# Patient Record
Sex: Male | Born: 1935
Health system: Southern US, Community
[De-identification: ages and names within clinical notes are randomized; demographics above are authoritative.]

## PROBLEM LIST (undated history)

## (undated) DIAGNOSIS — J449 Chronic obstructive pulmonary disease, unspecified: Secondary | ICD-10-CM

## (undated) DIAGNOSIS — J320 Chronic maxillary sinusitis: Secondary | ICD-10-CM

## (undated) DIAGNOSIS — K439 Ventral hernia without obstruction or gangrene: Secondary | ICD-10-CM

## (undated) DIAGNOSIS — F32A Depression, unspecified: Secondary | ICD-10-CM

## (undated) DIAGNOSIS — T7840XA Allergy, unspecified, initial encounter: Secondary | ICD-10-CM

## (undated) DIAGNOSIS — F419 Anxiety disorder, unspecified: Secondary | ICD-10-CM

## (undated) DIAGNOSIS — D649 Anemia, unspecified: Secondary | ICD-10-CM

## (undated) DIAGNOSIS — F0280 Dementia in other diseases classified elsewhere without behavioral disturbance: Secondary | ICD-10-CM

## (undated) DIAGNOSIS — F329 Major depressive disorder, single episode, unspecified: Secondary | ICD-10-CM

## (undated) DIAGNOSIS — E785 Hyperlipidemia, unspecified: Secondary | ICD-10-CM

## (undated) DIAGNOSIS — N189 Chronic kidney disease, unspecified: Secondary | ICD-10-CM

## (undated) DIAGNOSIS — N289 Disorder of kidney and ureter, unspecified: Secondary | ICD-10-CM

## (undated) DIAGNOSIS — I10 Essential (primary) hypertension: Secondary | ICD-10-CM

## (undated) DIAGNOSIS — N400 Enlarged prostate without lower urinary tract symptoms: Secondary | ICD-10-CM

## (undated) DIAGNOSIS — G4730 Sleep apnea, unspecified: Secondary | ICD-10-CM

## (undated) DIAGNOSIS — R609 Edema, unspecified: Secondary | ICD-10-CM

## (undated) DIAGNOSIS — G309 Alzheimer's disease, unspecified: Secondary | ICD-10-CM

## (undated) HISTORY — DX: Sleep apnea, unspecified: G47.30

## (undated) HISTORY — DX: Disorder of kidney and ureter, unspecified: N28.9

## (undated) HISTORY — DX: Anxiety disorder, unspecified: F41.9

## (undated) HISTORY — DX: Dementia in other diseases classified elsewhere, unspecified severity, without behavioral disturbance, psychotic disturbance, mood disturbance, and anxiety: F02.80

## (undated) HISTORY — DX: Anemia, unspecified: D64.9

## (undated) HISTORY — DX: Hyperlipidemia, unspecified: E78.5

## (undated) HISTORY — DX: Alzheimer's disease, unspecified: G30.9

## (undated) HISTORY — DX: Allergy, unspecified, initial encounter: T78.40XA

## (undated) HISTORY — DX: Edema, unspecified: R60.9

## (undated) HISTORY — DX: Depression, unspecified: F32.A

## (undated) HISTORY — DX: Benign prostatic hyperplasia without lower urinary tract symptoms: N40.0

## (undated) HISTORY — DX: Chronic maxillary sinusitis: J32.0

## (undated) HISTORY — DX: Major depressive disorder, single episode, unspecified: F32.9

## (undated) HISTORY — DX: Hypercalcemia: E83.52

## (undated) HISTORY — DX: Ventral hernia without obstruction or gangrene: K43.9

## (undated) HISTORY — PX: HERNIA REPAIR: SHX51

---

## 2000-07-08 HISTORY — PX: EXPLORATORY LAPAROTOMY: SUR591

## 2001-06-10 ENCOUNTER — Encounter: Payer: Self-pay | Admitting: Emergency Medicine

## 2001-06-11 ENCOUNTER — Inpatient Hospital Stay (HOSPITAL_COMMUNITY): Admission: EM | Admit: 2001-06-11 | Discharge: 2001-06-23 | Payer: Self-pay | Admitting: Emergency Medicine

## 2001-06-12 ENCOUNTER — Encounter: Payer: Self-pay | Admitting: Internal Medicine

## 2001-06-14 ENCOUNTER — Encounter: Payer: Self-pay | Admitting: Internal Medicine

## 2001-06-15 ENCOUNTER — Encounter: Payer: Self-pay | Admitting: Internal Medicine

## 2001-06-16 ENCOUNTER — Encounter: Payer: Self-pay | Admitting: Internal Medicine

## 2001-06-17 ENCOUNTER — Encounter: Payer: Self-pay | Admitting: Internal Medicine

## 2002-09-20 ENCOUNTER — Ambulatory Visit (HOSPITAL_COMMUNITY): Admission: RE | Admit: 2002-09-20 | Discharge: 2002-09-20 | Payer: Self-pay | Admitting: Gastroenterology

## 2002-09-20 ENCOUNTER — Encounter (INDEPENDENT_AMBULATORY_CARE_PROVIDER_SITE_OTHER): Payer: Self-pay

## 2003-08-17 ENCOUNTER — Ambulatory Visit (HOSPITAL_COMMUNITY): Admission: RE | Admit: 2003-08-17 | Discharge: 2003-08-17 | Payer: Self-pay | Admitting: General Surgery

## 2003-08-17 ENCOUNTER — Encounter (INDEPENDENT_AMBULATORY_CARE_PROVIDER_SITE_OTHER): Payer: Self-pay | Admitting: Specialist

## 2003-08-17 ENCOUNTER — Ambulatory Visit (HOSPITAL_BASED_OUTPATIENT_CLINIC_OR_DEPARTMENT_OTHER): Admission: RE | Admit: 2003-08-17 | Discharge: 2003-08-17 | Payer: Self-pay | Admitting: General Surgery

## 2009-10-04 ENCOUNTER — Inpatient Hospital Stay (HOSPITAL_COMMUNITY): Admission: RE | Admit: 2009-10-04 | Discharge: 2009-10-16 | Payer: Self-pay | Admitting: General Surgery

## 2010-05-28 ENCOUNTER — Encounter: Admission: RE | Admit: 2010-05-28 | Discharge: 2010-05-28 | Payer: Self-pay | Admitting: Internal Medicine

## 2010-09-26 LAB — GLUCOSE, CAPILLARY
Glucose-Capillary: 111 mg/dL — ABNORMAL HIGH (ref 70–99)
Glucose-Capillary: 114 mg/dL — ABNORMAL HIGH (ref 70–99)
Glucose-Capillary: 116 mg/dL — ABNORMAL HIGH (ref 70–99)
Glucose-Capillary: 122 mg/dL — ABNORMAL HIGH (ref 70–99)
Glucose-Capillary: 122 mg/dL — ABNORMAL HIGH (ref 70–99)
Glucose-Capillary: 132 mg/dL — ABNORMAL HIGH (ref 70–99)
Glucose-Capillary: 134 mg/dL — ABNORMAL HIGH (ref 70–99)
Glucose-Capillary: 134 mg/dL — ABNORMAL HIGH (ref 70–99)
Glucose-Capillary: 135 mg/dL — ABNORMAL HIGH (ref 70–99)
Glucose-Capillary: 137 mg/dL — ABNORMAL HIGH (ref 70–99)
Glucose-Capillary: 144 mg/dL — ABNORMAL HIGH (ref 70–99)
Glucose-Capillary: 145 mg/dL — ABNORMAL HIGH (ref 70–99)
Glucose-Capillary: 146 mg/dL — ABNORMAL HIGH (ref 70–99)
Glucose-Capillary: 149 mg/dL — ABNORMAL HIGH (ref 70–99)
Glucose-Capillary: 153 mg/dL — ABNORMAL HIGH (ref 70–99)
Glucose-Capillary: 157 mg/dL — ABNORMAL HIGH (ref 70–99)
Glucose-Capillary: 159 mg/dL — ABNORMAL HIGH (ref 70–99)
Glucose-Capillary: 160 mg/dL — ABNORMAL HIGH (ref 70–99)
Glucose-Capillary: 160 mg/dL — ABNORMAL HIGH (ref 70–99)
Glucose-Capillary: 161 mg/dL — ABNORMAL HIGH (ref 70–99)
Glucose-Capillary: 177 mg/dL — ABNORMAL HIGH (ref 70–99)
Glucose-Capillary: 185 mg/dL — ABNORMAL HIGH (ref 70–99)
Glucose-Capillary: 194 mg/dL — ABNORMAL HIGH (ref 70–99)
Glucose-Capillary: 195 mg/dL — ABNORMAL HIGH (ref 70–99)
Glucose-Capillary: 203 mg/dL — ABNORMAL HIGH (ref 70–99)
Glucose-Capillary: 214 mg/dL — ABNORMAL HIGH (ref 70–99)
Glucose-Capillary: 230 mg/dL — ABNORMAL HIGH (ref 70–99)
Glucose-Capillary: 235 mg/dL — ABNORMAL HIGH (ref 70–99)
Glucose-Capillary: 238 mg/dL — ABNORMAL HIGH (ref 70–99)
Glucose-Capillary: 271 mg/dL — ABNORMAL HIGH (ref 70–99)
Glucose-Capillary: 64 mg/dL — ABNORMAL LOW (ref 70–99)
Glucose-Capillary: 81 mg/dL (ref 70–99)
Glucose-Capillary: 87 mg/dL (ref 70–99)

## 2010-09-26 LAB — URINALYSIS, MICROSCOPIC ONLY
Bilirubin Urine: NEGATIVE
Hgb urine dipstick: NEGATIVE
Nitrite: NEGATIVE
Specific Gravity, Urine: 1.014 (ref 1.005–1.030)
Urobilinogen, UA: 0.2 mg/dL (ref 0.0–1.0)
pH: 5 (ref 5.0–8.0)

## 2010-09-26 LAB — BLOOD GAS, ARTERIAL
Acid-base deficit: 0.9 mmol/L (ref 0.0–2.0)
Bicarbonate: 23.7 mEq/L (ref 20.0–24.0)
Bicarbonate: 28.5 mEq/L — ABNORMAL HIGH (ref 20.0–24.0)
Drawn by: 328211
O2 Content: 2 L/min
O2 Saturation: 95.9 %
Patient temperature: 98.6
Patient temperature: 98.6
TCO2: 25 mmol/L (ref 0–100)
pH, Arterial: 7.335 — ABNORMAL LOW (ref 7.350–7.450)
pO2, Arterial: 82.1 mmHg (ref 80.0–100.0)

## 2010-09-26 LAB — CBC
HCT: 33.8 % — ABNORMAL LOW (ref 39.0–52.0)
HCT: 35.8 % — ABNORMAL LOW (ref 39.0–52.0)
Hemoglobin: 12.1 g/dL — ABNORMAL LOW (ref 13.0–17.0)
Hemoglobin: 12.3 g/dL — ABNORMAL LOW (ref 13.0–17.0)
MCV: 95.3 fL (ref 78.0–100.0)
MCV: 95.7 fL (ref 78.0–100.0)
Platelets: 174 10*3/uL (ref 150–400)
RBC: 3.23 MIL/uL — ABNORMAL LOW (ref 4.22–5.81)
RBC: 3.35 MIL/uL — ABNORMAL LOW (ref 4.22–5.81)
RBC: 3.54 MIL/uL — ABNORMAL LOW (ref 4.22–5.81)
RBC: 3.74 MIL/uL — ABNORMAL LOW (ref 4.22–5.81)
RDW: 14.1 % (ref 11.5–15.5)
WBC: 10.7 10*3/uL — ABNORMAL HIGH (ref 4.0–10.5)
WBC: 12.1 10*3/uL — ABNORMAL HIGH (ref 4.0–10.5)
WBC: 5.2 10*3/uL (ref 4.0–10.5)
WBC: 7.2 10*3/uL (ref 4.0–10.5)
WBC: 7.9 10*3/uL (ref 4.0–10.5)

## 2010-09-26 LAB — BASIC METABOLIC PANEL
BUN: 36 mg/dL — ABNORMAL HIGH (ref 6–23)
CO2: 28 mEq/L (ref 19–32)
Calcium: 8.3 mg/dL — ABNORMAL LOW (ref 8.4–10.5)
Calcium: 8.7 mg/dL (ref 8.4–10.5)
Calcium: 8.7 mg/dL (ref 8.4–10.5)
Calcium: 8.8 mg/dL (ref 8.4–10.5)
Calcium: 9.5 mg/dL (ref 8.4–10.5)
Chloride: 102 mEq/L (ref 96–112)
Chloride: 102 mEq/L (ref 96–112)
Chloride: 105 mEq/L (ref 96–112)
Chloride: 106 mEq/L (ref 96–112)
Chloride: 108 mEq/L (ref 96–112)
Creatinine, Ser: 1.74 mg/dL — ABNORMAL HIGH (ref 0.4–1.5)
Creatinine, Ser: 2.18 mg/dL — ABNORMAL HIGH (ref 0.4–1.5)
GFR calc Af Amer: 33 mL/min — ABNORMAL LOW (ref >60)
GFR calc Af Amer: 36 mL/min — ABNORMAL LOW (ref >60)
GFR calc Af Amer: 47 mL/min — ABNORMAL LOW (ref >60)
GFR calc Af Amer: 58 mL/min — ABNORMAL LOW (ref >60)
GFR calc Af Amer: >60 mL/min (ref >60)
GFR calc Af Amer: >60 mL/min (ref >60)
GFR calc non Af Amer: 27 mL/min — ABNORMAL LOW (ref >60)
GFR calc non Af Amer: 39 mL/min — ABNORMAL LOW (ref >60)
GFR calc non Af Amer: 54 mL/min — ABNORMAL LOW (ref >60)
Glucose, Bld: 114 mg/dL — ABNORMAL HIGH (ref 70–99)
Glucose, Bld: 123 mg/dL — ABNORMAL HIGH (ref 70–99)
Glucose, Bld: 132 mg/dL — ABNORMAL HIGH (ref 70–99)
Glucose, Bld: 137 mg/dL — ABNORMAL HIGH (ref 70–99)
Glucose, Bld: 55 mg/dL — ABNORMAL LOW (ref 70–99)
Potassium: 3.8 mEq/L (ref 3.5–5.1)
Potassium: 4 mEq/L (ref 3.5–5.1)
Potassium: 4.1 mEq/L (ref 3.5–5.1)
Potassium: 4.3 mEq/L (ref 3.5–5.1)
Potassium: 4.6 mEq/L (ref 3.5–5.1)
Potassium: 4.9 mEq/L (ref 3.5–5.1)
Sodium: 136 mEq/L (ref 135–145)
Sodium: 138 mEq/L (ref 135–145)
Sodium: 139 mEq/L (ref 135–145)
Sodium: 140 mEq/L (ref 135–145)
Sodium: 141 mEq/L (ref 135–145)

## 2010-09-26 LAB — BRAIN NATRIURETIC PEPTIDE: Pro B Natriuretic peptide (BNP): 39 pg/mL (ref 0.0–100.0)

## 2010-09-30 LAB — GLUCOSE, CAPILLARY
Glucose-Capillary: 172 mg/dL — ABNORMAL HIGH (ref 70–99)
Glucose-Capillary: 191 mg/dL — ABNORMAL HIGH (ref 70–99)
Glucose-Capillary: 193 mg/dL — ABNORMAL HIGH (ref 70–99)
Glucose-Capillary: 224 mg/dL — ABNORMAL HIGH (ref 70–99)

## 2010-09-30 LAB — DIFFERENTIAL
Basophils Relative: 1 % (ref 0–1)
Eosinophils Relative: 5 % (ref 0–5)
Monocytes Absolute: 0.8 10*3/uL (ref 0.1–1.0)
Monocytes Relative: 11 % (ref 3–12)
Neutro Abs: 3.9 10*3/uL (ref 1.7–7.7)

## 2010-09-30 LAB — COMPREHENSIVE METABOLIC PANEL
AST: 20 U/L (ref 0–37)
Albumin: 3.8 g/dL (ref 3.5–5.2)
Alkaline Phosphatase: 39 U/L (ref 39–117)
BUN: 28 mg/dL — ABNORMAL HIGH (ref 6–23)
Chloride: 100 mEq/L (ref 96–112)
GFR calc Af Amer: 57 mL/min — ABNORMAL LOW (ref >60)
Potassium: 3.7 mEq/L (ref 3.5–5.1)
Sodium: 140 mEq/L (ref 135–145)
Total Protein: 7 g/dL (ref 6.0–8.3)

## 2010-09-30 LAB — MRSA PCR SCREENING: MRSA by PCR: NEGATIVE

## 2010-09-30 LAB — CBC
HCT: 40.5 % (ref 39.0–52.0)
Platelets: 164 10*3/uL (ref 150–400)
RBC: 3.82 MIL/uL — ABNORMAL LOW (ref 4.22–5.81)
RDW: 13.7 % (ref 11.5–15.5)
WBC: 11.1 10*3/uL — ABNORMAL HIGH (ref 4.0–10.5)
WBC: 7.5 10*3/uL (ref 4.0–10.5)

## 2010-09-30 LAB — ABO/RH: ABO/RH(D): A POS

## 2010-09-30 LAB — BASIC METABOLIC PANEL
Calcium: 8.9 mg/dL (ref 8.4–10.5)
Creatinine, Ser: 1.8 mg/dL — ABNORMAL HIGH (ref 0.4–1.5)
GFR calc Af Amer: 45 mL/min — ABNORMAL LOW (ref >60)
GFR calc non Af Amer: 37 mL/min — ABNORMAL LOW (ref >60)

## 2010-09-30 LAB — TYPE AND SCREEN

## 2010-11-23 NOTE — Op Note (Signed)
   NAME:  Ian [name], Ian [name] NO.:  [id number]   MEDICAL RECORD NO.:  [id number]                   PATIENT TYPE:  AMB   LOCATION:  ENDO                                 FACILITY:  [facility]   PHYSICIAN:  Earle Gell, M.D.                DATE OF BIRTH:  April 23, 1936   DATE OF PROCEDURE:  09/20/2002  DATE OF DISCHARGE:                                 OPERATIVE REPORT   REFERRING PHYSICIAN:  Sherren Kerns. Pamella Pert, M.D.   PROCEDURE:  Colonoscopy with rectal polypectomy.   PROCEDURE INDICATION:  [name] is a 75 year old male, born [birth year].  [name] is scheduled to undergo his first screening colonoscopy  with polypectomy to prevent colon cancer.   ENDOSCOPIST:  Garlan Fair, M.D.   PREMEDICATION:  Versed 5 mg, Demerol 50 mg.   ENDOSCOPE:  Olympus adult colonoscope.   DESCRIPTION OF PROCEDURE:  After obtaining informed consent, [name] was  placed in the left lateral decubitus position.  I administered intravenous  Demerol and intravenous Versed to achieve conscious sedation for the  procedure.  The patient's blood pressure, oxygen saturation, and cardiac  rhythm were monitored throughout the procedure and documented in the medical  record.   Anal inspection was normal.  Digital rectal examination revealed a small,  nonnodular prostate.  The Olympus adult colonoscope was introduced into the  rectum and easily advanced to the cecum.  Colonic preparation for the exam  today was excellent.   RECTUM:  From the proximal rectum, a 1 mm sessile polyp was removed with the  hot biopsy forceps.  SIGMOID COLON AND DESCENDING COLON:  Normal.  SPLENIC FLEXURE:  Normal.  TRANSVERSE COLON:  Normal.  HEPATIC FLEXURE:  Normal.  ASCENDING COLON:  Normal.  CECUM AND ILEOCECAL VALVE:  Normal.    ASSESSMENT:  A 1 mm sessile polyp was removed from the proximal rectum;  otherwise, normal proctocolonoscopy to the cecum.                 Earle Gell, M.D.    MJ/MEDQ  D:  09/20/2002  T:  09/20/2002  Job:  [id number]   cc:   Sherren Kerns. Pamella Pert, M.D.  [address]  [place]  [place] [address]  Fax: [phone]

## 2010-11-23 NOTE — Discharge Summary (Signed)
[place]  Patient:    Ian [name], Ian [name] Visit Number: [id number] MRN: [id number]          Service Type: MED Location: [phone] Attending Physician:  Harl Bowie Dictated by:   Coralie Keens, M.D. Admit Date:  06/10/2001 Discharge Date: 06/23/2001                             Discharge Summary  SUMMARY OF HISTORY:  Ian [name] is a 75 year old gentleman who was admitted by the medical service and hospitalists for exacerbation of COPD on June 10, 2001.  He was treated for this by the hospitalists with breathing treatments, IV antibiotics, steroids, etc.  During this time, he developed an ileus and increasing abdominal distention.  Therefore, on June 16, 2001 I was consulted from a general surgical perspective.  At this point, the patient was having bowel movements and flatus.  His abdomen was soft and only mildly distended with minimal tenderness.  He had had abdominal x-rays showing the possibility of a bowel obstruction, although I thought this was more secondary to an ileus.  At this point, I placed him on Reglan and Dulcolax suppository and he began having bowel movements.  His abdominal x-rays, however, continued to be read as continued bowel obstruction, which was read by them as high grade.  Because of his slow improvement, a CAT scan was ordered and on December 12 he underwent a CAT scan, which showed him to have, by the radiologists reading, a high-grade distal small bowel obstruction with ischemic changes.  Based on these findings, I had no choice but to take the patient to the operating room for exploratory laparotomy.  At exploration, the patient was found to have an ileus with no evidence of bowel obstruction.  He did well postoperatively and was taken to a regular surgical floor.  The hospitalists continued to follow him from a pulmonary standpoint, at which he had no problems.  His ileus slowly began to resolve and his  NG was removed and he was placed on clear liquids on postoperative day #3.  By postoperative day #4, he was having bowel movements and was tolerating liquids.  At this point, his PCA was removed.  He was given an enema to remove some of the large stool in his colon.  He continued to improve from this point and was ambulating well, and by December 17 he was doing very well from both the pulmonary and abdominal standpoint.  His incision was healing well and he was having regular bowel movements.  He was tolerating a regular diet.  The decision was made, after review from the hospitalists, to discharge the patient to home.  DISCHARGE DIAGNOSES: 1. Chronic obstructive pulmonary disease exacerbation. 2. Ileus, status post exploratory laparotomy.  DISCHARGE ACTIVITY:  He is to do no heavy lifting for approximately five more weeks.  DISCHARGE MEDICATIONS:  Medications include his current home medications, which included enalapril, Paxil, and Lipitor.  He is now on Norvasc, ______. They have got him on a prednisone taper and Atrovent.  He is to hold his Glucophage.  DISCHARGE DIET:  He will continue a low-fat ADA diet.  FOLLOWUP:  He will follow up in my office in seven days post discharge.  He will follow up with Dr. Pamella Pert, his primary care physician, on Friday, June 26, 2001. Dictated by:   Coralie Keens, M.D. Attending Physician:  Harl Bowie DD:  07/02/01 TD:  07/03/01 Job: [id number]

## 2010-11-23 NOTE — Op Note (Signed)
[place]  Patient:    Ian [name], Ian [name] Visit Number: [id number] MRN: [id number]          Service Type: MED Location: [phone] Attending Physician:  Harl Bowie Dictated by:   Coralie Keens, M.D. Proc. Date: 06/18/01 Admit Date:  06/10/2001                             Operative Report  PREOPERATIVE DIAGNOSIS:  Small bowel obstruction.  POSTOPERATIVE DIAGNOSIS:  Ileus.  PROCEDURE:  Exploratory laparotomy.  SURGEON: Coralie Keens, M.D.  ANESTHESIA:  General endotracheal anesthesia.  INDICATION:  Ian [name] is a 75 year old gentleman who was admitted with an exacerbation of COPD on December 5.  He developed abdominal distention after this and had a CT scan of the abdomen on December 11, which showed findings suspicious for a high-grade bowel obstruction with ischemic changes of the bowel.  Given this finding, a decision made to proceed to the operating room for exploration.  FINDINGS:  The patient was found to have dilated small bowel but no evidence of obstruction.  DESCRIPTION OF PROCEDURE:  The patient was brought to the operating room, identified as Ian [name].  He was placed supine upon the operating table and general anesthesia was induced.  His abdomen was then prepped and draped in the usual sterile fashion.  Using a #10 blade, a midline incision was then created.  The incision was carried down through the fascia with the electrocautery.  The peritoneum was then opened the entire length of the incision.  The patient was found to have a small umbilical hernia with omentum stuck in this.  Electrocautery was used to free this up.  The abdomen was then explored.  The patient was found to have a mild amount of ascitic fluid.  The small bowel appeared dilated in the midportion.  Small bowel was then completely visualized going from the ligament of Treitz to the terminal ileum. No evidence of adhesions or obstruction was  identified.  The cecum, ascending, transverse, descending, and sigmoid colon were then examined, and no obstruction was found here, either.  The liver was also normal.  Again the small was run, and again no obstruction was identified.  At that point the midline fascia was closed with running #1 Prolene suture.  The umbilical hernia defect was repaired with the closure.  The subcutaneous layer was then irrigated and closed with skin staples.  The patient tolerated the procedure well.  All sponge, needle, and instrument counts were correct at the end of the procedure.  The patient was then extubated in the operating room and taken in stable condition to the recovery room. Dictated by:   Coralie Keens, M.D. Attending Physician:  Harl Bowie DD:  06/18/01 TD:  06/18/01 Job: [id number]

## 2011-06-28 DIAGNOSIS — H5010 Unspecified exotropia: Secondary | ICD-10-CM | POA: Insufficient documentation

## 2011-06-28 DIAGNOSIS — H348392 Tributary (branch) retinal vein occlusion, unspecified eye, stable: Secondary | ICD-10-CM | POA: Insufficient documentation

## 2011-10-21 ENCOUNTER — Other Ambulatory Visit: Payer: Self-pay | Admitting: Internal Medicine

## 2011-10-21 ENCOUNTER — Ambulatory Visit
Admission: RE | Admit: 2011-10-21 | Discharge: 2011-10-21 | Disposition: A | Discharge status: Home / Self Care | Payer: Medicare Other | Source: Ambulatory Visit | Attending: Internal Medicine | Admitting: Internal Medicine

## 2011-10-21 DIAGNOSIS — R05 Cough: Secondary | ICD-10-CM

## 2011-10-28 ENCOUNTER — Encounter (HOSPITAL_COMMUNITY): Payer: Self-pay | Admitting: *Deleted

## 2011-10-28 ENCOUNTER — Emergency Department (HOSPITAL_COMMUNITY)
Admission: EM | Admit: 2011-10-28 | Discharge: 2011-10-28 | Disposition: A | Discharge status: Home / Self Care | Payer: Medicare Other | Source: Home / Self Care | Attending: Family Medicine | Admitting: Family Medicine

## 2011-10-28 ENCOUNTER — Emergency Department (INDEPENDENT_AMBULATORY_CARE_PROVIDER_SITE_OTHER): Payer: Medicare Other

## 2011-10-28 DIAGNOSIS — J45909 Unspecified asthma, uncomplicated: Secondary | ICD-10-CM

## 2011-10-28 HISTORY — DX: Essential (primary) hypertension: I10

## 2011-10-28 HISTORY — DX: Chronic obstructive pulmonary disease, unspecified: J44.9

## 2011-10-28 MED ORDER — FLUTICASONE PROPIONATE 50 MCG/ACT NA SUSP: 1.0000 | Freq: Two times a day (BID) | NASAL | Status: DC

## 2011-10-28 MED ORDER — DEXTROMETHORPHAN POLISTIREX 30 MG/5ML PO LQCR: 60.0000 mg | ORAL | Status: AC | PRN

## 2011-10-28 MED ORDER — CETIRIZINE HCL 10 MG PO TABS: 10.0000 mg | ORAL_TABLET | Freq: Every day | ORAL | Status: DC

## 2011-10-28 NOTE — ED Provider Notes (Signed)
History     CSN: [id number]  Arrival date & time 10/28/11  1300   First MD Initiated Contact with Patient 10/28/11 1520      Chief Complaint  Patient presents with  . Cough    (Consider location/radiation/quality/duration/timing/severity/associated sxs/prior treatment) Patient is a 76 y.o. male presenting with cough. The history is provided by the patient and the spouse.  Cough This is a new problem. The current episode started more than 1 week ago (given z-pak by lmd, sx continue.). The problem has not changed since onset.The cough is non-productive. There has been no fever. Associated symptoms include rhinorrhea. He is not a smoker. His past medical history is significant for COPD.    Past Medical History  Diagnosis Date  . Diabetes mellitus   . Hypertension   . COPD (chronic obstructive pulmonary disease)     Past Surgical History  Procedure Date  . Hernia repair     History reviewed. No pertinent family history.  History  Substance Use Topics  . Smoking status: Not on file  . Smokeless tobacco: Not on file  . Alcohol Use:       Review of Systems  Constitutional: Negative.   HENT: Positive for congestion, rhinorrhea, sneezing and postnasal drip.   Respiratory: Positive for cough.   Cardiovascular: Negative.   Gastrointestinal: Negative.     Allergies  Review of patient's allergies indicates no known allergies.  Home Medications   Current Outpatient Rx  Name Route Sig Dispense Refill  . ALBUTEROL SULFATE HFA 108 (90 BASE) MCG/ACT IN AERS Inhalation Inhale 2 puffs into the lungs every 6 (six) hours as needed.    Marland Kitchen BENZONATATE 100 MG PO CAPS Oral Take 100 mg by mouth 3 (three) times daily as needed.    Marland Kitchen GLIMEPIRIDE 4 MG PO TABS Oral Take 4 mg by mouth daily before breakfast.    . LISINOPRIL 5 MG PO TABS Oral Take 5 mg by mouth daily.    Marland Kitchen PAROXETINE HCL 20 MG PO TABS Oral Take 20 mg by mouth every morning.    . TRAZODONE HCL 150 MG PO TABS Oral Take 150  mg by mouth at bedtime.    Marland Kitchen CETIRIZINE HCL 10 MG PO TABS Oral Take 1 tablet (10 mg total) by mouth daily. One tab daily for allergies 30 tablet 1  . DEXTROMETHORPHAN POLISTIREX ER 30 MG/5ML PO LQCR Oral Take 10 mLs (60 mg total) by mouth as needed for cough. 89 mL 0  . FLUTICASONE PROPIONATE 50 MCG/ACT NA SUSP Nasal Place 1 spray into the nose 2 (two) times daily. 1 g 2    BP 146/67  Pulse 72  Temp(Src) 97.8 F (36.6 C) (Oral)  Resp 18  SpO2 98%  Physical Exam  Nursing note and vitals reviewed. Constitutional: He is oriented to person, place, and time. He appears well-developed and well-nourished.  HENT:  Head: Normocephalic.  Right Ear: External ear normal.  Left Ear: External ear normal.  Mouth/Throat: Oropharynx is clear and moist.  Eyes: Pupils are equal, round, and reactive to light.  Neck: Normal range of motion. Neck supple.  Cardiovascular: Normal rate and normal heart sounds.   Pulmonary/Chest: Effort normal and breath sounds normal.  Musculoskeletal: He exhibits no edema.  Lymphadenopathy:    He has no cervical adenopathy.  Neurological: He is alert and oriented to person, place, and time.  Skin: Skin is warm and dry.  Psychiatric: He has a normal mood and affect.    ED Course  Procedures (including critical care time)  Labs Reviewed - No data to display Dg Chest 2 View  10/28/2011  *RADIOLOGY REPORT*  Clinical Data: Cough.  Shortness of breath.  CHEST - 2 VIEW  Comparison: 10/21/2011  Findings: Heart size is normal.  Mediastinal shadows are unremarkable except for calcification of the thoracic aorta.  I think there is central bronchial thickening consistent with bronchitis.  No infiltrate, collapse or effusion.  No significant bony finding.  IMPRESSION: Bronchitis.  No consolidation or collapse.  Original Report Authenticated By: Jules Schick, M.D.     1. Bronchitis, allergic       MDM  X-rays reviewed and report per radiologist.         Billy Fischer, MD 10/28/11 [phone]

## 2011-10-28 NOTE — ED Notes (Signed)
pT  FINISHED  ROUND  OF  ANTI  BIOTICS  4  DAYS  AGO    HE WAS  ALSO  RX     ALBUTEROL   AS  WELL    AYT  THIS  TIME  HE  REPORTS  COUGH  AND      SHORTNESS OF  BREATH   WHICH  HE  REPORTS  HE  HAS  HAD  FOR 5  WEEKS  -  HE  IS  SITTING UPRIGHT ON EXAM TABLE  SPEAKING IN  COMPLETE  SENTANCES   WIFE  AT THE  BEDSIDE

## 2011-11-11 ENCOUNTER — Encounter: Payer: Self-pay | Admitting: Pulmonary Disease

## 2011-11-11 ENCOUNTER — Ambulatory Visit (INDEPENDENT_AMBULATORY_CARE_PROVIDER_SITE_OTHER): Payer: Medicare Other | Admitting: Pulmonary Disease

## 2011-11-11 VITALS — BP 160/78 | HR 66 | Temp 98.6°F | Ht 67.0 in | Wt 211.4 lb

## 2011-11-11 DIAGNOSIS — R05 Cough: Secondary | ICD-10-CM

## 2011-11-11 MED ORDER — MOMETASONE FUROATE 50 MCG/ACT NA SUSP: 2.0000 | Freq: Every day | NASAL | Status: DC

## 2011-11-11 NOTE — Patient Instructions (Signed)
Nasal irrigation (saline nasal spray) daily Nasonex two sprays each nostril daily for two weeks, then as needed Zyrtec daily for 5 days, then as needed Albuterol two puffs as needed for cough, wheeze, or chest congestion Finish prednisone prescription from Dr. Mariea Clonts Will schedule breathing test (PFT) Salt water gargles as needed Sugarless candy to help keep your mouth moist Sip water when you have urge to cough Follow up in 3 to 4 weeks

## 2011-11-11 NOTE — Assessment & Plan Note (Addendum)
This developed after recent respiratory infection.  He has some improvement with albuterol and prednisone therapy.  Spirometry today was suggestive of mixed restrictive and obstructive problem.  He has a reported history of COPD, and is a former smoker.  His recent chest xray showed bronchitic changes.  He does also have sinus congestion, seasonal allergies, and post-nasal drip.   I have advised him to use nasal irrigation and nasonex on a regular basis.  He can use salt water gargles, and sugarless candy for his throat.  He is to use zyrtec for the next several days, and then as needed.  He is to finish prednisone, and continue prn albuterol.  I don't think he needs additional antibiotics.  Will arrange for full PFT's to further assess obstructive/restrictive process.  Depending on results will determine if additional inhaler therapy or chest imaging studies are needed.  Of note is that he is on lisinopril.  If his symptoms do not improve with above interventions, then may need to consider trial off ACE inhibitor.  He does not have symptoms to suggest reflux.  If he cough persists, he may need further GI evaluation, but I don't think this is needed at present.

## 2011-11-11 NOTE — Progress Notes (Signed)
Chief Complaint  Patient presents with  . Advice Only    refer Dr. Mariea Clonts. Pt c/o cough w/ green-brown-white phlem, sob at rest and w/ exertion occasionally, wheezing, chest tightness    History of Present Illness: Ian [name] is a 76 y.o. male former smoker for evaluation of chronic cough.  His cough started about 6 weeks ago.  His wife got a cold, and he thinks he caught this from her.  His symptoms started in his sinuses.  He has been getting sinus congestion with post-nasal drip. He was also feverish when this started.  His feverish feeling resolved after he was treated with zithromax.  He also had some improvement with his cough, but not compete resolution.  He has been getting a globus sensation, but denies reflux.  His symptoms then settled into his chest.  He has been getting some wheeze, and gets more winded with activity.  His cough is usually dry, but occasionally productive of clear sputum.  He denies hemoptysis or chest pain.  He was started on albuterol and then prednisone.  These have helped.  He was seen several years ago by pulmonary doctor in [place], and told he has COPD and asthma.  He has used albuterol intermittently.  He also gets allergies, especially around grass and cats.  He was started on zyrtec and nasonex recently with some benefit.  He does not use these on a regular basis.    He has been using lisinopril.  He is not sure how long he has been using this.    There is no prior history of pneumonia or tuberculosis.  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  He quit smoking in 1990.   Past Medical History  Diagnosis Date  . Diabetes mellitus   . Hypertension   . COPD (chronic obstructive pulmonary disease)   . Kidney disease     mild  . Hyperlipidemia     Past Surgical History  Procedure Date  . Hernia repair     Current Outpatient Prescriptions on File Prior to Visit  Medication Sig Dispense Refill  . albuterol  (PROVENTIL HFA;VENTOLIN HFA) 108 (90 BASE) MCG/ACT inhaler Inhale 2 puffs into the lungs every 6 (six) hours as needed.      . benzonatate (TESSALON) 100 MG capsule Take 100 mg by mouth 3 (three) times daily as needed.      . cetirizine (ZYRTEC) 10 MG tablet Take 1 tablet (10 mg total) by mouth daily. One tab daily for allergies  30 tablet  1  . fluticasone (FLONASE) 50 MCG/ACT nasal spray Place 1 spray into the nose 2 (two) times daily.  1 g  2  . glimepiride (AMARYL) 4 MG tablet Take 4 mg by mouth daily before breakfast.      . lisinopril (PRINIVIL,ZESTRIL) 5 MG tablet Take 5 mg by mouth daily.      Marland Kitchen PARoxetine (PAXIL) 20 MG tablet Take 20 mg by mouth every morning.      . traZODone (DESYREL) 150 MG tablet Take 150 mg by mouth at bedtime.        No Known Allergies  family history includes Heart disease in his father and Pancreatic cancer in his sister.   reports that he quit smoking about 23 years ago. He does not have any smokeless tobacco history on file. He reports that he does not drink alcohol or use illicit drugs.  Review of Systems  Constitutional: Negative for fever, appetite  change and unexpected weight change.  HENT: Positive for sore throat and sneezing. Negative for ear pain, congestion, trouble swallowing, dental problem and sinus pressure.   Respiratory: Positive for cough and shortness of breath.   Cardiovascular: Positive for chest pain. Negative for palpitations and leg swelling.  Gastrointestinal: Negative for abdominal pain.  Musculoskeletal: Negative for joint swelling.  Skin: Positive for color change. Negative for rash.  Neurological: Negative for headaches.  Psychiatric/Behavioral: Positive for dysphoric mood. The patient is nervous/anxious.     Physical Exam: BP 160/78  Pulse 66  Temp(Src) 98.6 F (37 C) (Oral)  Ht 5\' 7"  (1.702 m)  Wt 211 lb 6.4 oz (95.89 kg)  BMI 33.11 kg/m2  SpO2 93% Body mass index is 33.11 kg/(m^2).  General - Obese, no  distress HEENT - PERRLA, EOMI, narrow nasal angles, clear nasal discharge, no oral exudate, no LAN, b/l hearing aides Cardiac - s1s2 regular, no murmur, pulses symmetric Chest - good air entry, no wheeze/rales/dullness Abdomen - obese, soft, nontender, no organomegaly Extremities - no e/c/c Neurologic - normal strength, CN intact Skin - no rashes Psychiatric - normal mood, behavior  Dg Chest 2 View  10/28/2011  *RADIOLOGY REPORT*   Clinical Data: Cough.  Shortness of breath.   CHEST - 2 VIEW   Comparison: 10/21/2011   Findings: Heart size is normal.  Mediastinal shadows are unremarkable except for calcification of the thoracic aorta.  I think there is central bronchial thickening consistent with bronchitis.  No infiltrate, collapse or effusion.  No significant bony finding.   IMPRESSION: Bronchitis.  No consolidation or collapse. Original Report Authenticated By: Jules Schick, M.D.   Spirometry 11/11/11>>FEV1 1.41 (46%), FEV1% 70    Assessment/Plan:  Outpatient Encounter Prescriptions as of 11/11/2011  Medication Sig Dispense Refill  . albuterol (PROVENTIL HFA;VENTOLIN HFA) 108 (90 BASE) MCG/ACT inhaler Inhale 2 puffs into the lungs every 6 (six) hours as needed.      . benzonatate (TESSALON) 100 MG capsule Take 100 mg by mouth 3 (three) times daily as needed.      . cetirizine (ZYRTEC) 10 MG tablet Take 1 tablet (10 mg total) by mouth daily. One tab daily for allergies  30 tablet  1  . fluticasone (FLONASE) 50 MCG/ACT nasal spray Place 1 spray into the nose 2 (two) times daily.  1 g  2  . glimepiride (AMARYL) 4 MG tablet Take 4 mg by mouth daily before breakfast.      . lisinopril (PRINIVIL,ZESTRIL) 5 MG tablet Take 5 mg by mouth daily.      Marland Kitchen PARoxetine (PAXIL) 20 MG tablet Take 20 mg by mouth every morning.      . predniSONE (DELTASONE) 5 MG tablet Taper as directed      . traZODone (DESYREL) 150 MG tablet Take 150 mg by mouth at bedtime.        Ian [name] Pager:   [phone] 11/11/2011, 1:57 PM

## 2011-11-11 NOTE — Progress Notes (Deleted)
  Subjective:    Patient ID: Ian [name], male    DOB: 04-18-1936, 76 y.o.   MRN: [id number]  HPI    Review of Systems  Constitutional: Negative for fever, appetite change and unexpected weight change.  HENT: Positive for sore throat and sneezing. Negative for ear pain, congestion, trouble swallowing, dental problem and sinus pressure.   Respiratory: Positive for cough and shortness of breath.   Cardiovascular: Positive for chest pain. Negative for palpitations and leg swelling.  Gastrointestinal: Negative for abdominal pain.  Musculoskeletal: Negative for joint swelling.  Skin: Positive for color change. Negative for rash.  Neurological: Negative for headaches.  Psychiatric/Behavioral: Positive for dysphoric mood. The patient is nervous/anxious.        Objective:   Physical Exam        Assessment & Plan:

## 2011-11-29 ENCOUNTER — Ambulatory Visit (INDEPENDENT_AMBULATORY_CARE_PROVIDER_SITE_OTHER): Payer: Medicare Other | Admitting: Pulmonary Disease

## 2011-11-29 DIAGNOSIS — R059 Cough, unspecified: Secondary | ICD-10-CM

## 2011-11-29 DIAGNOSIS — R05 Cough: Secondary | ICD-10-CM

## 2011-11-29 LAB — PULMONARY FUNCTION TEST

## 2011-11-29 NOTE — Progress Notes (Signed)
PFT done today. 

## 2011-12-03 ENCOUNTER — Ambulatory Visit (INDEPENDENT_AMBULATORY_CARE_PROVIDER_SITE_OTHER): Payer: Medicare Other | Admitting: Pulmonary Disease

## 2011-12-03 ENCOUNTER — Encounter: Payer: Self-pay | Admitting: Pulmonary Disease

## 2011-12-03 VITALS — BP 148/80 | HR 69 | Temp 97.9°F | Ht 66.0 in | Wt 214.6 lb

## 2011-12-03 DIAGNOSIS — J309 Allergic rhinitis, unspecified: Secondary | ICD-10-CM | POA: Insufficient documentation

## 2011-12-03 DIAGNOSIS — R0982 Postnasal drip: Secondary | ICD-10-CM

## 2011-12-03 DIAGNOSIS — J449 Chronic obstructive pulmonary disease, unspecified: Secondary | ICD-10-CM

## 2011-12-03 DIAGNOSIS — J302 Other seasonal allergic rhinitis: Secondary | ICD-10-CM | POA: Insufficient documentation

## 2011-12-03 MED ORDER — MOMETASONE FURO-FORMOTEROL FUM 100-5 MCG/ACT IN AERO: 2.0000 | INHALATION_SPRAY | Freq: Two times a day (BID) | RESPIRATORY_TRACT | Status: DC

## 2011-12-03 NOTE — Assessment & Plan Note (Signed)
He can continue nasonex and claritin as needed.

## 2011-12-03 NOTE — Assessment & Plan Note (Signed)
He has moderate COPD with bronchodilator response on PFT.  Will have him start dulera 100/5 two puffs bid, and continue albuterol as needed.

## 2011-12-03 NOTE — Progress Notes (Signed)
Chief Complaint  Patient presents with  . Cough    pt states much improvement in wheezing and chest tightness as well as cough/congestion. Pt states that he still occasstionally coughs up the green-brown-white phleghm, and has sob at rest and w/ exertion     History of Present Illness: Ian [name] is a 76 y.o. male former smoker for evaluation of chronic cough 2nd to COPD with asthma, and post-nasal drip.  He has been feeling better.  His sinus are better.  He still has cough, but less than before.  He uses albuterol twice per day and this helps.  Past Medical History  Diagnosis Date  . Diabetes mellitus   . Hypertension   . COPD (chronic obstructive pulmonary disease)   . Kidney disease     mild  . Hyperlipidemia     Past Surgical History  Procedure Date  . Hernia repair     No Known Allergies  Physical Exam:  Blood pressure 148/80, pulse 69, temperature 97.9 F (36.6 C), temperature source Oral, height 5\' 6"  (1.676 m), weight 214 lb 9.6 oz (97.342 kg), SpO2 92.00%. Body mass index is 34.64 kg/(m^2). Wt Readings from Last 2 Encounters:  12/03/11 214 lb 9.6 oz (97.342 kg)  11/11/11 211 lb 6.4 oz (95.89 kg)    General - Obese, no distress  HEENT - PERRLA, EOMI, narrow nasal angles, clear nasal discharge, no oral exudate, no LAN, b/l hearing aides  Cardiac - s1s2 regular, no murmur, pulses symmetric  Chest - good air entry, no wheeze/rales/dullness  Abdomen - obese, soft, nontender, no organomegaly  Extremities - no e/c/c  Neurologic - normal strength, CN intact  Skin - no rashes  Psychiatric - normal mood, behavior   PFT 11/29/11>>FEV1 1.71 (67%), FEV1% 64, TLC 5.29 (92%), DLCO 84%, +BD  Assessment/Plan:  Outpatient Encounter Prescriptions as of 12/03/2011  Medication Sig Dispense Refill  . albuterol (PROVENTIL HFA;VENTOLIN HFA) 108 (90 BASE) MCG/ACT inhaler Inhale 2 puffs into the lungs every 6 (six) hours as needed.      Marland Kitchen BAYER CONTOUR TEST test strip 1 strip  Daily.      . benzonatate (TESSALON) 100 MG capsule Take 100 mg by mouth 3 (three) times daily as needed.      . cetirizine (ZYRTEC) 10 MG tablet Take 1 tablet (10 mg total) by mouth daily. One tab daily for allergies  30 tablet  1  . fluticasone (FLONASE) 50 MCG/ACT nasal spray Place 1 spray into the nose 2 (two) times daily.  1 g  2  . glimepiride (AMARYL) 4 MG tablet Take 4 mg by mouth daily before breakfast.      . lisinopril (PRINIVIL,ZESTRIL) 5 MG tablet Take 5 mg by mouth daily.      Marland Kitchen loratadine (CLARITIN) 10 MG tablet Take 10 mg by mouth daily as needed.      . mometasone (NASONEX) 50 MCG/ACT nasal spray Place 2 sprays into the nose daily.  17 g  11  . PARoxetine (PAXIL) 20 MG tablet Take 20 mg by mouth every morning.      . traZODone (DESYREL) 150 MG tablet Take 150 mg by mouth at bedtime.      . mometasone-formoterol (DULERA) 100-5 MCG/ACT AERO Inhale 2 puffs into the lungs 2 (two) times daily.  1 Inhaler  4  . DISCONTD: predniSONE (DELTASONE) 5 MG tablet Taper as directed        Blessed Cotham Pager:  [phone] 12/03/2011, 3:51 PM

## 2011-12-03 NOTE — Patient Instructions (Signed)
Dulera two puffs twice per day, and rinse mouth after each use Follow up in 2 months

## 2012-02-04 ENCOUNTER — Encounter: Payer: Self-pay | Admitting: Pulmonary Disease

## 2012-02-04 ENCOUNTER — Ambulatory Visit (INDEPENDENT_AMBULATORY_CARE_PROVIDER_SITE_OTHER): Payer: Medicare Other | Admitting: Pulmonary Disease

## 2012-02-04 VITALS — BP 142/74 | HR 66 | Temp 97.7°F | Ht 67.0 in | Wt 213.0 lb

## 2012-02-04 DIAGNOSIS — J449 Chronic obstructive pulmonary disease, unspecified: Secondary | ICD-10-CM

## 2012-02-04 DIAGNOSIS — R0982 Postnasal drip: Secondary | ICD-10-CM

## 2012-02-04 MED ORDER — MOMETASONE FURO-FORMOTEROL FUM 100-5 MCG/ACT IN AERO: 2.0000 | INHALATION_SPRAY | RESPIRATORY_TRACT | Status: DC | PRN

## 2012-02-04 MED ORDER — MOMETASONE FUROATE 50 MCG/ACT NA SUSP: 2.0000 | Freq: Every day | NASAL | Status: DC | PRN

## 2012-02-04 NOTE — Assessment & Plan Note (Signed)
Improved.  He can using nasonex as needed.

## 2012-02-04 NOTE — Assessment & Plan Note (Signed)
Improved. He can continue prn dulera and albuterol.

## 2012-02-04 NOTE — Patient Instructions (Signed)
Follow up in 6 months 

## 2012-02-04 NOTE — Progress Notes (Signed)
Chief Complaint  Patient presents with  . Follow-up    breathing is good. denies any cough, wheezing, chest tx. doing pretty good and has no concerns    History of Present Illness: Ian [name] is a 76 y.o. male former smoker with chronic cough 2nd to COPD with asthma, and post-nasal drip.  He has been doing well.  He is not having much cough.  He denies sputum, wheeze or chest pain.  His sinuses are doing better.  He uses dulera and albuterol as needed.  These help when he uses them.  In total, he is using an inhaler about 3 to 4 times per week.   Past Medical History  Diagnosis Date  . Diabetes mellitus   . Hypertension   . COPD (chronic obstructive pulmonary disease)   . Kidney disease     mild  . Hyperlipidemia     Past Surgical History  Procedure Date  . Hernia repair     No Known Allergies  Physical Exam:  Blood pressure 142/74, pulse 66, temperature 97.7 F (36.5 C), temperature source Oral, height 5\' 7"  (1.702 m), weight 213 lb (96.616 kg), SpO2 97.00%.  Body mass index is 33.36 kg/(m^2). Wt Readings from Last 2 Encounters:  02/04/12 213 lb (96.616 kg)  12/03/11 214 lb 9.6 oz (97.342 kg)    General - Obese, no distress  HEENT - PERRLA, EOMI, narrow nasal angles, clear nasal discharge, no oral exudate, no LAN, b/l hearing aides  Cardiac - s1s2 regular, no murmur, pulses symmetric  Chest - good air entry, no wheeze/rales/dullness  Abdomen - obese, soft, nontender, no organomegaly  Extremities - no e/c/c  Neurologic - normal strength, CN intact  Skin - no rashes  Psychiatric - normal mood, behavior   Assessment/Plan:  Outpatient Encounter Prescriptions as of 02/04/2012  Medication Sig Dispense Refill  . albuterol (PROVENTIL HFA;VENTOLIN HFA) 108 (90 BASE) MCG/ACT inhaler Inhale 2 puffs into the lungs every 6 (six) hours as needed.      Marland Kitchen BAYER CONTOUR TEST test strip 1 strip Daily.      . benzonatate (TESSALON) 100 MG capsule Take 100 mg by mouth 3 (three)  times daily as needed.      . cetirizine (ZYRTEC) 10 MG tablet Take 1 tablet (10 mg total) by mouth daily. One tab daily for allergies  30 tablet  1  . fluticasone (FLONASE) 50 MCG/ACT nasal spray Place 1 spray into the nose 2 (two) times daily.  1 g  2  . glimepiride (AMARYL) 4 MG tablet Take 4 mg by mouth daily before breakfast.      . lisinopril (PRINIVIL,ZESTRIL) 5 MG tablet Take 5 mg by mouth daily.      Marland Kitchen loratadine (CLARITIN) 10 MG tablet Take 10 mg by mouth daily as needed.      . mometasone (NASONEX) 50 MCG/ACT nasal spray Place 2 sprays into the nose daily.  17 g  11  . mometasone-formoterol (DULERA) 100-5 MCG/ACT AERO Inhale 2 puffs into the lungs as needed for wheezing or shortness of breath.  1 Inhaler  4  . PARoxetine (PAXIL) 20 MG tablet Take 20 mg by mouth every morning.      . traZODone (DESYREL) 150 MG tablet Take 150 mg by mouth at bedtime.      Marland Kitchen DISCONTD: mometasone-formoterol (DULERA) 100-5 MCG/ACT AERO Inhale 2 puffs into the lungs 2 (two) times daily.  1 Inhaler  4    [name] Pager:  [phone] 02/04/2012, 12:01 PM

## 2012-05-08 ENCOUNTER — Ambulatory Visit: Payer: Medicare Other | Admitting: Pulmonary Disease

## 2012-06-10 ENCOUNTER — Encounter: Payer: Self-pay | Admitting: Pulmonary Disease

## 2012-06-10 ENCOUNTER — Ambulatory Visit (INDEPENDENT_AMBULATORY_CARE_PROVIDER_SITE_OTHER): Payer: Medicare Other | Admitting: Pulmonary Disease

## 2012-06-10 VITALS — BP 112/52 | HR 55 | Temp 97.3°F | Ht 67.0 in | Wt 218.2 lb

## 2012-06-10 DIAGNOSIS — J4489 Other specified chronic obstructive pulmonary disease: Secondary | ICD-10-CM

## 2012-06-10 DIAGNOSIS — R0982 Postnasal drip: Secondary | ICD-10-CM

## 2012-06-10 DIAGNOSIS — J449 Chronic obstructive pulmonary disease, unspecified: Secondary | ICD-10-CM

## 2012-06-10 MED ORDER — MOMETASONE FUROATE 50 MCG/ACT NA SUSP: 2.0000 | Freq: Every day | NASAL | Status: DC | PRN

## 2012-06-10 NOTE — Assessment & Plan Note (Signed)
Stable on his current inhaler regimen.

## 2012-06-10 NOTE — Patient Instructions (Signed)
Follow up in 1 year.

## 2012-06-10 NOTE — Progress Notes (Signed)
Chief Complaint  Patient presents with  . Follow-up    breathing is good. no wheezing, chest tx cough. c/o nasal congestion, slight PND. using nasonex    History of Present Illness: Ian [name] is a 76 y.o. male former smoker with chronic cough from COPD/asthma, and post-nasal drip.  His breathing has been doing well.  He gets occasional cough.  He is not having wheeze, sputum, or chest tightness.  He does well with activities.  He uses his albuterol 1 or twice per week.  He ran out of nasonex, and noticed more trouble with runny nose and sinus drainage.  Tests: PFT 11/29/11>>FEV1 1.71 (67%), FEV1% 64, TLC 5.29 (92%), DLCO 84%, +BD  Past Medical History  Diagnosis Date  . Diabetes mellitus   . Hypertension   . COPD (chronic obstructive pulmonary disease)   . Kidney disease     mild  . Hyperlipidemia     Past Surgical History  Procedure Date  . Hernia repair     Current Outpatient Prescriptions on File Prior to Visit  Medication Sig Dispense Refill  . albuterol (PROVENTIL HFA;VENTOLIN HFA) 108 (90 BASE) MCG/ACT inhaler Inhale 2 puffs into the lungs every 6 (six) hours as needed.      Marland Kitchen BAYER CONTOUR TEST test strip 1 strip Daily.      . benzonatate (TESSALON) 100 MG capsule Take 100 mg by mouth 3 (three) times daily as needed.      . cetirizine (ZYRTEC) 10 MG tablet Take 1 tablet (10 mg total) by mouth daily. One tab daily for allergies  30 tablet  1  . glimepiride (AMARYL) 4 MG tablet Take 4 mg by mouth daily before breakfast.      . lisinopril (PRINIVIL,ZESTRIL) 5 MG tablet Take 5 mg by mouth daily.      Marland Kitchen loratadine (CLARITIN) 10 MG tablet Take 10 mg by mouth daily as needed.      . mometasone-formoterol (DULERA) 100-5 MCG/ACT AERO Inhale 2 puffs into the lungs as needed for wheezing or shortness of breath.  1 Inhaler  4  . PARoxetine (PAXIL) 20 MG tablet Take 20 mg by mouth every morning.      . traZODone (DESYREL) 150 MG tablet Take 150 mg by mouth at bedtime.      .  [DISCONTINUED] mometasone (NASONEX) 50 MCG/ACT nasal spray Place 2 sprays into the nose daily as needed (allergies).  17 g  11    No Known Allergies   Physical Exam: Filed Vitals:   06/10/12 1534 06/10/12 1536  BP:  112/52  Pulse:  55  Temp: 97.3 F (36.3 C)   TempSrc: Oral   Height: 5\' 7"  (1.702 m)   Weight: 218 lb 3.2 oz (98.975 kg)   SpO2:  93%  ,  Current Encounter SPO2  06/10/12 1536 93%  02/04/12 1133 97%  12/03/11 1516 92%    Wt Readings from Last 3 Encounters:  06/10/12 218 lb 3.2 oz (98.975 kg)  02/04/12 213 lb (96.616 kg)  12/03/11 214 lb 9.6 oz (97.342 kg)    Body mass index is 34.17 kg/(m^2).   General - No distress ENT - No sinus tenderness, clear nasal discharge, no oral exudate, no LAN Cardiac - s1s2 regular, no murmur Chest - No wheeze/rales/dullness, good air entry, normal respiratory excursion Back - No focal tenderness Abd - Soft, non-tender Ext - No edema Neuro - Normal strength Skin - No rashes Psych - Normal mood, and behavior   Assessment/Plan:  Chesley Mires,  MD [place] [facility] Pager:  [phone] 06/10/2012, 3:51 PM

## 2012-06-10 NOTE — Assessment & Plan Note (Signed)
Worse after he ran out of nasal steroids.  Will renew nasonex.

## 2012-07-07 DIAGNOSIS — H35369 Drusen (degenerative) of macula, unspecified eye: Secondary | ICD-10-CM | POA: Insufficient documentation

## 2012-08-12 ENCOUNTER — Ambulatory Visit
Admission: RE | Admit: 2012-08-12 | Discharge: 2012-08-12 | Disposition: A | Discharge status: Home / Self Care | Payer: PRIVATE HEALTH INSURANCE | Source: Ambulatory Visit | Attending: Nurse Practitioner | Admitting: Nurse Practitioner

## 2012-08-12 ENCOUNTER — Other Ambulatory Visit: Payer: Self-pay | Admitting: Nurse Practitioner

## 2012-08-12 DIAGNOSIS — R05 Cough: Secondary | ICD-10-CM

## 2012-10-13 ENCOUNTER — Encounter: Payer: Self-pay | Admitting: Pulmonary Disease

## 2012-10-13 ENCOUNTER — Ambulatory Visit (INDEPENDENT_AMBULATORY_CARE_PROVIDER_SITE_OTHER): Payer: PRIVATE HEALTH INSURANCE | Admitting: Pulmonary Disease

## 2012-10-13 VITALS — BP 138/64 | HR 53 | Temp 96.9°F | Ht 67.0 in | Wt 224.8 lb

## 2012-10-13 DIAGNOSIS — J302 Other seasonal allergic rhinitis: Secondary | ICD-10-CM

## 2012-10-13 DIAGNOSIS — J309 Allergic rhinitis, unspecified: Secondary | ICD-10-CM

## 2012-10-13 DIAGNOSIS — J449 Chronic obstructive pulmonary disease, unspecified: Secondary | ICD-10-CM

## 2012-10-13 MED ORDER — MOMETASONE FUROATE 50 MCG/ACT NA SUSP: 2.0000 | Freq: Every day | NASAL | Status: DC

## 2012-10-13 MED ORDER — MONTELUKAST SODIUM 10 MG PO TABS: 10.0000 mg | ORAL_TABLET | Freq: Every day | ORAL | Status: DC

## 2012-10-13 NOTE — Addendum Note (Signed)
Addended by: Inge Rise on: 10/13/2012 01:27 PM   Modules accepted: Orders

## 2012-10-13 NOTE — Progress Notes (Signed)
Chief Complaint  Patient presents with  . Acute Visit    Pt c/o chest congestion, nasal congestion, PND, sneezing, occasional cough w/ light grey phlem, chest tx, watery/itchy eyes, occasional HA off and on x 2 weeks. Pt has been using symb 80 x 2 weeks now. He states his breathing has been fine.     History of Present Illness: Ian [name] is a 77 y.o. male former smoker with chronic cough from COPD/asthma, and post-nasal drip.  He has noticed more sinus congestion for the past 2 weeks.  He is sniffling and sneezing more.  He is eyes have been getting watery.  He has occasional cough, but not much sputum.  He is not having wheeze.  He denies fever, ear pain, or headache.  He was switched from dulera to symbicort due to expense of medicine, and he feels symbicort works well.  He is not using albuterol much.  TESTS: PFT 11/29/11>>FEV1 1.71 (67%), FEV1% 64, TLC 5.29 (92%), DLCO 84%, +BD  Ian [name]  has a past medical history of Diabetes mellitus; Hypertension; COPD (chronic obstructive pulmonary disease); Kidney disease; and Hyperlipidemia.  Ian [name]  has past surgical history that includes Hernia repair.  Prior to Admission medications   Medication Sig Start Date End Date Taking? Authorizing Provider  albuterol (PROVENTIL HFA;VENTOLIN HFA) 108 (90 BASE) MCG/ACT inhaler Inhale 2 puffs into the lungs every 6 (six) hours as needed.   Yes Historical Provider, MD  amLODipine (NORVASC) 10 MG tablet Take 10 mg by mouth daily.   Yes Historical Provider, MD  BAYER CONTOUR TEST test strip 1 strip Daily. 11/20/11  Yes Historical Provider, MD  budesonide-formoterol (SYMBICORT) 80-4.5 MCG/ACT inhaler Inhale 2 puffs into the lungs 2 (two) times daily.   Yes Historical Provider, MD  cetirizine (ZYRTEC) 10 MG tablet Take 1 tablet (10 mg total) by mouth daily. One tab daily for allergies 10/28/11 10/27/12 Yes Billy Fischer, MD  Fenofibric Acid 35 MG TABS Take 35 mg by mouth daily.   Yes Historical Provider, MD   glimepiride (AMARYL) 4 MG tablet Take 4 mg by mouth daily before breakfast.   Yes Historical Provider, MD  lisinopril (PRINIVIL,ZESTRIL) 5 MG tablet Take 5 mg by mouth daily.   Yes Historical Provider, MD  loratadine (CLARITIN) 10 MG tablet Take 10 mg by mouth daily as needed.   Yes Historical Provider, MD  metoprolol tartrate (LOPRESSOR) 25 MG tablet Take 25 mg by mouth 2 (two) times daily.    Yes Historical Provider, MD  mometasone (NASONEX) 50 MCG/ACT nasal spray Place 2 sprays into the nose daily as needed (allergies). 06/10/12 06/10/13 Yes Chesley Mires, MD  PARoxetine (PAXIL) 20 MG tablet Take 20 mg by mouth every morning.   Yes Historical Provider, MD  traZODone (DESYREL) 150 MG tablet Take 150 mg by mouth at bedtime.   Yes Historical Provider, MD    No Known Allergies   Physical Exam:  General - No distress ENT - No sinus tenderness, clear nasal discharge, boggy mucosa, no oral exudate, TM clear b/l, no LAN Cardiac - s1s2 regular, no murmur Chest - No wheeze/rales/dullness Back - No focal tenderness Abd - Soft, non-tender Ext - No edema Neuro - Normal strength Skin - No rashes Psych - normal mood, and behavior   Assessment/Plan:  Chesley Mires, MD [place] [facility] Pager:  [phone]

## 2012-10-13 NOTE — Patient Instructions (Signed)
Continue symbicort two puffs twice per day Use montelukast (singulair) 10 mg pill nightly for one month Use nasal irrigation (saline nasal spray) daily until sinus symptoms better, then as needed Use nasonex two sprays in each nostril daily until sinus symptoms better, then as needed Uses claritin 10 mg daily until sinus symptoms better, then as needed Call if not feeling better Follow up in 6 months

## 2012-10-13 NOTE — Assessment & Plan Note (Signed)
Continue symbicort and prn albuterol.  I gave him sample of symbicort.

## 2012-10-13 NOTE — Assessment & Plan Note (Signed)
His current symptoms are related to worsening allergies.  I have advised him to use nasal irrigation, nasonex, and claritin on a daily basis until his symptoms are better >> he can then use as needed.  Will have him use singulair nightly for one month. He can refill his script after this if he feels this is helping.

## 2012-10-19 ENCOUNTER — Other Ambulatory Visit: Payer: Self-pay | Admitting: *Deleted

## 2012-10-19 MED ORDER — METOPROLOL TARTRATE 25 MG PO TABS: ORAL_TABLET | ORAL | Status: DC

## 2012-11-10 ENCOUNTER — Ambulatory Visit (INDEPENDENT_AMBULATORY_CARE_PROVIDER_SITE_OTHER): Payer: PRIVATE HEALTH INSURANCE | Admitting: Internal Medicine

## 2012-11-10 ENCOUNTER — Encounter: Payer: Self-pay | Admitting: *Deleted

## 2012-11-10 ENCOUNTER — Encounter: Payer: Self-pay | Admitting: Internal Medicine

## 2012-11-10 VITALS — BP 134/86 | HR 61 | Temp 98.2°F | Resp 15 | Wt 220.8 lb

## 2012-11-10 DIAGNOSIS — J209 Acute bronchitis, unspecified: Secondary | ICD-10-CM

## 2012-11-10 DIAGNOSIS — J449 Chronic obstructive pulmonary disease, unspecified: Secondary | ICD-10-CM

## 2012-11-10 DIAGNOSIS — J4489 Other specified chronic obstructive pulmonary disease: Secondary | ICD-10-CM

## 2012-11-10 MED ORDER — ALBUTEROL SULFATE HFA 108 (90 BASE) MCG/ACT IN AERS: 2.0000 | INHALATION_SPRAY | Freq: Four times a day (QID) | RESPIRATORY_TRACT | Status: DC | PRN

## 2012-11-10 MED ORDER — MOXIFLOXACIN HCL 400 MG PO TABS: 400.0000 mg | ORAL_TABLET | Freq: Every day | ORAL | Status: DC

## 2012-11-10 NOTE — Progress Notes (Signed)
  Subjective:    Patient ID: Ian [name], male    DOB: [birth year], 77 y.o.   MRN: [id number]  Chief Complaint  Patient presents with  . Cough  . Nasal Congestion  . Fever    HPI Patient started with symptoms of allergy to pollen 3 weeks back.he took OTC allergy medication. But his symptoms continued to worse and now has greenish yellow phlegm production. Also has runny nose and has greenish mucus from nose. He then had fever last Friday with temp of 100. He denies any chills.  He has been feeling feverish on and off since then. He is taking mucinex at present. He mentions his wife undergoing chemotherapy Does not smoke  Review of Systems  Constitutional: Positive for fever and fatigue. Negative for chills, diaphoresis and appetite change.  HENT: Positive for congestion, sore throat, rhinorrhea and sinus pressure. Negative for ear pain, nosebleeds, mouth sores, trouble swallowing, neck stiffness, tinnitus and ear discharge.   Respiratory: Positive for cough and shortness of breath.   Cardiovascular: Negative for chest pain and palpitations.  Gastrointestinal: Negative for abdominal distention.  Genitourinary: Negative for dysuria.  Neurological: Negative for dizziness and light-headedness.  Hematological: Negative for adenopathy.  Psychiatric/Behavioral: Negative for behavioral problems.       Objective:   Physical Exam  Constitutional: He is oriented to person, place, and time. He appears well-developed. No distress.  HENT:  Head: Normocephalic and atraumatic.  Right Ear: External ear normal.  Left Ear: External ear normal.  Nose: Mucosal edema and rhinorrhea present. Right sinus exhibits no maxillary sinus tenderness and no frontal sinus tenderness. Left sinus exhibits no maxillary sinus tenderness and no frontal sinus tenderness.  Mouth/Throat: Oropharynx is clear and moist and mucous membranes are normal. No oropharyngeal exudate or tonsillar abscesses.  Has oropharyngeal  erythema  Neck: Normal range of motion. Neck supple.  Right anterior cervical lymph node tender and mildly enlarged  Cardiovascular: Normal rate and regular rhythm.   Pulmonary/Chest: Effort normal. No respiratory distress. He has no wheezes.  Rhonchi present bilaterally at bases  Abdominal: Soft. Bowel sounds are normal.  Musculoskeletal: Normal range of motion.  Lymphadenopathy:    He has cervical adenopathy.  Neurological: He is alert and oriented to person, place, and time.  Skin: Skin is warm and dry. He is not diaphoretic.  Psychiatric: He has a normal mood and affect. His behavior is normal.   BP 134/86  Pulse 61  Temp(Src) 98.2 F (36.8 C) (Oral)  Resp 15  Wt 220 lb 12.8 oz (100.154 kg)  BMI 34.57 kg/m2  SpO2 95%     Assessment & Plan:   Acute bronchitis- will have him on avelox once a day for a week and will also have him on albuterol inahelr q6h prn to help with his airways besides symbicort. Continue mucinex. If symptoms fail to improve, will need to get cxr to rule out infiltrates and consider a course of prednisone for copd exacerbation. Informed patient and he voices understanding this

## 2012-11-19 ENCOUNTER — Ambulatory Visit: Payer: Self-pay | Admitting: Internal Medicine

## 2012-11-19 ENCOUNTER — Encounter: Payer: Self-pay | Admitting: *Deleted

## 2012-11-20 ENCOUNTER — Other Ambulatory Visit: Payer: Self-pay | Admitting: Internal Medicine

## 2012-11-24 ENCOUNTER — Encounter: Payer: Self-pay | Admitting: *Deleted

## 2012-11-26 ENCOUNTER — Ambulatory Visit (INDEPENDENT_AMBULATORY_CARE_PROVIDER_SITE_OTHER): Payer: PRIVATE HEALTH INSURANCE | Admitting: Internal Medicine

## 2012-11-26 ENCOUNTER — Encounter: Payer: Self-pay | Admitting: Internal Medicine

## 2012-11-26 ENCOUNTER — Other Ambulatory Visit: Payer: Self-pay | Admitting: Geriatric Medicine

## 2012-11-26 VITALS — BP 162/80 | HR 87 | Temp 98.2°F | Resp 20 | Ht 66.0 in | Wt 222.0 lb

## 2012-11-26 DIAGNOSIS — F0280 Dementia in other diseases classified elsewhere without behavioral disturbance: Secondary | ICD-10-CM | POA: Insufficient documentation

## 2012-11-26 DIAGNOSIS — I10 Essential (primary) hypertension: Secondary | ICD-10-CM

## 2012-11-26 DIAGNOSIS — F419 Anxiety disorder, unspecified: Secondary | ICD-10-CM | POA: Insufficient documentation

## 2012-11-26 DIAGNOSIS — F411 Generalized anxiety disorder: Secondary | ICD-10-CM

## 2012-11-26 DIAGNOSIS — I152 Hypertension secondary to endocrine disorders: Secondary | ICD-10-CM | POA: Insufficient documentation

## 2012-11-26 DIAGNOSIS — J4489 Other specified chronic obstructive pulmonary disease: Secondary | ICD-10-CM

## 2012-11-26 DIAGNOSIS — G309 Alzheimer's disease, unspecified: Secondary | ICD-10-CM

## 2012-11-26 DIAGNOSIS — J209 Acute bronchitis, unspecified: Secondary | ICD-10-CM

## 2012-11-26 DIAGNOSIS — J320 Chronic maxillary sinusitis: Secondary | ICD-10-CM | POA: Insufficient documentation

## 2012-11-26 DIAGNOSIS — E785 Hyperlipidemia, unspecified: Secondary | ICD-10-CM | POA: Insufficient documentation

## 2012-11-26 DIAGNOSIS — J449 Chronic obstructive pulmonary disease, unspecified: Secondary | ICD-10-CM

## 2012-11-26 DIAGNOSIS — E1159 Type 2 diabetes mellitus with other circulatory complications: Secondary | ICD-10-CM | POA: Insufficient documentation

## 2012-11-26 MED ORDER — LOSARTAN POTASSIUM 25 MG PO TABS: 25.0000 mg | ORAL_TABLET | Freq: Every day | ORAL | Status: DC

## 2012-11-26 MED ORDER — ALBUTEROL SULFATE HFA 108 (90 BASE) MCG/ACT IN AERS: 2.0000 | INHALATION_SPRAY | Freq: Four times a day (QID) | RESPIRATORY_TRACT | Status: DC | PRN

## 2012-11-26 MED ORDER — MOMETASONE FUROATE 50 MCG/ACT NA SUSP: 2.0000 | Freq: Every day | NASAL | Status: DC

## 2012-11-26 MED ORDER — METOPROLOL TARTRATE 25 MG PO TABS: ORAL_TABLET | ORAL | Status: DC

## 2012-11-26 MED ORDER — GLUCOSE BLOOD VI STRP: ORAL_STRIP | Status: DC

## 2012-11-26 MED ORDER — LISINOPRIL 5 MG PO TABS: 5.0000 mg | ORAL_TABLET | Freq: Every day | ORAL | Status: DC

## 2012-11-26 MED ORDER — MONTELUKAST SODIUM 10 MG PO TABS: 10.0000 mg | ORAL_TABLET | Freq: Every day | ORAL | Status: DC

## 2012-11-26 NOTE — Progress Notes (Signed)
Patient ID: Ian [name], male   DOB: 1936/04/26, 77 y.o.   MRN: [id number] Code Status: full code   No Known Allergies  Chief Complaint  Patient presents with  . Medical Managment of Chronic Issues    breathing and sinus problems    HPI: Patient is a 77 y.o. white male seen in the office today for regular visit.  Has had a bad case of trouble with pollen since 3 weeks ago.  Coughs at night and during the day.  Short of breath at times.  Cough was initially productive of green sputum.  Has improved to white now.  Had a lot in his chest.  Denies fever.  Stuffy in his nose.  Has been blowing it a lot.  No sore throat.  It is scratchy.  Is not flushing his sinuses as much as he should (sounds like not at all).    His wife sent a note about him being anxious.    His wife has uterine cancer and had surgery and is on chemotherapy.  Has good and bad days.    Review of Systems:  Review of Systems  Constitutional: Positive for malaise/fatigue. Negative for fever, chills and weight loss.  HENT: Positive for hearing loss and congestion. Negative for ear pain and ear discharge.   Eyes: Negative for blurred vision.  Respiratory: Positive for cough, sputum production, shortness of breath and wheezing.   Cardiovascular: Negative for chest pain.  Skin: Negative for rash.  Neurological: Negative for dizziness.  Endo/Heme/Allergies: Does not bruise/bleed easily.  Psychiatric/Behavioral: Positive for memory loss. Negative for depression. The patient is nervous/anxious.      Past Medical History  Diagnosis Date  . Diabetes mellitus   . Hypertension   . COPD (chronic obstructive pulmonary disease)   . Kidney disease     mild  . Hyperlipidemia   . Hypertrophy of prostate without urinary obstruction and other lower urinary tract symptoms (LUTS)   . Hypercalcemia   . Edema   . Ventral hernia, unspecified, without mention of obstruction or gangrene   . Anxiety   . Depression   . Allergy   .  Unspecified sleep apnea   . Hypertrophy of prostate without urinary obstruction and other lower urinary tract symptoms (LUTS)   . Anemia, unspecified   . Unspecified disorder of kidney and ureter   . Alzheimer's disease    Past Surgical History  Procedure Laterality Date  . Hernia repair    . Eyplosatory lap  2002   Social History:   reports that he quit smoking about 24 years ago. He does not have any smokeless tobacco history on file. He reports that he does not drink alcohol or use illicit drugs.  Family History  Problem Relation Age of Onset  . Pancreatic cancer Sister   . Heart disease Father   . Hypertension Brother     Medications: Patient's Medications  New Prescriptions   No medications on file  Previous Medications   ALBUTEROL (PROVENTIL HFA;VENTOLIN HFA) 108 (90 BASE) MCG/ACT INHALER    Inhale 2 puffs into the lungs every 6 (six) hours as needed for wheezing or shortness of breath.   AMLODIPINE (NORVASC) 10 MG TABLET    Take 10 mg by mouth daily.   BAYER CONTOUR TEST TEST STRIP    1 strip Daily.   BUDESONIDE-FORMOTEROL (SYMBICORT) 80-4.5 MCG/ACT INHALER    Inhale 2 puffs into the lungs 2 (two) times daily.   FENOFIBRIC ACID 35 MG TABS  Take 35 mg by mouth daily.   GLIMEPIRIDE (AMARYL) 4 MG TABLET    Take 4 mg by mouth daily before breakfast.   LISINOPRIL (PRINIVIL,ZESTRIL) 5 MG TABLET    Take 5 mg by mouth daily.   LORATADINE (CLARITIN) 10 MG TABLET    Take 10 mg by mouth daily as needed.   LOSARTAN (COZAAR) 25 MG TABLET       METOPROLOL TARTRATE (LOPRESSOR) 25 MG TABLET    Take one tablet twice a day for blood pressure   MOMETASONE (NASONEX) 50 MCG/ACT NASAL SPRAY    Place 2 sprays into the nose daily.   MONTELUKAST (SINGULAIR) 10 MG TABLET    Take 1 tablet (10 mg total) by mouth at bedtime.   MOXIFLOXACIN (AVELOX) 400 MG TABLET    Take 1 tablet (400 mg total) by mouth daily.   PAROXETINE (PAXIL) 20 MG TABLET    Take 20 mg by mouth every morning.   TRAZODONE  (DESYREL) 150 MG TABLET    Take 150 mg by mouth at bedtime.  Modified Medications   No medications on file  Discontinued Medications   No medications on file     Physical Exam:  Filed Vitals:   11/26/12 1552  BP: 162/80  Pulse: 87  Temp: 98.2 F (36.8 C)  TempSrc: Oral  Resp: 20  Height: 5\' 6"  (1.676 m)  Weight: 222 lb (100.699 kg)  SpO2: 97%  Physical Exam  Constitutional: He appears well-developed and well-nourished. No distress.  Obese white male  HENT:  Head: Normocephalic and atraumatic.  Mouth/Throat: Oropharyngeal exudate present.  Eyes: EOM are normal. Pupils are equal, round, and reactive to light.  Pulmonary/Chest: Effort normal. He has wheezes.  Rhonchi present, as well  Abdominal: Soft. Bowel sounds are normal. He exhibits no distension. There is no tenderness.  Musculoskeletal: Normal range of motion.  Lymphadenopathy:    He has no cervical adenopathy.  Neurological: He is alert.  Oriented x 2  Skin: Skin is warm and dry.  Psychiatric: He has a normal mood and affect.   [name] reviewed: 02/06/2011 BMP: glucose 127, BUN 27, Creatinine 1.66 HgbA1c 6.0  05/13/2011 BMP; Glucose 53, BUN 24, Creatinine 1.51 A1c 6.1 08/14/2011 CBC Wbc 6.4 Rbc 4.43 Hemoglobin 14.1  CMP Glucose 129 Bun 28 Creatinine 1.57  Lipid Panel Cholesterol  195 Triglycerides 87 Hdl 44 Ldl 134  10/07/2011 BMP, Glucose 86, BUN 28, Creatinine 1.64 A1c 5.8 02/13/2012  CBC: wbc 6.2, rbc 4.55, Hemoglobin 14.7 CMP: glucose 127, BUN 25, Creatinine 1.54, Alkaline Phosphatase 42 A1C: 6.1 Lipid: Cholesterol 237, Triglycerides 116, HDL 46, LDL 168 05/18/2012 BMP; Glucose 123, BUN 26, Creatinine 1.54 A1c 5.8 Lipid Panel; Cholesterol 205, Triglycerides 101, HDL  41, LDL 144  Past Procedures: 11/29/11:  PFTs--Dr. Halford Chessman 02/17/12: MMSE 28/30, failed clock  Assessment/Plan 1. Anxiety --is worried about his wife --does not appear overly anxious here in the office --avoid benzos as he has dementia and they  will worsen his cognition and increase his fall risk  2. Alzheimer's disease --recheck MMSE this summer --seems stable, and I suspect his dementia is more vascular  3. Hypertension --satisfactory at home and here  4. COPD (chronic obstructive pulmonary disease) --follow with pulmonary --seems to be having sinusitis at this time--discussed conservative measures with nettipot, warm humidity, cough syrup or mucinex --if fever develops, becomes short of breath or not getting better in a week, call us back  5. Hyperlipidemia --f/u [name], cont statin - Lipid panel - CMP  6. Chronic maxillary sinusitis -check for leukocytosis with CBC with Differential - CMP for renal function -may need abx for acute sinusitis if not improving in a week-not overtly ill at present  [name]/tests ordered:  Cbc, cmp, flp today Due for MMSE soon

## 2012-12-07 ENCOUNTER — Other Ambulatory Visit: Payer: Self-pay | Admitting: Pulmonary Disease

## 2012-12-09 NOTE — Telephone Encounter (Signed)
Per your last OV note (10/13/2012) you instructed the pt to only take Singulair for one month. Would you be okay with refilling this for him?  Doesn't have any pending OV's  Please advise. Thanks.

## 2012-12-22 ENCOUNTER — Other Ambulatory Visit: Payer: Self-pay | Admitting: *Deleted

## 2012-12-22 MED ORDER — ONETOUCH ULTRA SYSTEM W/DEVICE KIT: 1.0000 | PACK | Freq: Once | Status: DC

## 2012-12-23 ENCOUNTER — Other Ambulatory Visit: Payer: Self-pay | Admitting: Geriatric Medicine

## 2012-12-23 MED ORDER — GLUCOSE BLOOD VI STRP: ORAL_STRIP | Status: DC

## 2012-12-28 ENCOUNTER — Other Ambulatory Visit: Payer: Self-pay

## 2012-12-28 ENCOUNTER — Encounter (HOSPITAL_COMMUNITY): Payer: Self-pay | Admitting: *Deleted

## 2012-12-28 ENCOUNTER — Encounter (HOSPITAL_COMMUNITY): Payer: Self-pay

## 2012-12-28 ENCOUNTER — Emergency Department (INDEPENDENT_AMBULATORY_CARE_PROVIDER_SITE_OTHER): Payer: PRIVATE HEALTH INSURANCE

## 2012-12-28 ENCOUNTER — Inpatient Hospital Stay (HOSPITAL_COMMUNITY)
Admission: EM | Admit: 2012-12-28 | Discharge: 2013-01-04 | DRG: 194 | Disposition: A | Discharge status: Skilled Nursing Facility | Payer: PRIVATE HEALTH INSURANCE | Attending: Internal Medicine | Admitting: Internal Medicine

## 2012-12-28 ENCOUNTER — Emergency Department (HOSPITAL_COMMUNITY): Payer: PRIVATE HEALTH INSURANCE

## 2012-12-28 ENCOUNTER — Emergency Department (HOSPITAL_COMMUNITY)
Admission: EM | Admit: 2012-12-28 | Discharge: 2012-12-28 | Disposition: A | Discharge status: Other Acute Inpatient Hospital | Payer: PRIVATE HEALTH INSURANCE | Source: Home / Self Care | Attending: Emergency Medicine | Admitting: Emergency Medicine

## 2012-12-28 DIAGNOSIS — J189 Pneumonia, unspecified organism: Secondary | ICD-10-CM

## 2012-12-28 DIAGNOSIS — F329 Major depressive disorder, single episode, unspecified: Secondary | ICD-10-CM | POA: Diagnosis present

## 2012-12-28 DIAGNOSIS — F411 Generalized anxiety disorder: Secondary | ICD-10-CM | POA: Diagnosis present

## 2012-12-28 DIAGNOSIS — N179 Acute kidney failure, unspecified: Secondary | ICD-10-CM | POA: Diagnosis present

## 2012-12-28 DIAGNOSIS — F0280 Dementia in other diseases classified elsewhere without behavioral disturbance: Secondary | ICD-10-CM | POA: Diagnosis present

## 2012-12-28 DIAGNOSIS — R29818 Other symptoms and signs involving the nervous system: Secondary | ICD-10-CM

## 2012-12-28 DIAGNOSIS — N183 Chronic kidney disease, stage 3 unspecified: Secondary | ICD-10-CM | POA: Diagnosis present

## 2012-12-28 DIAGNOSIS — N390 Urinary tract infection, site not specified: Secondary | ICD-10-CM

## 2012-12-28 DIAGNOSIS — R0902 Hypoxemia: Secondary | ICD-10-CM | POA: Diagnosis not present

## 2012-12-28 DIAGNOSIS — G309 Alzheimer's disease, unspecified: Secondary | ICD-10-CM | POA: Diagnosis present

## 2012-12-28 DIAGNOSIS — H918X9 Other specified hearing loss, unspecified ear: Secondary | ICD-10-CM | POA: Diagnosis present

## 2012-12-28 DIAGNOSIS — E1159 Type 2 diabetes mellitus with other circulatory complications: Secondary | ICD-10-CM | POA: Diagnosis present

## 2012-12-28 DIAGNOSIS — J4489 Other specified chronic obstructive pulmonary disease: Secondary | ICD-10-CM | POA: Diagnosis present

## 2012-12-28 DIAGNOSIS — J302 Other seasonal allergic rhinitis: Secondary | ICD-10-CM

## 2012-12-28 DIAGNOSIS — R2689 Other abnormalities of gait and mobility: Secondary | ICD-10-CM

## 2012-12-28 DIAGNOSIS — E8779 Other fluid overload: Secondary | ICD-10-CM | POA: Diagnosis not present

## 2012-12-28 DIAGNOSIS — I10 Essential (primary) hypertension: Secondary | ICD-10-CM

## 2012-12-28 DIAGNOSIS — E785 Hyperlipidemia, unspecified: Secondary | ICD-10-CM

## 2012-12-28 DIAGNOSIS — E1129 Type 2 diabetes mellitus with other diabetic kidney complication: Secondary | ICD-10-CM | POA: Diagnosis present

## 2012-12-28 DIAGNOSIS — I129 Hypertensive chronic kidney disease with stage 1 through stage 4 chronic kidney disease, or unspecified chronic kidney disease: Secondary | ICD-10-CM | POA: Diagnosis present

## 2012-12-28 DIAGNOSIS — J449 Chronic obstructive pulmonary disease, unspecified: Secondary | ICD-10-CM | POA: Diagnosis present

## 2012-12-28 DIAGNOSIS — E11319 Type 2 diabetes mellitus with unspecified diabetic retinopathy without macular edema: Secondary | ICD-10-CM | POA: Diagnosis present

## 2012-12-28 DIAGNOSIS — R410 Disorientation, unspecified: Secondary | ICD-10-CM

## 2012-12-28 DIAGNOSIS — E8770 Fluid overload, unspecified: Secondary | ICD-10-CM

## 2012-12-28 DIAGNOSIS — E119 Type 2 diabetes mellitus without complications: Secondary | ICD-10-CM | POA: Diagnosis present

## 2012-12-28 DIAGNOSIS — Z79899 Other long term (current) drug therapy: Secondary | ICD-10-CM

## 2012-12-28 DIAGNOSIS — J320 Chronic maxillary sinusitis: Secondary | ICD-10-CM

## 2012-12-28 DIAGNOSIS — F29 Unspecified psychosis not due to a substance or known physiological condition: Secondary | ICD-10-CM

## 2012-12-28 DIAGNOSIS — R5381 Other malaise: Secondary | ICD-10-CM | POA: Diagnosis present

## 2012-12-28 DIAGNOSIS — E1139 Type 2 diabetes mellitus with other diabetic ophthalmic complication: Secondary | ICD-10-CM | POA: Diagnosis present

## 2012-12-28 DIAGNOSIS — F419 Anxiety disorder, unspecified: Secondary | ICD-10-CM

## 2012-12-28 DIAGNOSIS — F3289 Other specified depressive episodes: Secondary | ICD-10-CM | POA: Diagnosis present

## 2012-12-28 LAB — CBC
MCH: 32.4 pg (ref 26.0–34.0)
MCHC: 35.2 g/dL (ref 30.0–36.0)
MCV: 92.1 fL (ref 78.0–100.0)
Platelets: 160 10*3/uL (ref 150–400)
RDW: 13.6 % (ref 11.5–15.5)
WBC: 15 10*3/uL — ABNORMAL HIGH (ref 4.0–10.5)

## 2012-12-28 LAB — URINALYSIS, ROUTINE W REFLEX MICROSCOPIC
Glucose, UA: 100 mg/dL — AB
Leukocytes, UA: NEGATIVE
Specific Gravity, Urine: 1.031 — ABNORMAL HIGH (ref 1.005–1.030)
Urobilinogen, UA: 1 mg/dL (ref 0.0–1.0)

## 2012-12-28 LAB — POCT URINALYSIS DIP (DEVICE)
Glucose, UA: NEGATIVE mg/dL
Nitrite: NEGATIVE
Protein, ur: >=300 mg/dL — AB
Urobilinogen, UA: 1 mg/dL (ref 0.0–1.0)
pH: 6 (ref 5.0–8.0)

## 2012-12-28 LAB — COMPREHENSIVE METABOLIC PANEL
AST: 14 U/L (ref 0–37)
Albumin: 4 g/dL (ref 3.5–5.2)
BUN: 24 mg/dL — ABNORMAL HIGH (ref 6–23)
Calcium: 11.1 mg/dL — ABNORMAL HIGH (ref 8.4–10.5)
Creatinine, Ser: 1.82 mg/dL — ABNORMAL HIGH (ref 0.50–1.35)

## 2012-12-28 LAB — URINE MICROSCOPIC-ADD ON

## 2012-12-28 LAB — GLUCOSE, CAPILLARY: Glucose-Capillary: 172 mg/dL — ABNORMAL HIGH (ref 70–99)

## 2012-12-28 LAB — LACTIC ACID, PLASMA: Lactic Acid, Venous: 1.3 mmol/L (ref 0.5–2.2)

## 2012-12-28 MED ORDER — DEXTROSE 5 % IV SOLN
1.0000 g | Freq: Once | INTRAVENOUS | Status: AC
  Administered 2012-12-28: 1 g via INTRAVENOUS
  Filled 2012-12-28: qty 10

## 2012-12-28 MED ORDER — AZITHROMYCIN 500 MG IV SOLR
500.0000 mg | Freq: Once | INTRAVENOUS | Status: AC
  Administered 2012-12-28: 500 mg via INTRAVENOUS
  Filled 2012-12-28: qty 500

## 2012-12-28 MED ORDER — ACETAMINOPHEN 325 MG PO TABS
650.0000 mg | ORAL_TABLET | Freq: Four times a day (QID) | ORAL | Status: DC | PRN
  Administered 2012-12-28: 650 mg via ORAL
  Filled 2012-12-28: qty 2

## 2012-12-28 NOTE — ED Notes (Signed)
MD made aware of pt bp.

## 2012-12-28 NOTE — ED Provider Notes (Addendum)
History    CSN: [id number] Arrival date & time 12/28/12  1714  First MD Initiated Contact with Patient 12/28/12 1918     Chief Complaint  Patient presents with  . Pneumonia   (Consider location/radiation/quality/duration/timing/severity/associated sxs/prior Treatment) HPI Comments: Ian [name] is a 77 y.o. Male was here with complaint of fever, chills, and cough. That started today. He is also having more trouble walking, recently. He felt warm today, but has not been taking antipyretics. He took his usual medications today. He has been able to eat, without vomiting.he had been on known type of respiratory problem. One month ago and was treated with unknown medications by his doctor. He has Avelox on his prescription list the has not taken it according to his wife. There are no known modifying factors.  Patient is a 77 y.o. male presenting with pneumonia. The history is provided by the patient, the spouse and a relative.  Pneumonia   Past Medical History  Diagnosis Date  . Diabetes mellitus   . Hypertension   . COPD (chronic obstructive pulmonary disease)   . Kidney disease     mild  . Hyperlipidemia   . Hypertrophy of prostate without urinary obstruction and other lower urinary tract symptoms (LUTS)   . Hypercalcemia   . Edema   . Ventral hernia, unspecified, without mention of obstruction or gangrene   . Anxiety   . Depression   . Allergy   . Unspecified sleep apnea   . Hypertrophy of prostate without urinary obstruction and other lower urinary tract symptoms (LUTS)   . Anemia, unspecified   . Unspecified disorder of kidney and ureter   . Alzheimer's disease   . Chronic maxillary sinusitis    Past Surgical History  Procedure Laterality Date  . Hernia repair    . Eyplosatory lap  2002   Family History  Problem Relation Age of Onset  . Pancreatic cancer Sister   . Heart disease Father   . Hypertension Brother    History  Substance Use Topics  . Smoking status:  Former Smoker -- 30 years    Quit date: 07/08/1988  . Smokeless tobacco: Not on file     Comment: 1-2 ppd  . Alcohol Use: No    Review of Systems  All other systems reviewed and are negative.    Allergies  Review of patient's allergies indicates no known allergies.  Home Medications   Current Outpatient Rx  Name  Route  Sig  Dispense  Refill  . albuterol (PROVENTIL HFA;VENTOLIN HFA) 108 (90 BASE) MCG/ACT inhaler   Inhalation   Inhale 2 puffs into the lungs every 6 (six) hours as needed for wheezing or shortness of breath.   3.7 g   3   . amLODipine (NORVASC) 10 MG tablet   Oral   Take 10 mg by mouth daily.         . budesonide-formoterol (SYMBICORT) 80-4.5 MCG/ACT inhaler   Inhalation   Inhale 2 puffs into the lungs 2 (two) times daily.         . Fenofibric Acid 35 MG TABS   Oral   Take 35 mg by mouth every evening.          Marland Kitchen glimepiride (AMARYL) 4 MG tablet   Oral   Take 4 mg by mouth daily before breakfast.         . lisinopril (PRINIVIL,ZESTRIL) 5 MG tablet   Oral   Take 1 tablet (5 mg total) by mouth daily.  90 tablet   3   . loratadine (CLARITIN) 10 MG tablet   Oral   Take 10 mg by mouth daily as needed for allergies.          Marland Kitchen losartan (COZAAR) 25 MG tablet   Oral   Take 1 tablet (25 mg total) by mouth daily.   90 tablet   3   . metoprolol tartrate (LOPRESSOR) 25 MG tablet   Oral   Take 25 mg by mouth 2 (two) times daily.         . mometasone (NASONEX) 50 MCG/ACT nasal spray   Nasal   Place 2 sprays into the nose daily.   17 g   3   . montelukast (SINGULAIR) 10 MG tablet   Oral   Take 1 tablet (10 mg total) by mouth at bedtime.   90 tablet   3   . PARoxetine (PAXIL) 20 MG tablet   Oral   Take 20 mg by mouth every morning.         . traZODone (DESYREL) 150 MG tablet   Oral   Take 150 mg by mouth at bedtime.         . Blood Glucose Monitoring Suppl (ONE TOUCH ULTRA SYSTEM KIT) W/DEVICE KIT   Does not apply   1  kit by Does not apply route once. Use as Directed. DX: 250.02   1 each   0   . glucose blood (BAYER CONTOUR TEST) test strip      Use as directed to check blood sugar to control diabetes. 250.00   100 each   3   . glucose blood test strip      Use as instructed once daily to help control blood sugar.   100 each   12    BP 178/89  Pulse 93  Temp(Src) 99 F (37.2 C) (Oral)  Resp 29  SpO2 95% Physical Exam  Nursing note and vitals reviewed. Constitutional: He appears well-developed.  Elderly, frail  HENT:  Head: Normocephalic and atraumatic.  Right Ear: External ear normal.  Left Ear: External ear normal.  Eyes: Conjunctivae and EOM are normal. Pupils are equal, round, and reactive to light.  Neck: Normal range of motion and phonation normal. Neck supple.  Cardiovascular: Normal rate, regular rhythm, normal heart sounds and intact distal pulses.   Pulmonary/Chest: Effort normal and breath sounds normal. No respiratory distress. He exhibits no bony tenderness.  Scattered rhonchi no wheezes or rales  Abdominal: Soft. Normal appearance. There is no tenderness.  Musculoskeletal: Normal range of motion.  Neurological: He is alert. He has normal strength. No cranial nerve deficit or sensory deficit. He exhibits normal muscle tone. Coordination normal.  Oriented to person, and place  Skin: Skin is warm, dry and intact.  Psychiatric: He has a normal mood and affect. His behavior is normal.    ED Course  Procedures (including critical care time)  Medications  cefTRIAXone (ROCEPHIN) 1 g in dextrose 5 % 50 mL IVPB (0 g Intravenous Stopped 12/28/12 2251)  azithromycin (ZITHROMAX) 500 mg in dextrose 5 % 250 mL IVPB (500 mg Intravenous New Bag/Given 12/28/12 2249)    Patient Vitals for the past 24 hrs:  BP Temp Temp src Pulse Resp SpO2  12/29/12 0004 178/89 mmHg - - - 29 95 %  12/28/12 2345 172/80 mmHg - - 93 23 94 %  12/28/12 2245 178/80 mmHg - - 89 34 95 %  12/28/12 2145  161/89 mmHg - - 79  29 97 %  12/28/12 2045 152/67 mmHg - - 69 26 96 %  12/28/12 2015 139/61 mmHg 99 F (37.2 C) Oral 69 30 97 %  12/28/12 1915 135/64 mmHg - - 74 27 95 %  12/28/12 1850 150/69 mmHg 98.7 F (37.1 C) Oral - 24 95 %  12/28/12 1742 168/86 mmHg 101.7 F (38.7 C) - 95 20 92 %     Date: 12/28/12  Rate: 95  Rhythm: normal sinus rhythm  QRS Axis: normal  PR and QT Intervals: normal  ST/T Wave abnormalities: nonspecific ST changes  PR and QRS Conduction Disutrbances:none  Narrative Interpretation:   Old EKG Reviewed: none available   10:58 PM-Consult complete with hospitalist, Dr.   Hal Hope  . Patient case explained and discussed. he agrees to admit patient for further evaluation and treatment. Call ended at 23:37  Holding orders written. For admission.  Labs Reviewed  CBC - Abnormal; Notable for the following:    WBC 15.0 (*)    All other components within normal limits  COMPREHENSIVE METABOLIC PANEL - Abnormal; Notable for the following:    Glucose, Bld 175 (*)    BUN 24 (*)    Creatinine, Ser 1.82 (*)    Calcium 11.1 (*)    Total Protein 8.6 (*)    GFR calc non Af Amer 35 (*)    GFR calc Af Amer 40 (*)    All other components within normal limits  URINALYSIS, ROUTINE W REFLEX MICROSCOPIC - Abnormal; Notable for the following:    Color, Urine AMBER (*)    APPearance CLOUDY (*)    Specific Gravity, Urine 1.031 (*)    Glucose, UA 100 (*)    Hgb urine dipstick LARGE (*)    Bilirubin Urine SMALL (*)    Ketones, ur 15 (*)    Protein, ur >300 (*)    All other components within normal limits  GLUCOSE, CAPILLARY - Abnormal; Notable for the following:    Glucose-Capillary 172 (*)    All other components within normal limits  URINE MICROSCOPIC-ADD ON - Abnormal; Notable for the following:    Squamous Epithelial / LPF FEW (*)    Bacteria, UA FEW (*)    Casts HYALINE CASTS (*)    All other components within normal limits  URINE CULTURE  LACTIC ACID, PLASMA    Dg Chest 2 View  12/28/2012   *RADIOLOGY REPORT*  Clinical Data: Fever and cough  CHEST - 2 VIEW  Comparison: 08/12/2012  Findings: Two-view exam shows patchy left perihilar and basilar airspace disease.  No focal consolidation evident within the right lung.  No pulmonary edema. The cardiopericardial silhouette is enlarged.  No evidence for pleural effusion. Imaged bony structures of the thorax are intact.  IMPRESSION: Patchy airspace disease in the left perihilar region and left base suggests pneumonia.  Follow-up imaging is recommended to ensure complete resolution.   Original Report Authenticated By: Misty Stanley, M.D.   Dg Chest Port 1 View  12/28/2012   *RADIOLOGY REPORT*  Clinical Data: Fever, former smoking history, diabetes  PORTABLE CHEST - 1 VIEW  Comparison: Chest x-ray of 12/28/2012 and 08/12/2012  Findings: The rounded mass-like opacity in the left perihilar region is again noted and may represent pneumonia.  However malignancy cannot be excluded and follow-up chest x-ray or CT of the chest is recommended.  There is cardiomegaly present and there is a question of very mild pulmonary vascular congestion as well. No acute bony abnormality is seen.  IMPRESSION:  1.  Rounded opacity in the left perihilar region may represent pneumonia but a malignancy cannot be excluded.  Recommend follow-up chest x-ray and possible CT of the chest. 2.  Cardiomegaly.  Minimal pulmonary vascular congestion.   Original Report Authenticated By: Ivar Drape, M.D.   1. Community acquired pneumonia   2. UTI (lower urinary tract infection)     MDM  Elderly man with pneumonia and fever. Urine specific gravity is elevated. Urine white count and bacteria are elevated. Urine culture has been ordered. Lactate normal. Vital signs are stable. He has persistent tachypnea with normal oxygenation on nasal cannula oxygen. He needs admission to a telemetry bed for monitoring and IV antibiotics.    Plan: Admit  Richarda Blade, MD 12/28/12 [place], MD 12/29/12 [phone]

## 2012-12-28 NOTE — ED Provider Notes (Signed)
Medical screening examination/treatment/procedure(s) were performed by resident physician or non-physician practitioner and as supervising physician I was immediately available for consultation/collaboration.   Pauline Good MD.   Billy Fischer, MD 12/28/12 [phone]

## 2012-12-28 NOTE — ED Notes (Addendum)
Reports he had been sick 3-4 weeks ago w a respiratory infection, but had been well until yesterday, whne he developed cough, fever. Presently having chills. Breath shallow, breath sounds distant Wife also concerned for him being shaky, uncoordinated today

## 2012-12-28 NOTE — ED Notes (Signed)
Pt was sent from [facility] with fever, chills, and cough.  Pt was sent down here with complaints of confusion and balance problems.  Pt had problems checking blood sugar and getting hearing aids which he has never had a problem before.  Xray showed CAPneumonia.

## 2012-12-28 NOTE — ED Provider Notes (Signed)
History    CSN: [id number] Arrival date & time 12/28/12  1343  First MD Initiated Contact with Patient 12/28/12 1433     Chief Complaint  Patient presents with  . Fever   (Consider location/radiation/quality/duration/timing/severity/associated sxs/prior Treatment) HPI Comments: 77-year-old male is brought into the emergency department by his wife. This morning he awoke with a new cough. He was coughing several times during the morning. She noticed last night she felt hot this morning had a temperature of 100.8. She is also concerned because he was unable to put his hearing aid in to see able to check his blood sugar. She states this is a departure in his mentation and states that he was exhibiting disorientation and confusion earlier this morning. She also states that his gait has changed, is less stable and it is more difficult for him to get on the table and off. Patient denies chest discomfort or shortness of breath. There is no history of tick exposure. He was treated for a respiratory infection 1 month ago and improved.  Past Medical History  Diagnosis Date  . Diabetes mellitus   . Hypertension   . COPD (chronic obstructive pulmonary disease)   . Kidney disease     mild  . Hyperlipidemia   . Hypertrophy of prostate without urinary obstruction and other lower urinary tract symptoms (LUTS)   . Hypercalcemia   . Edema   . Ventral hernia, unspecified, without mention of obstruction or gangrene   . Anxiety   . Depression   . Allergy   . Unspecified sleep apnea   . Hypertrophy of prostate without urinary obstruction and other lower urinary tract symptoms (LUTS)   . Anemia, unspecified   . Unspecified disorder of kidney and ureter   . Alzheimer's disease   . Chronic maxillary sinusitis    Past Surgical History  Procedure Laterality Date  . Hernia repair    . Eyplosatory lap  2002   Family History  Problem Relation Age of Onset  . Pancreatic cancer Sister   . Heart disease  Father   . Hypertension Brother    History  Substance Use Topics  . Smoking status: Former Smoker -- 30 years    Quit date: 07/08/1988  . Smokeless tobacco: Not on file     Comment: 1-2 ppd  . Alcohol Use: No    Review of Systems  Constitutional: Positive for fever and activity change.  HENT: Positive for hearing loss. Negative for ear pain, sore throat, rhinorrhea, sneezing, postnasal drip and ear discharge.   Respiratory: Positive for cough. Negative for shortness of breath and wheezing.   Cardiovascular: Negative for chest pain and palpitations.  Gastrointestinal: Negative.   Genitourinary: Negative.   Musculoskeletal: Positive for myalgias and gait problem.  Neurological: Positive for weakness. Negative for seizures, syncope and headaches.       Apply states that his gait has changed and less stable than usual. When the patient was being helped to the bed his wife stated that usually he would not need to be helped onto the bed and again is weaker and less stable than usual.  Psychiatric/Behavioral: Positive for confusion.       The patient has a history of mild dementia according to his wife. However this morning he had a change in mentation problem with coordinating his  finger sticks for blood sugar and placing his hearing aid in his ears    Allergies  Review of patient's allergies indicates no known allergies.  Home  Medications   Current Outpatient Rx  Name  Route  Sig  Dispense  Refill  . amLODipine (NORVASC) 10 MG tablet   Oral   Take 10 mg by mouth daily.         . Blood Glucose Monitoring Suppl (ONE TOUCH ULTRA SYSTEM KIT) W/DEVICE KIT   Does not apply   1 kit by Does not apply route once. Use as Directed. DX: 250.02   1 each   0   . budesonide-formoterol (SYMBICORT) 80-4.5 MCG/ACT inhaler   Inhalation   Inhale 2 puffs into the lungs 2 (two) times daily.         . Fenofibric Acid 35 MG TABS   Oral   Take 35 mg by mouth daily.         Marland Kitchen glimepiride  (AMARYL) 4 MG tablet   Oral   Take 4 mg by mouth daily before breakfast.         . glucose blood (BAYER CONTOUR TEST) test strip      Use as directed to check blood sugar to control diabetes. 250.00   100 each   3   . glucose blood test strip      Use as instructed once daily to help control blood sugar.   100 each   12   . lisinopril (PRINIVIL,ZESTRIL) 5 MG tablet   Oral   Take 1 tablet (5 mg total) by mouth daily.   90 tablet   3   . loratadine (CLARITIN) 10 MG tablet   Oral   Take 10 mg by mouth daily as needed.         Marland Kitchen losartan (COZAAR) 25 MG tablet   Oral   Take 1 tablet (25 mg total) by mouth daily.   90 tablet   3   . metoprolol tartrate (LOPRESSOR) 25 MG tablet      Take one tablet twice a day for blood pressure   180 tablet   3   . mometasone (NASONEX) 50 MCG/ACT nasal spray   Nasal   Place 2 sprays into the nose daily.   17 g   3   . montelukast (SINGULAIR) 10 MG tablet   Oral   Take 1 tablet (10 mg total) by mouth at bedtime.   90 tablet   3   . montelukast (SINGULAIR) 10 MG tablet      TAKE 1 TABLET BY MOUTH AT BEDTIME   30 tablet   1   . PARoxetine (PAXIL) 20 MG tablet   Oral   Take 20 mg by mouth every morning.         . traZODone (DESYREL) 150 MG tablet   Oral   Take 150 mg by mouth at bedtime.         Marland Kitchen albuterol (PROVENTIL HFA;VENTOLIN HFA) 108 (90 BASE) MCG/ACT inhaler   Inhalation   Inhale 2 puffs into the lungs every 6 (six) hours as needed for wheezing or shortness of breath.   3.7 g   3   . moxifloxacin (AVELOX) 400 MG tablet   Oral   Take 1 tablet (400 mg total) by mouth daily.   7 tablet   0    BP 167/80  Pulse 88  Temp(Src) 99.3 F (37.4 C) (Oral)  Resp 20  SpO2 94% Physical Exam  Nursing note and vitals reviewed. Constitutional: He appears well-developed and well-nourished. No distress.  HENT:  Mouth/Throat: No oropharyngeal exudate.  Bilateral TM's nl OP with minor erythema  and copious  clear,frothy PND  Eyes: Conjunctivae and EOM are normal.  Neck: Normal range of motion. Neck supple.  Cardiovascular: Normal rate and normal heart sounds.   Pulmonary/Chest: Effort normal.  Diminished breath sounds bilaterally. Generally fair to poor air movement. Faint, distant crackles in the left mid to lower fields.  Lymphadenopathy:    He has no cervical adenopathy.  Neurological: He is alert.  Skin: Skin is warm and dry.    ED Course  Procedures (including critical care time) Labs Reviewed  POCT URINALYSIS DIP (DEVICE) - Abnormal; Notable for the following:    Bilirubin Urine SMALL (*)    Hgb urine dipstick MODERATE (*)    Protein, ur >=300 (*)    All other components within normal limits   Dg Chest 2 View  12/28/2012   *RADIOLOGY REPORT*  Clinical Data: Fever and cough  CHEST - 2 VIEW  Comparison: 08/12/2012  Findings: Two-view exam shows patchy left perihilar and basilar airspace disease.  No focal consolidation evident within the right lung.  No pulmonary edema. The cardiopericardial silhouette is enlarged.  No evidence for pleural effusion. Imaged bony structures of the thorax are intact.  IMPRESSION: Patchy airspace disease in the left perihilar region and left base suggests pneumonia.  Follow-up imaging is recommended to ensure complete resolution.   Original Report Authenticated By: Misty Stanley, M.D.   Dg Chest 2 View  12/28/2012   *RADIOLOGY REPORT*  Clinical Data: Fever and cough  CHEST - 2 VIEW  Comparison: 08/12/2012  Findings: Two-view exam shows patchy left perihilar and basilar airspace disease.  No focal consolidation evident within the right lung.  No pulmonary edema. The cardiopericardial silhouette is enlarged.  No evidence for pleural effusion. Imaged bony structures of the thorax are intact.  IMPRESSION: Patchy airspace disease in the left perihilar region and left base suggests pneumonia.  Follow-up imaging is recommended to ensure complete resolution.   Original  Report Authenticated By: Misty Stanley, M.D.    1. Community acquired pneumonia   2. Episodic confusion   3. Balance problems     MDM  Transfer to [facility] ED for evaluation of pneumonia and change in mentation with confusion and discoordination earlier in the day.   Janne Napoleon, NP 12/28/12 1658  Janne Napoleon, NP 12/28/12 1659

## 2012-12-29 ENCOUNTER — Encounter (HOSPITAL_COMMUNITY): Payer: Self-pay | Admitting: Internal Medicine

## 2012-12-29 ENCOUNTER — Inpatient Hospital Stay (HOSPITAL_COMMUNITY): Payer: PRIVATE HEALTH INSURANCE

## 2012-12-29 DIAGNOSIS — E119 Type 2 diabetes mellitus without complications: Secondary | ICD-10-CM | POA: Diagnosis present

## 2012-12-29 DIAGNOSIS — J449 Chronic obstructive pulmonary disease, unspecified: Secondary | ICD-10-CM

## 2012-12-29 DIAGNOSIS — J189 Pneumonia, unspecified organism: Principal | ICD-10-CM

## 2012-12-29 DIAGNOSIS — E1129 Type 2 diabetes mellitus with other diabetic kidney complication: Secondary | ICD-10-CM | POA: Diagnosis present

## 2012-12-29 DIAGNOSIS — N390 Urinary tract infection, site not specified: Secondary | ICD-10-CM

## 2012-12-29 LAB — BASIC METABOLIC PANEL
GFR calc Af Amer: 40 mL/min — ABNORMAL LOW (ref >90)
GFR calc non Af Amer: 35 mL/min — ABNORMAL LOW (ref >90)
Potassium: 3.9 mEq/L (ref 3.5–5.1)
Sodium: 138 mEq/L (ref 135–145)

## 2012-12-29 LAB — URINE CULTURE
Colony Count: NO GROWTH
Culture: NO GROWTH

## 2012-12-29 LAB — URINE MICROSCOPIC-ADD ON

## 2012-12-29 LAB — URINALYSIS, ROUTINE W REFLEX MICROSCOPIC
Glucose, UA: NEGATIVE mg/dL
Specific Gravity, Urine: 1.025 (ref 1.005–1.030)
pH: 5.5 (ref 5.0–8.0)

## 2012-12-29 LAB — CBC
Hemoglobin: 14.5 g/dL (ref 13.0–17.0)
MCHC: 35.1 g/dL (ref 30.0–36.0)
RBC: 4.46 MIL/uL (ref 4.22–5.81)

## 2012-12-29 LAB — GLUCOSE, CAPILLARY
Glucose-Capillary: 79 mg/dL (ref 70–99)
Glucose-Capillary: 114 mg/dL — ABNORMAL HIGH (ref 70–99)

## 2012-12-29 MED ORDER — GLIMEPIRIDE 4 MG PO TABS
4.0000 mg | ORAL_TABLET | Freq: Every day | ORAL | Status: DC
  Administered 2012-12-29 – 2012-12-30 (×2): 4 mg via ORAL
  Filled 2012-12-29 (×3): qty 1

## 2012-12-29 MED ORDER — METOPROLOL TARTRATE 25 MG PO TABS
25.0000 mg | ORAL_TABLET | Freq: Two times a day (BID) | ORAL | Status: DC
  Administered 2012-12-29 – 2013-01-03 (×12): 25 mg via ORAL
  Filled 2012-12-29 (×13): qty 1

## 2012-12-29 MED ORDER — HYDRALAZINE HCL 25 MG PO TABS
25.0000 mg | ORAL_TABLET | Freq: Three times a day (TID) | ORAL | Status: DC
  Administered 2012-12-29 – 2013-01-04 (×19): 25 mg via ORAL
  Filled 2012-12-29 (×22): qty 1

## 2012-12-29 MED ORDER — SODIUM CHLORIDE 0.9 % IJ SOLN
3.0000 mL | Freq: Two times a day (BID) | INTRAMUSCULAR | Status: DC
  Administered 2012-12-30 – 2013-01-03 (×9): 3 mL via INTRAVENOUS

## 2012-12-29 MED ORDER — SODIUM CHLORIDE 0.9 % IV SOLN
INTRAVENOUS | Status: AC
  Administered 2012-12-29: 11:00:00 via INTRAVENOUS

## 2012-12-29 MED ORDER — ONDANSETRON HCL 4 MG/2ML IJ SOLN: 4.0000 mg | Freq: Four times a day (QID) | INTRAMUSCULAR | Status: DC | PRN

## 2012-12-29 MED ORDER — LOSARTAN POTASSIUM 25 MG PO TABS
25.0000 mg | ORAL_TABLET | Freq: Every day | ORAL | Status: DC
  Filled 2012-12-29: qty 1

## 2012-12-29 MED ORDER — INSULIN ASPART 100 UNIT/ML ~~LOC~~ SOLN
0.0000 [IU] | Freq: Three times a day (TID) | SUBCUTANEOUS | Status: DC
  Administered 2012-12-29 – 2012-12-31 (×3): 2 [IU] via SUBCUTANEOUS
  Administered 2012-12-31: 1 [IU] via SUBCUTANEOUS
  Administered 2013-01-01 – 2013-01-04 (×9): 2 [IU] via SUBCUTANEOUS
  Administered 2013-01-04: 1 [IU] via SUBCUTANEOUS

## 2012-12-29 MED ORDER — FENOFIBRATE 54 MG PO TABS
54.0000 mg | ORAL_TABLET | Freq: Every day | ORAL | Status: DC
  Administered 2012-12-29 – 2013-01-04 (×7): 54 mg via ORAL
  Filled 2012-12-29 (×7): qty 1

## 2012-12-29 MED ORDER — LISINOPRIL 5 MG PO TABS
5.0000 mg | ORAL_TABLET | Freq: Every day | ORAL | Status: DC
  Filled 2012-12-29: qty 1

## 2012-12-29 MED ORDER — ENOXAPARIN SODIUM 40 MG/0.4ML ~~LOC~~ SOLN
40.0000 mg | SUBCUTANEOUS | Status: DC
Start: 2012-12-29 — End: 2012-12-31
  Administered 2012-12-29 – 2012-12-31 (×3): 40 mg via SUBCUTANEOUS
  Filled 2012-12-29 (×3): qty 0.4

## 2012-12-29 MED ORDER — ACETAMINOPHEN 325 MG PO TABS
650.0000 mg | ORAL_TABLET | Freq: Four times a day (QID) | ORAL | Status: DC | PRN
  Administered 2012-12-29 – 2013-01-02 (×4): 650 mg via ORAL
  Filled 2012-12-29 (×4): qty 2

## 2012-12-29 MED ORDER — BUDESONIDE-FORMOTEROL FUMARATE 80-4.5 MCG/ACT IN AERO
2.0000 | INHALATION_SPRAY | Freq: Two times a day (BID) | RESPIRATORY_TRACT | Status: DC
  Administered 2012-12-29 – 2013-01-04 (×12): 2 via RESPIRATORY_TRACT
  Filled 2012-12-29: qty 6.9

## 2012-12-29 MED ORDER — LORATADINE 10 MG PO TABS
10.0000 mg | ORAL_TABLET | Freq: Every day | ORAL | Status: DC | PRN
  Filled 2012-12-29: qty 1

## 2012-12-29 MED ORDER — FENOFIBRIC ACID 35 MG PO TABS: 35.0000 mg | ORAL_TABLET | Freq: Every evening | ORAL | Status: DC

## 2012-12-29 MED ORDER — AMLODIPINE BESYLATE 10 MG PO TABS
10.0000 mg | ORAL_TABLET | Freq: Every day | ORAL | Status: DC
  Administered 2012-12-29 – 2013-01-04 (×7): 10 mg via ORAL
  Filled 2012-12-29 (×7): qty 1

## 2012-12-29 MED ORDER — DEXTROSE 5 % IV SOLN
500.0000 mg | INTRAVENOUS | Status: DC
  Administered 2012-12-29 – 2013-01-03 (×6): 500 mg via INTRAVENOUS
  Filled 2012-12-29 (×7): qty 500

## 2012-12-29 MED ORDER — ONDANSETRON HCL 4 MG PO TABS: 4.0000 mg | ORAL_TABLET | Freq: Four times a day (QID) | ORAL | Status: DC | PRN

## 2012-12-29 MED ORDER — DEXTROSE 5 % IV SOLN
1.0000 g | INTRAVENOUS | Status: DC
  Administered 2012-12-29 – 2013-01-03 (×6): 1 g via INTRAVENOUS
  Filled 2012-12-29 (×6): qty 10

## 2012-12-29 MED ORDER — SODIUM CHLORIDE 0.9 % IJ SOLN
3.0000 mL | Freq: Two times a day (BID) | INTRAMUSCULAR | Status: DC
  Administered 2012-12-29 – 2012-12-30 (×2): 3 mL via INTRAVENOUS

## 2012-12-29 MED ORDER — PAROXETINE HCL 20 MG PO TABS
20.0000 mg | ORAL_TABLET | ORAL | Status: DC
  Administered 2012-12-29 – 2013-01-04 (×7): 20 mg via ORAL
  Filled 2012-12-29 (×8): qty 1

## 2012-12-29 MED ORDER — ACETAMINOPHEN 650 MG RE SUPP: 650.0000 mg | Freq: Four times a day (QID) | RECTAL | Status: DC | PRN

## 2012-12-29 MED ORDER — FLUTICASONE PROPIONATE 50 MCG/ACT NA SUSP
1.0000 | Freq: Every day | NASAL | Status: DC
  Administered 2012-12-29 – 2013-01-04 (×8): 1 via NASAL
  Filled 2012-12-29: qty 16

## 2012-12-29 MED ORDER — ALBUTEROL SULFATE HFA 108 (90 BASE) MCG/ACT IN AERS: 2.0000 | INHALATION_SPRAY | Freq: Four times a day (QID) | RESPIRATORY_TRACT | Status: DC | PRN

## 2012-12-29 MED ORDER — MONTELUKAST SODIUM 10 MG PO TABS
10.0000 mg | ORAL_TABLET | Freq: Every day | ORAL | Status: DC
  Administered 2012-12-29 – 2013-01-03 (×7): 10 mg via ORAL
  Filled 2012-12-29 (×8): qty 1

## 2012-12-29 MED ORDER — TRAZODONE HCL 150 MG PO TABS
150.0000 mg | ORAL_TABLET | Freq: Every day | ORAL | Status: DC
  Administered 2012-12-29 – 2013-01-03 (×7): 150 mg via ORAL
  Filled 2012-12-29 (×8): qty 1

## 2012-12-29 MED ORDER — ONDANSETRON HCL 4 MG/2ML IJ SOLN: 4.0000 mg | Freq: Three times a day (TID) | INTRAMUSCULAR | Status: DC | PRN

## 2012-12-29 NOTE — Progress Notes (Signed)
Pt admitted from ED per stretcher with ongoing IV antibiotic,accompanied by the Nurse Tech. AOx4, oriented to room and call bell, put on telemetry. Denies any chest pain. VS taken.

## 2012-12-29 NOTE — ED Notes (Signed)
Attempted report 

## 2012-12-29 NOTE — Progress Notes (Signed)
[facility]                                                                                Patient Demographics  Ian [name], is a 77 y.o. male, DOB - 04-25-1936, [id number], [id number]  Admit date - 12/28/2012  Admitting Physician Rise Patience, MD  Outpatient Primary MD for the patient is [name], [name], DO  LOS - 1   Chief Complaint  Patient presents with  . Pneumonia        Assessment & Plan    1.CAP - will obtain blood cultures, will order sputum cultures, continue empiric Rocephin and azithromycin, will need two-view chest x-ray in 2 weeks to document resolution of his infiltrate post discharge. Continue supportive care with gentle IV fluids, nebs and oxygen as needed.    2. Diverticulitis type II. Continue home medications and add insulin sliding scale.  No results found for this basename: HGBA1C    CBG (last 3)   Recent Labs  12/28/12 1702 12/28/12 1816  GLUCAP 167* 172*      3. Hypertension. Stable, will hold his ACE/ARB as he has mild renal insufficiency, will add hydralazine instead. Continue Lopressor, Norvasc and monitor.    4. History of COPD at baseline. No wheezing.    5. Renal insufficiency. Last creatinine is 1.6 in the system which is a scan report, question if he has underlying chronic kidney disease stage IV. Creatinine currently slightly elevated, will obtain urine electrolytes, will hold ACE and ARB, repeat BMP in the morning after gentle IV fluids.     Code Status: Full  Family Communication: None present  Disposition Plan: Home   Procedures     Consults     DVT Prophylaxis  Lovenox    Lab Results  Component Value Date   PLT 134* 12/29/2012    Medications  Scheduled Meds: . amLODipine  10 mg Oral Daily  . azithromycin  500 mg Intravenous Q24H  . budesonide-formoterol  2 puff Inhalation BID  . cefTRIAXone (ROCEPHIN)  IV  1 g Intravenous Q24H  . enoxaparin (LOVENOX) injection  40 mg Subcutaneous  Q24H  . fenofibrate  54 mg Oral Daily  . fluticasone  1 spray Each Nare Daily  . glimepiride  4 mg Oral QAC breakfast  . insulin aspart  0-9 Units Subcutaneous TID WC  . lisinopril  5 mg Oral Daily  . losartan  25 mg Oral Daily  . metoprolol tartrate  25 mg Oral BID  . montelukast  10 mg Oral QHS  . PARoxetine  20 mg Oral BH-[id number]  . sodium chloride  3 mL Intravenous Q12H  . sodium chloride  3 mL Intravenous Q12H  . traZODone  150 mg Oral QHS   Continuous Infusions:  PRN Meds:.acetaminophen, acetaminophen, albuterol, loratadine, ondansetron (ZOFRAN) IV, ondansetron  Antibiotics     Anti-infectives   Start     Dose/Rate Route Frequency Ordered Stop   12/29/12 2200  cefTRIAXone (ROCEPHIN) 1 g in dextrose 5 % 50 mL IVPB     1 g 100 mL/hr over 30 Minutes Intravenous Every 24 hours 12/29/12 0054     12/29/12 2200  azithromycin (ZITHROMAX) 500 mg in dextrose  5 % 250 mL IVPB     500 mg 250 mL/hr over 60 Minutes Intravenous Every 24 hours 12/29/12 0054     12/28/12 2200  cefTRIAXone (ROCEPHIN) 1 g in dextrose 5 % 50 mL IVPB     1 g 100 mL/hr over 30 Minutes Intravenous  Once 12/28/12 2152 12/28/12 2251   12/28/12 2200  azithromycin (ZITHROMAX) 500 mg in dextrose 5 % 250 mL IVPB     500 mg 250 mL/hr over 60 Minutes Intravenous  Once 12/28/12 2152 12/28/12 2349       Time Spent in minutes   35   Zanaya Baize K M.D on 12/29/2012 at 9:44 AM  Between 7am to 7pm - Pager - [phone]  After 7pm go to [email or web address] - password [email or web address]  And look for the night coverage person covering for me after hours  [facility] Office  [phone]    Subjective:   [place] Labs today has, No headache, No chest pain, No abdominal pain - No Nausea, No new weakness tingling or numbness, No Cough - SOB.    Objective:   Filed Vitals:   12/29/12 0004 12/29/12 0113 12/29/12 0515 12/29/12 0827  BP: 178/89 171/73 159/98 136/49  Pulse:  96 105 77  Temp:  99 F (37.2 C) 100.9 F (38.3  C) 101 F (38.3 C)  TempSrc:  Oral Oral Oral  Resp: 29 22 22    Height:  5\' 6"  (1.676 m)    Weight:  97.75 kg (215 lb 8 oz)    SpO2: 95% 96% 96% 96%    Wt Readings from Last 3 Encounters:  12/29/12 97.75 kg (215 lb 8 oz)  11/26/12 100.699 kg (222 lb)  11/10/12 100.154 kg (220 lb 12.8 oz)     Intake/Output Summary (Last 24 hours) at 12/29/12 0944 Last data filed at 12/29/12 0837  Gross per 24 hour  Intake    600 ml  Output    400 ml  Net    200 ml    Exam Awake Alert, Oriented X 3, No new F.N deficits, Normal affect [place].AT,PERRAL Supple Neck,No JVD, No cervical lymphadenopathy appriciated.  Symmetrical Chest wall movement, Good air movement bilaterally, CTAB RRR,No Gallops,Rubs or new Murmurs, No Parasternal Heave +ve B.Sounds, Abd Soft, Non tender, No organomegaly appriciated, No rebound - guarding or rigidity. No Cyanosis, Clubbing or edema, No new Rash or bruise     Data Review   Micro Results No results found for this or any previous visit (from the past 240 hour(s)).  Radiology Reports Dg Chest 2 View  12/28/2012   *RADIOLOGY REPORT*  Clinical Data: Fever and cough  CHEST - 2 VIEW  Comparison: 08/12/2012  Findings: Two-view exam shows patchy left perihilar and basilar airspace disease.  No focal consolidation evident within the right lung.  No pulmonary edema. The cardiopericardial silhouette is enlarged.  No evidence for pleural effusion. Imaged bony structures of the thorax are intact.  IMPRESSION: Patchy airspace disease in the left perihilar region and left base suggests pneumonia.  Follow-up imaging is recommended to ensure complete resolution.   Original Report Authenticated By: Misty Stanley, M.D.   Ct Chest Wo Contrast  12/29/2012   *RADIOLOGY REPORT*  Clinical Data: Follow-up abnormal chest x-ray.  CT CHEST WITHOUT CONTRAST  Technique:  Multidetector CT imaging of the chest was performed following the standard protocol without IV contrast.  Comparison: Chest  x-ray 12/28/2012  Findings: Irregular airspace opacity noted in the lingula of the left upper lobe corresponding  to the density seen on prior chest x- ray.  I suspect this represents pneumonia, but follow-up after treatment is warranted to exclude malignancy.  The remainder of the lungs are clear.  No pleural effusions.  Mild cardiomegaly. Scattered coronary artery and aortic calcifications.  No evidence of aortic aneurysm. No mediastinal, hilar, or axillary adenopathy. Visualized thyroid and chest wall soft tissues unremarkable.  Imaging into the upper abdomen shows no acute findings.  Small layering gallstones partially imaged within the gallbladder.  On no acute bony abnormality.  IMPRESSION: Irregular airspace density within the lingula corresponding to the abnormality on chest x-ray.  I favor this represents pneumonia, recommend follow up after treatment to assure resolution and exclude malignancy.  Cardiomegaly, coronary artery disease.  Cholelithiasis.   Original Report Authenticated By: [name], M.D.   Dg Chest Port 1 View  12/28/2012   *RADIOLOGY REPORT*  Clinical Data: Fever, former smoking history, diabetes  PORTABLE CHEST - 1 VIEW  Comparison: Chest x-ray of 12/28/2012 and 08/12/2012  Findings: The rounded mass-like opacity in the left perihilar region is again noted and may represent pneumonia.  However malignancy cannot be excluded and follow-up chest x-ray or CT of the chest is recommended.  There is cardiomegaly present and there is a question of very mild pulmonary vascular congestion as well. No acute bony abnormality is seen.  IMPRESSION:  1.  Rounded opacity in the left perihilar region may represent pneumonia but a malignancy cannot be excluded.  Recommend follow-up chest x-ray and possible CT of the chest. 2.  Cardiomegaly.  Minimal pulmonary vascular congestion.   Original Report Authenticated By: Ivar Drape, M.D.    [facility] 12/28/12 1747 12/29/12 0448  WBC 15.0*  14.8*  HGB 15.9 14.5  HCT 45.2 41.3  PLT 160 134*  MCV 92.1 92.6  MCH 32.4 32.5  MCHC 35.2 35.1  RDW 13.6 13.7    Chemistries   Recent Labs Lab 12/28/12 1747 12/29/12 0448  NA 139 138  K 4.4 3.9  CL 99 99  CO2 29 28  GLUCOSE 175* 175*  BUN 24* 28*  CREATININE 1.82* 1.83*  CALCIUM 11.1* 10.0  AST 14  --   ALT 14  --   ALKPHOS 56  --   BILITOT 0.7  --    ------------------------------------------------------------------------------------------------------------------ estimated creatinine clearance is 38.8 ml/min (by C-G formula based on Cr of 1.83). ------------------------------------------------------------------------------------------------------------------ No results found for this basename: HGBA1C,  in the last 72 hours ------------------------------------------------------------------------------------------------------------------ No results found for this basename: CHOL, HDL, LDLCALC, TRIG, CHOLHDL, LDLDIRECT,  in the last 72 hours ------------------------------------------------------------------------------------------------------------------ No results found for this basename: TSH, T4TOTAL, FREET3, T3FREE, THYROIDAB,  in the last 72 hours ------------------------------------------------------------------------------------------------------------------ No results found for this basename: VITAMINB12, FOLATE, FERRITIN, TIBC, IRON, RETICCTPCT,  in the last 72 hours  Coagulation profile No results found for this basename: INR, PROTIME,  in the last 168 hours  No results found for this basename: DDIMER,  in the last 72 hours  Cardiac Enzymes No results found for this basename: CK, CKMB, TROPONINI, MYOGLOBIN,  in the last 168 hours ------------------------------------------------------------------------------------------------------------------ No components found with this basename: POCBNP,

## 2012-12-29 NOTE — H&P (Signed)
[facility] History and Physical  Ian [name] [id number] DOB: [birth year] DOA: 12/28/2012  Referring physician: ER physician. PCP: Hollace Kinnier, DO  Chief Complaint: Cough and fever with confusion.  HPI: Ian [name] is a 77 y.o. male history of diabetes mellitus type 2, hypertension, COPD, hyperlipidemia workup with a cough yesterday. Patient also had mild fever. Patient was found to be confused and had some difficulty walking. Since patient's symptoms persisted he was brought to the ER. In the ER patient was found to be febrile and blood work showed leukocytosis and chest x-ray showed left sided opacity concerning for pneumonia. Patient has been started on antibiotics for pneumonia and UA were showing WBC. Urine culture has been sent. Presently patient is nonfocal and is alert awake and oriented. Patient has history of congenital deafness. Patient otherwise denies any nausea vomiting abdominal pain dysuria discharges or diarrhea. Patient's wife states that last month patient had lot of postnasal drip and cough and was prescribed antibiotics but had not taken it.  Review of Systems: As presented in the history of presenting illness, rest negative.  Past Medical History  Diagnosis Date  . Diabetes mellitus   . Hypertension   . COPD (chronic obstructive pulmonary disease)   . Kidney disease     mild  . Hyperlipidemia   . Hypertrophy of prostate without urinary obstruction and other lower urinary tract symptoms (LUTS)   . Hypercalcemia   . Edema   . Ventral hernia, unspecified, without mention of obstruction or gangrene   . Anxiety   . Depression   . Allergy   . Unspecified sleep apnea   . Hypertrophy of prostate without urinary obstruction and other lower urinary tract symptoms (LUTS)   . Anemia, unspecified   . Unspecified disorder of kidney and ureter   . Alzheimer's disease   . Chronic maxillary sinusitis    Past Surgical History  Procedure Laterality Date  . Hernia  repair    . Eyplosatory lap  2002   Social History:  reports that he quit smoking about 24 years ago. He does not have any smokeless tobacco history on file. He reports that he does not drink alcohol or use illicit drugs. Home. where does patient live-- Can do ADLs. Can patient participate in ADLs?  No Known Allergies  Family History  Problem Relation Age of Onset  . Pancreatic cancer Sister   . Heart disease Father   . Hypertension Brother       Prior to Admission medications   Medication Sig Start Date End Date Taking? Authorizing Provider  albuterol (PROVENTIL HFA;VENTOLIN HFA) 108 (90 BASE) MCG/ACT inhaler Inhale 2 puffs into the lungs every 6 (six) hours as needed for wheezing or shortness of breath. 11/26/12  Yes [name] L [name], DO  amLODipine (NORVASC) 10 MG tablet Take 10 mg by mouth daily.   Yes Historical Provider, MD  budesonide-formoterol (SYMBICORT) 80-4.5 MCG/ACT inhaler Inhale 2 puffs into the lungs 2 (two) times daily.   Yes Historical Provider, MD  Fenofibric Acid 35 MG TABS Take 35 mg by mouth every evening.    Yes Historical Provider, MD  glimepiride (AMARYL) 4 MG tablet Take 4 mg by mouth daily before breakfast.   Yes Historical Provider, MD  lisinopril (PRINIVIL,ZESTRIL) 5 MG tablet Take 1 tablet (5 mg total) by mouth daily. 11/26/12  Yes [name] L [name], DO  loratadine (CLARITIN) 10 MG tablet Take 10 mg by mouth daily as needed for allergies.    Yes Historical Provider, MD  losartan (COZAAR) 25 MG tablet Take 1 tablet (25 mg total) by mouth daily. 11/26/12  Yes [name] L [name], DO  metoprolol tartrate (LOPRESSOR) 25 MG tablet Take 25 mg by mouth 2 (two) times daily.   Yes Historical Provider, MD  mometasone (NASONEX) 50 MCG/ACT nasal spray Place 2 sprays into the nose daily. 11/26/12 11/26/13 Yes [name] L [name], DO  montelukast (SINGULAIR) 10 MG tablet Take 1 tablet (10 mg total) by mouth at bedtime. 11/26/12  Yes [name] L [name], DO  PARoxetine (PAXIL) 20 MG tablet Take 20  mg by mouth every morning.   Yes Historical Provider, MD  traZODone (DESYREL) 150 MG tablet Take 150 mg by mouth at bedtime.   Yes Historical Provider, MD  Blood Glucose Monitoring Suppl (ONE TOUCH ULTRA SYSTEM KIT) W/DEVICE KIT 1 kit by Does not apply route once. Use as Directed. DX: 250.02 12/22/12   Pricilla Larsson, NP  glucose blood (BAYER CONTOUR TEST) test strip Use as directed to check blood sugar to control diabetes. 250.00 11/26/12   [name] L [name], DO  glucose blood test strip Use as instructed once daily to help control blood sugar. 12/23/12   Blanchie Serve, MD   Physical Exam: Filed Vitals:   12/28/12 2145 12/28/12 2245 12/28/12 2345 12/29/12 0004  BP: 161/89 178/80 172/80 178/89  Pulse: 79 89 93   Temp:      TempSrc:      Resp: 29 34 23 29  SpO2: 97% 95% 94% 95%     General:  Well-developed and nourished.  Eyes: Anicteric no pallor.  ENT: No discharge from ears eyes nose mouth.  Neck: No mass felt.  Cardiovascular: S1-S2 heard.  Respiratory: No rhonchi or crepitations.  Abdomen: Soft nontender bowel sounds present.  Skin: No rash.  Musculoskeletal: No edema.  Psychiatric: Appears normal.  Neurologic: Alert awake oriented to time place and person. Moves all extremities.  Labs on Admission:  Basic Metabolic Panel:  Recent Labs Lab 12/28/12 1747  NA 139  K 4.4  CL 99  CO2 29  GLUCOSE 175*  BUN 24*  CREATININE 1.82*  CALCIUM 11.1*   Liver Function Tests:  Recent Labs Lab 12/28/12 1747  AST 14  ALT 14  ALKPHOS 56  BILITOT 0.7  PROT 8.6*  ALBUMIN 4.0   No results found for this basename: LIPASE, AMYLASE,  in the last 168 hours No results found for this basename: AMMONIA,  in the last 168 hours CBC:  Recent Labs Lab 12/28/12 1747  WBC 15.0*  HGB 15.9  HCT 45.2  MCV 92.1  PLT 160   Cardiac Enzymes: No results found for this basename: CKTOTAL, CKMB, CKMBINDEX, TROPONINI,  in the last 168 hours  BNP (last 3 results) No results found  for this basename: PROBNP,  in the last 8760 hours CBG:  Recent Labs Lab 12/28/12 1702 12/28/12 1816  GLUCAP 167* 172*    Radiological Exams on Admission: Dg Chest 2 View  12/28/2012   *RADIOLOGY REPORT*  Clinical Data: Fever and cough  CHEST - 2 VIEW  Comparison: 08/12/2012  Findings: Two-view exam shows patchy left perihilar and basilar airspace disease.  No focal consolidation evident within the right lung.  No pulmonary edema. The cardiopericardial silhouette is enlarged.  No evidence for pleural effusion. Imaged bony structures of the thorax are intact.  IMPRESSION: Patchy airspace disease in the left perihilar region and left base suggests pneumonia.  Follow-up imaging is recommended to ensure complete resolution.   Original Report  Authenticated By: Misty Stanley, M.D.   Dg Chest Port 1 View  12/28/2012   *RADIOLOGY REPORT*  Clinical Data: Fever, former smoking history, diabetes  PORTABLE CHEST - 1 VIEW  Comparison: Chest x-ray of 12/28/2012 and 08/12/2012  Findings: The rounded mass-like opacity in the left perihilar region is again noted and may represent pneumonia.  However malignancy cannot be excluded and follow-up chest x-ray or CT of the chest is recommended.  There is cardiomegaly present and there is a question of very mild pulmonary vascular congestion as well. No acute bony abnormality is seen.  IMPRESSION:  1.  Rounded opacity in the left perihilar region may represent pneumonia but a malignancy cannot be excluded.  Recommend follow-up chest x-ray and possible CT of the chest. 2.  Cardiomegaly.  Minimal pulmonary vascular congestion.   Original Report Authenticated By: Ivar Drape, M.D.     Assessment/Plan Principal Problem:   Pneumonia Active Problems:   Hypertension   COPD (chronic obstructive pulmonary disease)   UTI (lower urinary tract infection)   Diabetes mellitus   1. Pneumonia - patient has been started on ceftriaxone and Zithromax. Follow urine culture. UA at  this time does not show any leukocyte esterase or nitrites but did show WBCs. Check CT chest to rule out any mass. 2. Diabetes mellitus type 2 - continue home medications with close followup of CBG. 3. Hypertension - continue home medications. Patient is on lisinopril. Patient's creatinine is slightly elevated probably from chronic kidney disease. 4. Possibly chronic kidney disease -  Patient's last creatinine in the chart shows that it has been moving around 1.4-1.7. Closely follow intake output and metabolic panel. Any worsening of creatinine patient's lisinopril has to be stopped. 5. Mild hypercalcemia -  check SPEP UPEP and vitamin D and parathormone levels. 6. COPD - presently not wheezing.    Code Status: Full code.  Family Communication: Patient's wife at the bedside.  Disposition Plan: Admit to inpatient.    KAKRAKANDY,ARSHAD N. [facility] Pager [phone].  If 7PM-7AM, please contact night-coverage [email or web address] Password [email or web address] 12/29/2012, 1:00 AM

## 2012-12-29 NOTE — Evaluation (Signed)
Physical Therapy Evaluation Patient Details Name: Ian [name] MRN: [id number] DOB: [birth year] Today's Date: 12/29/2012 Time: 1100-1136 PT Time Calculation (min): 36 min  PT Assessment / Plan / Recommendation Clinical Impression  Pt is 77 year old admitted with pneumonia.  Pt lives with wife and was indepdnent prior with no O2 and no assistive devices.  pt was walking daily for exericse.  Pt has fever today and seems somewhat out of it per family.  Pt on 2L O2 and resting pulse ox 95% and pulse 74bpm.  Pt walked 80 feet with RW and ending pulse ox 94% and pulse 74bpm.  pt having difficulty with breathing strategies - hard time breathing through his nose to get the O2.  Pt plans to return home with familiy assist when back to feeling better.  I anticipate he will do much better when his fever breaks and medically stable    PT Assessment  Patient needs continued PT services    Follow Up Recommendations  Home health PT;Supervision/Assistance - 24 hour    Does the patient have the potential to tolerate intense rehabilitation      Barriers to Discharge None      Equipment Recommendations  Rolling walker with 5" wheels (will cont to assess as closer to DC)    Recommendations for Other Services     Frequency Min 3X/week    Precautions / Restrictions Precautions Precautions: Fall Restrictions Weight Bearing Restrictions: No   Pertinent Vitals/Pain No complaints of pain      Mobility  Transfers Transfers: Sit to Stand;Stand to Sit Sit to Stand: 4: Min assist;From bed;With upper extremity assist Stand to Sit: 4: Min guard;With upper extremity assist;To chair/3-in-1 Ambulation/Gait Ambulation/Gait Assistance: 4: Min assist Ambulation Distance (Feet): 80 Feet Ambulation/Gait Assistance Details: Pt usually doesnt need an assistive device.  pt started out walking and was very unsteady - loosing balance anterior.  I got a RW and pt walked with RW - much improved safety. then we were abel  to have pt focus on breathing strategies. pt has very difficutl time coordianting breathing in through his nose and out through his mouth.  Pts beginning O2 sats (on 2L) 95% and pulse 74bpm.  Pt walked 80 feet with 2L and  ending pulse ox 94% and pulse 74bpm.  Pt denies dizziness.   Gait Pattern: Step-to pattern;Shuffle;Trunk flexed;Decreased stride length    Exercises     PT Diagnosis: Difficulty walking;Generalized weakness  PT Problem List: Decreased activity tolerance;Decreased mobility;Cardiopulmonary status limiting activity;Decreased knowledge of use of DME PT Treatment Interventions: DME instruction;Gait training;Therapeutic activities;Functional mobility training;Patient/family education   PT Goals Acute Rehab PT Goals PT Goal Formulation: With patient Potential to Achieve Goals: Good Pt will go Supine/Side to Sit: Independently PT Goal: Supine/Side to Sit - Progress: Goal set today Pt will go Sit to Stand: with supervision;with upper extremity assist PT Goal: Sit to Stand - Progress: Goal set today Pt will Ambulate: >150 feet;with supervision;with rolling walker PT Goal: Ambulate - Progress: Goal set today Pt will Perform Home Exercise Program: with supervision, verbal cues required/provided  Visit Information  Last PT Received On: 12/29/12 Assistance Needed: +1    Subjective Data  Subjective: I want to feel better. Patient Stated Goal: To get back home and be well   Prior Functioning  Home Living Lives With: Spouse Type of Home:  (Pt lives in [place]) Home Access: Level entry Home Layout: One level Prior Function Level of Independence: Independent Driving: Yes Comments: Pt was walking 40  minutes a day for exercise prior to getting sick Communication Communication: No difficulties    Cognition  Cognition Arousal/Alertness: Lethargic Behavior During Therapy: Flat affect Overall Cognitive Status: Impaired/Different from baseline Area of Impairment: Attention     Extremity/Trunk Assessment Right Lower Extremity Assessment RLE ROM/Strength/Tone: Within functional levels Left Lower Extremity Assessment LLE ROM/Strength/Tone: Within functional levels Trunk Assessment Trunk Assessment: Normal   Balance Balance Balance Assessed: No High Level Balance High Level Balance Comments: Pt was not able to pick up his foot to take a step forward without holding on today.  End of Session PT - End of Session Equipment Utilized During Treatment: Gait belt;Oxygen Activity Tolerance: Treatment limited secondary to medical complications (Comment) Patient left: in chair;with call bell/phone within reach;with family/visitor present Nurse Communication: Mobility status  GP     Loyal Buba 12/29/2012, 12:57 PM  Rande Lawman, PT

## 2012-12-30 ENCOUNTER — Inpatient Hospital Stay (HOSPITAL_COMMUNITY): Payer: PRIVATE HEALTH INSURANCE

## 2012-12-30 DIAGNOSIS — I10 Essential (primary) hypertension: Secondary | ICD-10-CM

## 2012-12-30 LAB — URINE CULTURE
Colony Count: NO GROWTH
Culture: NO GROWTH

## 2012-12-30 LAB — GLUCOSE, CAPILLARY
Glucose-Capillary: 89 mg/dL (ref 70–99)
Glucose-Capillary: 93 mg/dL (ref 70–99)
Glucose-Capillary: 90 mg/dL (ref 70–99)

## 2012-12-30 LAB — CBC
HCT: 35.6 % — ABNORMAL LOW (ref 39.0–52.0)
Hemoglobin: 12.2 g/dL — ABNORMAL LOW (ref 13.0–17.0)
WBC: 10.8 10*3/uL — ABNORMAL HIGH (ref 4.0–10.5)

## 2012-12-30 LAB — BASIC METABOLIC PANEL
BUN: 44 mg/dL — ABNORMAL HIGH (ref 6–23)
Chloride: 102 mEq/L (ref 96–112)
GFR calc Af Amer: 29 mL/min — ABNORMAL LOW (ref >90)
Glucose, Bld: 84 mg/dL (ref 70–99)
Potassium: 3.7 mEq/L (ref 3.5–5.1)
Sodium: 137 mEq/L (ref 135–145)

## 2012-12-30 LAB — PARATHYROID HORMONE, INTACT (NO CA): PTH: 98.1 pg/mL — ABNORMAL HIGH (ref 14.0–72.0)

## 2012-12-30 MED ORDER — GLUCOSE 40 % PO GEL
ORAL | Status: AC
  Administered 2012-12-30: 37.5 g via ORAL
  Filled 2012-12-30: qty 1

## 2012-12-30 MED ORDER — SODIUM CHLORIDE 0.9 % IV SOLN: INTRAVENOUS | Status: DC

## 2012-12-30 MED ORDER — GLUCOSE 40 % PO GEL: 1.0000 | ORAL | Status: DC | PRN

## 2012-12-30 MED ORDER — DEXTROSE-NACL 5-0.9 % IV SOLN
INTRAVENOUS | Status: DC
  Administered 2012-12-30: 17:00:00 via INTRAVENOUS

## 2012-12-30 NOTE — Progress Notes (Signed)
[facility] PROGRESS NOTE  [name] [id number] DOB: 1935/09/04 DOA: 12/28/2012 PCP: Hollace Kinnier, DO  Assessment/Plan: Community acquired pneumonia -Continue ceftriaxone and azithromycin -WBC improving -Urine Legionella antigen, urine Streptococcus pneumonia antigen -CT chest shows irregular left upper lobe lingular opacity, likely pneumonia Acute on chronic renal failure -Normal saline -Renal ultrasound -May need nephrology consultation if continues to worsen Diabetes mellitus subcutaneous -Discontinue Amaryl, patient mildly hypoglycemic -Continue NovoLog sliding scale COPD -Clinically stable -Continue Symbicort Hypertension -Continue metoprolol tartrate and hydralazine -Discontinue lisinopril and losartan Hyperlipidemia -Continue fenofibrate Deconditioning -Continue PT  Family Communication:   Pt at beside Disposition Plan:   Home when medically stable    Antibiotics:  Ceftriaxone 12/29/2012>>>    Procedures/Studies: Dg Chest 2 View  12/28/2012   *RADIOLOGY REPORT*  Clinical Data: Fever and cough  CHEST - 2 VIEW  Comparison: 08/12/2012  Findings: Two-view exam shows patchy left perihilar and basilar airspace disease.  No focal consolidation evident within the right lung.  No pulmonary edema. The cardiopericardial silhouette is enlarged.  No evidence for pleural effusion. Imaged bony structures of the thorax are intact.  IMPRESSION: Patchy airspace disease in the left perihilar region and left base suggests pneumonia.  Follow-up imaging is recommended to ensure complete resolution.   Original Report Authenticated By: Misty Stanley, M.D.   Ct Chest Wo Contrast  12/29/2012   *RADIOLOGY REPORT*  Clinical Data: Follow-up abnormal chest x-ray.  CT CHEST WITHOUT CONTRAST  Technique:  Multidetector CT imaging of the chest was performed following the standard protocol without IV contrast.  Comparison: Chest x-ray 12/28/2012  Findings: Irregular airspace opacity noted  in the lingula of the left upper lobe corresponding to the density seen on prior chest x- ray.  I suspect this represents pneumonia, but follow-up after treatment is warranted to exclude malignancy.  The remainder of the lungs are clear.  No pleural effusions.  Mild cardiomegaly. Scattered coronary artery and aortic calcifications.  No evidence of aortic aneurysm. No mediastinal, hilar, or axillary adenopathy. Visualized thyroid and chest wall soft tissues unremarkable.  Imaging into the upper abdomen shows no acute findings.  Small layering gallstones partially imaged within the gallbladder.  On no acute bony abnormality.  IMPRESSION: Irregular airspace density within the lingula corresponding to the abnormality on chest x-ray.  I favor this represents pneumonia, recommend follow up after treatment to assure resolution and exclude malignancy.  Cardiomegaly, coronary artery disease.  Cholelithiasis.   Original Report Authenticated By: [name], M.D.   Dg Chest Port 1 View  12/28/2012   *RADIOLOGY REPORT*  Clinical Data: Fever, former smoking history, diabetes  PORTABLE CHEST - 1 VIEW  Comparison: Chest x-ray of 12/28/2012 and 08/12/2012  Findings: The rounded mass-like opacity in the left perihilar region is again noted and may represent pneumonia.  However malignancy cannot be excluded and follow-up chest x-ray or CT of the chest is recommended.  There is cardiomegaly present and there is a question of very mild pulmonary vascular congestion as well. No acute bony abnormality is seen.  IMPRESSION:  1.  Rounded opacity in the left perihilar region may represent pneumonia but a malignancy cannot be excluded.  Recommend follow-up chest x-ray and possible CT of the chest. 2.  Cardiomegaly.  Minimal pulmonary vascular congestion.   Original Report Authenticated By: Ivar Drape, M.D.         Subjective: Patient denies chest discomfort, shortness of breath, nausea, vomiting, diarrhea, abdominal pain, fevers,  chills, dysuria.  Objective: Filed Vitals:   12/30/12 1000  12/30/12 1012 12/30/12 1130 12/30/12 1424  BP:    107/45  Pulse:    65  Temp:    98.7 F (37.1 C)  TempSrc:    Oral  Resp: 18   20  Height:      Weight:      SpO2: 89% 96% 95% 95%    Intake/Output Summary (Last 24 hours) at 12/30/12 1528 Last data filed at 12/30/12 1423  Gross per 24 hour  Intake   2245 ml  Output    775 ml  Net   1470 ml   Weight change: 1.35 kg (2 lb 15.6 oz) Exam:   General:  Pt is alert, follows commands appropriately, not in acute distress  HEENT: No icterus, No thrush, [place]/AT  Cardiovascular: RRR, S1/S2, no rubs, no gallops  Respiratory:  Left Basilar crackles. Right CTA.  No wheezes rhonchi. Good air movement.  Abdomen: Soft/+BS, non tender, non distended, no guarding  Extremities: 1+LE edema, No lymphangitis, No petechiae, No rashes, no synovitis  Data Reviewed: Basic Metabolic Panel:  Recent Labs Lab 12/28/12 1747 12/29/12 0448 12/30/12 0558  NA 139 138 137  K 4.4 3.9 3.7  CL 99 99 102  CO2 29 28 27   GLUCOSE 175* 175* 84  BUN 24* 28* 44*  CREATININE 1.82* 1.83* 2.41*  CALCIUM 11.1* 10.0 9.4   Liver Function Tests:  Recent Labs Lab 12/28/12 1747  AST 14  ALT 14  ALKPHOS 56  BILITOT 0.7  PROT 8.6*  ALBUMIN 4.0   No results found for this basename: LIPASE, AMYLASE,  in the last 168 hours No results found for this basename: AMMONIA,  in the last 168 hours CBC:  Recent Labs Lab 12/28/12 1747 12/29/12 0448 12/30/12 0558  WBC 15.0* 14.8* 10.8*  HGB 15.9 14.5 12.2*  HCT 45.2 41.3 35.6*  MCV 92.1 92.6 92.7  PLT 160 134* 132*   Cardiac Enzymes: No results found for this basename: CKTOTAL, CKMB, CKMBINDEX, TROPONINI,  in the last 168 hours BNP: No components found with this basename: POCBNP,  CBG:  Recent Labs Lab 12/29/12 1641 12/29/12 2055 12/30/12 0609 12/30/12 1108 12/30/12 1140  GLUCAP 79 114* 89 58* 89    Recent Results (from the past 240  hour(s))  URINE CULTURE     Status: None   Collection Time    12/28/12  6:52 PM      Result Value Range Status   Specimen Description URINE, CLEAN CATCH   Final   Special Requests NONE   Final   Culture  Setup Time 12/28/2012 19:56   Final   Colony Count NO GROWTH   Final   Culture NO GROWTH   Final   Report Status 12/29/2012 FINAL   Final  CULTURE, BLOOD (ROUTINE X 2)     Status: None   Collection Time    12/29/12 10:00 AM      Result Value Range Status   Specimen Description BLOOD LEFT ANTECUBITAL   Final   Special Requests BOTTLES DRAWN AEROBIC AND ANAEROBIC 10CC   Final   Culture  Setup Time 12/29/2012 16:46   Final   Culture     Final   Value:        BLOOD CULTURE RECEIVED NO GROWTH TO DATE CULTURE WILL BE HELD FOR 5 DAYS BEFORE ISSUING A FINAL NEGATIVE REPORT   Report Status PENDING   Incomplete  CULTURE, BLOOD (ROUTINE X 2)     Status: None   Collection Time  12/29/12 10:10 AM      Result Value Range Status   Specimen Description BLOOD RIGHT ANTECUBITAL   Final   Special Requests     Final   Value: BOTTLES DRAWN AEROBIC AND ANAEROBIC [place] AER 5CC ANA   Culture  Setup Time 12/29/2012 16:45   Final   Culture     Final   Value:        BLOOD CULTURE RECEIVED NO GROWTH TO DATE CULTURE WILL BE HELD FOR 5 DAYS BEFORE ISSUING A FINAL NEGATIVE REPORT   Report Status PENDING   Incomplete  URINE CULTURE     Status: None   Collection Time    12/29/12  4:50 PM      Result Value Range Status   Specimen Description URINE, RANDOM   Final   Special Requests NONE   Final   Culture  Setup Time 12/29/2012 17:24   Final   Colony Count NO GROWTH   Final   Culture NO GROWTH   Final   Report Status 12/30/2012 FINAL   Final     Scheduled Meds: . amLODipine  10 mg Oral Daily  . azithromycin  500 mg Intravenous Q24H  . budesonide-formoterol  2 puff Inhalation BID  . cefTRIAXone (ROCEPHIN)  IV  1 g Intravenous Q24H  . enoxaparin (LOVENOX) injection  40 mg Subcutaneous Q24H  .  fenofibrate  54 mg Oral Daily  . fluticasone  1 spray Each Nare Daily  . hydrALAZINE  25 mg Oral Q8H  . insulin aspart  0-9 Units Subcutaneous TID WC  . metoprolol tartrate  25 mg Oral BID  . montelukast  10 mg Oral QHS  . PARoxetine  20 mg Oral BH-q7a  . sodium chloride  3 mL Intravenous Q12H  . sodium chloride  3 mL Intravenous Q12H  . traZODone  150 mg Oral QHS   Continuous Infusions:    Donavyn Fecher, DO  [facility] Pager [phone]  If 7PM-7AM, please contact night-coverage [email or web address] Password [facility] 12/30/2012, 3:28 PM   LOS: 2 days

## 2012-12-30 NOTE — Evaluation (Signed)
Occupational Therapy Evaluation Patient Details Name: Ian [name] MRN: [id number] DOB: 1935/09/01 Today's Date: 12/30/2012 Time: 0900-0920 OT Time Calculation (min): 20 min  OT Assessment / Plan / Recommendation Clinical Impression  Pt does demonstrate overall impaired activity tolerance requiring rest breaks with all ADL tasks. Pt also currently requires supplemental O2 to maintain stats above 90%. Pt could benefit from continued skilled OT services to further improve his activity tolerance with ADLs.    OT Assessment  Patient needs continued OT Services    Follow Up Recommendations  No OT follow up    Barriers to Discharge      Equipment Recommendations     none  Recommendations for Other Services    Frequency  Min 2X/week    Precautions / Restrictions Precautions Precautions: Fall Restrictions Weight Bearing Restrictions: No   Pertinent Vitals/Pain No c/o pain    ADL  Grooming: Performed;Wash/dry hands Where Assessed - Grooming: Unsupported standing Lower Body Dressing: Performed;Min guard Where Assessed - Lower Body Dressing: Unsupported standing Toilet Transfer: Performed;Min guard Toilet Transfer Method: Squat pivot Toileting - Clothing Manipulation and Hygiene: Min guard;Performed Where Assessed - Toileting Clothing Manipulation and Hygiene: Standing Transfers/Ambulation Related to ADLs: Pt performed functional ambulation around room with RW with min guard with A to help keep up with O2 tubing and IV line. ADL Comments: Pt able to don underwear and sock sitting EOB with min guard for sit to stand; however also becomes winded and requires rest breaks.    OT Diagnosis: Generalized weakness  OT Problem List: Decreased strength;Decreased activity tolerance;Cardiopulmonary status limiting activity OT Treatment Interventions: Self-care/ADL training;Therapeutic exercise;Energy conservation;Therapeutic activities   OT Goals(Current goals can be found in the care plan  section) Acute Rehab OT Goals Patient Stated Goal: To get back home and be well OT Goal Formulation: With patient Time For Goal Achievement: 01/06/13 Potential to Achieve Goals: Good  Visit Information  Last OT Received On: 12/30/12 Assistance Needed: +1 History of Present Illness: 77 yr old male admitted to hospital for fever and PNA. Pt requiring supplimental O2 at this time to maintain sats above 90%       Prior [place] expects to be discharged to:: Private residence Living Arrangements: Spouse/significant other Type of Home:  (condo) Home Access: Level entry Home Layout: One level Prior Function Level of Independence: Independent         Vision/Perception Vision - History Baseline Vision: Wears glasses all the time Vision - Assessment Eye Alignment: Within Functional Limits Perception Perception: Within Functional Limits Praxis Praxis: Intact   Cognition  Cognition Arousal/Alertness: Awake/alert Behavior During Therapy: WFL for tasks assessed/performed Overall Cognitive Status: Within Functional Limits for tasks assessed General Comments: extremely [facility]    Extremity/Trunk Assessment Upper Extremity Assessment Upper Extremity Assessment: Overall WFL for tasks assessed Lower Extremity Assessment Lower Extremity Assessment: Overall WFL for tasks assessed Cervical / Trunk Assessment Cervical / Trunk Assessment: Normal     Mobility Bed Mobility Bed Mobility: Supine to Sit Supine to Sit: 4: Min assist Details for Bed Mobility Assistance: REquired A to lift trunk up from sidelying as well as rail. Transfers Transfers: Sit to Stand;Stand to Sit Sit to Stand: 4: Min assist;From bed;With upper extremity assist Stand to Sit: 4: Min guard;With upper extremity assist;To chair/3-in-1     Exercise     Balance     End of Session OT - End of Session Activity Tolerance: Patient tolerated treatment well Patient left: in  chair;with call  bell/phone within reach  GO     Willeen Cass [facility] 12/30/2012, 1:13 PM

## 2012-12-30 NOTE — Progress Notes (Signed)
Physical Therapy Treatment Patient Details Name: Ian [name] MRN: [id number] DOB: [birth year] Today's Date: 12/30/2012 Time: [id number] PT Time Calculation (min): 17 min  PT Assessment / Plan / Recommendation  PT Comments   77 y.o. male admitted to [facility] for fever and PNA.  He is currently requiring supplemental O2 to maintain sats >90%.  He continues to mobilize with RW with up to mod assist for LOB during gait.  His wife is currently receiving chemo treatments and does not have the strength or endurance to care for him at home.  PT recommending SNF for rehab.    Follow Up Recommendations  SNF     Does the patient have the potential to tolerate intense rehabilitation    NA  Barriers to Discharge  none      Equipment Recommendations  Rolling walker with 5" wheels    Recommendations for Other Services   none  Frequency Min 2X/week   Progress towards PT Goals Progress towards PT goals: Progressing toward goals  Plan Discharge plan needs to be updated;Frequency needs to be updated    Precautions / Restrictions Precautions Precautions: Fall Precaution Comments: monitor O2 sats Restrictions Weight Bearing Restrictions: No   Pertinent Vitals/Pain O2 sats during gait 90% on 2 L O2 [place].  No obvious DOE.     Mobility  Bed Mobility Bed Mobility: Supine to Sit Supine to Sit: 4: Min assist Sit to Supine: 4: Min assist;With rail;HOB flat Details for Bed Mobility Assistance: min assist to help manage legs into bed Transfers Transfers: Sit to Stand;Stand to Sit Sit to Stand: 4: Min assist;With armrests;From chair/3-in-1 Stand to Sit: 4: Min assist;With upper extremity assist;To bed Details for Transfer Assistance:  min assist to boost trunk up over weak legs, min assist to help control descent to sit.   Ambulation/Gait Ambulation/Gait Assistance: 3: Mod assist Ambulation Distance (Feet): 80 Feet Assistive device: Rolling walker Ambulation/Gait Assistance Details: up to mod assist for LOB  while turning and walking.  Pt having difficult time keeping all4 points of RW in contact with the floor.  Verbal cues to slow gait speed for safety.  Staggering gait pattern.   Gait Pattern: Step-through pattern;Trunk flexed;Shuffle (staggering) Gait velocity: too fast for safety General Gait Details: pt ambulated on 2 L O2 [place].  No outward signs of respiratory distress.  O2 sats 90% on 2 LO2 with gait.        PT Goals (current goals can now be found in the care plan section) Acute Rehab PT Goals Patient Stated Goal: To get back home and be well  Visit Information  Last PT Received On: 12/30/12 Assistance Needed: +1 History of Present Illness: 77 y.o. male admitted to [facility] for fever and PNA.  He is currently requiring supplemental O2 to maintain sats >90%.      Subjective Data  Subjective: "What!?!"  Pt is EXTREMELY HOH.  Seems better in his left ear compared to his right.  Has hearing aids in, but they do not help.   Patient Stated Goal: To get back home and be well   Cognition  Cognition Arousal/Alertness: Awake/alert Behavior During Therapy: WFL for tasks assessed/performed Overall Cognitive Status: Within Functional Limits for tasks assessed General Comments: extremely HOH       End of Session PT - End of Session Equipment Utilized During Treatment: Gait belt;Oxygen Activity Tolerance: Patient limited by fatigue Patient left: in bed;with call bell/phone within reach;with family/visitor present (son and wife) Nurse Communication: Mobility status  Barbarann Ehlers [place], [place], DPT [phone]   12/30/2012, 4:25 PM

## 2012-12-30 NOTE — Progress Notes (Signed)
CBG  Low  59  At 1200  CHO given  repet after 15 mins 89  As  Per hypoglycemiia protocol . Dr Tat with orders . Amyryl d'cd

## 2012-12-30 NOTE — Care Management Note (Signed)
    Page 1 of 2   12/30/2012     1:15:25 PM   CARE MANAGEMENT NOTE 12/30/2012  Patient:  Ian [name], Ian [name]   Account Number:  [id number]  Date Initiated:  12/30/2012  Documentation initiated by:  Fuller Mandril  Subjective/Objective Assessment:   77 y.o. male history of diabetes mellitus type 2, hypertension, COPD, hyperlipidemia workup with a cough yesterday.     Action/Plan:   IV ANTIBX/ HHPT/OT   Anticipated DC Date:  01/01/2013   Anticipated DC Plan:  [place]  CM consult      [facility] Choice  HOME HEALTH   Choice offered to / List presented to:  C-3 Spouse        HH arranged  HH-2 PT  HH-3 OT      [place].   Status of service:  Completed, signed off Medicare Important Message given?   (If response is "NO", the following Medicare IM given date fields will be blank) Date Medicare IM given:   Date Additional Medicare IM given:    Discharge Disposition:    Per UR Regulation:    If discussed at Long Length of Stay Meetings, dates discussed:    Comments:  12/30/12 @ 1100.Marland KitchenMarland KitchenFuller Mandril, RN, BSN, [place] [phone] Spoke with pt and wife at bedside concerning discharge planning for PT/OT services.  Offerd pt and spouse list of home health agencies.  Pt chose [facility] to render services.  Janae Sauce of [facility] notified.  No DME needs identified at this time.

## 2012-12-31 ENCOUNTER — Inpatient Hospital Stay (HOSPITAL_COMMUNITY): Payer: PRIVATE HEALTH INSURANCE

## 2012-12-31 DIAGNOSIS — E785 Hyperlipidemia, unspecified: Secondary | ICD-10-CM

## 2012-12-31 LAB — UIFE/LIGHT CHAINS/TP QN, 24-HR UR
Alpha 1, Urine: DETECTED — AB
Free Kappa Lt Chains,Ur: 33.8 mg/dL — ABNORMAL HIGH (ref 0.14–2.42)
Free Kappa/Lambda Ratio: 3.25 ratio (ref 2.04–10.37)
Free Lambda Lt Chains,Ur: 10.4 mg/dL — ABNORMAL HIGH (ref 0.02–0.67)
Total Protein, Urine: 474.4 mg/dL

## 2012-12-31 LAB — GLUCOSE, CAPILLARY
Glucose-Capillary: 111 mg/dL — ABNORMAL HIGH (ref 70–99)
Glucose-Capillary: 135 mg/dL — ABNORMAL HIGH (ref 70–99)
Glucose-Capillary: 132 mg/dL — ABNORMAL HIGH (ref 70–99)

## 2012-12-31 LAB — CBC
MCV: 93.1 fL (ref 78.0–100.0)
Platelets: 133 10*3/uL — ABNORMAL LOW (ref 150–400)
RBC: 3.91 MIL/uL — ABNORMAL LOW (ref 4.22–5.81)
RDW: 14.3 % (ref 11.5–15.5)
WBC: 7.7 10*3/uL (ref 4.0–10.5)

## 2012-12-31 LAB — BASIC METABOLIC PANEL
CO2: 27 mEq/L (ref 19–32)
Calcium: 9.6 mg/dL (ref 8.4–10.5)
Creatinine, Ser: 2.57 mg/dL — ABNORMAL HIGH (ref 0.50–1.35)
GFR calc Af Amer: 27 mL/min — ABNORMAL LOW (ref >90)
Sodium: 138 mEq/L (ref 135–145)

## 2012-12-31 LAB — PROTEIN ELECTROPHORESIS, SERUM
Albumin ELP: 47.9 % — ABNORMAL LOW (ref 55.8–66.1)
Alpha-2-Globulin: 13 % — ABNORMAL HIGH (ref 7.1–11.8)
Beta Globulin: 6.9 % (ref 4.7–7.2)
Total Protein ELP: 6.8 g/dL (ref 6.0–8.3)

## 2012-12-31 MED ORDER — ENOXAPARIN SODIUM 30 MG/0.3ML ~~LOC~~ SOLN
30.0000 mg | SUBCUTANEOUS | Status: DC
  Administered 2013-01-01 – 2013-01-02 (×2): 30 mg via SUBCUTANEOUS
  Filled 2012-12-31 (×2): qty 0.3

## 2012-12-31 NOTE — Consult Note (Signed)
[name] Renal Consultation Note  Requesting MD: Tat Indication for Consultation: Acute on CKD, wanting to discharge  HPI:  Ian [name] is a 77 y.o. male with past medical history significant for type 2 diabetes, this with retinopathy, hypertension and hyperlipidemia. He also has stage III CKD. followed by Dr. Graylon Gunning at [facility]. His last creatinine was noted to be 1.7 when he saw Dr. Posey Pronto in February of this year. Patient was brought into the emergency department with complaints of cough, fever and confusion with difficulty walking. Evaluation found likely pneumonia and he was placed on antibiotics. There were also white cells in his urine but urine culture was negative. Presenting creatinine was 1.8 which is right at his baseline. Of note, when he saw Dr. Posey Pronto in February he was not on an ACE or an ARB. Upon presentation to the hospital he was on both losartan and lisinopril. He's had some soft blood pressures and so both medicines were actually discontinued on 6/24 but I believe he was getting them prior to that. He was noted to have a pretty low blood pressure of systolic [id number] on the [id number]. Then on the 25th creatinine went up to 2.41 and day 2.57. His urine has been recorded and is just under a liter but patient also reports some incontinence. He's had a renal ultrasound which did not show any hydronephrosis. His most recent urinalysis showed less white blood cells but granular casts indicative of possible ATN. Patient feels much better from when he was admitted. Plan actually been to transfer him to a skilled nursing facility but that has been on hold secondary to his kidney function. Patient also has had an SPEP done which does not show any monoclonal protein.  Creatinine, Ser  Date/Time Value Range Status  12/31/2012  4:30 AM 2.57* 0.50 - 1.35 mg/dL Final  12/30/2012  5:58 AM 2.41* 0.50 - 1.35 mg/dL Final  12/29/2012  4:48 AM 1.83* 0.50 - 1.35 mg/dL Final   12/28/2012  5:47 PM 1.82* 0.50 - 1.35 mg/dL Final  10/16/2009  6:50 AM 1.22  0.4 - 1.5 mg/dL Final  10/15/2009  5:41 AM 1.30  0.4 - 1.5 mg/dL Final  10/14/2009  5:20 AM 1.75* 0.4 - 1.5 mg/dL Final  10/13/2009  5:45 AM 2.39* 0.4 - 1.5 mg/dL Final  10/12/2009  6:10 AM 2.18* 0.4 - 1.5 mg/dL Final  10/11/2009  6:40 AM 1.74* 0.4 - 1.5 mg/dL Final  10/08/2009  6:15 AM 1.45  0.4 - 1.5 mg/dL Final  10/07/2009  5:25 AM 1.53* 0.4 - 1.5 mg/dL Final  10/06/2009  4:14 AM 1.62* 0.4 - 1.5 mg/dL Final  10/05/2009  3:28 AM 1.80* 0.4 - 1.5 mg/dL Final  09/28/2009 10:32 AM 1.48  0.4 - 1.5 mg/dL Final     PMHx:   Past Medical History  Diagnosis Date  . Diabetes mellitus   . Hypertension   . COPD (chronic obstructive pulmonary disease)   . Kidney disease     mild  . Hyperlipidemia   . Hypertrophy of prostate without urinary obstruction and other lower urinary tract symptoms (LUTS)   . Hypercalcemia   . Edema   . Ventral hernia, unspecified, without mention of obstruction or gangrene   . Anxiety   . Depression   . Allergy   . Unspecified sleep apnea   . Hypertrophy of prostate without urinary obstruction and other lower urinary tract symptoms (LUTS)   . Anemia, unspecified   . Unspecified disorder of kidney  and ureter   . Alzheimer's disease   . Chronic maxillary sinusitis     Past Surgical History  Procedure Laterality Date  . Hernia repair    . Eyplosatory lap  2002    Family Hx:  Family History  Problem Relation Age of Onset  . Pancreatic cancer Sister   . Heart disease Father   . Hypertension Brother     Social History:  reports that he quit smoking about 24 years ago. He does not have any smokeless tobacco history on file. He reports that he does not drink alcohol or use illicit drugs.  Allergies: No Known Allergies  Medications: Prior to Admission medications   Medication Sig Start Date End Date Taking? Authorizing Provider  albuterol (PROVENTIL HFA;VENTOLIN HFA) 108 (90 BASE) MCG/ACT  inhaler Inhale 2 puffs into the lungs every 6 (six) hours as needed for wheezing or shortness of breath. 11/26/12  Yes [name] L [name], DO  amLODipine (NORVASC) 10 MG tablet Take 10 mg by mouth daily.   Yes Historical Provider, MD  budesonide-formoterol (SYMBICORT) 80-4.5 MCG/ACT inhaler Inhale 2 puffs into the lungs 2 (two) times daily.   Yes Historical Provider, MD  Fenofibric Acid 35 MG TABS Take 35 mg by mouth every evening.    Yes Historical Provider, MD  glimepiride (AMARYL) 4 MG tablet Take 4 mg by mouth daily before breakfast.   Yes Historical Provider, MD  lisinopril (PRINIVIL,ZESTRIL) 5 MG tablet Take 1 tablet (5 mg total) by mouth daily. 11/26/12  Yes [name] L [name], DO  loratadine (CLARITIN) 10 MG tablet Take 10 mg by mouth daily as needed for allergies.    Yes Historical Provider, MD  losartan (COZAAR) 25 MG tablet Take 1 tablet (25 mg total) by mouth daily. 11/26/12  Yes [name] L [name], DO  metoprolol tartrate (LOPRESSOR) 25 MG tablet Take 25 mg by mouth 2 (two) times daily.   Yes Historical Provider, MD  mometasone (NASONEX) 50 MCG/ACT nasal spray Place 2 sprays into the nose daily. 11/26/12 11/26/13 Yes [name] L [name], DO  montelukast (SINGULAIR) 10 MG tablet Take 1 tablet (10 mg total) by mouth at bedtime. 11/26/12  Yes [name] L [name], DO  PARoxetine (PAXIL) 20 MG tablet Take 20 mg by mouth every morning.   Yes Historical Provider, MD  traZODone (DESYREL) 150 MG tablet Take 150 mg by mouth at bedtime.   Yes Historical Provider, MD  Blood Glucose Monitoring Suppl (ONE TOUCH ULTRA SYSTEM KIT) W/DEVICE KIT 1 kit by Does not apply route once. Use as Directed. DX: 250.02 12/22/12   Pricilla Larsson, NP  glucose blood (BAYER CONTOUR TEST) test strip Use as directed to check blood sugar to control diabetes. 250.00 11/26/12   [name] L [name], DO  glucose blood test strip Use as instructed once daily to help control blood sugar. 12/23/12   Blanchie Serve, MD    I have reviewed the patient's current  medications.  Labs:  Results for orders placed during the hospital encounter of 12/28/12 (from the past 48 hour(s))  GLUCOSE, CAPILLARY     Status: None   Collection Time    12/29/12  4:41 PM      Result Value Range   Glucose-Capillary 79  70 - 99 mg/dL   Comment 1 Notify RN    URINALYSIS, ROUTINE W REFLEX MICROSCOPIC     Status: Abnormal   Collection Time    12/29/12  4:50 PM      Result Value Range   Color,  Urine YELLOW  YELLOW   APPearance CLEAR  CLEAR   Specific Gravity, Urine 1.025  1.005 - 1.030   pH 5.5  5.0 - 8.0   Glucose, UA NEGATIVE  NEGATIVE mg/dL   Hgb urine dipstick MODERATE (*) NEGATIVE   Bilirubin Urine SMALL (*) NEGATIVE   Ketones, ur NEGATIVE  NEGATIVE mg/dL   Protein, ur >300 (*) NEGATIVE mg/dL   Urobilinogen, UA 1.0  0.0 - 1.0 mg/dL   Nitrite NEGATIVE  NEGATIVE   Leukocytes, UA NEGATIVE  NEGATIVE  URINE CULTURE     Status: None   Collection Time    12/29/12  4:50 PM      Result Value Range   Specimen Description URINE, RANDOM     Special Requests NONE     Culture  Setup Time 12/29/2012 17:24     Colony Count NO GROWTH     Culture NO GROWTH     Report Status 12/30/2012 FINAL    SODIUM, URINE, RANDOM     Status: None   Collection Time    12/29/12  4:50 PM      Result Value Range   Sodium, Ur 25    OSMOLALITY, URINE     Status: None   Collection Time    12/29/12  4:50 PM      Result Value Range   Osmolality, Ur 555  390 - 1090 mOsm/kg  URINE MICROSCOPIC-ADD ON     Status: Abnormal   Collection Time    12/29/12  4:50 PM      Result Value Range   Squamous Epithelial / LPF FEW (*) RARE   WBC, UA 3-6  <3 WBC/hpf   RBC / HPF 3-6  <3 RBC/hpf   Bacteria, UA FEW (*) RARE   Casts GRANULAR CAST (*) NEGATIVE   Comment: HYALINE CASTS  GLUCOSE, CAPILLARY     Status: Abnormal   Collection Time    12/29/12  8:55 PM      Result Value Range   Glucose-Capillary 114 (*) 70 - 99 mg/dL   Comment 1 Notify RN    CBC     Status: Abnormal   Collection Time     12/30/12  5:58 AM      Result Value Range   WBC 10.8 (*) 4.0 - 10.5 K/uL   RBC 3.84 (*) 4.22 - 5.81 MIL/uL   Hemoglobin 12.2 (*) 13.0 - 17.0 g/dL   HCT 35.6 (*) 39.0 - 52.0 %   MCV 92.7  78.0 - 100.0 fL   MCH 31.8  26.0 - 34.0 pg   MCHC 34.3  30.0 - 36.0 g/dL   RDW 14.0  11.5 - 15.5 %   Platelets 132 (*) 150 - 400 K/uL  BASIC METABOLIC PANEL     Status: Abnormal   Collection Time    12/30/12  5:58 AM      Result Value Range   Sodium 137  135 - 145 mEq/L   Potassium 3.7  3.5 - 5.1 mEq/L   Chloride 102  96 - 112 mEq/L   CO2 27  19 - 32 mEq/L   Glucose, Bld 84  70 - 99 mg/dL   BUN 44 (*) 6 - 23 mg/dL   Creatinine, Ser 2.41 (*) 0.50 - 1.35 mg/dL   Calcium 9.4  8.4 - 10.5 mg/dL   GFR calc non Af Amer 25 (*) >90 mL/min   GFR calc Af Amer 29 (*) >90 mL/min   Comment:  The eGFR has been calculated     using the CKD EPI equation.     This calculation has not been     validated in all clinical     situations.     eGFR's persistently     <90 mL/min signify     possible Chronic Kidney Disease.  GLUCOSE, CAPILLARY     Status: None   Collection Time    12/30/12  6:09 AM      Result Value Range   Glucose-Capillary 89  70 - 99 mg/dL  GLUCOSE, CAPILLARY     Status: Abnormal   Collection Time    12/30/12 11:08 AM      Result Value Range   Glucose-Capillary 58 (*) 70 - 99 mg/dL   Comment 1 Notify RN    GLUCOSE, CAPILLARY     Status: None   Collection Time    12/30/12 11:40 AM      Result Value Range   Glucose-Capillary 89  70 - 99 mg/dL  GLUCOSE, CAPILLARY     Status: Abnormal   Collection Time    12/30/12  4:11 PM      Result Value Range   Glucose-Capillary 48 (*) 70 - 99 mg/dL   Comment 1 Notify RN    GLUCOSE, CAPILLARY     Status: None   Collection Time    12/30/12  4:40 PM      Result Value Range   Glucose-Capillary 93  70 - 99 mg/dL  GLUCOSE, CAPILLARY     Status: None   Collection Time    12/30/12  9:11 PM      Result Value Range   Glucose-Capillary 90   70 - 99 mg/dL   Comment 1 Notify RN    BASIC METABOLIC PANEL     Status: Abnormal   Collection Time    12/31/12  4:30 AM      Result Value Range   Sodium 138  135 - 145 mEq/L   Potassium 4.1  3.5 - 5.1 mEq/L   Chloride 104  96 - 112 mEq/L   CO2 27  19 - 32 mEq/L   Glucose, Bld 103 (*) 70 - 99 mg/dL   BUN 53 (*) 6 - 23 mg/dL   Creatinine, Ser 2.57 (*) 0.50 - 1.35 mg/dL   Calcium 9.6  8.4 - 10.5 mg/dL   GFR calc non Af Amer 23 (*) >90 mL/min   GFR calc Af Amer 27 (*) >90 mL/min   Comment:            The eGFR has been calculated     using the CKD EPI equation.     This calculation has not been     validated in all clinical     situations.     eGFR's persistently     <90 mL/min signify     possible Chronic Kidney Disease.  CBC     Status: Abnormal   Collection Time    12/31/12  4:30 AM      Result Value Range   WBC 7.7  4.0 - 10.5 K/uL   RBC 3.91 (*) 4.22 - 5.81 MIL/uL   Hemoglobin 12.3 (*) 13.0 - 17.0 g/dL   HCT 36.4 (*) 39.0 - 52.0 %   MCV 93.1  78.0 - 100.0 fL   MCH 31.5  26.0 - 34.0 pg   MCHC 33.8  30.0 - 36.0 g/dL   RDW 14.3  11.5 - 15.5 %   Platelets 133 (*)  150 - 400 K/uL  GLUCOSE, CAPILLARY     Status: Abnormal   Collection Time    12/31/12  6:43 AM      Result Value Range   Glucose-Capillary 111 (*) 70 - 99 mg/dL  GLUCOSE, CAPILLARY     Status: Abnormal   Collection Time    12/31/12 10:57 AM      Result Value Range   Glucose-Capillary 164 (*) 70 - 99 mg/dL   Comment 1 Notify RN       ROS:  A comprehensive review of systems was negative.  he actually states he feels back to normal. He's having only slight shortness of breath. This is improved from admission. He's had no fevers. He has no urinary symptoms. He has no lower extremity edema.  Physical Exam: Filed Vitals:   12/31/12 0914  BP: 115/44  Pulse: 74  Temp:   Resp:      General: Alert white male who is sitting in a bedside chair in no acute distress. He is joking with his family. HEENT: Pupils  equal round reactive to light symmetrical motions are intact, mucous members are moist without lesion. Neck: There is no jugular venous distention, no carotid bruits and no lymphadenopathy. Heart: Regular rate and rhythm without murmur, gallop, or rub. Lungs: Poor effort bilaterally. Patient has a long smoking history. There are some crackles. Abdomen: Obese, soft, nontender Extremities: There is no clubbing, cyanosis, or edema. Skin: Warm and dry Neuro: Patient is alert. The remainder of the neurologic exam is nonfocal.  Assessment/Plan: 77 year old white male with baseline stage III CK D. secondary to diabetic nephropathy. He now has acute on chronic renal failure in the setting of hospitalization for pneumonia and a lowish blood pressure on an ACE and ARB. 1.Renal- acute on chronic renal failure. The patient is nonoliguric at this time and is not uremic. I suspect the change in renal function is due to a combination of systemic illness and also having a low initial blood pressure on both an ACE and ARB. Urinalysis is not indicative of glomerulonephritis. Ultrasound is not indicative of obstructive uropathy. Granular casts in the urine is consistent with ATN from above. His creatinine appears to be trying to plateau. I don't have any further particular suggestions today. I anticipate that kidney function will improve with time. I understand that discharge is a priority. I would say if renal function remains stable from today to tomorrow I would feel comfortable having him go to a nursing facility if his  labs could be checked in a few days to make sure heading in the right direction. 2. Hypertension/volume  - would keep off of ACE and ARB for right now. Blood pressure remains controlled. Would want kidney function back to baseline before those medications were reintroduced. 3. Anemia  - doesn't appear to be an issue at this time.  Thank you very much for this consultation. As above no new suggestions  today. We'll followup in a.m.   Gradyn Shein A 12/31/2012, 1:16 PM

## 2012-12-31 NOTE — Plan of Care (Signed)
Problem: Phase I Progression Outcomes Goal: Dyspnea controlled at rest Outcome: Completed/Met Date Met:  12/31/12 Pt has not had any c/o SOB while at rest Goal: Pain controlled with appropriate interventions Outcome: Completed/Met Date Met:  12/31/12 Pt has not had any c/o pain    Goal: OOB as tolerated unless otherwise ordered Outcome: Completed/Met Date Met:  12/31/12 Pt oob with walker and assist x 1 tolerates well

## 2012-12-31 NOTE — Progress Notes (Signed)
[facility] PROGRESS NOTE  Ian [name] [id number] DOB: Oct 30, 1935 DOA: 12/28/2012 PCP: Hollace Kinnier, DO  Assessment/Plan: Community acquired pneumonia  -Continue ceftriaxone and azithromycin  -WBC improving  -Urine Legionella antigen, urine Streptococcus pneumonia antigen  -CT chest shows irregular left upper lobe lingular opacity, likely pneumonia  Acute on chronic renal failure  -suspect component of vol depletion with losartan and lisinopril -D5NS--1 liter given 6/25-->creatinine appears to be plateauing but still on rise -Renal ultrasound--no hydronephrosis  -consult nephrology Diabetes mellitus with proteinuria  -Discontinue Amaryl, patient mildly hypoglycemic-->resolved -Continue NovoLog sliding scale  COPD  -Clinically stable  -Continue Symbicort  Hypertension  -Continue metoprolol tartrate and hydralazine  -Discontinue lisinopril and losartan  Hyperlipidemia  -Continue fenofibrate  Deconditioning  -Continue PT   Family Communication:   Wife and son at beside Disposition Plan:   SNF when medically stable   Antibiotics:  Ceftriaxone 12/28/2012>>>  Azithromycin 12/28/2012>>>       Procedures/Studies: Dg Chest 2 View  12/28/2012   *RADIOLOGY REPORT*  Clinical Data: Fever and cough  CHEST - 2 VIEW  Comparison: 08/12/2012  Findings: Two-view exam shows patchy left perihilar and basilar airspace disease.  No focal consolidation evident within the right lung.  No pulmonary edema. The cardiopericardial silhouette is enlarged.  No evidence for pleural effusion. Imaged bony structures of the thorax are intact.  IMPRESSION: Patchy airspace disease in the left perihilar region and left base suggests pneumonia.  Follow-up imaging is recommended to ensure complete resolution.   Original Report Authenticated By: Misty Stanley, M.D.   Ct Chest Wo Contrast  12/29/2012   *RADIOLOGY REPORT*  Clinical Data: Follow-up abnormal chest x-ray.  CT CHEST WITHOUT CONTRAST   Technique:  Multidetector CT imaging of the chest was performed following the standard protocol without IV contrast.  Comparison: Chest x-ray 12/28/2012  Findings: Irregular airspace opacity noted in the lingula of the left upper lobe corresponding to the density seen on prior chest x- ray.  I suspect this represents pneumonia, but follow-up after treatment is warranted to exclude malignancy.  The remainder of the lungs are clear.  No pleural effusions.  Mild cardiomegaly. Scattered coronary artery and aortic calcifications.  No evidence of aortic aneurysm. No mediastinal, hilar, or axillary adenopathy. Visualized thyroid and chest wall soft tissues unremarkable.  Imaging into the upper abdomen shows no acute findings.  Small layering gallstones partially imaged within the gallbladder.  On no acute bony abnormality.  IMPRESSION: Irregular airspace density within the lingula corresponding to the abnormality on chest x-ray.  I favor this represents pneumonia, recommend follow up after treatment to assure resolution and exclude malignancy.  Cardiomegaly, coronary artery disease.  Cholelithiasis.   Original Report Authenticated By: [name], M.D.   US Renal  12/30/2012   *RADIOLOGY REPORT*  Clinical Data: 77 year old male with acute renal failure.  RENAL/URINARY TRACT ULTRASOUND COMPLETE  Comparison:  10/13/2009.  Findings:  Right Kidney:  No hydronephrosis.  Renal length 11.4 cm.  Cortical echotexture stable within normal limits.  Unchanged small upper pole simple appearing cyst.  Left Kidney:  No hydronephrosis.  Renal length 10.7 cm.  Cortical echotexture within normal limits.  Bladder:  Unremarkable.  IMPRESSION: Stable and negative sonographic appearance of the kidneys and bladder.   Original Report Authenticated By: Roselyn Reef, M.D.   Dg Chest Port 1 View  12/28/2012   *RADIOLOGY REPORT*  Clinical Data: Fever, former smoking history, diabetes  PORTABLE CHEST - 1 VIEW  Comparison: Chest x-ray of  12/28/2012 and 08/12/2012  Findings: The rounded mass-like opacity in the left perihilar region is again noted and may represent pneumonia.  However malignancy cannot be excluded and follow-up chest x-ray or CT of the chest is recommended.  There is cardiomegaly present and there is a question of very mild pulmonary vascular congestion as well. No acute bony abnormality is seen.  IMPRESSION:  1.  Rounded opacity in the left perihilar region may represent pneumonia but a malignancy cannot be excluded.  Recommend follow-up chest x-ray and possible CT of the chest. 2.  Cardiomegaly.  Minimal pulmonary vascular congestion.   Original Report Authenticated By: Ivar Drape, M.D.         Subjective: Patient denies fevers, chills, chest discomfort, shortness of breath, nausea, vomiting, diarrhea, abdominal pain. He is eating better.  Objective: Filed Vitals:   12/30/12 2129 12/31/12 0525 12/31/12 0837 12/31/12 0914  BP:  128/58  115/44  Pulse: 64 66 68 74  Temp:  98.2 F (36.8 C)    TempSrc:  Oral    Resp:  20 18   Height:      Weight:  100.7 kg (222 lb 0.1 oz)    SpO2:  94% 92% 93%    Intake/Output Summary (Last 24 hours) at 12/31/12 1120 Last data filed at 12/31/12 [id number]  Gross per 24 hour  Intake   2320 ml  Output    800 ml  Net   1520 ml   Weight change: 1.6 kg (3 lb 8.4 oz) Exam:   General:  Pt is alert, follows commands appropriately, not in acute distress  HEENT: No icterus, No thrush, [place]/AT  Cardiovascular: RRR, S1/S2, no rubs, no gallops  Respiratory: Left basilar crackles. Right foot auscultation. No wheezes or rhonchi. Good air movement.  Abdomen: Soft/+BS, non tender, non distended, no guarding  Extremities: trace LE edema, No lymphangitis, No petechiae, No rashes, no synovitis  Data Reviewed: Basic Metabolic Panel:  Recent Labs Lab 12/28/12 1747 12/29/12 0448 12/30/12 0558 12/31/12 0430  NA 139 138 137 138  K 4.4 3.9 3.7 4.1  CL 99 99 102 104  CO2 29 28  27 27   GLUCOSE 175* 175* 84 103*  BUN 24* 28* 44* 53*  CREATININE 1.82* 1.83* 2.41* 2.57*  CALCIUM 11.1* 10.0 9.4 9.6   Liver Function Tests:  Recent Labs Lab 12/28/12 1747  AST 14  ALT 14  ALKPHOS 56  BILITOT 0.7  PROT 8.6*  ALBUMIN 4.0   No results found for this basename: LIPASE, AMYLASE,  in the last 168 hours No results found for this basename: AMMONIA,  in the last 168 hours CBC:  Recent Labs Lab 12/28/12 1747 12/29/12 0448 12/30/12 0558 12/31/12 0430  WBC 15.0* 14.8* 10.8* 7.7  HGB 15.9 14.5 12.2* 12.3*  HCT 45.2 41.3 35.6* 36.4*  MCV 92.1 92.6 92.7 93.1  PLT 160 134* 132* 133*   Cardiac Enzymes: No results found for this basename: CKTOTAL, CKMB, CKMBINDEX, TROPONINI,  in the last 168 hours BNP: No components found with this basename: POCBNP,  CBG:  Recent Labs Lab 12/30/12 1140 12/30/12 1611 12/30/12 1640 12/30/12 2111 12/31/12 0643  GLUCAP 89 48* 93 90 111*    Recent Results (from the past 240 hour(s))  URINE CULTURE     Status: None   Collection Time    12/28/12  6:52 PM      Result Value Range Status   Specimen Description URINE, CLEAN CATCH   Final   Special Requests NONE   Final  Culture  Setup Time 12/28/2012 19:56   Final   Colony Count NO GROWTH   Final   Culture NO GROWTH   Final   Report Status 12/29/2012 FINAL   Final  CULTURE, BLOOD (ROUTINE X 2)     Status: None   Collection Time    12/29/12 10:00 AM      Result Value Range Status   Specimen Description BLOOD LEFT ANTECUBITAL   Final   Special Requests BOTTLES DRAWN AEROBIC AND ANAEROBIC 10CC   Final   Culture  Setup Time 12/29/2012 16:46   Final   Culture     Final   Value:        BLOOD CULTURE RECEIVED NO GROWTH TO DATE CULTURE WILL BE HELD FOR 5 DAYS BEFORE ISSUING A FINAL NEGATIVE REPORT   Report Status PENDING   Incomplete  CULTURE, BLOOD (ROUTINE X 2)     Status: None   Collection Time    12/29/12 10:10 AM      Result Value Range Status   Specimen Description BLOOD  RIGHT ANTECUBITAL   Final   Special Requests     Final   Value: BOTTLES DRAWN AEROBIC AND ANAEROBIC Pray AER 5CC ANA   Culture  Setup Time 12/29/2012 16:45   Final   Culture     Final   Value:        BLOOD CULTURE RECEIVED NO GROWTH TO DATE CULTURE WILL BE HELD FOR 5 DAYS BEFORE ISSUING A FINAL NEGATIVE REPORT   Report Status PENDING   Incomplete  URINE CULTURE     Status: None   Collection Time    12/29/12  4:50 PM      Result Value Range Status   Specimen Description URINE, RANDOM   Final   Special Requests NONE   Final   Culture  Setup Time 12/29/2012 17:24   Final   Colony Count NO GROWTH   Final   Culture NO GROWTH   Final   Report Status 12/30/2012 FINAL   Final     Scheduled Meds: . amLODipine  10 mg Oral Daily  . azithromycin  500 mg Intravenous Q24H  . budesonide-formoterol  2 puff Inhalation BID  . cefTRIAXone (ROCEPHIN)  IV  1 g Intravenous Q24H  . enoxaparin (LOVENOX) injection  40 mg Subcutaneous Q24H  . fenofibrate  54 mg Oral Daily  . fluticasone  1 spray Each Nare Daily  . hydrALAZINE  25 mg Oral Q8H  . insulin aspart  0-9 Units Subcutaneous TID WC  . metoprolol tartrate  25 mg Oral BID  . montelukast  10 mg Oral QHS  . PARoxetine  20 mg Oral [id number]  . sodium chloride  3 mL Intravenous Q12H  . sodium chloride  3 mL Intravenous Q12H  . traZODone  150 mg Oral QHS   Continuous Infusions: . dextrose 5 % and 0.9% NaCl 75 mL/hr at 12/30/12 1640     Jakerria Kingbird, DO  [facility] Pager [phone]  If 7PM-7AM, please contact night-coverage [email or web address] Password [email or web address] 12/31/2012, 11:20 AM   LOS: 3 days

## 2012-12-31 NOTE — Clinical Social Work Psychosocial (Signed)
    Clinical Social Work Department BRIEF PSYCHOSOCIAL ASSESSMENT 12/31/2012  Patient:  Ian [name], Ian [name]     Account Number:  [id number]     Admit date:  12/28/2012  Clinical Social Worker:  Edwyna Shell, CLINICAL SOCIAL WORKER  Date/Time:  12/31/2012 12:00 N  Referred by:  Physician  Date Referred:  12/31/2012 Referred for  SNF Placement   Other Referral:   Interview type:  Patient Other interview type:   Also spoke w wife and sons at bedside.    PSYCHOSOCIAL DATA Living Status:   Admitted from facility:   Level of care:   Primary support name:   Primary support relationship to patient:   Degree of support available:    CURRENT CONCERNS  Other Concerns:    SOCIAL WORK ASSESSMENT / PLAN CSW met w patient at bedside, patient alert and oriented x4.  Somewhat hard of hearing.  Wife and two sons present and participated in assessment w patient consent.  Patient was in his usual state of health until wife noticed "he was not right" this past Monday.  Usually goes to gym and walks 40 minutes/day.  Has good social relationships and good support.  Family involved, one son in [place], other lives in [place].  Prior to current episode of illness, patient was completely independent, able to drive, meet all ADLs by himself.  Wife current in tx for cancer, unable to provide physical care patient may need at home due to her strength limitations.  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    Explained placement process to patient, agreeable to seeking SNF bed.  Understands that insurance must authorize and copays may be required depending on insurance coverage. Willing to proceed w placement process.  Will keep family and patient informed.   Assessment/plan status:  Psychosocial Support/Ongoing Assessment of Needs Other assessment/ plan:   Information/referral to community resources:   SNF list    PATIENT'S/FAMILY'S RESPONSE TO PLAN OF  CARE: Wife states she prefers placement at [facility] or [facility].  Patient and wife informed that patient insurance must approve SNF placement and copays will be determined by insurance policy.  Patient and wife willing to proceed w placement process.  Edwyna Shell, LCSW Clinical Social Worker [phone])

## 2012-12-31 NOTE — Clinical Social Work Placement (Addendum)
     Clinical Social Work Department CLINICAL SOCIAL WORK PLACEMENT NOTE 01/04/2013  Patient:  Ian [name], Ian [name]  Account Number:  [id number] Admit date:  12/28/2012  Clinical Social Worker:  Edwyna Shell, CLINICAL SOCIAL WORKER  Date/time:  12/31/2012 01:00 PM  Clinical Social Work is seeking post-discharge placement for this patient at the following level of care:   [name]   (*CSW will update this form in Epic as items are completed)   12/31/2012  Patient/family provided with [place] Department of Clinical Social Works list of facilities offering this level of care within the geographic area requested by the patient (or if unable, by the patients family).  12/31/2012  Patient/family informed of their freedom to choose among providers that offer the needed level of care, that participate in Medicare, Medicaid or managed care program needed by the patient, have an available bed and are willing to accept the patient.  12/31/2012  Patient/family informed of [facility] ownership interest in [facility], as well as of the fact that they are under no obligation to receive care at this facility.  PASARR submitted to EDS on 12/31/2012 PASARR number received from EDS on 12/31/2012  FL2 transmitted to all facilities in geographic area requested by pt/family on  12/31/2012 FL2 transmitted to all facilities within larger geographic area on   Patient informed that his/her managed care company has contracts with or will negotiate with  certain facilities, including the following:   [name]     Patient/family informed of bed offers received:  01/01/2013 Patient chooses bed at [facility] Physician recommends and patient chooses bed at    Patient to be transferred to [facility] on  01/04/2013 Patient to be transferred to facility by Ambulance  [name])  The following physician request were entered in Epic:   Additional Comments: Patient 's wife requests  [facility] or [facility]. PASARR submitted, needed nurse review.  01/04/13.  DC delayed on friday 01/01/13 due to patient not being stable for d/c.  ok today per MD for d/c to SNF. Notified facility, patient and his son [name].  Admission paperwork has been completed.  Insurance Edyth Gunnels in place [id number].  No further CSW needs identified.CSW signing off.  [name]. [place], [place]

## 2012-12-31 NOTE — Clinical Social Work Note (Signed)
Information faxed to insurance company for their review.  Gordan Dorrance alerted to fax.  CSW contacted [facility] and [facility] to let them know patient preference for their facilities.  Edwyna Shell, LCSW Clinical Social Worker [phone])

## 2013-01-01 ENCOUNTER — Inpatient Hospital Stay (HOSPITAL_COMMUNITY): Payer: PRIVATE HEALTH INSURANCE

## 2013-01-01 LAB — BASIC METABOLIC PANEL
CO2: 26 mEq/L (ref 19–32)
Chloride: 103 mEq/L (ref 96–112)
GFR calc Af Amer: 33 mL/min — ABNORMAL LOW (ref >90)
Potassium: 4 mEq/L (ref 3.5–5.1)
Sodium: 139 mEq/L (ref 135–145)

## 2013-01-01 LAB — GLUCOSE, CAPILLARY
Glucose-Capillary: 158 mg/dL — ABNORMAL HIGH (ref 70–99)
Glucose-Capillary: 186 mg/dL — ABNORMAL HIGH (ref 70–99)

## 2013-01-01 LAB — CBC
MCV: 93.3 fL (ref 78.0–100.0)
Platelets: 132 10*3/uL — ABNORMAL LOW (ref 150–400)
RBC: 4.05 MIL/uL — ABNORMAL LOW (ref 4.22–5.81)
RDW: 14.2 % (ref 11.5–15.5)
WBC: 8 10*3/uL (ref 4.0–10.5)

## 2013-01-01 LAB — LEGIONELLA ANTIGEN, URINE

## 2013-01-01 LAB — PRO B NATRIURETIC PEPTIDE: Pro B Natriuretic peptide (BNP): 788.4 pg/mL — ABNORMAL HIGH (ref 0–125)

## 2013-01-01 MED ORDER — FUROSEMIDE 40 MG PO TABS
40.0000 mg | ORAL_TABLET | Freq: Once | ORAL | Status: AC
  Administered 2013-01-01: 40 mg via ORAL
  Filled 2013-01-01: qty 1

## 2013-01-01 NOTE — Progress Notes (Signed)
Occupational Therapy Treatment Patient Details Name: Ian [name] MRN: [id number] DOB: [birth year] Today's Date: 01/01/2013 Time: [id number] OT Time Calculation (min): 19 min  OT Assessment / Plan / Recommendation  OT comments  Pt making slow but steady progress overall with OT but still with limited endurance and balance issues.  Overall min assist to min guard assist for ADLs.  Feel pt will continue to benefit from OT.  Follow Up Recommendations  SNF       Equipment Recommendations  Tub/shower seat       Frequency Min 2X/week   Progress towards OT Goals Progress towards OT goals: Progressing toward goals  Plan Discharge plan remains appropriate;Discharge plan needs to be updated    Precautions / Restrictions Precautions Precautions: Fall Precaution Comments: monitor O2 sats Restrictions Weight Bearing Restrictions: No   Pertinent Vitals/Pain Pt reports minimal back pain no medication needed currently, O2 sats 92% at rest on 2.5 Ls decreasing to 86% with activity    ADL  Grooming: Performed;Min guard Where Assessed - Grooming: Unsupported standing Toilet Transfer: Performed;Minimal assistance Toilet Transfer Method: Other (comment) (ambulate without assistive device) Toilet Transfer Equipment: Comfort height toilet Toileting - Clothing Manipulation and Hygiene: Performed;Min guard Where Assessed - Toileting Clothing Manipulation and Hygiene: Sit to stand from 3-in-1 or toilet Equipment Used: Gait belt;Other (comment) (O2) Transfers/Ambulation Related to ADLs: Pt ambulated to the bathroom with min assist and no assisitve device.  Pt's O2 sats decreased to 86% on room air.  ADL Comments: Pt needing min assist for steadiness with mobility.  Only able to tolerate being up in standing, including walking for 2-3 mins before fatiguing and needing rest break.  Plan for SNF rehab on Monday 6/30.      OT Goals(current goals can now be found in the care plan section) Acute Rehab OT  Goals Patient Stated Goal: To get back home and be well  Visit Information  Last OT Received On: 01/01/13 Assistance Needed: +1 History of Present Illness: 77 y.o. male admitted to [facility] for fever and PNA.  He is currently requiring supplemental O2 to maintain sats >90%.            Cognition  Cognition Arousal/Alertness: Awake/alert Behavior During Therapy: WFL for tasks assessed/performed Overall Cognitive Status: Within Functional Limits for tasks assessed General Comments: extremely HOH    Mobility  Bed Mobility Bed Mobility: Rolling Left;Left Sidelying to Sit Rolling Left: 5: Supervision;With rail Left Sidelying to Sit: 4: Min guard;With rails;HOB elevated Details for Bed Mobility Assistance: Pt needed min instructional cueing for technique to roll to the left and push up on the hospital bed rail. Transfers Transfers: Sit to Stand Sit to Stand: 4: Min guard;From toilet;With upper extremity assist Stand to Sit: 4: Min guard;To chair/3-in-1;With upper extremity assist       Balance Balance Balance Assessed: Yes Dynamic Standing Balance Dynamic Standing - Balance Support: No upper extremity supported Dynamic Standing - Level of Assistance: 4: Min assist   End of Session OT - End of Session Equipment Utilized During Treatment: Gait belt Activity Tolerance: Patient limited by fatigue Patient left: in chair;with call bell/phone within reach;with family/visitor present     [place] OTR/L Pager number [phone] 01/01/2013, 10:27 AM

## 2013-01-01 NOTE — Plan of Care (Signed)
Problem: Phase I Progression Outcomes Goal: First antibiotic given within 6hrs of admit Outcome: Completed/Met Date Met:  01/01/13 abx given within 6 hrs Goal: Voiding-avoid urinary catheter unless indicated Outcome: Completed/Met Date Met:  01/01/13 Pt voiding adequate amts of urine, no need for foley  Problem: Phase II Progression Outcomes Goal: Tolerating diet Outcome: Completed/Met Date Met:  01/01/13 Pt tolerating diet, no c/o n/v

## 2013-01-01 NOTE — Progress Notes (Signed)
Per MD- patient is not yet stable for d/c to SNF.  Plan possible d/c on Monday 01/04/13.  Hassan Rowan at [facility] was notified and will need to send her updated clinicals Monday morning.  CSW will monitor. [name]. [place], [place]

## 2013-01-01 NOTE — Progress Notes (Signed)
[facility] PROGRESS NOTE  Ian [name] [id number] DOB: 04-Sep-1935 DOA: 12/28/2012 PCP: Hollace Kinnier, DO  Assessment/Plan: Community acquired pneumonia  -Continue ceftriaxone and azithromycin  -WBC improving  -Urine Legionella antigen, urine Streptococcus pneumonia antigen  -CT chest shows irregular left upper lobe lingular opacity, likely pneumonia  Hypoxemia -Oxygen saturation has been gradually decreasing, as it can increase to 4 L -Chest x-ray suggests vascular congestion -ProBNP 788 -Clinically patient appears fluid overloaded -Furosemide 40 mg by mouth x1 Acute on chronic renal failure  -Gradually improving -suspect component of  losartan and lisinopril with infection -D5NS--1 liter given 6/25-->creatinine appears to be plateauing but still on rise  -Renal ultrasound--no hydronephrosis  -consult nephrology  Diabetes mellitus with proteinuria  -Discontinue Amaryl, patient mildly hypoglycemic-->resolved  -Continue NovoLog sliding scale  COPD  -Clinically stable  -Continue Symbicort  Hypertension  -Continue metoprolol tartrate and hydralazine  -Discontinue lisinopril and losartan  Hyperlipidemia  -Continue fenofibrate  Deconditioning  -Continue PT  Family Communication: Wife and son at beside  Disposition Plan: SNF when medically stable  Antibiotics:  Ceftriaxone 12/28/2012>>>  Azithromycin 12/28/2012>>>         Procedures/Studies: Dg Chest 2 View  01/01/2013   *RADIOLOGY REPORT*  Clinical Data: 77 year old male with shortness of breath.  CHF, pneumonia.  CHEST - 2 VIEW  Comparison: 12/31/2012 and earlier.  Findings: Sitting AP and lateral views of the chest at 0832 hours. Continued left peripheral/perihilar airspace disease.  This does appear mildly regressed.  Small left pleural effusion. Stable cardiomegaly and mediastinal contours.  No overt pulmonary edema. No pneumothorax. No acute osseous abnormality identified.  IMPRESSION: 1.  Left  perihilar/midlung airspace disease persists but does appear mildly regressed.  Recommend post-treatment study to document full resolution (see chest CT report 12/29/2012). 2.  Small left pleural effusion.   Original Report Authenticated By: Roselyn Reef, M.D.   Dg Chest 2 View  12/31/2012   *RADIOLOGY REPORT*  Clinical Data: Pneumonia.  Shortness of breath.  CHEST - 2 VIEW  Comparison: Chest x-rays dated 12/28/2012 and a chest CT dated 12/29/2012  Findings: There has been appreciable progression of the consolidation in the left upper lobe peripherally with new atelectasis at the left lung base posteriorly.  Chronic cardiomegaly.  Slight pulmonary vascular prominence.  Right lung is clear.  No acute osseous abnormality.  IMPRESSION: Progression of left upper lobe pneumonia.  New atelectasis at the left lung base.   Original Report Authenticated By: Lorriane Shire, M.D.   Dg Chest 2 View  12/28/2012   *RADIOLOGY REPORT*  Clinical Data: Fever and cough  CHEST - 2 VIEW  Comparison: 08/12/2012  Findings: Two-view exam shows patchy left perihilar and basilar airspace disease.  No focal consolidation evident within the right lung.  No pulmonary edema. The cardiopericardial silhouette is enlarged.  No evidence for pleural effusion. Imaged bony structures of the thorax are intact.  IMPRESSION: Patchy airspace disease in the left perihilar region and left base suggests pneumonia.  Follow-up imaging is recommended to ensure complete resolution.   Original Report Authenticated By: Misty Stanley, M.D.   Ct Chest Wo Contrast  12/29/2012   *RADIOLOGY REPORT*  Clinical Data: Follow-up abnormal chest x-ray.  CT CHEST WITHOUT CONTRAST  Technique:  Multidetector CT imaging of the chest was performed following the standard protocol without IV contrast.  Comparison: Chest x-ray 12/28/2012  Findings: Irregular airspace opacity noted in the lingula of the left upper lobe corresponding to the density seen on prior chest x- ray.  I  suspect this represents pneumonia, but follow-up after treatment is warranted to exclude malignancy.  The remainder of the lungs are clear.  No pleural effusions.  Mild cardiomegaly. Scattered coronary artery and aortic calcifications.  No evidence of aortic aneurysm. No mediastinal, hilar, or axillary adenopathy. Visualized thyroid and chest wall soft tissues unremarkable.  Imaging into the upper abdomen shows no acute findings.  Small layering gallstones partially imaged within the gallbladder.  On no acute bony abnormality.  IMPRESSION: Irregular airspace density within the lingula corresponding to the abnormality on chest x-ray.  I favor this represents pneumonia, recommend follow up after treatment to assure resolution and exclude malignancy.  Cardiomegaly, coronary artery disease.  Cholelithiasis.   Original Report Authenticated By: [name], M.D.   US Renal  12/30/2012   *RADIOLOGY REPORT*  Clinical Data: 77 year old male with acute renal failure.  RENAL/URINARY TRACT ULTRASOUND COMPLETE  Comparison:  10/13/2009.  Findings:  Right Kidney:  No hydronephrosis.  Renal length 11.4 cm.  Cortical echotexture stable within normal limits.  Unchanged small upper pole simple appearing cyst.  Left Kidney:  No hydronephrosis.  Renal length 10.7 cm.  Cortical echotexture within normal limits.  Bladder:  Unremarkable.  IMPRESSION: Stable and negative sonographic appearance of the kidneys and bladder.   Original Report Authenticated By: Roselyn Reef, M.D.   Dg Chest Port 1 View  12/28/2012   *RADIOLOGY REPORT*  Clinical Data: Fever, former smoking history, diabetes  PORTABLE CHEST - 1 VIEW  Comparison: Chest x-ray of 12/28/2012 and 08/12/2012  Findings: The rounded mass-like opacity in the left perihilar region is again noted and may represent pneumonia.  However malignancy cannot be excluded and follow-up chest x-ray or CT of the chest is recommended.  There is cardiomegaly present and there is a question of very  mild pulmonary vascular congestion as well. No acute bony abnormality is seen.  IMPRESSION:  1.  Rounded opacity in the left perihilar region may represent pneumonia but a malignancy cannot be excluded.  Recommend follow-up chest x-ray and possible CT of the chest. 2.  Cardiomegaly.  Minimal pulmonary vascular congestion.   Original Report Authenticated By: Ivar Drape, M.D.         Subjective: Patient appeared more short of breath. He denies any nausea, vomiting, diarrhea, chest pain, fevers, chills. No dysuria hematuria.  Objective: Filed Vitals:   01/01/13 0942 01/01/13 1019 01/01/13 1036 01/01/13 1323  BP: 151/71   145/74  Pulse: 72 75  68  Temp:   99.3 F (37.4 C) 98.9 F (37.2 C)  TempSrc:   Oral Oral  Resp:    18  Height:      Weight:      SpO2: 92% 92%  92%    Intake/Output Summary (Last 24 hours) at 01/01/13 1900 Last data filed at 01/01/13 1742  Gross per 24 hour  Intake   1400 ml  Output   1240 ml  Net    160 ml   Weight change: 0.089 kg (3.2 oz) Exam:   General:  Pt is alert, follows commands appropriately, not in acute distress  HEENT: No icterus, No thrush, No neck mass, [place]/AT  Cardiovascular: RRR, S1/S2, no rubs, no gallops  Respiratory: CTA bilaterally, no wheezing, no crackles, no rhonchi  Abdomen: Soft/+BS, non tender, non distended, no guarding  Extremities: No edema, No lymphangitis, No petechiae, No rashes, no synovitis  Data Reviewed: Basic Metabolic Panel:  Recent Labs Lab 12/28/12 1747 12/29/12 0448 12/30/12 [id number] 12/31/12 0430 01/01/13 0455  NA 139 138 137 138 139  K 4.4 3.9 3.7 4.1 4.0  CL 99 99 102 104 103  CO2 29 28 27 27 26   GLUCOSE 175* 175* 84 103* 181*  BUN 24* 28* 44* 53* 48*  CREATININE 1.82* 1.83* 2.41* 2.57* 2.14*  CALCIUM 11.1* 10.0 9.4 9.6 9.6   Liver Function Tests:  Recent Labs Lab 12/28/12 1747  AST 14  ALT 14  ALKPHOS 56  BILITOT 0.7  PROT 8.6*  ALBUMIN 4.0   No results found for this basename:  LIPASE, AMYLASE,  in the last 168 hours No results found for this basename: AMMONIA,  in the last 168 hours CBC:  Recent Labs Lab 12/28/12 1747 12/29/12 0448 12/30/12 0558 12/31/12 0430 01/01/13 0900  WBC 15.0* 14.8* 10.8* 7.7 8.0  HGB 15.9 14.5 12.2* 12.3* 12.8*  HCT 45.2 41.3 35.6* 36.4* 37.8*  MCV 92.1 92.6 92.7 93.1 93.3  PLT 160 134* 132* 133* 132*   Cardiac Enzymes: No results found for this basename: CKTOTAL, CKMB, CKMBINDEX, TROPONINI,  in the last 168 hours BNP: No components found with this basename: POCBNP,  CBG:  Recent Labs Lab 12/31/12 1602 12/31/12 2121 01/01/13 0603 01/01/13 1114 01/01/13 1549  GLUCAP 135* 132* 158* 186* 149*    Recent Results (from the past 240 hour(s))  URINE CULTURE     Status: None   Collection Time    12/28/12  6:52 PM      Result Value Range Status   Specimen Description URINE, CLEAN CATCH   Final   Special Requests NONE   Final   Culture  Setup Time 12/28/2012 19:56   Final   Colony Count NO GROWTH   Final   Culture NO GROWTH   Final   Report Status 12/29/2012 FINAL   Final  CULTURE, BLOOD (ROUTINE X 2)     Status: None   Collection Time    12/29/12 10:00 AM      Result Value Range Status   Specimen Description BLOOD LEFT ANTECUBITAL   Final   Special Requests BOTTLES DRAWN AEROBIC AND ANAEROBIC 10CC   Final   Culture  Setup Time 12/29/2012 16:46   Final   Culture     Final   Value:        BLOOD CULTURE RECEIVED NO GROWTH TO DATE CULTURE WILL BE HELD FOR 5 DAYS BEFORE ISSUING A FINAL NEGATIVE REPORT   Report Status PENDING   Incomplete  CULTURE, BLOOD (ROUTINE X 2)     Status: None   Collection Time    12/29/12 10:10 AM      Result Value Range Status   Specimen Description BLOOD RIGHT ANTECUBITAL   Final   Special Requests     Final   Value: BOTTLES DRAWN AEROBIC AND ANAEROBIC [place] AER 5CC ANA   Culture  Setup Time 12/29/2012 16:45   Final   Culture     Final   Value:        BLOOD CULTURE RECEIVED NO GROWTH TO DATE  CULTURE WILL BE HELD FOR 5 DAYS BEFORE ISSUING A FINAL NEGATIVE REPORT   Report Status PENDING   Incomplete  URINE CULTURE     Status: None   Collection Time    12/29/12  4:50 PM      Result Value Range Status   Specimen Description URINE, RANDOM   Final   Special Requests NONE   Final   Culture  Setup Time 12/29/2012 17:24   Final   Colony  Count NO GROWTH   Final   Culture NO GROWTH   Final   Report Status 12/30/2012 FINAL   Final     Scheduled Meds: . amLODipine  10 mg Oral Daily  . azithromycin  500 mg Intravenous Q24H  . budesonide-formoterol  2 puff Inhalation BID  . cefTRIAXone (ROCEPHIN)  IV  1 g Intravenous Q24H  . enoxaparin (LOVENOX) injection  30 mg Subcutaneous Q24H  . fenofibrate  54 mg Oral Daily  . fluticasone  1 spray Each Nare Daily  . furosemide  40 mg Oral Once  . hydrALAZINE  25 mg Oral Q8H  . insulin aspart  0-9 Units Subcutaneous TID WC  . metoprolol tartrate  25 mg Oral BID  . montelukast  10 mg Oral QHS  . PARoxetine  20 mg Oral BH-q7a  . sodium chloride  3 mL Intravenous Q12H  . traZODone  150 mg Oral QHS   Continuous Infusions:    Deanndra Kirley, DO  [facility] Pager [phone]  If 7PM-7AM, please contact night-coverage [email or web address] Password [email or web address] 01/01/2013, 7:00 PM   LOS: 4 days

## 2013-01-02 DIAGNOSIS — E8770 Fluid overload, unspecified: Secondary | ICD-10-CM

## 2013-01-02 DIAGNOSIS — E8779 Other fluid overload: Secondary | ICD-10-CM

## 2013-01-02 LAB — BASIC METABOLIC PANEL
BUN: 37 mg/dL — ABNORMAL HIGH (ref 6–23)
Creatinine, Ser: 1.81 mg/dL — ABNORMAL HIGH (ref 0.50–1.35)
GFR calc non Af Amer: 35 mL/min — ABNORMAL LOW (ref >90)
Glucose, Bld: 214 mg/dL — ABNORMAL HIGH (ref 70–99)
Potassium: 3.8 mEq/L (ref 3.5–5.1)

## 2013-01-02 LAB — GLUCOSE, CAPILLARY
Glucose-Capillary: 189 mg/dL — ABNORMAL HIGH (ref 70–99)
Glucose-Capillary: 168 mg/dL — ABNORMAL HIGH (ref 70–99)

## 2013-01-02 MED ORDER — FUROSEMIDE 10 MG/ML IJ SOLN: 20.0000 mg | Freq: Once | INTRAMUSCULAR | Status: DC

## 2013-01-02 MED ORDER — FUROSEMIDE 10 MG/ML IJ SOLN
40.0000 mg | Freq: Once | INTRAMUSCULAR | Status: AC
  Administered 2013-01-02: 40 mg via INTRAVENOUS
  Filled 2013-01-02: qty 4

## 2013-01-02 MED ORDER — ENOXAPARIN SODIUM 40 MG/0.4ML ~~LOC~~ SOLN
40.0000 mg | SUBCUTANEOUS | Status: DC
  Administered 2013-01-03 – 2013-01-04 (×2): 40 mg via SUBCUTANEOUS
  Filled 2013-01-02 (×2): qty 0.4

## 2013-01-02 NOTE — Progress Notes (Signed)
The patient was restless the first half of the night and tried to get out of bed multiple times without assistance.  The bed alarm remained on all night.  He also complained of SOB and headache at 0200.  His O2 was bumped up to 3L and he was repositioned in bed to a sitting position.  His O2 sats were in the mid-90s.  After the episode and some Tylenol, he rested comfortably throughout the night.  The patient stated that he felt fine this morning and did not have any complaints at this time.

## 2013-01-02 NOTE — Progress Notes (Addendum)
[facility] PROGRESS NOTE  Ian [name] [id number] DOB: [birth year] DOA: 12/28/2012 PCP: Hollace Kinnier, DO  Brief history 77 year old male with history of diabetes mellitus type 2, hypertension, COPD, hyperlipidemia presented to the emergency department on 12/29/2012 with worsening cough.. Patient also had mild fever. Patient was found to be confused and had some difficulty walking due to worsening dyspnea.In the ER patient was found to be febrile and blood work showed leukocytosis and chest x-ray showed left sided opacity concerning for pneumonia. Patient has been started on antibiotics for pneumonia. Patient's wife states that last month patient had lot of postnasal drip and cough and was prescribed antibiotics but had not taken it. The patient was started on ceftriaxone and azithromycin. The patient presenting serum creatinine was 1.82. His only prior labs were performed in April 2011 which revealed a serum creatinine of 1.22 at that time. The patient's serum creatinine worsened through the hospitalization. He was thought to be due to the patient's pneumonia as well as his home use of lisinopril and losartan. The patient was given fluids. Nephrology was consulted. Renal ultrasound was negative for hydronephrosis. The patient's serum creatinine gradually improved. Unfortunately, the patient became fluid overloaded. The patient was started on furosemide. Echocardiogram was ordered.   Assessment/Plan: Community acquired pneumonia  -Continue ceftriaxone and azithromycin  -WBC improved  -Urine Legionella antigen, urine Streptococcus pneumonia antigen--neg  -CT chest shows irregular left upper lobe lingular opacity, likely pneumonia  Hypoxemia/fluid overload -Oxygen saturation has been gradually decreasing, as it can increase to 4 L  -Chest x-ray suggests vascular congestion  -ProBNP 788  -Clinically patient appears fluid overloaded  -Furosemide 40 mg by mouth x1 (01/01/13) -furosemide 40mg  IV  x one --01/02/13 -echocardiogram Acute on chronic renal failure  -Gradually improving  -suspect component of losartan and lisinopril with infection  -Renal ultrasound--no hydronephrosis  -appreciated nephrology  Diabetes mellitus with proteinuria  -Discontinue Amaryl, patient mildly hypoglycemic-->resolved  -Continue NovoLog sliding scale  COPD  -Clinically stable  -Continue Symbicort  Hypertension  -Continue metoprolol tartrate and hydralazine  -Discontinue lisinopril and losartan  Hyperlipidemia  -Continue fenofibrate  Deconditioning  -Continue PT  Family Communication: Wife and son at beside  Disposition Plan: SNF when medically stable  Antibiotics:  Ceftriaxone 12/28/2012>>>  Azithromycin 12/28/2012>>>   Family Communication:   Wife and son at beside Disposition Plan:   Home when medically stable         Procedures/Studies: Dg Chest 2 View  01/01/2013   *RADIOLOGY REPORT*  Clinical Data: 77 year old male with shortness of breath.  CHF, pneumonia.  CHEST - 2 VIEW  Comparison: 12/31/2012 and earlier.  Findings: Sitting AP and lateral views of the chest at 0832 hours. Continued left peripheral/perihilar airspace disease.  This does appear mildly regressed.  Small left pleural effusion. Stable cardiomegaly and mediastinal contours.  No overt pulmonary edema. No pneumothorax. No acute osseous abnormality identified.  IMPRESSION: 1.  Left perihilar/midlung airspace disease persists but does appear mildly regressed.  Recommend post-treatment study to document full resolution (see chest CT report 12/29/2012). 2.  Small left pleural effusion.   Original Report Authenticated By: Roselyn Reef, M.D.   Dg Chest 2 View  12/31/2012   *RADIOLOGY REPORT*  Clinical Data: Pneumonia.  Shortness of breath.  CHEST - 2 VIEW  Comparison: Chest x-rays dated 12/28/2012 and a chest CT dated 12/29/2012  Findings: There has been appreciable progression of the consolidation in the left upper lobe  peripherally with new atelectasis at the left lung base posteriorly.  Chronic cardiomegaly.  Slight pulmonary vascular prominence.  Right lung is clear.  No acute osseous abnormality.  IMPRESSION: Progression of left upper lobe pneumonia.  New atelectasis at the left lung base.   Original Report Authenticated By: Lorriane Shire, M.D.   Dg Chest 2 View  12/28/2012   *RADIOLOGY REPORT*  Clinical Data: Fever and cough  CHEST - 2 VIEW  Comparison: 08/12/2012  Findings: Two-view exam shows patchy left perihilar and basilar airspace disease.  No focal consolidation evident within the right lung.  No pulmonary edema. The cardiopericardial silhouette is enlarged.  No evidence for pleural effusion. Imaged bony structures of the thorax are intact.  IMPRESSION: Patchy airspace disease in the left perihilar region and left base suggests pneumonia.  Follow-up imaging is recommended to ensure complete resolution.   Original Report Authenticated By: Misty Stanley, M.D.   Ct Chest Wo Contrast  12/29/2012   *RADIOLOGY REPORT*  Clinical Data: Follow-up abnormal chest x-ray.  CT CHEST WITHOUT CONTRAST  Technique:  Multidetector CT imaging of the chest was performed following the standard protocol without IV contrast.  Comparison: Chest x-ray 12/28/2012  Findings: Irregular airspace opacity noted in the lingula of the left upper lobe corresponding to the density seen on prior chest x- ray.  I suspect this represents pneumonia, but follow-up after treatment is warranted to exclude malignancy.  The remainder of the lungs are clear.  No pleural effusions.  Mild cardiomegaly. Scattered coronary artery and aortic calcifications.  No evidence of aortic aneurysm. No mediastinal, hilar, or axillary adenopathy. Visualized thyroid and chest wall soft tissues unremarkable.  Imaging into the upper abdomen shows no acute findings.  Small layering gallstones partially imaged within the gallbladder.  On no acute bony abnormality.  IMPRESSION:  Irregular airspace density within the lingula corresponding to the abnormality on chest x-ray.  I favor this represents pneumonia, recommend follow up after treatment to assure resolution and exclude malignancy.  Cardiomegaly, coronary artery disease.  Cholelithiasis.   Original Report Authenticated By: [name], M.D.   US Renal  12/30/2012   *RADIOLOGY REPORT*  Clinical Data: 77 year old male with acute renal failure.  RENAL/URINARY TRACT ULTRASOUND COMPLETE  Comparison:  10/13/2009.  Findings:  Right Kidney:  No hydronephrosis.  Renal length 11.4 cm.  Cortical echotexture stable within normal limits.  Unchanged small upper pole simple appearing cyst.  Left Kidney:  No hydronephrosis.  Renal length 10.7 cm.  Cortical echotexture within normal limits.  Bladder:  Unremarkable.  IMPRESSION: Stable and negative sonographic appearance of the kidneys and bladder.   Original Report Authenticated By: Roselyn Reef, M.D.   Dg Chest Port 1 View  12/28/2012   *RADIOLOGY REPORT*  Clinical Data: Fever, former smoking history, diabetes  PORTABLE CHEST - 1 VIEW  Comparison: Chest x-ray of 12/28/2012 and 08/12/2012  Findings: The rounded mass-like opacity in the left perihilar region is again noted and may represent pneumonia.  However malignancy cannot be excluded and follow-up chest x-ray or CT of the chest is recommended.  There is cardiomegaly present and there is a question of very mild pulmonary vascular congestion as well. No acute bony abnormality is seen.  IMPRESSION:  1.  Rounded opacity in the left perihilar region may represent pneumonia but a malignancy cannot be excluded.  Recommend follow-up chest x-ray and possible CT of the chest. 2.  Cardiomegaly.  Minimal pulmonary vascular congestion.   Original Report Authenticated By: Ivar Drape, M.D.         Subjective: Patient is breathing  a little bit better than yesterday. Denies any fevers, chills, chest discomfort, nausea, vomiting, diarrhea. Still has  some dyspnea on exertion. No abdominal pain. No dysuria or hematuria  Objective: Filed Vitals:   01/01/13 1957 01/01/13 2046 01/02/13 0553 01/02/13 0752  BP:  139/87 163/67   Pulse:  84 66   Temp:  98.6 F (37 C) 97.4 F (36.3 C)   TempSrc:  Oral Oral   Resp:  18 19   Height:      Weight:   98.022 kg (216 lb 1.6 oz)   SpO2: 92% 97% 94% 96%    Intake/Output Summary (Last 24 hours) at 01/02/13 1128 Last data filed at 01/02/13 0900  Gross per 24 hour  Intake   1023 ml  Output   1900 ml  Net   -877 ml   Weight change: -2.767 kg (-6 lb 1.6 oz) Exam:   General:  Pt is alert, follows commands appropriately, not in acute distress  HEENT: No icterus, No thrush, [place]/AT  Cardiovascular: RRR, S1/S2, no rubs, no gallops  Respiratory: Bibasilar crackles, left greater than right. No wheezes. Good air movement.  Abdomen: Soft/+BS, non tender, non distended, no guarding  Extremities: 1+ edema, No lymphangitis, No petechiae, No rashes, no synovitis  Data Reviewed: Basic Metabolic Panel:  Recent Labs Lab 12/29/12 0448 12/30/12 0558 12/31/12 0430 01/01/13 0455 01/02/13 0455  NA 138 137 138 139 141  K 3.9 3.7 4.1 4.0 3.8  CL 99 102 104 103 102  CO2 28 27 27 26 30   GLUCOSE 175* 84 103* 181* 214*  BUN 28* 44* 53* 48* 37*  CREATININE 1.83* 2.41* 2.57* 2.14* 1.81*  CALCIUM 10.0 9.4 9.6 9.6 10.0   Liver Function Tests:  Recent Labs Lab 12/28/12 1747  AST 14  ALT 14  ALKPHOS 56  BILITOT 0.7  PROT 8.6*  ALBUMIN 4.0   No results found for this basename: LIPASE, AMYLASE,  in the last 168 hours No results found for this basename: AMMONIA,  in the last 168 hours CBC:  Recent Labs Lab 12/28/12 1747 12/29/12 0448 12/30/12 0558 12/31/12 0430 01/01/13 0900  WBC 15.0* 14.8* 10.8* 7.7 8.0  HGB 15.9 14.5 12.2* 12.3* 12.8*  HCT 45.2 41.3 35.6* 36.4* 37.8*  MCV 92.1 92.6 92.7 93.1 93.3  PLT 160 134* 132* 133* 132*   Cardiac Enzymes: No results found for this basename:  CKTOTAL, CKMB, CKMBINDEX, TROPONINI,  in the last 168 hours BNP: No components found with this basename: POCBNP,  CBG:  Recent Labs Lab 01/01/13 0603 01/01/13 1114 01/01/13 1549 01/01/13 2201 01/02/13 0627  GLUCAP 158* 186* 149* 155* 189*    Recent Results (from the past 240 hour(s))  URINE CULTURE     Status: None   Collection Time    12/28/12  6:52 PM      Result Value Range Status   Specimen Description URINE, CLEAN CATCH   Final   Special Requests NONE   Final   Culture  Setup Time 12/28/2012 19:56   Final   Colony Count NO GROWTH   Final   Culture NO GROWTH   Final   Report Status 12/29/2012 FINAL   Final  CULTURE, BLOOD (ROUTINE X 2)     Status: None   Collection Time    12/29/12 10:00 AM      Result Value Range Status   Specimen Description BLOOD LEFT ANTECUBITAL   Final   Special Requests BOTTLES DRAWN AEROBIC AND ANAEROBIC 10CC   Final  Culture  Setup Time 12/29/2012 16:46   Final   Culture     Final   Value:        BLOOD CULTURE RECEIVED NO GROWTH TO DATE CULTURE WILL BE HELD FOR 5 DAYS BEFORE ISSUING A FINAL NEGATIVE REPORT   Report Status PENDING   Incomplete  CULTURE, BLOOD (ROUTINE X 2)     Status: None   Collection Time    12/29/12 10:10 AM      Result Value Range Status   Specimen Description BLOOD RIGHT ANTECUBITAL   Final   Special Requests     Final   Value: BOTTLES DRAWN AEROBIC AND ANAEROBIC [place] AER 5CC ANA   Culture  Setup Time 12/29/2012 16:45   Final   Culture     Final   Value:        BLOOD CULTURE RECEIVED NO GROWTH TO DATE CULTURE WILL BE HELD FOR 5 DAYS BEFORE ISSUING A FINAL NEGATIVE REPORT   Report Status PENDING   Incomplete  URINE CULTURE     Status: None   Collection Time    12/29/12  4:50 PM      Result Value Range Status   Specimen Description URINE, RANDOM   Final   Special Requests NONE   Final   Culture  Setup Time 12/29/2012 17:24   Final   Colony Count NO GROWTH   Final   Culture NO GROWTH   Final   Report Status  12/30/2012 FINAL   Final     Scheduled Meds: . amLODipine  10 mg Oral Daily  . azithromycin  500 mg Intravenous Q24H  . budesonide-formoterol  2 puff Inhalation BID  . cefTRIAXone (ROCEPHIN)  IV  1 g Intravenous Q24H  . enoxaparin (LOVENOX) injection  30 mg Subcutaneous Q24H  . fenofibrate  54 mg Oral Daily  . fluticasone  1 spray Each Nare Daily  . furosemide  40 mg Intravenous Once  . hydrALAZINE  25 mg Oral Q8H  . insulin aspart  0-9 Units Subcutaneous TID WC  . metoprolol tartrate  25 mg Oral BID  . montelukast  10 mg Oral QHS  . PARoxetine  20 mg Oral BH-q7a  . sodium chloride  3 mL Intravenous Q12H  . traZODone  150 mg Oral QHS   Continuous Infusions:    Darrius Montano, DO  [facility] Pager [phone]  If 7PM-7AM, please contact night-coverage [email or web address] Password [email or web address] 01/02/2013, 11:28 AM   LOS: 5 days

## 2013-01-02 NOTE — Progress Notes (Signed)
CKD with recent exacerbation with renal function at baseline Will sign off. No new suggestions  Subjective: Interval History: Still SOB  Objective: Vital signs in last 24 hours: Temp:  [97.4 F (36.3 [name])-98.9 F (37.2 [name])] 97.4 F (36.3 [name]) (06/28 0553) Pulse Rate:  [66-84] 66 (06/28 0553) Resp:  [18-19] 19 (06/28 0553) BP: (139-163)/(67-87) 163/67 mmHg (06/28 0553) SpO2:  [92 %-97 %] 96 % (06/28 0752) Weight:  [98.022 kg (216 lb 1.6 oz)] 98.022 kg (216 lb 1.6 oz) (06/28 0553) Weight change: -2.767 kg (-6 lb 1.6 oz)  Intake/Output from previous day: 06/27 0701 - 06/28 0700 In: 1023 [P.O.:720; I.V.:3; IV Piggyback:300] Out: 1900 [Urine:1900] Intake/Output this shift: Total I/O In: 120 [P.O.:120] Out: -   General appearance: alert and cooperative Resp: diminished breath sounds bilaterally Chest wall: no tenderness Extremities: edema 1+  Lab Results:  Recent Labs  12/31/12 0430 01/01/13 0900  WBC 7.7 8.0  HGB 12.3* 12.8*  HCT 36.4* 37.8*  PLT 133* 132*   BMET:  Recent Labs  01/01/13 0455 01/02/13 0455  NA 139 141  K 4.0 3.8  CL 103 102  CO2 26 30  GLUCOSE 181* 214*  BUN 48* 37*  CREATININE 2.14* 1.81*  CALCIUM 9.6 10.0   No results found for this basename: PTH,  in the last 72 hours Iron Studies: No results found for this basename: IRON, TIBC, TRANSFERRIN, FERRITIN,  in the last 72 hours Studies/Results: Dg Chest 2 View  01/01/2013   *RADIOLOGY REPORT*  Clinical Data: 77 year old male with shortness of breath.  CHF, pneumonia.  CHEST - 2 VIEW  Comparison: 12/31/2012 and earlier.  Findings: Sitting AP and lateral views of the chest at 0832 hours. Continued left peripheral/perihilar airspace disease.  This does appear mildly regressed.  Small left pleural effusion. Stable cardiomegaly and mediastinal contours.  No overt pulmonary edema. No pneumothorax. No acute osseous abnormality identified.  IMPRESSION: 1.  Left perihilar/midlung airspace disease persists but  does appear mildly regressed.  Recommend post-treatment study to document full resolution (see chest CT report 12/29/2012). 2.  Small left pleural effusion.   Original Report Authenticated By: Roselyn Reef, M.D.   Dg Chest 2 View  12/31/2012   *RADIOLOGY REPORT*  Clinical Data: Pneumonia.  Shortness of breath.  CHEST - 2 VIEW  Comparison: Chest x-rays dated 12/28/2012 and a chest CT dated 12/29/2012  Findings: There has been appreciable progression of the consolidation in the left upper lobe peripherally with new atelectasis at the left lung base posteriorly.  Chronic cardiomegaly.  Slight pulmonary vascular prominence.  Right lung is clear.  No acute osseous abnormality.  IMPRESSION: Progression of left upper lobe pneumonia.  New atelectasis at the left lung base.   Original Report Authenticated By: Lorriane Shire, M.D.    Scheduled: . amLODipine  10 mg Oral Daily  . azithromycin  500 mg Intravenous Q24H  . budesonide-formoterol  2 puff Inhalation BID  . cefTRIAXone (ROCEPHIN)  IV  1 g Intravenous Q24H  . enoxaparin (LOVENOX) injection  30 mg Subcutaneous Q24H  . fenofibrate  54 mg Oral Daily  . fluticasone  1 spray Each Nare Daily  . furosemide  40 mg Intravenous Once  . hydrALAZINE  25 mg Oral Q8H  . insulin aspart  0-9 Units Subcutaneous TID WC  . metoprolol tartrate  25 mg Oral BID  . montelukast  10 mg Oral QHS  . PARoxetine  20 mg Oral BH-q7a  . sodium chloride  3 mL Intravenous Q12H  .  traZODone  150 mg Oral QHS     LOS: 5 days   Ian [name] 01/02/2013,12:33 PM

## 2013-01-03 DIAGNOSIS — I517 Cardiomegaly: Secondary | ICD-10-CM

## 2013-01-03 LAB — BASIC METABOLIC PANEL
BUN: 32 mg/dL — ABNORMAL HIGH (ref 6–23)
CO2: 33 mEq/L — ABNORMAL HIGH (ref 19–32)
Chloride: 98 mEq/L (ref 96–112)
Creatinine, Ser: 1.74 mg/dL — ABNORMAL HIGH (ref 0.50–1.35)

## 2013-01-03 LAB — GLUCOSE, CAPILLARY
Glucose-Capillary: 166 mg/dL — ABNORMAL HIGH (ref 70–99)
Glucose-Capillary: 197 mg/dL — ABNORMAL HIGH (ref 70–99)

## 2013-01-03 MED ORDER — FUROSEMIDE 40 MG PO TABS
40.0000 mg | ORAL_TABLET | Freq: Once | ORAL | Status: AC
  Administered 2013-01-03: 40 mg via ORAL
  Filled 2013-01-03: qty 1

## 2013-01-03 MED ORDER — CAMPHOR-MENTHOL 0.5-0.5 % EX LOTN
TOPICAL_LOTION | CUTANEOUS | Status: DC | PRN
  Administered 2013-01-03: 18:00:00 via TOPICAL
  Filled 2013-01-03: qty 222

## 2013-01-03 MED ORDER — METOPROLOL TARTRATE 50 MG PO TABS
50.0000 mg | ORAL_TABLET | Freq: Two times a day (BID) | ORAL | Status: DC
  Administered 2013-01-03 – 2013-01-04 (×2): 50 mg via ORAL
  Filled 2013-01-03 (×3): qty 1

## 2013-01-03 NOTE — Progress Notes (Signed)
1600 c/o itchiness at the back rashes sporadically spread . Pt claimed that been  Scratching with abrasive bristle hair brush given by wife . Seen and evaluated by Dr. Carles Collet with instructions to pt  And left with orders

## 2013-01-03 NOTE — Progress Notes (Signed)
  Echocardiogram 2D Echocardiogram has been performed.  Ian [name] 01/03/2013, 10:51 AM

## 2013-01-03 NOTE — Progress Notes (Signed)
[facility] PROGRESS NOTE  Ian [name] [id number] DOB: 1936/06/18 DOA: 12/28/2012 PCP: Hollace Kinnier, DO  Assessment/Plan: Community acquired pneumonia  -Continue ceftriaxone and azithromycin  -WBC improved  -Urine Legionella antigen, urine Streptococcus pneumonia antigen--neg  -CT chest shows irregular left upper lobe lingular opacity, likely pneumonia  -Discontinue antibiotics after today's doses as the patient has completed 7 days of therapy Hypoxemia/fluid overload  -Oxygen saturation and dyspnea improving with furosemide -Chest x-ray suggests vascular congestion  -ProBNP 788  -Furosemide 40 mg by mouth x1 (01/01/13)  -furosemide 40mg  IV x one --01/02/13  -echocardiogram--results are pending -Start furosemide 40 mg daily po Acute on chronic renal failure  -Gradually improving--serum creatinine has returned to baseline -suspect component of losartan and lisinopril with infection  -Renal ultrasound--no hydronephrosis  -appreciated nephrology  Diabetes mellitus with proteinuria  -Discontinue Amaryl, patient mildly hypoglycemic-->resolved  -Continue NovoLog sliding scale  -Hemoglobin A1c 6.7 -CBGs are controlled COPD  -Clinically stable  -Continue Symbicort  Hypertension  -Continue metoprolol tartrate and hydralazine  -Discontinue lisinopril and losartan  -Increase metoprolol tartrate 50 mg twice a day Hyperlipidemia  -Continue fenofibrate  Deconditioning  -Continue PT  Family Communication: Wife and son at beside  Disposition Plan: SNF when medically stable  Antibiotics:  Ceftriaxone 12/28/2012>>> 01/03/13 Azithromycin 12/28/2012>>> 01/03/13 Family Communication: Wife and son at beside  Disposition Plan: Home when medically stable         Procedures/Studies: Dg Chest 2 View  01/01/2013   *RADIOLOGY REPORT*  Clinical Data: 77 year old male with shortness of breath.  CHF, pneumonia.  CHEST - 2 VIEW  Comparison: 12/31/2012 and earlier.  Findings: Sitting AP  and lateral views of the chest at 0832 hours. Continued left peripheral/perihilar airspace disease.  This does appear mildly regressed.  Small left pleural effusion. Stable cardiomegaly and mediastinal contours.  No overt pulmonary edema. No pneumothorax. No acute osseous abnormality identified.  IMPRESSION: 1.  Left perihilar/midlung airspace disease persists but does appear mildly regressed.  Recommend post-treatment study to document full resolution (see chest CT report 12/29/2012). 2.  Small left pleural effusion.   Original Report Authenticated By: Roselyn Reef, M.D.   Dg Chest 2 View  12/31/2012   *RADIOLOGY REPORT*  Clinical Data: Pneumonia.  Shortness of breath.  CHEST - 2 VIEW  Comparison: Chest x-rays dated 12/28/2012 and a chest CT dated 12/29/2012  Findings: There has been appreciable progression of the consolidation in the left upper lobe peripherally with new atelectasis at the left lung base posteriorly.  Chronic cardiomegaly.  Slight pulmonary vascular prominence.  Right lung is clear.  No acute osseous abnormality.  IMPRESSION: Progression of left upper lobe pneumonia.  New atelectasis at the left lung base.   Original Report Authenticated By: Lorriane Shire, M.D.   Dg Chest 2 View  12/28/2012   *RADIOLOGY REPORT*  Clinical Data: Fever and cough  CHEST - 2 VIEW  Comparison: 08/12/2012  Findings: Two-view exam shows patchy left perihilar and basilar airspace disease.  No focal consolidation evident within the right lung.  No pulmonary edema. The cardiopericardial silhouette is enlarged.  No evidence for pleural effusion. Imaged bony structures of the thorax are intact.  IMPRESSION: Patchy airspace disease in the left perihilar region and left base suggests pneumonia.  Follow-up imaging is recommended to ensure complete resolution.   Original Report Authenticated By: Misty Stanley, M.D.   Ct Chest Wo Contrast  12/29/2012   *RADIOLOGY REPORT*  Clinical Data: Follow-up abnormal chest x-ray.  CT  CHEST WITHOUT CONTRAST  Technique:  Multidetector CT imaging of the chest was performed following the standard protocol without IV contrast.  Comparison: Chest x-ray 12/28/2012  Findings: Irregular airspace opacity noted in the lingula of the left upper lobe corresponding to the density seen on prior chest x- ray.  I suspect this represents pneumonia, but follow-up after treatment is warranted to exclude malignancy.  The remainder of the lungs are clear.  No pleural effusions.  Mild cardiomegaly. Scattered coronary artery and aortic calcifications.  No evidence of aortic aneurysm. No mediastinal, hilar, or axillary adenopathy. Visualized thyroid and chest wall soft tissues unremarkable.  Imaging into the upper abdomen shows no acute findings.  Small layering gallstones partially imaged within the gallbladder.  On no acute bony abnormality.  IMPRESSION: Irregular airspace density within the lingula corresponding to the abnormality on chest x-ray.  I favor this represents pneumonia, recommend follow up after treatment to assure resolution and exclude malignancy.  Cardiomegaly, coronary artery disease.  Cholelithiasis.   Original Report Authenticated By: [name], M.D.   US Renal  12/30/2012   *RADIOLOGY REPORT*  Clinical Data: 77 year old male with acute renal failure.  RENAL/URINARY TRACT ULTRASOUND COMPLETE  Comparison:  10/13/2009.  Findings:  Right Kidney:  No hydronephrosis.  Renal length 11.4 cm.  Cortical echotexture stable within normal limits.  Unchanged small upper pole simple appearing cyst.  Left Kidney:  No hydronephrosis.  Renal length 10.7 cm.  Cortical echotexture within normal limits.  Bladder:  Unremarkable.  IMPRESSION: Stable and negative sonographic appearance of the kidneys and bladder.   Original Report Authenticated By: Roselyn Reef, M.D.   Dg Chest Port 1 View  12/28/2012   *RADIOLOGY REPORT*  Clinical Data: Fever, former smoking history, diabetes  PORTABLE CHEST - 1 VIEW  Comparison:  Chest x-ray of 12/28/2012 and 08/12/2012  Findings: The rounded mass-like opacity in the left perihilar region is again noted and may represent pneumonia.  However malignancy cannot be excluded and follow-up chest x-ray or CT of the chest is recommended.  There is cardiomegaly present and there is a question of very mild pulmonary vascular congestion as well. No acute bony abnormality is seen.  IMPRESSION:  1.  Rounded opacity in the left perihilar region may represent pneumonia but a malignancy cannot be excluded.  Recommend follow-up chest x-ray and possible CT of the chest. 2.  Cardiomegaly.  Minimal pulmonary vascular congestion.   Original Report Authenticated By: Ivar Drape, M.D.         Subjective: Patient denies fevers, chills, chest discomfort, nausea, vomiting, diarrhea, abdominal pain, dysuria. He states that his breathing has improved.  Objective: Filed Vitals:   01/03/13 0534 01/03/13 0719 01/03/13 0730 01/03/13 0745  BP: 142/67  144/72   Pulse: 76  79   Temp: 98 F (36.7 C)     TempSrc: Oral     Resp: 18     Height:      Weight: 98.4 kg (216 lb 14.9 oz)     SpO2: 95% 94% 92% 94%    Intake/Output Summary (Last 24 hours) at 01/03/13 1150 Last data filed at 01/03/13 1000  Gross per 24 hour  Intake    750 ml  Output   1575 ml  Net   -825 ml   Weight change: 0.378 kg (13.3 oz) Exam:   General:  Pt is alert, follows commands appropriately, not in acute distress  HEENT: No icterus, No thrush,[place]/AT  Cardiovascular: RRR, S1/S2, no rubs, no gallops  Respiratory: Bibasilar crackles, left greater than right.  No wheezing. Good air movement.  Abdomen: Soft/+BS, non tender, non distended, no guarding  Extremities: 1+ edema, No lymphangitis, No petechiae, No rashes, no synovitis  Data Reviewed: Basic Metabolic Panel:  Recent Labs Lab 12/30/12 0558 12/31/12 0430 01/01/13 0455 01/02/13 0455 01/03/13 0615  NA 137 138 139 141 137  K 3.7 4.1 4.0 3.8 4.2  CL 102  104 103 102 98  CO2 27 27 26 30  33*  GLUCOSE 84 103* 181* 214* 172*  BUN 44* 53* 48* 37* 32*  CREATININE 2.41* 2.57* 2.14* 1.81* 1.74*  CALCIUM 9.4 9.6 9.6 10.0 9.9   Liver Function Tests:  Recent Labs Lab 12/28/12 1747  AST 14  ALT 14  ALKPHOS 56  BILITOT 0.7  PROT 8.6*  ALBUMIN 4.0   No results found for this basename: LIPASE, AMYLASE,  in the last 168 hours No results found for this basename: AMMONIA,  in the last 168 hours CBC:  Recent Labs Lab 12/28/12 1747 12/29/12 0448 12/30/12 0558 12/31/12 0430 01/01/13 0900  WBC 15.0* 14.8* 10.8* 7.7 8.0  HGB 15.9 14.5 12.2* 12.3* 12.8*  HCT 45.2 41.3 35.6* 36.4* 37.8*  MCV 92.1 92.6 92.7 93.1 93.3  PLT 160 134* 132* 133* 132*   Cardiac Enzymes: No results found for this basename: CKTOTAL, CKMB, CKMBINDEX, TROPONINI,  in the last 168 hours BNP: No components found with this basename: POCBNP,  CBG:  Recent Labs Lab 01/02/13 0627 01/02/13 1128 01/02/13 1633 01/02/13 2123 01/03/13 0608  GLUCAP 189* 165* 173* 168* 166*    Recent Results (from the past 240 hour(s))  URINE CULTURE     Status: None   Collection Time    12/28/12  6:52 PM      Result Value Range Status   Specimen Description URINE, CLEAN CATCH   Final   Special Requests NONE   Final   Culture  Setup Time 12/28/2012 19:56   Final   Colony Count NO GROWTH   Final   Culture NO GROWTH   Final   Report Status 12/29/2012 FINAL   Final  CULTURE, BLOOD (ROUTINE X 2)     Status: None   Collection Time    12/29/12 10:00 AM      Result Value Range Status   Specimen Description BLOOD LEFT ANTECUBITAL   Final   Special Requests BOTTLES DRAWN AEROBIC AND ANAEROBIC 10CC   Final   Culture  Setup Time 12/29/2012 16:46   Final   Culture     Final   Value:        BLOOD CULTURE RECEIVED NO GROWTH TO DATE CULTURE WILL BE HELD FOR 5 DAYS BEFORE ISSUING A FINAL NEGATIVE REPORT   Report Status PENDING   Incomplete  CULTURE, BLOOD (ROUTINE X 2)     Status: None    Collection Time    12/29/12 10:10 AM      Result Value Range Status   Specimen Description BLOOD RIGHT ANTECUBITAL   Final   Special Requests     Final   Value: BOTTLES DRAWN AEROBIC AND ANAEROBIC [place] AER 5CC ANA   Culture  Setup Time 12/29/2012 16:45   Final   Culture     Final   Value:        BLOOD CULTURE RECEIVED NO GROWTH TO DATE CULTURE WILL BE HELD FOR 5 DAYS BEFORE ISSUING A FINAL NEGATIVE REPORT   Report Status PENDING   Incomplete  URINE CULTURE     Status: None   Collection Time  12/29/12  4:50 PM      Result Value Range Status   Specimen Description URINE, RANDOM   Final   Special Requests NONE   Final   Culture  Setup Time 12/29/2012 17:24   Final   Colony Count NO GROWTH   Final   Culture NO GROWTH   Final   Report Status 12/30/2012 FINAL   Final     Scheduled Meds: . amLODipine  10 mg Oral Daily  . azithromycin  500 mg Intravenous Q24H  . budesonide-formoterol  2 puff Inhalation BID  . cefTRIAXone (ROCEPHIN)  IV  1 g Intravenous Q24H  . enoxaparin (LOVENOX) injection  40 mg Subcutaneous Q24H  . fenofibrate  54 mg Oral Daily  . fluticasone  1 spray Each Nare Daily  . furosemide  40 mg Oral Once  . hydrALAZINE  25 mg Oral Q8H  . insulin aspart  0-9 Units Subcutaneous TID WC  . metoprolol tartrate  25 mg Oral BID  . montelukast  10 mg Oral QHS  . PARoxetine  20 mg Oral BH-q7a  . sodium chloride  3 mL Intravenous Q12H  . traZODone  150 mg Oral QHS   Continuous Infusions:    Arrow Tomko, DO  [facility] Pager [phone]  If 7PM-7AM, please contact night-coverage [email or web address] Password [facility] 01/03/2013, 11:50 AM   LOS: 6 days

## 2013-01-04 LAB — BASIC METABOLIC PANEL
BUN: 38 mg/dL — ABNORMAL HIGH (ref 6–23)
Chloride: 100 mEq/L (ref 96–112)
GFR calc Af Amer: 40 mL/min — ABNORMAL LOW (ref >90)
Glucose, Bld: 173 mg/dL — ABNORMAL HIGH (ref 70–99)
Potassium: 4 mEq/L (ref 3.5–5.1)

## 2013-01-04 LAB — GLUCOSE, CAPILLARY
Glucose-Capillary: 163 mg/dL — ABNORMAL HIGH (ref 70–99)
Glucose-Capillary: 150 mg/dL — ABNORMAL HIGH (ref 70–99)

## 2013-01-04 LAB — CULTURE, BLOOD (ROUTINE X 2): Culture: NO GROWTH

## 2013-01-04 MED ORDER — METOPROLOL TARTRATE 50 MG PO TABS: 50.0000 mg | ORAL_TABLET | Freq: Two times a day (BID) | ORAL | Status: DC

## 2013-01-04 MED ORDER — GLIPIZIDE 2.5 MG HALF TABLET
2.5000 mg | ORAL_TABLET | Freq: Every day | ORAL | Status: DC
  Filled 2013-01-04: qty 1

## 2013-01-04 MED ORDER — CAMPHOR-MENTHOL 0.5-0.5 % EX LOTN: TOPICAL_LOTION | CUTANEOUS | Status: DC | PRN

## 2013-01-04 MED ORDER — GLIPIZIDE 2.5 MG HALF TABLET: 2.5000 mg | ORAL_TABLET | Freq: Every day | ORAL | Status: DC

## 2013-01-04 MED ORDER — HYDRALAZINE HCL 25 MG PO TABS: 25.0000 mg | ORAL_TABLET | Freq: Three times a day (TID) | ORAL | Status: DC

## 2013-01-04 MED ORDER — FUROSEMIDE 20 MG PO TABS: 20.0000 mg | ORAL_TABLET | Freq: Every day | ORAL | Status: DC

## 2013-01-04 MED ORDER — FUROSEMIDE 20 MG PO TABS
20.0000 mg | ORAL_TABLET | Freq: Every day | ORAL | Status: DC
  Administered 2013-01-04: 20 mg via ORAL
  Filled 2013-01-04: qty 1

## 2013-01-04 NOTE — Discharge Summary (Signed)
Physician Discharge Summary  Mahmood Sokolow [id number] DOB: Feb 14, 1936 DOA: 12/28/2012  PCP: Hollace Kinnier, DO  Admit date: 12/28/2012 Discharge date: 01/04/2013  Recommendations for Outpatient Follow-up:  1. Pt will need to follow up with PCP in 2 weeks post discharge 2. Please obtain BMP to evaluate electrolytes and kidney function in 3 days and adjust the patient's furosemide dose accordingly   Discharge Diagnoses:  Principal Problem:   Pneumonia Active Problems:   Hypertension   COPD (chronic obstructive pulmonary disease)   UTI (lower urinary tract infection)   Diabetes mellitus   Fluid overload Community acquired pneumonia  -Continue ceftriaxone and azithromycin-the patient completed 7 days of antibiotics -WBC improved  -Urine Legionella antigen, urine Streptococcus pneumonia antigen--neg  -CT chest shows irregular left upper lobe lingular opacity, likely pneumonia  Hypoxemia/fluid overload  -Oxygen saturation has been gradually decreasing, as it can increase to 4 L  -Chest x-ray suggests vascular congestion  -ProBNP 788  -Furosemide 40 mg by mouth x1 (01/01/13)  -furosemide 40mg  IV x one --01/02/13  -The patient will be discharged with furosemide 20 mg po daily -echocardiogram--EF [id number], grade 1 diastolic dysfunction, no wall motion abnormality Acute on chronic renal failure  -Gradually improving/stable -Serum creatinine 1.3 on the day of discharge -suspect component of losartan and lisinopril with infection  -He will not be restarted on losartan or lisinopril -Renal ultrasound--no hydronephrosis  -appreciated nephrology  Diabetes mellitus with proteinuria  -Discontinue Amaryl, patient mildly hypoglycemic-->resolved  -Continue NovoLog sliding scale  -The patient was started on glipizide rather than Amaryl as his renal insufficiency in combination with his Amaryl may have led to hypoglycemia -He'll be discharged with glipizide 2.5 mg daily -He'll followup with his  primary care provider who can make further judgments to his diabetic regimen -Hemoglobin A1c 6.7 COPD  -Clinically stable  -Continue Symbicort  Hypertension  -Continue metoprolol tartrate and hydralazine  -Discontinue lisinopril and losartan  -His metoprolol tartrate dose was increased to 50 mg twice a day Hyperlipidemia  -Continue fenofibrate  Deconditioning  -Continue PT  Family Communication: Wife and son at beside  Disposition Plan: SNF Antibiotics:  Ceftriaxone 12/28/2012>>> 01/03/2013 Azithromycin 12/28/2012>>> 01/03/2013   Discharge Condition: stable  Disposition: SNF  Diet: carb modified Wt Readings from Last 3 Encounters:  01/04/13 96.9 kg (213 lb 10 oz)  11/26/12 100.699 kg (222 lb)  11/10/12 100.154 kg (220 lb 12.8 oz)     Hospital Course:  77 year old male with history of diabetes mellitus type 2, hypertension, COPD, hyperlipidemia presented to the emergency department on 12/29/2012 with worsening cough.. Patient also had mild fever. Patient was found to be confused and had some difficulty walking due to worsening dyspnea.In the ER patient was found to be febrile and blood work showed leukocytosis and chest x-ray showed left sided opacity concerning for pneumonia. Patient has been started on antibiotics for pneumonia. Patient's wife states that last month patient had lot of postnasal drip and cough and was prescribed antibiotics but had not taken it. The patient was started on ceftriaxone and azithromycin. The patient presenting serum creatinine was 1.82. His only prior labs were performed in April 2011 which revealed a serum creatinine of 1.22 at that time. The patient's serum creatinine worsened through the hospitalization. He was thought to be due to the patient's pneumonia as well as his home use of lisinopril and losartan. The patient was given fluids. Nephrology was consulted. Renal ultrasound was negative for hydronephrosis. The patient's serum creatinine gradually  improved without additional intervention. Unfortunately, the  patient became fluid overloaded. The patient was started on IV furosemide. He was transitioned to oral furosemide. The patient diuresed well. His respiratory status improved. The patient will be discharged on furosemide 20 mg daily PO. Echocardiogram was ordered and showed ejection fraction 60-65% with grade 1 diastolic dysfunction. There was no regional wall abnormalities.    Discharge Exam: Filed Vitals:   01/04/13 0958  BP: 140/51  Pulse: 68  Temp:   Resp:    Filed Vitals:   01/03/13 2034 01/03/13 2122 01/04/13 0502 01/04/13 0958  BP: 143/58  144/55 140/51  Pulse: 72  70 68  Temp: 98 F (36.7 C)  98.2 F (36.8 C)   TempSrc: Oral  Oral   Resp: 18  18   Height:      Weight:   96.9 kg (213 lb 10 oz)   SpO2: 95% 94% 93% 94%   General: A&O x 3, NAD, pleasant, cooperative Cardiovascular: RRR, no rub, no gallop, no S3 Respiratory: CTAB, no wheeze, no rhonchi Abdomen:soft, nontender, nondistended, positive bowel sounds Extremities: No edema, No lymphangitis, no petechiae  Discharge Instructions      Discharge Orders   Future Appointments Provider Department Dept Phone   02/26/2013 11:15 AM [name] Lynelle Doctor, DO [facility] [phone]   Future Orders Complete By Expires     Diet - low sodium heart healthy  As directed     Increase activity slowly  As directed         Medication List    STOP taking these medications       glimepiride 4 MG tablet  Commonly known as:  AMARYL     lisinopril 5 MG tablet  Commonly known as:  PRINIVIL,ZESTRIL     losartan 25 MG tablet  Commonly known as:  COZAAR      TAKE these medications       albuterol 108 (90 BASE) MCG/ACT inhaler  Commonly known as:  PROVENTIL HFA;VENTOLIN HFA  Inhale 2 puffs into the lungs every 6 (six) hours as needed for wheezing or shortness of breath.     amLODipine 10 MG tablet  Commonly known as:  NORVASC  Take 10 mg by mouth daily.      budesonide-formoterol 80-4.5 MCG/ACT inhaler  Commonly known as:  SYMBICORT  Inhale 2 puffs into the lungs 2 (two) times daily.     camphor-menthol lotion  Commonly known as:  SARNA  Apply topically as needed for itching. Apply to back and trunk     CLARITIN 10 MG tablet  Generic drug:  loratadine  Take 10 mg by mouth daily as needed for allergies.     Fenofibric Acid 35 MG Tabs  Take 35 mg by mouth every evening.     furosemide 20 MG tablet  Commonly known as:  LASIX  Take 1 tablet (20 mg total) by mouth daily.     glipiZIDE 2.5 mg Tabs  Commonly known as:  GLUCOTROL  Take 0.5 tablets (2.5 mg total) by mouth daily before breakfast.  Start taking on:  01/05/2013     glucose blood test strip  Commonly known as:  BAYER CONTOUR TEST  Use as directed to check blood sugar to control diabetes. 250.00     glucose blood test strip  Use as instructed once daily to help control blood sugar.     hydrALAZINE 25 MG tablet  Commonly known as:  APRESOLINE  Take 1 tablet (25 mg total) by mouth every 8 (eight) hours.  metoprolol 50 MG tablet  Commonly known as:  LOPRESSOR  Take 1 tablet (50 mg total) by mouth 2 (two) times daily.     mometasone 50 MCG/ACT nasal spray  Commonly known as:  NASONEX  Place 2 sprays into the nose daily.     montelukast 10 MG tablet  Commonly known as:  SINGULAIR  Take 1 tablet (10 mg total) by mouth at bedtime.     ONE TOUCH ULTRA SYSTEM KIT W/DEVICE Kit  1 kit by Does not apply route once. Use as Directed. DX: 250.02     PARoxetine 20 MG tablet  Commonly known as:  PAXIL  Take 20 mg by mouth every morning.     traZODone 150 MG tablet  Commonly known as:  DESYREL  Take 150 mg by mouth at bedtime.         The results of significant diagnostics from this hospitalization (including imaging, microbiology, ancillary and laboratory) are listed below for reference.    Significant Diagnostic Studies: Dg Chest 2 View  01/01/2013   *RADIOLOGY  REPORT*  Clinical Data: 77 year old male with shortness of breath.  CHF, pneumonia.  CHEST - 2 VIEW  Comparison: 12/31/2012 and earlier.  Findings: Sitting AP and lateral views of the chest at 0832 hours. Continued left peripheral/perihilar airspace disease.  This does appear mildly regressed.  Small left pleural effusion. Stable cardiomegaly and mediastinal contours.  No overt pulmonary edema. No pneumothorax. No acute osseous abnormality identified.  IMPRESSION: 1.  Left perihilar/midlung airspace disease persists but does appear mildly regressed.  Recommend post-treatment study to document full resolution (see chest CT report 12/29/2012). 2.  Small left pleural effusion.   Original Report Authenticated By: Roselyn Reef, M.D.   Dg Chest 2 View  12/31/2012   *RADIOLOGY REPORT*  Clinical Data: Pneumonia.  Shortness of breath.  CHEST - 2 VIEW  Comparison: Chest x-rays dated 12/28/2012 and a chest CT dated 12/29/2012  Findings: There has been appreciable progression of the consolidation in the left upper lobe peripherally with new atelectasis at the left lung base posteriorly.  Chronic cardiomegaly.  Slight pulmonary vascular prominence.  Right lung is clear.  No acute osseous abnormality.  IMPRESSION: Progression of left upper lobe pneumonia.  New atelectasis at the left lung base.   Original Report Authenticated By: Lorriane Shire, M.D.   Dg Chest 2 View  12/28/2012   *RADIOLOGY REPORT*  Clinical Data: Fever and cough  CHEST - 2 VIEW  Comparison: 08/12/2012  Findings: Two-view exam shows patchy left perihilar and basilar airspace disease.  No focal consolidation evident within the right lung.  No pulmonary edema. The cardiopericardial silhouette is enlarged.  No evidence for pleural effusion. Imaged bony structures of the thorax are intact.  IMPRESSION: Patchy airspace disease in the left perihilar region and left base suggests pneumonia.  Follow-up imaging is recommended to ensure complete resolution.    Original Report Authenticated By: Misty Stanley, M.D.   Ct Chest Wo Contrast  12/29/2012   *RADIOLOGY REPORT*  Clinical Data: Follow-up abnormal chest x-ray.  CT CHEST WITHOUT CONTRAST  Technique:  Multidetector CT imaging of the chest was performed following the standard protocol without IV contrast.  Comparison: Chest x-ray 12/28/2012  Findings: Irregular airspace opacity noted in the lingula of the left upper lobe corresponding to the density seen on prior chest x- ray.  I suspect this represents pneumonia, but follow-up after treatment is warranted to exclude malignancy.  The remainder of the lungs are clear.  No  pleural effusions.  Mild cardiomegaly. Scattered coronary artery and aortic calcifications.  No evidence of aortic aneurysm. No mediastinal, hilar, or axillary adenopathy. Visualized thyroid and chest wall soft tissues unremarkable.  Imaging into the upper abdomen shows no acute findings.  Small layering gallstones partially imaged within the gallbladder.  On no acute bony abnormality.  IMPRESSION: Irregular airspace density within the lingula corresponding to the abnormality on chest x-ray.  I favor this represents pneumonia, recommend follow up after treatment to assure resolution and exclude malignancy.  Cardiomegaly, coronary artery disease.  Cholelithiasis.   Original Report Authenticated By: [name], M.D.   US Renal  12/30/2012   *RADIOLOGY REPORT*  Clinical Data: 77 year old male with acute renal failure.  RENAL/URINARY TRACT ULTRASOUND COMPLETE  Comparison:  10/13/2009.  Findings:  Right Kidney:  No hydronephrosis.  Renal length 11.4 cm.  Cortical echotexture stable within normal limits.  Unchanged small upper pole simple appearing cyst.  Left Kidney:  No hydronephrosis.  Renal length 10.7 cm.  Cortical echotexture within normal limits.  Bladder:  Unremarkable.  IMPRESSION: Stable and negative sonographic appearance of the kidneys and bladder.   Original Report Authenticated By: Roselyn Reef, M.D.   Dg Chest Port 1 View  12/28/2012   *RADIOLOGY REPORT*  Clinical Data: Fever, former smoking history, diabetes  PORTABLE CHEST - 1 VIEW  Comparison: Chest x-ray of 12/28/2012 and 08/12/2012  Findings: The rounded mass-like opacity in the left perihilar region is again noted and may represent pneumonia.  However malignancy cannot be excluded and follow-up chest x-ray or CT of the chest is recommended.  There is cardiomegaly present and there is a question of very mild pulmonary vascular congestion as well. No acute bony abnormality is seen.  IMPRESSION:  1.  Rounded opacity in the left perihilar region may represent pneumonia but a malignancy cannot be excluded.  Recommend follow-up chest x-ray and possible CT of the chest. 2.  Cardiomegaly.  Minimal pulmonary vascular congestion.   Original Report Authenticated By: Ivar Drape, M.D.     Microbiology: Recent Results (from the past 240 hour(s))  URINE CULTURE     Status: None   Collection Time    12/28/12  6:52 PM      Result Value Range Status   Specimen Description URINE, CLEAN CATCH   Final   Special Requests NONE   Final   Culture  Setup Time 12/28/2012 19:56   Final   Colony Count NO GROWTH   Final   Culture NO GROWTH   Final   Report Status 12/29/2012 FINAL   Final  CULTURE, BLOOD (ROUTINE X 2)     Status: None   Collection Time    12/29/12 10:00 AM      Result Value Range Status   Specimen Description BLOOD LEFT ANTECUBITAL   Final   Special Requests BOTTLES DRAWN AEROBIC AND ANAEROBIC 10CC   Final   Culture  Setup Time 12/29/2012 16:46   Final   Culture NO GROWTH 5 DAYS   Final   Report Status 01/04/2013 FINAL   Final  CULTURE, BLOOD (ROUTINE X 2)     Status: None   Collection Time    12/29/12 10:10 AM      Result Value Range Status   Specimen Description BLOOD RIGHT ANTECUBITAL   Final   Special Requests     Final   Value: BOTTLES DRAWN AEROBIC AND ANAEROBIC [place] AER 5CC ANA   Culture  Setup Time 12/29/2012 16:45  Final   Culture NO GROWTH 5 DAYS   Final   Report Status 01/04/2013 FINAL   Final  URINE CULTURE     Status: None   Collection Time    12/29/12  4:50 PM      Result Value Range Status   Specimen Description URINE, RANDOM   Final   Special Requests NONE   Final   Culture  Setup Time 12/29/2012 17:24   Final   Colony Count NO GROWTH   Final   Culture NO GROWTH   Final   Report Status 12/30/2012 FINAL   Final     Labs: Basic Metabolic Panel:  Recent Labs Lab 12/31/12 0430 01/01/13 0455 01/02/13 0455 01/03/13 0615 01/04/13 0600  NA 138 139 141 137 139  K 4.1 4.0 3.8 4.2 4.0  CL 104 103 102 98 100  CO2 27 26 30  33* 34*  GLUCOSE 103* 181* 214* 172* 173*  BUN 53* 48* 37* 32* 38*  CREATININE 2.57* 2.14* 1.81* 1.74* 1.83*  CALCIUM 9.6 9.6 10.0 9.9 9.8   Liver Function Tests:  Recent Labs Lab 12/28/12 1747  AST 14  ALT 14  ALKPHOS 56  BILITOT 0.7  PROT 8.6*  ALBUMIN 4.0   No results found for this basename: LIPASE, AMYLASE,  in the last 168 hours No results found for this basename: AMMONIA,  in the last 168 hours CBC:  Recent Labs Lab 12/28/12 1747 12/29/12 0448 12/30/12 0558 12/31/12 0430 01/01/13 0900  WBC 15.0* 14.8* 10.8* 7.7 8.0  HGB 15.9 14.5 12.2* 12.3* 12.8*  HCT 45.2 41.3 35.6* 36.4* 37.8*  MCV 92.1 92.6 92.7 93.1 93.3  PLT 160 134* 132* 133* 132*   Cardiac Enzymes: No results found for this basename: CKTOTAL, CKMB, CKMBINDEX, TROPONINI,  in the last 168 hours BNP: No components found with this basename: POCBNP,  CBG:  Recent Labs Lab 01/03/13 1109 01/03/13 1623 01/03/13 2107 01/04/13 0644 01/04/13 1132  GLUCAP 197* 118* 163* 166* 149*    Time coordinating discharge:  Greater than 30 minutes  Signed:  Isley Weisheit, DO [facility] Pager: [phone] 01/04/2013, 12:03 PM

## 2013-01-04 NOTE — Progress Notes (Signed)
Placed and report given to Jimmie Molly, Nurse from [place] @0525  pm Transported to [employer] by [employer] @534  pm

## 2013-01-04 NOTE — Progress Notes (Signed)
Ian [name], [place] [phone] 01/04/2013

## 2013-01-04 NOTE — Progress Notes (Signed)
Physical Therapy Treatment Patient Details Name: Husam Tzintzun MRN: [id number] DOB: 1935-12-13 Today's Date: 01/04/2013 Time: 1152-1208 PT Time Calculation (min): 16 min  PT Assessment / Plan / Recommendation  PT Comments   Pt is ambulating with less assistance and no LOB noted using a RW. He is still ambulating on 2L supplemental 02 to maintain sats>90%. He will benefit from continued PT in a SNF in order to increase his tolerance to exercise and improve his functional ability.   Follow Up Recommendations  SNF     Does the patient have the potential to tolerate intense rehabilitation     Barriers to Discharge        Equipment Recommendations  Rolling walker with 5" wheels    Recommendations for Other Services    Frequency Min 2X/week   Progress towards PT Goals Progress towards PT goals: Progressing toward goals  Plan Discharge plan needs to be updated;Frequency needs to be updated    Precautions / Restrictions Precautions Precautions: Fall Precaution Comments: monitor O2 sats Restrictions Weight Bearing Restrictions: No   Pertinent Vitals/Pain Patient made no reports of pain or respiratory distress.    Mobility  Transfers Transfers: Sit to Stand;Stand to Sit Sit to Stand: 4: Min guard;With upper extremity assist;With armrests;From chair/3-in-1 Stand to Sit: 4: Min guard;With upper extremity assist;With armrests;To chair/3-in-1 Details for Transfer Assistance: Pt required VC for correct hand placement and to control descent. Ambulation/Gait Ambulation/Gait Assistance: 4: Min guard Ambulation Distance (Feet): 80 Feet Assistive device: Rolling walker Ambulation/Gait Assistance Details: Pt required min VC for proper use of the RW. Pt educated on importance of pacing when walking. He required seated rest break x 1 due to fatigue. Gait Pattern: Step-through pattern;Trunk flexed;Shuffle Gait velocity: too fast for safety    Exercises General Exercises - Lower Extremity Long  Arc Quad: AROM;Both;10 reps;Seated Straight Leg Raises: AROM;Both;10 reps;Seated Hip Flexion/Marching: AROM;Both;10 reps;Seated Toe Raises: AROM;Both;10 reps;Seated Heel Raises: AROM;Both;10 reps;Seated        PT Goals (current goals can now be found in the care plan section)    Visit Information  Last PT Received On: 01/04/13 Assistance Needed: +1 History of Present Illness: 77 y.o. male admitted to [facility] for fever and PNA.  He is currently requiring supplemental O2 to maintain sats >90%.      Subjective Data  Subjective: Pt recieved sitting in chair agreeable to PT.   Cognition  Cognition Arousal/Alertness: Awake/alert Behavior During Therapy: WFL for tasks assessed/performed Overall Cognitive Status: Within Functional Limits for tasks assessed    Balance     End of Session PT - End of Session Equipment Utilized During Treatment: Gait belt;Oxygen Activity Tolerance: Patient limited by fatigue Patient left: in chair;with call bell/phone within reach Nurse Communication: Mobility status   GP     Gaynelle Cage, [place] 01/04/2013, 12:24 PM

## 2013-01-07 ENCOUNTER — Non-Acute Institutional Stay (SKILLED_NURSING_FACILITY): Payer: PRIVATE HEALTH INSURANCE | Admitting: Adult Health

## 2013-01-07 ENCOUNTER — Encounter: Payer: Self-pay | Admitting: Adult Health

## 2013-01-07 DIAGNOSIS — G4700 Insomnia, unspecified: Secondary | ICD-10-CM

## 2013-01-07 DIAGNOSIS — R600 Localized edema: Secondary | ICD-10-CM

## 2013-01-07 DIAGNOSIS — E785 Hyperlipidemia, unspecified: Secondary | ICD-10-CM

## 2013-01-07 DIAGNOSIS — F329 Major depressive disorder, single episode, unspecified: Secondary | ICD-10-CM

## 2013-01-07 DIAGNOSIS — J449 Chronic obstructive pulmonary disease, unspecified: Secondary | ICD-10-CM

## 2013-01-07 DIAGNOSIS — R609 Edema, unspecified: Secondary | ICD-10-CM

## 2013-01-07 DIAGNOSIS — J4489 Other specified chronic obstructive pulmonary disease: Secondary | ICD-10-CM

## 2013-01-07 DIAGNOSIS — E119 Type 2 diabetes mellitus without complications: Secondary | ICD-10-CM

## 2013-01-07 DIAGNOSIS — J189 Pneumonia, unspecified organism: Secondary | ICD-10-CM

## 2013-01-07 DIAGNOSIS — I10 Essential (primary) hypertension: Secondary | ICD-10-CM

## 2013-01-07 DIAGNOSIS — J309 Allergic rhinitis, unspecified: Secondary | ICD-10-CM

## 2013-01-07 DIAGNOSIS — J302 Other seasonal allergic rhinitis: Secondary | ICD-10-CM

## 2013-01-07 DIAGNOSIS — F3289 Other specified depressive episodes: Secondary | ICD-10-CM

## 2013-01-07 NOTE — Progress Notes (Signed)
  Subjective:    Patient ID: Ian [name], male    DOB: 1936/04/04, 77 y.o.   MRN: [id number]  HPI This is a 77 year old male who has been admitted to [facility] on 01/04/13 from [facility] with principal discharge diagnosis of Pneumonia. He has been admitted for a short-term rehabilitation. Noted to have redness and edema, 2+, on left hand. It is tender to to touch but not warm.   Review of Systems  Constitutional: Negative.   HENT: Negative.   Eyes: Negative.   Respiratory: Negative for cough, shortness of breath and wheezing.   Cardiovascular: Negative for chest pain and leg swelling.  Gastrointestinal: Negative for abdominal distention.  Endocrine: Negative.   Genitourinary: Negative.   Neurological: Negative.   Hematological: Negative for adenopathy. Does not bruise/bleed easily.  Psychiatric/Behavioral: Negative.        Objective:   Physical Exam  Nursing note and vitals reviewed. Constitutional: He is oriented to person, place, and time. He appears well-developed and well-nourished.  HENT:  Head: Normocephalic and atraumatic.  Right Ear: External ear normal.  Left Ear: External ear normal.  Mouth/Throat: Oropharynx is clear and moist.  Eyes: Conjunctivae and EOM are normal. Pupils are equal, round, and reactive to light.  Neck: Normal range of motion. Neck supple.  Cardiovascular: Normal rate, regular rhythm and normal heart sounds.   Pulmonary/Chest: Effort normal and breath sounds normal.  Abdominal: Soft. Bowel sounds are normal.  Musculoskeletal: Normal range of motion. He exhibits edema and tenderness.  Left hand has a rash and edema, 2+ and tender to touch  Neurological: He is alert and oriented to person, place, and time.  Skin: Skin is warm and dry.  Psychiatric: He has a normal mood and affect. His behavior is normal. Judgment and thought content normal.    [name]: 01/04/13  NA 139  K 4.0 glucose 173 BUN 38  Creatinine 1.83  Calcium 9.8 01/01/13  Wbc  8.0  hgb 12.8  hct 37.8     Medications reviewed      Assessment & Plan:   Edema of hand - pocket doppler ultrasound of left upper extremity; warm compress to left hand x 20 minutes Q shift x 3 days; Apply triple antibiotic ointment to left hand rash Q D x 1 week  Depression - stable  Insomnia - stable  Pneumonia - resolved  Diabetes mellitus - well-controlled  Anxiety - stable  Hypertension - well-controlled  COPD (chronic obstructive pulmonary disease) - stable  Hyperlipidemia - stable  Seasonal allergic rhinitis - stable

## 2013-01-13 ENCOUNTER — Non-Acute Institutional Stay (SKILLED_NURSING_FACILITY): Payer: PRIVATE HEALTH INSURANCE | Admitting: Adult Health

## 2013-01-13 DIAGNOSIS — J309 Allergic rhinitis, unspecified: Secondary | ICD-10-CM

## 2013-01-13 DIAGNOSIS — F329 Major depressive disorder, single episode, unspecified: Secondary | ICD-10-CM

## 2013-01-13 DIAGNOSIS — F32A Depression, unspecified: Secondary | ICD-10-CM

## 2013-01-13 DIAGNOSIS — L0291 Cutaneous abscess, unspecified: Secondary | ICD-10-CM

## 2013-01-13 DIAGNOSIS — E119 Type 2 diabetes mellitus without complications: Secondary | ICD-10-CM

## 2013-01-13 DIAGNOSIS — E785 Hyperlipidemia, unspecified: Secondary | ICD-10-CM

## 2013-01-13 DIAGNOSIS — G4700 Insomnia, unspecified: Secondary | ICD-10-CM

## 2013-01-13 DIAGNOSIS — F419 Anxiety disorder, unspecified: Secondary | ICD-10-CM

## 2013-01-13 DIAGNOSIS — J302 Other seasonal allergic rhinitis: Secondary | ICD-10-CM

## 2013-01-13 DIAGNOSIS — J449 Chronic obstructive pulmonary disease, unspecified: Secondary | ICD-10-CM

## 2013-01-13 DIAGNOSIS — F411 Generalized anxiety disorder: Secondary | ICD-10-CM

## 2013-01-13 DIAGNOSIS — I10 Essential (primary) hypertension: Secondary | ICD-10-CM

## 2013-01-13 DIAGNOSIS — L0390 Cellulitis, unspecified: Secondary | ICD-10-CM

## 2013-01-18 ENCOUNTER — Encounter: Payer: Self-pay | Admitting: Internal Medicine

## 2013-01-18 ENCOUNTER — Ambulatory Visit (INDEPENDENT_AMBULATORY_CARE_PROVIDER_SITE_OTHER): Payer: PRIVATE HEALTH INSURANCE | Admitting: Internal Medicine

## 2013-01-18 VITALS — BP 118/68 | HR 56 | Temp 98.1°F | Resp 14 | Ht 66.0 in | Wt 215.0 lb

## 2013-01-18 DIAGNOSIS — I10 Essential (primary) hypertension: Secondary | ICD-10-CM

## 2013-01-18 DIAGNOSIS — J209 Acute bronchitis, unspecified: Secondary | ICD-10-CM

## 2013-01-18 DIAGNOSIS — J4489 Other specified chronic obstructive pulmonary disease: Secondary | ICD-10-CM

## 2013-01-18 DIAGNOSIS — F0280 Dementia in other diseases classified elsewhere without behavioral disturbance: Secondary | ICD-10-CM

## 2013-01-18 DIAGNOSIS — G309 Alzheimer's disease, unspecified: Secondary | ICD-10-CM

## 2013-01-18 DIAGNOSIS — E119 Type 2 diabetes mellitus without complications: Secondary | ICD-10-CM

## 2013-01-18 DIAGNOSIS — J189 Pneumonia, unspecified organism: Secondary | ICD-10-CM

## 2013-01-18 DIAGNOSIS — N189 Chronic kidney disease, unspecified: Secondary | ICD-10-CM

## 2013-01-18 DIAGNOSIS — J449 Chronic obstructive pulmonary disease, unspecified: Secondary | ICD-10-CM

## 2013-01-18 MED ORDER — MOMETASONE FUROATE 50 MCG/ACT NA SUSP: 2.0000 | Freq: Every day | NASAL | Status: DC

## 2013-01-18 MED ORDER — FENOFIBRIC ACID 35 MG PO TABS: 35.0000 mg | ORAL_TABLET | Freq: Every evening | ORAL | Status: DC

## 2013-01-18 MED ORDER — BUDESONIDE-FORMOTEROL FUMARATE 80-4.5 MCG/ACT IN AERO: 2.0000 | INHALATION_SPRAY | Freq: Two times a day (BID) | RESPIRATORY_TRACT | Status: DC

## 2013-01-18 MED ORDER — FUROSEMIDE 20 MG PO TABS: 20.0000 mg | ORAL_TABLET | Freq: Every day | ORAL | Status: DC

## 2013-01-18 MED ORDER — GLIPIZIDE 2.5 MG HALF TABLET: 2.5000 mg | ORAL_TABLET | Freq: Every day | ORAL | Status: DC

## 2013-01-18 MED ORDER — HYDRALAZINE HCL 25 MG PO TABS: 25.0000 mg | ORAL_TABLET | Freq: Three times a day (TID) | ORAL | Status: DC

## 2013-01-18 MED ORDER — ALBUTEROL SULFATE HFA 108 (90 BASE) MCG/ACT IN AERS: 2.0000 | INHALATION_SPRAY | Freq: Four times a day (QID) | RESPIRATORY_TRACT | Status: DC | PRN

## 2013-01-18 NOTE — Patient Instructions (Signed)
1.5 Gram Low Sodium Diet A 1.5 gram sodium diet restricts the amount of salt in your diet. You can have no more than 1.5 grams (1500 miligrams) in 1 day. This can help lessen your risk for developing high blood pressure. This diet may also reduce your chance of having a heart attack or stroke. It is important that you know what to look for when choosing foods and drinks.  HOME CARE   Do not add salt to food.  Avoid convenience items and fast food.  Choose unsalted snack foods.  Buy products labeled "low sodium" or "no salt added" when possible.  Check food labels to learn how much sodium is in 1 serving.  When eating at a restaurant, ask that your food be prepared with less salt or none, if possible. The nutrition facts label is a good place to find how much sodium is in foods. Look for products with no more than 400 mg of sodium per serving. Remember that 1.5 g = 1500 mg. CHOOSING FOODS Grains  Avoid: Salted crackers and snack items. Some cereals, including instant hot cereals. Bread stuffing and biscuit mixes. Seasoned rice or pasta mixes.  Choose: Unsalted snack items. Low-sodium cereals, oats, puffed wheat and rice, shredded wheat. English muffins and bread. Pasta. Meats  Avoid:  Salted, canned, smoked, spiced, pickled meats, including fish and poultry. Bacon, ham, sausage, cold cuts, hot dogs, anchovies.  Choose: Low-sodium canned tuna and salmon. Fresh or frozen meat, poultry, and fish. Dairy  Avoid: Processed cheese and spreads. Cottage cheese. Buttermilk and condensed milk. Regular cheese.  Choose:  Milk. Low-sodium cottage cheese. Yogurt. Sour cream. Low-sodium cheese. Fruits and Vegetables  Avoid:  Regular canned vegetables. Regular canned tomato sauce and paste. Frozen vegetables in sauces. Olives. Angie Fava. Relishes. Sauerkraut.  Choose:  Low-sodium canned vegetables. Low-sodium tomato sauce and paste. Frozen or fresh vegetables. Fresh and frozen  fruit. Condiments  Avoid:  Canned and packaged gravies. Worcestershire sauce. Tartar sauce. Barbecue sauce. Soy sauce. Steak sauce. Ketchup. Onion, garlic, and table salt. Meat flavorings and tenderizers.  Choose:  Fresh and dried herbs and spices. Low-sodium varieties of mustard and ketchup. Lemon juice. Tabasco sauce. Horseradish. Document Released: 07/27/2010 Document Revised: 09/16/2011 Document Reviewed: 07/27/2010 [facility] Patient Information 2014 [place], [place].

## 2013-01-18 NOTE — Progress Notes (Signed)
Patient ID: Ian [name], male   DOB: July 11, 1935, 77 y.o.   MRN: [id number] Location:  [facility] / [place]  No Known Allergies  Chief Complaint  Patient presents with  . Hospitalization Follow-up    HPI: Patient is a 77 y.o. white male seen in the office today for hospital f/u.  He is here with his wife who has finished her chemo now.   Was at home one morning after had some morning coffee.  He lost his balance and went to the left, caught himself.  Wife noted a lot of confusion--he was unable to do his fingerstick.  She took him to the urgent care.  Went to [name] for a week and was treated for pneumonia.  Then was at [name] for a week and a half.  Then went home last Friday.  Doing great at home since.  Home care nurse came this am.    Review of Systems:  Review of Systems  Constitutional: Negative for fever, chills and weight loss.  HENT: Positive for congestion.        Chronic sinus congestion  Eyes: Negative for blurred vision.  Respiratory: Negative for shortness of breath.   Cardiovascular: Negative for chest pain and leg swelling.  Gastrointestinal: Negative for heartburn, abdominal pain and constipation.  Genitourinary: Negative for dysuria.  Musculoskeletal: Positive for falls. Negative for myalgias.  Skin: Negative for rash.  Neurological: Positive for weakness. Negative for dizziness and headaches.       Resolved  Endo/Heme/Allergies: Bruises/bleeds easily.  Psychiatric/Behavioral: Positive for memory loss. Negative for depression.     Past Medical History  Diagnosis Date  . Diabetes mellitus   . Hypertension   . COPD (chronic obstructive pulmonary disease)   . Kidney disease     mild  . Hyperlipidemia   . Hypertrophy of prostate without urinary obstruction and other lower urinary tract symptoms (LUTS)   . Hypercalcemia   . Edema   . Ventral hernia, unspecified, without mention of obstruction or gangrene   . Anxiety   .  Depression   . Allergy   . Unspecified sleep apnea   . Hypertrophy of prostate without urinary obstruction and other lower urinary tract symptoms (LUTS)   . Anemia, unspecified   . Unspecified disorder of kidney and ureter   . Alzheimer's disease   . Chronic maxillary sinusitis     Past Surgical History  Procedure Laterality Date  . Hernia repair    . Eyplosatory lap  2002    Social History:   reports that he quit smoking about 24 years ago. He does not have any smokeless tobacco history on file. He reports that he does not drink alcohol or use illicit drugs.  Family History  Problem Relation Age of Onset  . Pancreatic cancer Sister   . Heart disease Father   . Hypertension Brother     Medications: Patient's Medications  New Prescriptions   No medications on file  Previous Medications   ALBUTEROL (PROVENTIL HFA;VENTOLIN HFA) 108 (90 BASE) MCG/ACT INHALER    Inhale 2 puffs into the lungs every 6 (six) hours as needed for wheezing or shortness of breath.   AMLODIPINE (NORVASC) 10 MG TABLET    Take 10 mg by mouth daily.   BLOOD GLUCOSE MONITORING SUPPL (ONE TOUCH ULTRA SYSTEM KIT) W/DEVICE KIT    1 kit by Does not apply route once. Use as Directed. DX: 250.02   BUDESONIDE-FORMOTEROL (SYMBICORT) 80-4.5 MCG/ACT INHALER  Inhale 2 puffs into the lungs 2 (two) times daily.   FENOFIBRIC ACID 35 MG TABS    Take 35 mg by mouth every evening.    FUROSEMIDE (LASIX) 20 MG TABLET    Take 1 tablet (20 mg total) by mouth daily.   GLIPIZIDE (GLUCOTROL) 2.5 MG TABS    Take 0.5 tablets (2.5 mg total) by mouth daily before breakfast.   GLUCOSE BLOOD (BAYER CONTOUR TEST) TEST STRIP    Use as directed to check blood sugar to control diabetes. 250.00   GLUCOSE BLOOD TEST STRIP    Use as instructed once daily to help control blood sugar.   HYDRALAZINE (APRESOLINE) 25 MG TABLET    Take 1 tablet (25 mg total) by mouth every 8 (eight) hours.   LORATADINE (CLARITIN) 10 MG TABLET    Take 10 mg by mouth  daily as needed for allergies.    METOPROLOL (LOPRESSOR) 50 MG TABLET    Take 1 tablet (50 mg total) by mouth 2 (two) times daily.   MOMETASONE (NASONEX) 50 MCG/ACT NASAL SPRAY    Place 2 sprays into the nose daily.   MONTELUKAST (SINGULAIR) 10 MG TABLET    Take 1 tablet (10 mg total) by mouth at bedtime.   PAROXETINE (PAXIL) 20 MG TABLET    Take 20 mg by mouth every morning.   TRAZODONE (DESYREL) 150 MG TABLET    Take 150 mg by mouth at bedtime.  Modified Medications   No medications on file  Discontinued Medications   CAMPHOR-MENTHOL (SARNA) LOTION    Apply topically as needed for itching. Apply to back and trunk     Physical Exam: Filed Vitals:   01/18/13 1050  BP: 118/68  Pulse: 56  Temp: 98.1 F (36.7 C)  TempSrc: Oral  Resp: 14  Height: 5\' 6"  (1.676 m)  Weight: 215 lb (97.523 kg)  Physical Exam  Constitutional: He is oriented to person, place, and time. He appears well-developed and well-nourished. No distress.  Obese white male  HENT:  Head: Normocephalic and atraumatic.  Cardiovascular: Normal rate, regular rhythm, normal heart sounds and intact distal pulses.   Pulmonary/Chest: Effort normal and breath sounds normal. No respiratory distress. He has no wheezes.  Abdominal: Soft. Bowel sounds are normal. He exhibits no distension. There is no tenderness.  Musculoskeletal: Normal range of motion. He exhibits no edema and no tenderness.  Neurological: He is alert and oriented to person, place, and time.  Skin: Skin is warm and dry.  Psychiatric: He has a normal mood and affect.    [name] reviewed: Basic Metabolic Panel:  Recent [name]  01/02/13 0455 01/03/13 0615 01/04/13 0600  NA 141 137 139  K 3.8 4.2 4.0  CL 102 98 100  CO2 30 33* 34*  GLUCOSE 214* 172* 173*  BUN 37* 32* 38*  CREATININE 1.81* 1.74* 1.83*  CALCIUM 10.0 9.9 9.8   Liver Function Tests:  Recent [name]  12/28/12 1747  AST 14  ALT 14  ALKPHOS 56  BILITOT 0.7  PROT 8.6*  ALBUMIN 4.0   CBC:  Recent [name]  12/30/12 0558 12/31/12 0430 01/01/13 0900  WBC 10.8* 7.7 8.0  HGB 12.2* 12.3* 12.8*  HCT 35.6* 36.4* 37.8*  MCV 92.7 93.1 93.3  PLT 132* 133* 132*   Lab Results  Component Value Date   HGBA1C 6.7* 12/29/2012   Assessment/Plan 1. Pneumonia -has completed treatment, but needs f/u xray - DG Chest 2 View; Future  2. Diabetes mellitus -cont tradjenta, glipizide,  monitor for hypoglycemia--may need to stop glipizide--tried to explain that his glucose goal is <200 due to his increasing frailty not 120 to his wife who gets very upset if he has any sugars over 120  3. Alzheimer's disease -mild, not on meds, need to f/u his mmse  4. Acute bronchitis -needs renewal of inhaler and paperwork done so he could get his inhalers by discount through pharma, but they need to complete the rest of the financial info and mail it into the company - albuterol (PROVENTIL HFA;VENTOLIN HFA) 108 (90 BASE) MCG/ACT inhaler; Inhale 2 puffs into the lungs every 6 (six) hours as needed for wheezing or shortness of breath.  Dispense: 3 Inhaler; Refill: 3  [name]/tests ordered: Orders Placed This Encounter  Procedures  . DG Chest 2 View    Standing Status: Future     Number of Occurrences:      Standing Expiration Date: 03/21/2014    Order Specific Question:  Reason for Exam (SYMPTOM  OR DIAGNOSIS REQUIRED)    Answer:  left perihilar region opacity (was treated for pneumonia in 6/14), ? resolution    Order Specific Question:  Preferred imaging location?    Answer:  [facility]    Next appt: keep as scheduled

## 2013-02-05 ENCOUNTER — Telehealth: Payer: Self-pay | Admitting: Geriatric Medicine

## 2013-02-05 ENCOUNTER — Other Ambulatory Visit: Payer: Self-pay | Admitting: Internal Medicine

## 2013-02-05 MED ORDER — GLIPIZIDE 2.5 MG HALF TABLET: 2.5000 mg | ORAL_TABLET | Freq: Every day | ORAL | Status: DC

## 2013-02-05 MED ORDER — GLIPIZIDE 5 MG PO TABS: 5.0000 mg | ORAL_TABLET | Freq: Every day | ORAL | Status: DC

## 2013-02-05 NOTE — Telephone Encounter (Signed)
Increase to a full tablet of glipizide daily each morning.  (5mg )

## 2013-02-05 NOTE — Telephone Encounter (Signed)
Patient's blood sugars have been running 145 fasting in the mornings. The glipizide does not seem to be controlling his blood sugar. Should he take something else, or increase the dosage?

## 2013-02-05 NOTE — Telephone Encounter (Signed)
Patient aware.

## 2013-02-12 ENCOUNTER — Encounter: Payer: Self-pay | Admitting: Adult Health

## 2013-02-12 DIAGNOSIS — L0390 Cellulitis, unspecified: Secondary | ICD-10-CM | POA: Insufficient documentation

## 2013-02-12 NOTE — Progress Notes (Signed)
Patient ID: Ian [name], male   DOB: 07-19-1935, 77 y.o.   MRN: [id number]       PROGRESS NOTE  DATE: 01/13/2013   FACILITY: [facility]  LEVEL OF CARE: SNF (31)   CHIEF COMPLAINT:   Discharge Visit  HISTORY OF PRESENT ILLNESS: This is a 77 year old male who is for discharge home with home health PT and nursing. He has been admitted to [facility] on 01/04/13 from [facility] with principal discharge diagnosis of Pneumonia which has now been resolved. Patient was admitted to this facility for short-term rehabilitation. Note the left hand was warm to touch, swollen, erythematous and tender to touch. Patient has completed SNF rehabilitation and therapy has cleared the patient for discharge.  Reassessment of ongoing problem(s):  HTN: Pt 's HTN remains stable.  Denies CP, sob, DOE, pedal edema, headaches, dizziness or visual disturbances.  No complications from the medications currently being used.  Last BP :  124/54  DM:pt's DM remains stable.  Pt denies polyuria, polydipsia, polyphagia, changes in vision or hypoglycemic episodes.  No complications noted from the medication presently being used.  7/14 hemoglobin A1c 6.8   COPD: the COPD remains stable.  Pt denies sob, cough, wheezing or declining exercise tolerance.  No complications from the medications presently being used.   PAST MEDICAL HISTORY : Reviewed.  No changes.  CURRENT MEDICATIONS: Reviewed per [facility]  REVIEW OF SYSTEMS:  GENERAL: no change in appetite, no fatigue, no weight changes, no fever, chills or weakness RESPIRATORY: no cough, SOB, DOE, wheezing, hemoptysis CARDIAC: no chest pain,or palpitations GI: no abdominal pain, diarrhea, constipation, heart burn, nausea or vomiting  PHYSICAL EXAMINATION  VS:  T 97.9        P 65       RR 20      BP 124/54             WT 219  (Lb)  GENERAL: no acute distress, normal body habitus EYES: conjunctivae normal, sclerae normal, normal eye lids NECK: supple,  trachea midline, no neck masses, no thyroid tenderness, no thyromegaly RESPIRATORY: breathing is even & unlabored, BS CTAB CARDIAC: RRR, no murmur,no extra heart sounds, no edema GI: abdomen soft, normal BS, no masses, no tenderness, no hepatomegaly, no splenomegaly EXTREMITIES: Left hand is erythematous, edematous, 2+, warm to touch PSYCHIATRIC: the patient is alert & oriented to person, affect & behavior appropriate  [name]/RADIOLOGY: 01/05/13 hemoglobin A1c 6.8 01/04/13  NA 139  K 4.0 glucose 173 BUN 38  Creatinine 1.83  Calcium 9.8 01/01/13  Wbc 8.0  hgb 12.8  hct 37.8   ASSESSMENT/PLAN:  Depression - stable  Insomnia - stable  Diabetes mellitus - well-controlled  Anxiety - stable  Hypertension - well-controlled  COPD (chronic obstructive pulmonary disease) - stable  Hyperlipidemia - stable  Seasonal allergic rhinitis - stable  Cellulitis of left hand - start Bactrim DS 1 tab by mouth twice a day x7 days  I have filled out patient's discharge paperwork and written prescriptions.  Patient will receive home health PT and  Nursing    Total discharge time: Greater than 30 minutes Discharge time involved coordination of the discharge process with social worker, nursing staff and therapy department. Medical justification for home health services verified.  CPT CODE: 16109

## 2013-02-19 ENCOUNTER — Telehealth: Payer: Self-pay | Admitting: *Deleted

## 2013-02-19 NOTE — Telephone Encounter (Signed)
Patient wife called and stated that hospital changed patient's blood sugar medication and it is not controlling his blood sugars. Patient made an appointment to come in on 02/26/2013 and said he would be fine until then. Will call back if he decides to schedule a sooner appointment. Patient to go to urgent care if it spikes too high.

## 2013-02-26 ENCOUNTER — Ambulatory Visit (INDEPENDENT_AMBULATORY_CARE_PROVIDER_SITE_OTHER): Payer: PRIVATE HEALTH INSURANCE | Admitting: Internal Medicine

## 2013-02-26 ENCOUNTER — Encounter: Payer: Self-pay | Admitting: Internal Medicine

## 2013-02-26 VITALS — BP 126/70 | HR 54 | Temp 97.9°F | Resp 14 | Ht 66.0 in | Wt 213.8 lb

## 2013-02-26 DIAGNOSIS — F0280 Dementia in other diseases classified elsewhere without behavioral disturbance: Secondary | ICD-10-CM

## 2013-02-26 DIAGNOSIS — J4489 Other specified chronic obstructive pulmonary disease: Secondary | ICD-10-CM

## 2013-02-26 DIAGNOSIS — I10 Essential (primary) hypertension: Secondary | ICD-10-CM

## 2013-02-26 DIAGNOSIS — E119 Type 2 diabetes mellitus without complications: Secondary | ICD-10-CM

## 2013-02-26 DIAGNOSIS — J449 Chronic obstructive pulmonary disease, unspecified: Secondary | ICD-10-CM

## 2013-02-26 MED ORDER — LINAGLIPTIN 5 MG PO TABS: 5.0000 mg | ORAL_TABLET | Freq: Every day | ORAL | Status: DC

## 2013-02-26 MED ORDER — FENOFIBRIC ACID 35 MG PO TABS: 35.0000 mg | ORAL_TABLET | Freq: Every evening | ORAL | Status: DC

## 2013-02-26 NOTE — Progress Notes (Signed)
Patient ID: Ian [name], male   DOB: 04/15/1936, 77 y.o.   MRN: [id number] Location:  [facility]  Code Status: will readdress next visit   No Known Allergies  Chief Complaint  Patient presents with  . Medical Managment of Chronic Issues    HPI: Patient is a 77 y.o.  seen in the office today for med mgt of chronic diseases.  He is accompanied by his wife.    His inhalers are too expensive.  [employer] was going to be $600.  His wife is paying off her chemo and has more to come.  He needs fenofibrate.  Glipizide is not working.  Was taken off glimepiride due to kidney effects.    Had [name] thru nephrology this week.    Review of Systems:  Review of Systems  Constitutional: Negative for fever, chills and weight loss.       Weight steady  HENT: Positive for hearing loss. Negative for congestion.        Hearing aides  Eyes: Negative for blurred vision.  Respiratory: Positive for cough. Negative for shortness of breath and wheezing.   Cardiovascular: Negative for chest pain.  Gastrointestinal: Negative for abdominal pain, diarrhea, constipation, blood in stool and melena.  Genitourinary: Negative for dysuria.  Musculoskeletal: Negative for falls.  Neurological: Negative for dizziness and weakness.  Psychiatric/Behavioral: Positive for memory loss.     Past Medical History  Diagnosis Date  . Diabetes mellitus   . Hypertension   . COPD (chronic obstructive pulmonary disease)   . Kidney disease     mild  . Hyperlipidemia   . Hypertrophy of prostate without urinary obstruction and other lower urinary tract symptoms (LUTS)   . Hypercalcemia   . Edema   . Ventral hernia, unspecified, without mention of obstruction or gangrene   . Anxiety   . Depression   . Allergy   . Unspecified sleep apnea   . Hypertrophy of prostate without urinary obstruction and other lower urinary tract symptoms (LUTS)   . Anemia, unspecified   . Unspecified disorder of  kidney and ureter   . Alzheimer's disease   . Chronic maxillary sinusitis     Past Surgical History  Procedure Laterality Date  . Hernia repair    . Eyplosatory lap  2002    Social History:   reports that he quit smoking about 24 years ago. He does not have any smokeless tobacco history on file. He reports that he does not drink alcohol or use illicit drugs.  Family History  Problem Relation Age of Onset  . Pancreatic cancer Sister   . Heart disease Father   . Hypertension Brother     Medications: Patient's Medications  New Prescriptions   No medications on file  Previous Medications   ALBUTEROL (PROVENTIL HFA;VENTOLIN HFA) 108 (90 BASE) MCG/ACT INHALER    Inhale 2 puffs into the lungs every 6 (six) hours as needed for wheezing or shortness of breath.   AMLODIPINE (NORVASC) 10 MG TABLET    Take 10 mg by mouth daily.   BLOOD GLUCOSE MONITORING SUPPL (ONE TOUCH ULTRA SYSTEM KIT) W/DEVICE KIT    1 kit by Does not apply route once. Use as Directed. DX: 250.02   BUDESONIDE-FORMOTEROL (SYMBICORT) 80-4.5 MCG/ACT INHALER    Inhale 2 puffs into the lungs 2 (two) times daily.   FENOFIBRIC ACID 35 MG TABS    Take 1 tablet (35 mg total) by mouth every evening.   FUROSEMIDE (  LASIX) 20 MG TABLET    Take 1 tablet (20 mg total) by mouth daily.   GLIPIZIDE (GLUCOTROL) 5 MG TABLET    Take 1 tablet (5 mg total) by mouth daily before breakfast.   GLUCOSE BLOOD (BAYER CONTOUR TEST) TEST STRIP    Use as directed to check blood sugar to control diabetes. 250.00   GLUCOSE BLOOD TEST STRIP    Use as instructed once daily to help control blood sugar.   HYDRALAZINE (APRESOLINE) 25 MG TABLET    Take 1 tablet (25 mg total) by mouth every 8 (eight) hours.   LORATADINE (CLARITIN) 10 MG TABLET    Take 10 mg by mouth daily as needed for allergies.    METOPROLOL (LOPRESSOR) 50 MG TABLET    Take 1 tablet (50 mg total) by mouth 2 (two) times daily.   MOMETASONE (NASONEX) 50 MCG/ACT NASAL SPRAY    Place 2 sprays  into the nose daily.   MONTELUKAST (SINGULAIR) 10 MG TABLET    Take 1 tablet (10 mg total) by mouth at bedtime.   PAROXETINE (PAXIL) 20 MG TABLET    Take 20 mg by mouth every morning.   TRAZODONE (DESYREL) 150 MG TABLET    Take 150 mg by mouth at bedtime.  Modified Medications   No medications on file  Discontinued Medications   No medications on file     Physical Exam: Filed Vitals:   02/26/13 1052  BP: 126/70  Pulse: 54  Temp: 97.9 F (36.6 C)  TempSrc: Oral  Resp: 14  Height: 5\' 6"  (1.676 m)  Weight: 213 lb 12.8 oz (96.979 kg)  Physical Exam  Constitutional: He is oriented to person, place, and time. He appears well-developed and well-nourished. No distress.  Obese white male  HENT:  Head: Normocephalic and atraumatic.  Eyes: EOM are normal. Pupils are equal, round, and reactive to light.  Cardiovascular: Normal rate, regular rhythm, normal heart sounds and intact distal pulses.   Pulmonary/Chest: Effort normal and breath sounds normal.  Abdominal: Soft. Bowel sounds are normal. He exhibits no distension. There is no tenderness.  Musculoskeletal: Normal range of motion. He exhibits no edema and no tenderness.  Neurological: He is alert and oriented to person, place, and time.  Skin: Skin is warm and dry.     [name] reviewed: Basic Metabolic Panel:  Recent [name]  01/02/13 0455 01/03/13 0615 01/04/13 0600  NA 141 137 139  K 3.8 4.2 4.0  CL 102 98 100  CO2 30 33* 34*  GLUCOSE 214* 172* 173*  BUN 37* 32* 38*  CREATININE 1.81* 1.74* 1.83*  CALCIUM 10.0 9.9 9.8   Liver Function Tests:  Recent [name]  12/28/12 1747  AST 14  ALT 14  ALKPHOS 56  BILITOT 0.7  PROT 8.6*  ALBUMIN 4.0  CBC:  Recent [name]  12/30/12 0558 12/31/12 0430 01/01/13 0900  WBC 10.8* 7.7 8.0  HGB 12.2* 12.3* 12.8*  HCT 35.6* 36.4* 37.8*  MCV 92.7 93.1 93.3  PLT 132* 133* 132*   Lab Results  Component Value Date   HGBA1C 6.7* 12/29/2012   Assessment/Plan 1. COPD (chronic  obstructive pulmonary disease) -having difficulty paying for symbicort and singulair and cannot do without these meds--wife is on chemo -given sample of symbicort and printed out applications for assistance programs to help them pay for both of these meds--we pill gladly provide the printed prescriptions as needed when they complete the forms -he is currently stable on these with prn albuterol  2.  Diabetes mellitus -recently having hyperglycemia in the 200s, but feels well--is usually more adherent with his diet--does sometimes get caught by his wife snacking late at night causing high sugars first thin in the morning -will d/c glipizide which is not working well and puts him at risk of hypoglycemia and begin tradjenta--samples given --it is not on formulary and the card does not work due to his having medicare -cholesterol med refilled also - linagliptin (TRADJENTA) 5 MG TABS tablet; Take 1 tablet (5 mg total) by mouth daily.  Dispense: 90 tablet; Refill: 3 - Fenofibric Acid 35 MG TABS; Take 1 tablet (35 mg total) by mouth every evening.  Dispense: 90 tablet; Refill: 3  3. Hypertension -at goal with current therapy  4. Alzheimer's disease -I think he has mild cognitive impairment not AD at this point -continue to monitor -I also think his extreme hearing loss before hearing aides made his memory testing appear worse than it really was  [name]/tests ordered:    None at present, will order before regular visit Next appt:  1 month to f/u on sugars

## 2013-03-11 ENCOUNTER — Encounter: Payer: Self-pay | Admitting: Internal Medicine

## 2013-03-11 ENCOUNTER — Ambulatory Visit (INDEPENDENT_AMBULATORY_CARE_PROVIDER_SITE_OTHER): Payer: PRIVATE HEALTH INSURANCE | Admitting: Internal Medicine

## 2013-03-11 VITALS — BP 130/72 | HR 50 | Temp 97.6°F | Wt 212.0 lb

## 2013-03-11 DIAGNOSIS — E785 Hyperlipidemia, unspecified: Secondary | ICD-10-CM

## 2013-03-11 DIAGNOSIS — E119 Type 2 diabetes mellitus without complications: Secondary | ICD-10-CM

## 2013-03-11 DIAGNOSIS — J449 Chronic obstructive pulmonary disease, unspecified: Secondary | ICD-10-CM

## 2013-03-11 DIAGNOSIS — J4489 Other specified chronic obstructive pulmonary disease: Secondary | ICD-10-CM

## 2013-03-11 DIAGNOSIS — J320 Chronic maxillary sinusitis: Secondary | ICD-10-CM

## 2013-03-11 NOTE — Progress Notes (Signed)
Patient ID: Ian [name], male   DOB: September 08, 1935, 77 y.o.   MRN: [id number] Location:  [facility]  Code Status: DNR   No Known Allergies  Chief Complaint  Patient presents with  . Hyperglycemia    Patient c/o of elevated blood sugar readings at home     HPI: Patient is a 77 y.o. white male seen in the office today for acute for hyperglycemia--am glucose in 120s-190s.    Was just started on tradjenta.  Had 10 lb weight loss.  HR 50 with metoprolol--asymptomatic.    Went to nephrology and lasix was increased  Review of Systems:  Review of Systems  Constitutional: Positive for weight loss.       222 down to 212  HENT: Positive for hearing loss and congestion.   Eyes: Negative for blurred vision.       Just got new glasses  Respiratory: Negative for shortness of breath.   Cardiovascular: Negative for chest pain.  Gastrointestinal: Negative for nausea, vomiting, diarrhea and constipation.  Genitourinary: Negative for dysuria and urgency.  Musculoskeletal: Negative for back pain and joint pain.  Neurological: Negative for dizziness, loss of consciousness and weakness.  Psychiatric/Behavioral: Positive for memory loss. Negative for depression. The patient is not nervous/anxious.      Past Medical History  Diagnosis Date  . Diabetes mellitus   . Hypertension   . COPD (chronic obstructive pulmonary disease)   . Kidney disease     mild  . Hyperlipidemia   . Hypertrophy of prostate without urinary obstruction and other lower urinary tract symptoms (LUTS)   . Hypercalcemia   . Edema   . Ventral hernia, unspecified, without mention of obstruction or gangrene   . Anxiety   . Depression   . Allergy   . Unspecified sleep apnea   . Hypertrophy of prostate without urinary obstruction and other lower urinary tract symptoms (LUTS)   . Anemia, unspecified   . Unspecified disorder of kidney and ureter   . Alzheimer's disease   . Chronic  maxillary sinusitis     Past Surgical History  Procedure Laterality Date  . Hernia repair    . Eyplosatory lap  2002    Social History:   reports that he quit smoking about 24 years ago. He does not have any smokeless tobacco history on file. He reports that he does not drink alcohol or use illicit drugs.  Family History  Problem Relation Age of Onset  . Pancreatic cancer Sister   . Heart disease Father   . Hypertension Brother     Medications: Patient's Medications  New Prescriptions   No medications on file  Previous Medications   ALBUTEROL (PROVENTIL HFA;VENTOLIN HFA) 108 (90 BASE) MCG/ACT INHALER    Inhale 2 puffs into the lungs every 6 (six) hours as needed for wheezing or shortness of breath.   AMLODIPINE (NORVASC) 10 MG TABLET    Take 10 mg by mouth daily.   BLOOD GLUCOSE MONITORING SUPPL (ONE TOUCH ULTRA SYSTEM KIT) W/DEVICE KIT    1 kit by Does not apply route once. Use as Directed. DX: 250.02   BUDESONIDE-FORMOTEROL (SYMBICORT) 80-4.5 MCG/ACT INHALER    Inhale 2 puffs into the lungs 2 (two) times daily.   FENOFIBRIC ACID 35 MG TABS    Take 1 tablet (35 mg total) by mouth every evening.   FUROSEMIDE (LASIX) 40 MG TABLET    Take 40 mg by mouth daily.   GLUCOSE BLOOD (BAYER  CONTOUR TEST) TEST STRIP    Use as directed to check blood sugar to control diabetes. 250.00   GLUCOSE BLOOD TEST STRIP    Use as instructed once daily to help control blood sugar.   HYDRALAZINE (APRESOLINE) 25 MG TABLET    Take 1 tablet (25 mg total) by mouth every 8 (eight) hours.   LINAGLIPTIN (TRADJENTA) 5 MG TABS TABLET    Take 1 tablet (5 mg total) by mouth daily.   LORATADINE (CLARITIN) 10 MG TABLET    Take 10 mg by mouth daily as needed for allergies.    METOPROLOL (LOPRESSOR) 50 MG TABLET    Take 1 tablet (50 mg total) by mouth 2 (two) times daily.   MOMETASONE (NASONEX) 50 MCG/ACT NASAL SPRAY    Place 2 sprays into the nose daily.   MONTELUKAST (SINGULAIR) 10 MG TABLET    Take 1 tablet (10 mg  total) by mouth at bedtime.   PAROXETINE (PAXIL) 20 MG TABLET    Take 20 mg by mouth every morning.   TRAZODONE (DESYREL) 150 MG TABLET    Take 150 mg by mouth at bedtime.  Modified Medications   No medications on file  Discontinued Medications   FUROSEMIDE (LASIX) 20 MG TABLET    Take 1 tablet (20 mg total) by mouth daily.     Physical Exam: Filed Vitals:   03/11/13 0953  BP: 130/72  Pulse: 50  Temp: 97.6 F (36.4 C)  TempSrc: Oral  Weight: 212 lb (96.163 kg)  SpO2: 94%  Physical Exam  Constitutional: He appears well-developed and well-nourished.  Obese white male  HENT:  Head: Normocephalic and atraumatic.  Cardiovascular: Normal rate, regular rhythm, normal heart sounds and intact distal pulses.   Pulmonary/Chest: Effort normal and breath sounds normal. He has no wheezes.  Abdominal: Soft. Bowel sounds are normal. He exhibits no distension.  Musculoskeletal: He exhibits edema.  Neurological: He is alert.  Skin: Skin is warm and dry.    [name] reviewed: Basic Metabolic Panel:  Recent [name]  01/02/13 0455 01/03/13 0615 01/04/13 0600  NA 141 137 139  K 3.8 4.2 4.0  CL 102 98 100  CO2 30 33* 34*  GLUCOSE 214* 172* 173*  BUN 37* 32* 38*  CREATININE 1.81* 1.74* 1.83*  CALCIUM 10.0 9.9 9.8   Liver Function Tests:  Recent [name]  12/28/12 1747  AST 14  ALT 14  ALKPHOS 56  BILITOT 0.7  PROT 8.6*  ALBUMIN 4.0  CBC:  Recent [name]  12/30/12 0558 12/31/12 0430 01/01/13 0900  WBC 10.8* 7.7 8.0  HGB 12.2* 12.3* 12.8*  HCT 35.6* 36.4* 37.8*  MCV 92.7 93.1 93.3  PLT 132* 133* 132*   Lab Results  Component Value Date   HGBA1C 6.7* 12/29/2012    Assessment/Plan 1. Diabetes mellitus -has lost some weight since tradjenta started - Hemoglobin 123456 - Basic metabolic panel  2. COPD (chronic obstructive pulmonary disease) -cont current therapy, improved recently  3. Chronic maxillary sinusitis -stable, no complaints at present - CBC with Differential  4.  Hyperlipidemia -unable to afford cholesterol med b/c insurance will no longer cover--will reassess levels and see which type we can give him--needs one b/c of his diabetes - Lipid panel  [name]/tests ordered: FLP, hba1c, cbc, bmp today Next appt: 3 mos--flu shot next time

## 2013-03-12 ENCOUNTER — Other Ambulatory Visit: Payer: Self-pay | Admitting: Internal Medicine

## 2013-03-12 DIAGNOSIS — E785 Hyperlipidemia, unspecified: Secondary | ICD-10-CM

## 2013-03-12 LAB — CBC WITH DIFFERENTIAL/PLATELET
Basophils Absolute: 0.1 10*3/uL (ref 0.0–0.2)
Basos: 1 % (ref 0–3)
Eos: 2 % (ref 0–5)
Eosinophils Absolute: 0.2 10*3/uL (ref 0.0–0.4)
HCT: 38.8 % (ref 37.5–51.0)
Hemoglobin: 13.3 g/dL (ref 12.6–17.7)
Immature Grans (Abs): 0 10*3/uL (ref 0.0–0.1)
Immature Granulocytes: 0 % (ref 0–2)
Lymphocytes Absolute: 2.7 10*3/uL (ref 0.7–3.1)
Lymphs: 38 % (ref 14–46)
MCH: 31.4 pg (ref 26.6–33.0)
MCHC: 34.3 g/dL (ref 31.5–35.7)
MCV: 92 fL (ref 79–97)
Monocytes Absolute: 0.8 10*3/uL (ref 0.1–0.9)
Monocytes: 11 % (ref 4–12)
Neutrophils Absolute: 3.5 10*3/uL (ref 1.4–7.0)
Neutrophils Relative %: 48 % (ref 40–74)
RBC: 4.24 x10E6/uL (ref 4.14–5.80)
RDW: 15.5 % — ABNORMAL HIGH (ref 12.3–15.4)
WBC: 7.1 10*3/uL (ref 3.4–10.8)

## 2013-03-12 LAB — LIPID PANEL
Chol/HDL Ratio: 6.7 ratio units — ABNORMAL HIGH (ref 0.0–5.0)
Cholesterol, Total: 267 mg/dL — ABNORMAL HIGH (ref 100–199)
HDL: 40 mg/dL (ref >39)
LDL Calculated: 202 mg/dL — ABNORMAL HIGH (ref 0–99)
Triglycerides: 125 mg/dL (ref 0–149)
VLDL Cholesterol Cal: 25 mg/dL (ref 5–40)

## 2013-03-12 LAB — BASIC METABOLIC PANEL
BUN/Creatinine Ratio: 17 (ref 10–22)
BUN: 27 mg/dL (ref 8–27)
CO2: 23 mmol/L (ref 18–29)
Calcium: 10.2 mg/dL (ref 8.6–10.2)
Chloride: 102 mmol/L (ref 97–108)
Creatinine, Ser: 1.6 mg/dL — ABNORMAL HIGH (ref 0.76–1.27)
GFR calc Af Amer: 48 mL/min/{1.73_m2} — ABNORMAL LOW (ref >59)
GFR calc non Af Amer: 42 mL/min/{1.73_m2} — ABNORMAL LOW (ref >59)
Glucose: 135 mg/dL — ABNORMAL HIGH (ref 65–99)
Potassium: 4.1 mmol/L (ref 3.5–5.2)
Sodium: 140 mmol/L (ref 134–144)

## 2013-03-12 LAB — HEMOGLOBIN A1C
Est. average glucose Bld gHb Est-mCnc: 140 mg/dL
Hgb A1c MFr Bld: 6.5 % — ABNORMAL HIGH (ref 4.8–5.6)

## 2013-03-12 MED ORDER — ATORVASTATIN CALCIUM 20 MG PO TABS: 20.0000 mg | ORAL_TABLET | Freq: Every day | ORAL | Status: DC

## 2013-03-12 NOTE — Progress Notes (Addendum)
Pt's lipids remain elevated.  His insurance no longer covers his fenofibrate.  Triglycerides and sugar are much better.  Therefore, I will start him on lipitor 20mg  instead.  If he develops any weakness or pain diffusely, his wife should notify me.    Detailed message left on voicemail for patient's wife with above information/instructions. CBeatty/CMA

## 2013-03-16 ENCOUNTER — Other Ambulatory Visit: Payer: Self-pay | Admitting: *Deleted

## 2013-03-16 MED ORDER — GLUCOSE BLOOD VI STRP: ORAL_STRIP | Status: DC

## 2013-03-26 ENCOUNTER — Ambulatory Visit: Payer: PRIVATE HEALTH INSURANCE | Admitting: Internal Medicine

## 2013-03-31 ENCOUNTER — Telehealth: Payer: Self-pay | Admitting: *Deleted

## 2013-03-31 NOTE — Telephone Encounter (Signed)
We can try resuming his glipizide with the tradjenta to see if this helps.  This is his best option because of his kidney function.  He will require dose adjustments of other new meds like invokana (this would be my next consideration if tradjenta and glipizide together is ineffective)

## 2013-03-31 NOTE — Telephone Encounter (Signed)
Wife, Arbie Cookey, called and stated that patient's blood sugars have been running 160-200, feels like the samples given at the appointment are not working well. Patient have used them but blood sugars still running high. Please advise.

## 2013-04-01 ENCOUNTER — Other Ambulatory Visit: Payer: Self-pay | Admitting: Internal Medicine

## 2013-04-01 ENCOUNTER — Other Ambulatory Visit: Payer: Self-pay | Admitting: *Deleted

## 2013-04-01 DIAGNOSIS — E119 Type 2 diabetes mellitus without complications: Secondary | ICD-10-CM

## 2013-04-01 MED ORDER — GLIPIZIDE 5 MG PO TABS: ORAL_TABLET | ORAL | Status: DC

## 2013-04-01 MED ORDER — LINAGLIPTIN 5 MG PO TABS: 5.0000 mg | ORAL_TABLET | Freq: Every day | ORAL | Status: DC

## 2013-04-01 MED ORDER — BUDESONIDE-FORMOTEROL FUMARATE 80-4.5 MCG/ACT IN AERO: 2.0000 | INHALATION_SPRAY | Freq: Two times a day (BID) | RESPIRATORY_TRACT | Status: DC

## 2013-04-01 MED ORDER — MONTELUKAST SODIUM 10 MG PO TABS: 10.0000 mg | ORAL_TABLET | Freq: Every day | ORAL | Status: DC

## 2013-04-01 NOTE — Progress Notes (Signed)
Paperwork completed for [employer] (symbicort) and [employer] Nurse, mental health) patient prescription savings programs.   Please call Waite Mischler and notify her that they are done but require their annual income to be completed and tax information attached to be sent into the companies.

## 2013-04-01 NOTE — Telephone Encounter (Signed)
Patient Notified and called in [place] 5mg  Once daily and Tradjenta 5mg  Once daily to [facility]. Informed that if this didn't control blood sugar he will need an appointment. Agreed.

## 2013-04-02 ENCOUNTER — Telehealth: Payer: Self-pay | Admitting: *Deleted

## 2013-04-02 NOTE — Telephone Encounter (Signed)
Called patient to pick up [place] Application for free medications. Forms are ready and left up front for pickup. Patient to complete some questions in form.

## 2013-04-12 ENCOUNTER — Other Ambulatory Visit: Payer: Self-pay | Admitting: *Deleted

## 2013-04-23 ENCOUNTER — Ambulatory Visit: Payer: PRIVATE HEALTH INSURANCE

## 2013-04-23 DIAGNOSIS — Z23 Encounter for immunization: Secondary | ICD-10-CM

## 2013-05-20 ENCOUNTER — Ambulatory Visit (INDEPENDENT_AMBULATORY_CARE_PROVIDER_SITE_OTHER): Payer: PRIVATE HEALTH INSURANCE | Admitting: Pulmonary Disease

## 2013-05-20 ENCOUNTER — Encounter: Payer: Self-pay | Admitting: Pulmonary Disease

## 2013-05-20 ENCOUNTER — Ambulatory Visit (INDEPENDENT_AMBULATORY_CARE_PROVIDER_SITE_OTHER)
Admission: RE | Admit: 2013-05-20 | Discharge: 2013-05-20 | Disposition: A | Discharge status: Home / Self Care | Payer: PRIVATE HEALTH INSURANCE | Source: Ambulatory Visit | Attending: Pulmonary Disease | Admitting: Pulmonary Disease

## 2013-05-20 VITALS — BP 120/72 | HR 60 | Ht 67.0 in | Wt 213.0 lb

## 2013-05-20 DIAGNOSIS — Z8701 Personal history of pneumonia (recurrent): Secondary | ICD-10-CM

## 2013-05-20 DIAGNOSIS — J302 Other seasonal allergic rhinitis: Secondary | ICD-10-CM

## 2013-05-20 DIAGNOSIS — J449 Chronic obstructive pulmonary disease, unspecified: Secondary | ICD-10-CM

## 2013-05-20 DIAGNOSIS — J189 Pneumonia, unspecified organism: Secondary | ICD-10-CM

## 2013-05-20 DIAGNOSIS — J309 Allergic rhinitis, unspecified: Secondary | ICD-10-CM

## 2013-05-20 NOTE — Patient Instructions (Signed)
Chest xray today >> will call with results Follow up in 6 months 

## 2013-05-20 NOTE — Progress Notes (Signed)
Chief Complaint  Patient presents with  . COPD    Breathing is doing well. Reports SOB from time to time. Denies chest tightness or coughing.    History of Present Illness: Ian [name] is a 77 y.o. male former smoker with chronic cough from COPD/asthma, and post-nasal drip.  He was treated for pneumonia and heart failure in June.  He has been doing better recently.  He is not having much cough, wheeze, or sputum.  His allergies have been okay.  He is not having nocturnal cough, and denies leg swelling.  He uses his albuterol 2 to 3 times per week.  TESTS: PFT 11/29/11>>FEV1 1.71 (67%), FEV1% 64, TLC 5.29 (92%), DLCO 84%, +BD CT chest 12/29/12 >> lingular PNA  Ian [name]  has a past medical history of Diabetes mellitus; Hypertension; COPD (chronic obstructive pulmonary disease); Kidney disease; Hyperlipidemia; Hypertrophy of prostate without urinary obstruction and other lower urinary tract symptoms (LUTS); Hypercalcemia; Edema; Ventral hernia, unspecified, without mention of obstruction or gangrene; Anxiety; Depression; Allergy; Unspecified sleep apnea; Hypertrophy of prostate without urinary obstruction and other lower urinary tract symptoms (LUTS); Anemia, unspecified; Unspecified disorder of kidney and ureter; Alzheimer's disease; and Chronic maxillary sinusitis.  Ian [name]  has past surgical history that includes Hernia repair and eyplosatory lap (2002).  Prior to Admission medications   Medication Sig Start Date End Date Taking? Authorizing Provider  albuterol (PROVENTIL HFA;VENTOLIN HFA) 108 (90 BASE) MCG/ACT inhaler Inhale 2 puffs into the lungs every 6 (six) hours as needed.   Yes Historical Provider, MD  amLODipine (NORVASC) 10 MG tablet Take 10 mg by mouth daily.   Yes Historical Provider, MD  BAYER CONTOUR TEST test strip 1 strip Daily. 11/20/11  Yes Historical Provider, MD  budesonide-formoterol (SYMBICORT) 80-4.5 MCG/ACT inhaler Inhale 2 puffs into the lungs 2 (two) times daily.    Yes Historical Provider, MD  cetirizine (ZYRTEC) 10 MG tablet Take 1 tablet (10 mg total) by mouth daily. One tab daily for allergies 10/28/11 10/27/12 Yes Billy Fischer, MD  Fenofibric Acid 35 MG TABS Take 35 mg by mouth daily.   Yes Historical Provider, MD  glimepiride (AMARYL) 4 MG tablet Take 4 mg by mouth daily before breakfast.   Yes Historical Provider, MD  lisinopril (PRINIVIL,ZESTRIL) 5 MG tablet Take 5 mg by mouth daily.   Yes Historical Provider, MD  loratadine (CLARITIN) 10 MG tablet Take 10 mg by mouth daily as needed.   Yes Historical Provider, MD  metoprolol tartrate (LOPRESSOR) 25 MG tablet Take 25 mg by mouth 2 (two) times daily.    Yes Historical Provider, MD  mometasone (NASONEX) 50 MCG/ACT nasal spray Place 2 sprays into the nose daily as needed (allergies). 06/10/12 06/10/13 Yes Chesley Mires, MD  PARoxetine (PAXIL) 20 MG tablet Take 20 mg by mouth every morning.   Yes Historical Provider, MD  traZODone (DESYREL) 150 MG tablet Take 150 mg by mouth at bedtime.   Yes Historical Provider, MD    No Known Allergies   Physical Exam:  General - No distress ENT - No sinus tenderness, no oral exudate Cardiac - s1s2 regular, no murmur Chest - No wheeze/rales/dullness Back - No focal tenderness Abd - Soft, non-tender Ext - No edema Neuro - Normal strength Skin - No rashes Psych - normal mood, and behavior   Assessment/Plan:  Chesley Mires, MD [facility] Pager:  [phone]

## 2013-05-20 NOTE — Assessment & Plan Note (Signed)
Stable on current regimen   

## 2013-05-20 NOTE — Assessment & Plan Note (Signed)
Last CXR from June 2014 showed persistent infiltrate.  Will repeat CXR today, and call him with results.

## 2013-05-20 NOTE — Assessment & Plan Note (Signed)
Stable on current inhaler regimen and singulair.

## 2013-05-21 ENCOUNTER — Telehealth: Payer: Self-pay | Admitting: Pulmonary Disease

## 2013-05-21 NOTE — Telephone Encounter (Signed)
atc received busy signal x1

## 2013-05-21 NOTE — Telephone Encounter (Signed)
Dg Chest 2 View  05/20/2013   CLINICAL DATA:  COPD.  Shortness of breath.  EXAM: CHEST  2 VIEW  COMPARISON:  PA and lateral chest 01/01/2013.  FINDINGS: Pneumonia on the left seen on the prior examination has resolved. The lungs appear clear. The chest is hyperexpanded. No pneumothorax or pleural fluid. Heart size is upper normal.  IMPRESSION: No acute finding.  Interval resolution of pneumonia on the left.   Electronically Signed   By: Inge Rise M.D.   On: 05/20/2013 16:21   Will have my nurse inform pt that CXR is improved compared to June 2014.  Pneumonia changes resolved.

## 2013-05-24 NOTE — Telephone Encounter (Signed)
Returning the nurses  call [phone]

## 2013-05-24 NOTE — Telephone Encounter (Signed)
Spouse aware of results nothing further needed

## 2013-05-24 NOTE — Telephone Encounter (Signed)
lmtcb x1 

## 2013-06-09 ENCOUNTER — Other Ambulatory Visit: Payer: Self-pay | Admitting: Nurse Practitioner

## 2013-06-10 ENCOUNTER — Encounter: Payer: Self-pay | Admitting: Internal Medicine

## 2013-06-10 ENCOUNTER — Ambulatory Visit (INDEPENDENT_AMBULATORY_CARE_PROVIDER_SITE_OTHER): Payer: PRIVATE HEALTH INSURANCE | Admitting: Internal Medicine

## 2013-06-10 VITALS — BP 136/70 | HR 54 | Temp 97.5°F | Wt 216.6 lb

## 2013-06-10 DIAGNOSIS — E119 Type 2 diabetes mellitus without complications: Secondary | ICD-10-CM

## 2013-06-10 DIAGNOSIS — L853 Xerosis cutis: Secondary | ICD-10-CM

## 2013-06-10 DIAGNOSIS — J4489 Other specified chronic obstructive pulmonary disease: Secondary | ICD-10-CM

## 2013-06-10 DIAGNOSIS — L738 Other specified follicular disorders: Secondary | ICD-10-CM

## 2013-06-10 DIAGNOSIS — J302 Other seasonal allergic rhinitis: Secondary | ICD-10-CM

## 2013-06-10 DIAGNOSIS — I10 Essential (primary) hypertension: Secondary | ICD-10-CM

## 2013-06-10 DIAGNOSIS — J449 Chronic obstructive pulmonary disease, unspecified: Secondary | ICD-10-CM

## 2013-06-10 DIAGNOSIS — E785 Hyperlipidemia, unspecified: Secondary | ICD-10-CM

## 2013-06-10 DIAGNOSIS — J309 Allergic rhinitis, unspecified: Secondary | ICD-10-CM

## 2013-06-10 NOTE — Progress Notes (Signed)
Patient ID: Ian [name], male   DOB: [birth year], 77 y.o.   MRN: [id number]   Location:  [facility] / [place] =No Known Allergies  Chief Complaint  Patient presents with  . Medical Managment of Chronic Issues    3 month follow-up, elevated blood sugar   . Medication Refill    Discuss changing inhaler to cheaper alternative: Symbicort  . Leg Problem    Patient with unexplained marks on lower legs     HPI: Patient is a 77 y.o. white male seen in the office today for medical mgt of his chronic diseases.  Overall doing well.  Here alone today.    Dry scaly legs that he's been itching  Symbicort expensive--never sent in paperwork to get the discount.    Sugar is averaging 160-170.  Not reaching 200.    Review of Systems:  Review of Systems  Constitutional: Negative for fever, chills, weight loss and malaise/fatigue.  Eyes: Negative for blurred vision.       Sees eye doctor tomorrow  Respiratory: Negative for shortness of breath.   Cardiovascular: Negative for chest pain and leg swelling.  Gastrointestinal: Negative for constipation, blood in stool and melena.  Genitourinary: Positive for frequency. Negative for dysuria.       Takes fluid pill that works!  Musculoskeletal: Negative for back pain, falls and joint pain.  Skin: Positive for itching and rash.  Neurological: Negative for dizziness and headaches.       One episode of dizziness a while back  Psychiatric/Behavioral: Positive for memory loss. Negative for depression.     Past Medical History  Diagnosis Date  . Diabetes mellitus   . Hypertension   . COPD (chronic obstructive pulmonary disease)   . Kidney disease     mild  . Hyperlipidemia   . Hypertrophy of prostate without urinary obstruction and other lower urinary tract symptoms (LUTS)   . Hypercalcemia   . Edema   . Ventral hernia, unspecified, without mention of obstruction or gangrene   . Anxiety   . Depression   . Allergy     . Unspecified sleep apnea   . Hypertrophy of prostate without urinary obstruction and other lower urinary tract symptoms (LUTS)   . Anemia, unspecified   . Unspecified disorder of kidney and ureter   . Alzheimer's disease   . Chronic maxillary sinusitis     Past Surgical History  Procedure Laterality Date  . Hernia repair    . Eyplosatory lap  2002    Social History:   reports that he quit smoking about 24 years ago. He does not have any smokeless tobacco history on file. He reports that he does not drink alcohol or use illicit drugs.  Family History  Problem Relation Age of Onset  . Pancreatic cancer Sister   . Heart disease Father   . Hypertension Brother     Medications: Patient's Medications  New Prescriptions   No medications on file  Previous Medications   ALBUTEROL (PROVENTIL HFA;VENTOLIN HFA) 108 (90 BASE) MCG/ACT INHALER    Inhale 2 puffs into the lungs every 6 (six) hours as needed for wheezing or shortness of breath.   AMLODIPINE (NORVASC) 10 MG TABLET    Take 10 mg by mouth daily.   ATORVASTATIN (LIPITOR) 20 MG TABLET    Take 1 tablet (20 mg total) by mouth daily.   BLOOD GLUCOSE MONITORING SUPPL (ONE TOUCH ULTRA SYSTEM KIT) W/DEVICE KIT    1 kit by  Does not apply route once. Use as Directed. DX: 250.02   BUDESONIDE-FORMOTEROL (SYMBICORT) 80-4.5 MCG/ACT INHALER    Inhale 2 puffs into the lungs 2 (two) times daily.   FUROSEMIDE (LASIX) 40 MG TABLET    Take 40 mg by mouth daily.   GLIPIZIDE (GLUCOTROL) 5 MG TABLET    Take one tablet once daily to control blood sugar   GLUCOSE BLOOD (BAYER CONTOUR TEST) TEST STRIP    Use as directed to check blood sugar to control diabetes. 250.00   GLUCOSE BLOOD TEST STRIP    One Touch Ultra Test Strips  DX: 250.00, 401.9 Test Blood sugars up to three times daily   HYDRALAZINE (APRESOLINE) 25 MG TABLET    Take 1 tablet (25 mg total) by mouth every 8 (eight) hours.   LINAGLIPTIN (TRADJENTA) 5 MG TABS TABLET    Take 1 tablet (5 mg  total) by mouth daily. To  Control blood sugar   LORATADINE (CLARITIN) 10 MG TABLET    Take 10 mg by mouth daily as needed for allergies.    METOPROLOL (LOPRESSOR) 50 MG TABLET    Take 1 tablet (50 mg total) by mouth 2 (two) times daily.   MOMETASONE (NASONEX) 50 MCG/ACT NASAL SPRAY    Place 2 sprays into the nose daily.   MONTELUKAST (SINGULAIR) 10 MG TABLET    Take 1 tablet (10 mg total) by mouth at bedtime.   PAROXETINE (PAXIL) 20 MG TABLET    TAKE 1 TABLET ONCE DAILY FOR DEPRESSION   TRAZODONE (DESYREL) 150 MG TABLET    Take 150 mg by mouth at bedtime.  Modified Medications   No medications on file  Discontinued Medications   No medications on file     Physical Exam: Filed Vitals:   06/10/13 0956  BP: 136/70  Pulse: 54  Temp: 97.5 F (36.4 C)  TempSrc: Oral  Weight: 216 lb 9.6 oz (98.249 kg)  SpO2: 93%  Physical Exam  Constitutional: He is oriented to person, place, and time. He appears well-developed and well-nourished. No distress.  Obese white male  HENT:  Head: Normocephalic and atraumatic.  Right Ear: External ear normal.  Left Ear: External ear normal.  Right ear with a little wax, left completely clear  Eyes: Pupils are equal, round, and reactive to light.  Wears glasses  Neck: Neck supple.  Cardiovascular: Normal rate, regular rhythm, normal heart sounds and intact distal pulses.   Pulmonary/Chest: Effort normal and breath sounds normal. He has no wheezes. He has no rales.  Abdominal: Soft. Bowel sounds are normal. He exhibits no distension. There is no tenderness.  Abdominal obesity  Musculoskeletal: Normal range of motion. He exhibits edema. He exhibits no tenderness.  Neurological: He is alert and oriented to person, place, and time.  Skin: Skin is warm and dry.  Dry skin on bilateral shins, some excoriations, no rash   Psychiatric: He has a normal mood and affect.    [name] reviewed: Basic Metabolic Panel:  Recent [name]  01/03/13 0615 01/04/13 0600  03/11/13 1045  NA 137 139 140  K 4.2 4.0 4.1  CL 98 100 102  CO2 33* 34* 23  GLUCOSE 172* 173* 135*  BUN 32* 38* 27  CREATININE 1.74* 1.83* 1.60*  CALCIUM 9.9 9.8 10.2   Liver Function Tests:  Recent [name]  12/28/12 1747  AST 14  ALT 14  ALKPHOS 56  BILITOT 0.7  PROT 8.6*  ALBUMIN 4.0   No results found for this basename: LIPASE,  AMYLASE,  in the last 8760 hours No results found for this basename: AMMONIA,  in the last 8760 hours CBC:  Recent [name]  12/30/12 0558 12/31/12 0430 01/01/13 0900 03/11/13 1045  WBC 10.8* 7.7 8.0 7.1  NEUTROABS  --   --   --  3.5  HGB 12.2* 12.3* 12.8* 13.3  HCT 35.6* 36.4* 37.8* 38.8  MCV 92.7 93.1 93.3 92  PLT 132* 133* 132*  --    Lipid Panel:  Recent [name]  03/11/13 1045  HDL 40  LDLCALC 202*  TRIG 125  CHOLHDL 6.7*   Lab Results  Component Value Date   HGBA1C 6.5* 03/11/2013   Assessment/Plan 1. Diabetes mellitus -well controlled as of 9/14 -cont current meds -f/u hba1c today  2. Hyperlipidemia LDL goal < 100 -ate breakfast this am so cannot draw lipids -last lipids well above goal in September, on atorvastatin--encouraged diet and exercise  3. COPD (chronic obstructive pulmonary disease) -well controlled with current therapy -symbicort sample provided due to cost and he and his wife never mailed in the paperwork I did for the discount  4. Seasonal allergic rhinitis -doing well these days, no complaints of congestion or drainage  5. Hypertension bp satisfactory for age  96. Xerosis of skin -dry on front of legs, encouraged lotion without added scents like eucerin  [name]/tests ordered:  Hba1c, bmp Next appt:  4 mos

## 2013-06-11 LAB — BASIC METABOLIC PANEL
BUN/Creatinine Ratio: 13 (ref 10–22)
BUN: 18 mg/dL (ref 8–27)
CO2: 27 mmol/L (ref 18–29)
Calcium: 10.2 mg/dL (ref 8.6–10.2)
Chloride: 99 mmol/L (ref 97–108)
Creatinine, Ser: 1.37 mg/dL — ABNORMAL HIGH (ref 0.76–1.27)
GFR calc Af Amer: 58 mL/min/{1.73_m2} — ABNORMAL LOW (ref >59)
GFR calc non Af Amer: 50 mL/min/{1.73_m2} — ABNORMAL LOW (ref >59)
Glucose: 70 mg/dL (ref 65–99)
Potassium: 4.4 mmol/L (ref 3.5–5.2)
Sodium: 141 mmol/L (ref 134–144)

## 2013-06-11 LAB — HEMOGLOBIN A1C
Est. average glucose Bld gHb Est-mCnc: 140 mg/dL
Hgb A1c MFr Bld: 6.5 % — ABNORMAL HIGH (ref 4.8–5.6)

## 2013-07-28 ENCOUNTER — Other Ambulatory Visit: Payer: Self-pay | Admitting: *Deleted

## 2013-07-28 DIAGNOSIS — J209 Acute bronchitis, unspecified: Secondary | ICD-10-CM

## 2013-07-28 DIAGNOSIS — E785 Hyperlipidemia, unspecified: Secondary | ICD-10-CM

## 2013-07-28 MED ORDER — BUDESONIDE-FORMOTEROL FUMARATE 80-4.5 MCG/ACT IN AERO: 2.0000 | INHALATION_SPRAY | Freq: Two times a day (BID) | RESPIRATORY_TRACT | Status: DC

## 2013-07-28 MED ORDER — ALBUTEROL SULFATE HFA 108 (90 BASE) MCG/ACT IN AERS: 2.0000 | INHALATION_SPRAY | Freq: Four times a day (QID) | RESPIRATORY_TRACT | Status: DC | PRN

## 2013-07-28 MED ORDER — GLUCOSE BLOOD VI STRP: ORAL_STRIP | Status: DC

## 2013-07-28 MED ORDER — ATORVASTATIN CALCIUM 20 MG PO TABS: 20.0000 mg | ORAL_TABLET | Freq: Every day | ORAL | Status: DC

## 2013-07-28 MED ORDER — GLIPIZIDE 5 MG PO TABS: ORAL_TABLET | ORAL | Status: DC

## 2013-07-28 MED ORDER — MONTELUKAST SODIUM 10 MG PO TABS: 10.0000 mg | ORAL_TABLET | Freq: Every day | ORAL | Status: DC

## 2013-07-28 MED ORDER — ONETOUCH ULTRA SYSTEM W/DEVICE KIT: 1.0000 | PACK | Freq: Once | Status: DC

## 2013-07-28 NOTE — Telephone Encounter (Signed)
Patient needs Rx's faxed to [facility]. Faxed and notified patient.

## 2013-09-17 ENCOUNTER — Other Ambulatory Visit: Payer: Self-pay | Admitting: Adult Health

## 2013-09-17 ENCOUNTER — Ambulatory Visit (INDEPENDENT_AMBULATORY_CARE_PROVIDER_SITE_OTHER): Payer: Commercial Managed Care - HMO | Admitting: Internal Medicine

## 2013-09-17 ENCOUNTER — Encounter: Payer: Self-pay | Admitting: Internal Medicine

## 2013-09-17 VITALS — BP 130/64 | HR 55 | Temp 97.8°F | Resp 10 | Wt 214.0 lb

## 2013-09-17 DIAGNOSIS — E785 Hyperlipidemia, unspecified: Secondary | ICD-10-CM

## 2013-09-17 DIAGNOSIS — I10 Essential (primary) hypertension: Secondary | ICD-10-CM

## 2013-09-17 DIAGNOSIS — F32A Depression, unspecified: Secondary | ICD-10-CM

## 2013-09-17 DIAGNOSIS — J4489 Other specified chronic obstructive pulmonary disease: Secondary | ICD-10-CM

## 2013-09-17 DIAGNOSIS — F3289 Other specified depressive episodes: Secondary | ICD-10-CM

## 2013-09-17 DIAGNOSIS — E119 Type 2 diabetes mellitus without complications: Secondary | ICD-10-CM

## 2013-09-17 DIAGNOSIS — F329 Major depressive disorder, single episode, unspecified: Secondary | ICD-10-CM

## 2013-09-17 DIAGNOSIS — E1169 Type 2 diabetes mellitus with other specified complication: Secondary | ICD-10-CM | POA: Insufficient documentation

## 2013-09-17 DIAGNOSIS — J449 Chronic obstructive pulmonary disease, unspecified: Secondary | ICD-10-CM

## 2013-09-17 NOTE — Progress Notes (Signed)
Patient ID: Ian [name], male   DOB: 1936-02-14, 78 y.o.   MRN: [id number]   Location:  [facility]  Code Status: DNR   No Known Allergies  Chief Complaint  Patient presents with  . Follow-up    3 month follow-up on DM     HPI: Patient is a 78 y.o. white male seen in the office today for medical mgt of chronic diseases.  Nothing new.    Breathing is doing well.  Still has the inhaler---uses it only once in 3-4 days, but sometimes even longer.    Still walking for exercise 5 days per week.    Old machine wasn't working properly for 1-2 weeks.  MA checked it here and it didn't work properly so we gave him a new one today.  Sugars 121-145.  Once in a while lower than that.  Occasional 160s.    Review of Systems:  Review of Systems  Constitutional: Negative for fever, chills and malaise/fatigue.  HENT: Negative for congestion.   Eyes: Negative for blurred vision.       Had eye checkup not long ago  Respiratory: Negative for cough and shortness of breath.   Cardiovascular: Positive for leg swelling. Negative for chest pain and palpitations.  Gastrointestinal: Negative for heartburn, abdominal pain and constipation.  Genitourinary: Negative for dysuria.  Musculoskeletal: Negative for falls and myalgias.  Skin: Negative for rash.  Neurological: Negative for dizziness, loss of consciousness, weakness and headaches.  Endo/Heme/Allergies: Does not bruise/bleed easily.  Psychiatric/Behavioral: Positive for memory loss. Negative for depression.     Past Medical History  Diagnosis Date  . Diabetes mellitus   . Hypertension   . COPD (chronic obstructive pulmonary disease)   . Kidney disease     mild  . Hyperlipidemia   . Hypertrophy of prostate without urinary obstruction and other lower urinary tract symptoms (LUTS)   . Hypercalcemia   . Edema   . Ventral hernia, unspecified, without mention of obstruction or gangrene   . Anxiety   .  Depression   . Allergy   . Unspecified sleep apnea   . Hypertrophy of prostate without urinary obstruction and other lower urinary tract symptoms (LUTS)   . Anemia, unspecified   . Unspecified disorder of kidney and ureter   . Alzheimer's disease   . Chronic maxillary sinusitis     Past Surgical History  Procedure Laterality Date  . Hernia repair    . Eyplosatory lap  2002    Social History:   reports that he quit smoking about 25 years ago. He does not have any smokeless tobacco history on file. He reports that he does not drink alcohol or use illicit drugs.  Family History  Problem Relation Age of Onset  . Pancreatic cancer Sister   . Heart disease Father   . Hypertension Brother     Medications: Patient's Medications  New Prescriptions   No medications on file  Previous Medications   ALBUTEROL (PROVENTIL HFA;VENTOLIN HFA) 108 (90 BASE) MCG/ACT INHALER    Inhale 2 puffs into the lungs every 6 (six) hours as needed for wheezing or shortness of breath.   AMLODIPINE (NORVASC) 10 MG TABLET    Take 10 mg by mouth daily.   BLOOD GLUCOSE MONITORING SUPPL (ONE TOUCH ULTRA SYSTEM KIT) W/DEVICE KIT    1 kit by Does not apply route once. Use as Directed. DX: 250.02   BUDESONIDE-FORMOTEROL (SYMBICORT) 80-4.5 MCG/ACT INHALER    Inhale  2 puffs into the lungs 2 (two) times daily.   GLIPIZIDE (GLUCOTROL) 5 MG TABLET    Take one tablet once daily to control blood sugar   GLUCOSE BLOOD TEST STRIP    One Touch Ultra Test Strips Test Blood Sugar up to three times daily Dx 250.00    401.9   METOPROLOL (LOPRESSOR) 50 MG TABLET    Take 1 tablet (50 mg total) by mouth 2 (two) times daily.   PAROXETINE (PAXIL) 20 MG TABLET    TAKE 1 TABLET ONCE DAILY FOR DEPRESSION  Modified Medications   Modified Medication Previous Medication   HYDRALAZINE (APRESOLINE) 25 MG TABLET hydrALAZINE (APRESOLINE) 25 MG tablet      Take 25 mg by mouth 2 (two) times daily.    Take 1 tablet (25 mg total) by mouth every 8  (eight) hours.  Discontinued Medications   ATORVASTATIN (LIPITOR) 20 MG TABLET    Take 1 tablet (20 mg total) by mouth daily.   FUROSEMIDE (LASIX) 40 MG TABLET    Take 40 mg by mouth daily.   GLUCOSE BLOOD (BAYER CONTOUR TEST) TEST STRIP    Use as directed to check blood sugar to control diabetes. 250.00   LINAGLIPTIN (TRADJENTA) 5 MG TABS TABLET    Take 1 tablet (5 mg total) by mouth daily. To  Control blood sugar   LORATADINE (CLARITIN) 10 MG TABLET    Take 10 mg by mouth daily as needed for allergies.    MOMETASONE (NASONEX) 50 MCG/ACT NASAL SPRAY    Place 2 sprays into the nose daily.   MONTELUKAST (SINGULAIR) 10 MG TABLET    Take 1 tablet (10 mg total) by mouth at bedtime.   TRAZODONE (DESYREL) 150 MG TABLET    Take 150 mg by mouth at bedtime.     Physical Exam: Filed Vitals:   09/17/13 0825  BP: 130/64  Pulse: 55  Temp: 97.8 F (36.6 C)  TempSrc: Oral  Resp: 10  Weight: 214 lb (97.07 kg)  SpO2: 96%  Physical Exam  Constitutional: He is oriented to person, place, and time. He appears well-developed and well-nourished. No distress.  Obese white male  HENT:  Head: Normocephalic and atraumatic.  Eyes:  glasses  Cardiovascular: Normal rate, regular rhythm, normal heart sounds and intact distal pulses.   Pulmonary/Chest: Effort normal and breath sounds normal. No respiratory distress. He has no wheezes.  Abdominal: Soft. Bowel sounds are normal. He exhibits no distension and no mass. There is no tenderness.  Musculoskeletal: Normal range of motion. He exhibits no edema and no tenderness.  Diabetic foot exam done  Neurological: He is alert and oriented to person, place, and time.  Skin: Skin is warm and dry.  Psychiatric: He has a normal mood and affect.    [name] reviewed: Basic Metabolic Panel:  Recent [name]  01/04/13 0600 03/11/13 1045 06/10/13 1233  NA 139 140 141  K 4.0 4.1 4.4  CL 100 102 99  CO2 34* 23 27  GLUCOSE 173* 135* 70  BUN 38* 27 18  CREATININE 1.83*  1.60* 1.37*  CALCIUM 9.8 10.2 10.2   Liver Function Tests:  Recent [name]  12/28/12 1747  AST 14  ALT 14  ALKPHOS 56  BILITOT 0.7  PROT 8.6*  ALBUMIN 4.0  CBC:  Recent [name]  12/30/12 0558 12/31/12 0430 01/01/13 0900 03/11/13 1045  WBC 10.8* 7.7 8.0 7.1  NEUTROABS  --   --   --  3.5  HGB 12.2* 12.3* 12.8*  13.3  HCT 35.6* 36.4* 37.8* 38.8  MCV 92.7 93.1 93.3 92  PLT 132* 133* 132*  --    Lipid Panel:  Recent [name]  03/11/13 1045  HDL 40  LDLCALC 202*  TRIG 125  CHOLHDL 6.7*   Lab Results  Component Value Date   HGBA1C 6.5* 06/10/2013    Assessment/Plan 1. COPD (chronic obstructive pulmonary disease) -stable, exercising, lost a little weight, cont current treatment  2. Diabetes mellitus type 2, controlled, without complications -stable with current therapy -diabetic foot exam done today and normal  3. Hyperlipidemia LDL goal < 100 -will need to check lipids before next visit -cont current therapy and diet, exercise  4. Depression -cont paxil--mood is excellent today  5. Benign essential hypertension -bp at goal with current meds  [name]/tests ordered:   Orders Placed This Encounter  Procedures  . CBC with Differential  . Comprehensive metabolic panel  . Hemoglobin A1c  . Basic metabolic panel    Standing Status: Future     Number of Occurrences:      Standing Expiration Date: 05/20/2014  . Hemoglobin A1c    Standing Status: Future     Number of Occurrences:      Standing Expiration Date: 05/20/2014  . Lipid panel    Standing Status: Future     Number of Occurrences:      Standing Expiration Date: 05/20/2014  . DNR (Do Not Resuscitate)    No cpr, defibrillation, intubation, mechanical ventilation    Order Specific Question:  Maintain current active treatments    Answer:  Yes    Order Specific Question:  Do not initiate new interventions    Answer:  Yes   Next appt:  4 mos [name] before

## 2013-09-18 LAB — COMPREHENSIVE METABOLIC PANEL
ALT: 17 IU/L (ref 0–44)
AST: 16 IU/L (ref 0–40)
Albumin/Globulin Ratio: 1.6 (ref 1.1–2.5)
Albumin: 4.1 g/dL (ref 3.5–4.8)
Alkaline Phosphatase: 61 IU/L (ref 39–117)
BUN/Creatinine Ratio: 18 (ref 10–22)
BUN: 29 mg/dL — ABNORMAL HIGH (ref 8–27)
CO2: 25 mmol/L (ref 18–29)
Calcium: 10.1 mg/dL (ref 8.6–10.2)
Chloride: 100 mmol/L (ref 97–108)
Creatinine, Ser: 1.62 mg/dL — ABNORMAL HIGH (ref 0.76–1.27)
GFR calc Af Amer: 47 mL/min/{1.73_m2} — ABNORMAL LOW (ref >59)
GFR calc non Af Amer: 41 mL/min/{1.73_m2} — ABNORMAL LOW (ref >59)
Globulin, Total: 2.5 g/dL (ref 1.5–4.5)
Glucose: 189 mg/dL — ABNORMAL HIGH (ref 65–99)
Potassium: 4.4 mmol/L (ref 3.5–5.2)
Sodium: 142 mmol/L (ref 134–144)
Total Bilirubin: 0.4 mg/dL (ref 0.0–1.2)
Total Protein: 6.6 g/dL (ref 6.0–8.5)

## 2013-09-18 LAB — CBC WITH DIFFERENTIAL/PLATELET
Basophils Absolute: 0.1 10*3/uL (ref 0.0–0.2)
Basos: 1 %
Eos: 3 %
Eosinophils Absolute: 0.2 10*3/uL (ref 0.0–0.4)
HCT: 42 % (ref 37.5–51.0)
Hemoglobin: 14.6 g/dL (ref 12.6–17.7)
Immature Grans (Abs): 0 10*3/uL (ref 0.0–0.1)
Immature Granulocytes: 0 %
Lymphocytes Absolute: 2.3 10*3/uL (ref 0.7–3.1)
Lymphs: 33 %
MCH: 32.1 pg (ref 26.6–33.0)
MCHC: 34.8 g/dL (ref 31.5–35.7)
MCV: 92 fL (ref 79–97)
Monocytes Absolute: 0.7 10*3/uL (ref 0.1–0.9)
Monocytes: 9 %
Neutrophils Absolute: 3.7 10*3/uL (ref 1.4–7.0)
Neutrophils Relative %: 54 %
RBC: 4.55 x10E6/uL (ref 4.14–5.80)
RDW: 14.6 % (ref 12.3–15.4)
WBC: 6.9 10*3/uL (ref 3.4–10.8)

## 2013-09-18 LAB — HEMOGLOBIN A1C
Est. average glucose Bld gHb Est-mCnc: 171 mg/dL
Hgb A1c MFr Bld: 7.6 % — ABNORMAL HIGH (ref 4.8–5.6)

## 2013-09-29 ENCOUNTER — Other Ambulatory Visit: Payer: Self-pay | Admitting: *Deleted

## 2013-09-29 ENCOUNTER — Telehealth: Payer: Self-pay | Admitting: *Deleted

## 2013-09-29 NOTE — Telephone Encounter (Signed)
Patient wants a refill on Trazodone and test strips, but looking in patients chart Trazodone was discontinued on 09/17/2013. Patient wife states he is still taking and needs refill called into pharmacy. Is this ok to add back to medication list? Please Advise.   [facility] #[phone] Fax#: [phone]

## 2013-09-30 NOTE — Telephone Encounter (Signed)
Ok to refill trazodone 50mg  po qhs prn insomnia

## 2013-10-01 ENCOUNTER — Other Ambulatory Visit: Payer: Self-pay | Admitting: *Deleted

## 2013-10-01 MED ORDER — TRAZODONE HCL 50 MG PO TABS: ORAL_TABLET | ORAL | Status: DC

## 2013-10-01 NOTE — Telephone Encounter (Signed)
Patient wife Notified and faxed to [email or web address]

## 2013-10-11 ENCOUNTER — Other Ambulatory Visit: Payer: Self-pay | Admitting: *Deleted

## 2013-10-11 MED ORDER — TRAZODONE HCL 50 MG PO TABS: ORAL_TABLET | ORAL | Status: DC

## 2013-10-20 ENCOUNTER — Ambulatory Visit (INDEPENDENT_AMBULATORY_CARE_PROVIDER_SITE_OTHER): Payer: Commercial Managed Care - HMO | Admitting: Nurse Practitioner

## 2013-10-20 ENCOUNTER — Encounter: Payer: Self-pay | Admitting: Nurse Practitioner

## 2013-10-20 VITALS — BP 140/62 | HR 56 | Temp 97.2°F | Resp 18 | Ht 67.0 in | Wt 210.2 lb

## 2013-10-20 DIAGNOSIS — L821 Other seborrheic keratosis: Secondary | ICD-10-CM

## 2013-10-20 DIAGNOSIS — N189 Chronic kidney disease, unspecified: Secondary | ICD-10-CM | POA: Insufficient documentation

## 2013-10-20 DIAGNOSIS — J449 Chronic obstructive pulmonary disease, unspecified: Secondary | ICD-10-CM

## 2013-10-20 DIAGNOSIS — E119 Type 2 diabetes mellitus without complications: Secondary | ICD-10-CM

## 2013-10-20 DIAGNOSIS — N184 Chronic kidney disease, stage 4 (severe): Secondary | ICD-10-CM | POA: Insufficient documentation

## 2013-10-20 NOTE — Patient Instructions (Signed)
Referrals made to Dr Halford Chessman, Dr Posey Pronto and eye doctor--- call us with name of doctor and practice name  Seborrheic Keratosis Seborrheic keratosis is a common, noncancerous (benign) skin growth that can occur anywhere on the skin.It looks like "stuck-on," waxy, rough, tan, brown, or black spots on the skin. These skin growths can be flat or raised.They are often called "barnacles" because of their pasted-on appearance.Usually, these skin growths appear in adulthood, around age 44, and increase in number as you age. They may also develop during pregnancy or following estrogen therapy. Many people may only have one growth appear in their lifetime, while some people may develop many growths. CAUSES It is unknown what causes these skin growths, but they appear to run in families. SYMPTOMS Seborrheic keratosis is often located on the face, chest, shoulders, back, or other areas. These growths are:  Usually painless, but may become irritated and itchy.  Yellow, brown, black, or other colors.  Slightly raised or have a flat surface.  Sometimes rough or wart-like in texture.  Often waxy on the surface.  Round or oval-shaped.  Sometimes "stuck-on" in appearance.  Sometimes single, but there are usually many growths. Any growth that bleeds, itches on a regular basis, becomes inflamed, or becomes irritated needs to be evaluated by a skin specialist (dermatologist). DIAGNOSIS Diagnosis is mainly based on the way the growths appear. In some cases, it can be difficult to tell this type of skin growth from skin cancer. A skin growth tissue sample (biopsy) may be used to confirm the diagnosis. TREATMENT Most often, treatment is not needed because the skin growths are benign.If the skin growth is irritated easily by clothing or jewelry, causing it to scab or bleed, treatment may be recommended. Patients may also choose to have the growths removed because they do not like their appearance. Most commonly,  these growths are treated with cryosurgery. In cryosurgery, liquid nitrogen is applied to "freeze" the growth. The growth usually falls off within a matter of days. A blister may form and dry into a scab that will also fall off. After the growth or scab falls off, it may leave a dark or light spot on the skin. This color may fade over time, or it may remain permanent on the skin. HOME CARE INSTRUCTIONS If the skin growths are treated with cryosurgery, the treated area needs to be kept clean with water and soap. SEEK MEDICAL CARE IF:  You have questions about these growths or other skin problems.  You develop new symptoms, including:  A change in the appearance of the skin growth.  New growths.  Any bleeding, itching, or pain in the growths.  A skin growth that looks similar to seborrheic keratosis. Document Released: 07/27/2010 Document Revised: 09/16/2011 Document Reviewed: 07/27/2010 [facility] Patient Information 2014 [place], [place].

## 2013-10-20 NOTE — Progress Notes (Signed)
Patient ID: Ian [name], male   DOB: [birth year], 78 y.o.   MRN: [id number]    No Known Allergies  Chief Complaint  Patient presents with  . Acute Visit    concerned about moles on back    HPI: Patient is a 78 y.o.  Male seen in the office today for evaluation of 2 moles on his back that his wife noticed, she does not look back there often and wanted to have them evaluated  Also needs referral due to insurance Review of Systems:  Review of Systems  Constitutional: Negative for fever, chills and malaise/fatigue.  HENT: Negative for congestion.   Eyes: Negative for blurred vision.       Sees eye doctor routinely  Respiratory: Negative for cough and shortness of breath.        Following with pulmonary due to COPD  Cardiovascular: Negative for chest pain and palpitations.  Gastrointestinal: Negative for heartburn, abdominal pain and constipation.  Genitourinary: Negative for dysuria.       Needs referral to see nephrologist   Musculoskeletal: Negative for falls and myalgias.  Skin: Negative for rash.  Neurological: Negative for dizziness, loss of consciousness, weakness and headaches.  Endo/Heme/Allergies: Does not bruise/bleed easily.  Psychiatric/Behavioral: Positive for memory loss. Negative for depression.     Past Medical History  Diagnosis Date  . Diabetes mellitus   . Hypertension   . COPD (chronic obstructive pulmonary disease)   . Kidney disease     mild  . Hyperlipidemia   . Hypertrophy of prostate without urinary obstruction and other lower urinary tract symptoms (LUTS)   . Hypercalcemia   . Edema   . Ventral hernia, unspecified, without mention of obstruction or gangrene   . Anxiety   . Depression   . Allergy   . Unspecified sleep apnea   . Hypertrophy of prostate without urinary obstruction and other lower urinary tract symptoms (LUTS)   . Anemia, unspecified   . Unspecified disorder of kidney and ureter   . Alzheimer's disease   . Chronic maxillary  sinusitis    Past Surgical History  Procedure Laterality Date  . Hernia repair    . Eyplosatory lap  2002   Social History:   reports that he quit smoking about 25 years ago. He does not have any smokeless tobacco history on file. He reports that he does not drink alcohol or use illicit drugs.  Family History  Problem Relation Age of Onset  . Pancreatic cancer Sister   . Heart disease Father   . Hypertension Brother     Medications: Patient's Medications  New Prescriptions   No medications on file  Previous Medications   ALBUTEROL (PROVENTIL HFA;VENTOLIN HFA) 108 (90 BASE) MCG/ACT INHALER    Inhale 2 puffs into the lungs every 6 (six) hours as needed for wheezing or shortness of breath.   AMLODIPINE (NORVASC) 10 MG TABLET    Take 10 mg by mouth daily.   ATORVASTATIN (LIPITOR) 10 MG TABLET    Take 10 mg by mouth at bedtime.   BLOOD GLUCOSE MONITORING SUPPL (ONE TOUCH ULTRA SYSTEM KIT) W/DEVICE KIT    1 kit by Does not apply route once. Use as Directed. DX: 250.02   BUDESONIDE-FORMOTEROL (SYMBICORT) 80-4.5 MCG/ACT INHALER    Inhale 2 puffs into the lungs 2 (two) times daily.   GLIPIZIDE (GLUCOTROL) 5 MG TABLET    Take one tablet once daily to control blood sugar   GLUCOSE BLOOD TEST STRIP    One  Touch Ultra Test Strips Test Blood Sugar up to three times daily Dx 250.00    401.9   HYDRALAZINE (APRESOLINE) 25 MG TABLET    Take 25 mg by mouth 2 (two) times daily.   METOPROLOL (LOPRESSOR) 50 MG TABLET    Take 1 tablet (50 mg total) by mouth 2 (two) times daily.   PAROXETINE (PAXIL) 20 MG TABLET    TAKE 1 TABLET ONCE DAILY FOR DEPRESSION   TRAZODONE (DESYREL) 50 MG TABLET    Take one tablet by mouth at bedtime as needed for insomnia  Modified Medications   No medications on file  Discontinued Medications   No medications on file     Physical Exam:  Filed Vitals:   10/20/13 1300  BP: 140/62  Pulse: 56  Temp: 97.2 F (36.2 C)  TempSrc: Oral  Resp: 18  Height: 5' 7"  (1.702 m)    Weight: 210 lb 3.2 oz (95.346 kg)  SpO2: 93%    Physical Exam  Constitutional: He is well-developed, well-nourished, and in no distress.  Cardiovascular: Normal rate, regular rhythm and normal heart sounds.   Pulmonary/Chest: Effort normal and breath sounds normal. No respiratory distress.  Abdominal: Soft. Bowel sounds are normal.  Neurological: He is alert.  Skin: Skin is warm and dry.  2 moles-- Seborrheic keratosis to back noted   Psychiatric: Affect normal.     [name] reviewed: Basic Metabolic Panel:  Recent [name]  03/11/13 1045 06/10/13 1233 09/17/13 0907  NA 140 141 142  K 4.1 4.4 4.4  CL 102 99 100  CO2 23 27 25   GLUCOSE 135* 70 189*  BUN 27 18 29*  CREATININE 1.60* 1.37* 1.62*  CALCIUM 10.2 10.2 10.1   Liver Function Tests:  Recent [name]  12/28/12 1747 09/17/13 0907  AST 14 16  ALT 14 17  ALKPHOS 56 61  BILITOT 0.7 0.4  PROT 8.6* 6.6  ALBUMIN 4.0  --    No results found for this basename: LIPASE, AMYLASE,  in the last 8760 hours No results found for this basename: AMMONIA,  in the last 8760 hours CBC:  Recent [name]  12/30/12 0558 12/31/12 0430 01/01/13 0900 03/11/13 1045 09/17/13 0907  WBC 10.8* 7.7 8.0 7.1 6.9  NEUTROABS  --   --   --  3.5 3.7  HGB 12.2* 12.3* 12.8* 13.3 14.6  HCT 35.6* 36.4* 37.8* 38.8 42.0  MCV 92.7 93.1 93.3 92 92  PLT 132* 133* 132*  --   --    Lipid Panel:  Recent [name]  03/11/13 1045  HDL 40  LDLCALC 202*  TRIG 125  CHOLHDL 6.7*   TSH: No results found for this basename: TSH,  in the last 8760 hours A1C: Lab Results  Component Value Date   HGBA1C 7.6* 09/17/2013     Assessment/Plan 1. Seborrheic keratosis -monitor   2. Diabetes mellitus -trying to get blood sugars under better control  - Ambulatory referral to Ophthalmology due to DM and routine eye exams, needs new referral   3. COPD (chronic obstructive pulmonary disease) -established with Dr Halford Chessman but due to insurance needs new referral -  Ambulatory referral to Pulmonology  4. Chronic kidney disease (CKD) -established and following with Dr Posey Pronto but due to insurance needs referral   - Ambulatory referral to Nephrology

## 2013-10-27 ENCOUNTER — Other Ambulatory Visit: Payer: Self-pay | Admitting: *Deleted

## 2013-10-27 MED ORDER — HYDRALAZINE HCL 25 MG PO TABS: 25.0000 mg | ORAL_TABLET | Freq: Two times a day (BID) | ORAL | Status: DC

## 2013-10-27 MED ORDER — AMLODIPINE BESYLATE 10 MG PO TABS: 10.0000 mg | ORAL_TABLET | Freq: Every day | ORAL | Status: DC

## 2013-10-27 MED ORDER — METOPROLOL TARTRATE 50 MG PO TABS: 50.0000 mg | ORAL_TABLET | Freq: Two times a day (BID) | ORAL | Status: DC

## 2013-10-27 NOTE — Telephone Encounter (Signed)
[facility]

## 2013-10-27 NOTE — Telephone Encounter (Signed)
[facility]

## 2013-11-16 ENCOUNTER — Other Ambulatory Visit: Payer: Self-pay | Admitting: *Deleted

## 2013-11-16 MED ORDER — PAROXETINE HCL 20 MG PO TABS: ORAL_TABLET | ORAL | Status: DC

## 2013-11-16 NOTE — Telephone Encounter (Signed)
Patient requested Rx to be faxed to [facility]

## 2013-11-17 ENCOUNTER — Encounter (INDEPENDENT_AMBULATORY_CARE_PROVIDER_SITE_OTHER): Payer: Self-pay

## 2013-11-17 ENCOUNTER — Ambulatory Visit (INDEPENDENT_AMBULATORY_CARE_PROVIDER_SITE_OTHER): Payer: Commercial Managed Care - HMO | Admitting: Pulmonary Disease

## 2013-11-17 ENCOUNTER — Encounter: Payer: Self-pay | Admitting: Pulmonary Disease

## 2013-11-17 VITALS — BP 124/76 | HR 54 | Ht 67.0 in | Wt 210.0 lb

## 2013-11-17 DIAGNOSIS — J449 Chronic obstructive pulmonary disease, unspecified: Secondary | ICD-10-CM

## 2013-11-17 DIAGNOSIS — J302 Other seasonal allergic rhinitis: Secondary | ICD-10-CM

## 2013-11-17 DIAGNOSIS — J309 Allergic rhinitis, unspecified: Secondary | ICD-10-CM

## 2013-11-17 NOTE — Assessment & Plan Note (Signed)
He can use OTC anti-histamine as needed.

## 2013-11-17 NOTE — Progress Notes (Signed)
Chief Complaint  Patient presents with  . Follow-up    Breathing is doing well. Denies chest tightness, SOB or coughing.    History of Present Illness: Ian [name] is a 78 y.o. male former smoker with chronic cough from COPD/asthma, and post-nasal drip.  He has been doing well.  He does not have cough, wheeze, or sputum usually.  He is not having issues with his sinuses or post nasal drip.  He uses albuterol once or twice per week.  He uses OTC anti-histamines occasionally.  TESTS: PFT 11/29/11>>FEV1 1.71 (67%), FEV1% 64, TLC 5.29 (92%), DLCO 84%, +BD CT chest 12/29/12 >> lingular PNA  Ian [name]  has a past medical history of Diabetes mellitus; Hypertension; COPD (chronic obstructive pulmonary disease); Kidney disease; Hyperlipidemia; Hypertrophy of prostate without urinary obstruction and other lower urinary tract symptoms (LUTS); Hypercalcemia; Edema; Ventral hernia, unspecified, without mention of obstruction or gangrene; Anxiety; Depression; Allergy; Unspecified sleep apnea; Hypertrophy of prostate without urinary obstruction and other lower urinary tract symptoms (LUTS); Anemia, unspecified; Unspecified disorder of kidney and ureter; Alzheimer's disease; and Chronic maxillary sinusitis.  Ian [name]  has past surgical history that includes Hernia repair and eyplosatory lap (2002).  Prior to Admission medications   Medication Sig Start Date End Date Taking? Authorizing Provider  albuterol (PROVENTIL HFA;VENTOLIN HFA) 108 (90 BASE) MCG/ACT inhaler Inhale 2 puffs into the lungs every 6 (six) hours as needed.   Yes Historical Provider, MD  amLODipine (NORVASC) 10 MG tablet Take 10 mg by mouth daily.   Yes Historical Provider, MD  BAYER CONTOUR TEST test strip 1 strip Daily. 11/20/11  Yes Historical Provider, MD  budesonide-formoterol (SYMBICORT) 80-4.5 MCG/ACT inhaler Inhale 2 puffs into the lungs 2 (two) times daily.   Yes Historical Provider, MD  cetirizine (ZYRTEC) 10 MG tablet Take 1  tablet (10 mg total) by mouth daily. One tab daily for allergies 10/28/11 10/27/12 Yes Billy Fischer, MD  Fenofibric Acid 35 MG TABS Take 35 mg by mouth daily.   Yes Historical Provider, MD  glimepiride (AMARYL) 4 MG tablet Take 4 mg by mouth daily before breakfast.   Yes Historical Provider, MD  lisinopril (PRINIVIL,ZESTRIL) 5 MG tablet Take 5 mg by mouth daily.   Yes Historical Provider, MD  loratadine (CLARITIN) 10 MG tablet Take 10 mg by mouth daily as needed.   Yes Historical Provider, MD  metoprolol tartrate (LOPRESSOR) 25 MG tablet Take 25 mg by mouth 2 (two) times daily.    Yes Historical Provider, MD  mometasone (NASONEX) 50 MCG/ACT nasal spray Place 2 sprays into the nose daily as needed (allergies). 06/10/12 06/10/13 Yes Chesley Mires, MD  PARoxetine (PAXIL) 20 MG tablet Take 20 mg by mouth every morning.   Yes Historical Provider, MD  traZODone (DESYREL) 150 MG tablet Take 150 mg by mouth at bedtime.   Yes Historical Provider, MD    No Known Allergies   Physical Exam:  General - No distress ENT - No sinus tenderness, no oral exudate Cardiac - s1s2 regular, no murmur Chest - No wheeze/rales/dullness Back - No focal tenderness Abd - Soft, non-tender Ext - No edema Neuro - Normal strength Skin - No rashes Psych - normal mood, and behavior   Assessment/Plan:  Chesley Mires, MD [facility] Pager:  [phone]

## 2013-11-17 NOTE — Patient Instructions (Signed)
Follow up in 1 year.

## 2013-11-17 NOTE — Assessment & Plan Note (Signed)
Stable on symbicort and prn albuterol.  

## 2013-12-01 ENCOUNTER — Ambulatory Visit (INDEPENDENT_AMBULATORY_CARE_PROVIDER_SITE_OTHER): Payer: Commercial Managed Care - HMO | Admitting: Nurse Practitioner

## 2013-12-01 ENCOUNTER — Encounter: Payer: Self-pay | Admitting: Nurse Practitioner

## 2013-12-01 VITALS — BP 138/60 | HR 53 | Temp 98.2°F | Resp 18 | Ht 67.0 in | Wt 209.8 lb

## 2013-12-01 DIAGNOSIS — E119 Type 2 diabetes mellitus without complications: Secondary | ICD-10-CM

## 2013-12-01 NOTE — Progress Notes (Signed)
Patient ID: Ian [name], male   DOB: 06/23/1936, 78 y.o.   MRN: [id number]    No Known Allergies  Chief Complaint  Patient presents with  . Acute Visit    BS concerns    HPI: Patient is a 78 y.o. male seen in the office today for blood sugar review At the end of March pt reports blood sugars were dropping into the 40s- after he had eaten. Did not change anything (no medication or diet changes) trying to eat better. Now blood sugars are higher, trying to do better but still having sweets at times. Wife is worried because blood sugars are "creeping back up" Fasting is ranging from 70s- 140s and after meals ranging from 130-180, occasional over 200 Goes to see nephrologist tomorrow for routine follow up Review of Systems:  Review of Systems  Constitutional: Negative for fever, chills and malaise/fatigue.  Eyes: Negative for blurred vision.  Respiratory: Negative for shortness of breath.   Cardiovascular: Positive for leg swelling. Negative for chest pain and palpitations.  Gastrointestinal: Negative for heartburn, abdominal pain and constipation.  Genitourinary: Negative for dysuria, urgency and frequency.  Skin: Negative for rash.  Neurological: Negative for dizziness, tingling, weakness and headaches.  Endo/Heme/Allergies: Negative for polydipsia. Does not bruise/bleed easily.     Past Medical History  Diagnosis Date  . Diabetes mellitus   . Hypertension   . COPD (chronic obstructive pulmonary disease)   . Kidney disease     mild  . Hyperlipidemia   . Hypertrophy of prostate without urinary obstruction and other lower urinary tract symptoms (LUTS)   . Hypercalcemia   . Edema   . Ventral hernia, unspecified, without mention of obstruction or gangrene   . Anxiety   . Depression   . Allergy   . Unspecified sleep apnea   . Hypertrophy of prostate without urinary obstruction and other lower urinary tract symptoms (LUTS)   . Anemia, unspecified   . Unspecified disorder of kidney  and ureter   . Alzheimer's disease   . Chronic maxillary sinusitis    Past Surgical History  Procedure Laterality Date  . Hernia repair    . Eyplosatory lap  2002   Social History:   reports that he quit smoking about 25 years ago. He does not have any smokeless tobacco history on file. He reports that he does not drink alcohol or use illicit drugs.  Family History  Problem Relation Age of Onset  . Pancreatic cancer Sister   . Heart disease Father   . Hypertension Brother     Medications: Patient's Medications  New Prescriptions   No medications on file  Previous Medications   ALBUTEROL (PROVENTIL HFA;VENTOLIN HFA) 108 (90 BASE) MCG/ACT INHALER    Inhale 2 puffs into the lungs every 6 (six) hours as needed for wheezing or shortness of breath.   AMLODIPINE (NORVASC) 10 MG TABLET    Take 1 tablet (10 mg total) by mouth daily.   ATORVASTATIN (LIPITOR) 10 MG TABLET    Take 10 mg by mouth at bedtime.   BLOOD GLUCOSE MONITORING SUPPL (ONE TOUCH ULTRA SYSTEM KIT) W/DEVICE KIT    1 kit by Does not apply route once. Use as Directed. DX: 250.02   BUDESONIDE-FORMOTEROL (SYMBICORT) 80-4.5 MCG/ACT INHALER    Inhale 2 puffs into the lungs 2 (two) times daily.   GLIPIZIDE (GLUCOTROL) 5 MG TABLET    Take one tablet once daily to control blood sugar   GLUCOSE BLOOD TEST STRIP  One Touch Ultra Test Strips Test Blood Sugar up to three times daily Dx 250.00    401.9   HYDRALAZINE (APRESOLINE) 25 MG TABLET    Take 1 tablet (25 mg total) by mouth 2 (two) times daily.   METOPROLOL (LOPRESSOR) 50 MG TABLET    Take 1 tablet (50 mg total) by mouth 2 (two) times daily.   MONTELUKAST (SINGULAIR) 10 MG TABLET    Take 10 mg by mouth at bedtime.   PAROXETINE (PAXIL) 20 MG TABLET    Take one tablet by mouth once daily for depression   TRAZODONE (DESYREL) 50 MG TABLET    Take one tablet by mouth at bedtime as needed for insomnia  Modified Medications   No medications on file  Discontinued Medications   No  medications on file     Physical Exam:  Filed Vitals:   12/01/13 1018  BP: 138/60  Pulse: 53  Temp: 98.2 F (36.8 C)  TempSrc: Oral  Resp: 18  Height: 5' 7"  (1.702 m)  Weight: 209 lb 12.8 oz (95.165 kg)  SpO2: 93%   Physical Exam  Constitutional: He is oriented to person, place, and time and well-developed, well-nourished, and in no distress.  HENT:  Mouth/Throat: Oropharynx is clear and moist. No oropharyngeal exudate.  Eyes: Conjunctivae and EOM are normal. Pupils are equal, round, and reactive to light.  Neck: Normal range of motion. Neck supple.  Cardiovascular: Normal rate, regular rhythm and normal heart sounds.   Pulmonary/Chest: Effort normal and breath sounds normal.  Abdominal: Soft. Bowel sounds are normal.  Musculoskeletal: He exhibits no edema.  Neurological: He is alert and oriented to person, place, and time.  Skin: Skin is warm and dry.  Psychiatric: Affect normal.     [name] reviewed: Basic Metabolic Panel:  Recent [name]  03/11/13 1045 06/10/13 1233 09/17/13 0907  NA 140 141 142  K 4.1 4.4 4.4  CL 102 99 100  CO2 23 27 25   GLUCOSE 135* 70 189*  BUN 27 18 29*  CREATININE 1.60* 1.37* 1.62*  CALCIUM 10.2 10.2 10.1   Liver Function Tests:  Recent [name]  12/28/12 1747 09/17/13 0907  AST 14 16  ALT 14 17  ALKPHOS 56 61  BILITOT 0.7 0.4  PROT 8.6* 6.6  ALBUMIN 4.0  --    No results found for this basename: LIPASE, AMYLASE,  in the last 8760 hours No results found for this basename: AMMONIA,  in the last 8760 hours CBC:  Recent [name]  12/30/12 0558 12/31/12 0430 01/01/13 0900 03/11/13 1045 09/17/13 0907  WBC 10.8* 7.7 8.0 7.1 6.9  NEUTROABS  --   --   --  3.5 3.7  HGB 12.2* 12.3* 12.8* 13.3 14.6  HCT 35.6* 36.4* 37.8* 38.8 42.0  MCV 92.7 93.1 93.3 92 92  PLT 132* 133* 132*  --   --    Lipid Panel:  Recent [name]  03/11/13 1045  HDL 40  LDLCALC 202*  TRIG 125  CHOLHDL 6.7*   TSH: No results found for this basename: TSH,  in  the last 8760 hours A1C: Lab Results  Component Value Date   HGBA1C 7.6* 09/17/2013    Assessment/Plan  1. Diabetes mellitus -overall blood sugars are in appropriate range -discussed goal and blood sugar ranges with pt and wife -provided dietary guidance and information given  -to cut back on simple carbs, candies and cake -eat foods high in protein and complex carb  -cont current glipizide To keep current follow  up appt with Mariea Clonts, DO -also can meet with [facility] as needed if needing more information regarding diet and questions regarding medications

## 2013-12-10 ENCOUNTER — Encounter: Payer: Self-pay | Admitting: Internal Medicine

## 2013-12-13 ENCOUNTER — Ambulatory Visit (INDEPENDENT_AMBULATORY_CARE_PROVIDER_SITE_OTHER): Payer: Commercial Managed Care - HMO | Admitting: Pharmacotherapy

## 2013-12-13 ENCOUNTER — Encounter: Payer: Self-pay | Admitting: Pharmacotherapy

## 2013-12-13 VITALS — BP 142/76 | HR 56 | Temp 98.2°F | Wt 210.0 lb

## 2013-12-13 DIAGNOSIS — N189 Chronic kidney disease, unspecified: Secondary | ICD-10-CM

## 2013-12-13 DIAGNOSIS — I10 Essential (primary) hypertension: Secondary | ICD-10-CM

## 2013-12-13 DIAGNOSIS — E119 Type 2 diabetes mellitus without complications: Secondary | ICD-10-CM

## 2013-12-13 NOTE — Patient Instructions (Signed)
Stop glipizide Start Tradjenta 5mg  daily.

## 2013-12-13 NOTE — Progress Notes (Signed)
  Subjective:    Ian [name] is a 78 y.o.white male who presents for follow-up of Type 2 diabetes mellitus.  Concerned about recent fluctuations. Forgot to bring blood glucose meter. Self reports BG:  76-289.  He reports average BG:  136m/dl  Denies skipping meals. Trying to eat small portions. Walking some for exercise.  Goes to gym 5 days per week.  Uses machines. Denies problems with feet. Wears corrective lenses.  Denies problems with vision.  Eye exam was 6 months ago. Nocturia 2-3 times per week. Some peripheral edema. Hard of hearing - wears hearing aides. Kidney function is at 40%.  He is taking atorvastatin. No ADR noted. He is only taking glipizide for DM.  He took metformin in the past.  Was stopped due to renal dysfunction.  Review of Systems A comprehensive review of systems was negative except for: Eyes: positive for contacts/glasses Ears, nose, mouth, throat, and face: positive for hearing loss Cardiovascular: positive for lower extremity edema Genitourinary: positive for nocturia    Objective:    BP 142/76  Pulse 56  Temp(Src) 98.2 F (36.8 C) (Oral)  Wt 210 lb (95.255 kg)  General:  alert, cooperative and no distress  Oropharynx: normal findings: lips normal without lesions and gums healthy   Eyes:  negative findings: lids and lashes normal and conjunctivae and sclerae normal   Ears:  external ears normal        Lung: clear to auscultation bilaterally  Heart:  regular rate and rhythm     Extremities: edema bilateral lower extermemties  Skin: warm and dry, no hyperpigmentation, vitiligo, or suspicious lesions     Neuro: mental status, speech normal, alert and oriented x3 and gait and station normal   Lab Review Glucose (mg/dL)  Date Value  09/17/2013 189*  06/10/2013 70   03/11/2013 135*     Glucose, Bld (mg/dL)  Date Value  01/04/2013 173*  01/03/2013 172*  01/02/2013 214*     CO2 (mmol/L)  Date Value  09/17/2013 25   06/10/2013 27   03/11/2013  23      BUN (mg/dL)  Date Value  09/17/2013 29*  06/10/2013 18   03/11/2013 27   01/04/2013 38*  01/03/2013 32*  01/02/2013 37*     Creatinine, Ser (mg/dL)  Date Value  09/17/2013 1.62*  06/10/2013 1.37*  03/11/2013 1.60*    09/17/13: A1C:  7.6%  03/11/13: Total cholesterol: 267 Triglycerides:  125 LDL:  202 HDL:  40  Assessment:    Diabetes Mellitus type II, under good control.  BP at goal <140/90 LDL too high.  Is taking atorvastatin.   Plan:    1.  Rx changes: stop glipizide due to hypoglycemia.  Start Tradjenta 521mdaily.  Counseled on risk/benefit.  Explained tthat Tradjenta was more expesive than glipizide. 2.  Not a candidate for metformin due to renal dysfunction. 3.  Counseled on meal planning and portion control. 4.  Praised current exercise efforts.  Explained benefit of routine exercise. 5.  Discussed available RX for treatment of DM2. 6.  BP at goal.  Continue amlodipine, metoprolol, hydralazine. 7.  LDL too high.  Continue atorvastatin.  Needs to have fasting lipids rechecked.

## 2014-01-18 ENCOUNTER — Other Ambulatory Visit: Payer: Commercial Managed Care - HMO

## 2014-01-18 DIAGNOSIS — I10 Essential (primary) hypertension: Secondary | ICD-10-CM

## 2014-01-18 DIAGNOSIS — E785 Hyperlipidemia, unspecified: Secondary | ICD-10-CM

## 2014-01-18 DIAGNOSIS — E119 Type 2 diabetes mellitus without complications: Secondary | ICD-10-CM

## 2014-01-19 LAB — LIPID PANEL
Chol/HDL Ratio: 5 ratio units (ref 0.0–5.0)
Cholesterol, Total: 185 mg/dL (ref 100–199)
HDL: 37 mg/dL — ABNORMAL LOW (ref >39)
LDL Calculated: 125 mg/dL — ABNORMAL HIGH (ref 0–99)
Triglycerides: 116 mg/dL (ref 0–149)
VLDL Cholesterol Cal: 23 mg/dL (ref 5–40)

## 2014-01-19 LAB — BASIC METABOLIC PANEL
BUN/Creatinine Ratio: 17 (ref 10–22)
BUN: 24 mg/dL (ref 8–27)
CO2: 26 mmol/L (ref 18–29)
Calcium: 10 mg/dL (ref 8.6–10.2)
Chloride: 105 mmol/L (ref 97–108)
Creatinine, Ser: 1.45 mg/dL — ABNORMAL HIGH (ref 0.76–1.27)
GFR calc Af Amer: 54 mL/min/{1.73_m2} — ABNORMAL LOW (ref >59)
GFR calc non Af Amer: 46 mL/min/{1.73_m2} — ABNORMAL LOW (ref >59)
Glucose: 154 mg/dL — ABNORMAL HIGH (ref 65–99)
Potassium: 4.5 mmol/L (ref 3.5–5.2)
Sodium: 142 mmol/L (ref 134–144)

## 2014-01-19 LAB — HEMOGLOBIN A1C
Est. average glucose Bld gHb Est-mCnc: 140 mg/dL
Hgb A1c MFr Bld: 6.5 % — ABNORMAL HIGH (ref 4.8–5.6)

## 2014-01-20 ENCOUNTER — Ambulatory Visit: Payer: Commercial Managed Care - HMO | Admitting: Internal Medicine

## 2014-02-03 ENCOUNTER — Ambulatory Visit (INDEPENDENT_AMBULATORY_CARE_PROVIDER_SITE_OTHER): Payer: Commercial Managed Care - HMO | Admitting: Internal Medicine

## 2014-02-03 ENCOUNTER — Encounter: Payer: Self-pay | Admitting: Internal Medicine

## 2014-02-03 VITALS — BP 118/70 | HR 55 | Temp 97.7°F | Wt 208.4 lb

## 2014-02-03 DIAGNOSIS — E1169 Type 2 diabetes mellitus with other specified complication: Secondary | ICD-10-CM

## 2014-02-03 DIAGNOSIS — F3289 Other specified depressive episodes: Secondary | ICD-10-CM

## 2014-02-03 DIAGNOSIS — J4489 Other specified chronic obstructive pulmonary disease: Secondary | ICD-10-CM

## 2014-02-03 DIAGNOSIS — F329 Major depressive disorder, single episode, unspecified: Secondary | ICD-10-CM

## 2014-02-03 DIAGNOSIS — J449 Chronic obstructive pulmonary disease, unspecified: Secondary | ICD-10-CM

## 2014-02-03 DIAGNOSIS — F32A Depression, unspecified: Secondary | ICD-10-CM

## 2014-02-03 DIAGNOSIS — E1129 Type 2 diabetes mellitus with other diabetic kidney complication: Secondary | ICD-10-CM

## 2014-02-03 DIAGNOSIS — F0280 Dementia in other diseases classified elsewhere without behavioral disturbance: Secondary | ICD-10-CM

## 2014-02-03 DIAGNOSIS — G309 Alzheimer's disease, unspecified: Secondary | ICD-10-CM

## 2014-02-03 DIAGNOSIS — E669 Obesity, unspecified: Secondary | ICD-10-CM

## 2014-02-03 DIAGNOSIS — I10 Essential (primary) hypertension: Secondary | ICD-10-CM

## 2014-02-03 DIAGNOSIS — N189 Chronic kidney disease, unspecified: Secondary | ICD-10-CM

## 2014-02-03 DIAGNOSIS — E785 Hyperlipidemia, unspecified: Secondary | ICD-10-CM

## 2014-02-03 DIAGNOSIS — E1122 Type 2 diabetes mellitus with diabetic chronic kidney disease: Secondary | ICD-10-CM

## 2014-02-03 MED ORDER — LINAGLIPTIN 5 MG PO TABS: 5.0000 mg | ORAL_TABLET | Freq: Every day | ORAL | Status: DC

## 2014-02-03 NOTE — Progress Notes (Signed)
Patient ID: Ian [name], male   DOB: 1935/12/26, 78 y.o.   MRN: [id number]   Location:  [facility] [place] [facility]  Code Status: DNR, has living will and hcpoa that they will bring copies of next time  No Known Allergies  Chief Complaint  Patient presents with  . Follow-up    4 month f/u, discuss [name], and genetic test results    HPI: Patient is a 78 y.o. white male seen in the office today for medical mgt chronic diseases.    Is doing well.  No concerns per patient.  His wife is along today.  Sugars reviewed.  No lows.  Has been running 110s-160s max.    Mostly behaving with his diet.  Have to avoid purchasing sweets.  Going to gym--uses machines and walks.  45 mins walking, machines, then walks again in the afternoons.   No difficulty sleeping.  Went to eye doctor a few weeks ago with new prescription given.    Did use inhaler this am.  Probably uses about 1x per week.  Doesn't even use two puffs every time.  Review of Systems:  Review of Systems  Constitutional: Negative for fever.  HENT: Negative for congestion.   Eyes: Negative for blurred vision.  Respiratory: Negative for shortness of breath.   Cardiovascular: Negative for chest pain and leg swelling.  Gastrointestinal: Negative for heartburn, constipation, blood in stool and melena.  Genitourinary: Negative for dysuria, urgency and frequency.       Up 2-3 times at night  Musculoskeletal: Negative for back pain, falls, joint pain and myalgias.  Skin: Negative for rash.  Neurological: Negative for dizziness, sensory change, loss of consciousness and headaches.  Endo/Heme/Allergies: Does not bruise/bleed easily.  Psychiatric/Behavioral: Positive for memory loss. Negative for depression. The patient does not have insomnia.      Past Medical History  Diagnosis Date  . Diabetes mellitus   . Hypertension   . COPD (chronic obstructive pulmonary disease)   . Kidney disease     mild  .  Hyperlipidemia   . Hypertrophy of prostate without urinary obstruction and other lower urinary tract symptoms (LUTS)   . Hypercalcemia   . Edema   . Ventral hernia, unspecified, without mention of obstruction or gangrene   . Anxiety   . Depression   . Allergy   . Unspecified sleep apnea   . Hypertrophy of prostate without urinary obstruction and other lower urinary tract symptoms (LUTS)   . Anemia, unspecified   . Unspecified disorder of kidney and ureter   . Alzheimer's disease   . Chronic maxillary sinusitis     Past Surgical History  Procedure Laterality Date  . Hernia repair    . Eyplosatory lap  2002    Social History:   reports that he quit smoking about 25 years ago. He has never used smokeless tobacco. He reports that he does not drink alcohol or use illicit drugs.  Family History  Problem Relation Age of Onset  . Pancreatic cancer Sister   . Heart disease Father   . Hypertension Brother     Medications: Patient's Medications  New Prescriptions   No medications on file  Previous Medications   ALBUTEROL (PROVENTIL HFA;VENTOLIN HFA) 108 (90 BASE) MCG/ACT INHALER    Inhale 2 puffs into the lungs every 6 (six) hours as needed for wheezing or shortness of breath.   AMLODIPINE (NORVASC) 10 MG TABLET    Take 1 tablet (10 mg total) by  mouth daily.   ATORVASTATIN (LIPITOR) 10 MG TABLET    Take 10 mg by mouth at bedtime.   BLOOD GLUCOSE MONITORING SUPPL (ONE TOUCH ULTRA SYSTEM KIT) W/DEVICE KIT    1 kit by Does not apply route once. Use as Directed. DX: 250.02   BUDESONIDE-FORMOTEROL (SYMBICORT) 80-4.5 MCG/ACT INHALER    Inhale 2 puffs into the lungs 2 (two) times daily.   GLUCOSE BLOOD TEST STRIP    One Touch Ultra Test Strips Test Blood Sugar up to three times daily Dx 250.00    401.9   HYDRALAZINE (APRESOLINE) 25 MG TABLET    Take 1 tablet (25 mg total) by mouth 2 (two) times daily.   LINAGLIPTIN (TRADJENTA) 5 MG TABS TABLET    Take 5 mg by mouth daily.   METOPROLOL  (LOPRESSOR) 50 MG TABLET    Take 1 tablet (50 mg total) by mouth 2 (two) times daily.   MONTELUKAST (SINGULAIR) 10 MG TABLET    Take 10 mg by mouth at bedtime.   PAROXETINE (PAXIL) 20 MG TABLET    Take one tablet by mouth once daily for depression   TRAZODONE (DESYREL) 50 MG TABLET    Take one tablet by mouth at bedtime as needed for insomnia  Modified Medications   No medications on file  Discontinued Medications   GLIPIZIDE (GLUCOTROL) 5 MG TABLET    Take one tablet once daily to control blood sugar     Physical Exam: Filed Vitals:   02/03/14 1307  BP: 118/70  Pulse: 55  Temp: 97.7 F (36.5 C)  TempSrc: Oral  Weight: 208 lb 6.4 oz (94.53 kg)  SpO2: 94%  Physical Exam  Constitutional: He is oriented to person, place, and time. He appears well-developed and well-nourished. No distress.  Obese white male  Cardiovascular: Normal rate, regular rhythm, normal heart sounds and intact distal pulses.   Pulmonary/Chest: Effort normal and breath sounds normal.  Abdominal: Soft. Bowel sounds are normal. He exhibits no distension and no mass. There is no tenderness.  Musculoskeletal: Normal range of motion. He exhibits no edema and no tenderness.  Neurological: He is alert and oriented to person, place, and time.  Psychiatric: He has a normal mood and affect.    [name] reviewed: Basic Metabolic Panel:  Recent [name]  06/10/13 1233 09/17/13 0907 01/18/14 0806  NA 141 142 142  K 4.4 4.4 4.5  CL 99 100 105  CO2 27 25 26   GLUCOSE 70 189* 154*  BUN 18 29* 24  CREATININE 1.37* 1.62* 1.45*  CALCIUM 10.2 10.1 10.0   Liver Function Tests:  Recent [name]  09/17/13 0907  AST 16  ALT 17  ALKPHOS 61  BILITOT 0.4  PROT 6.6   No results found for this basename: LIPASE, AMYLASE,  in the last 8760 hours No results found for this basename: AMMONIA,  in the last 8760 hours CBC:  Recent [name]  03/11/13 1045 09/17/13 0907  WBC 7.1 6.9  NEUTROABS 3.5 3.7  HGB 13.3 14.6  HCT 38.8 42.0    MCV 92 92   Lipid Panel:  Recent [name]  03/11/13 1045 01/18/14 0806  HDL 40 37*  LDLCALC 202* 125*  TRIG 125 116  CHOLHDL 6.7* 5.0   Lab Results  Component Value Date   HGBA1C 6.5* 01/18/2014   Assessment/Plan 1. Type 2 diabetes mellitus with diabetic chronic kidney disease - has been well controlled w/o hypoglycemia recently -given 2 weeks of samples and a prescription for his tradjenta -linagliptin (  TRADJENTA) 5 MG TABS tablet; Take 1 tablet (5 mg total) by mouth daily.  Dispense: 30 tablet; Refill: 3 - Lipid panel; Future - Hemoglobin A1c; Future - Basic metabolic panel; Future  2. COPD with asthma -well controlled, requires albuterol only 1x per week  3. Essential hypertension -bp at goal on current regimen  4. Obesity (BMI 30-39.9) -weight has been trending gradually down with regular exercise (walking and weight machines) and diet  5. Hyperlipidemia associated with type 2 diabetes mellitus -lipids have improved, but LDL still not below 100--encouraged continued exercise, statin;  If not at goal next time, may consider adding another agent to get to goal - Lipid panel; Future  6. Depression -well controlled with paroxetine and trazodone--will be careful adjusting these due to his genetic testing results  7. Alzheimer's disease -cont regular exercise and mental stimulation and secondary prevention of vascular events (probably has mixed dementia)  [name]/tests ordered:    Orders Placed This Encounter  Procedures  . Lipid panel    Standing Status: Future     Number of Occurrences:      Standing Expiration Date: 10/05/2014    Order Specific Question:  Has the patient fasted?    Answer:  Yes  . Hemoglobin A1c    Standing Status: Future     Number of Occurrences:      Standing Expiration Date: 10/05/2014  . Basic metabolic panel    Standing Status: Future     Number of Occurrences:      Standing Expiration Date: 10/05/2014    Order Specific Question:  Has the  patient fasted?    Answer:  Yes    Next appt:  4 mos with [name] before

## 2014-03-28 ENCOUNTER — Encounter: Payer: Self-pay | Admitting: Internal Medicine

## 2014-03-28 ENCOUNTER — Ambulatory Visit (INDEPENDENT_AMBULATORY_CARE_PROVIDER_SITE_OTHER): Payer: Commercial Managed Care - HMO | Admitting: Internal Medicine

## 2014-03-28 VITALS — BP 118/76 | HR 54 | Temp 97.7°F | Resp 18 | Ht 67.0 in | Wt 203.0 lb

## 2014-03-28 DIAGNOSIS — Z23 Encounter for immunization: Secondary | ICD-10-CM

## 2014-03-28 DIAGNOSIS — J449 Chronic obstructive pulmonary disease, unspecified: Secondary | ICD-10-CM

## 2014-03-28 DIAGNOSIS — J4489 Other specified chronic obstructive pulmonary disease: Secondary | ICD-10-CM

## 2014-03-28 DIAGNOSIS — E1129 Type 2 diabetes mellitus with other diabetic kidney complication: Secondary | ICD-10-CM

## 2014-03-28 DIAGNOSIS — I10 Essential (primary) hypertension: Secondary | ICD-10-CM

## 2014-03-28 DIAGNOSIS — N189 Chronic kidney disease, unspecified: Secondary | ICD-10-CM

## 2014-03-28 DIAGNOSIS — E1122 Type 2 diabetes mellitus with diabetic chronic kidney disease: Secondary | ICD-10-CM

## 2014-03-28 NOTE — Progress Notes (Signed)
Patient ID: Ian [name], male   DOB: May 08, 1936, 78 y.o.   MRN: [id number]   Location:  [facility] [place] [facility]  Code Status: DNR   No Known Allergies  Chief Complaint  Patient presents with  . Hyperglycemia    Elevated Bloodsugar    HPI: Patient is a 78 y.o.  seen in the office today for acute visit due to above.  Fasting sugars around 140.  Some days has sugar 200 postprandially when the day before will be lower.  Has lost some weight.  Says he slacked a little on walking, but not too bad.    Review of Systems:  Review of Systems  Constitutional: Positive for weight loss. Negative for fever, chills and malaise/fatigue.  HENT: Negative for congestion.   Eyes: Negative for blurred vision.  Respiratory: Negative for shortness of breath.   Cardiovascular: Negative for chest pain.  Gastrointestinal: Negative for abdominal pain, blood in stool and melena.  Musculoskeletal: Negative for falls.  Neurological: Negative for dizziness, weakness and headaches.  Endo/Heme/Allergies:       Diabetes  Psychiatric/Behavioral: Positive for memory loss.     Past Medical History  Diagnosis Date  . Diabetes mellitus   . Hypertension   . COPD (chronic obstructive pulmonary disease)   . Kidney disease     mild  . Hyperlipidemia   . Hypertrophy of prostate without urinary obstruction and other lower urinary tract symptoms (LUTS)   . Hypercalcemia   . Edema   . Ventral hernia, unspecified, without mention of obstruction or gangrene   . Anxiety   . Depression   . Allergy   . Unspecified sleep apnea   . Hypertrophy of prostate without urinary obstruction and other lower urinary tract symptoms (LUTS)   . Anemia, unspecified   . Unspecified disorder of kidney and ureter   . Alzheimer's disease   . Chronic maxillary sinusitis     Past Surgical History  Procedure Laterality Date  . Hernia repair    . Eyplosatory lap  2002    Social History:   reports  that he quit smoking about 25 years ago. He has never used smokeless tobacco. He reports that he does not drink alcohol or use illicit drugs.  Family History  Problem Relation Age of Onset  . Pancreatic cancer Sister   . Heart disease Father   . Hypertension Brother   . Hypertension Sister     Medications: Patient's Medications  New Prescriptions   No medications on file  Previous Medications   ALBUTEROL (PROVENTIL HFA;VENTOLIN HFA) 108 (90 BASE) MCG/ACT INHALER    Inhale 2 puffs into the lungs every 6 (six) hours as needed for wheezing or shortness of breath.   AMLODIPINE (NORVASC) 10 MG TABLET    Take 1 tablet (10 mg total) by mouth daily.   ATORVASTATIN (LIPITOR) 10 MG TABLET    Take 10 mg by mouth at bedtime.   BLOOD GLUCOSE MONITORING SUPPL (ONE TOUCH ULTRA SYSTEM KIT) W/DEVICE KIT    1 kit by Does not apply route once. Use as Directed. DX: 250.02   BUDESONIDE-FORMOTEROL (SYMBICORT) 80-4.5 MCG/ACT INHALER    Inhale 2 puffs into the lungs 2 (two) times daily.   GLUCOSE BLOOD TEST STRIP    One Touch Ultra Test Strips Test Blood Sugar up to three times daily Dx 250.00    401.9   HYDRALAZINE (APRESOLINE) 25 MG TABLET    Take 1 tablet (25 mg total) by mouth 2 (  two) times daily.   LINAGLIPTIN (TRADJENTA) 5 MG TABS TABLET    Take 1 tablet (5 mg total) by mouth daily.   METOPROLOL (LOPRESSOR) 50 MG TABLET    Take 1 tablet (50 mg total) by mouth 2 (two) times daily.   MONTELUKAST (SINGULAIR) 10 MG TABLET    Take 10 mg by mouth at bedtime.   PAROXETINE (PAXIL) 20 MG TABLET    Take one tablet by mouth once daily for depression   TRAZODONE (DESYREL) 50 MG TABLET    Take one tablet by mouth at bedtime as needed for insomnia  Modified Medications   No medications on file  Discontinued Medications   No medications on file     Physical Exam: Filed Vitals:   03/28/14 1127  BP: 118/76  Pulse: 54  Temp: 97.7 F (36.5 C)  TempSrc: Oral  SpO2: 96%  Physical Exam  Constitutional: He is  oriented to person, place, and time. He appears well-developed and well-nourished. He appears distressed.  Cardiovascular: Normal rate, regular rhythm, normal heart sounds and intact distal pulses.   Pulmonary/Chest: Effort normal and breath sounds normal. He has no wheezes.  Abdominal: Soft. Bowel sounds are normal. He exhibits no distension and no mass. There is no tenderness.  Musculoskeletal: Normal range of motion.  Neurological: He is alert and oriented to person, place, and time.  Skin: Skin is warm and dry.    [name] reviewed: Basic Metabolic Panel:  Recent [name]  06/10/13 1233 09/17/13 0907 01/18/14 0806  NA 141 142 142  K 4.4 4.4 4.5  CL 99 100 105  CO2 27 25 26   GLUCOSE 70 189* 154*  BUN 18 29* 24  CREATININE 1.37* 1.62* 1.45*  CALCIUM 10.2 10.1 10.0   Liver Function Tests:  Recent [name]  09/17/13 0907  AST 16  ALT 17  ALKPHOS 61  BILITOT 0.4  PROT 6.6   No results found for this basename: LIPASE, AMYLASE,  in the last 8760 hours No results found for this basename: AMMONIA,  in the last 8760 hours CBC:  Recent [name]  09/17/13 0907  WBC 6.9  NEUTROABS 3.7  HGB 14.6  HCT 42.0  MCV 92   Lipid Panel:  Recent [name]  01/18/14 0806  HDL 37*  LDLCALC 125*  TRIG 116  CHOLHDL 5.0   Lab Results  Component Value Date   HGBA1C 6.5* 01/18/2014   Assessment/Plan 1. Type 2 diabetes mellitus with diabetic chronic kidney disease -sugars are well controlled-has few rare outliers over 200 without a pattern -has lost 5 lbs--suspect this is due to his tradjenta and walking on the treadmill and unless he develops other symptoms or problems is favorable for him  2. COPD with asthma -no s/s of exacerbation which is often brought on by allergies--they have gotten someone else to do the yardwork so he does not develop allergies from that  3. Essential hypertension -at goal, no s/s of orthostatic hypotension recently  4. Need for prophylactic vaccination and  inoculation against influenza -given today  [name]/tests ordered:  Keep appt Next appt:  Keep appt

## 2014-05-16 IMAGING — US US RENAL
1 series · 14 of 25 positions shown · non-contrast
Comparison: 10/13/2009.

CLINICAL DATA: 76-year-old male with acute renal failure.

RENAL/URINARY TRACT ULTRASOUND COMPLETE

[Series 1: us renal · 0.33mm/px · 14 of 35 slices shown]
[im 1/35]
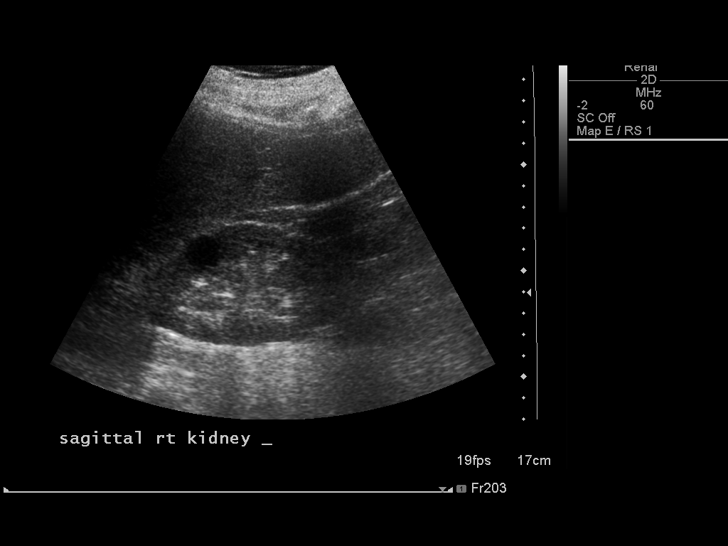
[im 3/35]
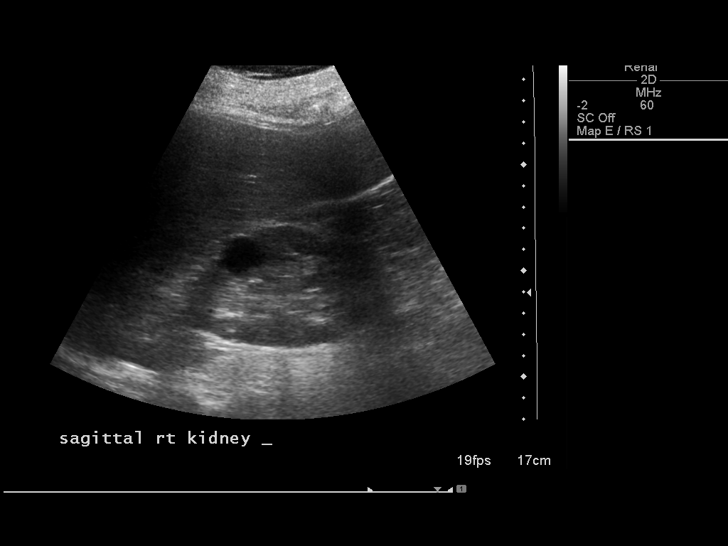
[im 6/35]
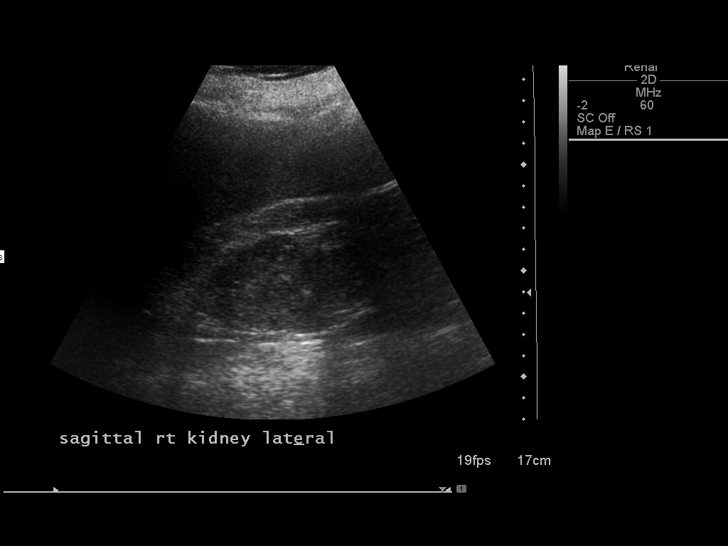
[im 9/35]
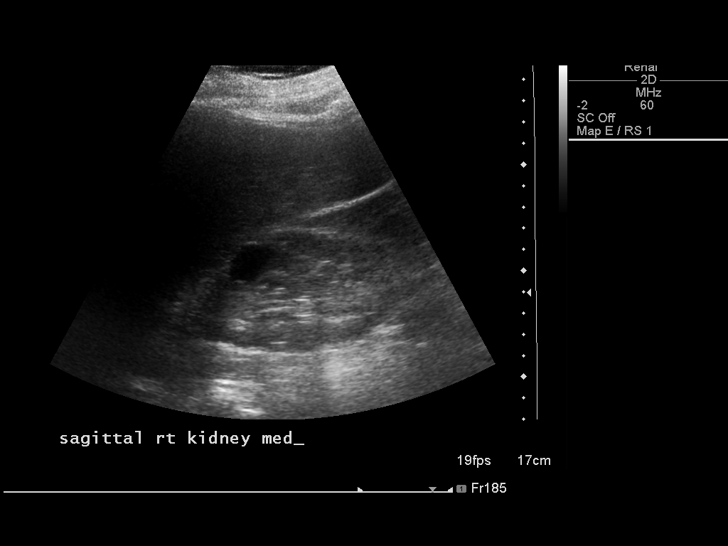
[im 12/35]
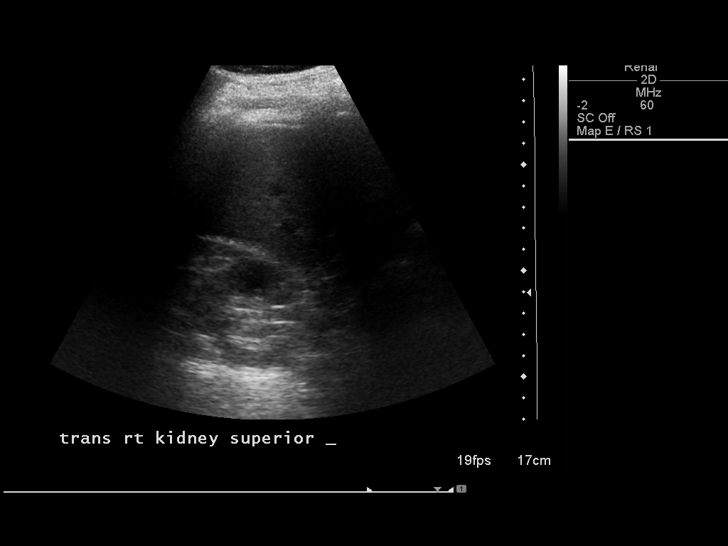
[im 13/35]
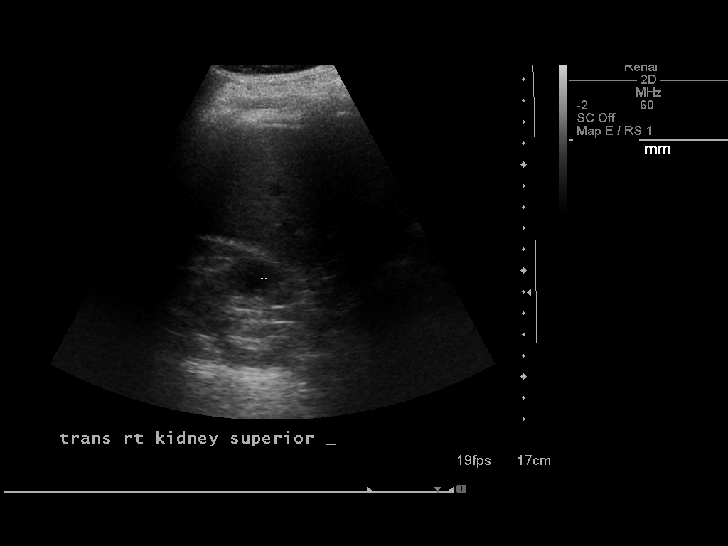
[im 16/35]
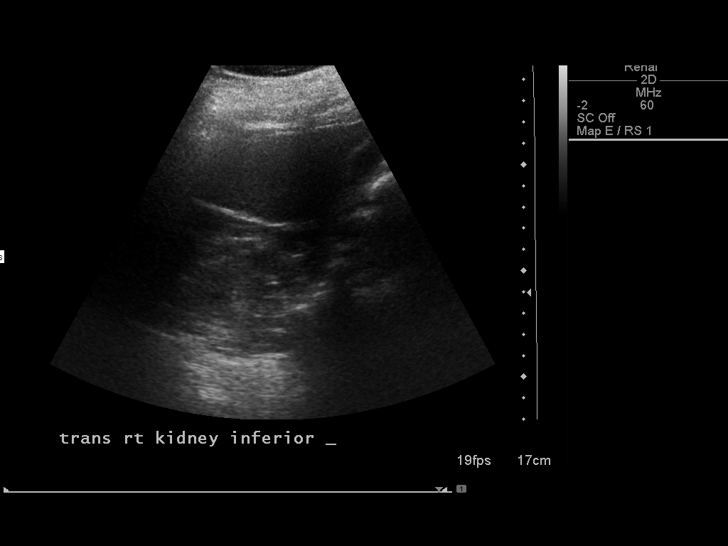
[im 19/35]
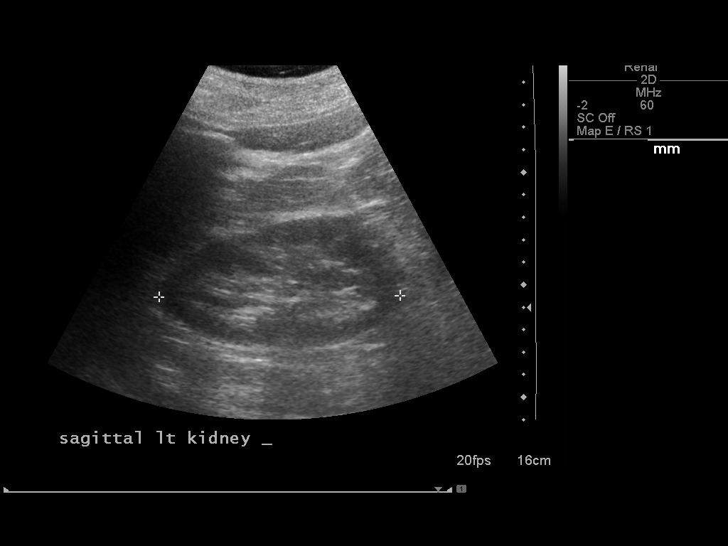
[im 22/35]
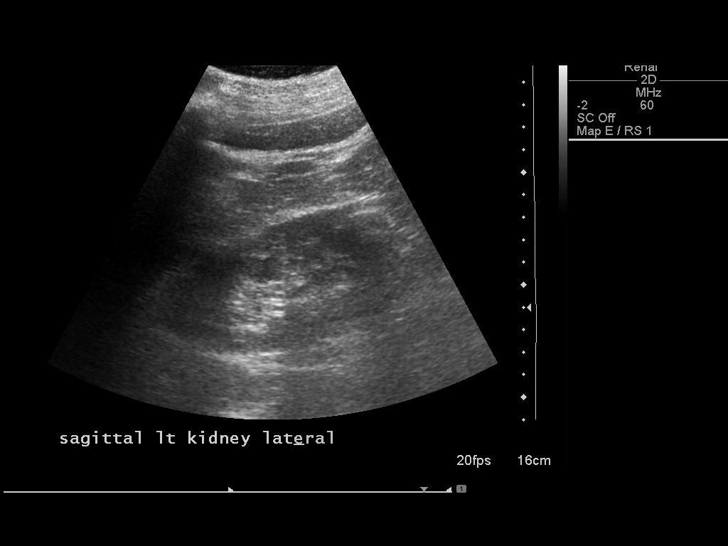
[im 23/35]
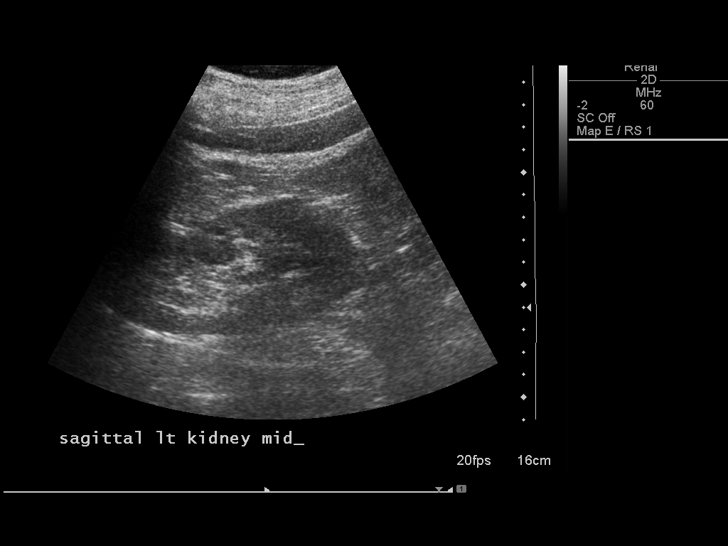
[im 26/35]
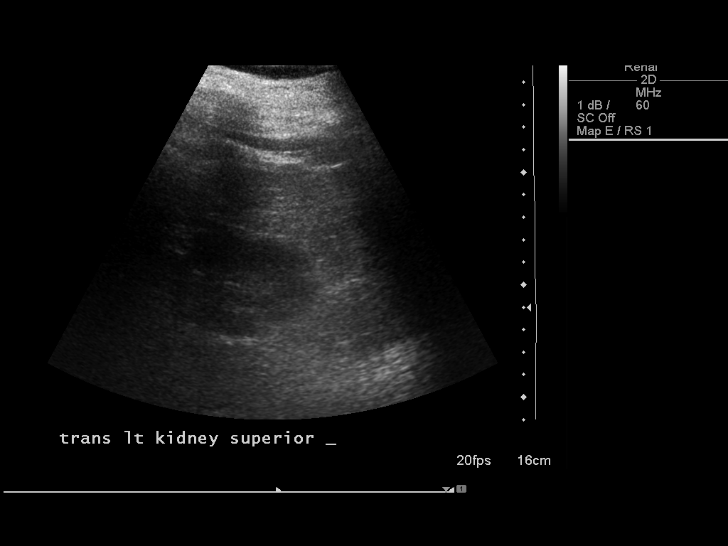
[im 29/35]
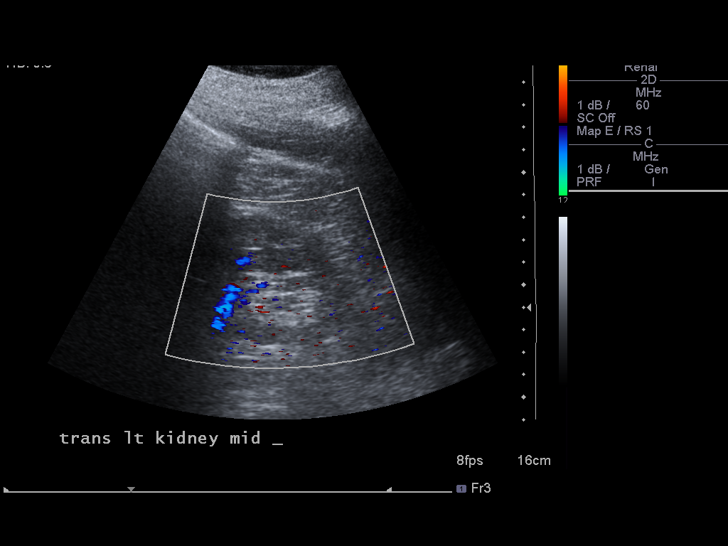
[im 32/35]
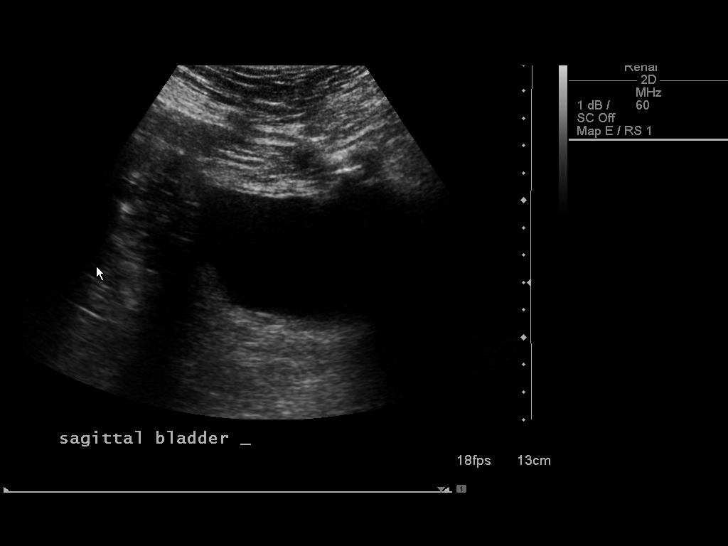
[im 35/35]
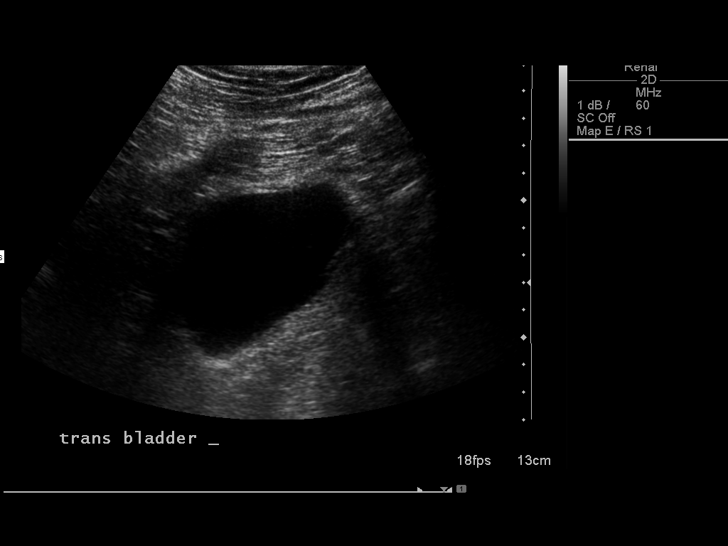

[14 of 25 positions shown; findings below may reference images not displayed]

FINDINGS: Right Kidney:  No hydronephrosis.  Renal length 11.4 cm.  Cortical
echotexture stable within normal limits.  Unchanged small upper
pole simple appearing cyst.

Left Kidney:  No hydronephrosis.  Renal length 10.7 cm.  Cortical
echotexture within normal limits.

Bladder:  Unremarkable.
IMPRESSION: Stable and negative sonographic appearance of the kidneys and
bladder.

## 2014-05-17 IMAGING — CR DG CHEST 2V
2 series · 2 of 2 positions shown · non-contrast
Comparison: Chest x-rays dated 12/28/2012 and a chest CT dated
12/29/2012

CLINICAL DATA: Pneumonia.  Shortness of breath.

CHEST - 2 VIEW

[w chest lat]
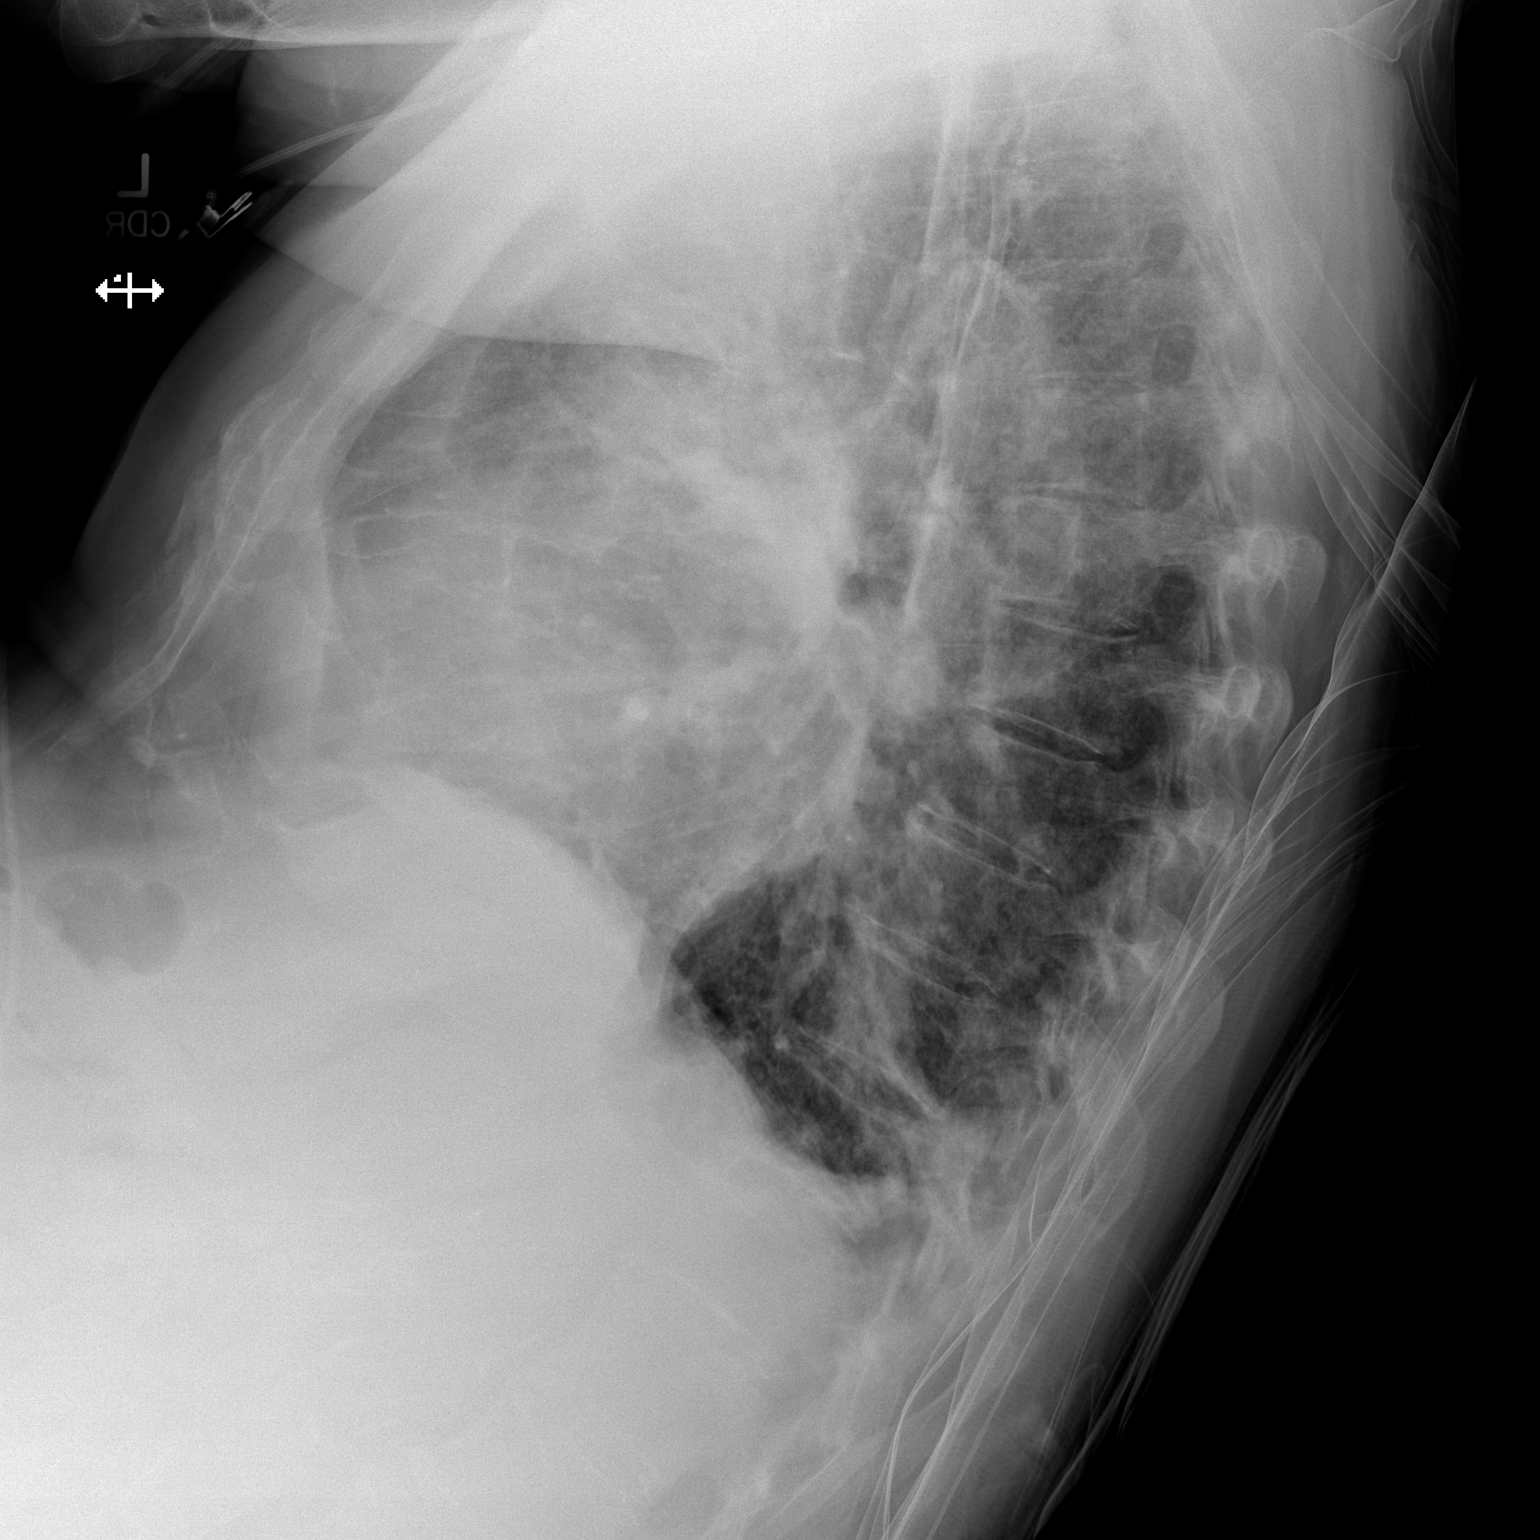

[x chest ap]
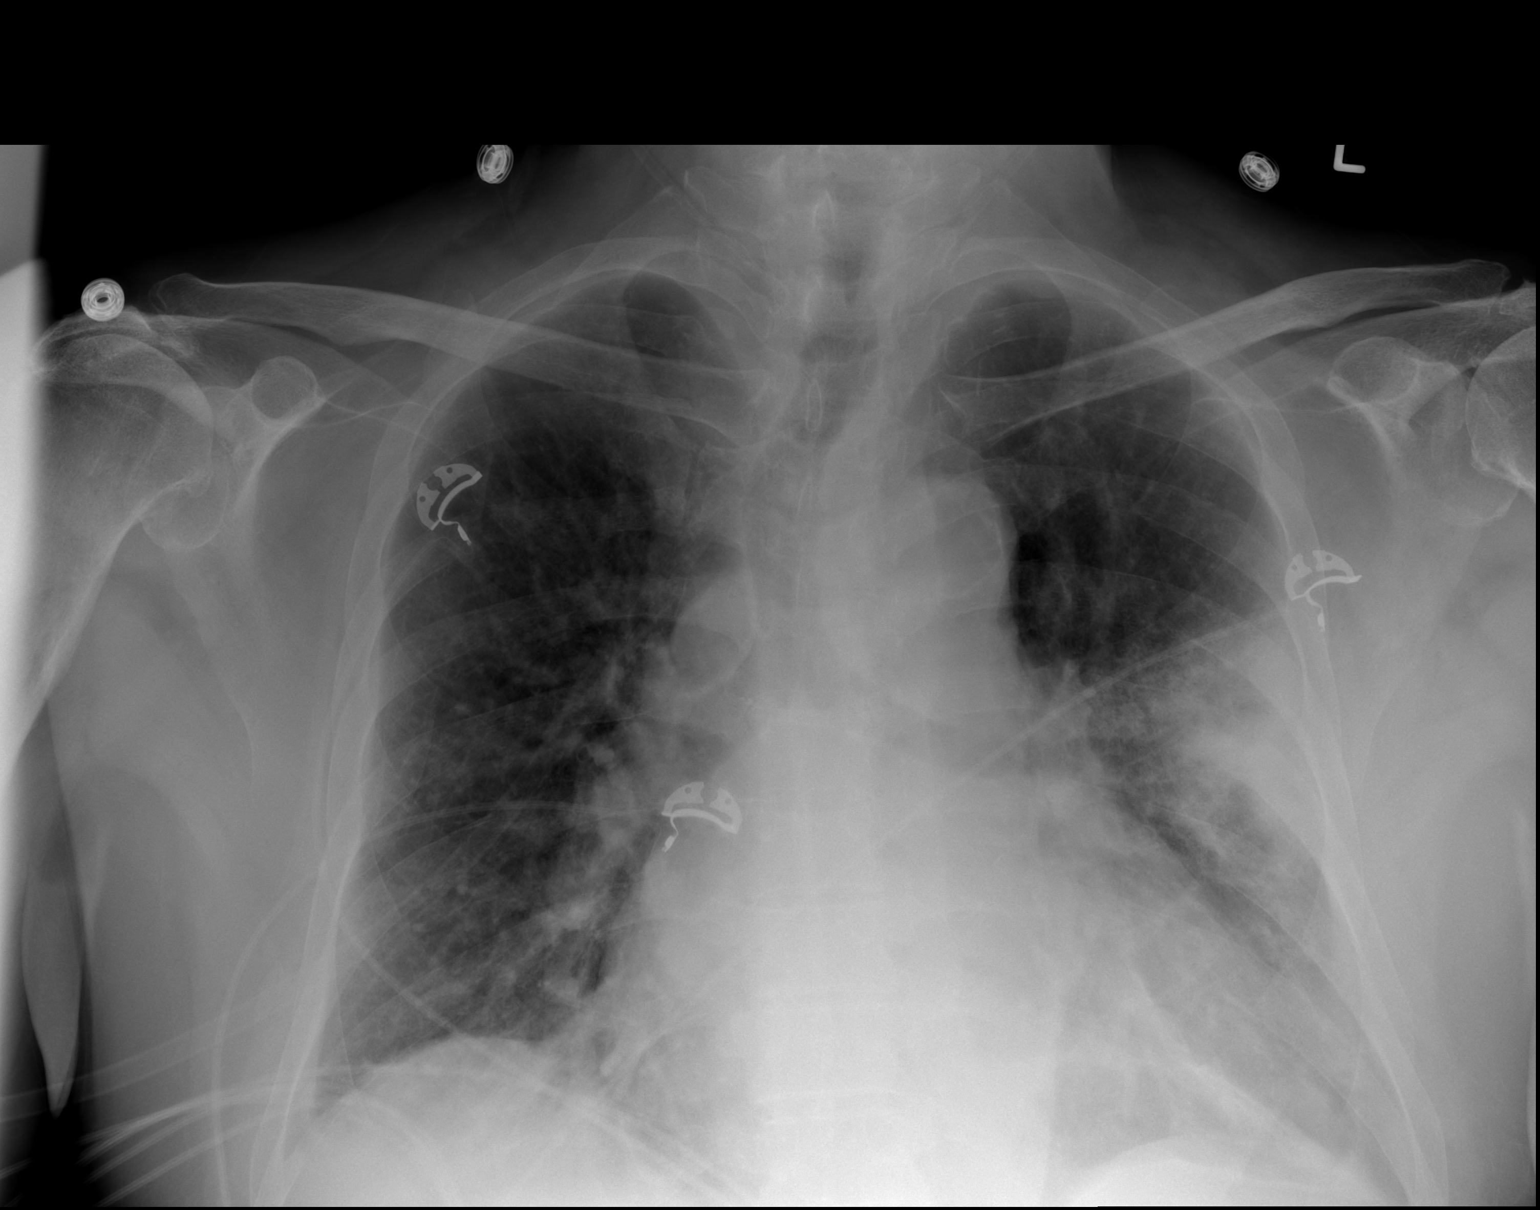

[2 of 2 positions shown; findings below may reference images not displayed]

FINDINGS: There has been appreciable progression of the
consolidation in the left upper lobe peripherally with new
atelectasis at the left lung base posteriorly.

Chronic cardiomegaly.  Slight pulmonary vascular prominence.  Right
lung is clear.  No acute osseous abnormality.
IMPRESSION: Progression of left upper lobe pneumonia.  New atelectasis at the
left lung base.

## 2014-05-18 ENCOUNTER — Other Ambulatory Visit: Payer: Self-pay | Admitting: *Deleted

## 2014-05-18 DIAGNOSIS — E1122 Type 2 diabetes mellitus with diabetic chronic kidney disease: Secondary | ICD-10-CM

## 2014-05-18 MED ORDER — LINAGLIPTIN 5 MG PO TABS: ORAL_TABLET | ORAL | Status: DC

## 2014-05-18 NOTE — Telephone Encounter (Signed)
Patient walked in office and requested to be faxed to [facility]. Faxed.

## 2014-05-31 ENCOUNTER — Other Ambulatory Visit: Payer: Commercial Managed Care - HMO

## 2014-05-31 DIAGNOSIS — E785 Hyperlipidemia, unspecified: Secondary | ICD-10-CM

## 2014-05-31 DIAGNOSIS — E1122 Type 2 diabetes mellitus with diabetic chronic kidney disease: Secondary | ICD-10-CM

## 2014-05-31 DIAGNOSIS — E1169 Type 2 diabetes mellitus with other specified complication: Secondary | ICD-10-CM

## 2014-06-01 LAB — BASIC METABOLIC PANEL
BUN/Creatinine Ratio: 17 (ref 10–22)
BUN: 25 mg/dL (ref 8–27)
CO2: 27 mmol/L (ref 18–29)
Calcium: 9.8 mg/dL (ref 8.6–10.2)
Chloride: 106 mmol/L (ref 97–108)
Creatinine, Ser: 1.45 mg/dL — ABNORMAL HIGH (ref 0.76–1.27)
GFR calc Af Amer: 54 mL/min/{1.73_m2} — ABNORMAL LOW (ref >59)
GFR calc non Af Amer: 46 mL/min/{1.73_m2} — ABNORMAL LOW (ref >59)
Glucose: 151 mg/dL — ABNORMAL HIGH (ref 65–99)
Potassium: 4.5 mmol/L (ref 3.5–5.2)
Sodium: 146 mmol/L — ABNORMAL HIGH (ref 134–144)

## 2014-06-01 LAB — LIPID PANEL
Chol/HDL Ratio: 4.2 ratio units (ref 0.0–5.0)
Cholesterol, Total: 169 mg/dL (ref 100–199)
HDL: 40 mg/dL (ref >39)
LDL Calculated: 105 mg/dL — ABNORMAL HIGH (ref 0–99)
Triglycerides: 121 mg/dL (ref 0–149)
VLDL Cholesterol Cal: 24 mg/dL (ref 5–40)

## 2014-06-01 LAB — HEMOGLOBIN A1C
Est. average glucose Bld gHb Est-mCnc: 148 mg/dL
Hgb A1c MFr Bld: 6.8 % — ABNORMAL HIGH (ref 4.8–5.6)

## 2014-06-06 ENCOUNTER — Encounter: Payer: Self-pay | Admitting: Internal Medicine

## 2014-06-06 ENCOUNTER — Ambulatory Visit (INDEPENDENT_AMBULATORY_CARE_PROVIDER_SITE_OTHER): Payer: Commercial Managed Care - HMO | Admitting: Internal Medicine

## 2014-06-06 VITALS — BP 100/60 | HR 54 | Temp 97.5°F | Ht 67.0 in | Wt 203.4 lb

## 2014-06-06 DIAGNOSIS — E669 Obesity, unspecified: Secondary | ICD-10-CM

## 2014-06-06 DIAGNOSIS — N189 Chronic kidney disease, unspecified: Secondary | ICD-10-CM

## 2014-06-06 DIAGNOSIS — J4489 Other specified chronic obstructive pulmonary disease: Secondary | ICD-10-CM

## 2014-06-06 DIAGNOSIS — J449 Chronic obstructive pulmonary disease, unspecified: Secondary | ICD-10-CM

## 2014-06-06 DIAGNOSIS — Z23 Encounter for immunization: Secondary | ICD-10-CM

## 2014-06-06 DIAGNOSIS — E1169 Type 2 diabetes mellitus with other specified complication: Secondary | ICD-10-CM

## 2014-06-06 DIAGNOSIS — E785 Hyperlipidemia, unspecified: Secondary | ICD-10-CM

## 2014-06-06 DIAGNOSIS — I10 Essential (primary) hypertension: Secondary | ICD-10-CM

## 2014-06-06 DIAGNOSIS — E1122 Type 2 diabetes mellitus with diabetic chronic kidney disease: Secondary | ICD-10-CM

## 2014-06-06 MED ORDER — LINAGLIPTIN 5 MG PO TABS: ORAL_TABLET | ORAL | Status: DC

## 2014-06-06 NOTE — Progress Notes (Signed)
Patient ID: Ian [name], male   DOB: 1936-06-08, 78 y.o.   MRN: [id number]   Location:  [facility] [place] [facility]  Code Status: DNR    No Known Allergies  Chief Complaint  Patient presents with  . Follow-up    4 month follow up (Discuss [name]) copy printed    HPI: Patient is a 78 y.o.  White male seen in the office today for med mgt chronic diseases.  His wife is always worried his sugars are too high, but his hba1c remains in the 6 range.  He is doing very well except his hearing aides are broken.   Review of Systems:  Review of Systems  Constitutional: Negative for fever, chills and weight loss.  HENT: Positive for hearing loss. Negative for congestion.   Eyes: Negative for blurred vision.  Respiratory: Negative for shortness of breath.   Cardiovascular: Negative for chest pain and leg swelling.  Gastrointestinal: Negative for abdominal pain.  Genitourinary: Negative for dysuria, urgency and frequency.  Musculoskeletal: Negative for myalgias, back pain, joint pain and falls.  Skin: Negative for rash.  Neurological: Negative for dizziness, loss of consciousness and weakness.  Endo/Heme/Allergies: Does not bruise/bleed easily.  Psychiatric/Behavioral: Positive for memory loss. Negative for depression.     Past Medical History  Diagnosis Date  . Diabetes mellitus   . Hypertension   . COPD (chronic obstructive pulmonary disease)   . Kidney disease     mild  . Hyperlipidemia   . Hypertrophy of prostate without urinary obstruction and other lower urinary tract symptoms (LUTS)   . Hypercalcemia   . Edema   . Ventral hernia, unspecified, without mention of obstruction or gangrene   . Anxiety   . Depression   . Allergy   . Unspecified sleep apnea   . Hypertrophy of prostate without urinary obstruction and other lower urinary tract symptoms (LUTS)   . Anemia, unspecified   . Unspecified disorder of kidney and ureter   . Alzheimer's disease   .  Chronic maxillary sinusitis     Past Surgical History  Procedure Laterality Date  . Hernia repair    . Eyplosatory lap  2002    Social History:   reports that he quit smoking about 25 years ago. He has never used smokeless tobacco. He reports that he does not drink alcohol or use illicit drugs.  Family History  Problem Relation Age of Onset  . Pancreatic cancer Sister   . Heart disease Father   . Hypertension Brother   . Hypertension Sister     Medications: Patient's Medications  New Prescriptions   No medications on file  Previous Medications   ALBUTEROL (PROVENTIL HFA;VENTOLIN HFA) 108 (90 BASE) MCG/ACT INHALER    Inhale 2 puffs into the lungs every 6 (six) hours as needed for wheezing or shortness of breath.   AMLODIPINE (NORVASC) 10 MG TABLET    Take 1 tablet (10 mg total) by mouth daily.   ATORVASTATIN (LIPITOR) 10 MG TABLET    Take 10 mg by mouth at bedtime.   BLOOD GLUCOSE MONITORING SUPPL (ONE TOUCH ULTRA SYSTEM KIT) W/DEVICE KIT    1 kit by Does not apply route once. Use as Directed. DX: 250.02   BUDESONIDE-FORMOTEROL (SYMBICORT) 80-4.5 MCG/ACT INHALER    Inhale 2 puffs into the lungs 2 (two) times daily.   GLUCOSE BLOOD TEST STRIP    One Touch Ultra Test Strips Test Blood Sugar up to three times daily Dx 250.00  401.9   HYDRALAZINE (APRESOLINE) 25 MG TABLET    Take 1 tablet (25 mg total) by mouth 2 (two) times daily.   METOPROLOL (LOPRESSOR) 50 MG TABLET    Take 1 tablet (50 mg total) by mouth 2 (two) times daily.   MONTELUKAST (SINGULAIR) 10 MG TABLET    Take 10 mg by mouth at bedtime.   PAROXETINE (PAXIL) 20 MG TABLET    Take one tablet by mouth once daily for depression   TRAZODONE (DESYREL) 50 MG TABLET    Take one tablet by mouth at bedtime as needed for insomnia  Modified Medications   Modified Medication Previous Medication   LINAGLIPTIN (TRADJENTA) 5 MG TABS TABLET linagliptin (TRADJENTA) 5 MG TABS tablet      Take one tablet by mouth once daily to control  blood sugar    Take one tablet by mouth once daily to control blood sugar  Discontinued Medications   No medications on file     Physical Exam: Filed Vitals:   06/06/14 1428  BP: 100/60  Pulse: 54  Temp: 97.5 F (36.4 C)  TempSrc: Oral  Height: _0  (1.702 m)  Weight: 203 lb 6.4 oz (92.262 kg)  SpO2: 96%  Physical Exam  Constitutional: He is oriented to person, place, and time. He appears well-developed and well-nourished. No distress.  Cardiovascular: Normal rate, regular rhythm, normal heart sounds and intact distal pulses.   Pulmonary/Chest: Effort normal and breath sounds normal. No respiratory distress.  Abdominal: Soft. Bowel sounds are normal. He exhibits no distension and no mass. There is no tenderness.  Neurological: He is alert and oriented to person, place, and time.  Skin: Skin is warm and dry.    [name] reviewed: Basic Metabolic Panel:  Recent [name]  09/17/13 0907 01/18/14 0806 05/31/14 0815  NA 142 142 146*  K 4.4 4.5 4.5  CL 100 105 106  CO2 _1 GLUCOSE 189* 154* 151*  BUN 29* 24 25  CREATININE 1.62* 1.45* 1.45*  CALCIUM 10.1 10.0 9.8   Liver Function Tests:  Recent [name]  09/17/13 0907  AST 16  ALT 17  ALKPHOS 61  BILITOT 0.4  PROT 6.6   No results for input(s): LIPASE, AMYLASE in the last 8760 hours. No results for input(s): AMMONIA in the last 8760 hours. CBC:  Recent [name]  09/17/13 0907  WBC 6.9  NEUTROABS 3.7  HGB 14.6  HCT 42.0  MCV 92   Lipid Panel:  Recent [name]  01/18/14 0806 05/31/14 0815  HDL 37* 40  LDLCALC 125* 105*  TRIG 116 121  CHOLHDL 5.0 4.2   Lab Results  Component Value Date   HGBA1C 6.8* 05/31/2014     Assessment/Plan 1. Type 2 diabetes mellitus with diabetic chronic kidney disease - linagliptin (TRADJENTA) 5 MG TABS tablet; Take one tablet by mouth once daily to control blood sugar  Dispense: 30 tablet; Refill: 5 - Pneumococcal conjugate vaccine 13-valent - Comprehensive metabolic panel;  Future - CBC With differential/Platelet; Future - Hemoglobin A1c; Future - Microalbumin/Creatinine Ratio, Urine; Future  2. Need for vaccination with 13-polyvalent pneumococcal conjugate vaccine - Pneumococcal conjugate vaccine 13-valent given today  3.  Essential hypertension:  bp at goal with current medications  4.  Obesity:  Continue regular walking at gym  5.  Hyperlipidemia:  Cont lipitor  6.  COPD with asthma:  Stable, uses albuterol about 3x per month   [name]/tests ordered: Orders Placed This Encounter  Procedures  . Pneumococcal conjugate vaccine  13-valent  . Comprehensive metabolic panel    Standing Status: Future     Number of Occurrences:      Standing Expiration Date: 02/04/2015  . CBC With differential/Platelet    Standing Status: Future     Number of Occurrences:      Standing Expiration Date: 02/04/2015  . Hemoglobin A1c    Standing Status: Future     Number of Occurrences:      Standing Expiration Date: 02/04/2015  . Microalbumin/Creatinine Ratio, Urine    Standing Status: Future     Number of Occurrences:      Standing Expiration Date: 02/04/2015    Next appt:4 mos [name] before  Ian [name], D.O. [facility] [address], [place] [address] Cell Phone (Mon-Fri 8am-5pm):  [phone] On Call:  [phone] & follow prompts after 5pm & weekends Office Phone:  [phone] Office Fax:  [phone]

## 2014-06-16 ENCOUNTER — Other Ambulatory Visit: Payer: Self-pay | Admitting: Internal Medicine

## 2014-07-04 ENCOUNTER — Telehealth: Payer: Self-pay | Admitting: *Deleted

## 2014-07-04 NOTE — Telephone Encounter (Signed)
Patient 's wife called requesting a change in the diabetic medication due to the increased cost. I informed her that I needed to speak with Dr. Mariea Clonts regarding this and I would get back with her in approx. 30 mins.  After speaking with Dr. Mariea Clonts, she stated that the patient need to stay on this medication because it was better for him due to his renal problems. He was given some sample for the next two months.

## 2014-07-27 ENCOUNTER — Other Ambulatory Visit: Payer: Self-pay | Admitting: *Deleted

## 2014-07-27 MED ORDER — GLUCOSE BLOOD VI STRP: ORAL_STRIP | Status: DC

## 2014-07-27 MED ORDER — ACCU-CHEK AVIVA DEVI: Status: DC

## 2014-07-27 NOTE — Telephone Encounter (Signed)
[facility] fax Order

## 2014-08-01 ENCOUNTER — Other Ambulatory Visit: Payer: Self-pay | Admitting: *Deleted

## 2014-08-01 MED ORDER — ACCU-CHEK AVIVA DEVI: Status: DC

## 2014-08-01 MED ORDER — GLUCOSE BLOOD VI STRP: ORAL_STRIP | Status: DC

## 2014-08-01 NOTE — Telephone Encounter (Signed)
[facility] 

## 2014-08-10 ENCOUNTER — Other Ambulatory Visit: Payer: Self-pay | Admitting: *Deleted

## 2014-08-10 MED ORDER — GLUCOSE BLOOD VI STRP: ORAL_STRIP | Status: DC

## 2014-08-10 MED ORDER — ACCU-CHEK AVIVA DEVI: Status: DC

## 2014-08-10 NOTE — Telephone Encounter (Signed)
[facility] 

## 2014-08-26 DIAGNOSIS — IMO0002 Reserved for concepts with insufficient information to code with codable children: Secondary | ICD-10-CM | POA: Insufficient documentation

## 2014-08-26 LAB — HM DIABETES EYE EXAM

## 2014-09-19 ENCOUNTER — Other Ambulatory Visit: Payer: Self-pay | Admitting: *Deleted

## 2014-09-19 MED ORDER — METOPROLOL TARTRATE 50 MG PO TABS: 50.0000 mg | ORAL_TABLET | Freq: Two times a day (BID) | ORAL | Status: DC

## 2014-09-19 NOTE — Telephone Encounter (Signed)
[facility] 

## 2014-09-28 ENCOUNTER — Other Ambulatory Visit: Payer: Commercial Managed Care - HMO

## 2014-09-28 DIAGNOSIS — E1122 Type 2 diabetes mellitus with diabetic chronic kidney disease: Secondary | ICD-10-CM

## 2014-09-29 LAB — CBC WITH DIFFERENTIAL
Basophils Absolute: 0.1 10*3/uL (ref 0.0–0.2)
Basos: 1 %
Eos: 3 %
Eosinophils Absolute: 0.2 10*3/uL (ref 0.0–0.4)
HCT: 41.3 % (ref 37.5–51.0)
Hemoglobin: 13.8 g/dL (ref 12.6–17.7)
Immature Grans (Abs): 0 10*3/uL (ref 0.0–0.1)
Immature Granulocytes: 0 %
Lymphocytes Absolute: 1.8 10*3/uL (ref 0.7–3.1)
Lymphs: 31 %
MCH: 31.2 pg (ref 26.6–33.0)
MCHC: 33.4 g/dL (ref 31.5–35.7)
MCV: 93 fL (ref 79–97)
Monocytes Absolute: 0.6 10*3/uL (ref 0.1–0.9)
Monocytes: 11 %
Neutrophils Absolute: 3.2 10*3/uL (ref 1.4–7.0)
Neutrophils Relative %: 54 %
RBC: 4.42 x10E6/uL (ref 4.14–5.80)
RDW: 15.1 % (ref 12.3–15.4)
WBC: 5.8 10*3/uL (ref 3.4–10.8)

## 2014-09-29 LAB — COMPREHENSIVE METABOLIC PANEL
ALT: 12 IU/L (ref 0–44)
AST: 14 IU/L (ref 0–40)
Albumin/Globulin Ratio: 1.4 (ref 1.1–2.5)
Albumin: 3.8 g/dL (ref 3.5–4.8)
Alkaline Phosphatase: 63 IU/L (ref 39–117)
BUN/Creatinine Ratio: 18 (ref 10–22)
BUN: 25 mg/dL (ref 8–27)
Bilirubin Total: 0.4 mg/dL (ref 0.0–1.2)
CO2: 24 mmol/L (ref 18–29)
Calcium: 10 mg/dL (ref 8.6–10.2)
Chloride: 106 mmol/L (ref 97–108)
Creatinine, Ser: 1.42 mg/dL — ABNORMAL HIGH (ref 0.76–1.27)
GFR calc Af Amer: 55 mL/min/{1.73_m2} — ABNORMAL LOW (ref >59)
GFR calc non Af Amer: 48 mL/min/{1.73_m2} — ABNORMAL LOW (ref >59)
Globulin, Total: 2.7 g/dL (ref 1.5–4.5)
Glucose: 147 mg/dL — ABNORMAL HIGH (ref 65–99)
Potassium: 4.2 mmol/L (ref 3.5–5.2)
Sodium: 143 mmol/L (ref 134–144)
Total Protein: 6.5 g/dL (ref 6.0–8.5)

## 2014-09-29 LAB — MICROALBUMIN / CREATININE URINE RATIO
Creatinine, Urine: 91.4 mg/dL (ref 22.0–328.0)
MICROALB/CREAT RATIO: 2432.9 mg/g creat — ABNORMAL HIGH (ref 0.0–30.0)
Microalbumin, Urine: 2223.7 ug/mL — ABNORMAL HIGH (ref 0.0–17.0)

## 2014-09-29 LAB — HEMOGLOBIN A1C
Est. average glucose Bld gHb Est-mCnc: 143 mg/dL
Hgb A1c MFr Bld: 6.6 % — ABNORMAL HIGH (ref 4.8–5.6)

## 2014-10-03 ENCOUNTER — Other Ambulatory Visit: Payer: Commercial Managed Care - HMO

## 2014-10-06 ENCOUNTER — Encounter: Payer: Self-pay | Admitting: Internal Medicine

## 2014-10-06 ENCOUNTER — Ambulatory Visit (INDEPENDENT_AMBULATORY_CARE_PROVIDER_SITE_OTHER): Payer: Commercial Managed Care - HMO | Admitting: Internal Medicine

## 2014-10-06 VITALS — BP 100/58 | HR 53 | Temp 97.8°F | Resp 20 | Ht 67.0 in | Wt 200.0 lb

## 2014-10-06 DIAGNOSIS — E785 Hyperlipidemia, unspecified: Secondary | ICD-10-CM | POA: Diagnosis not present

## 2014-10-06 DIAGNOSIS — E669 Obesity, unspecified: Secondary | ICD-10-CM

## 2014-10-06 DIAGNOSIS — I10 Essential (primary) hypertension: Secondary | ICD-10-CM | POA: Diagnosis not present

## 2014-10-06 DIAGNOSIS — E1122 Type 2 diabetes mellitus with diabetic chronic kidney disease: Secondary | ICD-10-CM | POA: Diagnosis not present

## 2014-10-06 DIAGNOSIS — N189 Chronic kidney disease, unspecified: Secondary | ICD-10-CM

## 2014-10-06 DIAGNOSIS — J449 Chronic obstructive pulmonary disease, unspecified: Secondary | ICD-10-CM

## 2014-10-06 DIAGNOSIS — J4489 Other specified chronic obstructive pulmonary disease: Secondary | ICD-10-CM

## 2014-10-06 DIAGNOSIS — E1169 Type 2 diabetes mellitus with other specified complication: Secondary | ICD-10-CM | POA: Diagnosis not present

## 2014-10-06 MED ORDER — ASPIRIN EC 81 MG PO TBEC
81.0000 mg | DELAYED_RELEASE_TABLET | Freq: Every day | ORAL | Status: DC
Start: 2014-10-06 — End: 2017-12-16

## 2014-10-06 NOTE — Progress Notes (Signed)
Patient ID: Ian [name], male   DOB: 08-22-1935, 79 y.o.   MRN: [id number]   Location:  [facility] / [place] [facility]  Code Status: DNR  No Known Allergies  Chief Complaint  Patient presents with  . Medical Management of Chronic Issues    4 month follow-up discuss [name](copy printed)  . Medication Management    Patient is taking 2 doses of lipitor, please advise    HPI: Patient is a 79 y.o.  White male seen in the office today for med mgt.  Doing well.  Is to be taking lipitor 33m at hs.  Says he's been doing well on his sugar.  Has come down to 6.6 from 6.8.  Still exercising at the gym--just went this am.  Lost 22 lbs and continues to maintain.    Only needs albuterol 1x per month  Kidneys are stable.  No dizziness.  BP very well controlled.    Says he got his zoster vaccine at pharmacy.    Review of Systems:  Review of Systems  Constitutional: Negative for fever and chills.  HENT: Positive for hearing loss. Negative for congestion.        Hearing aides  Eyes: Negative for blurred vision.       Glasses  Respiratory: Negative for cough and shortness of breath.   Cardiovascular: Negative for chest pain and leg swelling.  Gastrointestinal: Positive for heartburn.  Genitourinary: Negative for dysuria, urgency and frequency.  Musculoskeletal: Negative for myalgias and falls.  Skin: Negative for rash.  Neurological: Negative for dizziness.  Psychiatric/Behavioral: Positive for memory loss. Negative for depression.     Past Medical History  Diagnosis Date  . Diabetes mellitus   . Hypertension   . COPD (chronic obstructive pulmonary disease)   . Kidney disease     mild  . Hyperlipidemia   . Hypertrophy of prostate without urinary obstruction and other lower urinary tract symptoms (LUTS)   . Hypercalcemia   . Edema   . Ventral hernia, unspecified, without mention of obstruction or gangrene   . Anxiety   . Depression   . Allergy   .  Unspecified sleep apnea   . Hypertrophy of prostate without urinary obstruction and other lower urinary tract symptoms (LUTS)   . Anemia, unspecified   . Unspecified disorder of kidney and ureter   . Alzheimer's disease   . Chronic maxillary sinusitis     Past Surgical History  Procedure Laterality Date  . Hernia repair    . Eyplosatory lap  2002    Social History:   reports that he quit smoking about 26 years ago. He has never used smokeless tobacco. He reports that he does not drink alcohol or use illicit drugs.  Family History  Problem Relation Age of Onset  . Pancreatic cancer Sister   . Heart disease Father   . Hypertension Brother   . Hypertension Sister     Medications: Patient's Medications  New Prescriptions   No medications on file  Previous Medications   ALBUTEROL (PROVENTIL HFA;VENTOLIN HFA) 108 (90 BASE) MCG/ACT INHALER    Inhale 2 puffs into the lungs every 6 (six) hours as needed for wheezing or shortness of breath.   AMLODIPINE (NORVASC) 10 MG TABLET    Take 1 tablet (10 mg total) by mouth daily.   ATORVASTATIN (LIPITOR) 10 MG TABLET    Take 10 mg by mouth at bedtime.   ATORVASTATIN (LIPITOR) 20 MG TABLET    TAKE 1 TABLET  EVERY DAY   BLOOD GLUCOSE MONITORING SUPPL (ACCU-CHEK AVIVA) DEVICE    Use to test blood Sugar Dx E11.9   BUDESONIDE-FORMOTEROL (SYMBICORT) 80-4.5 MCG/ACT INHALER    Inhale 2 puffs into the lungs 2 (two) times daily.   GLUCOSE BLOOD (ACCU-CHEK AVIVA) TEST STRIP    Use to test blood sugar up to three times daily Dx: E11.9   HYDRALAZINE (APRESOLINE) 25 MG TABLET    Take 1 tablet (25 mg total) by mouth 2 (two) times daily.   LINAGLIPTIN (TRADJENTA) 5 MG TABS TABLET    Take one tablet by mouth once daily to control blood sugar   METOPROLOL (LOPRESSOR) 50 MG TABLET    Take 1 tablet (50 mg total) by mouth 2 (two) times daily.   MONTELUKAST (SINGULAIR) 10 MG TABLET    TAKE 1 TABLET AT BEDTIME   PAROXETINE (PAXIL) 20 MG TABLET    Take one tablet by  mouth once daily for depression   TRAZODONE (DESYREL) 50 MG TABLET    Take one tablet by mouth at bedtime as needed for insomnia  Modified Medications   No medications on file  Discontinued Medications   BLOOD GLUCOSE MONITORING SUPPL (ACCU-CHEK AVIVA PLUS) W/DEVICE KIT         Physical Exam: Filed Vitals:   10/06/14 1044  BP: 100/58  Pulse: 53  Temp: 97.8 F (36.6 C)  TempSrc: Oral  Resp: 20  Height: 5' 7"  (1.702 m)  Weight: 200 lb (90.719 kg)  SpO2: 94%  Physical Exam  Constitutional: He is oriented to person, place, and time. He appears well-developed and well-nourished. No distress.  Cardiovascular: Normal rate, regular rhythm, normal heart sounds and intact distal pulses.   Pulmonary/Chest: Effort normal and breath sounds normal.  Abdominal: Soft. Bowel sounds are normal. He exhibits no distension and no mass. There is no tenderness.  Musculoskeletal: Normal range of motion. He exhibits no edema or tenderness.  Neurological: He is alert and oriented to person, place, and time.  Some short term memory loss  Skin: Skin is warm and dry.  Psychiatric: He has a normal mood and affect.    [name] reviewed: Basic Metabolic Panel:  Recent [name]  01/18/14 0806 05/31/14 0815 09/28/14 0833  NA 142 146* 143  K 4.5 4.5 4.2  CL 105 106 106  CO2 26 27 24   GLUCOSE 154* 151* 147*  BUN 24 25 25   CREATININE 1.45* 1.45* 1.42*  CALCIUM 10.0 9.8 10.0   Liver Function Tests:  Recent [name]  09/28/14 0833  AST 14  ALT 12  ALKPHOS 63  BILITOT 0.4  PROT 6.5   No results for input(s): LIPASE, AMYLASE in the last 8760 hours. No results for input(s): AMMONIA in the last 8760 hours. CBC:  Recent [name]  09/28/14 0833  WBC 5.8  NEUTROABS 3.2  HGB 13.8  HCT 41.3  MCV 93   Lipid Panel:  Recent [name]  01/18/14 0806 05/31/14 0815  CHOL 185 169  HDL 37* 40  LDLCALC 125* 105*  TRIG 116 121  CHOLHDL 5.0 4.2   Lab Results  Component Value Date   HGBA1C 6.6* 09/28/2014     Assessment/Plan 1. Type 2 diabetes mellitus with diabetic chronic kidney disease - well controlled -add baby asa for heart protection - aspirin EC 81 MG tablet; Take 1 tablet (81 mg total) by mouth daily. - cannot take ace due to cough, on hydralazine instead -is losing weight with regular exercise - Hemoglobin A1c; Future - Basic metabolic  panel; Future -cont statin  2. COPD with asthma -cont albuterol prn (uses only monthly) -cont diet and exercise, tradjenta and regular monitoring  3. Essential hypertension -bp at goal, cont lopressor, hydralazine  4. Obesity (BMI 30-39.9) -has lost more than 20 lbs, lost 3 more since last visit 4 mos ago -cont regular exercise and dietary changes  5. Hyperlipidemia associated with type 2 diabetes mellitus -cont lipitor   [name]/tests ordered:   Orders Placed This Encounter  Procedures  . Hemoglobin A1c    Standing Status: Future     Number of Occurrences:      Standing Expiration Date: 06/07/2015  . Basic metabolic panel    Standing Status: Future     Number of Occurrences:      Standing Expiration Date: 06/07/2015    Next appt: 4 mos, [name] before  Chrisie Jankovich L. Ahlaya Ende, D.O. [facility] [address], [place] [address] Cell Phone (Mon-Fri 8am-5pm):  [phone] On Call:  [phone] & follow prompts after 5pm & weekends Office Phone:  [phone] Office Fax:  [phone]

## 2014-10-06 NOTE — Patient Instructions (Addendum)
Please bring a copy of your living will and hcpoa (advance directive) for [place] to scan into your chart.  Please take an enteric coated aspirin daily to protect your heart.

## 2014-10-24 ENCOUNTER — Other Ambulatory Visit: Payer: Self-pay | Admitting: Internal Medicine

## 2014-11-28 ENCOUNTER — Other Ambulatory Visit: Payer: Self-pay | Admitting: Internal Medicine

## 2014-12-22 ENCOUNTER — Ambulatory Visit (INDEPENDENT_AMBULATORY_CARE_PROVIDER_SITE_OTHER): Payer: Commercial Managed Care - HMO | Admitting: Nurse Practitioner

## 2014-12-22 ENCOUNTER — Encounter: Payer: Self-pay | Admitting: Nurse Practitioner

## 2014-12-22 VITALS — BP 112/74 | HR 55 | Temp 98.1°F | Resp 18 | Ht 67.0 in | Wt 200.8 lb

## 2014-12-22 DIAGNOSIS — J209 Acute bronchitis, unspecified: Secondary | ICD-10-CM | POA: Diagnosis not present

## 2014-12-22 MED ORDER — AMOXICILLIN-POT CLAVULANATE 875-125 MG PO TABS: 1.0000 | ORAL_TABLET | Freq: Two times a day (BID) | ORAL | Status: DC

## 2014-12-22 MED ORDER — ACCU-CHEK AVIVA DEVI: Status: DC

## 2014-12-22 NOTE — Progress Notes (Signed)
Patient ID: Ian [name], male   DOB: [birth year], 79 y.o.   MRN: [id number]    PCP: Hollace Kinnier, DO  Allergies  Allergen Reactions  . Ace Inhibitors Cough    No chief complaint on file.    HPI: Patient is a 79 y.o. male seen in the office today for cough and congestion for over a week. Coughing up greenish mucous, not getting better. Drinking water, hot "stuff", sore throat, chest and nasal congestion.  Had allergies as well.  No fevers or chills.  Does not feel awful but does not feel good.  Eating and drinking well.  No shortness of breath or chest pain.  Review of Systems:  Review of Systems  Constitutional: Negative for activity change, appetite change, fatigue and unexpected weight change.  HENT: Positive for congestion and sinus pressure. Negative for hearing loss.   Eyes: Negative.   Respiratory: Positive for cough. Negative for shortness of breath and wheezing.   Cardiovascular: Negative for chest pain, palpitations and leg swelling.  Gastrointestinal: Negative for diarrhea and constipation.  Neurological: Negative for dizziness, weakness, light-headedness and headaches.    Past Medical History  Diagnosis Date  . Diabetes mellitus   . Hypertension   . COPD (chronic obstructive pulmonary disease)   . Kidney disease     mild  . Hyperlipidemia   . Hypertrophy of prostate without urinary obstruction and other lower urinary tract symptoms (LUTS)   . Hypercalcemia   . Edema   . Ventral hernia, unspecified, without mention of obstruction or gangrene   . Anxiety   . Depression   . Allergy   . Unspecified sleep apnea   . Hypertrophy of prostate without urinary obstruction and other lower urinary tract symptoms (LUTS)   . Anemia, unspecified   . Unspecified disorder of kidney and ureter   . Alzheimer's disease   . Chronic maxillary sinusitis    Past Surgical History  Procedure Laterality Date  . Hernia repair    . Eyplosatory lap  2002   Social History:  reports that he quit smoking about 26 years ago. He has never used smokeless tobacco. He reports that he does not drink alcohol or use illicit drugs.  Family History  Problem Relation Age of Onset  . Pancreatic cancer Sister   . Heart disease Father   . Hypertension Brother   . Hypertension Sister     Medications: Patient's Medications  New Prescriptions   No medications on file  Previous Medications   ALBUTEROL (PROVENTIL HFA;VENTOLIN HFA) 108 (90 BASE) MCG/ACT INHALER    Inhale 2 puffs into the lungs every 6 (six) hours as needed for wheezing or shortness of breath.   AMLODIPINE (NORVASC) 10 MG TABLET    TAKE 1 TABLET EVERY DAY   ASPIRIN EC 81 MG TABLET    Take 1 tablet (81 mg total) by mouth daily.   ATORVASTATIN (LIPITOR) 20 MG TABLET    TAKE 1 TABLET EVERY DAY   BLOOD GLUCOSE MONITORING SUPPL (ACCU-CHEK AVIVA) DEVICE    Use to test blood Sugar Dx E11.9   BUDESONIDE-FORMOTEROL (SYMBICORT) 80-4.5 MCG/ACT INHALER    Inhale 2 puffs into the lungs 2 (two) times daily.   GLUCOSE BLOOD (ACCU-CHEK AVIVA) TEST STRIP    Use to test blood sugar up to three times daily Dx: E11.9   HYDRALAZINE (APRESOLINE) 25 MG TABLET    Take 1 tablet (25 mg total) by mouth 2 (two) times daily.   LINAGLIPTIN (TRADJENTA) 5 MG TABS TABLET  Take one tablet by mouth once daily to control blood sugar   METOPROLOL (LOPRESSOR) 50 MG TABLET    Take 1 tablet (50 mg total) by mouth 2 (two) times daily.   MONTELUKAST (SINGULAIR) 10 MG TABLET    TAKE 1 TABLET AT BEDTIME   PAROXETINE (PAXIL) 20 MG TABLET    TAKE 1 TABLET ONE TIME DAILY  FOR  DEPRESSION   TRAZODONE (DESYREL) 50 MG TABLET    TAKE 1 TABLET AT BEDTIME AS NEEDED  FOR  INSOMNIA  Modified Medications   No medications on file  Discontinued Medications   No medications on file     Physical Exam:  Filed Vitals:   12/22/14 0849  BP: 112/74  Pulse: 55  Temp: 98.1 F (36.7 C)  TempSrc: Oral  Resp: 18  Height: 5\' 7"  (1.702 m)  Weight: 200 lb 12.8 oz  (91.082 kg)  SpO2: 92%    Physical Exam  Constitutional: He is oriented to person, place, and time. He appears well-developed and well-nourished. No distress.  HENT:  Head: Normocephalic and atraumatic.  Right Ear: External ear normal.  Left Ear: External ear normal.  Nose: Nose normal.  Mouth/Throat: Oropharynx is clear and moist. No oropharyngeal exudate.  Eyes: Conjunctivae and EOM are normal. Pupils are equal, round, and reactive to light.  Neck: Normal range of motion. Neck supple.  Cardiovascular: Normal rate, regular rhythm and normal heart sounds.   Pulmonary/Chest: Effort normal and breath sounds normal.  Abdominal: Soft. Bowel sounds are normal.  Neurological: He is alert and oriented to person, place, and time.  Skin: Skin is warm and dry. He is not diaphoretic.  Psychiatric: He has a normal mood and affect.    [name] reviewed: Basic Metabolic Panel:  Recent [name]  01/18/14 0806 05/31/14 0815 09/28/14 0833  NA 142 146* 143  K 4.5 4.5 4.2  CL 105 106 106  CO2 26 27 24   GLUCOSE 154* 151* 147*  BUN 24 25 25   CREATININE 1.45* 1.45* 1.42*  CALCIUM 10.0 9.8 10.0   Liver Function Tests:  Recent [name]  09/28/14 0833  AST 14  ALT 12  ALKPHOS 63  BILITOT 0.4  PROT 6.5   No results for input(s): LIPASE, AMYLASE in the last 8760 hours. No results for input(s): AMMONIA in the last 8760 hours. CBC:  Recent [name]  09/28/14 0833  WBC 5.8  NEUTROABS 3.2  HGB 13.8  HCT 41.3  MCV 93   Lipid Panel:  Recent [name]  01/18/14 0806 05/31/14 0815  CHOL 185 169  HDL 37* 40  LDLCALC 125* 105*  TRIG 116 121  CHOLHDL 5.0 4.2   TSH: No results for input(s): TSH in the last 8760 hours. A1C: Lab Results  Component Value Date   HGBA1C 6.6* 09/28/2014     Assessment/Plan  1. Acute bronchitis, unspecified organism Tylenol 650 mg every 6 hours while awake as needed  for sore throat  mucinex DM by mouth twice daily for 1 week  Augmentin 875-125mg  by mouth twice  daily for 1 week - amoxicillin-clavulanate (AUGMENTIN) 875-125 MG per tablet; Take 1 tablet by mouth 2 (two) times daily.  Dispense: 14 tablet; Refill: 0 Follow up precautions discussed

## 2014-12-22 NOTE — Patient Instructions (Signed)
Tylenol 650 mg every 6 hours while awake as needed  for sore throat  mucinex DM by mouth twice daily for 1 week  Augmentin 875-125mg  by mouth twice daily for 1 week   Acute Bronchitis Bronchitis is inflammation of the airways that extend from the windpipe into the lungs (bronchi). The inflammation often causes mucus to develop. This leads to a cough, which is the most common symptom of bronchitis.  In acute bronchitis, the condition usually develops suddenly and goes away over time, usually in a couple weeks. Smoking, allergies, and asthma can make bronchitis worse. Repeated episodes of bronchitis may cause further lung problems.  CAUSES Acute bronchitis is most often caused by the same virus that causes a cold. The virus can spread from person to person (contagious) through coughing, sneezing, and touching contaminated objects. SIGNS AND SYMPTOMS   Cough.   Fever.   Coughing up mucus.   Body aches.   Chest congestion.   Chills.   Shortness of breath.   Sore throat.  DIAGNOSIS  Acute bronchitis is usually diagnosed through a physical exam. Your health care provider will also ask you questions about your medical history. Tests, such as chest X-rays, are sometimes done to rule out other conditions.  TREATMENT  Acute bronchitis usually goes away in a couple weeks. Oftentimes, no medical treatment is necessary. Medicines are sometimes given for relief of fever or cough. Antibiotic medicines are usually not needed but may be prescribed in certain situations. In some cases, an inhaler may be recommended to help reduce shortness of breath and control the cough. A cool mist vaporizer may also be used to help thin bronchial secretions and make it easier to clear the chest.  HOME CARE INSTRUCTIONS  Get plenty of rest.   Drink enough fluids to keep your urine clear or pale yellow (unless you have a medical condition that requires fluid restriction). Increasing fluids may help thin  your respiratory secretions (sputum) and reduce chest congestion, and it will prevent dehydration.   Take medicines only as directed by your health care provider.  If you were prescribed an antibiotic medicine, finish it all even if you start to feel better.  Avoid smoking and secondhand smoke. Exposure to cigarette smoke or irritating chemicals will make bronchitis worse. If you are a smoker, consider using nicotine gum or skin patches to help control withdrawal symptoms. Quitting smoking will help your lungs heal faster.   Reduce the chances of another bout of acute bronchitis by washing your hands frequently, avoiding people with cold symptoms, and trying not to touch your hands to your mouth, nose, or eyes.   Keep all follow-up visits as directed by your health care provider.  SEEK MEDICAL CARE IF: Your symptoms do not improve after 1 week of treatment.  SEEK IMMEDIATE MEDICAL CARE IF:  You develop an increased fever or chills.   You have chest pain.   You have severe shortness of breath.  You have bloody sputum.   You develop dehydration.  You faint or repeatedly feel like you are going to pass out.  You develop repeated vomiting.  You develop a severe headache. MAKE SURE YOU:   Understand these instructions.  Will watch your condition.  Will get help right away if you are not doing well or get worse. Document Released: 08/01/2004 Document Revised: 11/08/2013 Document Reviewed: 12/15/2012 [facility] Patient Information 2015 [place], [place]. This information is not intended to replace advice given to you by your health care provider. Make  sure you discuss any questions you have with your health care provider.  

## 2014-12-23 ENCOUNTER — Other Ambulatory Visit: Payer: Self-pay | Admitting: Internal Medicine

## 2015-02-02 ENCOUNTER — Other Ambulatory Visit: Payer: Commercial Managed Care - HMO

## 2015-02-02 DIAGNOSIS — E1122 Type 2 diabetes mellitus with diabetic chronic kidney disease: Secondary | ICD-10-CM

## 2015-02-03 LAB — BASIC METABOLIC PANEL
BUN/Creatinine Ratio: 16 (ref 10–22)
BUN: 27 mg/dL (ref 8–27)
CO2: 22 mmol/L (ref 18–29)
Calcium: 9.9 mg/dL (ref 8.6–10.2)
Chloride: 105 mmol/L (ref 97–108)
Creatinine, Ser: 1.71 mg/dL — ABNORMAL HIGH (ref 0.76–1.27)
GFR calc Af Amer: 44 mL/min/{1.73_m2} — ABNORMAL LOW (ref >59)
GFR calc non Af Amer: 38 mL/min/{1.73_m2} — ABNORMAL LOW (ref >59)
Glucose: 130 mg/dL — ABNORMAL HIGH (ref 65–99)
Potassium: 4.3 mmol/L (ref 3.5–5.2)
Sodium: 142 mmol/L (ref 134–144)

## 2015-02-03 LAB — HEMOGLOBIN A1C
Est. average glucose Bld gHb Est-mCnc: 140 mg/dL
Hgb A1c MFr Bld: 6.5 % — ABNORMAL HIGH (ref 4.8–5.6)

## 2015-02-06 ENCOUNTER — Encounter: Payer: Self-pay | Admitting: Internal Medicine

## 2015-02-06 ENCOUNTER — Ambulatory Visit (INDEPENDENT_AMBULATORY_CARE_PROVIDER_SITE_OTHER): Payer: Commercial Managed Care - HMO | Admitting: Internal Medicine

## 2015-02-06 VITALS — BP 120/58 | HR 63 | Temp 97.5°F | Resp 20 | Ht 67.0 in | Wt 200.2 lb

## 2015-02-06 DIAGNOSIS — E1169 Type 2 diabetes mellitus with other specified complication: Secondary | ICD-10-CM | POA: Diagnosis not present

## 2015-02-06 DIAGNOSIS — J449 Chronic obstructive pulmonary disease, unspecified: Secondary | ICD-10-CM | POA: Diagnosis not present

## 2015-02-06 DIAGNOSIS — E785 Hyperlipidemia, unspecified: Secondary | ICD-10-CM | POA: Diagnosis not present

## 2015-02-06 DIAGNOSIS — E669 Obesity, unspecified: Secondary | ICD-10-CM | POA: Diagnosis not present

## 2015-02-06 DIAGNOSIS — N189 Chronic kidney disease, unspecified: Secondary | ICD-10-CM

## 2015-02-06 DIAGNOSIS — N183 Chronic kidney disease, stage 3 (moderate): Secondary | ICD-10-CM | POA: Diagnosis not present

## 2015-02-06 DIAGNOSIS — J4489 Other specified chronic obstructive pulmonary disease: Secondary | ICD-10-CM

## 2015-02-06 DIAGNOSIS — E1122 Type 2 diabetes mellitus with diabetic chronic kidney disease: Secondary | ICD-10-CM

## 2015-02-06 DIAGNOSIS — N1831 Chronic kidney disease, stage 3a: Secondary | ICD-10-CM

## 2015-02-06 DIAGNOSIS — I10 Essential (primary) hypertension: Secondary | ICD-10-CM

## 2015-02-06 NOTE — Progress Notes (Signed)
Patient ID: Ian [name], male   DOB: May 27, 1936, 79 y.o.   MRN: [id number]   Location:  [facility]  Code Status: DNR Goals of Care: Advanced Directive information Does patient have an advance directive?: Yes, Type of Advance Directive: [place];Living will;Out of facility DNR (pink MOST or yellow form), Pre-existing out of facility DNR order (yellow form or pink MOST form): Yellow form placed in chart (order not valid for inpatient use), Does patient want to make changes to advanced directive?: No - Patient declined   Chief Complaint  Patient presents with  . Medical Management of Chronic Issues    HPI: Patient is a 79 y.o. white male seen in the office today for med mgt of his chronic diseases.  His wife is with him today.  He says he is doing well.  He has his hearing aides in and can hear well today.  Hba1c has gotten better at 6.5.  Continues to cut back on his carbs.  Still going to the gym 5 days a week.  Checks sugar before breakfast and after breakfast daily.    Kidney function bumped a little and we recommended he improve his hydration.  GFR 38 this time.  Not depressed.  His wife says it's too good.  Sleeping well at night.    2-3x nocturia.  Unchanged.  Does not even wake his wife.    No difficulty with his breathing since his acute bronchitis midJune.  Hasn't used his proventil since then.  Allergies/chronic sinusitis--has good and bad days.  Takes otc allergy med off and on when needed.  Hyperlipidemia:  Last LDL was at 105.  Working on diet and exercise.  Review of Systems:  Review of Systems  Constitutional: Negative for fever, chills and malaise/fatigue.  HENT: Positive for congestion.   Eyes: Negative for blurred vision.       Glasses  Respiratory: Negative for cough, sputum production, shortness of breath and wheezing.   Cardiovascular: Negative for chest pain.  Gastrointestinal: Negative for  abdominal pain.  Genitourinary: Negative for dysuria, urgency and frequency.  Skin: Negative for itching and rash.  Neurological: Negative for dizziness, loss of consciousness and weakness.  Endo/Heme/Allergies: Positive for environmental allergies.  Psychiatric/Behavioral: Positive for memory loss. Negative for depression. The patient is not nervous/anxious and does not have insomnia.        Seems memory loss stabilized    Past Medical History  Diagnosis Date  . Diabetes mellitus   . Hypertension   . COPD (chronic obstructive pulmonary disease)   . Kidney disease     mild  . Hyperlipidemia   . Hypertrophy of prostate without urinary obstruction and other lower urinary tract symptoms (LUTS)   . Hypercalcemia   . Edema   . Ventral hernia, unspecified, without mention of obstruction or gangrene   . Anxiety   . Depression   . Allergy   . Unspecified sleep apnea   . Hypertrophy of prostate without urinary obstruction and other lower urinary tract symptoms (LUTS)   . Anemia, unspecified   . Unspecified disorder of kidney and ureter   . Alzheimer's disease   . Chronic maxillary sinusitis     Past Surgical History  Procedure Laterality Date  . Hernia repair    . Eyplosatory lap  2002    Allergies  Allergen Reactions  . Ace Inhibitors Cough   Medications: Patient's Medications  New Prescriptions   No medications on file  Previous Medications   ALBUTEROL (PROVENTIL HFA;VENTOLIN HFA) 108 (90 BASE) MCG/ACT INHALER    Inhale 2 puffs into the lungs every 6 (six) hours as needed for wheezing or shortness of breath.   AMLODIPINE (NORVASC) 10 MG TABLET    TAKE 1 TABLET EVERY DAY   ASPIRIN EC 81 MG TABLET    Take 1 tablet (81 mg total) by mouth daily.   ATORVASTATIN (LIPITOR) 20 MG TABLET    TAKE 1 TABLET EVERY DAY   BLOOD GLUCOSE MONITORING SUPPL (ACCU-CHEK AVIVA) DEVICE    Use to test blood Sugar Dx E11.9   BUDESONIDE-FORMOTEROL (SYMBICORT) 80-4.5 MCG/ACT INHALER    Inhale 2  puffs into the lungs 2 (two) times daily.   GLUCOSE BLOOD (ACCU-CHEK AVIVA) TEST STRIP    Use to test blood sugar up to three times daily Dx: E11.9   HYDRALAZINE (APRESOLINE) 25 MG TABLET    TAKE 1 TABLET TWICE DAILY   METOPROLOL (LOPRESSOR) 50 MG TABLET    Take 1 tablet (50 mg total) by mouth 2 (two) times daily.   MONTELUKAST (SINGULAIR) 10 MG TABLET    TAKE 1 TABLET AT BEDTIME   PAROXETINE (PAXIL) 20 MG TABLET    TAKE 1 TABLET ONE TIME DAILY  FOR  DEPRESSION   TRADJENTA 5 MG TABS TABLET    TAKE 1 TABLET ONE TIME DAILY TO CONTROL BLOOD SUGAR   TRAZODONE (DESYREL) 50 MG TABLET    TAKE 1 TABLET AT BEDTIME AS NEEDED  FOR  INSOMNIA  Modified Medications   No medications on file  Discontinued Medications   AMOXICILLIN-CLAVULANATE (AUGMENTIN) 875-125 MG PER TABLET    Take 1 tablet by mouth 2 (two) times daily.    Physical Exam: Filed Vitals:   02/06/15 1313  BP: 120/58  Pulse: 63  Temp: 97.5 F (36.4 C)  TempSrc: Oral  Resp: 20  Height: 5\' 7"  (1.702 m)  Weight: 200 lb 3.2 oz (90.81 kg)  SpO2: 91%   Physical Exam  Constitutional: He is oriented to person, place, and time. He appears well-developed and well-nourished. No distress.  Obese white male  HENT:  Head: Normocephalic and atraumatic.  HOH--has hearing aides  Eyes:  glasses  Cardiovascular: Normal rate, regular rhythm, normal heart sounds and intact distal pulses.   Pulmonary/Chest: Effort normal and breath sounds normal. No respiratory distress.  Abdominal: Soft. Bowel sounds are normal. He exhibits no distension. There is no tenderness.  Musculoskeletal: Normal range of motion.  Neurological: He is alert and oriented to person, place, and time.  Skin: Skin is warm and dry.    [name] reviewed: Basic Metabolic Panel:  Recent [name]  05/31/14 0815 09/28/14 0833 02/02/15 0825  NA 146* 143 142  K 4.5 4.2 4.3  CL 106 106 105  CO2 27 24 22   GLUCOSE 151* 147* 130*  BUN 25 25 27   CREATININE 1.45* 1.42* 1.71*  CALCIUM  9.8 10.0 9.9   Liver Function Tests:  Recent [name]  09/28/14 0833  AST 14  ALT 12  ALKPHOS 63  BILITOT 0.4  PROT 6.5   No results for input(s): LIPASE, AMYLASE in the last 8760 hours. No results for input(s): AMMONIA in the last 8760 hours. CBC:  Recent [name]  09/28/14 0833  WBC 5.8  NEUTROABS 3.2  HGB 13.8  HCT 41.3  MCV 93   Lipid Panel:  Recent [name]  05/31/14 0815  CHOL 169  HDL 40  LDLCALC 105*  TRIG 121  CHOLHDL 4.2   Lab  Results  Component Value Date   HGBA1C 6.5* 02/02/2015   Reviewed note from [place] when he was seen for bronchitis.  Assessment/Plan 1. Type 2 diabetes mellitus with diabetic chronic kidney disease - well controlled with diet, exercise, tradjenta, lipitor and baby asa - cont these and reassess before next visit - Microalbumin/Creatinine Ratio, Urine - Hemoglobin A1c; Future - Lipid panel; Future - Basic metabolic panel; Future  2. Chronic kidney disease (CKD) stage G3a/A2, moderately decreased glomerular filtration rate (GFR) between 45-59 mL/min/1.73 square meter and albuminuria creatinine ratio between 30-299 mg/g - renal function looked a little worse and has not been taking nsaids and no one else added any new meds--admits he probably did not drink much water that day--encouraged 6-8 8oz glasses per day -he is on hydralazine as he had an allergy to ace inhibitors (cough) - Microalbumin/Creatinine Ratio, Urine - Basic metabolic panel; Future  3. COPD with asthma -stable since he got over his acute bronchitis--totally asymptomatic -cont singulair, symbicort, prn albuterol  4. Essential hypertension - bp well controlled with hydralazine--continue - Basic metabolic panel; Future  5. Obesity (BMI 30-39.9) -continues regular visits to the gym  -suspect the culprit is his breakfast outings with his friends -cont the exercise and dietary changes he's made most of the time  6. Hyperlipidemia associated with type 2 diabetes  mellitus -in Nov, his LDL was 105--goal <70 due to diabetes - reassess Lipid panel; Future before next visit -cont lipitor 20mg  for now, diet and exercise  [name]/tests ordered: Orders Placed This Encounter  Procedures  . Microalbumin/Creatinine Ratio, Urine  . Hemoglobin A1c    Standing Status: Future     Number of Occurrences:      Standing Expiration Date: 08/09/2015  . Lipid panel    Standing Status: Future     Number of Occurrences:      Standing Expiration Date: 08/09/2015    Order Specific Question:  Has the patient fasted?    Answer:  Yes  . Basic metabolic panel    Standing Status: Future     Number of Occurrences:      Standing Expiration Date: 08/09/2015    Order Specific Question:  Has the patient fasted?    Answer:  Yes    Next appt:  3 mos for annual exam with [name] before  [place]. Karrissa Parchment, D.O. [facility] [address], [place] [address] Cell Phone (Mon-Fri 8am-5pm):  [phone] On Call:  [phone] & follow prompts after 5pm & weekends Office Phone:  [phone] Office Fax:  [phone]

## 2015-02-07 LAB — MICROALBUMIN / CREATININE URINE RATIO
Creatinine, Urine: 96.8 mg/dL
MICROALB/CREAT RATIO: 1393.7 mg/g creat — ABNORMAL HIGH (ref 0.0–30.0)
Microalbumin, Urine: 1349.1 ug/mL

## 2015-03-06 ENCOUNTER — Encounter: Payer: Self-pay | Admitting: Internal Medicine

## 2015-03-06 ENCOUNTER — Ambulatory Visit (INDEPENDENT_AMBULATORY_CARE_PROVIDER_SITE_OTHER): Payer: Commercial Managed Care - HMO | Admitting: Internal Medicine

## 2015-03-06 VITALS — BP 110/60 | HR 52 | Temp 97.6°F | Resp 16 | Ht 67.0 in | Wt 200.0 lb

## 2015-03-06 DIAGNOSIS — T162XXA Foreign body in left ear, initial encounter: Secondary | ICD-10-CM

## 2015-03-06 DIAGNOSIS — H6120 Impacted cerumen, unspecified ear: Secondary | ICD-10-CM

## 2015-03-06 DIAGNOSIS — H6123 Impacted cerumen, bilateral: Secondary | ICD-10-CM

## 2015-03-06 DIAGNOSIS — H918X9 Other specified hearing loss, unspecified ear: Secondary | ICD-10-CM

## 2015-03-06 NOTE — Progress Notes (Signed)
Patient ID: Ian [name], male   DOB: August 22, 1935, 79 y.o.   MRN: [id number]   Location:  [facility]  Code Status: DNR Goals of Care: Advanced Directive information Does patient have an advance directive?: Yes, Type of Advance Directive: [place], Does patient want to make changes to advanced directive?: No - Patient declined   Chief Complaint  Patient presents with  . Acute Visit    Wax build up    HPI: Patient is a 79 y.o. white male seen in the office today for an acute visit for severe wax build-up in his ears.  When CMA looked in his left ear, she saw a small "speaker" from an earpiece present in the large amount of wax that was occluding his canal.  His right ear was also full of wax.    Review of Systems:  Review of Systems  HENT: Positive for hearing loss. Negative for congestion, ear discharge and ear pain.        Cerumen impaction  Neurological: Negative for dizziness and headaches.  Psychiatric/Behavioral: Positive for memory loss.    Past Medical History  Diagnosis Date  . Diabetes mellitus   . Hypertension   . COPD (chronic obstructive pulmonary disease)   . Kidney disease     mild  . Hyperlipidemia   . Hypertrophy of prostate without urinary obstruction and other lower urinary tract symptoms (LUTS)   . Hypercalcemia   . Edema   . Ventral hernia, unspecified, without mention of obstruction or gangrene   . Anxiety   . Depression   . Allergy   . Unspecified sleep apnea   . Hypertrophy of prostate without urinary obstruction and other lower urinary tract symptoms (LUTS)   . Anemia, unspecified   . Unspecified disorder of kidney and ureter   . Alzheimer's disease   . Chronic maxillary sinusitis     Past Surgical History  Procedure Laterality Date  . Hernia repair    . Eyplosatory lap  2002    Allergies  Allergen Reactions  . Ace Inhibitors Cough   Medications: Patient's Medications  New  Prescriptions   No medications on file  Previous Medications   ALBUTEROL (PROVENTIL HFA;VENTOLIN HFA) 108 (90 BASE) MCG/ACT INHALER    Inhale 2 puffs into the lungs every 6 (six) hours as needed for wheezing or shortness of breath.   AMLODIPINE (NORVASC) 10 MG TABLET    TAKE 1 TABLET EVERY DAY   ASPIRIN EC 81 MG TABLET    Take 1 tablet (81 mg total) by mouth daily.   ATORVASTATIN (LIPITOR) 20 MG TABLET    TAKE 1 TABLET EVERY DAY   BLOOD GLUCOSE MONITORING SUPPL (ACCU-CHEK AVIVA) DEVICE    Use to test blood Sugar Dx E11.9   BUDESONIDE-FORMOTEROL (SYMBICORT) 80-4.5 MCG/ACT INHALER    Inhale 2 puffs into the lungs 2 (two) times daily.   GLUCOSE BLOOD (ACCU-CHEK AVIVA) TEST STRIP    Use to test blood sugar up to three times daily Dx: E11.9   HYDRALAZINE (APRESOLINE) 25 MG TABLET    TAKE 1 TABLET TWICE DAILY   METOPROLOL (LOPRESSOR) 50 MG TABLET    Take 1 tablet (50 mg total) by mouth 2 (two) times daily.   MONTELUKAST (SINGULAIR) 10 MG TABLET    TAKE 1 TABLET AT BEDTIME   PAROXETINE (PAXIL) 20 MG TABLET    TAKE 1 TABLET ONE TIME DAILY  FOR  DEPRESSION   TRADJENTA 5 MG TABS  TABLET    TAKE 1 TABLET ONE TIME DAILY TO CONTROL BLOOD SUGAR   TRAZODONE (DESYREL) 50 MG TABLET    TAKE 1 TABLET AT BEDTIME AS NEEDED  FOR  INSOMNIA  Modified Medications   No medications on file  Discontinued Medications   No medications on file    Physical Exam: Filed Vitals:   03/06/15 1120  BP: 110/60  Pulse: 52  Temp: 97.6 F (36.4 C)  TempSrc: Oral  Resp: 16  Height: 5\' 7"  (1.702 m)  Weight: 200 lb (90.719 kg)  SpO2: 94%   Physical Exam  Constitutional: He appears well-developed and well-nourished. No distress.  HENT:  Head: Normocephalic and atraumatic.  Right Ear: External ear normal.  Left Ear: External ear normal.  Left ear with smaller than pencil eraser sized white speaker (? From hearing aide vs. Earphones) stuck in large volume of dark hard cerumen; right ear also with cerumen impaction     [name] reviewed: Basic Metabolic Panel:  Recent [name]  05/31/14 0815 09/28/14 0833 02/02/15 0825  NA 146* 143 142  K 4.5 4.2 4.3  CL 106 106 105  CO2 27 24 22   GLUCOSE 151* 147* 130*  BUN 25 25 27   CREATININE 1.45* 1.42* 1.71*  CALCIUM 9.8 10.0 9.9   Liver Function Tests:  Recent [name]  09/28/14 0833  AST 14  ALT 12  ALKPHOS 63  BILITOT 0.4  PROT 6.5   No results for input(s): LIPASE, AMYLASE in the last 8760 hours. No results for input(s): AMMONIA in the last 8760 hours. CBC:  Recent [name]  09/28/14 0833  WBC 5.8  NEUTROABS 3.2  HGB 13.8  HCT 41.3  MCV 93   Lipid Panel:  Recent [name]  05/31/14 0815  CHOL 169  HDL 40  LDLCALC 105*  TRIG 121  CHOLHDL 4.2   Lab Results  Component Value Date   HGBA1C 6.5* 02/02/2015    Assessment/Plan 1. Bilateral hearing loss due to cerumen impaction 2. Foreign body in left ear, initial encounter Instrumentation by me--plastic curette used to remove the small speaker from the left ear and some cerumen in both ears and flushing by cma performed bilaterally Pt was then able to hear better Counseled on checking hearing aides and headphones for all parts being intact when he removes them from his ears  Next appt: keep regular appt as scheduled  Evanee Lubrano L. Myers [place], D.O. [facility] [address], [place] [address] Cell Phone (Mon-Fri 8am-5pm):  [phone] On Call:  [phone] & follow prompts after 5pm & weekends Office Phone:  [phone] Office Fax:  [phone]

## 2015-03-06 NOTE — Patient Instructions (Signed)
Bring copy of [place] [facility] [place] paperwork

## 2015-04-17 ENCOUNTER — Ambulatory Visit (INDEPENDENT_AMBULATORY_CARE_PROVIDER_SITE_OTHER): Payer: Commercial Managed Care - HMO | Admitting: Internal Medicine

## 2015-04-17 ENCOUNTER — Encounter: Payer: Self-pay | Admitting: Internal Medicine

## 2015-04-17 VITALS — BP 120/80 | HR 55 | Temp 97.8°F | Resp 20 | Ht 67.0 in | Wt 197.4 lb

## 2015-04-17 DIAGNOSIS — J441 Chronic obstructive pulmonary disease with (acute) exacerbation: Secondary | ICD-10-CM

## 2015-04-17 DIAGNOSIS — E1122 Type 2 diabetes mellitus with diabetic chronic kidney disease: Secondary | ICD-10-CM | POA: Diagnosis not present

## 2015-04-17 DIAGNOSIS — N183 Chronic kidney disease, stage 3 unspecified: Secondary | ICD-10-CM

## 2015-04-17 DIAGNOSIS — J209 Acute bronchitis, unspecified: Secondary | ICD-10-CM | POA: Diagnosis not present

## 2015-04-17 MED ORDER — AZITHROMYCIN 250 MG PO TABS: ORAL_TABLET | ORAL | Status: DC

## 2015-04-17 MED ORDER — IPRATROPIUM-ALBUTEROL 0.5-2.5 (3) MG/3ML IN SOLN: 3.0000 mL | Freq: Four times a day (QID) | RESPIRATORY_TRACT | Status: DC | PRN

## 2015-04-17 NOTE — Progress Notes (Signed)
Patient ID: Ian [name], male   DOB: 14-Jul-1935, 79 y.o.   MRN: [id number]   Location: [place] Provider: Rexene Edison. Mariea [name], D.O., C.M.D.  Code Status: DNR Goals of Care: Advanced Directive information Does patient have an advance directive?: Yes, Type of Advance Directive: [place], Does patient want to make changes to advanced directive?: No - Patient declined  Chief Complaint  Patient presents with  . Acute Visit    chest cold and cough x 5 days    HPI: Patient is a 79 y.o. male seen in the office today for an acute visit with chest cold and cough for 5 days.  Coughing.  Has been drinking hot teas.  Feels a little better today.  Mucus is thick and has been yellow at first, but now white.  Was wheezing first and second day a little bit.  Used his inhaler 3x in 5 days since he's been sick.  His wife notes, he got much better yesterday, but then coughed all night long last night.  Had some chills at first.  Last night, he was hot and sweaty.  A little ear pain on right the other night.  No headache.   He's been taking advil, tylenol, tussin DM.  BP is actually not high.  Discussed avoiding advil and medications with D due to his kidneys and blood pressure respectively.  Wt 197 down from 200 lbs.  Hasn't been to the gym since he's been sick.    Encouraged walking outside in good weather to get fresh air and regular handwashing to prevent sickness.      Review of Systems:  Review of Systems  Constitutional: Positive for chills. Negative for fever and malaise/fatigue.  HENT: Positive for congestion and ear pain.   Respiratory: Positive for cough, sputum production and wheezing. Negative for hemoptysis and shortness of breath.   Cardiovascular: Negative for chest pain.  Gastrointestinal: Negative for nausea, vomiting and diarrhea.  Genitourinary: Negative for dysuria.  Musculoskeletal: Negative for falls.  Neurological: Negative for dizziness, weakness and  headaches.  Psychiatric/Behavioral: Positive for memory loss.    Past Medical History  Diagnosis Date  . Diabetes mellitus   . Hypertension   . COPD (chronic obstructive pulmonary disease) ([place])   . Kidney disease     mild  . Hyperlipidemia   . Hypertrophy of prostate without urinary obstruction and other lower urinary tract symptoms (LUTS)   . Hypercalcemia   . Edema   . Ventral hernia, unspecified, without mention of obstruction or gangrene   . Anxiety   . Depression   . Allergy   . Unspecified sleep apnea   . Hypertrophy of prostate without urinary obstruction and other lower urinary tract symptoms (LUTS)   . Anemia, unspecified   . Unspecified disorder of kidney and ureter   . Alzheimer's disease   . Chronic maxillary sinusitis     Past Surgical History  Procedure Laterality Date  . Hernia repair    . Eyplosatory lap  2002    Allergies  Allergen Reactions  . Ace Inhibitors Cough      Medication List       This list is accurate as of: 04/17/15 11:53 AM.  Always use your most recent med list.               ACCU-CHEK AVIVA device  Use to test blood Sugar Dx E11.9     albuterol 108 (90 BASE) MCG/ACT inhaler  Commonly known as:  PROVENTIL  HFA;VENTOLIN HFA  Inhale 2 puffs into the lungs every 6 (six) hours as needed for wheezing or shortness of breath.     amLODipine 10 MG tablet  Commonly known as:  NORVASC  TAKE 1 TABLET EVERY DAY     aspirin EC 81 MG tablet  Take 1 tablet (81 mg total) by mouth daily.     atorvastatin 20 MG tablet  Commonly known as:  LIPITOR  TAKE 1 TABLET EVERY DAY     budesonide-formoterol 80-4.5 MCG/ACT inhaler  Commonly known as:  SYMBICORT  Inhale 2 puffs into the lungs 2 (two) times daily.     glucose blood test strip  Commonly known as:  ACCU-CHEK AVIVA  Use to test blood sugar up to three times daily Dx: E11.9     hydrALAZINE 25 MG tablet  Commonly known as:  APRESOLINE  TAKE 1 TABLET TWICE DAILY     metoprolol  50 MG tablet  Commonly known as:  LOPRESSOR  Take 1 tablet (50 mg total) by mouth 2 (two) times daily.     montelukast 10 MG tablet  Commonly known as:  SINGULAIR  TAKE 1 TABLET AT BEDTIME     PARoxetine 20 MG tablet  Commonly known as:  PAXIL  TAKE 1 TABLET ONE TIME DAILY  FOR  DEPRESSION     TRADJENTA 5 MG Tabs tablet  Generic drug:  linagliptin  TAKE 1 TABLET ONE TIME DAILY TO CONTROL BLOOD SUGAR     traZODone 50 MG tablet  Commonly known as:  DESYREL  TAKE 1 TABLET AT BEDTIME AS NEEDED  FOR  INSOMNIA        Health Maintenance  Topic Date Due  . ZOSTAVAX  01/15/1998  . OPHTHALMOLOGY EXAM  10/22/2014  . INFLUENZA VACCINE  02/06/2015  . FOOT EXAM  06/07/2015  . HEMOGLOBIN A1C  08/05/2015  . URINE MICROALBUMIN  02/06/2016  . TETANUS/TDAP  08/11/2021  . PNA vac Low Risk Adult  Completed    Physical Exam: Filed Vitals:   04/17/15 1135  BP: 120/80  Pulse: 55  Temp: 97.8 F (36.6 C)  TempSrc: Oral  Resp: 20  Height: 5\' 7"  (1.702 m)  Weight: 197 lb 6.4 oz (89.54 kg)  SpO2: 90%   Body mass index is 30.91 kg/(m^2). Physical Exam  Constitutional: He appears well-developed and well-nourished. No distress.  Cardiovascular: Normal rate, regular rhythm, normal heart sounds and intact distal pulses.   Pulmonary/Chest: Effort normal. He has wheezes.  rhonchi  Abdominal: Bowel sounds are normal.  Musculoskeletal: Normal range of motion.  Neurological: He is alert.  Skin: Skin is warm and dry.  Psychiatric: He has a normal mood and affect.    [name] reviewed: Basic Metabolic Panel:  Recent [name]  05/31/14 0815 09/28/14 0833 02/02/15 0825  NA 146* 143 142  K 4.5 4.2 4.3  CL 106 106 105  CO2 27 24 22   GLUCOSE 151* 147* 130*  BUN 25 25 27   CREATININE 1.45* 1.42* 1.71*  CALCIUM 9.8 10.0 9.9   Liver Function Tests:  Recent [name]  09/28/14 0833  AST 14  ALT 12  ALKPHOS 63  BILITOT 0.4  PROT 6.5  ALBUMIN 3.8   No results for input(s): LIPASE, AMYLASE in  the last 8760 hours. No results for input(s): AMMONIA in the last 8760 hours. CBC:  Recent [name]  09/28/14 0833  WBC 5.8  NEUTROABS 3.2  HGB 13.8  HCT 41.3  MCV 93   Lipid Panel:  Recent [name]  05/31/14 0815  CHOL 169  HDL 40  LDLCALC 105*  TRIG 121  CHOLHDL 4.2   Lab Results  Component Value Date   HGBA1C 6.5* 02/02/2015    Assessment/Plan 1. COPD exacerbation ([place]) - will treat with zpak and nebs -if not getting better, add pred pak - DME Nebulizer machine - ipratropium-albuterol (DUONEB) 0.5-2.5 (3) MG/3ML SOLN; Take 3 mLs by nebulization every 6 (six) hours as needed (wheezing).  Dispense: 360 mL; Refill: 3  2. Acute bronchitis, unspecified organism - as above, take zpak - DME Nebulizer machine - ipratropium-albuterol (DUONEB) 0.5-2.5 (3) MG/3ML SOLN; Take 3 mLs by nebulization every 6 (six) hours as needed (wheezing).  Dispense: 360 mL; Refill: 3  3. Type 2 diabetes mellitus with stage 3 chronic kidney disease, without long-term current use of insulin (HCC) -has been very well controlled --CBGs have not been higher since infection going on  [name]/tests ordered:  None, keep [name] already scheduled Next appt:  Keep as scheduled and return prn if not getting better Magnum Lunde L. Tianah Lonardo, D.O. [facility] [address], [place] [address] Cell Phone (Mon-Fri 8am-5pm):  [phone] On Call:  [phone] & follow prompts after 5pm & weekends Office Phone:  [phone] Office Fax:  [phone]

## 2015-04-17 NOTE — Patient Instructions (Addendum)
Avoid advil.  Use tylenol. Avoid cough medicines with D or DM b/c they increase your blood pressure.  Use the nebulizer machine with duonebs every 6 hours as needed for wheezing ONLY when you are ill.  Otherwise, use your albuterol inhaler as directed.  You may use coricidin bp cough syrup for your congestion.

## 2015-05-08 ENCOUNTER — Other Ambulatory Visit: Payer: Commercial Managed Care - HMO

## 2015-05-08 DIAGNOSIS — E785 Hyperlipidemia, unspecified: Secondary | ICD-10-CM

## 2015-05-08 DIAGNOSIS — N1831 Chronic kidney disease, stage 3a: Secondary | ICD-10-CM

## 2015-05-08 DIAGNOSIS — E1169 Type 2 diabetes mellitus with other specified complication: Secondary | ICD-10-CM

## 2015-05-08 DIAGNOSIS — I10 Essential (primary) hypertension: Secondary | ICD-10-CM

## 2015-05-08 DIAGNOSIS — E1122 Type 2 diabetes mellitus with diabetic chronic kidney disease: Secondary | ICD-10-CM

## 2015-05-08 DIAGNOSIS — N183 Chronic kidney disease, stage 3 (moderate): Secondary | ICD-10-CM

## 2015-05-09 ENCOUNTER — Other Ambulatory Visit: Payer: Commercial Managed Care - HMO

## 2015-05-09 LAB — BASIC METABOLIC PANEL
BUN/Creatinine Ratio: 17 (ref 10–22)
BUN: 25 mg/dL (ref 8–27)
CO2: 24 mmol/L (ref 18–29)
Calcium: 10.2 mg/dL (ref 8.6–10.2)
Chloride: 103 mmol/L (ref 97–106)
Creatinine, Ser: 1.51 mg/dL — ABNORMAL HIGH (ref 0.76–1.27)
GFR calc Af Amer: 51 mL/min/{1.73_m2} — ABNORMAL LOW (ref >59)
GFR calc non Af Amer: 44 mL/min/{1.73_m2} — ABNORMAL LOW (ref >59)
Glucose: 147 mg/dL — ABNORMAL HIGH (ref 65–99)
Potassium: 4.4 mmol/L (ref 3.5–5.2)
Sodium: 143 mmol/L (ref 136–144)

## 2015-05-09 LAB — LIPID PANEL
Chol/HDL Ratio: 4.3 ratio units (ref 0.0–5.0)
Cholesterol, Total: 187 mg/dL (ref 100–199)
HDL: 43 mg/dL (ref >39)
LDL Calculated: 118 mg/dL — ABNORMAL HIGH (ref 0–99)
Triglycerides: 128 mg/dL (ref 0–149)
VLDL Cholesterol Cal: 26 mg/dL (ref 5–40)

## 2015-05-09 LAB — HEMOGLOBIN A1C
Est. average glucose Bld gHb Est-mCnc: 148 mg/dL
Hgb A1c MFr Bld: 6.8 % — ABNORMAL HIGH (ref 4.8–5.6)

## 2015-05-12 ENCOUNTER — Encounter: Payer: Self-pay | Admitting: Internal Medicine

## 2015-05-12 ENCOUNTER — Ambulatory Visit (INDEPENDENT_AMBULATORY_CARE_PROVIDER_SITE_OTHER): Payer: Commercial Managed Care - HMO | Admitting: Internal Medicine

## 2015-05-12 VITALS — BP 116/70 | HR 52 | Temp 97.9°F | Resp 20 | Ht 67.0 in | Wt 200.0 lb

## 2015-05-12 DIAGNOSIS — E785 Hyperlipidemia, unspecified: Secondary | ICD-10-CM | POA: Diagnosis not present

## 2015-05-12 DIAGNOSIS — G3184 Mild cognitive impairment, so stated: Secondary | ICD-10-CM

## 2015-05-12 DIAGNOSIS — J45909 Unspecified asthma, uncomplicated: Secondary | ICD-10-CM | POA: Diagnosis not present

## 2015-05-12 DIAGNOSIS — Z0000 Encounter for general adult medical examination without abnormal findings: Secondary | ICD-10-CM | POA: Diagnosis not present

## 2015-05-12 DIAGNOSIS — J449 Chronic obstructive pulmonary disease, unspecified: Secondary | ICD-10-CM

## 2015-05-12 DIAGNOSIS — N183 Chronic kidney disease, stage 3 unspecified: Secondary | ICD-10-CM

## 2015-05-12 DIAGNOSIS — E1169 Type 2 diabetes mellitus with other specified complication: Secondary | ICD-10-CM | POA: Diagnosis not present

## 2015-05-12 DIAGNOSIS — E1122 Type 2 diabetes mellitus with diabetic chronic kidney disease: Secondary | ICD-10-CM

## 2015-05-12 DIAGNOSIS — J4489 Other specified chronic obstructive pulmonary disease: Secondary | ICD-10-CM

## 2015-05-12 DIAGNOSIS — I10 Essential (primary) hypertension: Secondary | ICD-10-CM

## 2015-05-12 DIAGNOSIS — Z6831 Body mass index (BMI) 31.0-31.9, adult: Secondary | ICD-10-CM

## 2015-05-12 DIAGNOSIS — N1831 Chronic kidney disease, stage 3a: Secondary | ICD-10-CM

## 2015-05-12 NOTE — Patient Instructions (Addendum)
Please bring [place] your living will and hcpoa paperwork.  Also please bring [place] the card or other paper they gave you when you had your flu shot.  If you didn't have it, let us know and we'll give it to you.

## 2015-05-12 NOTE — Progress Notes (Signed)
Patient ID: Ian [name], male   DOB: [birth year], 79 y.o.   MRN: [id number]   Location: [place]  Provider: Rexene Edison. Mariea Clonts, D.O., C.M.D.  Code Status: DNR Goals of Care: Advanced Directive information Does patient have an advance directive?: Yes  Chief Complaint  Patient presents with  . Annual Exam    Annual Exam, [name] printed  . Medical Management of Chronic Issues    Hypertension, DM, COPD    HPI: Patient is a 79 y.o. male seen in the office today for an annual wellness exam and med mgt of his chronic diseases.    Depression screen [facility] 2/9 05/12/2015 06/06/2014 03/28/2014 02/03/2014  Decreased Interest 0 0 0 0  Down, Depressed, Hopeless 0 0 0 0  PHQ - 2 Score 0 0 0 0    Fall Risk  05/12/2015 04/17/2015 03/06/2015 02/06/2015 10/06/2014  Falls in the past year? No No No No No   MMSE - Mini Mental State Exam 05/12/2015  Orientation to time 4  Orientation to Place 5  Registration 3  Attention/ Calculation 5  Recall 2  Language- name 2 objects 2  Language- repeat 1  Language- follow 3 step command 3  Language- read & follow direction 1  Write a sentence 1  Copy design 1  Total score 28     Health Maintenance  Topic Date Due  . ZOSTAVAX  01/15/1998  . OPHTHALMOLOGY EXAM  10/22/2014  . INFLUENZA VACCINE  02/06/2015  . FOOT EXAM  06/07/2015  . HEMOGLOBIN A1C  11/05/2015  . URINE MICROALBUMIN  02/06/2016  . TETANUS/TDAP  08/11/2021  . PNA vac Low Risk Adult  Completed   Urinary incontinence? No difficulty with this. Functional status?  Gets help with meals.  Otherwise independent.  Sometimes confused. Exercise?  Goes to gym and had missed two weeks due to his cold he had.  Now back to normal.  IS has helped to.   Diet?  Tries to follow a diabetic heart healthy diet.   Vision:  Wearing his glasses.  20/40 with both eyes and 20/30 right, 20/200 left.  Due to see Dr. Silvestre Gunner again.   Hearing:  Wears hearing aides. Dentition:  No problems.  Sees dentist twice a  year. Pain:  No pain.  Review of Systems:  Review of Systems  Constitutional: Negative for fever, chills and weight loss.  HENT: Negative for congestion.   Eyes: Negative for blurred vision.       Glasses, retinopathy--had prior shot, due for ophtho in jan  Respiratory: Negative for cough and shortness of breath.        Cough and congestion resolved  Cardiovascular: Negative for chest pain and leg swelling.  Gastrointestinal: Negative for heartburn and abdominal pain.  Genitourinary: Negative for dysuria and urgency.  Musculoskeletal: Negative for myalgias, back pain, joint pain, falls and neck pain.  Skin: Negative for rash.  Neurological: Negative for dizziness, loss of consciousness and weakness.  Endo/Heme/Allergies: Bruises/bleeds easily.  Psychiatric/Behavioral: Positive for memory loss. Negative for depression. The patient is not nervous/anxious and does not have insomnia.     Past Medical History  Diagnosis Date  . Diabetes mellitus   . Hypertension   . COPD (chronic obstructive pulmonary disease) ([place])   . Kidney disease     mild  . Hyperlipidemia   . Hypertrophy of prostate without urinary obstruction and other lower urinary tract symptoms (LUTS)   . Hypercalcemia   . Edema   . Ventral hernia, unspecified, without  mention of obstruction or gangrene   . Anxiety   . Depression   . Allergy   . Unspecified sleep apnea   . Hypertrophy of prostate without urinary obstruction and other lower urinary tract symptoms (LUTS)   . Anemia, unspecified   . Unspecified disorder of kidney and ureter   . Alzheimer's disease   . Chronic maxillary sinusitis     Past Surgical History  Procedure Laterality Date  . Hernia repair    . Eyplosatory lap  2002    Allergies  Allergen Reactions  . Ace Inhibitors Cough      Medication List       This list is accurate as of: 05/12/15  9:21 AM.  Always use your most recent med list.               ACCU-CHEK AVIVA device  Use  to test blood Sugar Dx E11.9     albuterol 108 (90 BASE) MCG/ACT inhaler  Commonly known as:  PROVENTIL HFA;VENTOLIN HFA  Inhale 2 puffs into the lungs every 6 (six) hours as needed for wheezing or shortness of breath.     amLODipine 10 MG tablet  Commonly known as:  NORVASC  TAKE 1 TABLET EVERY DAY     aspirin EC 81 MG tablet  Take 1 tablet (81 mg total) by mouth daily.     atorvastatin 20 MG tablet  Commonly known as:  LIPITOR  TAKE 1 TABLET EVERY DAY     azithromycin 250 MG tablet  Commonly known as:  ZITHROMAX  2 tabs today and then 1 tab daily for 4 days     budesonide-formoterol 80-4.5 MCG/ACT inhaler  Commonly known as:  SYMBICORT  Inhale 2 puffs into the lungs 2 (two) times daily.     glucose blood test strip  Commonly known as:  ACCU-CHEK AVIVA  Use to test blood sugar up to three times daily Dx: E11.9     hydrALAZINE 25 MG tablet  Commonly known as:  APRESOLINE  TAKE 1 TABLET TWICE DAILY     ipratropium-albuterol 0.5-2.5 (3) MG/3ML Soln  Commonly known as:  DUONEB  Take 3 mLs by nebulization every 6 (six) hours as needed (wheezing).     metoprolol 50 MG tablet  Commonly known as:  LOPRESSOR  Take 1 tablet (50 mg total) by mouth 2 (two) times daily.     montelukast 10 MG tablet  Commonly known as:  SINGULAIR  TAKE 1 TABLET AT BEDTIME     PARoxetine 20 MG tablet  Commonly known as:  PAXIL  TAKE 1 TABLET ONE TIME DAILY  FOR  DEPRESSION     TRADJENTA 5 MG Tabs tablet  Generic drug:  linagliptin  TAKE 1 TABLET ONE TIME DAILY TO CONTROL BLOOD SUGAR     traZODone 50 MG tablet  Commonly known as:  DESYREL  TAKE 1 TABLET AT BEDTIME AS NEEDED  FOR  INSOMNIA        Physical Exam: Filed Vitals:   05/12/15 0851  BP: 116/70  Pulse: 52  Temp: 97.9 F (36.6 C)  TempSrc: Oral  Resp: 20  Height: 5\' 7"  (1.702 m)  Weight: 200 lb (90.719 kg)  SpO2: 94%   Body mass index is 31.32 kg/(m^2). Physical Exam  Constitutional: He is oriented to person, place,  and time. He appears well-developed and well-nourished. No distress.  HENT:  Head: Normocephalic and atraumatic.  Right Ear: External ear normal.  Left Ear: External ear normal.  Nose: Nose  normal.  Mouth/Throat: Oropharynx is clear and moist. No oropharyngeal exudate.  Eyes: Conjunctivae and EOM are normal. Pupils are equal, round, and reactive to light.  Neck: Normal range of motion. Neck supple. No JVD present. No thyromegaly present.  Cardiovascular: Regular rhythm, normal heart sounds and intact distal pulses.   bradycardic  Pulmonary/Chest: Effort normal and breath sounds normal. No respiratory distress. He has no wheezes.  Abdominal: Soft. Bowel sounds are normal. He exhibits no distension and no mass. There is no tenderness.  Musculoskeletal: Normal range of motion. He exhibits no edema or tenderness.  Lymphadenopathy:    He has no cervical adenopathy.  Neurological: He is alert and oriented to person, place, and time.  Skin: Skin is warm and dry.  Psychiatric: He has a normal mood and affect.    [name] reviewed: Basic Metabolic Panel:  Recent [name]  09/28/14 0833 02/02/15 0825 05/08/15 0826  NA 143 142 143  K 4.2 4.3 4.4  CL 106 105 103  CO2 24 22 24   GLUCOSE 147* 130* 147*  BUN 25 27 25   CREATININE 1.42* 1.71* 1.51*  CALCIUM 10.0 9.9 10.2   Liver Function Tests:  Recent [name]  09/28/14 0833  AST 14  ALT 12  ALKPHOS 63  BILITOT 0.4  PROT 6.5  ALBUMIN 3.8   No results for input(s): LIPASE, AMYLASE in the last 8760 hours. No results for input(s): AMMONIA in the last 8760 hours. CBC:  Recent [name]  09/28/14 0833  WBC 5.8  NEUTROABS 3.2  HGB 13.8  HCT 41.3  MCV 93   Lipid Panel:  Recent [name]  05/31/14 0815 05/08/15 0826  CHOL 169 187  HDL 40 43  LDLCALC 105* 118*  TRIG 121 128  CHOLHDL 4.2 4.3   Lab Results  Component Value Date   HGBA1C 6.8* 05/08/2015    Procedures: EKG today with sinus brady at 52bpm, no acute ischemia or  infarct  Assessment/Plan Routine gen med exam: Advised pt to f/u his ophtho in Jan when due; needs foot exam next time; to bring copy of advance directive; also [facility]  1. Essential hypertension - EKG 12-Lead showed sinus bradycardia--pt stable and asymptomatic -bp well controlled on current regimen--cannot take ace/arb due to coughing  2. Type 2 diabetes mellitus with stage 3 chronic kidney disease, without long-term current use of insulin (HCC) -well controlled, but hba1c had bumped a little when his gym routine was altered by a respiratory infection--getting back into his routine again -cont current meds  3. Chronic kidney disease (CKD) stage G3a/A2, moderately decreased glomerular filtration rate (GFR) between 45-59 mL/min/1.73 square meter and albuminuria creatinine ratio between 30-299 mg/g -cont to avoid nsaids and drink plenty of fluids  4. COPD with asthma ([place]) -cont albuterol inhaler, symbicort, singulair and use nebs when ill--advised to keep machine for as needed use  5. Hyperlipidemia associated with type 2 diabetes mellitus ([place]) -is on statin therapy already -lipid goal is <70 and his was 118 at this point  6. MCI (mild cognitive impairment) with memory loss -scored 28/30 on mmse but failed clock (looked like a basketball)  7. Body mass index (BMI) of 31.0-31.9 in adult -counseled on continued diet and exercise--gained a few lbs back when he missed the gym for 2 wks due to a cold  [name]/tests ordered:   Orders Placed This Encounter  Procedures  . CBC with Differential/Platelet    Standing Status: Future  Number of Occurrences:      Standing Expiration Date: 11/09/2015  . Basic metabolic panel    Standing Status: Future     Number of Occurrences:      Standing Expiration Date: 11/09/2015    Order Specific Question:  Has the patient fasted?    Answer:  Yes  . Hemoglobin A1c    Standing Status: Future     Number of  Occurrences:      Standing Expiration Date: 11/09/2015  . Lipid panel    Standing Status: Future     Number of Occurrences:      Standing Expiration Date: 11/09/2015    Order Specific Question:  Has the patient fasted?    Answer:  Yes  . EKG 12-Lead    Next appt:  08/15/2015 for med mgt with [name] before  Ian [name] L. Lenoir Facchini, D.O. [facility] [address], [place] [address] Cell Phone (Mon-Fri 8am-5pm):  [phone] On Call:  [phone] & follow prompts after 5pm & weekends Office Phone:  [phone] Office Fax:  [phone]

## 2015-05-12 NOTE — Progress Notes (Signed)
Failed clock test 

## 2015-06-09 ENCOUNTER — Other Ambulatory Visit: Payer: Self-pay | Admitting: Internal Medicine

## 2015-06-16 ENCOUNTER — Ambulatory Visit (INDEPENDENT_AMBULATORY_CARE_PROVIDER_SITE_OTHER): Payer: Commercial Managed Care - HMO | Admitting: Internal Medicine

## 2015-06-16 ENCOUNTER — Encounter: Payer: Self-pay | Admitting: Internal Medicine

## 2015-06-16 VITALS — BP 126/58 | HR 53 | Temp 97.7°F | Resp 20 | Ht 67.0 in | Wt 201.0 lb

## 2015-06-16 DIAGNOSIS — N183 Chronic kidney disease, stage 3 unspecified: Secondary | ICD-10-CM

## 2015-06-16 DIAGNOSIS — J301 Allergic rhinitis due to pollen: Secondary | ICD-10-CM

## 2015-06-16 DIAGNOSIS — H9193 Unspecified hearing loss, bilateral: Secondary | ICD-10-CM

## 2015-06-16 DIAGNOSIS — E1122 Type 2 diabetes mellitus with diabetic chronic kidney disease: Secondary | ICD-10-CM | POA: Diagnosis not present

## 2015-06-16 NOTE — Progress Notes (Signed)
Patient ID: Ian [name], male   DOB: Nov 29, 1935, 79 y.o.   MRN: [id number]    Location:    [facility]   Place of Service:  OFFICE   Chief Complaint  Patient presents with  . Acute Visit    Patient c/o Thinks he has wax buildup in both ears    HPI: 79 yo male seen today for hearing loss in both ears x 3-4 days. He is c/a wax bld up. He has post nasal drip and facial presusre and took benadryl this am. Denies ear pain, sore throat, HA or dizziness. No cough, CP or SOB. No sick contacts. He wears hearing aids and notes hearing worse the last 3-4 dyas  DM - A1c 6.8%   He is a poor historian due to dementia and hearing loss.  Past Medical History  Diagnosis Date  . Diabetes mellitus   . Hypertension   . COPD (chronic obstructive pulmonary disease) ([place])   . Kidney disease     mild  . Hyperlipidemia   . Hypertrophy of prostate without urinary obstruction and other lower urinary tract symptoms (LUTS)   . Hypercalcemia   . Edema   . Ventral hernia, unspecified, without mention of obstruction or gangrene   . Anxiety   . Depression   . Allergy   . Unspecified sleep apnea   . Hypertrophy of prostate without urinary obstruction and other lower urinary tract symptoms (LUTS)   . Anemia, unspecified   . Unspecified disorder of kidney and ureter   . Alzheimer's disease   . Chronic maxillary sinusitis     Past Surgical History  Procedure Laterality Date  . Hernia repair    . Eyplosatory lap  2002    Patient Care Team: Ian Curry, DO as PCP - General (Geriatric Medicine) Ian Iba, MD as Referring Physician (Ophthalmology) Ian Mires, MD as Consulting Physician (Pulmonary Disease)  Social History   Social History  . Marital Status: Married    Spouse Name: N/A  . Number of Children: 2  . Years of Education: N/A   Occupational History  . retired     Scientist, clinical (histocompatibility and immunogenetics)   Social History Main Topics  . Smoking status: Former Smoker -- 30 years    Quit date: 07/08/1988  .  Smokeless tobacco: Never Used  . Alcohol Use: No  . Drug Use: No  . Sexual Activity: Not on file   Other Topics Concern  . Not on file   Social History Narrative     reports that he quit smoking about 26 years ago. He has never used smokeless tobacco. He reports that he does not drink alcohol or use illicit drugs.  Allergies  Allergen Reactions  . Ace Inhibitors Cough    Medications: Patient's Medications  New Prescriptions   No medications on file  Previous Medications   ALBUTEROL (PROVENTIL HFA;VENTOLIN HFA) 108 (90 BASE) MCG/ACT INHALER    Inhale 2 puffs into the lungs every 6 (six) hours as needed for wheezing or shortness of breath.   AMLODIPINE (NORVASC) 10 MG TABLET    TAKE 1 TABLET EVERY DAY   ASPIRIN EC 81 MG TABLET    Take 1 tablet (81 mg total) by mouth daily.   ATORVASTATIN (LIPITOR) 20 MG TABLET    TAKE 1 TABLET EVERY DAY   AZITHROMYCIN (ZITHROMAX) 250 MG TABLET    2 tabs today and then 1 tab daily for 4 days   BLOOD GLUCOSE MONITORING SUPPL (ACCU-CHEK AVIVA) DEVICE  Use to test blood Sugar Dx E11.9   BUDESONIDE-FORMOTEROL (SYMBICORT) 80-4.5 MCG/ACT INHALER    Inhale 2 puffs into the lungs 2 (two) times daily.   GLUCOSE BLOOD (ACCU-CHEK AVIVA) TEST STRIP    Use to test blood sugar up to three times daily Dx: E11.9   HYDRALAZINE (APRESOLINE) 25 MG TABLET    TAKE 1 TABLET TWICE DAILY   IPRATROPIUM-ALBUTEROL (DUONEB) 0.5-2.5 (3) MG/3ML SOLN    Take 3 mLs by nebulization every 6 (six) hours as needed (wheezing).   METOPROLOL (LOPRESSOR) 50 MG TABLET    Take 1 tablet (50 mg total) by mouth 2 (two) times daily.   MONTELUKAST (SINGULAIR) 10 MG TABLET    TAKE 1 TABLET AT BEDTIME   PAROXETINE (PAXIL) 20 MG TABLET    TAKE 1 TABLET ONE TIME DAILY  FOR  DEPRESSION   TRADJENTA 5 MG TABS TABLET    TAKE 1 TABLET ONE TIME DAILY TO CONTROL BLOOD SUGAR   TRAZODONE (DESYREL) 50 MG TABLET    TAKE 1 TABLET AT BEDTIME AS NEEDED  FOR  INSOMNIA  Modified Medications   No medications on  file  Discontinued Medications   No medications on file    Review of Systems  Constitutional: Negative for fever, chills, appetite change and fatigue.  HENT: Positive for hearing loss, postnasal drip, sinus pressure and voice change. Negative for congestion, ear discharge, ear pain, sneezing, sore throat and tinnitus.   Respiratory: Negative for cough, shortness of breath and wheezing.   Cardiovascular: Negative for chest pain.  Gastrointestinal: Negative for abdominal pain.  Psychiatric/Behavioral: Positive for confusion. Negative for sleep disturbance.    Filed Vitals:   06/16/15 1042  BP: 126/58  Pulse: 53  Temp: 97.7 F (36.5 C)  TempSrc: Oral  Resp: 20  Height: 5\' 7"  (1.702 m)  Weight: 201 lb (91.173 kg)  SpO2: 93%   Body mass index is 31.47 kg/(m^2).  Physical Exam  Constitutional: He appears well-developed and well-nourished. No distress.  HENT:  Right Ear: External ear normal.  Left Ear: External ear normal.  Mouth/Throat: Oropharynx is clear and moist. No oropharyngeal exudate.  Right TM retracted with dull appearance and air fluid level. No redness. Left TM intact. No cerumen impaction. No sinus TTP. Boggy tissue texture changes at maxillary. Oropharynx cobblestoning and redness but no exudate.   Neck: Neck supple.  Cardiovascular: Normal rate, regular rhythm, normal heart sounds and intact distal pulses.  Exam reveals no gallop and no friction rub.   No murmur heard. Pulmonary/Chest: Effort normal and breath sounds normal. He has no wheezes. He has no rales. He exhibits no tenderness.  Lymphadenopathy:    He has no cervical adenopathy.     [name] reviewed: Appointment on 05/08/2015  Component Date Value Ref Range Status  . Hgb A1c MFr Bld 05/08/2015 6.8* 4.8 - 5.6 % Final   Comment:          Pre-diabetes: 5.7 - 6.4          Diabetes: >6.4          Glycemic control for adults with diabetes: <7.0   . Est. average glucose Bld gHb Est-m* 05/08/2015 148   Final   . Cholesterol, Total 05/08/2015 187  100 - 199 mg/dL Final  . Triglycerides 05/08/2015 128  0 - 149 mg/dL Final  . HDL 05/08/2015 43  >39 mg/dL Final   Comment: According to ATP-III Guidelines, HDL-C >59 mg/dL is considered a negative risk factor for CHD.   Marland Kitchen  VLDL Cholesterol Cal 05/08/2015 26  5 - 40 mg/dL Final  . LDL Calculated 05/08/2015 118* 0 - 99 mg/dL Final  . Chol/HDL Ratio 05/08/2015 4.3  0.0 - 5.0 ratio units Final   Comment:                                   T. Chol/HDL Ratio                                             Men  Women                               1/2 Avg.Risk  3.4    3.3                                   Avg.Risk  5.0    4.4                                2X Avg.Risk  9.6    7.1                                3X Avg.Risk 23.4   11.0   . Glucose 05/08/2015 147* 65 - 99 mg/dL Final  . BUN 05/08/2015 25  8 - 27 mg/dL Final  . Creatinine, Ser 05/08/2015 1.51* 0.76 - 1.27 mg/dL Final  . GFR calc non Af Amer 05/08/2015 44* >59 mL/min/1.73 Final  . GFR calc Af Amer 05/08/2015 51* >59 mL/min/1.73 Final  . BUN/Creatinine Ratio 05/08/2015 17  10 - 22 Final  . Sodium 05/08/2015 143  136 - 144 mmol/L Final  . Potassium 05/08/2015 4.4  3.5 - 5.2 mmol/L Final  . Chloride 05/08/2015 103  97 - 106 mmol/L Final  . CO2 05/08/2015 24  18 - 29 mmol/L Final  . Calcium 05/08/2015 10.2  8.6 - 10.2 mg/dL Final    No results found.   Assessment/Plan   ICD-9-CM ICD-10-CM   1. Allergic rhinitis due to pollen 477.0 J30.1   2. Hearing loss, bilateral - worse probably due to #1 389.9 H91.93   3. Type 2 diabetes mellitus with stage 3 chronic kidney disease, without long-term current use of insulin (HCC) - controlled 250.40 E11.22    585.3 N18.3     He declined depo-medrol injection today  Take plain OTC claritin 10mg  daily for seasonal allergy  Education handout given on allergic rhinitis  Drink plenty of fluids and rest.   Continue other medications as ordered  Follow up  as scheduled with Dr Lorelei Pont S. Perlie Gold  [facility] [address] [place],  [address] [phone] Cell (Monday-Friday 8 AM - 5 PM) [phone] After 5 PM and follow prompts

## 2015-06-16 NOTE — Patient Instructions (Addendum)
Take plain OTC claritin 10mg  daily for seasonal allergy  Drink plenty of fluids and rest.   Continue other medications as ordered  Follow up as scheduled with Dr Mariea Clonts  Allergic Rhinitis Allergic rhinitis is when the mucous membranes in the nose respond to allergens. Allergens are particles in the air that cause your body to have an allergic reaction. This causes you to release allergic antibodies. Through a chain of events, these eventually cause you to release histamine into the blood stream. Although meant to protect the body, it is this release of histamine that causes your discomfort, such as frequent sneezing, congestion, and an itchy, runny nose.  CAUSES Seasonal allergic rhinitis (hay fever) is caused by pollen allergens that may come from grasses, trees, and weeds. Year-round allergic rhinitis (perennial allergic rhinitis) is caused by allergens such as house dust mites, pet dander, and mold spores. SYMPTOMS  Nasal stuffiness (congestion).  Itchy, runny nose with sneezing and tearing of the eyes. DIAGNOSIS Your health care provider can help you determine the allergen or allergens that trigger your symptoms. If you and your health care provider are unable to determine the allergen, skin or blood testing may be used. Your health care provider will diagnose your condition after taking your health history and performing a physical exam. Your health care provider may assess you for other related conditions, such as asthma, pink eye, or an ear infection. TREATMENT Allergic rhinitis does not have a cure, but it can be controlled by:  Medicines that block allergy symptoms. These may include allergy shots, nasal sprays, and oral antihistamines.  Avoiding the allergen. Hay fever may often be treated with antihistamines in pill or nasal spray forms. Antihistamines block the effects of histamine. There are over-the-counter medicines that may help with nasal congestion and swelling around the  eyes. Check with your health care provider before taking or giving this medicine. If avoiding the allergen or the medicine prescribed do not work, there are many new medicines your health care provider can prescribe. Stronger medicine may be used if initial measures are ineffective. Desensitizing injections can be used if medicine and avoidance does not work. Desensitization is when a patient is given ongoing shots until the body becomes less sensitive to the allergen. Make sure you follow up with your health care provider if problems continue. HOME CARE INSTRUCTIONS It is not possible to completely avoid allergens, but you can reduce your symptoms by taking steps to limit your exposure to them. It helps to know exactly what you are allergic to so that you can avoid your specific triggers. SEEK MEDICAL CARE IF:  You have a fever.  You develop a cough that does not stop easily (persistent).  You have shortness of breath.  You start wheezing.  Symptoms interfere with normal daily activities.   This information is not intended to replace advice given to you by your health care provider. Make sure you discuss any questions you have with your health care provider.   Document Released: 03/19/2001 Document Revised: 07/15/2014 Document Reviewed: 03/01/2013 Elsevier Interactive Patient Education [employer].

## 2015-07-03 ENCOUNTER — Other Ambulatory Visit: Payer: Self-pay | Admitting: Internal Medicine

## 2015-07-11 ENCOUNTER — Ambulatory Visit (INDEPENDENT_AMBULATORY_CARE_PROVIDER_SITE_OTHER): Payer: PPO | Admitting: Internal Medicine

## 2015-07-11 ENCOUNTER — Encounter: Payer: Self-pay | Admitting: Internal Medicine

## 2015-07-11 VITALS — BP 140/82 | HR 63 | Temp 98.0°F | Resp 20 | Ht 67.0 in | Wt 200.0 lb

## 2015-07-11 DIAGNOSIS — J069 Acute upper respiratory infection, unspecified: Secondary | ICD-10-CM

## 2015-07-11 MED ORDER — DOXYCYCLINE HYCLATE 50 MG PO CAPS: 50.0000 mg | ORAL_CAPSULE | Freq: Two times a day (BID) | ORAL | Status: DC

## 2015-07-11 NOTE — Progress Notes (Signed)
Patient ID: Ian [name], male   DOB: 07/24/1935, 80 y.o.   MRN: [id number]    Facility  [facility]    Place of Service:   OFFICE    Allergies  Allergen Reactions  . Ace Inhibitors Cough    Chief Complaint  Patient presents with  . Acute Visit    sore throat, cioughling x 5 days, OTC meds, mucinex    HPI:  Acute upper respiratory infection -- also respiratory symptoms proximally 5 days ago. Initially was worked throat. Pharyngitis seems to be improving, but he is producing greenish thick phlegm. He is unaware of a fever. No nausea. Slight myalgias. Denies chest pain or discomfort. No palpitations. Reports he had pneumonia last year.    Medications: Patient's Medications  New Prescriptions   No medications on file  Previous Medications   ALBUTEROL (PROVENTIL HFA;VENTOLIN HFA) 108 (90 BASE) MCG/ACT INHALER    Inhale 2 puffs into the lungs every 6 (six) hours as needed for wheezing or shortness of breath.   AMLODIPINE (NORVASC) 10 MG TABLET    TAKE 1 TABLET EVERY DAY   ASPIRIN EC 81 MG TABLET    Take 1 tablet (81 mg total) by mouth daily.   ATORVASTATIN (LIPITOR) 20 MG TABLET    TAKE 1 TABLET EVERY DAY   BLOOD GLUCOSE MONITORING SUPPL (ACCU-CHEK AVIVA) DEVICE    Use to test blood Sugar Dx E11.9   BUDESONIDE-FORMOTEROL (SYMBICORT) 80-4.5 MCG/ACT INHALER    Inhale 2 puffs into the lungs 2 (two) times daily.   GLUCOSE BLOOD (ACCU-CHEK AVIVA) TEST STRIP    Use to test blood sugar up to three times daily Dx: E11.9   HYDRALAZINE (APRESOLINE) 25 MG TABLET    TAKE 1 TABLET TWICE DAILY   IPRATROPIUM-ALBUTEROL (DUONEB) 0.5-2.5 (3) MG/3ML SOLN    Take 3 mLs by nebulization every 6 (six) hours as needed (wheezing).   METOPROLOL (LOPRESSOR) 50 MG TABLET    Take 1 tablet (50 mg total) by mouth 2 (two) times daily.   MONTELUKAST (SINGULAIR) 10 MG TABLET    TAKE 1 TABLET AT BEDTIME   PAROXETINE (PAXIL) 20 MG TABLET    TAKE 1 TABLET ONE TIME DAILY  FOR  DEPRESSION   TRADJENTA 5 MG TABS TABLET    TAKE 1  TABLET ONE TIME DAILY TO CONTROL BLOOD SUGAR  Modified Medications   No medications on file  Discontinued Medications   AZITHROMYCIN (ZITHROMAX) 250 MG TABLET    2 tabs today and then 1 tab daily for 4 days   TRAZODONE (DESYREL) 50 MG TABLET    TAKE 1 TABLET AT BEDTIME AS NEEDED  FOR  INSOMNIA    Review of Systems  Constitutional: Negative for fever and chills.  HENT: Positive for congestion, ear pain, sinus pressure and sore throat. Voice change: slightly hoarse.   Eyes: Negative for discharge and itching.       Glasses, retinopathy--had prior shot, due for ophtho in jan  Respiratory: Positive for cough. Negative for shortness of breath, wheezing and stridor.   Cardiovascular: Negative for chest pain and leg swelling.  Gastrointestinal: Negative for nausea and abdominal pain.  Genitourinary: Negative for dysuria and urgency.  Musculoskeletal: Negative for myalgias, back pain and neck pain.  Skin: Negative for rash.  Neurological: Negative for dizziness and weakness.  Hematological: Bruises/bleeds easily.  Psychiatric/Behavioral: The patient is not nervous/anxious.     Filed Vitals:   07/11/15 1603  BP: 140/82  Pulse: 63  Temp: 98 F (36.7 C)  TempSrc: Oral  Resp: 20  Height: 5' 7"  (1.702 m)  Weight: 200 lb (90.719 kg)  SpO2: 90%   Body mass index is 31.32 kg/(m^2). Filed Weights   07/11/15 1603  Weight: 200 lb (90.719 kg)     Physical Exam  Constitutional: He is oriented to person, place, and time. He appears well-developed and well-nourished. No distress.  HENT:  Head: Normocephalic and atraumatic.  Right Ear: External ear normal.  Left Ear: External ear normal.  Nose: Nose normal.  Mouth/Throat: Oropharynx is clear and moist. No oropharyngeal exudate.  Eyes: Conjunctivae and EOM are normal. Pupils are equal, round, and reactive to light.  Neck: Normal range of motion. Neck supple. No JVD present. No thyromegaly present.  Cardiovascular: Regular rhythm, normal  heart sounds and intact distal pulses.   bradycardic  Pulmonary/Chest: Effort normal and breath sounds normal. No respiratory distress. He has no wheezes.  Abdominal: Soft. Bowel sounds are normal. He exhibits no distension and no mass. There is no tenderness.  Musculoskeletal: Normal range of motion. He exhibits no edema or tenderness.  Lymphadenopathy:    He has no cervical adenopathy.  Neurological: He is alert and oriented to person, place, and time.  Skin: Skin is warm and dry.  Psychiatric: He has a normal mood and affect.    [name] reviewed: Lab Summary Latest Ref Rng 05/08/2015 02/02/2015 09/28/2014 05/31/2014  Hemoglobin 12.6 - 17.7 g/dL (None) (None) 13.8 (None)  Hematocrit 37.5 - 51.0 % (None) (None) 41.3 (None)  White count 3.4 - 10.8 x10E3/uL (None) (None) 5.8 (None)  Platelet count - (None) (None) (None) (None)  Sodium 136 - 144 mmol/L 143 142 143 146(H)  Potassium 3.5 - 5.2 mmol/L 4.4 4.3 4.2 4.5  Calcium 8.6 - 10.2 mg/dL 10.2 9.9 10.0 9.8  Phosphorus - (None) (None) (None) (None)  Creatinine 0.76 - 1.27 mg/dL 1.51(H) 1.71(H) 1.42(H) 1.45(H)  AST 0 - 40 IU/L (None) (None) 14 (None)  Alk Phos 39 - 117 IU/L (None) (None) 63 (None)  Bilirubin 0.0 - 1.2 mg/dL (None) (None) 0.4 (None)  Glucose 65 - 99 mg/dL 147(H) 130(H) 147(H) 151(H)  Cholesterol - (None) (None) (None) (None)  HDL cholesterol >39 mg/dL 43 (None) (None) 40  Triglycerides 0 - 149 mg/dL 128 (None) (None) 121  LDL Direct - (None) (None) (None) (None)  LDL Calc 0 - 99 mg/dL 118(H) (None) (None) 105(H)  Total protein - (None) (None) (None) (None)  Albumin 3.5 - 4.8 g/dL (None) (None) 3.8 (None)   No results found for: TSH, T3TOTAL, T4TOTAL, THYROIDAB Lab Results  Component Value Date   BUN 25 05/08/2015   BUN 27 02/02/2015   BUN 25 09/28/2014   Lab Results  Component Value Date   HGBA1C 6.8* 05/08/2015   HGBA1C 6.5* 02/02/2015   HGBA1C 6.6* 09/28/2014    Assessment/Plan  1. Acute upper respiratory  infection Likely viral illness, but because of [name] thick phlegm and history of pneumonia 1 year ago, I have elected to start him on antibiotic Use Delsym as needed for cough. - doxycycline (VIBRAMYCIN) 50 MG capsule; Take 1 capsule (50 mg total) by mouth 2 (two) times daily.  Dispense: 14 capsule; Refill: 1

## 2015-07-11 NOTE — Patient Instructions (Signed)
Delsym for cough.

## 2015-07-13 ENCOUNTER — Encounter: Payer: Self-pay | Admitting: Internal Medicine

## 2015-07-17 ENCOUNTER — Encounter: Payer: Self-pay | Admitting: Internal Medicine

## 2015-07-18 DIAGNOSIS — J449 Chronic obstructive pulmonary disease, unspecified: Secondary | ICD-10-CM | POA: Diagnosis not present

## 2015-07-31 ENCOUNTER — Other Ambulatory Visit: Payer: Self-pay | Admitting: *Deleted

## 2015-07-31 MED ORDER — AMLODIPINE BESYLATE 10 MG PO TABS: 10.0000 mg | ORAL_TABLET | Freq: Every day | ORAL | Status: DC

## 2015-07-31 MED ORDER — PAROXETINE HCL 20 MG PO TABS: ORAL_TABLET | ORAL | Status: DC

## 2015-07-31 MED ORDER — MONTELUKAST SODIUM 10 MG PO TABS: 10.0000 mg | ORAL_TABLET | Freq: Every day | ORAL | Status: DC

## 2015-07-31 MED ORDER — METOPROLOL TARTRATE 50 MG PO TABS: 50.0000 mg | ORAL_TABLET | Freq: Two times a day (BID) | ORAL | Status: DC

## 2015-07-31 MED ORDER — HYDRALAZINE HCL 25 MG PO TABS: 25.0000 mg | ORAL_TABLET | Freq: Two times a day (BID) | ORAL | Status: DC

## 2015-07-31 MED ORDER — ATORVASTATIN CALCIUM 20 MG PO TABS: 20.0000 mg | ORAL_TABLET | Freq: Every day | ORAL | Status: DC

## 2015-07-31 NOTE — Telephone Encounter (Signed)
Patient requested to be faxed to [facility]. Called #[phone] and confirmed member. [facility] # [phone] Member ID: [id number]

## 2015-08-15 ENCOUNTER — Other Ambulatory Visit: Payer: Commercial Managed Care - HMO

## 2015-08-16 ENCOUNTER — Other Ambulatory Visit: Payer: PPO

## 2015-08-16 DIAGNOSIS — E1169 Type 2 diabetes mellitus with other specified complication: Secondary | ICD-10-CM

## 2015-08-16 DIAGNOSIS — E785 Hyperlipidemia, unspecified: Secondary | ICD-10-CM

## 2015-08-16 DIAGNOSIS — N183 Chronic kidney disease, stage 3 (moderate): Principal | ICD-10-CM

## 2015-08-16 DIAGNOSIS — I10 Essential (primary) hypertension: Secondary | ICD-10-CM

## 2015-08-16 DIAGNOSIS — E1122 Type 2 diabetes mellitus with diabetic chronic kidney disease: Secondary | ICD-10-CM | POA: Diagnosis not present

## 2015-08-16 DIAGNOSIS — N1831 Chronic kidney disease, stage 3a: Secondary | ICD-10-CM

## 2015-08-17 ENCOUNTER — Ambulatory Visit: Payer: Commercial Managed Care - HMO | Admitting: Internal Medicine

## 2015-08-17 ENCOUNTER — Encounter: Payer: Self-pay | Admitting: Internal Medicine

## 2015-08-17 DIAGNOSIS — Z0289 Encounter for other administrative examinations: Secondary | ICD-10-CM

## 2015-08-17 LAB — CBC WITH DIFFERENTIAL/PLATELET
Basophils Absolute: 0 10*3/uL (ref 0.0–0.2)
Basos: 1 %
EOS (ABSOLUTE): 0.2 10*3/uL (ref 0.0–0.4)
Eos: 3 %
Hematocrit: 39.9 % (ref 37.5–51.0)
Hemoglobin: 13.4 g/dL (ref 12.6–17.7)
Immature Grans (Abs): 0 10*3/uL (ref 0.0–0.1)
Immature Granulocytes: 1 %
Lymphocytes Absolute: 1.8 10*3/uL (ref 0.7–3.1)
Lymphs: 28 %
MCH: 32 pg (ref 26.6–33.0)
MCHC: 33.6 g/dL (ref 31.5–35.7)
MCV: 95 fL (ref 79–97)
Monocytes Absolute: 0.6 10*3/uL (ref 0.1–0.9)
Monocytes: 9 %
Neutrophils Absolute: 3.8 10*3/uL (ref 1.4–7.0)
Neutrophils: 58 %
Platelets: 161 10*3/uL (ref 150–379)
RBC: 4.19 x10E6/uL (ref 4.14–5.80)
RDW: 14.5 % (ref 12.3–15.4)
WBC: 6.4 10*3/uL (ref 3.4–10.8)

## 2015-08-17 LAB — BASIC METABOLIC PANEL
BUN/Creatinine Ratio: 17 (ref 10–22)
BUN: 27 mg/dL (ref 8–27)
CO2: 25 mmol/L (ref 18–29)
Calcium: 9.7 mg/dL (ref 8.6–10.2)
Chloride: 106 mmol/L (ref 96–106)
Creatinine, Ser: 1.59 mg/dL — ABNORMAL HIGH (ref 0.76–1.27)
GFR calc Af Amer: 48 mL/min/{1.73_m2} — ABNORMAL LOW (ref >59)
GFR calc non Af Amer: 41 mL/min/{1.73_m2} — ABNORMAL LOW (ref >59)
Glucose: 172 mg/dL — ABNORMAL HIGH (ref 65–99)
Potassium: 4.5 mmol/L (ref 3.5–5.2)
Sodium: 144 mmol/L (ref 134–144)

## 2015-08-17 LAB — LIPID PANEL
Chol/HDL Ratio: 4 ratio units (ref 0.0–5.0)
Cholesterol, Total: 162 mg/dL (ref 100–199)
HDL: 41 mg/dL (ref >39)
LDL Calculated: 90 mg/dL (ref 0–99)
Triglycerides: 156 mg/dL — ABNORMAL HIGH (ref 0–149)
VLDL Cholesterol Cal: 31 mg/dL (ref 5–40)

## 2015-08-17 LAB — HEMOGLOBIN A1C
Est. average glucose Bld gHb Est-mCnc: 140 mg/dL
Hgb A1c MFr Bld: 6.5 % — ABNORMAL HIGH (ref 4.8–5.6)

## 2015-08-18 DIAGNOSIS — J449 Chronic obstructive pulmonary disease, unspecified: Secondary | ICD-10-CM | POA: Diagnosis not present

## 2015-08-21 ENCOUNTER — Encounter: Payer: Self-pay | Admitting: Internal Medicine

## 2015-08-21 ENCOUNTER — Ambulatory Visit (INDEPENDENT_AMBULATORY_CARE_PROVIDER_SITE_OTHER): Payer: PPO | Admitting: Internal Medicine

## 2015-08-21 VITALS — BP 136/68 | HR 62 | Temp 97.9°F | Ht 67.0 in | Wt 199.0 lb

## 2015-08-21 DIAGNOSIS — N183 Chronic kidney disease, stage 3 unspecified: Secondary | ICD-10-CM

## 2015-08-21 DIAGNOSIS — J45909 Unspecified asthma, uncomplicated: Secondary | ICD-10-CM | POA: Diagnosis not present

## 2015-08-21 DIAGNOSIS — E785 Hyperlipidemia, unspecified: Secondary | ICD-10-CM | POA: Diagnosis not present

## 2015-08-21 DIAGNOSIS — I10 Essential (primary) hypertension: Secondary | ICD-10-CM

## 2015-08-21 DIAGNOSIS — R413 Other amnesia: Secondary | ICD-10-CM

## 2015-08-21 DIAGNOSIS — E1122 Type 2 diabetes mellitus with diabetic chronic kidney disease: Secondary | ICD-10-CM

## 2015-08-21 DIAGNOSIS — J449 Chronic obstructive pulmonary disease, unspecified: Secondary | ICD-10-CM

## 2015-08-21 DIAGNOSIS — E1169 Type 2 diabetes mellitus with other specified complication: Secondary | ICD-10-CM | POA: Diagnosis not present

## 2015-08-21 DIAGNOSIS — J4489 Other specified chronic obstructive pulmonary disease: Secondary | ICD-10-CM

## 2015-08-21 DIAGNOSIS — N1831 Chronic kidney disease, stage 3a: Secondary | ICD-10-CM

## 2015-08-21 NOTE — Progress Notes (Signed)
Patient ID: Ian [name], male   DOB: 03-11-1936, 80 y.o.   MRN: [id number]   Location: [facility] Provider: Rexene Edison. Mariea Clonts, D.O., C.M.D.  Code Status:DNR Goals of Care: Advanced Directive information Does patient have an advance directive?: Yes, Type of Advance Directive: [place];Living will  Chief Complaint  Patient presents with  . Medical Management of Chronic Issues    medication management, blood sugar, blood pressure, anemia, CKD, COPD. BS this am 125, at lunch 100. Check's BS twice daily    HPI: Patient is a 80 y.o. male seen in the office today for med mgt of chronic diseases.    DMII:  Sugar average has gotten better.  Still going to gym regularly.  Has been watching the sweets, bread and drinks.  Says he does cheat sometimes.  His wife catches him and tells him sometimes.    HTN:  BP at goal.    Anemia:  Resolved.  CKD:  Kidneys are stable with last [name].  Cannot take ace due to cough.  COPD:  Breathing well with singulair and symbicort.  Prn albuterol and duoneb.    Hyperlipidemia:  Continues statin therapy.  Triglycerides slightly elevated from cheating on his diet some.  Review of Systems:  Review of Systems  Constitutional: Negative for fever and chills.  HENT: Positive for hearing loss. Negative for congestion.   Eyes: Negative for blurred vision.  Respiratory: Negative for cough and shortness of breath.   Cardiovascular: Negative for chest pain and leg swelling.  Gastrointestinal: Negative for abdominal pain.  Genitourinary: Negative for dysuria.  Musculoskeletal: Negative for falls.  Skin: Negative for rash.  Neurological: Negative for dizziness.  Psychiatric/Behavioral: Positive for memory loss.    Past Medical History  Diagnosis Date  . Diabetes mellitus   . Hypertension   . COPD (chronic obstructive pulmonary disease) ([place])   . Kidney disease     mild  . Hyperlipidemia   . Hypertrophy of prostate without urinary  obstruction and other lower urinary tract symptoms (LUTS)   . Hypercalcemia   . Edema   . Ventral hernia, unspecified, without mention of obstruction or gangrene   . Anxiety   . Depression   . Allergy   . Unspecified sleep apnea   . Hypertrophy of prostate without urinary obstruction and other lower urinary tract symptoms (LUTS)   . Anemia, unspecified   . Unspecified disorder of kidney and ureter   . Alzheimer's disease   . Chronic maxillary sinusitis     Past Surgical History  Procedure Laterality Date  . Hernia repair    . Eyplosatory lap  2002    Allergies  Allergen Reactions  . Ace Inhibitors Cough      Medication List       This list is accurate as of: 08/21/15  4:08 PM.  Always use your most recent med list.               ACCU-CHEK AVIVA device  Use to test blood Sugar Dx E11.9     albuterol 108 (90 Base) MCG/ACT inhaler  Commonly known as:  PROVENTIL HFA;VENTOLIN HFA  Inhale 2 puffs into the lungs every 6 (six) hours as needed for wheezing or shortness of breath.     amLODipine 10 MG tablet  Commonly known as:  NORVASC  Take 1 tablet (10 mg total) by mouth daily.     aspirin EC 81 MG tablet  Take 1 tablet (81 mg total) by mouth daily.  atorvastatin 20 MG tablet  Commonly known as:  LIPITOR  Take 1 tablet (20 mg total) by mouth daily.     budesonide-formoterol 80-4.5 MCG/ACT inhaler  Commonly known as:  SYMBICORT  Inhale 2 puffs into the lungs 2 (two) times daily.     glucose blood test strip  Commonly known as:  ACCU-CHEK AVIVA  Use to test blood sugar up to three times daily Dx: E11.9     hydrALAZINE 25 MG tablet  Commonly known as:  APRESOLINE  Take 1 tablet (25 mg total) by mouth 2 (two) times daily.     ipratropium-albuterol 0.5-2.5 (3) MG/3ML Soln  Commonly known as:  DUONEB  Take 3 mLs by nebulization every 6 (six) hours as needed (wheezing).     metoprolol 50 MG tablet  Commonly known as:  LOPRESSOR  Take 1 tablet (50 mg total)  by mouth 2 (two) times daily.     montelukast 10 MG tablet  Commonly known as:  SINGULAIR  Take 1 tablet (10 mg total) by mouth at bedtime.     PARoxetine 20 MG tablet  Commonly known as:  PAXIL  Take one tablet by mouth once daily for depression     TRADJENTA 5 MG Tabs tablet  Generic drug:  linagliptin  TAKE 1 TABLET ONE TIME DAILY TO CONTROL BLOOD SUGAR        Health Maintenance  Topic Date Due  . ZOSTAVAX  01/15/1998  . OPHTHALMOLOGY EXAM  10/22/2014  . INFLUENZA VACCINE  02/06/2015  . FOOT EXAM  06/07/2015  . URINE MICROALBUMIN  02/06/2016  . HEMOGLOBIN A1C  02/13/2016  . TETANUS/TDAP  08/11/2021  . PNA vac Low Risk Adult  Completed    Physical Exam: Filed Vitals:   08/21/15 1539  BP: 136/68  Pulse: 62  Temp: 97.9 F (36.6 C)  TempSrc: Oral  Height: 5\' 7"  (1.702 m)  Weight: 199 lb (90.266 kg)  SpO2: 94%   Body mass index is 31.16 kg/(m^2). Physical Exam  Constitutional: He is oriented to person, place, and time. He appears well-developed and well-nourished. No distress.  Cardiovascular: Normal rate, regular rhythm, normal heart sounds and intact distal pulses.   Pulmonary/Chest: Effort normal and breath sounds normal. No respiratory distress.  Musculoskeletal: Normal range of motion.  Neurological: He is alert and oriented to person, place, and time.  Skin: Skin is warm and dry.  Psychiatric: He has a normal mood and affect.    [name] reviewed: Basic Metabolic Panel:  Recent [name]  02/02/15 0825 05/08/15 0826 08/16/15 0928  NA 142 143 144  K 4.3 4.4 4.5  CL 105 103 106  CO2 22 24 25   GLUCOSE 130* 147* 172*  BUN 27 25 27   CREATININE 1.71* 1.51* 1.59*  CALCIUM 9.9 10.2 9.7   Liver Function Tests:  Recent [name]  09/28/14 0833  AST 14  ALT 12  ALKPHOS 63  BILITOT 0.4  PROT 6.5  ALBUMIN 3.8   No results for input(s): LIPASE, AMYLASE in the last 8760 hours. No results for input(s): AMMONIA in the last 8760 hours. CBC:  Recent [name]   09/28/14 0833 08/16/15 0928  WBC 5.8 6.4  NEUTROABS 3.2 3.8  HGB 13.8  --   HCT 41.3 39.9  MCV 93 95  PLT  --  161   Lipid Panel:  Recent [name]  05/08/15 0826 08/16/15 0928  CHOL 187 162  HDL 43 41  LDLCALC 118* 90  TRIG 128 156*  CHOLHDL 4.3 4.0  Lab Results  Component Value Date   HGBA1C 6.5* 08/16/2015    Assessment/Plan 1. Type 2 diabetes mellitus with stage 3 chronic kidney disease, without long-term current use of insulin ([place]) - well controlled with diet, exercise, tradjenta (requested samples, but we had none)--cont same -on asa, statin, hydralazine--can't take ace/arb - Hemoglobin A1c; Future  2. Essential hypertension -bp controlled on current regimen so cont same - Basic metabolic panel; Future  3. COPD with asthma ([place]) -cont singulair, symbicort, prn duonebs and albuterol inhaler (not needed recently)  4. Chronic kidney disease (CKD) stage G3a/A2, moderately decreased glomerular filtration rate (GFR) between 45-59 mL/min/1.73 square meter and albuminuria creatinine ratio between 30-299 mg/g -cont adequate hydration, hydralazine, and monitor  5. Hyperlipidemia associated with type 2 diabetes mellitus ([place]) -controlled with lipitor, but TG a little elevated, explained what causes and he's working on cutting back on starchy foods  6. Memory loss -missed his last appt due to confusion on calendar and also reports he had valentine's day marked down as 2/10 instead of 2/14  [name]/tests ordered:   Orders Placed This Encounter  Procedures  . Hemoglobin A1c    Standing Status: Future     Number of Occurrences:      Standing Expiration Date: 02/18/2016  . Basic metabolic panel    Standing Status: Future     Number of Occurrences:      Standing Expiration Date: 02/18/2016    Next appt:  3 mos med mgt with [name] before (pt left w/o making appts)   Hyland Mollenkopf L. Camyah Pultz, D.O. [facility] [address], [place] [address] Cell Phone (Mon-Fri 8am-5pm):  [phone] On Call:  [phone] & follow prompts after 5pm & weekends Office Phone:  [phone] Office Fax:  [phone]

## 2015-08-28 ENCOUNTER — Ambulatory Visit: Payer: PPO

## 2015-09-05 DIAGNOSIS — N183 Chronic kidney disease, stage 3 (moderate): Secondary | ICD-10-CM | POA: Diagnosis not present

## 2015-09-05 DIAGNOSIS — N2581 Secondary hyperparathyroidism of renal origin: Secondary | ICD-10-CM | POA: Diagnosis not present

## 2015-09-05 DIAGNOSIS — I129 Hypertensive chronic kidney disease with stage 1 through stage 4 chronic kidney disease, or unspecified chronic kidney disease: Secondary | ICD-10-CM | POA: Diagnosis not present

## 2015-09-05 DIAGNOSIS — D631 Anemia in chronic kidney disease: Secondary | ICD-10-CM | POA: Diagnosis not present

## 2015-09-14 ENCOUNTER — Other Ambulatory Visit: Payer: Self-pay | Admitting: Nephrology

## 2015-09-14 DIAGNOSIS — N183 Chronic kidney disease, stage 3 unspecified: Secondary | ICD-10-CM

## 2015-09-15 ENCOUNTER — Telehealth: Payer: Self-pay

## 2015-09-15 DIAGNOSIS — J449 Chronic obstructive pulmonary disease, unspecified: Secondary | ICD-10-CM | POA: Diagnosis not present

## 2015-09-15 NOTE — Telephone Encounter (Signed)
Wife called asking for refill on Trazodone 50mg , they are changing Pharmacy to [facility] in [place]. Told Mrs Poniatowski that the Trazodone was d/c on 07/11/15 visit. They didn't know that, I said the patient said he wasn't taking it. I will have to check with Dr. Mariea Clonts about this.

## 2015-09-15 NOTE — Telephone Encounter (Signed)
Ok to resume trazodone 50mg  po qhs for insomnia if pt is having difficulty sleeping or is more depressed.  If not, there is no reason to refill.

## 2015-09-18 ENCOUNTER — Ambulatory Visit
Admission: RE | Admit: 2015-09-18 | Discharge: 2015-09-18 | Disposition: A | Discharge status: Home / Self Care | Payer: PPO | Source: Ambulatory Visit | Attending: Nephrology | Admitting: Nephrology

## 2015-09-18 DIAGNOSIS — N183 Chronic kidney disease, stage 3 unspecified: Secondary | ICD-10-CM

## 2015-09-18 MED ORDER — TRAZODONE HCL 50 MG PO TABS: ORAL_TABLET | ORAL | Status: DC

## 2015-09-18 NOTE — Telephone Encounter (Signed)
Spoke with wife, he is having trouble sleeping. Wants it faxed to [facility] [phone]. Will have Dr. Nyoka Cowden sign it tomorrow, she was find with this.

## 2015-09-27 ENCOUNTER — Encounter: Payer: Self-pay | Admitting: Internal Medicine

## 2015-09-27 ENCOUNTER — Ambulatory Visit (INDEPENDENT_AMBULATORY_CARE_PROVIDER_SITE_OTHER): Payer: PPO | Admitting: Internal Medicine

## 2015-09-27 VITALS — BP 130/72 | HR 70 | Temp 98.3°F | Resp 14 | Ht 67.0 in | Wt 196.0 lb

## 2015-09-27 DIAGNOSIS — N183 Chronic kidney disease, stage 3 (moderate): Secondary | ICD-10-CM

## 2015-09-27 DIAGNOSIS — R509 Fever, unspecified: Secondary | ICD-10-CM | POA: Diagnosis not present

## 2015-09-27 DIAGNOSIS — J45909 Unspecified asthma, uncomplicated: Secondary | ICD-10-CM | POA: Diagnosis not present

## 2015-09-27 DIAGNOSIS — E1122 Type 2 diabetes mellitus with diabetic chronic kidney disease: Secondary | ICD-10-CM | POA: Diagnosis not present

## 2015-09-27 DIAGNOSIS — J301 Allergic rhinitis due to pollen: Secondary | ICD-10-CM

## 2015-09-27 DIAGNOSIS — J449 Chronic obstructive pulmonary disease, unspecified: Secondary | ICD-10-CM

## 2015-09-27 DIAGNOSIS — J069 Acute upper respiratory infection, unspecified: Secondary | ICD-10-CM | POA: Diagnosis not present

## 2015-09-27 LAB — POCT INFLUENZA A/B
Influenza A, POC: NEGATIVE
Influenza B, POC: NEGATIVE

## 2015-09-27 MED ORDER — METHYLPREDNISOLONE 4 MG PO TBPK: ORAL_TABLET | ORAL | Status: DC

## 2015-09-27 MED ORDER — HYDROCODONE-HOMATROPINE 5-1.5 MG/5ML PO SYRP: 5.0000 mL | ORAL_SOLUTION | Freq: Four times a day (QID) | ORAL | Status: DC | PRN

## 2015-09-27 NOTE — Patient Instructions (Signed)
Push fluids and rest  Take steroid pak as directed. May elevate blood sugars  Use cough medication as ordered.  samples of Tradjenta and aspirin given  Flu test negative  Continue other medications as ordered  Follow up with Dr Mariea Clonts as scheduled

## 2015-09-27 NOTE — Progress Notes (Signed)
Patient ID: Ian [name], male   DOB: 01-05-1936, 80 y.o.   MRN: [id number]    Location:    [facility]   Place of Service:  OFFICE   Chief Complaint  Patient presents with  . Acute Visit    Patients c/o cough fever, and sweating x 4-5 days.     HPI:  80 yo male seen today for cough and fever x 4-5 days. He reports cough, f/c, HA, 1 episode of dizziness, sinus congestion. Denies, CP, SOB, nausea, nasal/ear congestion. Takes claritin prn. Tried OTC cough med with temporary . His wife was admitted to [facility] this AM with SOB but he does not know her dx. No known flu exposure. He had his flu shot in OCt 2016.  DM - A1c 6.5%. He requests samples of Tradjenta   COPD with asthma - stable on nebs and singulair. He uses symbicort also  He is a poor historian due to dementia and hearing loss.   Past Medical History  Diagnosis Date  . Diabetes mellitus   . Hypertension   . COPD (chronic obstructive pulmonary disease) ([place])   . Kidney disease     mild  . Hyperlipidemia   . Hypertrophy of prostate without urinary obstruction and other lower urinary tract symptoms (LUTS)   . Hypercalcemia   . Edema   . Ventral hernia, unspecified, without mention of obstruction or gangrene   . Anxiety   . Depression   . Allergy   . Unspecified sleep apnea   . Hypertrophy of prostate without urinary obstruction and other lower urinary tract symptoms (LUTS)   . Anemia, unspecified   . Unspecified disorder of kidney and ureter   . Alzheimer's disease   . Chronic maxillary sinusitis     Past Surgical History  Procedure Laterality Date  . Hernia repair    . Eyplosatory lap  2002    Patient Care Team: Gayland Curry, DO as PCP - General (Geriatric Medicine) Zebedee Iba, MD as Referring Physician (Ophthalmology) Chesley Mires, MD as Consulting Physician (Pulmonary Disease)  Social History   Social History  . Marital Status: Married    Spouse Name: N/A  . Number of Children: 2  . Years of  Education: N/A   Occupational History  . retired     Scientist, clinical (histocompatibility and immunogenetics)   Social History Main Topics  . Smoking status: Former Smoker -- 30 years    Quit date: 07/08/1988  . Smokeless tobacco: Never Used  . Alcohol Use: No  . Drug Use: No  . Sexual Activity: Not on file   Other Topics Concern  . Not on file   Social History Narrative     reports that he quit smoking about 27 years ago. He has never used smokeless tobacco. He reports that he does not drink alcohol or use illicit drugs.  Allergies  Allergen Reactions  . Ace Inhibitors Cough    Medications: Patient's Medications  New Prescriptions   No medications on file  Previous Medications   ALBUTEROL (PROVENTIL HFA;VENTOLIN HFA) 108 (90 BASE) MCG/ACT INHALER    Inhale 2 puffs into the lungs every 6 (six) hours as needed for wheezing or shortness of breath.   AMLODIPINE (NORVASC) 10 MG TABLET    Take 1 tablet (10 mg total) by mouth daily.   ASPIRIN EC 81 MG TABLET    Take 1 tablet (81 mg total) by mouth daily.   ATORVASTATIN (LIPITOR) 20 MG TABLET    Take 1 tablet (20  mg total) by mouth daily.   BLOOD GLUCOSE MONITORING SUPPL (ACCU-CHEK AVIVA) DEVICE    Use to test blood Sugar Dx E11.9   BUDESONIDE-FORMOTEROL (SYMBICORT) 80-4.5 MCG/ACT INHALER    Inhale 2 puffs into the lungs 2 (two) times daily.   GLUCOSE BLOOD (ACCU-CHEK AVIVA) TEST STRIP    Use to test blood sugar up to three times daily Dx: E11.9   HYDRALAZINE (APRESOLINE) 25 MG TABLET    Take 1 tablet (25 mg total) by mouth 2 (two) times daily.   IPRATROPIUM-ALBUTEROL (DUONEB) 0.5-2.5 (3) MG/3ML SOLN    Take 3 mLs by nebulization every 6 (six) hours as needed (wheezing).   METOPROLOL (LOPRESSOR) 50 MG TABLET    Take 1 tablet (50 mg total) by mouth 2 (two) times daily.   MONTELUKAST (SINGULAIR) 10 MG TABLET    Take 1 tablet (10 mg total) by mouth at bedtime.   PAROXETINE (PAXIL) 20 MG TABLET    Take one tablet by mouth once daily for depression   TRADJENTA 5 MG TABS TABLET     TAKE 1 TABLET ONE TIME DAILY TO CONTROL BLOOD SUGAR   TRAZODONE (DESYREL) 50 MG TABLET    Take one tablet at bedtime for sleep as needed  Modified Medications   No medications on file  Discontinued Medications   No medications on file    Review of Systems  Unable to perform ROS: Dementia    Filed Vitals:   09/27/15 0940  BP: 130/72  Pulse: 70  Temp: 98.3 F (36.8 C)  TempSrc: Oral  Resp: 14  Height: 5\' 7"  (1.702 m)  Weight: 196 lb (88.905 kg)  SpO2: 90%   Body mass index is 30.69 kg/(m^2).  Physical Exam  Constitutional: He is oriented to person, place, and time. He appears well-developed and well-nourished.  Looks ill in NAD. No conversational dyspnea  HENT:  Mouth/Throat: No oropharyngeal exudate.  Increased external ear canal cerumen but no impaction. No TM redness and intact. No bulging. Nares with grey turbinates, enlarged. No sinus TTP. Oropharynx cobblestoning with redness but exudate.   Eyes: Pupils are equal, round, and reactive to light. No scleral icterus.  Neck: Neck supple.  Cardiovascular: Normal rate, regular rhythm, normal heart sounds and intact distal pulses.  Exam reveals no gallop and no friction rub.   No murmur heard. Pulmonary/Chest: Effort normal and breath sounds normal. No respiratory distress. He has no wheezes. He has no rales. He exhibits no tenderness.  Musculoskeletal: He exhibits edema.  Lymphadenopathy:    He has no cervical adenopathy.  Neurological: He is alert and oriented to person, place, and time.  Skin: Skin is warm and dry. No rash noted.  Psychiatric: He has a normal mood and affect. His behavior is normal. Judgment and thought content normal.     [name] reviewed: Appointment on 08/16/2015  Component Date Value Ref Range Status  . WBC 08/16/2015 6.4  3.4 - 10.8 x10E3/uL Final  . RBC 08/16/2015 4.19  4.14 - 5.80 x10E6/uL Final  . Hemoglobin 08/16/2015 13.4  12.6 - 17.7 g/dL Final  . Hematocrit 08/16/2015 39.9  37.5 - 51.0 %  Final  . MCV 08/16/2015 95  79 - 97 fL Final  . MCH 08/16/2015 32.0  26.6 - 33.0 pg Final  . MCHC 08/16/2015 33.6  31.5 - 35.7 g/dL Final  . RDW 08/16/2015 14.5  12.3 - 15.4 % Final  . Platelets 08/16/2015 161  150 - 379 x10E3/uL Final  . Neutrophils 08/16/2015 58  Final  . Lymphs 08/16/2015 28   Final  . Monocytes 08/16/2015 9   Final  . Eos 08/16/2015 3   Final  . Basos 08/16/2015 1   Final  . Neutrophils Absolute 08/16/2015 3.8  1.4 - 7.0 x10E3/uL Final  . Lymphocytes Absolute 08/16/2015 1.8  0.7 - 3.1 x10E3/uL Final  . Monocytes Absolute 08/16/2015 0.6  0.1 - 0.9 x10E3/uL Final  . EOS (ABSOLUTE) 08/16/2015 0.2  0.0 - 0.4 x10E3/uL Final  . Basophils Absolute 08/16/2015 0.0  0.0 - 0.2 x10E3/uL Final  . Immature Granulocytes 08/16/2015 1   Final  . Immature Grans (Abs) 08/16/2015 0.0  0.0 - 0.1 x10E3/uL Final  . Glucose 08/16/2015 172* 65 - 99 mg/dL Final  . BUN 08/16/2015 27  8 - 27 mg/dL Final  . Creatinine, Ser 08/16/2015 1.59* 0.76 - 1.27 mg/dL Final  . GFR calc non Af Amer 08/16/2015 41* >59 mL/min/1.73 Final  . GFR calc Af Amer 08/16/2015 48* >59 mL/min/1.73 Final  . BUN/Creatinine Ratio 08/16/2015 17  10 - 22 Final  . Sodium 08/16/2015 144  134 - 144 mmol/L Final  . Potassium 08/16/2015 4.5  3.5 - 5.2 mmol/L Final  . Chloride 08/16/2015 106  96 - 106 mmol/L Final  . CO2 08/16/2015 25  18 - 29 mmol/L Final  . Calcium 08/16/2015 9.7  8.6 - 10.2 mg/dL Final  . Hgb A1c MFr Bld 08/16/2015 6.5* 4.8 - 5.6 % Final   Comment:          Pre-diabetes: 5.7 - 6.4          Diabetes: >6.4          Glycemic control for adults with diabetes: <7.0   . Est. average glucose Bld gHb Est-m* 08/16/2015 140   Final  . Cholesterol, Total 08/16/2015 162  100 - 199 mg/dL Final  . Triglycerides 08/16/2015 156* 0 - 149 mg/dL Final  . HDL 08/16/2015 41  >39 mg/dL Final  . VLDL Cholesterol Cal 08/16/2015 31  5 - 40 mg/dL Final  . LDL Calculated 08/16/2015 90  0 - 99 mg/dL Final  . Chol/HDL Ratio  08/16/2015 4.0  0.0 - 5.0 ratio units Final   Comment:                                   T. Chol/HDL Ratio                                             Men  Women                               1/2 Avg.Risk  3.4    3.3                                   Avg.Risk  5.0    4.4                                2X Avg.Risk  9.6    7.1  3X Avg.Risk 23.4   11.0     US Renal  09/18/2015  CLINICAL DATA:  Chronic stage 3 kidney disease. EXAM: RENAL / URINARY TRACT ULTRASOUND COMPLETE COMPARISON:  Renal ultrasound - 12/30/2012; 10/13/2009 FINDINGS: Right Kidney: Normal cortical thickness, echogenicity and size, measuring 9.3 cm in length, previously 9.8 cm when compared to the 10/2009 examination. Grossly unchanged approximately 1.9 x 1.9 x 2.0 cm partially exophytic anechoic cyst arising from the superior pole of the right kidney, grossly unchanged compared to the 10/2009 examination. No echogenic renal stones. No urinary obstruction. Left Kidney: Normal cortical thickness, echogenicity and size, measuring 10.6 cm in length, unchanged compared to the 10/2009 examination, previously, 10.6 cm. Within the superior/mid aspect of the left kidney is an approximately 0.9 x 0.7 x 0.8 cm anechoic lesion which is too small to accurately characterize though favored to represent a renal cyst. The previously described approximately 2.5 cm anechoic cyst arising from the mid/ caudal aspect of the left kidney is no longer seen. No echogenic renal stones. No urinary obstruction. Bladder: Appears normal for degree of bladder distention. IMPRESSION: Stable and grossly unremarkable appearance of the bilateral kidneys as detailed above. Specifically, no definitive evidence of renal cortical atrophy or urinary obstruction. Electronically Signed   By: Sandi Mariscal M.D.   On: 09/18/2015 11:44     Assessment/Plan   ICD-9-CM ICD-10-CM   1. Acute upper respiratory infection 465.9 J06.9   2. Fever, unspecified  780.60 R50.9 POC Influenza A/B  3. Seasonal allergic rhinitis due to pollen 477.0 J30.1   4. COPD with asthma ([place]) 493.20 J44.9     J45.909   5. Type 2 diabetes mellitus with stage 3 chronic kidney disease, without long-term current use of insulin ([facility]) 250.40 E11.22    585.3 N18.3     Rapid nasal swab Flu test negative  Medrol dose pak as directed  Hycodan 59ml q6hrs prn cough  Cont other meds as ordered  Push fluids and rest  Samples of  ASA 81 mg and Tradjenta 5mg  given  F/u with Dr Mariea Clonts as scheduled  [place]anton S. Perlie Gold  [facility] [address] [place], [place] [address] [phone] Cell (Monday-Friday 8 AM - 5 PM) [phone] After 5 PM and follow prompts

## 2015-10-04 ENCOUNTER — Other Ambulatory Visit: Payer: Self-pay

## 2015-10-04 MED ORDER — ALBUTEROL SULFATE HFA 108 (90 BASE) MCG/ACT IN AERS: 2.0000 | INHALATION_SPRAY | Freq: Four times a day (QID) | RESPIRATORY_TRACT | Status: DC | PRN

## 2015-10-04 NOTE — Telephone Encounter (Signed)
Patients wife came in to ask if her husband had an inhaler on his med list and if so could we refill it for him as he is having trouble with his breathing. He did and I sent the refill to his pharmacy.

## 2015-10-05 ENCOUNTER — Encounter: Payer: Self-pay | Admitting: Nurse Practitioner

## 2015-10-05 ENCOUNTER — Ambulatory Visit (INDEPENDENT_AMBULATORY_CARE_PROVIDER_SITE_OTHER): Payer: PPO | Admitting: Nurse Practitioner

## 2015-10-05 ENCOUNTER — Ambulatory Visit
Admission: RE | Admit: 2015-10-05 | Discharge: 2015-10-05 | Disposition: A | Discharge status: Home / Self Care | Payer: PPO | Source: Ambulatory Visit | Attending: Nurse Practitioner | Admitting: Nurse Practitioner

## 2015-10-05 VITALS — BP 108/64 | HR 89 | Temp 97.7°F | Ht 67.0 in | Wt 185.6 lb

## 2015-10-05 DIAGNOSIS — F329 Major depressive disorder, single episode, unspecified: Secondary | ICD-10-CM

## 2015-10-05 DIAGNOSIS — J069 Acute upper respiratory infection, unspecified: Secondary | ICD-10-CM

## 2015-10-05 DIAGNOSIS — R05 Cough: Secondary | ICD-10-CM | POA: Diagnosis not present

## 2015-10-05 DIAGNOSIS — F32A Depression, unspecified: Secondary | ICD-10-CM

## 2015-10-05 MED ORDER — LEVOFLOXACIN 500 MG PO TABS: 500.0000 mg | ORAL_TABLET | Freq: Every day | ORAL | Status: AC

## 2015-10-05 MED ORDER — METHYLPREDNISOLONE 4 MG PO TBPK: ORAL_TABLET | ORAL | Status: DC

## 2015-10-05 NOTE — Assessment & Plan Note (Addendum)
Mood is stable, continue Paxil 20mg  daily.

## 2015-10-05 NOTE — Assessment & Plan Note (Addendum)
Has been treated with Prednisone taper dose, Hydrocodone, Doxycycline(2 doses left), no better with cough, congestive, sputum production, and wheezes, afebrile, denied chest pain, nausea, vomiting, or diarrhea. Will obtain CXR, CBC, CMP, will treat with Levaquin 500mg  qd x 2 weeks, Medrol dose pack.  Continue Albuterol 2 puffs q6h prn, Symbicort 2 puffs bid, Duoneb q6 prn, Singulair 10mg  daily

## 2015-10-05 NOTE — Assessment & Plan Note (Signed)
Blood sugar is managed with Tradjenta 5mg  daily. Last Hgb A1c 6.5 08/16/15

## 2015-10-05 NOTE — Assessment & Plan Note (Signed)
Memory lapses, staying home under the care of his partner.

## 2015-10-05 NOTE — Progress Notes (Signed)
PSPatient ID: Ian [name], male   DOB: 1935-10-25, 80 y.o.   MRN: [id number]   Location:   [facility]   Place of Service:   [facility] Provider: Marlana Latus NP  Code Status: DNR Goals of Care:  Advanced Directives 10/05/2015  Does patient have an advance directive? Yes  Type of Paramedic of [place];Living will  Copy of advanced directive(s) in chart? Yes     Chief Complaint  Patient presents with  . URI    patient states he has been having cough since 09/27/2015 states that cough is getting worst, no fever, productive cough     HPI: Patient is a 80 y.o. male seen today for an acute visit for congestive cough, stated has been seen x2, completed prednisone taper dose, Hydrocodone eased off some symptoms, has 2 more dose of Doxycycline left, but not much better, still produce yellowish phlegm, denied chest pain, palpitation, nausea, vomiting, diarrhea.    Hx of dementia, lives at home under the care of his partner, blood pressure is controlled on Amlodipine 10mg , Hydralazine 25mg  bid, Metoprolol 50mg  bid. His mood is stable, taking Paxil 20mg  daily. Blood sugar is controlled on Tradjenta 5mg  daily last Hgb A1c 6.5 08/16/15   09/18/15 US renal, y unremarkable appearance of the bilateral kidneys as detailed above. Specifically, no definitive evidence of renal cortical atrophy or urinary obstruction.  Past Medical History  Diagnosis Date  . Diabetes mellitus   . Hypertension   . COPD (chronic obstructive pulmonary disease) ([place])   . Kidney disease     mild  . Hyperlipidemia   . Hypertrophy of prostate without urinary obstruction and other lower urinary tract symptoms (LUTS)   . Hypercalcemia   . Edema   . Ventral hernia, unspecified, without mention of obstruction or gangrene   . Anxiety   . Depression   . Allergy   . Unspecified sleep apnea   . Hypertrophy of prostate without urinary obstruction and other lower urinary tract symptoms (LUTS)   . Anemia, unspecified    . Unspecified disorder of kidney and ureter   . Alzheimer's disease   . Chronic maxillary sinusitis     Past Surgical History  Procedure Laterality Date  . Hernia repair    . Eyplosatory lap  2002    Allergies  Allergen Reactions  . Ace Inhibitors Cough      Medication List       This list is accurate as of: 10/05/15 12:01 PM.  Always use your most recent med list.               ACCU-CHEK AVIVA device  Use to test blood Sugar Dx E11.9     albuterol 108 (90 Base) MCG/ACT inhaler  Commonly known as:  PROVENTIL HFA;VENTOLIN HFA  Inhale 2 puffs into the lungs every 6 (six) hours as needed for wheezing or shortness of breath.     amLODipine 10 MG tablet  Commonly known as:  NORVASC  Take 1 tablet (10 mg total) by mouth daily.     aspirin EC 81 MG tablet  Take 1 tablet (81 mg total) by mouth daily.     atorvastatin 20 MG tablet  Commonly known as:  LIPITOR  Take 1 tablet (20 mg total) by mouth daily.     budesonide-formoterol 80-4.5 MCG/ACT inhaler  Commonly known as:  SYMBICORT  Inhale 2 puffs into the lungs 2 (two) times daily.     chlorpheniramine-HYDROcodone 10-8 MG/5ML Suer  Commonly known  as:  TUSSIONEX  Take 5 mLs by mouth every 4 (four) hours as needed for cough.     glucose blood test strip  Commonly known as:  ACCU-CHEK AVIVA  Use to test blood sugar up to three times daily Dx: E11.9     hydrALAZINE 25 MG tablet  Commonly known as:  APRESOLINE  Take 1 tablet (25 mg total) by mouth 2 (two) times daily.     HYDROcodone-homatropine 5-1.5 MG/5ML syrup  Commonly known as:  HYCODAN  Take 5 mLs by mouth every 6 (six) hours as needed for cough.     ipratropium-albuterol 0.5-2.5 (3) MG/3ML Soln  Commonly known as:  DUONEB  Take 3 mLs by nebulization every 6 (six) hours as needed (wheezing).     levofloxacin 500 MG tablet  Commonly known as:  LEVAQUIN  Take 1 tablet (500 mg total) by mouth daily.     methylPREDNISolone 4 MG Tbpk tablet  Commonly  known as:  MEDROL DOSEPAK  Take as Directed     metoprolol 50 MG tablet  Commonly known as:  LOPRESSOR  Take 1 tablet (50 mg total) by mouth 2 (two) times daily.     montelukast 10 MG tablet  Commonly known as:  SINGULAIR  Take 1 tablet (10 mg total) by mouth at bedtime.     PARoxetine 20 MG tablet  Commonly known as:  PAXIL  Take one tablet by mouth once daily for depression     TRADJENTA 5 MG Tabs tablet  Generic drug:  linagliptin  TAKE 1 TABLET ONE TIME DAILY TO CONTROL BLOOD SUGAR     traZODone 50 MG tablet  Commonly known as:  DESYREL  Take one tablet at bedtime for sleep as needed        Review of Systems:  Review of Systems  Unable to perform ROS: Dementia  Constitutional: Positive for fatigue. Negative for fever, chills, diaphoresis, activity change, appetite change and unexpected weight change.  HENT: Positive for congestion and hearing loss. Negative for ear discharge, ear pain, facial swelling, mouth sores, nosebleeds, rhinorrhea, sinus pressure, sneezing, sore throat, trouble swallowing and voice change.   Eyes: Negative for photophobia, pain, discharge, redness, itching and visual disturbance.  Respiratory: Positive for cough, shortness of breath and wheezing. Negative for apnea, choking, chest tightness and stridor.   Cardiovascular: Negative for chest pain, palpitations and leg swelling.  Gastrointestinal: Negative for nausea, vomiting, diarrhea, blood in stool, abdominal distention, anal bleeding and rectal pain.  Endocrine: Negative for cold intolerance, heat intolerance, polydipsia, polyphagia and polyuria.  Genitourinary: Negative for dysuria, urgency, frequency, hematuria, flank pain, decreased urine volume, difficulty urinating and genital sores.  Musculoskeletal: Positive for arthralgias and gait problem. Negative for myalgias, back pain, joint swelling and neck pain.  Skin: Negative for color change, pallor, rash and wound.  Allergic/Immunologic:  Positive for environmental allergies. Negative for food allergies.  Neurological: Negative for dizziness, tremors, seizures, syncope, facial asymmetry, speech difficulty, weakness, light-headedness, numbness and headaches.  Hematological: Negative for adenopathy. Does not bruise/bleed easily.  Psychiatric/Behavioral: Positive for confusion. Negative for suicidal ideas, hallucinations, behavioral problems, sleep disturbance, self-injury, dysphoric mood, decreased concentration and agitation. The patient is not nervous/anxious.     Health Maintenance  Topic Date Due  . ZOSTAVAX  01/15/1998  . OPHTHALMOLOGY EXAM  10/22/2014  . FOOT EXAM  06/07/2015  . INFLUENZA VACCINE  02/06/2016  . URINE MICROALBUMIN  02/06/2016  . HEMOGLOBIN A1C  02/13/2016  . TETANUS/TDAP  08/11/2021  . PNA vac  Low Risk Adult  Completed    Physical Exam: Filed Vitals:   10/05/15 1048  BP: 108/64  Pulse: 89  Temp: 97.7 F (36.5 C)  TempSrc: Oral  Height: 5\' 7"  (1.702 m)  Weight: 185 lb 9.6 oz (84.188 kg)   Body mass index is 29.06 kg/(m^2). Physical Exam  Constitutional: He is oriented to person, place, and time. He appears well-developed and well-nourished.  Looks ill in NAD. No conversational dyspnea  HENT:  Mouth/Throat: No oropharyngeal exudate.  Increased external ear canal cerumen but no impaction. No TM redness and intact. No bulging. Nares with grey turbinates, enlarged. No sinus TTP. Oropharynx cobblestoning with redness but exudate.   Eyes: Pupils are equal, round, and reactive to light. No scleral icterus.  Neck: Neck supple.  Cardiovascular: Normal rate, regular rhythm, normal heart sounds and intact distal pulses.  Exam reveals no gallop and no friction rub.   No murmur heard. Pulmonary/Chest: Effort normal. No respiratory distress. He has wheezes. He has rales. He exhibits no tenderness.  Musculoskeletal: He exhibits edema.  Trace in BLE  Lymphadenopathy:    He has no cervical adenopathy.    Neurological: He is alert and oriented to person, place, and time.  Skin: Skin is warm and dry. No rash noted.  Psychiatric: He has a normal mood and affect. His behavior is normal. Judgment and thought content normal.    [name] reviewed: Basic Metabolic Panel:  Recent [name]  02/02/15 0825 05/08/15 0826 08/16/15 0928  NA 142 143 144  K 4.3 4.4 4.5  CL 105 103 106  CO2 22 24 25   GLUCOSE 130* 147* 172*  BUN 27 25 27   CREATININE 1.71* 1.51* 1.59*  CALCIUM 9.9 10.2 9.7   Liver Function Tests: No results for input(s): AST, ALT, ALKPHOS, BILITOT, PROT, ALBUMIN in the last 8760 hours. No results for input(s): LIPASE, AMYLASE in the last 8760 hours. No results for input(s): AMMONIA in the last 8760 hours. CBC:  Recent [name]  08/16/15 0928  WBC 6.4  NEUTROABS 3.8  HCT 39.9  MCV 95  PLT 161   Lipid Panel:  Recent [name]  05/08/15 0826 08/16/15 0928  CHOL 187 162  HDL 43 41  LDLCALC 118* 90  TRIG 128 156*  CHOLHDL 4.3 4.0   Lab Results  Component Value Date   HGBA1C 6.5* 08/16/2015    Procedures since last visit: US Renal  09/18/2015  CLINICAL DATA:  Chronic stage 3 kidney disease. EXAM: RENAL / URINARY TRACT ULTRASOUND COMPLETE COMPARISON:  Renal ultrasound - 12/30/2012; 10/13/2009 FINDINGS: Right Kidney: Normal cortical thickness, echogenicity and size, measuring 9.3 cm in length, previously 9.8 cm when compared to the 10/2009 examination. Grossly unchanged approximately 1.9 x 1.9 x 2.0 cm partially exophytic anechoic cyst arising from the superior pole of the right kidney, grossly unchanged compared to the 10/2009 examination. No echogenic renal stones. No urinary obstruction. Left Kidney: Normal cortical thickness, echogenicity and size, measuring 10.6 cm in length, unchanged compared to the 10/2009 examination, previously, 10.6 cm. Within the superior/mid aspect of the left kidney is an approximately 0.9 x 0.7 x 0.8 cm anechoic lesion which is too small to accurately  characterize though favored to represent a renal cyst. The previously described approximately 2.5 cm anechoic cyst arising from the mid/ caudal aspect of the left kidney is no longer seen. No echogenic renal stones. No urinary obstruction. Bladder: Appears normal for degree of bladder distention. IMPRESSION: Stable and grossly unremarkable appearance of the bilateral kidneys as  detailed above. Specifically, no definitive evidence of renal cortical atrophy or urinary obstruction. Electronically Signed   By: Sandi Mariscal M.D.   On: 09/18/2015 11:44    Assessment/Plan Acute upper respiratory infection Has been treated with Prednisone taper dose, Hydrocodone, Doxycycline(2 doses left), no better with cough, congestive, sputum production, and wheezes, afebrile, denied chest pain, nausea, vomiting, or diarrhea. Will obtain CXR, CBC, CMP, will treat with Levaquin 500mg  qd x 2 weeks, Medrol dose pack.  Continue Albuterol 2 puffs q6h prn, Symbicort 2 puffs bid, Duoneb q6 prn, Singulair 10mg  daily  Alzheimer's disease Memory lapses, staying home under the care of his partner.   Hypertension Controlled, continue Amlodipine 10mg  daily, Hydralazine 25mg  bid, Metoprolol 50mg  bid  Depression Mood is stable, continue Paxil 20mg  daily.   Diabetes mellitus Blood sugar is managed with Tradjenta 5mg  daily. Last Hgb A1c 6.5 08/16/15     [name]/tests ordered:  @ORDERS @ CBC, CMP, CXR  Next appt:  11/20/2015

## 2015-10-05 NOTE — Assessment & Plan Note (Signed)
Controlled, continue Amlodipine 10mg  daily, Hydralazine 25mg  bid, Metoprolol 50mg  bid

## 2015-10-06 ENCOUNTER — Telehealth: Payer: Self-pay | Admitting: *Deleted

## 2015-10-06 DIAGNOSIS — R059 Cough, unspecified: Secondary | ICD-10-CM

## 2015-10-06 DIAGNOSIS — R05 Cough: Secondary | ICD-10-CM

## 2015-10-06 LAB — COMPREHENSIVE METABOLIC PANEL
A/G RATIO: 0.9 — AB (ref 1.2–2.2)
ALK PHOS: 50 IU/L (ref 39–117)
ALT: 22 IU/L (ref 0–44)
AST: 16 IU/L (ref 0–40)
Albumin: 3 g/dL — ABNORMAL LOW (ref 3.5–4.8)
BILIRUBIN TOTAL: 0.5 mg/dL (ref 0.0–1.2)
BUN/Creatinine Ratio: 24 — ABNORMAL HIGH (ref 10–22)
BUN: 42 mg/dL — ABNORMAL HIGH (ref 8–27)
CHLORIDE: 102 mmol/L (ref 96–106)
CO2: 24 mmol/L (ref 18–29)
Calcium: 10 mg/dL (ref 8.6–10.2)
Creatinine, Ser: 1.75 mg/dL — ABNORMAL HIGH (ref 0.76–1.27)
GFR calc Af Amer: 42 mL/min/{1.73_m2} — ABNORMAL LOW (ref >59)
GFR calc non Af Amer: 37 mL/min/{1.73_m2} — ABNORMAL LOW (ref >59)
Globulin, Total: 3.4 g/dL (ref 1.5–4.5)
Glucose: 118 mg/dL — ABNORMAL HIGH (ref 65–99)
POTASSIUM: 4.6 mmol/L (ref 3.5–5.2)
SODIUM: 144 mmol/L (ref 134–144)
Total Protein: 6.4 g/dL (ref 6.0–8.5)

## 2015-10-06 LAB — CBC WITH DIFFERENTIAL/PLATELET
BASOS ABS: 0 10*3/uL (ref 0.0–0.2)
Basos: 0 %
EOS (ABSOLUTE): 0.4 10*3/uL (ref 0.0–0.4)
Eos: 3 %
Hematocrit: 41.7 % (ref 37.5–51.0)
Hemoglobin: 13.7 g/dL (ref 12.6–17.7)
LYMPHS ABS: 2 10*3/uL (ref 0.7–3.1)
LYMPHS: 17 %
MCH: 32.1 pg (ref 26.6–33.0)
MCHC: 32.9 g/dL (ref 31.5–35.7)
MCV: 98 fL — ABNORMAL HIGH (ref 79–97)
MONOS ABS: 1.4 10*3/uL — AB (ref 0.1–0.9)
Monocytes: 12 %
NEUTROS PCT: 63 %
Neutrophils Absolute: 7.6 10*3/uL — ABNORMAL HIGH (ref 1.4–7.0)
PLATELETS: 243 10*3/uL (ref 150–379)
RBC: 4.27 x10E6/uL (ref 4.14–5.80)
RDW: 14.9 % (ref 12.3–15.4)
WBC: 12 10*3/uL — ABNORMAL HIGH (ref 3.4–10.8)

## 2015-10-06 LAB — IMMATURE CELLS
METAMYELOCYTES: 4 % — AB (ref 0–0)
MYELOCYTES: 1 % — ABNORMAL HIGH (ref 0–0)

## 2015-10-06 NOTE — Telephone Encounter (Signed)
Patient is calling wanting the results of his Chest X-Ray he had done yesterday. Please Advise.

## 2015-10-06 NOTE — Telephone Encounter (Signed)
Had Dr. Mariea Clonts look at patient Chest X-Ray results--per Dr. Mariea Clonts: Repeat Chest Xray in 4 weeks to see if it clears.  Patient wife notified to complete antibiotic and follow up in 4 week with chest X-Ray and keep 5/18 appointment with Dr. Mariea Clonts. Chest X-Ray order placed. Wife agreed.

## 2015-10-09 NOTE — Telephone Encounter (Signed)
This encounter was created in error - please disregard.

## 2015-10-10 ENCOUNTER — Telehealth: Payer: Self-pay

## 2015-10-10 NOTE — Telephone Encounter (Signed)
Patients wife called to ask what she should do about the med pack that was prescribed for her husband.  He started to take the medication pack backwards she said she has since  tried to figure out the doses but is now confused. She says he has 4 pills left which equals 16 mg  Please Advise

## 2015-10-11 NOTE — Telephone Encounter (Signed)
Let's have him just take one pill per day until he is finished.  She will need to supervise his meds for the future due to his memory problems

## 2015-10-11 NOTE — Telephone Encounter (Signed)
Called spoke with wife of patient told her what doctor Mariea Clonts had said in her notes for her to do. She had no questions.

## 2015-10-16 DIAGNOSIS — J449 Chronic obstructive pulmonary disease, unspecified: Secondary | ICD-10-CM | POA: Diagnosis not present

## 2015-10-23 ENCOUNTER — Ambulatory Visit (INDEPENDENT_AMBULATORY_CARE_PROVIDER_SITE_OTHER): Payer: PPO | Admitting: Internal Medicine

## 2015-10-23 ENCOUNTER — Encounter: Payer: Self-pay | Admitting: Internal Medicine

## 2015-10-23 VITALS — BP 118/60 | HR 62 | Temp 98.0°F | Wt 190.0 lb

## 2015-10-23 DIAGNOSIS — R634 Abnormal weight loss: Secondary | ICD-10-CM

## 2015-10-23 DIAGNOSIS — J069 Acute upper respiratory infection, unspecified: Secondary | ICD-10-CM | POA: Diagnosis not present

## 2015-10-23 DIAGNOSIS — J449 Chronic obstructive pulmonary disease, unspecified: Secondary | ICD-10-CM

## 2015-10-23 DIAGNOSIS — J45909 Unspecified asthma, uncomplicated: Secondary | ICD-10-CM

## 2015-10-23 DIAGNOSIS — J4489 Other specified chronic obstructive pulmonary disease: Secondary | ICD-10-CM

## 2015-10-23 NOTE — Progress Notes (Signed)
Patient ID: Ian [name], male   DOB: 08-Feb-1936, 80 y.o.   MRN: [id number]   Location:  [facility] clinic Provider: Jola Critzer L. Mariea [name], D.O., C.M.D.  Code Status: DNR Goals of Care:  Advanced Directives 10/23/2015  Does patient have an advance directive? Yes  Type of Paramedic of [place];Living will  Copy of advanced directive(s) in chart? Yes   Chief Complaint  Patient presents with  . Acute Visit    weight loss    HPI: Patient is a 80 y.o. male seen today for an acute visit for weight loss.  He had lost weight from 200 in Jan (1/3) down to 185.6 on 3/30.  Says he got down to 183 lbs.  He had not increased his diet or exercise program, just continued his usual.  He has now gained about 5 lbs back today.  He has just gotten over a respiratory infection.  Some weight loss may have been due to tradjenta for his diabetes.  Head congestion and aching had been terrible.  Levaquin really helped him.  He felt like he was a goner at one point.  His appetite is improving again.  Sometimes forgets to weigh himself.    Says he is nodding off in his chair a lot.  Happening more than it used to.  Has to take breaks from housework.  Energy is gradually coming back.  Is more constipated.    Past Medical History  Diagnosis Date  . Diabetes mellitus   . Hypertension   . COPD (chronic obstructive pulmonary disease) ([place])   . Kidney disease     mild  . Hyperlipidemia   . Hypertrophy of prostate without urinary obstruction and other lower urinary tract symptoms (LUTS)   . Hypercalcemia   . Edema   . Ventral hernia, unspecified, without mention of obstruction or gangrene   . Anxiety   . Depression   . Allergy   . Unspecified sleep apnea   . Hypertrophy of prostate without urinary obstruction and other lower urinary tract symptoms (LUTS)   . Anemia, unspecified   . Unspecified disorder of kidney and ureter   . Alzheimer's disease   . Chronic maxillary sinusitis     Past Surgical  History  Procedure Laterality Date  . Hernia repair    . Eyplosatory lap  2002    Allergies  Allergen Reactions  . Ace Inhibitors Cough      Medication List       This list is accurate as of: 10/23/15  1:13 PM.  Always use your most recent med list.               albuterol 108 (90 Base) MCG/ACT inhaler  Commonly known as:  PROVENTIL HFA;VENTOLIN HFA  Inhale 2 puffs into the lungs every 6 (six) hours as needed for wheezing or shortness of breath.     amLODipine 10 MG tablet  Commonly known as:  NORVASC  Take 1 tablet (10 mg total) by mouth daily.     aspirin EC 81 MG tablet  Take 1 tablet (81 mg total) by mouth daily.     atorvastatin 20 MG tablet  Commonly known as:  LIPITOR  Take 1 tablet (20 mg total) by mouth daily.     glucose blood test strip  Commonly known as:  ACCU-CHEK AVIVA  Use to test blood sugar up to three times daily Dx: E11.9     hydrALAZINE 25 MG tablet  Commonly known as:  APRESOLINE  Take 1 tablet (25 mg total) by mouth 2 (two) times daily.     ipratropium-albuterol 0.5-2.5 (3) MG/3ML Soln  Commonly known as:  DUONEB  Take 3 mLs by nebulization every 6 (six) hours as needed (wheezing).     losartan 25 MG tablet  Commonly known as:  COZAAR     metoprolol 50 MG tablet  Commonly known as:  LOPRESSOR  Take 1 tablet (50 mg total) by mouth 2 (two) times daily.     montelukast 10 MG tablet  Commonly known as:  SINGULAIR  Take 1 tablet (10 mg total) by mouth at bedtime.     PARoxetine 20 MG tablet  Commonly known as:  PAXIL  Take one tablet by mouth once daily for depression     TRADJENTA 5 MG Tabs tablet  Generic drug:  linagliptin  TAKE 1 TABLET ONE TIME DAILY TO CONTROL BLOOD SUGAR     traZODone 50 MG tablet  Commonly known as:  DESYREL  Take one tablet at bedtime for sleep as needed        Review of Systems:  Review of Systems  Constitutional: Positive for malaise/fatigue. Negative for fever, chills and weight loss.  HENT:  Positive for congestion.   Eyes: Negative for blurred vision.       Glasses  Respiratory: Negative for cough and shortness of breath.   Cardiovascular: Negative for chest pain and leg swelling.  Gastrointestinal: Positive for constipation. Negative for abdominal pain, blood in stool and melena.  Musculoskeletal: Negative for falls.  Neurological: Positive for weakness. Negative for loss of consciousness.    Health Maintenance  Topic Date Due  . ZOSTAVAX  01/15/1998  . OPHTHALMOLOGY EXAM  10/22/2014  . FOOT EXAM  06/07/2015  . INFLUENZA VACCINE  02/06/2016  . URINE MICROALBUMIN  02/06/2016  . HEMOGLOBIN A1C  02/13/2016  . TETANUS/TDAP  08/11/2021  . PNA vac Low Risk Adult  Completed    Physical Exam: Filed Vitals:   10/23/15 1307  BP: 118/60  Pulse: 62  Temp: 98 F (36.7 C)  TempSrc: Oral  Weight: 190 lb (86.183 kg)  SpO2: 95%   Body mass index is 29.75 kg/(m^2). Physical Exam  Constitutional: He appears well-developed and well-nourished.  Appears thinner in face and upper chest  HENT:  Head: Normocephalic and atraumatic.  Eyes:  glasses  Cardiovascular: Normal rate, regular rhythm and normal heart sounds.   Pulmonary/Chest: Effort normal and breath sounds normal. No respiratory distress. He has no wheezes. He has no rales.  Musculoskeletal: Normal range of motion.  Neurological: He is alert.  Skin: Skin is warm and dry. There is pallor.  Psychiatric: He has a normal mood and affect.   [name] reviewed: Basic Metabolic Panel:  Recent [name]  05/08/15 0826 08/16/15 0928 10/05/15 1135  NA 143 144 144  K 4.4 4.5 4.6  CL 103 106 102  CO2 24 25 24   GLUCOSE 147* 172* 118*  BUN 25 27 42*  CREATININE 1.51* 1.59* 1.75*  CALCIUM 10.2 9.7 10.0   Liver Function Tests:  Recent [name]  10/05/15 1135  AST 16  ALT 22  ALKPHOS 50  BILITOT 0.5  PROT 6.4  ALBUMIN 3.0*   No results for input(s): LIPASE, AMYLASE in the last 8760 hours. No results for input(s): AMMONIA  in the last 8760 hours. CBC:  Recent [name]  08/16/15 0928 10/05/15 1135  WBC 6.4 12.0*  NEUTROABS 3.8 7.6*  HCT 39.9 41.7  MCV 95 98*  PLT 161  243   Lipid Panel:  Recent [name]  05/08/15 0826 08/16/15 0928  CHOL 187 162  HDL 43 41  LDLCALC 118* 90  TRIG 128 156*  CHOLHDL 4.3 4.0   Lab Results  Component Value Date   HGBA1C 6.5* 08/16/2015    Procedures since last visit: Dg Chest 2 View  10/05/2015  CLINICAL DATA:  Cough for 6 weeks. EXAM: CHEST  2 VIEW COMPARISON:  May 20, 2013. FINDINGS: Stable cardiomediastinal silhouette. No pneumothorax is noted. Right lung is clear. Interval development of diffuse nodular opacities throughout the left upper lobe which may represent pneumonia, but nodules cannot be excluded. Minimal left pleural effusion is noted. Severe narrowing of right subacromial space is noted consistent with rotator cuff injury. IMPRESSION: Interval development of multinodular opacities throughout left upper lobe which may represent multifocal pneumonia, but pulmonary nodules or metastatic disease cannot be excluded. Minimal left pleural effusion is noted. Short-term follow-up radiographs in 3-4 weeks after antibiotic therapy trial is recommended to ensure resolution and rule out underlying malignancy. Electronically Signed   By: Marijo Conception, M.D.   On: 10/05/2015 13:41    Assessment/Plan 1. Acute upper respiratory infection -nearly resolved -some minor residual congestion in his nose (has some degree of chronic sinus congestion)  2. COPD with asthma ([place]) -cont current therapy  3. Loss of weight -some may be due to effects of tradjenta and he's technically overweight actually -however, I suspect it's more related to his back to back URIs since early this year that are finally going away and his appetite is finally coming back  [name]/tests ordered:  Keep 5/15 lab visit Next appt:  Keep 11/23/2015  Nina Hoar L. Motty Borin, D.O. [facility] [address], [place] [address] Cell Phone (Mon-Fri 8am-5pm):  [phone] On Call:  [phone] & follow prompts after 5pm & weekends Office Phone:  [phone] Office Fax:  [phone]

## 2015-10-23 NOTE — Patient Instructions (Signed)
Call back if you lose more weight Otherwise keep your may appts.

## 2015-10-26 ENCOUNTER — Encounter: Payer: Self-pay | Admitting: Internal Medicine

## 2015-10-31 ENCOUNTER — Other Ambulatory Visit: Payer: Self-pay | Admitting: Internal Medicine

## 2015-10-31 ENCOUNTER — Other Ambulatory Visit: Payer: Self-pay | Admitting: *Deleted

## 2015-10-31 ENCOUNTER — Ambulatory Visit
Admission: RE | Admit: 2015-10-31 | Discharge: 2015-10-31 | Disposition: A | Discharge status: Home / Self Care | Payer: PPO | Source: Ambulatory Visit | Attending: Internal Medicine | Admitting: Internal Medicine

## 2015-10-31 DIAGNOSIS — R059 Cough, unspecified: Secondary | ICD-10-CM

## 2015-10-31 DIAGNOSIS — R05 Cough: Secondary | ICD-10-CM | POA: Diagnosis not present

## 2015-11-03 ENCOUNTER — Other Ambulatory Visit: Payer: Self-pay

## 2015-11-03 DIAGNOSIS — R9389 Abnormal findings on diagnostic imaging of other specified body structures: Secondary | ICD-10-CM

## 2015-11-03 DIAGNOSIS — J189 Pneumonia, unspecified organism: Secondary | ICD-10-CM

## 2015-11-06 ENCOUNTER — Telehealth: Payer: Self-pay | Admitting: *Deleted

## 2015-11-06 DIAGNOSIS — R634 Abnormal weight loss: Secondary | ICD-10-CM

## 2015-11-06 DIAGNOSIS — J449 Chronic obstructive pulmonary disease, unspecified: Secondary | ICD-10-CM

## 2015-11-06 DIAGNOSIS — J069 Acute upper respiratory infection, unspecified: Secondary | ICD-10-CM

## 2015-11-06 NOTE — Telephone Encounter (Signed)
Patient called and wanted an order to have his chest x-ray repeated before his appointment with Dr. Mariea Clonts on 11/23/15. Checked chart and patient is to follow up with Chest X-Ray. Order placed.

## 2015-11-09 ENCOUNTER — Encounter: Payer: Self-pay | Admitting: Internal Medicine

## 2015-11-15 DIAGNOSIS — J449 Chronic obstructive pulmonary disease, unspecified: Secondary | ICD-10-CM | POA: Diagnosis not present

## 2015-11-16 ENCOUNTER — Ambulatory Visit
Admission: RE | Admit: 2015-11-16 | Discharge: 2015-11-16 | Disposition: A | Discharge status: Home / Self Care | Payer: PPO | Source: Ambulatory Visit | Attending: Internal Medicine | Admitting: Internal Medicine

## 2015-11-16 DIAGNOSIS — J069 Acute upper respiratory infection, unspecified: Secondary | ICD-10-CM

## 2015-11-16 DIAGNOSIS — R634 Abnormal weight loss: Secondary | ICD-10-CM

## 2015-11-16 DIAGNOSIS — R918 Other nonspecific abnormal finding of lung field: Secondary | ICD-10-CM | POA: Diagnosis not present

## 2015-11-16 DIAGNOSIS — J449 Chronic obstructive pulmonary disease, unspecified: Secondary | ICD-10-CM

## 2015-11-20 ENCOUNTER — Other Ambulatory Visit: Payer: PPO

## 2015-11-20 DIAGNOSIS — I10 Essential (primary) hypertension: Secondary | ICD-10-CM

## 2015-11-20 DIAGNOSIS — N183 Chronic kidney disease, stage 3 (moderate): Principal | ICD-10-CM

## 2015-11-20 DIAGNOSIS — E1122 Type 2 diabetes mellitus with diabetic chronic kidney disease: Secondary | ICD-10-CM

## 2015-11-21 LAB — BASIC METABOLIC PANEL
BUN/Creatinine Ratio: 21 (ref 10–24)
BUN: 32 mg/dL — ABNORMAL HIGH (ref 8–27)
CO2: 20 mmol/L (ref 18–29)
Calcium: 9.9 mg/dL (ref 8.6–10.2)
Chloride: 104 mmol/L (ref 96–106)
Creatinine, Ser: 1.51 mg/dL — ABNORMAL HIGH (ref 0.76–1.27)
GFR calc Af Amer: 51 mL/min/{1.73_m2} — ABNORMAL LOW (ref >59)
GFR calc non Af Amer: 44 mL/min/{1.73_m2} — ABNORMAL LOW (ref >59)
Glucose: 134 mg/dL — ABNORMAL HIGH (ref 65–99)
Potassium: 4.3 mmol/L (ref 3.5–5.2)
Sodium: 143 mmol/L (ref 134–144)

## 2015-11-21 LAB — HEMOGLOBIN A1C
Est. average glucose Bld gHb Est-mCnc: 140 mg/dL
Hgb A1c MFr Bld: 6.5 % — ABNORMAL HIGH (ref 4.8–5.6)

## 2015-11-23 ENCOUNTER — Encounter: Payer: Self-pay | Admitting: *Deleted

## 2015-11-23 ENCOUNTER — Encounter: Payer: Self-pay | Admitting: Internal Medicine

## 2015-11-23 ENCOUNTER — Ambulatory Visit (INDEPENDENT_AMBULATORY_CARE_PROVIDER_SITE_OTHER): Payer: PPO | Admitting: Internal Medicine

## 2015-11-23 VITALS — BP 120/60 | HR 60 | Temp 98.1°F | Ht 67.0 in | Wt 190.0 lb

## 2015-11-23 DIAGNOSIS — F0280 Dementia in other diseases classified elsewhere without behavioral disturbance: Secondary | ICD-10-CM | POA: Diagnosis not present

## 2015-11-23 DIAGNOSIS — E785 Hyperlipidemia, unspecified: Secondary | ICD-10-CM | POA: Diagnosis not present

## 2015-11-23 DIAGNOSIS — N183 Chronic kidney disease, stage 3 (moderate): Secondary | ICD-10-CM | POA: Diagnosis not present

## 2015-11-23 DIAGNOSIS — N1831 Chronic kidney disease, stage 3a: Secondary | ICD-10-CM | POA: Insufficient documentation

## 2015-11-23 DIAGNOSIS — G300 Alzheimer's disease with early onset: Secondary | ICD-10-CM

## 2015-11-23 DIAGNOSIS — R918 Other nonspecific abnormal finding of lung field: Secondary | ICD-10-CM

## 2015-11-23 DIAGNOSIS — R938 Abnormal findings on diagnostic imaging of other specified body structures: Secondary | ICD-10-CM | POA: Diagnosis not present

## 2015-11-23 DIAGNOSIS — J45909 Unspecified asthma, uncomplicated: Secondary | ICD-10-CM

## 2015-11-23 DIAGNOSIS — R634 Abnormal weight loss: Secondary | ICD-10-CM | POA: Diagnosis not present

## 2015-11-23 DIAGNOSIS — E0800 Diabetes mellitus due to underlying condition with hyperosmolarity without nonketotic hyperglycemic-hyperosmolar coma (NKHHC): Secondary | ICD-10-CM

## 2015-11-23 DIAGNOSIS — E1169 Type 2 diabetes mellitus with other specified complication: Secondary | ICD-10-CM

## 2015-11-23 DIAGNOSIS — J4489 Other specified chronic obstructive pulmonary disease: Secondary | ICD-10-CM

## 2015-11-23 DIAGNOSIS — J449 Chronic obstructive pulmonary disease, unspecified: Secondary | ICD-10-CM

## 2015-11-23 NOTE — Progress Notes (Signed)
Location:  [facility] clinic Provider:  Kazuko Clemence L. Mariea Clonts, D.O., C.M.D.  Code Status: DNR Goals of Care:  Advanced Directives 11/23/2015  Does patient have an advance directive? Yes  Type of Advance Directive Healthcare Power of Attorney  Copy of advanced directive(s) in chart? Yes    Chief Complaint  Patient presents with  . Medical Management of Chronic Issues    3 mth follow-up    HPI: Patient is a 80 y.o. male seen today for medical management of chronic diseases.    Reviewed CXR results with him which included persistent infiltrate bilaterally and 76mm subpleural nodule at the lateral left base.  Coughing mostly resolved.  Occasionally brings up a little mucus--clear again now.  Breathing a whole lot better.  190 lbs stable since last month.  Appetite pretty good.  Tries to control it a little.  Back to going to the gym 5 days per week.  Did miss for about 7 wks with the pneumonia.  Still working on getting his strength back up.    DMII: 130-140 mostly, one 160 on CBGs.  No lows.    Past Medical History  Diagnosis Date  . Diabetes mellitus   . Hypertension   . COPD (chronic obstructive pulmonary disease) ([place])   . Kidney disease     mild  . Hyperlipidemia   . Hypertrophy of prostate without urinary obstruction and other lower urinary tract symptoms (LUTS)   . Hypercalcemia   . Edema   . Ventral hernia, unspecified, without mention of obstruction or gangrene   . Anxiety   . Depression   . Allergy   . Unspecified sleep apnea   . Hypertrophy of prostate without urinary obstruction and other lower urinary tract symptoms (LUTS)   . Anemia, unspecified   . Unspecified disorder of kidney and ureter   . Alzheimer's disease   . Chronic maxillary sinusitis     Past Surgical History  Procedure Laterality Date  . Hernia repair    . Eyplosatory lap  2002    Allergies  Allergen Reactions  . Ace Inhibitors Cough      Medication List       This list is accurate as of:  11/23/15 10:08 AM.  Always use your most recent med list.               albuterol 108 (90 Base) MCG/ACT inhaler  Commonly known as:  PROVENTIL HFA;VENTOLIN HFA  Inhale 2 puffs into the lungs every 6 (six) hours as needed for wheezing or shortness of breath.     amLODipine 10 MG tablet  Commonly known as:  NORVASC  Take 1 tablet (10 mg total) by mouth daily.     aspirin EC 81 MG tablet  Take 1 tablet (81 mg total) by mouth daily.     atorvastatin 20 MG tablet  Commonly known as:  LIPITOR  Take 1 tablet (20 mg total) by mouth daily.     glucose blood test strip  Commonly known as:  ACCU-CHEK AVIVA  Use to test blood sugar up to three times daily Dx: E11.9     hydrALAZINE 25 MG tablet  Commonly known as:  APRESOLINE  Take 1 tablet (25 mg total) by mouth 2 (two) times daily.     ipratropium-albuterol 0.5-2.5 (3) MG/3ML Soln  Commonly known as:  DUONEB  Take 3 mLs by nebulization every 6 (six) hours as needed (wheezing).     losartan 25 MG tablet  Commonly known as:  COZAAR     metoprolol 50 MG tablet  Commonly known as:  LOPRESSOR  Take 1 tablet (50 mg total) by mouth 2 (two) times daily.     montelukast 10 MG tablet  Commonly known as:  SINGULAIR  Take 1 tablet (10 mg total) by mouth at bedtime.     PARoxetine 20 MG tablet  Commonly known as:  PAXIL  Take one tablet by mouth once daily for depression     TRADJENTA 5 MG Tabs tablet  Generic drug:  linagliptin  TAKE 1 TABLET ONE TIME DAILY TO CONTROL BLOOD SUGAR     traZODone 50 MG tablet  Commonly known as:  DESYREL  Take one tablet at bedtime for sleep as needed        Review of Systems:  Review of Systems  Constitutional: Negative for fever and chills.  HENT: Positive for congestion and hearing loss. Negative for sore throat.        Bilateral hearing aides  Eyes: Negative for blurred vision.       Glasses  Respiratory: Positive for wheezing. Negative for cough and shortness of breath.   Cardiovascular:  Negative for chest pain and leg swelling.  Gastrointestinal: Negative for abdominal pain, constipation, blood in stool and melena.  Genitourinary: Negative for dysuria, urgency and frequency.  Musculoskeletal: Negative for falls.  Neurological: Negative for dizziness and loss of consciousness.  Endo/Heme/Allergies: Does not bruise/bleed easily.  Psychiatric/Behavioral: Positive for memory loss. Negative for depression. The patient has insomnia.        Sleeping ok with trazodone    Health Maintenance  Topic Date Due  . ZOSTAVAX  01/15/1998  . FOOT EXAM  06/07/2015  . OPHTHALMOLOGY EXAM  08/27/2015  . INFLUENZA VACCINE  02/06/2016  . HEMOGLOBIN A1C  05/22/2016  . TETANUS/TDAP  08/11/2021  . PNA vac Low Risk Adult  Completed    Physical Exam: Filed Vitals:   11/23/15 0955  BP: 120/60  Pulse: 60  Temp: 98.1 F (36.7 C)  TempSrc: Oral  Height: 5\' 7"  (1.702 m)  Weight: 190 lb (86.183 kg)  SpO2: 96%   Body mass index is 29.75 kg/(m^2). Physical Exam  Constitutional: He is oriented to person, place, and time. He appears well-developed and well-nourished. No distress.  Cardiovascular: Normal rate, regular rhythm, normal heart sounds and intact distal pulses.   Pulmonary/Chest: Effort normal. He has wheezes.  Abdominal: Soft. Bowel sounds are normal. He exhibits no distension and no mass. There is no tenderness.  Musculoskeletal: Normal range of motion.  Neurological: He is alert and oriented to person, place, and time.  Some short term memory loss notable over visit  Skin: Skin is warm and dry.  Psychiatric: He has a normal mood and affect.    Labs reviewed: Basic Metabolic Panel:  Recent Labs  08/16/15 0928 10/05/15 1135 11/20/15 0812  NA 144 144 143  K 4.5 4.6 4.3  CL 106 102 104  CO2 25 24 20   GLUCOSE 172* 118* 134*  BUN 27 42* 32*  CREATININE 1.59* 1.75* 1.51*  CALCIUM 9.7 10.0 9.9   Liver Function Tests:  Recent Labs  10/05/15 1135  AST 16  ALT 22    ALKPHOS 50  BILITOT 0.5  PROT 6.4  ALBUMIN 3.0*   No results for input(s): LIPASE, AMYLASE in the last 8760 hours. No results for input(s): AMMONIA in the last 8760 hours. CBC:  Recent Labs  08/16/15 0928 10/05/15 1135  WBC 6.4 12.0*  NEUTROABS 3.8 7.6*  HCT 39.9 41.7  MCV 95 98*  PLT 161 243   Lipid Panel:  Recent Labs  05/08/15 0826 08/16/15 0928  CHOL 187 162  HDL 43 41  LDLCALC 118* 90  TRIG 128 156*  CHOLHDL 4.3 4.0   Lab Results  Component Value Date   HGBA1C 6.5* 11/20/2015    Procedures since last visit: Dg Chest 2 View  11/16/2015  CLINICAL DATA:  Followup subsequent evaluation left pneumonia EXAM: CHEST  2 VIEW COMPARISON:  Multiple prior studies including 10/31/2015 FINDINGS: Patchy left upper lobe infiltrate persists but is again mildly improved. Subpleural 5 mm nodular opacity lateral left lung base is new from prior study. Mild residual left base infiltrate is improved but does persist. No pleural effusion. Right lung remains clear. Stable cardiac enlargement. IMPRESSION: Improved but persistent patchy bilateral multinodular infiltrate consistent with slowly resolving pneumonia. Follow-up to complete resolution is necessary to exclude malignancy. Additionally there is a new 5 mm subpleural nodular opacity lateral left base. This could represent minimal peripheral atelectasis. Attention to this area on follow-up radiograph recommended to exclude developing nodule. Electronically Signed   By: Skipper Cliche M.D.   On: 11/16/2015 14:34   Dg Chest 2 View  10/31/2015  CLINICAL DATA:  Cough for 1 month.  Recent pneumonia. EXAM: CHEST  2 VIEW COMPARISON:  10/05/2015 FINDINGS: Again noted are nodular airspace opacities in the left upper lobe and lingula, improved since prior study but not completely resolved. No confluent opacity on the right. Mild hyperinflation of the lungs. Heart is borderline in size. No effusions or acute bony abnormality. IMPRESSION: Improving  but persistent nodular densities through the left upper lobe and at the left base. This likely represents improving multifocal pneumonia. Recommend continued follow-up to ensure continued improvement. Hyperinflation. Electronically Signed   By: [name] M.D.   On: 10/31/2015 11:19    Assessment/Plan 1. Abnormal chest x-ray with multiple nodules - I am concerned that this continues to persist after this long and he's losing weight -could be aspiration vs. Malignancy at this point so will check CT for better imaging - CT Chest Wo Contrast; Future  2. COPD with asthma ([place]) -has had two recent exacerbations with bronchitis, pneumonia and persistent abnormality on CXR -continues on prn albuterol, prn duonebs (not needed lately), singulair  3. Diabetes mellitus due to underlying condition with hyperosmolarity without coma, without long-term current use of insulin (HCC) -has been well controlled with diet, exercise and oral agents -continues on tradjenta only; CBGs good as in hpi  4. Early onset Alzheimer's dementia without behavioral disturbance -I'm not so sure he has AD, does not seem to progress -suspect prior stroke affected his memory and it's stayed pretty consistent (his history does not include a stroke, but this is my suspicion)  5. Hyperlipidemia associated with type 2 diabetes mellitus ([facility]CC) -cont current statin therapy, diet and exercise, last LDL more than twice goal with lipitor--will need to work on this  6. Chronic kidney disease (CKD) stage G3a/A2, moderately decreased glomerular filtration rate (GFR) between 45-59 mL/min/1.73 square meter and albuminuria creatinine ratio between 30-299 mg/g -cont to avoid nsaids, hydrate and monitor  7. Loss of weight -? Relation to xray findings vs. A result of being sick a few times in a row -await CT chest for more info  Labs/tests ordered:   Orders Placed This Encounter  Procedures  . CT Chest Wo Contrast    190 lbs/ no needs /  hta/ pt / mdc /  faxed order     Standing Status: Future     Number of Occurrences:      Standing Expiration Date: 01/22/2017    Order Specific Question:  Reason for Exam (SYMPTOM  OR DIAGNOSIS REQUIRED)    Answer:  subpleural nodular opacity lateral    Order Specific Question:  Preferred imaging location?    Answer:  [id number]    Next appt:  02/23/2016 med mgt and f/u on wt loss  Jullia Mulligan L. Kacen Mellinger, D.O. [facility] [address], [place] [address] Cell Phone (Mon-Fri 8am-5pm):  [phone] On Call:  [phone] & follow prompts after 5pm & weekends Office Phone:  [phone] Office Fax:  [phone]

## 2015-11-23 NOTE — Patient Instructions (Addendum)
Keep trying to drink 6-8 8oz glasses of water per day.    Keep going to the gym and trying to eat plenty of fruits and veggies.

## 2015-12-04 ENCOUNTER — Encounter: Payer: Self-pay | Admitting: Internal Medicine

## 2015-12-06 ENCOUNTER — Ambulatory Visit
Admission: RE | Admit: 2015-12-06 | Discharge: 2015-12-06 | Disposition: A | Discharge status: Home / Self Care | Payer: PPO | Source: Ambulatory Visit | Attending: Internal Medicine | Admitting: Internal Medicine

## 2015-12-06 DIAGNOSIS — R918 Other nonspecific abnormal finding of lung field: Secondary | ICD-10-CM

## 2015-12-08 ENCOUNTER — Telehealth: Payer: Self-pay | Admitting: *Deleted

## 2015-12-08 DIAGNOSIS — J189 Pneumonia, unspecified organism: Secondary | ICD-10-CM

## 2015-12-08 NOTE — Telephone Encounter (Signed)
Patient wife called stating someone called her regarding patient's CT results. Dr. Cyndi Lennert advice shows but will not allow to comment on the test. Patient wife notified of the results and agreed  and lab appointment scheduled and order placed.

## 2015-12-11 ENCOUNTER — Other Ambulatory Visit: Payer: PPO

## 2015-12-11 DIAGNOSIS — J189 Pneumonia, unspecified organism: Secondary | ICD-10-CM

## 2015-12-12 LAB — CBC WITH DIFFERENTIAL/PLATELET
Basophils Absolute: 0.1 10*3/uL (ref 0.0–0.2)
Basos: 1 %
EOS (ABSOLUTE): 0.3 10*3/uL (ref 0.0–0.4)
Eos: 4 %
Hematocrit: 37.8 % (ref 37.5–51.0)
Hemoglobin: 12.6 g/dL (ref 12.6–17.7)
Immature Grans (Abs): 0 10*3/uL (ref 0.0–0.1)
Immature Granulocytes: 1 %
Lymphocytes Absolute: 2.7 10*3/uL (ref 0.7–3.1)
Lymphs: 42 %
MCH: 32.1 pg (ref 26.6–33.0)
MCHC: 33.3 g/dL (ref 31.5–35.7)
MCV: 96 fL (ref 79–97)
Monocytes Absolute: 0.7 10*3/uL (ref 0.1–0.9)
Monocytes: 10 %
Neutrophils Absolute: 2.8 10*3/uL (ref 1.4–7.0)
Neutrophils: 42 %
Platelets: 153 10*3/uL (ref 150–379)
RBC: 3.92 x10E6/uL — ABNORMAL LOW (ref 4.14–5.80)
RDW: 15.7 % — ABNORMAL HIGH (ref 12.3–15.4)
WBC: 6.5 10*3/uL (ref 3.4–10.8)

## 2015-12-16 DIAGNOSIS — J449 Chronic obstructive pulmonary disease, unspecified: Secondary | ICD-10-CM | POA: Diagnosis not present

## 2015-12-28 ENCOUNTER — Other Ambulatory Visit: Payer: Self-pay | Admitting: Internal Medicine

## 2016-01-15 DIAGNOSIS — J449 Chronic obstructive pulmonary disease, unspecified: Secondary | ICD-10-CM | POA: Diagnosis not present

## 2016-01-22 ENCOUNTER — Telehealth: Payer: Self-pay | Admitting: *Deleted

## 2016-01-22 DIAGNOSIS — R0989 Other specified symptoms and signs involving the circulatory and respiratory systems: Secondary | ICD-10-CM

## 2016-01-22 MED ORDER — LEVOFLOXACIN 500 MG PO TABS: 500.0000 mg | ORAL_TABLET | Freq: Every day | ORAL | Status: DC

## 2016-01-22 NOTE — Telephone Encounter (Signed)
Spoke with patient's wife and advised results rx sent to pharmacy by e-script Order for xray put in.

## 2016-01-22 NOTE — Telephone Encounter (Signed)
Patient wife, Arbie Cookey called and stated that patient is coughing up mucus and "feels warm". She stated that the last time he got this way he ended up in the hospital. Wanted to know if you could call him in an antibiotic. No available appointments and they are planning on going out of town on Thursday. Please Advise.

## 2016-01-22 NOTE — Telephone Encounter (Signed)
Obtain chest xray at [facility] today.   Monitor temperature (get thermometer).  Notify us if his temp is elevated (normal is 98.6). Push po fluids.  Begin Levaquin 500mg  po daily for 7 days (send to pharmacy).

## 2016-01-23 ENCOUNTER — Ambulatory Visit
Admission: RE | Admit: 2016-01-23 | Discharge: 2016-01-23 | Disposition: A | Discharge status: Home / Self Care | Payer: PPO | Source: Ambulatory Visit | Attending: Internal Medicine | Admitting: Internal Medicine

## 2016-01-23 DIAGNOSIS — R079 Chest pain, unspecified: Secondary | ICD-10-CM | POA: Diagnosis not present

## 2016-01-23 DIAGNOSIS — R0989 Other specified symptoms and signs involving the circulatory and respiratory systems: Secondary | ICD-10-CM

## 2016-01-30 ENCOUNTER — Ambulatory Visit (INDEPENDENT_AMBULATORY_CARE_PROVIDER_SITE_OTHER): Payer: PPO | Admitting: Pulmonary Disease

## 2016-01-30 ENCOUNTER — Encounter: Payer: Self-pay | Admitting: Pulmonary Disease

## 2016-01-30 VITALS — BP 118/66 | HR 59 | Ht 67.0 in | Wt 193.4 lb

## 2016-01-30 DIAGNOSIS — R918 Other nonspecific abnormal finding of lung field: Secondary | ICD-10-CM

## 2016-01-30 DIAGNOSIS — J432 Centrilobular emphysema: Secondary | ICD-10-CM

## 2016-01-30 DIAGNOSIS — J449 Chronic obstructive pulmonary disease, unspecified: Secondary | ICD-10-CM

## 2016-01-30 DIAGNOSIS — J45909 Unspecified asthma, uncomplicated: Secondary | ICD-10-CM | POA: Diagnosis not present

## 2016-01-30 NOTE — Progress Notes (Signed)
Current Outpatient Prescriptions on File Prior to Visit  Medication Sig  . albuterol (PROVENTIL HFA;VENTOLIN HFA) 108 (90 Base) MCG/ACT inhaler Inhale 2 puffs into the lungs every 6 (six) hours as needed for wheezing or shortness of breath.  Marland Kitchen amLODipine (NORVASC) 10 MG tablet Take 1 tablet (10 mg total) by mouth daily.  Marland Kitchen aspirin EC 81 MG tablet Take 1 tablet (81 mg total) by mouth daily.  Marland Kitchen atorvastatin (LIPITOR) 20 MG tablet Take 1 tablet (20 mg total) by mouth daily.  Marland Kitchen glucose blood (ACCU-CHEK AVIVA) test strip Use to test blood sugar up to three times daily Dx: E11.9  . hydrALAZINE (APRESOLINE) 25 MG tablet Take 1 tablet (25 mg total) by mouth 2 (two) times daily.  Marland Kitchen ipratropium-albuterol (DUONEB) 0.5-2.5 (3) MG/3ML SOLN Take 3 mLs by nebulization every 6 (six) hours as needed (wheezing).  Marland Kitchen losartan (COZAAR) 25 MG tablet   . metoprolol (LOPRESSOR) 50 MG tablet Take 1 tablet (50 mg total) by mouth 2 (two) times daily.  . montelukast (SINGULAIR) 10 MG tablet Take 1 tablet (10 mg total) by mouth at bedtime.  Marland Kitchen PARoxetine (PAXIL) 20 MG tablet Take one tablet by mouth once daily for depression  . TRADJENTA 5 MG TABS tablet TAKE 1 TABLET ONE TIME DAILY TO CONTROL BLOOD SUGAR  . traZODone (DESYREL) 50 MG tablet Take 1 tablet by mouth at bedtime as needed for sleep   No current facility-administered medications on file prior to visit.     Chief Complaint  Patient presents with  . Follow-up    Pt c/o cough x 2 weeks with little mucus - clear to cloudy in color. Using Mucinex OTC - some relief. Pt completed Levaquin 500mg  01/28/16. Denies SOB and wheezing.     Pulmonary tests PFT 11/29/11>>FEV1 1.71 (67%), FEV1% 64, TLC 5.29 (92%), DLCO 84%, +BD CT chest 12/29/12 >> lingular PNA CT chest 12/06/15 >> mild centrilobular emphysema, nodularity in Lt upper and lower lobes  Past medical history Alzheimer's, Anxiety, Depression, DM, HLD, HTN, BPH  History of Present Illness: Ian [name] is a 80  y.o. male former smoker with chronic cough from COPD/asthma, and post-nasal drip.  I last saw him in 2015.  He was doing well until March 2017.  He developed cough with green sputum, and shortness of breath.  He lost about 18 lbs.  He has been on several rounds of antibiotics.  He had CT chest in May 2017 which showed Lt sided lung nodules.    He is feeling better.  Denies fever, chest pain, or hemoptysis.  Breathing better.  No wheeze.  Coughs when he has allergies, but no sputum.  Denies skin rash, leg swelling, or joint swelling.  Only uses prn albuterol.  Physical Exam:  General - No distress ENT - No sinus tenderness, no oral exudate Cardiac - s1s2 regular, no murmur Chest - No wheeze/rales/dullness Back - No focal tenderness Abd - Soft, non-tender Ext - No edema Neuro - Normal strength Skin - No rashes Psych - normal mood, and behavior   Assessment/Plan:  Lung nodules on CT chest from May 2017 likely infectious. - will arrange for f/u CT chest w/o contrast  COPD with asthma and emphysema. - continue singulair and prn albuterol   Patient Instructions  Will schedule CT chest and call with results  Follow up in 1 year   Ian Mires, MD [facility] Pager:  [phone] 01/30/2016, 2:09 PM

## 2016-01-30 NOTE — Patient Instructions (Signed)
Will schedule CT chest and call with results  Follow up in 1 year

## 2016-02-05 ENCOUNTER — Ambulatory Visit (INDEPENDENT_AMBULATORY_CARE_PROVIDER_SITE_OTHER)
Admission: RE | Admit: 2016-02-05 | Discharge: 2016-02-05 | Disposition: A | Discharge status: Home / Self Care | Payer: PPO | Source: Ambulatory Visit | Attending: Pulmonary Disease | Admitting: Pulmonary Disease

## 2016-02-05 ENCOUNTER — Telehealth: Payer: Self-pay | Admitting: Pulmonary Disease

## 2016-02-05 DIAGNOSIS — R918 Other nonspecific abnormal finding of lung field: Secondary | ICD-10-CM | POA: Diagnosis not present

## 2016-02-05 NOTE — Telephone Encounter (Signed)
CT chest 02/05/16 >> mild paraseptal emphysema, scar in lingula, much improved nodules in LUL   Will have my nurse inform pt that CT chest looks better.  Nodules in Lt lung resolved >> likely from previous pneumonia.  No additional radiographic follow up needed.

## 2016-02-06 NOTE — Telephone Encounter (Signed)
Spoke with pt's wife as pt has a hard time hearing on the phone. She is aware of his results. Nothing further was needed.

## 2016-02-15 DIAGNOSIS — J449 Chronic obstructive pulmonary disease, unspecified: Secondary | ICD-10-CM | POA: Diagnosis not present

## 2016-02-23 ENCOUNTER — Encounter: Payer: Self-pay | Admitting: Internal Medicine

## 2016-02-23 ENCOUNTER — Ambulatory Visit (INDEPENDENT_AMBULATORY_CARE_PROVIDER_SITE_OTHER): Payer: PPO | Admitting: Internal Medicine

## 2016-02-23 VITALS — BP 120/70 | HR 56 | Temp 98.2°F | Wt 194.0 lb

## 2016-02-23 DIAGNOSIS — R634 Abnormal weight loss: Secondary | ICD-10-CM

## 2016-02-23 DIAGNOSIS — R918 Other nonspecific abnormal finding of lung field: Secondary | ICD-10-CM | POA: Diagnosis not present

## 2016-02-23 DIAGNOSIS — E0800 Diabetes mellitus due to underlying condition with hyperosmolarity without nonketotic hyperglycemic-hyperosmolar coma (NKHHC): Secondary | ICD-10-CM

## 2016-02-23 DIAGNOSIS — J449 Chronic obstructive pulmonary disease, unspecified: Secondary | ICD-10-CM

## 2016-02-23 DIAGNOSIS — J45909 Unspecified asthma, uncomplicated: Secondary | ICD-10-CM | POA: Diagnosis not present

## 2016-02-23 DIAGNOSIS — J4489 Other specified chronic obstructive pulmonary disease: Secondary | ICD-10-CM

## 2016-02-23 DIAGNOSIS — Z23 Encounter for immunization: Secondary | ICD-10-CM | POA: Diagnosis not present

## 2016-02-23 DIAGNOSIS — I10 Essential (primary) hypertension: Secondary | ICD-10-CM

## 2016-02-23 DIAGNOSIS — N1831 Chronic kidney disease, stage 3a: Secondary | ICD-10-CM

## 2016-02-23 DIAGNOSIS — N183 Chronic kidney disease, stage 3 (moderate): Secondary | ICD-10-CM | POA: Diagnosis not present

## 2016-02-23 LAB — CBC WITH DIFFERENTIAL/PLATELET
Basophils Absolute: 75 cells/uL (ref 0–200)
Basophils Relative: 1 %
Eosinophils Absolute: 225 cells/uL (ref 15–500)
Eosinophils Relative: 3 %
HCT: 39.8 % (ref 38.5–50.0)
Hemoglobin: 13.1 g/dL — ABNORMAL LOW (ref 13.2–17.1)
Lymphocytes Relative: 38 %
Lymphs Abs: 2850 cells/uL (ref 850–3900)
MCH: 31.3 pg (ref 27.0–33.0)
MCHC: 32.9 g/dL (ref 32.0–36.0)
MCV: 95 fL (ref 80.0–100.0)
MPV: 9.9 fL (ref 7.5–12.5)
Monocytes Absolute: 675 cells/uL (ref 200–950)
Monocytes Relative: 9 %
Neutro Abs: 3675 cells/uL (ref 1500–7800)
Neutrophils Relative %: 49 %
Platelets: 154 10*3/uL (ref 140–400)
RBC: 4.19 MIL/uL — ABNORMAL LOW (ref 4.20–5.80)
RDW: 14.3 % (ref 11.0–15.0)
WBC: 7.5 10*3/uL (ref 3.8–10.8)

## 2016-02-23 LAB — LIPID PANEL
Cholesterol: 172 mg/dL (ref 125–200)
HDL: 47 mg/dL (ref >=40)
LDL Cholesterol: 96 mg/dL (ref <130)
Total CHOL/HDL Ratio: 3.7 Ratio (ref <=5.0)
Triglycerides: 144 mg/dL (ref <150)
VLDL: 29 mg/dL (ref <30)

## 2016-02-23 LAB — BASIC METABOLIC PANEL
BUN: 30 mg/dL — ABNORMAL HIGH (ref 7–25)
CO2: 27 mmol/L (ref 20–31)
Calcium: 9.7 mg/dL (ref 8.6–10.3)
Chloride: 107 mmol/L (ref 98–110)
Creat: 1.62 mg/dL — ABNORMAL HIGH (ref 0.70–1.18)
Glucose, Bld: 85 mg/dL (ref 65–99)
Potassium: 4.4 mmol/L (ref 3.5–5.3)
Sodium: 141 mmol/L (ref 135–146)

## 2016-02-23 LAB — HEMOGLOBIN A1C
Hgb A1c MFr Bld: 6 % — ABNORMAL HIGH (ref <5.7)
Mean Plasma Glucose: 126 mg/dL

## 2016-02-23 NOTE — Progress Notes (Signed)
Location:  [facility] clinic Provider:  Niccolas Loeper L. Mariea Clonts, D.O., C.M.D.  Code Status: DNR Goals of Care:  Advanced Directives 02/23/2016  Does patient have an advance directive? Yes  Type of Paramedic of [place];Living will  Does patient want to make changes to advanced directive? -  Copy of advanced directive(s) in chart? Yes  Pre-existing out of facility DNR order (yellow form or pink MOST form) -     Chief Complaint  Patient presents with  . Medical Management of Chronic Issues    8mth follow-up    HPI: Patient is a 80 y.o. male seen today for medical management of chronic diseases.    Gained a lb since last time.  Is 193lbs now.    Saw Dr. Halford Chessman.  He felt his lung nodules were likely infectious on the CT in May of this year.  He has ordered a f/u CT.    He continues on singulair and prn albuterol.  Cough and mucus unchanged now.  Doing fine.    Alzheimer's--I'm not convinced he truly has this.  He has some mild short term memory loss for a long time and it does not seem to get worse.    DMII:  Last sugar avg 6.5 in May.  Is eating well and going to the gym 5 days a week.  Helps him to feel better.  He will walk a lot with his wife when she goes shopping.     Past Medical History:  Diagnosis Date  . Allergy   . Alzheimer's disease   . Anemia, unspecified   . Anxiety   . Chronic maxillary sinusitis   . COPD (chronic obstructive pulmonary disease) ([place])   . Depression   . Diabetes mellitus   . Edema   . Hypercalcemia   . Hyperlipidemia   . Hypertension   . Hypertrophy of prostate without urinary obstruction and other lower urinary tract symptoms (LUTS)   . Hypertrophy of prostate without urinary obstruction and other lower urinary tract symptoms (LUTS)   . Kidney disease    mild  . Unspecified disorder of kidney and ureter   . Unspecified sleep apnea   . Ventral hernia, unspecified, without mention of obstruction or gangrene     Past  Surgical History:  Procedure Laterality Date  . EXPLORATORY LAPAROTOMY  2002  . HERNIA REPAIR      Allergies  Allergen Reactions  . Ace Inhibitors Cough      Medication List       Accurate as of 02/23/16 10:11 AM. Always use your most recent med list.          albuterol 108 (90 Base) MCG/ACT inhaler Commonly known as:  PROVENTIL HFA;VENTOLIN HFA Inhale 2 puffs into the lungs every 6 (six) hours as needed for wheezing or shortness of breath.   amLODipine 10 MG tablet Commonly known as:  NORVASC Take 1 tablet (10 mg total) by mouth daily.   aspirin EC 81 MG tablet Take 1 tablet (81 mg total) by mouth daily.   atorvastatin 20 MG tablet Commonly known as:  LIPITOR Take 1 tablet (20 mg total) by mouth daily.   glucose blood test strip Commonly known as:  ACCU-CHEK AVIVA Use to test blood sugar up to three times daily Dx: E11.9   hydrALAZINE 25 MG tablet Commonly known as:  APRESOLINE Take 1 tablet (25 mg total) by mouth 2 (two) times daily.   ipratropium-albuterol 0.5-2.5 (3) MG/3ML Soln Commonly known as:  DUONEB Take 3 mLs by nebulization every 6 (six) hours as needed (wheezing).   losartan 25 MG tablet Commonly known as:  COZAAR   metoprolol 50 MG tablet Commonly known as:  LOPRESSOR Take 1 tablet (50 mg total) by mouth 2 (two) times daily.   montelukast 10 MG tablet Commonly known as:  SINGULAIR Take 1 tablet (10 mg total) by mouth at bedtime.   PARoxetine 20 MG tablet Commonly known as:  PAXIL Take one tablet by mouth once daily for depression   TRADJENTA 5 MG Tabs tablet Generic drug:  linagliptin TAKE 1 TABLET ONE TIME DAILY TO CONTROL BLOOD SUGAR   traZODone 50 MG tablet Commonly known as:  DESYREL Take 1 tablet by mouth at bedtime as needed for sleep       Review of Systems:  ROS  Health Maintenance  Topic Date Due  . ZOSTAVAX  01/15/1998  . FOOT EXAM  06/07/2015  . OPHTHALMOLOGY EXAM  08/27/2015  . INFLUENZA VACCINE  02/06/2016  .  HEMOGLOBIN A1C  05/22/2016  . TETANUS/TDAP  08/11/2021  . PNA vac Low Risk Adult  Completed    Physical Exam: Vitals:   02/23/16 0958  BP: 120/70  Pulse: (!) 56  Temp: 98.2 F (36.8 C)  TempSrc: Oral  SpO2: 95%  Weight: 194 lb (88 kg)   Body mass index is 30.38 kg/m. Physical Exam  Labs reviewed: Basic Metabolic Panel:  Recent Labs  08/16/15 0928 10/05/15 1135 11/20/15 0812  NA 144 144 143  K 4.5 4.6 4.3  CL 106 102 104  CO2 25 24 20   GLUCOSE 172* 118* 134*  BUN 27 42* 32*  CREATININE 1.59* 1.75* 1.51*  CALCIUM 9.7 10.0 9.9   Liver Function Tests:  Recent Labs  10/05/15 1135  AST 16  ALT 22  ALKPHOS 50  BILITOT 0.5  PROT 6.4  ALBUMIN 3.0*   No results for input(s): LIPASE, AMYLASE in the last 8760 hours. No results for input(s): AMMONIA in the last 8760 hours. CBC:  Recent Labs  08/16/15 0928 10/05/15 1135 12/11/15 0821  WBC 6.4 12.0* 6.5  NEUTROABS 3.8 7.6* 2.8  HCT 39.9 41.7 37.8  MCV 95 98* 96  PLT 161 243 153   Lipid Panel:  Recent Labs  05/08/15 0826 08/16/15 0928  CHOL 187 162  HDL 43 41  LDLCALC 118* 90  TRIG 128 156*  CHOLHDL 4.3 4.0   Lab Results  Component Value Date   HGBA1C 6.5 (H) 11/20/2015    Procedures since last visit: Ct Chest Wo Contrast  Result Date: 02/05/2016 CLINICAL DATA:  Evaluate pulmonary nodules.  Former smoker. EXAM: CT CHEST WITHOUT CONTRAST TECHNIQUE: Multidetector CT imaging of the chest was performed following the standard protocol without IV contrast. COMPARISON:  12/06/15 FINDINGS: Cardiovascular: The heart size is mildly enlarged. There is aortic atherosclerosis noted. Calcification involving the RCA, LAD and left circumflex coronary artery noted. Mediastinum/Nodes: There is no mediastinal where hilar adenopathy. No axillary or supraclavicular adenopathy identified. Lungs/Pleura: No pleural effusion. No airspace consolidation. Mild changes of upper lobe predominant paraseptal emphysema. Diffuse  bronchial wall thickening noted. Scar versus subsegmental atelectasis is identified within the lingula. Interval decrease an multifocal areas of nodular consolidation involving the left upper lobe compatible with an inflammatory or infectious process. Residual areas of tree-in-bud nodularity within the left upper lobe are likely post infectious/inflammatory. Upper Abdomen: The adrenal glands are normal. No focal liver abnormality. The visualized portions of the spleen and adrenal glands are normal.  Musculoskeletal: Spondylosis is identified within the thoracic spine. There is osteoarthritis involving both glenohumeral joints. IMPRESSION: 1. There has been significant improvement in the appearance of the left upper lobe with resolving areas of nodular consolidation compatible with inflammatory/infectious process. 2. Residual areas of scattered tree-in-bud nodularity in the left upper lobe noted, likely inflammatory/infectious. A 3 to 71-month follow-up examination is suggested to ensure complete resolution. 3. Diffuse bronchial wall thickening with emphysema, as above; imaging findings suggestive of underlying COPD. 4. Mild cardiac enlargement, aortic atherosclerosis and coronary artery calcification. Electronically Signed   By: Kerby Moors M.D.   On: 02/05/2016 12:04   Assessment/Plan 1. COPD with asthma ([place]) - stable, continues on his singulair, duonebs prn, albuterol prn - CBC with Differential/Platelet  2. Multiple pulmonary nodules -felt to be due to pneumonia which was difficult to treat -he is finally better from this and is meant to have another CT with pulmonary, Dr. Halford Chessman, to be sure the nodules clear  3. Loss of weight - weight has stabilized - CBC with Differential/Platelet  4. Essential hypertension -bp well controlled with current regimen, cont same and monitor, no dizziness  5. Diabetes mellitus due to underlying condition with hyperosmolarity without coma, without long-term  current use of insulin (HCC) - sugar control has been improving, cont tradjenta, he continues to eat an appropriate diet most of the time [facility] -cont statin, asa - Hemoglobin A1c - Lipid panel  6. Chronic kidney disease (CKD) stage G3a/A2, moderately decreased glomerular filtration rate (GFR) between 45-59 mL/min/1.73 square meter and albuminuria creatinine ratio between 30-299 mg/g - has been stable, f/u labs, avoid nsaids - Basic metabolic panel  7. Need for immunization against influenza - Flu Vaccine QUAD 36+ mos PF IM (Fluarix & Fluzone Quad PF)  Labs/tests ordered:   Orders Placed This Encounter  Procedures  . Flu Vaccine QUAD 36+ mos PF IM (Fluarix & Fluzone Quad PF)  . Basic metabolic panel    Order Specific Question:   Has the patient fasted?    Answer:   Yes  . CBC with Differential/Platelet  . Hemoglobin A1c  . Lipid panel    Order Specific Question:   Has the patient fasted?    Answer:   Yes    Next appt:  06/03/2016  Latesha Chesney L. Brystol Wasilewski, D.O. [facility] [address], [place] [address] Cell Phone (Mon-Fri 8am-5pm):  [phone] On Call:  [phone] & follow prompts after 5pm & weekends Office Phone:  [phone] Office Fax:  [phone]

## 2016-02-26 ENCOUNTER — Encounter: Payer: Self-pay | Admitting: *Deleted

## 2016-03-17 DIAGNOSIS — J449 Chronic obstructive pulmonary disease, unspecified: Secondary | ICD-10-CM | POA: Diagnosis not present

## 2016-03-25 DIAGNOSIS — N183 Chronic kidney disease, stage 3 (moderate): Secondary | ICD-10-CM | POA: Diagnosis not present

## 2016-03-25 DIAGNOSIS — N2581 Secondary hyperparathyroidism of renal origin: Secondary | ICD-10-CM | POA: Diagnosis not present

## 2016-03-25 DIAGNOSIS — N189 Chronic kidney disease, unspecified: Secondary | ICD-10-CM | POA: Diagnosis not present

## 2016-03-25 LAB — BASIC METABOLIC PANEL
BUN: 29 mg/dL — AB (ref 4–21)
Creatinine: 1.6 mg/dL — AB (ref <=1.3)
Glucose: 126 mg/dL
POTASSIUM: 4.4 mmol/L (ref 3.4–5.3)
SODIUM: 136 mmol/L — AB (ref 137–147)

## 2016-03-25 LAB — CBC AND DIFFERENTIAL
HEMATOCRIT: 37 % — AB (ref 41–53)
HEMOGLOBIN: 12.8 g/dL — AB (ref 13.5–17.5)
Platelets: 134 10*3/uL — AB (ref 150–399)
WBC: 7.3 10^3/mL

## 2016-04-05 DIAGNOSIS — D631 Anemia in chronic kidney disease: Secondary | ICD-10-CM | POA: Diagnosis not present

## 2016-04-05 DIAGNOSIS — N183 Chronic kidney disease, stage 3 (moderate): Secondary | ICD-10-CM | POA: Diagnosis not present

## 2016-04-05 DIAGNOSIS — N2581 Secondary hyperparathyroidism of renal origin: Secondary | ICD-10-CM | POA: Diagnosis not present

## 2016-04-05 DIAGNOSIS — Z6830 Body mass index (BMI) 30.0-30.9, adult: Secondary | ICD-10-CM | POA: Diagnosis not present

## 2016-04-05 DIAGNOSIS — I129 Hypertensive chronic kidney disease with stage 1 through stage 4 chronic kidney disease, or unspecified chronic kidney disease: Secondary | ICD-10-CM | POA: Diagnosis not present

## 2016-04-08 ENCOUNTER — Encounter: Payer: Self-pay | Admitting: *Deleted

## 2016-04-08 ENCOUNTER — Other Ambulatory Visit: Payer: Self-pay | Admitting: Internal Medicine

## 2016-04-16 DIAGNOSIS — J449 Chronic obstructive pulmonary disease, unspecified: Secondary | ICD-10-CM | POA: Diagnosis not present

## 2016-05-06 ENCOUNTER — Ambulatory Visit (INDEPENDENT_AMBULATORY_CARE_PROVIDER_SITE_OTHER): Payer: PPO | Admitting: Internal Medicine

## 2016-05-06 ENCOUNTER — Encounter: Payer: Self-pay | Admitting: Internal Medicine

## 2016-05-06 VITALS — BP 140/70 | HR 58 | Temp 97.7°F | Wt 193.0 lb

## 2016-05-06 DIAGNOSIS — J441 Chronic obstructive pulmonary disease with (acute) exacerbation: Secondary | ICD-10-CM | POA: Diagnosis not present

## 2016-05-06 DIAGNOSIS — I10 Essential (primary) hypertension: Secondary | ICD-10-CM

## 2016-05-06 DIAGNOSIS — R918 Other nonspecific abnormal finding of lung field: Secondary | ICD-10-CM

## 2016-05-06 MED ORDER — DOXYCYCLINE HYCLATE 100 MG PO TABS: 100.0000 mg | ORAL_TABLET | Freq: Two times a day (BID) | ORAL | 0 refills | Status: DC

## 2016-05-06 NOTE — Patient Instructions (Signed)
Take doxycycline 100mg  twice a day for 10 days. Take yogurt with it. Drink plenty of water. Use the nebulizer every 6 hours on schedule while awake for at least the next 3 days.  If you are not getting better or have more fever, call me back.

## 2016-05-06 NOTE — Progress Notes (Signed)
Location:  [facility] clinic Provider: Ottavio Norem L. Mariea Clonts, D.O., C.M.D.  Code Status: DNR Goals of Care:  Advanced Directives 02/23/2016  Does patient have an advance directive? Yes  Type of Paramedic of [place];Living will  Does patient want to make changes to advanced directive? -  Copy of advanced directive(s) in chart? Yes  Pre-existing out of facility DNR order (yellow form or pink MOST form) -   Chief Complaint  Patient presents with  . Acute Visit    URI    HPI: Patient is a 80 y.o. male seen today for an acute visit for cough with purulent sputum--gray.  Cough started Saturday.  Immediately was purulent.  Saturday had low grade fever of 99.4.  More short of breath walking in per his wife. No big change in memory the past few days.  Didn't feel like eating much since Sat.  Having soup, juice, fluids primarily that his wife is pushing.    He was taking tussionex and coricidin D BP.  BP up anyway today.  Past Medical History:  Diagnosis Date  . Allergy   . Alzheimer's disease   . Anemia, unspecified   . Anxiety   . Chronic maxillary sinusitis   . COPD (chronic obstructive pulmonary disease) ([place])   . Depression   . Diabetes mellitus   . Edema   . Hypercalcemia   . Hyperlipidemia   . Hypertension   . Hypertrophy of prostate without urinary obstruction and other lower urinary tract symptoms (LUTS)   . Hypertrophy of prostate without urinary obstruction and other lower urinary tract symptoms (LUTS)   . Kidney disease    mild  . Unspecified disorder of kidney and ureter   . Unspecified sleep apnea   . Ventral hernia, unspecified, without mention of obstruction or gangrene     Past Surgical History:  Procedure Laterality Date  . EXPLORATORY LAPAROTOMY  2002  . HERNIA REPAIR      Allergies  Allergen Reactions  . Ace Inhibitors Cough      Medication List       Accurate as of 05/06/16  3:01 PM. Always use your most recent med list.          albuterol 108 (90 Base) MCG/ACT inhaler Commonly known as:  PROVENTIL HFA;VENTOLIN HFA Inhale 2 puffs into the lungs every 6 (six) hours as needed for wheezing or shortness of breath.   amLODipine 10 MG tablet Commonly known as:  NORVASC Take 1 tablet (10 mg total) by mouth daily.   aspirin EC 81 MG tablet Take 1 tablet (81 mg total) by mouth daily.   atorvastatin 20 MG tablet Commonly known as:  LIPITOR Take 1 tablet (20 mg total) by mouth daily.   glucose blood test strip Commonly known as:  ACCU-CHEK AVIVA Use to test blood sugar up to three times daily Dx: E11.9   hydrALAZINE 25 MG tablet Commonly known as:  APRESOLINE Take 1 tablet (25 mg total) by mouth 2 (two) times daily.   ipratropium-albuterol 0.5-2.5 (3) MG/3ML Soln Commonly known as:  DUONEB Take 3 mLs by nebulization every 6 (six) hours as needed (wheezing).   losartan 25 MG tablet Commonly known as:  COZAAR   metoprolol 50 MG tablet Commonly known as:  LOPRESSOR Take 1 tablet (50 mg total) by mouth 2 (two) times daily.   montelukast 10 MG tablet Commonly known as:  SINGULAIR Take 1 tablet (10 mg total) by mouth at bedtime.   PARoxetine 20 MG  tablet Commonly known as:  PAXIL Take one tablet by mouth once daily for depression   TRADJENTA 5 MG Tabs tablet Generic drug:  linagliptin TAKE 1 TABLET ONE TIME DAILY TO CONTROL BLOOD SUGAR   traZODone 50 MG tablet Commonly known as:  DESYREL Take 1 tablet by mouth at bedtime as needed for sleep       Review of Systems:  Review of Systems  Constitutional: Positive for chills, fever and malaise/fatigue. Negative for weight loss.  HENT: Positive for congestion.   Eyes: Negative for blurred vision.  Respiratory: Positive for cough, sputum production and shortness of breath. Negative for hemoptysis and wheezing.   Cardiovascular: Negative for chest pain and palpitations.  Gastrointestinal: Negative for abdominal pain, blood in stool, constipation and  melena.  Genitourinary: Negative for dysuria.  Musculoskeletal: Negative for falls.  Skin: Negative for itching and rash.  Neurological: Positive for weakness. Negative for dizziness and loss of consciousness.  Psychiatric/Behavioral: Negative for depression and memory loss.    Health Maintenance  Topic Date Due  . ZOSTAVAX  01/15/1998  . FOOT EXAM  06/07/2015  . OPHTHALMOLOGY EXAM  08/27/2015  . HEMOGLOBIN A1C  08/25/2016  . TETANUS/TDAP  08/11/2021  . INFLUENZA VACCINE  Completed  . PNA vac Low Risk Adult  Completed    Physical Exam: Vitals:   05/06/16 1435  BP: 140/70  Pulse: (!) 58  Temp: 97.7 F (36.5 C)  TempSrc: Oral  SpO2: 93%  Weight: 193 lb (87.5 kg)   Body mass index is 30.23 kg/m. Physical Exam  Constitutional: He is oriented to person, place, and time. He appears well-developed and well-nourished. No distress.  HENT:  Right Ear: External ear normal.  Left Ear: External ear normal.  Mouth/Throat: Oropharyngeal exudate present.  Nasal congestion  Eyes: Conjunctivae are normal.  Neck: Neck supple.  Cardiovascular: Normal rate, regular rhythm, normal heart sounds and intact distal pulses.   Pulmonary/Chest: Effort normal and breath sounds normal. No respiratory distress.  Cough productive of gray sputum  Musculoskeletal: Normal range of motion.  Lymphadenopathy:    He has no cervical adenopathy.  Neurological: He is alert and oriented to person, place, and time.  Skin: Skin is warm and dry.  Psychiatric: He has a normal mood and affect.    Labs reviewed: Basic Metabolic Panel:  Recent Labs  10/05/15 1135 11/20/15 0812 02/23/16 1031 03/25/16  NA 144 143 141 136*  K 4.6 4.3 4.4 4.4  CL 102 104 107  --   CO2 24 20 27   --   GLUCOSE 118* 134* 85  --   BUN 42* 32* 30* 29*  CREATININE 1.75* 1.51* 1.62* 1.6*  CALCIUM 10.0 9.9 9.7  --    Liver Function Tests:  Recent Labs  10/05/15 1135  AST 16  ALT 22  ALKPHOS 50  BILITOT 0.5  PROT 6.4    ALBUMIN 3.0*   No results for input(s): LIPASE, AMYLASE in the last 8760 hours. No results for input(s): AMMONIA in the last 8760 hours. CBC:  Recent Labs  10/05/15 1135 12/11/15 0821 02/23/16 1031 03/25/16  WBC 12.0* 6.5 7.5 7.3  NEUTROABS 7.6* 2.8 3,675  --   HGB  --   --  13.1* 12.8*  HCT 41.7 37.8 39.8 37*  MCV 98* 96 95.0  --   PLT 243 153 154 134*   Lipid Panel:  Recent Labs  05/08/15 0826 08/16/15 0928 02/23/16 1031  CHOL 187 162 172  HDL 43 41 47  LDLCALC 118* 90 96  TRIG 128 156* 144  CHOLHDL 4.3 4.0 3.7   Lab Results  Component Value Date   HGBA1C 6.0 (H) 02/23/2016    Procedures since last visit: Reviewed last CT chest and pulmonary notes  Assessment/Plan 1. COPD exacerbation (HCC) - doxycycline (VIBRA-TABS) 100 MG tablet; Take 1 tablet (100 mg total) by mouth 2 (two) times daily.  Dispense: 20 tablet; Refill: 0 -nebs scheduled q 6 x at least 72 hrs -push fluids -yogurt daily with abx for 10 days -notify me if no better next week or has fever after beginning abx  2. Multiple pulmonary nodules -better on last CT so no further imaging from that pneumonia episode was recommended  3. Essential hypertension -bp elevated today, but taking cough medicine that is not for BP and one that is for bp so still has high bp -monitor--has been normal otherwise  Labs/tests ordered:  No orders of the defined types were placed in this encounter.  Next appt:  06/03/2016 keep regular appt and return prn  Oprah Camarena L. Bryony Kaman, D.O. [facility] [address], [place] [address] Cell Phone (Mon-Fri 8am-5pm):  [phone] On Call:  [phone] & follow prompts after 5pm & weekends Office Phone:  [phone] Office Fax:  [phone]

## 2016-05-17 DIAGNOSIS — J449 Chronic obstructive pulmonary disease, unspecified: Secondary | ICD-10-CM | POA: Diagnosis not present

## 2016-05-29 ENCOUNTER — Other Ambulatory Visit: Payer: Self-pay

## 2016-05-29 MED ORDER — PAROXETINE HCL 20 MG PO TABS: ORAL_TABLET | ORAL | 1 refills | Status: DC

## 2016-06-03 ENCOUNTER — Ambulatory Visit (INDEPENDENT_AMBULATORY_CARE_PROVIDER_SITE_OTHER): Payer: PPO | Admitting: Internal Medicine

## 2016-06-03 ENCOUNTER — Encounter: Payer: Self-pay | Admitting: Internal Medicine

## 2016-06-03 VITALS — BP 130/80 | HR 57 | Temp 98.3°F | Ht 67.0 in | Wt 192.0 lb

## 2016-06-03 DIAGNOSIS — E785 Hyperlipidemia, unspecified: Secondary | ICD-10-CM | POA: Diagnosis not present

## 2016-06-03 DIAGNOSIS — E669 Obesity, unspecified: Secondary | ICD-10-CM | POA: Diagnosis not present

## 2016-06-03 DIAGNOSIS — E0800 Diabetes mellitus due to underlying condition with hyperosmolarity without nonketotic hyperglycemic-hyperosmolar coma (NKHHC): Secondary | ICD-10-CM | POA: Diagnosis not present

## 2016-06-03 DIAGNOSIS — Z0000 Encounter for general adult medical examination without abnormal findings: Secondary | ICD-10-CM | POA: Diagnosis not present

## 2016-06-03 DIAGNOSIS — N183 Chronic kidney disease, stage 3 (moderate): Secondary | ICD-10-CM | POA: Diagnosis not present

## 2016-06-03 DIAGNOSIS — F0280 Dementia in other diseases classified elsewhere without behavioral disturbance: Secondary | ICD-10-CM | POA: Diagnosis not present

## 2016-06-03 DIAGNOSIS — G300 Alzheimer's disease with early onset: Secondary | ICD-10-CM | POA: Diagnosis not present

## 2016-06-03 DIAGNOSIS — N1831 Chronic kidney disease, stage 3a: Secondary | ICD-10-CM

## 2016-06-03 DIAGNOSIS — E1169 Type 2 diabetes mellitus with other specified complication: Secondary | ICD-10-CM | POA: Diagnosis not present

## 2016-06-03 DIAGNOSIS — J449 Chronic obstructive pulmonary disease, unspecified: Secondary | ICD-10-CM

## 2016-06-03 DIAGNOSIS — J4489 Other specified chronic obstructive pulmonary disease: Secondary | ICD-10-CM

## 2016-06-03 LAB — COMPLETE METABOLIC PANEL WITH GFR
ALT: 13 U/L (ref 9–46)
AST: 17 U/L (ref 10–35)
Albumin: 3.8 g/dL (ref 3.6–5.1)
Alkaline Phosphatase: 53 U/L (ref 40–115)
BUN: 27 mg/dL — ABNORMAL HIGH (ref 7–25)
CO2: 28 mmol/L (ref 20–31)
Calcium: 9.9 mg/dL (ref 8.6–10.3)
Chloride: 108 mmol/L (ref 98–110)
Creat: 1.59 mg/dL — ABNORMAL HIGH (ref 0.70–1.18)
GFR, Est African American: 47 mL/min — ABNORMAL LOW (ref >=60)
GFR, Est Non African American: 41 mL/min — ABNORMAL LOW (ref >=60)
Glucose, Bld: 123 mg/dL — ABNORMAL HIGH (ref 65–99)
Potassium: 4.6 mmol/L (ref 3.5–5.3)
Sodium: 143 mmol/L (ref 135–146)
Total Bilirubin: 0.4 mg/dL (ref 0.2–1.2)
Total Protein: 6.8 g/dL (ref 6.1–8.1)

## 2016-06-03 LAB — LIPID PANEL
Cholesterol: 170 mg/dL (ref <200)
HDL: 48 mg/dL (ref >40)
LDL Cholesterol: 99 mg/dL (ref <100)
Total CHOL/HDL Ratio: 3.5 Ratio (ref <5.0)
Triglycerides: 114 mg/dL (ref <150)
VLDL: 23 mg/dL (ref <30)

## 2016-06-03 LAB — HEMOGLOBIN A1C
Hgb A1c MFr Bld: 5.8 % — ABNORMAL HIGH (ref <5.7)
Mean Plasma Glucose: 120 mg/dL

## 2016-06-03 NOTE — Progress Notes (Signed)
Location:  [facility] clinic Provider: Ritchie Klee L. Mariea Clonts, D.O., C.M.D.  Patient Care Team: Gayland Curry, DO as PCP - General (Geriatric Medicine) Zebedee Iba, MD as Referring Physician (Ophthalmology) Chesley Mires, MD as Consulting Physician (Pulmonary Disease)  Extended Emergency Contact Information Primary Emergency Contact: Vanburen,Carol Address: [place] [address] [address] [place] Phone: [phone] Mobile Phone: [phone] Relation: Spouse Secondary Emergency Contact: Casco,Matt  [place] Mobile Phone: [phone] Relation: Son  Code Status: DNR Goals of Care: Advanced Directive information Advanced Directives 06/03/2016  Does Patient Have a Medical Advance Directive? Yes  Type of Advance Directive Living will;Healthcare Power of Attorney  Does patient want to make changes to medical advance directive? -  Copy of [place] in Chart? Yes  Pre-existing out of facility DNR order (yellow form or pink MOST form) -     Chief Complaint  Patient presents with  . Annual Exam    wellness exam  . MMSE    26/30 failed clock    HPI: Patient is a 80 y.o. male seen in today for an annual wellness exam.    MMSE 26/30 down from 28/30 and failed clock (set properly, but hands not drawn right themselves).    Depression screen [facility] 2/9 06/03/2016 11/23/2015 05/12/2015 06/06/2014 03/28/2014  Decreased Interest 0 0 0 0 0  Down, Depressed, Hopeless 0 0 0 0 0  PHQ - 2 Score 0 0 0 0 0    Fall Risk  06/03/2016 11/23/2015 09/27/2015 07/11/2015 06/16/2015  Falls in the past year? No No No No No   MMSE - Mini Mental State Exam 06/03/2016 05/12/2015  Orientation to time 4 4  Orientation to Place 5 5  Registration 3 3  Attention/ Calculation 5 5  Recall 1 2  Language- name 2 objects 2 2  Language- repeat 1 1  Language- follow 3 step command 3 3  Language- read & follow direction 1 1  Write a sentence 1 1  Copy  design 0 1  Total score 26 28  failed clock  Health Maintenance  Topic Date Due  . ZOSTAVAX  01/15/1998  . FOOT EXAM  06/07/2015  . OPHTHALMOLOGY EXAM  08/27/2015  . HEMOGLOBIN A1C  08/25/2016  . TETANUS/TDAP  08/11/2021  . INFLUENZA VACCINE  Completed  . PNA vac Low Risk Adult  Completed   Functional Status Survey:        Diet? No exam data present Hearing: Dentition:  Past Medical History:  Diagnosis Date  . Allergy   . Alzheimer's disease   . Anemia, unspecified   . Anxiety   . Chronic maxillary sinusitis   . COPD (chronic obstructive pulmonary disease) ([place])   . Depression   . Diabetes mellitus   . Edema   . Hypercalcemia   . Hyperlipidemia   . Hypertension   . Hypertrophy of prostate without urinary obstruction and other lower urinary tract symptoms (LUTS)   . Hypertrophy of prostate without urinary obstruction and other lower urinary tract symptoms (LUTS)   . Kidney disease    mild  . Unspecified disorder of kidney and ureter   . Unspecified sleep apnea   . Ventral hernia, unspecified, without mention of obstruction or gangrene     Past Surgical History:  Procedure Laterality Date  . EXPLORATORY LAPAROTOMY  2002  . HERNIA REPAIR      Family History  Problem Relation Age  of Onset  . Pancreatic cancer Sister   . Heart disease Father   . Hypertension Brother   . Hypertension Sister     Social History   Social History  . Marital status: Married    Spouse name: N/A  . Number of children: 2  . Years of education: N/A   Occupational History  . retired     Scientist, clinical (histocompatibility and immunogenetics)   Social History Main Topics  . Smoking status: Former Smoker    Years: 30.00    Quit date: 07/08/1988  . Smokeless tobacco: Never Used  . Alcohol use No  . Drug use: No  . Sexual activity: Not Asked   Other Topics Concern  . None   Social History Narrative  . None    reports that he quit smoking about 27 years ago. He quit after 30.00 years of use. He has never used  smokeless tobacco. He reports that he does not drink alcohol or use drugs.  Allergies  Allergen Reactions  . Ace Inhibitors Cough      Medication List       Accurate as of 06/03/16  9:20 AM. Always use your most recent med list.          albuterol 108 (90 Base) MCG/ACT inhaler Commonly known as:  PROVENTIL HFA;VENTOLIN HFA Inhale 2 puffs into the lungs every 6 (six) hours as needed for wheezing or shortness of breath.   amLODipine 10 MG tablet Commonly known as:  NORVASC Take 1 tablet (10 mg total) by mouth daily.   aspirin EC 81 MG tablet Take 1 tablet (81 mg total) by mouth daily.   atorvastatin 20 MG tablet Commonly known as:  LIPITOR Take 1 tablet (20 mg total) by mouth daily.   glucose blood test strip Commonly known as:  ACCU-CHEK AVIVA Use to test blood sugar up to three times daily Dx: E11.9   hydrALAZINE 25 MG tablet Commonly known as:  APRESOLINE Take 1 tablet (25 mg total) by mouth 2 (two) times daily.   ipratropium-albuterol 0.5-2.5 (3) MG/3ML Soln Commonly known as:  DUONEB Take 3 mLs by nebulization every 6 (six) hours as needed (wheezing).   losartan 25 MG tablet Commonly known as:  COZAAR   metoprolol 50 MG tablet Commonly known as:  LOPRESSOR Take 1 tablet (50 mg total) by mouth 2 (two) times daily.   montelukast 10 MG tablet Commonly known as:  SINGULAIR Take 1 tablet (10 mg total) by mouth at bedtime.   PARoxetine 20 MG tablet Commonly known as:  PAXIL Take one tablet by mouth once daily for depression   TRADJENTA 5 MG Tabs tablet Generic drug:  linagliptin TAKE 1 TABLET ONE TIME DAILY TO CONTROL BLOOD SUGAR   traZODone 50 MG tablet Commonly known as:  DESYREL Take 1 tablet by mouth at bedtime as needed for sleep        Review of Systems:  Review of Systems  Constitutional: Negative for chills, fever and malaise/fatigue.  HENT: Positive for hearing loss. Negative for congestion.   Eyes: Positive for blurred vision.        Left eye  Respiratory: Positive for cough. Negative for sputum production, shortness of breath and wheezing.   Cardiovascular: Negative for chest pain, palpitations and leg swelling.  Gastrointestinal: Negative for abdominal pain, blood in stool, constipation and melena.  Genitourinary: Positive for frequency. Negative for dysuria and urgency.  Musculoskeletal: Negative for falls, joint pain and myalgias.  Skin: Negative for itching and rash.  Neurological: Negative for dizziness, tingling, sensory change, loss of consciousness, weakness and headaches.  Endo/Heme/Allergies: Does not bruise/bleed easily.  Psychiatric/Behavioral: Positive for memory loss. Negative for depression.    Physical Exam: Vitals:   06/03/16 0901  BP: 130/80  Pulse: (!) 57  Temp: 98.3 F (36.8 C)  TempSrc: Oral  SpO2: 94%  Weight: 192 lb (87.1 kg)  Height: 5\' 7"  (1.702 m)   Body mass index is 30.07 kg/m. Physical Exam  Constitutional: He is oriented to person, place, and time. He appears well-developed and well-nourished. No distress.  Eyes: EOM are normal. Pupils are equal, round, and reactive to light.  glasses  Neck: Neck supple.  Cardiovascular: Normal rate, regular rhythm, normal heart sounds and intact distal pulses.   Pulmonary/Chest: Effort normal and breath sounds normal. No respiratory distress.  Abdominal: Soft. Bowel sounds are normal.  Musculoskeletal: Normal range of motion.  Neurological: He is alert and oriented to person, place, and time. He exhibits normal muscle tone.  Skin: Skin is warm and dry.  Psychiatric: He has a normal mood and affect. His behavior is normal.    Labs reviewed: Basic Metabolic Panel:  Recent Labs  10/05/15 1135 11/20/15 0812 02/23/16 1031 03/25/16  NA 144 143 141 136*  K 4.6 4.3 4.4 4.4  CL 102 104 107  --   CO2 24 20 27   --   GLUCOSE 118* 134* 85  --   BUN 42* 32* 30* 29*  CREATININE 1.75* 1.51* 1.62* 1.6*  CALCIUM 10.0 9.9 9.7  --    Liver  Function Tests:  Recent Labs  10/05/15 1135  AST 16  ALT 22  ALKPHOS 50  BILITOT 0.5  PROT 6.4  ALBUMIN 3.0*   No results for input(s): LIPASE, AMYLASE in the last 8760 hours. No results for input(s): AMMONIA in the last 8760 hours. CBC:  Recent Labs  10/05/15 1135 12/11/15 0821 02/23/16 1031 03/25/16  WBC 12.0* 6.5 7.5 7.3  NEUTROABS 7.6* 2.8 3,675  --   HGB  --   --  13.1* 12.8*  HCT 41.7 37.8 39.8 37*  MCV 98* 96 95.0  --   PLT 243 153 154 134*   Lipid Panel:  Recent Labs  08/16/15 0928 02/23/16 1031  CHOL 162 172  HDL 41 47  LDLCALC 90 96  TRIG 156* 144  CHOLHDL 4.0 3.7   Lab Results  Component Value Date   HGBA1C 6.0 (H) 02/23/2016    Assessment/Plan 1. Medicare annual wellness visit, subsequent - completed today along with diabetic foot exam - is up to date on all vaccinations except zostavax which they have never gone to get at the pharmacy due to cost issues - Hemoglobin A1c - Lipid panel - COMPLETE METABOLIC PANEL WITH GFR  2. COPD with asthma ([place]) -continue singulair, has duonebs and albuterol but has not needed recently  3. Early onset Alzheimer's dementia without behavioral disturbance -not on meds b/c he did not tolerate aricept or namenda--has had slight decline on his mmse to 26 from 28 -cont his wife's support for his meds  4. Chronic kidney disease (CKD) stage G3a/A2, moderately decreased glomerular filtration rate (GFR) between 45-59 mL/min/1.73 square meter and albuminuria creatinine ratio between 30-299 mg/g - COMPLETE METABOLIC PANEL WITH GFR -f/u labs -encouraged hydration -cont ARB therapy, also on hydralazine  5. Hyperlipidemia associated with type 2 diabetes mellitus (HCC) - cont lipitor - Lipid panel  6. Obesity (BMI 30-39.9) -cont 5-6 days of exercise and healthy balanced diet  7. Diabetes mellitus due to underlying condition with hyperosmolarity without coma, without long-term current use of insulin ([place]) -cont  tradjenta (more samples given today to get him through to the new year)  Labs/tests ordered:   Orders Placed This Encounter  Procedures  . Hemoglobin A1c  . Lipid panel    Order Specific Question:   Has the patient fasted?    Answer:   Yes  . COMPLETE METABOLIC PANEL WITH GFR    [facility]   Next appt:  3 mos, med mgt  Lupe Handley L. Charlie Seda, D.O. [facility] [address], [place] [address] Cell Phone (Mon-Fri 8am-5pm):  [phone] On Call:  [phone] & follow prompts after 5pm & weekends Office Phone:  [phone] Office Fax:  [phone]

## 2016-06-06 ENCOUNTER — Encounter: Payer: Self-pay | Admitting: *Deleted

## 2016-07-05 ENCOUNTER — Other Ambulatory Visit: Payer: Self-pay | Admitting: Internal Medicine

## 2016-07-05 ENCOUNTER — Other Ambulatory Visit: Payer: Self-pay | Admitting: *Deleted

## 2016-07-05 MED ORDER — METOPROLOL TARTRATE 50 MG PO TABS: 50.0000 mg | ORAL_TABLET | Freq: Two times a day (BID) | ORAL | 3 refills | Status: DC

## 2016-07-05 NOTE — Telephone Encounter (Signed)
[facility]

## 2016-07-17 DIAGNOSIS — H348322 Tributary (branch) retinal vein occlusion, left eye, stable: Secondary | ICD-10-CM | POA: Diagnosis not present

## 2016-07-18 DIAGNOSIS — H34832 Tributary (branch) retinal vein occlusion, left eye, with macular edema: Secondary | ICD-10-CM | POA: Diagnosis not present

## 2016-07-18 DIAGNOSIS — H353132 Nonexudative age-related macular degeneration, bilateral, intermediate dry stage: Secondary | ICD-10-CM | POA: Diagnosis not present

## 2016-07-18 DIAGNOSIS — H43811 Vitreous degeneration, right eye: Secondary | ICD-10-CM | POA: Diagnosis not present

## 2016-07-18 DIAGNOSIS — E119 Type 2 diabetes mellitus without complications: Secondary | ICD-10-CM | POA: Diagnosis not present

## 2016-07-18 DIAGNOSIS — H25813 Combined forms of age-related cataract, bilateral: Secondary | ICD-10-CM | POA: Diagnosis not present

## 2016-07-23 ENCOUNTER — Other Ambulatory Visit: Payer: Self-pay | Admitting: *Deleted

## 2016-07-23 MED ORDER — ATORVASTATIN CALCIUM 20 MG PO TABS: ORAL_TABLET | ORAL | 3 refills | Status: DC

## 2016-07-23 NOTE — Telephone Encounter (Signed)
[employer] Pension scheme manager

## 2016-07-31 ENCOUNTER — Telehealth: Payer: Self-pay

## 2016-07-31 ENCOUNTER — Other Ambulatory Visit: Payer: Self-pay | Admitting: *Deleted

## 2016-07-31 MED ORDER — LINAGLIPTIN 5 MG PO TABS: ORAL_TABLET | ORAL | 1 refills | Status: DC

## 2016-07-31 MED ORDER — AMBULATORY NON FORMULARY MEDICATION: 11 refills | Status: DC

## 2016-07-31 NOTE — Telephone Encounter (Signed)
Patient called requesting samples of Tradjenta, samples placed at the front desk for pick-up.

## 2016-07-31 NOTE — Telephone Encounter (Signed)
Received fax from [facility] stating that patient requests new Rx for One Touch Delica Lancets. Faxed.

## 2016-08-02 ENCOUNTER — Other Ambulatory Visit: Payer: Self-pay | Admitting: Internal Medicine

## 2016-08-03 ENCOUNTER — Other Ambulatory Visit: Payer: Self-pay | Admitting: Internal Medicine

## 2016-08-05 ENCOUNTER — Other Ambulatory Visit: Payer: Self-pay | Admitting: *Deleted

## 2016-08-05 MED ORDER — ONETOUCH ULTRASOFT LANCETS MISC: 12 refills | Status: DC

## 2016-08-05 MED ORDER — ONETOUCH ULTRA SYSTEM W/DEVICE KIT: PACK | 0 refills | Status: DC

## 2016-08-05 MED ORDER — GLUCOSE BLOOD VI STRP: ORAL_STRIP | 12 refills | Status: DC

## 2016-08-05 NOTE — Telephone Encounter (Signed)
Spoke with patient's wife, ok to change machine. New Meter, Test Strips, and lancets sent to pharmacy

## 2016-08-05 NOTE — Telephone Encounter (Signed)
[facility] [address] [facility] sent fax. Insurance will not cover Aviva plus. Will cover One Touch or Freestyle.

## 2016-08-15 ENCOUNTER — Other Ambulatory Visit: Payer: Self-pay | Admitting: *Deleted

## 2016-08-15 MED ORDER — HYDRALAZINE HCL 25 MG PO TABS: 25.0000 mg | ORAL_TABLET | Freq: Two times a day (BID) | ORAL | 3 refills | Status: DC

## 2016-08-15 MED ORDER — AMLODIPINE BESYLATE 10 MG PO TABS: 10.0000 mg | ORAL_TABLET | Freq: Every day | ORAL | 3 refills | Status: DC

## 2016-08-15 NOTE — Telephone Encounter (Signed)
rx sent to pharmacy by e-script  

## 2016-08-26 ENCOUNTER — Encounter: Payer: Self-pay | Admitting: Internal Medicine

## 2016-08-26 ENCOUNTER — Ambulatory Visit (INDEPENDENT_AMBULATORY_CARE_PROVIDER_SITE_OTHER): Payer: PPO | Admitting: Internal Medicine

## 2016-08-26 VITALS — BP 138/60 | HR 59 | Temp 97.6°F | Wt 194.0 lb

## 2016-08-26 DIAGNOSIS — E0800 Diabetes mellitus due to underlying condition with hyperosmolarity without nonketotic hyperglycemic-hyperosmolar coma (NKHHC): Secondary | ICD-10-CM

## 2016-08-26 DIAGNOSIS — R739 Hyperglycemia, unspecified: Secondary | ICD-10-CM

## 2016-08-26 LAB — GLUCOSE, POCT (MANUAL RESULT ENTRY): POC Glucose: 166 mg/dl — AB (ref 70–99)

## 2016-08-26 NOTE — Patient Instructions (Addendum)
Cut down on the jelly beans and nabs.   Keep your regular appointment.

## 2016-08-26 NOTE — Progress Notes (Signed)
Location:  [place]   Place of Service:   clinic  Provider: Makayla Lanter L. Mariea Clonts, D.O., C.M.D.  Code Status: DNR Goals of Care:  Advanced Directives 08/26/2016  Does Patient Have a Medical Advance Directive? Yes  Type of Paramedic of [place];Living will  Does patient want to make changes to medical advance directive? -  Copy of [place] in Chart? Yes  Pre-existing out of facility DNR order (yellow form or pink MOST form) -   Chief Complaint  Patient presents with  . Follow-up    discuss glucose levels    HPI: Patient is a 81 y.o. male seen today for an acute visit for high blood sugars.  Sugar has been running high the past few weeks today (FBG 148), was 166 today in office (post-prandial). Has been eating more sweets and fried foods. Has been eating peanut butter crackers, jelly beans. Is still exercising 6 days a week going to the gym (walking and lifting weights), then goes to [employer] to walk. Is taking Tradjenta once daily. In nov. A1C was 5.8., prior to that 6.0.   Past Medical History:  Diagnosis Date  . Allergy   . Alzheimer's disease   . Anemia, unspecified   . Anxiety   . Chronic maxillary sinusitis   . COPD (chronic obstructive pulmonary disease) ([place])   . Depression   . Diabetes mellitus   . Edema   . Hypercalcemia   . Hyperlipidemia   . Hypertension   . Hypertrophy of prostate without urinary obstruction and other lower urinary tract symptoms (LUTS)   . Hypertrophy of prostate without urinary obstruction and other lower urinary tract symptoms (LUTS)   . Kidney disease    mild  . Unspecified disorder of kidney and ureter   . Unspecified sleep apnea   . Ventral hernia, unspecified, without mention of obstruction or gangrene     Past Surgical History:  Procedure Laterality Date  . EXPLORATORY LAPAROTOMY  2002  . HERNIA REPAIR      Allergies  Allergen Reactions  . Ace Inhibitors Cough    Allergies as of  08/26/2016      Reactions   Ace Inhibitors Cough      Medication List       Accurate as of 08/26/16 10:26 AM. Always use your most recent med list.          albuterol 108 (90 Base) MCG/ACT inhaler Commonly known as:  PROVENTIL HFA;VENTOLIN HFA Inhale 2 puffs into the lungs every 6 (six) hours as needed for wheezing or shortness of breath.   amLODipine 10 MG tablet Commonly known as:  NORVASC Take 1 tablet (10 mg total) by mouth daily.   aspirin EC 81 MG tablet Take 1 tablet (81 mg total) by mouth daily.   atorvastatin 20 MG tablet Commonly known as:  LIPITOR Take one tablet by mouth once daily for cholesterol   glucose blood test strip One Touch Ultra Test Strips use to test blood sugar up to three times daily. Dx: E11.9   hydrALAZINE 25 MG tablet Commonly known as:  APRESOLINE Take 1 tablet (25 mg total) by mouth 2 (two) times daily.   ipratropium-albuterol 0.5-2.5 (3) MG/3ML Soln Commonly known as:  DUONEB Take 3 mLs by nebulization every 6 (six) hours as needed (wheezing).   linagliptin 5 MG Tabs tablet Commonly known as:  TRADJENTA TAKE 1 TABLET ONE TIME DAILY TO CONTROL BLOOD SUGAR   losartan 25 MG tablet Commonly  known as:  COZAAR   metoprolol 50 MG tablet Commonly known as:  LOPRESSOR Take 1 tablet (50 mg total) by mouth 2 (two) times daily.   montelukast 10 MG tablet Commonly known as:  SINGULAIR Take 1 tablet (10 mg total) by mouth at bedtime.   ONE TOUCH ULTRA SYSTEM KIT w/Device Kit Use to test blood sugar up to three times daily. Dx: E11.9   onetouch ultrasoft lancets Use to test blood sugar up to three times daily. Dx E11.9   PARoxetine 20 MG tablet Commonly known as:  PAXIL Take one tablet by mouth once daily for depression   traZODone 50 MG tablet Commonly known as:  DESYREL Take 1 tablet by mouth at bedtime as needed for sleep       Review of Systems:  Review of Systems  Constitutional: Negative for activity change, appetite  change, chills and fever.  Respiratory: Negative for shortness of breath.   Cardiovascular: Negative for chest pain.  Gastrointestinal: Negative for abdominal distention.  Genitourinary: Negative for dysuria, frequency and urgency.    Health Maintenance  Topic Date Due  . ZOSTAVAX  01/15/1998  . OPHTHALMOLOGY EXAM  08/27/2015  . HEMOGLOBIN A1C  12/01/2016  . FOOT EXAM  06/03/2017  . TETANUS/TDAP  08/11/2021  . INFLUENZA VACCINE  Completed  . PNA vac Low Risk Adult  Completed    Physical Exam: Vitals:   08/26/16 1007  BP: 138/60  Pulse: (!) 59  Temp: 97.6 F (36.4 C)  TempSrc: Oral  SpO2: 96%  Weight: 194 lb (88 kg)   Body mass index is 30.38 kg/m. Physical Exam  Constitutional: He is oriented to person, place, and time. He appears well-developed and well-nourished. No distress.  Cardiovascular: Normal rate, regular rhythm and normal heart sounds.   Pulmonary/Chest: Effort normal and breath sounds normal. No respiratory distress.  Abdominal: Soft. Bowel sounds are normal.  Musculoskeletal: Normal range of motion.  Neurological: He is alert and oriented to person, place, and time.  Skin: Skin is warm and dry.   Labs reviewed: Basic Metabolic Panel:  Recent Labs  11/20/15 0812 02/23/16 1031 03/25/16 06/03/16 0950  NA 143 141 136* 143  K 4.3 4.4 4.4 4.6  CL 104 107  --  108  CO2 20 27  --  28  GLUCOSE 134* 85  --  123*  BUN 32* 30* 29* 27*  CREATININE 1.51* 1.62* 1.6* 1.59*  CALCIUM 9.9 9.7  --  9.9   Liver Function Tests:  Recent Labs  10/05/15 1135 06/03/16 0950  AST 16 17  ALT 22 13  ALKPHOS 50 53  BILITOT 0.5 0.4  PROT 6.4 6.8  ALBUMIN 3.0* 3.8   No results for input(s): LIPASE, AMYLASE in the last 8760 hours. No results for input(s): AMMONIA in the last 8760 hours. CBC:  Recent Labs  10/05/15 1135 12/11/15 0821 02/23/16 1031 03/25/16  WBC 12.0* 6.5 7.5 7.3  NEUTROABS 7.6* 2.8 3,675  --   HGB  --   --  13.1* 12.8*  HCT 41.7 37.8 39.8  37*  MCV 98* 96 95.0  --   PLT 243 153 154 134*   Lipid Panel:  Recent Labs  02/23/16 1031 06/03/16 0950  CHOL 172 170  HDL 47 48  LDLCALC 96 99  TRIG 144 114  CHOLHDL 3.7 3.5   Lab Results  Component Value Date   HGBA1C 5.8 (H) 06/03/2016    Assessment/Plan 1. Diabetes mellitus due to underlying condition with hyperosmolarity without  coma, without long-term current use of insulin ([place]) - well controlled, but has now had elevated glucose with intake of jelly beans and nabs so advised to cut back on these, cont exercise program - POC Glucose (CBG)  2. Hyperglycemia -see#1  Labs/tests ordered:   Orders Placed This Encounter  Procedures  . POC Glucose (CBG)    Next appt:  09/05/2016

## 2016-09-05 ENCOUNTER — Ambulatory Visit: Payer: PPO | Admitting: Internal Medicine

## 2016-09-20 ENCOUNTER — Ambulatory Visit (INDEPENDENT_AMBULATORY_CARE_PROVIDER_SITE_OTHER): Payer: PPO | Admitting: Internal Medicine

## 2016-09-20 ENCOUNTER — Encounter: Payer: Self-pay | Admitting: Internal Medicine

## 2016-09-20 VITALS — BP 140/60 | HR 56 | Temp 98.4°F | Wt 194.0 lb

## 2016-09-20 DIAGNOSIS — E0800 Diabetes mellitus due to underlying condition with hyperosmolarity without nonketotic hyperglycemic-hyperosmolar coma (NKHHC): Secondary | ICD-10-CM | POA: Diagnosis not present

## 2016-09-20 DIAGNOSIS — I152 Hypertension secondary to endocrine disorders: Secondary | ICD-10-CM

## 2016-09-20 DIAGNOSIS — E1159 Type 2 diabetes mellitus with other circulatory complications: Secondary | ICD-10-CM | POA: Diagnosis not present

## 2016-09-20 DIAGNOSIS — I10 Essential (primary) hypertension: Secondary | ICD-10-CM | POA: Diagnosis not present

## 2016-09-20 LAB — GLUCOSE, POCT (MANUAL RESULT ENTRY): POC Glucose: 151 mg/dl — AB (ref 70–99)

## 2016-09-20 MED ORDER — LOSARTAN POTASSIUM 50 MG PO TABS: 50.0000 mg | ORAL_TABLET | Freq: Every day | ORAL | 3 refills | Status: DC

## 2016-09-20 MED ORDER — GLUCOSE BLOOD VI STRP: ORAL_STRIP | 0 refills | Status: DC

## 2016-09-20 NOTE — Patient Instructions (Signed)
Increase your losartan to 50mg  daily (you may take two 25mg  pills until you use those up, but I sent the new prescription to your mail order pharmacy).    Try to watch your salt intake with things like bacon and sausage and adding salt--this increases your blood pressure.    Be careful with candy and desserts which make your sugar shoot up.  I am not going to change your diabetes regimen right now.  If your fasting sugar is over 200, please call me.  Continue exercising.

## 2016-09-20 NOTE — Progress Notes (Signed)
Location:  [facility] clinic Provider: Jamaiya Tunnell L. Mariea Clonts, D.O., C.M.D.  Code Status: DNR Goals of Care:  Advanced Directives 08/26/2016  Does Patient Have a Medical Advance Directive? Yes  Type of Paramedic of [place];Living will  Does patient want to make changes to medical advance directive? -  Copy of [place] in Chart? Yes  Pre-existing out of facility DNR order (yellow form or pink MOST form) -   Chief Complaint  Patient presents with  . Acute Visit    blood pressure high, blood sugar high    HPI: Patient is a 81 y.o. male seen today for an acute visit for high blood pressure and high blood sugar.    Fasting glucose elevated at 150 this am, but pt ate brownies last night.  Oddly he is out of strips so not clear how he  Is checking.  Reviewed his record of sugars and they have been ranging from 110-180 roughly.  He notated where he'd "eaten too much" which was with the 162 and 178 readings.  He has not had any fasting over 200.  He requests tradjenta samples.  This morning's bp is elevated at 140/60 with goal <130/80.  145, 155 and highest ever was 702 systolic.  Discussed sodium intake.  Does eat bacon and eggs or sausage and eggs.    Past Medical History:  Diagnosis Date  . Allergy   . Alzheimer's disease   . Anemia, unspecified   . Anxiety   . Chronic maxillary sinusitis   . COPD (chronic obstructive pulmonary disease) ([place])   . Depression   . Diabetes mellitus   . Edema   . Hypercalcemia   . Hyperlipidemia   . Hypertension   . Hypertrophy of prostate without urinary obstruction and other lower urinary tract symptoms (LUTS)   . Hypertrophy of prostate without urinary obstruction and other lower urinary tract symptoms (LUTS)   . Kidney disease    mild  . Unspecified disorder of kidney and ureter   . Unspecified sleep apnea   . Ventral hernia, unspecified, without mention of obstruction or gangrene     Past Surgical History:    Procedure Laterality Date  . EXPLORATORY LAPAROTOMY  2002  . HERNIA REPAIR      Allergies  Allergen Reactions  . Ace Inhibitors Cough    Allergies as of 09/20/2016      Reactions   Ace Inhibitors Cough      Medication List       Accurate as of 09/20/16  8:30 AM. Always use your most recent med list.          albuterol 108 (90 Base) MCG/ACT inhaler Commonly known as:  PROVENTIL HFA;VENTOLIN HFA Inhale 2 puffs into the lungs every 6 (six) hours as needed for wheezing or shortness of breath.   amLODipine 10 MG tablet Commonly known as:  NORVASC Take 1 tablet (10 mg total) by mouth daily.   aspirin EC 81 MG tablet Take 1 tablet (81 mg total) by mouth daily.   atorvastatin 20 MG tablet Commonly known as:  LIPITOR Take one tablet by mouth once daily for cholesterol   glucose blood test strip One Touch Ultra Test Strips use to test blood sugar up to three times daily. Dx: E11.9   hydrALAZINE 25 MG tablet Commonly known as:  APRESOLINE Take 1 tablet (25 mg total) by mouth 2 (two) times daily.   ipratropium-albuterol 0.5-2.5 (3) MG/3ML Soln Commonly known as:  DUONEB  Take 3 mLs by nebulization every 6 (six) hours as needed (wheezing).   linagliptin 5 MG Tabs tablet Commonly known as:  TRADJENTA TAKE 1 TABLET ONE TIME DAILY TO CONTROL BLOOD SUGAR   losartan 25 MG tablet Commonly known as:  COZAAR   metoprolol 50 MG tablet Commonly known as:  LOPRESSOR Take 1 tablet (50 mg total) by mouth 2 (two) times daily.   montelukast 10 MG tablet Commonly known as:  SINGULAIR Take 1 tablet (10 mg total) by mouth at bedtime.   ONE TOUCH ULTRA SYSTEM KIT w/Device Kit Use to test blood sugar up to three times daily. Dx: E11.9   onetouch ultrasoft lancets Use to test blood sugar up to three times daily. Dx E11.9   PARoxetine 20 MG tablet Commonly known as:  PAXIL Take one tablet by mouth once daily for depression   traZODone 50 MG tablet Commonly known as:   DESYREL Take 1 tablet by mouth at bedtime as needed for sleep       Review of Systems:  Review of Systems  Constitutional: Negative for chills, fever and malaise/fatigue.  HENT: Positive for hearing loss.   Eyes: Negative for blurred vision.       Glasses  Respiratory: Negative for cough and shortness of breath.   Cardiovascular: Negative for chest pain, palpitations and leg swelling.  Gastrointestinal: Negative for abdominal pain, blood in stool, constipation, diarrhea, heartburn, melena, nausea and vomiting.  Genitourinary: Negative for dysuria.  Musculoskeletal: Negative for falls.  Skin: Negative for itching and rash.  Neurological: Negative for dizziness, loss of consciousness and weakness.  Endo/Heme/Allergies: Does not bruise/bleed easily.       Diabetes  Psychiatric/Behavioral: Positive for memory loss. Negative for depression.    Health Maintenance  Topic Date Due  . OPHTHALMOLOGY EXAM  08/27/2015  . HEMOGLOBIN A1C  12/01/2016  . FOOT EXAM  06/03/2017  . TETANUS/TDAP  08/11/2021  . INFLUENZA VACCINE  Completed  . PNA vac Low Risk Adult  Completed    Physical Exam: Vitals:   09/20/16 0823  BP: 140/60  Pulse: (!) 56  Temp: 98.4 F (36.9 C)  TempSrc: Oral  SpO2: 93%  Weight: 194 lb (88 kg)   Body mass index is 30.38 kg/m. Physical Exam  Constitutional: He is oriented to person, place, and time. He appears well-developed and well-nourished. No distress.  Cardiovascular: Normal rate, regular rhythm, normal heart sounds and intact distal pulses.   Pulmonary/Chest: Effort normal and breath sounds normal. No respiratory distress.  Abdominal: Soft. Bowel sounds are normal. He exhibits no distension. There is no tenderness.  Musculoskeletal: Normal range of motion. He exhibits no tenderness.  Neurological: He is alert and oriented to person, place, and time.  Skin: Skin is warm and dry. Capillary refill takes less than 2 seconds.  Psychiatric: He has a normal  mood and affect.    Labs reviewed: Basic Metabolic Panel:  Recent Labs  11/20/15 0812 02/23/16 1031 03/25/16 06/03/16 0950  NA 143 141 136* 143  K 4.3 4.4 4.4 4.6  CL 104 107  --  108  CO2 20 27  --  28  GLUCOSE 134* 85  --  123*  BUN 32* 30* 29* 27*  CREATININE 1.51* 1.62* 1.6* 1.59*  CALCIUM 9.9 9.7  --  9.9   Liver Function Tests:  Recent Labs  10/05/15 1135 06/03/16 0950  AST 16 17  ALT 22 13  ALKPHOS 50 53  BILITOT 0.5 0.4  PROT 6.4 6.8  ALBUMIN 3.0* 3.8   No results for input(s): LIPASE, AMYLASE in the last 8760 hours. No results for input(s): AMMONIA in the last 8760 hours. CBC:  Recent Labs  10/05/15 1135 12/11/15 0821 02/23/16 1031 03/25/16  WBC 12.0* 6.5 7.5 7.3  NEUTROABS 7.6* 2.8 3,675  --   HGB  --   --  13.1* 12.8*  HCT 41.7 37.8 39.8 37*  MCV 98* 96 95.0  --   PLT 243 153 154 134*   Lipid Panel:  Recent Labs  02/23/16 1031 06/03/16 0950  CHOL 172 170  HDL 47 48  LDLCALC 96 99  TRIG 144 114  CHOLHDL 3.7 3.5   Lab Results  Component Value Date   HGBA1C 5.8 (H) 06/03/2016    Assessment/Plan 1. Diabetes mellitus due to underlying condition with hyperosmolarity without coma, without long-term current use of insulin (HCC) - some fasting hyperglycemia due to his food choices--counseled on this and he expressed understanding, given written instructions, diabetes regimen unchanged - POC Glucose (CBG) - losartan (COZAAR) 50 MG tablet; Take 1 tablet (50 mg total) by mouth daily.  Dispense: 90 tablet; Refill: 3 - Basic metabolic panel; Future - Hemoglobin A1c; Future - Lipid panel; Future  2. Hypertension associated with diabetes ([place]) -bp elevated today and has been high at home, as well - increased cozaar to 45m from 212m(use two tabs until used up or mail order received) - losartan (COZAAR) 50 MG tablet; Take 1 tablet (50 mg total) by mouth daily.  Dispense: 90 tablet; Refill: 3 - Basic metabolic panel; Future - Hemoglobin A1c;  Future - Lipid panel; Future  Labs/tests ordered:   Orders Placed This Encounter  Procedures  . Basic metabolic panel    Standing Status:   Future    Standing Expiration Date:   12/21/2016    Order Specific Question:   Has the patient fasted?    Answer:   Yes  . Hemoglobin A1c    Standing Status:   Future    Standing Expiration Date:   12/21/2016  . Lipid panel    Standing Status:   Future    Standing Expiration Date:   12/21/2016    Order Specific Question:   Has the patient fasted?    Answer:   Yes  . POC Glucose (CBG)   Next appt:  11/04/2016   Lylith Bebeau L. Elisa Sorlie, D.O. [facility] [address] [place] [address] Phone (Mon-Fri 8am-5pm):  [phone] Call:  [phone] follow prompts after 5pm & weekends Office Phone:  [phone] Fax:  [phone]

## 2016-09-23 DIAGNOSIS — N189 Chronic kidney disease, unspecified: Secondary | ICD-10-CM | POA: Diagnosis not present

## 2016-09-23 DIAGNOSIS — N2581 Secondary hyperparathyroidism of renal origin: Secondary | ICD-10-CM | POA: Diagnosis not present

## 2016-09-23 DIAGNOSIS — N183 Chronic kidney disease, stage 3 (moderate): Secondary | ICD-10-CM | POA: Diagnosis not present

## 2016-10-01 ENCOUNTER — Other Ambulatory Visit: Payer: Self-pay | Admitting: *Deleted

## 2016-10-01 MED ORDER — MONTELUKAST SODIUM 10 MG PO TABS: 10.0000 mg | ORAL_TABLET | Freq: Every day | ORAL | 3 refills | Status: DC

## 2016-10-01 NOTE — Telephone Encounter (Signed)
Renovo ([place])

## 2016-10-03 DIAGNOSIS — N183 Chronic kidney disease, stage 3 (moderate): Secondary | ICD-10-CM | POA: Diagnosis not present

## 2016-10-03 DIAGNOSIS — Z6830 Body mass index (BMI) 30.0-30.9, adult: Secondary | ICD-10-CM | POA: Diagnosis not present

## 2016-10-03 DIAGNOSIS — D631 Anemia in chronic kidney disease: Secondary | ICD-10-CM | POA: Diagnosis not present

## 2016-10-03 DIAGNOSIS — N2581 Secondary hyperparathyroidism of renal origin: Secondary | ICD-10-CM | POA: Diagnosis not present

## 2016-10-03 DIAGNOSIS — I129 Hypertensive chronic kidney disease with stage 1 through stage 4 chronic kidney disease, or unspecified chronic kidney disease: Secondary | ICD-10-CM | POA: Diagnosis not present

## 2016-10-10 ENCOUNTER — Telehealth: Payer: Self-pay

## 2016-10-10 DIAGNOSIS — N183 Chronic kidney disease, stage 3 (moderate): Principal | ICD-10-CM

## 2016-10-10 DIAGNOSIS — E1122 Type 2 diabetes mellitus with diabetic chronic kidney disease: Secondary | ICD-10-CM

## 2016-10-10 MED ORDER — ONETOUCH ULTRA 2 W/DEVICE KIT: PACK | 0 refills | Status: DC

## 2016-10-10 NOTE — Telephone Encounter (Signed)
Patient called stating he lost his diabetic meter, patient requested that we send One Touch Ultra meter to [facility] on [address] sent

## 2016-10-30 ENCOUNTER — Other Ambulatory Visit: Payer: PPO

## 2016-10-30 DIAGNOSIS — E1159 Type 2 diabetes mellitus with other circulatory complications: Secondary | ICD-10-CM

## 2016-10-30 DIAGNOSIS — E0800 Diabetes mellitus due to underlying condition with hyperosmolarity without nonketotic hyperglycemic-hyperosmolar coma (NKHHC): Secondary | ICD-10-CM | POA: Diagnosis not present

## 2016-10-30 DIAGNOSIS — I10 Essential (primary) hypertension: Secondary | ICD-10-CM

## 2016-10-30 LAB — LIPID PANEL
Cholesterol: 154 mg/dL (ref <200)
HDL: 39 mg/dL — ABNORMAL LOW (ref >40)
LDL Cholesterol: 94 mg/dL (ref <100)
Total CHOL/HDL Ratio: 3.9 Ratio (ref <5.0)
Triglycerides: 103 mg/dL (ref <150)
VLDL: 21 mg/dL (ref <30)

## 2016-10-30 LAB — BASIC METABOLIC PANEL
BUN: 36 mg/dL — ABNORMAL HIGH (ref 7–25)
CO2: 26 mmol/L (ref 20–31)
Calcium: 9.7 mg/dL (ref 8.6–10.3)
Chloride: 108 mmol/L (ref 98–110)
Creat: 1.7 mg/dL — ABNORMAL HIGH (ref 0.70–1.18)
Glucose, Bld: 118 mg/dL — ABNORMAL HIGH (ref 65–99)
Potassium: 4.5 mmol/L (ref 3.5–5.3)
Sodium: 142 mmol/L (ref 135–146)

## 2016-10-31 ENCOUNTER — Encounter: Payer: Self-pay | Admitting: *Deleted

## 2016-10-31 ENCOUNTER — Other Ambulatory Visit: Payer: PPO

## 2016-10-31 LAB — HEMOGLOBIN A1C
Hgb A1c MFr Bld: 5.7 % — ABNORMAL HIGH (ref <5.7)
Mean Plasma Glucose: 117 mg/dL

## 2016-11-04 ENCOUNTER — Encounter: Payer: Self-pay | Admitting: Internal Medicine

## 2016-11-04 ENCOUNTER — Ambulatory Visit (INDEPENDENT_AMBULATORY_CARE_PROVIDER_SITE_OTHER): Payer: PPO | Admitting: Internal Medicine

## 2016-11-04 VITALS — BP 138/70 | HR 56 | Temp 98.1°F | Wt 194.0 lb

## 2016-11-04 DIAGNOSIS — E1122 Type 2 diabetes mellitus with diabetic chronic kidney disease: Secondary | ICD-10-CM | POA: Diagnosis not present

## 2016-11-04 DIAGNOSIS — N183 Chronic kidney disease, stage 3 unspecified: Secondary | ICD-10-CM

## 2016-11-04 DIAGNOSIS — Z23 Encounter for immunization: Secondary | ICD-10-CM | POA: Diagnosis not present

## 2016-11-04 DIAGNOSIS — J449 Chronic obstructive pulmonary disease, unspecified: Secondary | ICD-10-CM | POA: Diagnosis not present

## 2016-11-04 DIAGNOSIS — E1159 Type 2 diabetes mellitus with other circulatory complications: Secondary | ICD-10-CM | POA: Diagnosis not present

## 2016-11-04 DIAGNOSIS — H6123 Impacted cerumen, bilateral: Secondary | ICD-10-CM | POA: Diagnosis not present

## 2016-11-04 DIAGNOSIS — I152 Hypertension secondary to endocrine disorders: Secondary | ICD-10-CM

## 2016-11-04 DIAGNOSIS — J4489 Other specified chronic obstructive pulmonary disease: Secondary | ICD-10-CM

## 2016-11-04 DIAGNOSIS — F0280 Dementia in other diseases classified elsewhere without behavioral disturbance: Secondary | ICD-10-CM | POA: Diagnosis not present

## 2016-11-04 DIAGNOSIS — I10 Essential (primary) hypertension: Secondary | ICD-10-CM

## 2016-11-04 DIAGNOSIS — G300 Alzheimer's disease with early onset: Secondary | ICD-10-CM

## 2016-11-04 DIAGNOSIS — G3184 Mild cognitive impairment, so stated: Secondary | ICD-10-CM | POA: Insufficient documentation

## 2016-11-04 MED ORDER — ZOSTER VAC RECOMB ADJUVANTED 50 MCG/0.5ML IM SUSR: 0.5000 mL | Freq: Once | INTRAMUSCULAR | 1 refills | Status: AC

## 2016-11-04 NOTE — Progress Notes (Signed)
Location:  [facility] clinic Provider:  Jodette Wik L. Mariea Clonts, D.O., C.M.D.  Code Status: DNR Goals of Care:  Advanced Directives 11/04/2016  Does Patient Have a Medical Advance Directive? Yes  Type of Paramedic of [place];Living will  Does patient want to make changes to medical advance directive? -  Copy of [place] in Chart? Yes  Pre-existing out of facility DNR order (yellow form or pink MOST form) -   Chief Complaint  Patient presents with  . Medical Management of Chronic Issues    6 week follow-up    HPI: Patient is a 81 y.o. male seen today for medical management of chronic diseases.    Continues his routine.   Occasionally cheats and eats jelly beans or other candy.  Right ear feels like it's full of wax.  He wanted to go to his audiologist, but they were full for months.  Hearing worse despite right hearing aid.  Sugar avg trending down. Going to the gym.  Trying to eat right.   Bad cholesterol also improved slightly.  Past Medical History:  Diagnosis Date  . Allergy   . Alzheimer's disease   . Anemia, unspecified   . Anxiety   . Chronic maxillary sinusitis   . COPD (chronic obstructive pulmonary disease) ([place])   . Depression   . Diabetes mellitus   . Edema   . Hypercalcemia   . Hyperlipidemia   . Hypertension   . Hypertrophy of prostate without urinary obstruction and other lower urinary tract symptoms (LUTS)   . Hypertrophy of prostate without urinary obstruction and other lower urinary tract symptoms (LUTS)   . Kidney disease    mild  . Unspecified disorder of kidney and ureter   . Unspecified sleep apnea   . Ventral hernia, unspecified, without mention of obstruction or gangrene     Past Surgical History:  Procedure Laterality Date  . EXPLORATORY LAPAROTOMY  2002  . HERNIA REPAIR      Allergies  Allergen Reactions  . Ace Inhibitors Cough    Allergies as of 11/04/2016      Reactions   Ace Inhibitors Cough        Medication List       Accurate as of 11/04/16 10:28 AM. Always use your most recent med list.          albuterol 108 (90 Base) MCG/ACT inhaler Commonly known as:  PROVENTIL HFA;VENTOLIN HFA Inhale 2 puffs into the lungs every 6 (six) hours as needed for wheezing or shortness of breath.   amLODipine 10 MG tablet Commonly known as:  NORVASC Take 1 tablet (10 mg total) by mouth daily.   aspirin EC 81 MG tablet Take 1 tablet (81 mg total) by mouth daily.   atorvastatin 20 MG tablet Commonly known as:  LIPITOR Take one tablet by mouth once daily for cholesterol   glucose blood test strip Commonly known as:  ONE TOUCH ULTRA TEST Use as instructed to test blood sugar once daily dx E119   hydrALAZINE 25 MG tablet Commonly known as:  APRESOLINE Take 1 tablet (25 mg total) by mouth 2 (two) times daily.   ipratropium-albuterol 0.5-2.5 (3) MG/3ML Soln Commonly known as:  DUONEB Take 3 mLs by nebulization every 6 (six) hours as needed (wheezing).   linagliptin 5 MG Tabs tablet Commonly known as:  TRADJENTA TAKE 1 TABLET ONE TIME DAILY TO CONTROL BLOOD SUGAR   losartan 50 MG tablet Commonly known as:  COZAAR Take 1  tablet (50 mg total) by mouth daily.   metoprolol 50 MG tablet Commonly known as:  LOPRESSOR Take 1 tablet (50 mg total) by mouth 2 (two) times daily.   montelukast 10 MG tablet Commonly known as:  SINGULAIR Take 1 tablet (10 mg total) by mouth at bedtime.   ONE TOUCH ULTRA 2 w/Device Kit Test blood sugar three times daily DX E11.9   onetouch ultrasoft lancets Use to test blood sugar up to three times daily. Dx E11.9   PARoxetine 20 MG tablet Commonly known as:  PAXIL Take one tablet by mouth once daily for depression   traZODone 50 MG tablet Commonly known as:  DESYREL Take 1 tablet by mouth at bedtime as needed for sleep       Review of Systems:  Review of Systems  Constitutional: Negative for chills, fever and malaise/fatigue.  HENT:  Positive for hearing loss. Negative for congestion.        Ear wax right greater than left  Eyes: Negative for blurred vision.       Glasses  Respiratory: Negative for shortness of breath.   Cardiovascular: Negative for chest pain, palpitations and leg swelling.  Gastrointestinal: Negative for abdominal pain, blood in stool, constipation, heartburn and melena.  Genitourinary: Negative for dysuria.  Musculoskeletal: Negative for falls and joint pain.  Skin: Negative for itching and rash.  Neurological: Negative for dizziness, loss of consciousness and weakness.  Psychiatric/Behavioral: Positive for memory loss. Negative for depression. The patient is not nervous/anxious and does not have insomnia.     Health Maintenance  Topic Date Due  . OPHTHALMOLOGY EXAM  08/27/2015  . INFLUENZA VACCINE  02/05/2017  . HEMOGLOBIN A1C  05/01/2017  . FOOT EXAM  06/03/2017  . TETANUS/TDAP  08/11/2021  . PNA vac Low Risk Adult  Completed    Physical Exam: Vitals:   11/04/16 1001  BP: 138/70  Pulse: (!) 56  Temp: 98.1 F (36.7 C)  TempSrc: Oral  SpO2: 95%  Weight: 194 lb (88 kg)   Body mass index is 30.38 kg/m. Physical Exam  Constitutional: He is oriented to person, place, and time. He appears well-developed and well-nourished. No distress.  HENT:  Head: Normocephalic and atraumatic.  Right ear with cerumen impaction and increased cerumen in left as well  Cardiovascular: Normal rate, regular rhythm, normal heart sounds and intact distal pulses.   Pulmonary/Chest: Effort normal and breath sounds normal. No respiratory distress.  Musculoskeletal: Normal range of motion.  Neurological: He is alert and oriented to person, place, and time.  Some short term memory loss  Skin: Skin is warm and dry.  Psychiatric: He has a normal mood and affect.    Labs reviewed: Basic Metabolic Panel:  Recent Labs  02/23/16 1031 03/25/16 06/03/16 0950 10/30/16 0844  NA 141 136* 143 142  K 4.4 4.4 4.6  4.5  CL 107  --  108 108  CO2 27  --  28 26  GLUCOSE 85  --  123* 118*  BUN 30* 29* 27* 36*  CREATININE 1.62* 1.6* 1.59* 1.70*  CALCIUM 9.7  --  9.9 9.7   Liver Function Tests:  Recent Labs  06/03/16 0950  AST 17  ALT 13  ALKPHOS 53  BILITOT 0.4  PROT 6.8  ALBUMIN 3.8   No results for input(s): LIPASE, AMYLASE in the last 8760 hours. No results for input(s): AMMONIA in the last 8760 hours. CBC:  Recent Labs  12/11/15 0821 02/23/16 1031 03/25/16  WBC 6.5 7.5   7.3  NEUTROABS 2.8 3,675  --   HGB  --  13.1* 12.8*  HCT 37.8 39.8 37*  MCV 96 95.0  --   PLT 153 154 134*   Lipid Panel:  Recent Labs  02/23/16 1031 06/03/16 0950 10/30/16 0844  CHOL 172 170 154  HDL 47 48 39*  LDLCALC 96 99 94  TRIG 144 114 103  CHOLHDL 3.7 3.5 3.9   Lab Results  Component Value Date   HGBA1C 5.7 (H) 10/30/2016    Assessment/Plan 1. Type 2 diabetes mellitus with stage 3 chronic kidney disease, without long-term current use of insulin (HCC) -cont same regimen, tradjenta samples given  2. Hypertension associated with diabetes ([place]) -bp well controlled, is on arb  3. COPD with asthma ([place]) -no difficulty recently with exacerbation, cont same regimen  4. Early onset Alzheimer's dementia without behavioral disturbance -mild, seems he's been stable for quite some time (?more vascular)  5. Need for shingles vaccine -[facility]ingrix Rx sent to [facility] pharmacy for him to go get first one today  6. Bilateral impacted cerumen -both ears were flushed with warm water and peroxide and pt able to hear better afterwards  Labs/tests ordered:  Orders Placed This Encounter  Procedures  . Hemoglobin A1c    Standing Status:   Future    Standing Expiration Date:   05/06/2017  . Basic metabolic panel    Standing Status:   Future    Standing Expiration Date:   05/06/2017    Order Specific Question:   Has the patient fasted?    Answer:   Yes    Next appt:  3 mos med mgt, labs before  Alma Avianah Pellman, D.O. [facility] [address], [place] [address] Cell Phone (Mon-Fri 8am-5pm):  [phone] On Call:  [phone] & follow prompts after 5pm & weekends Office Phone:  [phone] Office Fax:  [phone]

## 2016-12-03 ENCOUNTER — Other Ambulatory Visit: Payer: Self-pay | Admitting: Internal Medicine

## 2016-12-13 ENCOUNTER — Telehealth: Payer: Self-pay

## 2016-12-13 NOTE — Telephone Encounter (Signed)
Patient called to verify dosage and instructions of Losartan. Patient informed he is to take 50 mg once daily   Patient has some 25 mg tablets and was calling to confirm that he can take 2 to total 50 mg and then start the 50 mg bottle once daily

## 2016-12-30 ENCOUNTER — Inpatient Hospital Stay (HOSPITAL_COMMUNITY)
Admission: EM | Admit: 2016-12-30 | Discharge: 2017-01-06 | DRG: 389 | Disposition: A | Discharge status: Home / Self Care | Payer: PPO | Attending: Family Medicine | Admitting: Family Medicine

## 2016-12-30 ENCOUNTER — Encounter (HOSPITAL_COMMUNITY): Payer: Self-pay

## 2016-12-30 ENCOUNTER — Ambulatory Visit
Admission: RE | Admit: 2016-12-30 | Discharge: 2016-12-30 | Disposition: A | Discharge status: Home / Self Care | Payer: PPO | Source: Ambulatory Visit | Attending: Nurse Practitioner | Admitting: Nurse Practitioner

## 2016-12-30 ENCOUNTER — Ambulatory Visit (INDEPENDENT_AMBULATORY_CARE_PROVIDER_SITE_OTHER): Payer: PPO | Admitting: Nurse Practitioner

## 2016-12-30 ENCOUNTER — Encounter: Payer: Self-pay | Admitting: Nurse Practitioner

## 2016-12-30 VITALS — BP 108/64 | HR 73 | Temp 97.4°F | Resp 17 | Ht 67.0 in | Wt 187.0 lb

## 2016-12-30 DIAGNOSIS — F329 Major depressive disorder, single episode, unspecified: Secondary | ICD-10-CM | POA: Diagnosis not present

## 2016-12-30 DIAGNOSIS — E785 Hyperlipidemia, unspecified: Secondary | ICD-10-CM | POA: Diagnosis not present

## 2016-12-30 DIAGNOSIS — E119 Type 2 diabetes mellitus without complications: Secondary | ICD-10-CM

## 2016-12-30 DIAGNOSIS — K56600 Partial intestinal obstruction, unspecified as to cause: Secondary | ICD-10-CM | POA: Diagnosis not present

## 2016-12-30 DIAGNOSIS — N1831 Chronic kidney disease, stage 3a: Secondary | ICD-10-CM | POA: Diagnosis present

## 2016-12-30 DIAGNOSIS — J449 Chronic obstructive pulmonary disease, unspecified: Secondary | ICD-10-CM | POA: Diagnosis present

## 2016-12-30 DIAGNOSIS — K5669 Other partial intestinal obstruction: Secondary | ICD-10-CM | POA: Diagnosis not present

## 2016-12-30 DIAGNOSIS — R197 Diarrhea, unspecified: Secondary | ICD-10-CM

## 2016-12-30 DIAGNOSIS — N183 Chronic kidney disease, stage 3 unspecified: Secondary | ICD-10-CM

## 2016-12-30 DIAGNOSIS — Z87891 Personal history of nicotine dependence: Secondary | ICD-10-CM | POA: Diagnosis not present

## 2016-12-30 DIAGNOSIS — F0280 Dementia in other diseases classified elsewhere without behavioral disturbance: Secondary | ICD-10-CM | POA: Diagnosis not present

## 2016-12-30 DIAGNOSIS — N179 Acute kidney failure, unspecified: Secondary | ICD-10-CM | POA: Diagnosis not present

## 2016-12-30 DIAGNOSIS — I129 Hypertensive chronic kidney disease with stage 1 through stage 4 chronic kidney disease, or unspecified chronic kidney disease: Secondary | ICD-10-CM | POA: Diagnosis present

## 2016-12-30 DIAGNOSIS — D72829 Elevated white blood cell count, unspecified: Secondary | ICD-10-CM | POA: Diagnosis not present

## 2016-12-30 DIAGNOSIS — F419 Anxiety disorder, unspecified: Secondary | ICD-10-CM | POA: Diagnosis present

## 2016-12-30 DIAGNOSIS — Z7984 Long term (current) use of oral hypoglycemic drugs: Secondary | ICD-10-CM

## 2016-12-30 DIAGNOSIS — Z888 Allergy status to other drugs, medicaments and biological substances status: Secondary | ICD-10-CM

## 2016-12-30 DIAGNOSIS — Z0189 Encounter for other specified special examinations: Secondary | ICD-10-CM

## 2016-12-30 DIAGNOSIS — G300 Alzheimer's disease with early onset: Secondary | ICD-10-CM | POA: Diagnosis present

## 2016-12-30 DIAGNOSIS — R109 Unspecified abdominal pain: Secondary | ICD-10-CM

## 2016-12-30 DIAGNOSIS — Z8719 Personal history of other diseases of the digestive system: Secondary | ICD-10-CM | POA: Diagnosis not present

## 2016-12-30 DIAGNOSIS — Z7982 Long term (current) use of aspirin: Secondary | ICD-10-CM

## 2016-12-30 DIAGNOSIS — E1129 Type 2 diabetes mellitus with other diabetic kidney complication: Secondary | ICD-10-CM

## 2016-12-30 DIAGNOSIS — Z66 Do not resuscitate: Secondary | ICD-10-CM | POA: Diagnosis not present

## 2016-12-30 DIAGNOSIS — K56609 Unspecified intestinal obstruction, unspecified as to partial versus complete obstruction: Secondary | ICD-10-CM | POA: Diagnosis present

## 2016-12-30 DIAGNOSIS — R0902 Hypoxemia: Secondary | ICD-10-CM | POA: Diagnosis not present

## 2016-12-30 DIAGNOSIS — R140 Abdominal distension (gaseous): Secondary | ICD-10-CM | POA: Diagnosis not present

## 2016-12-30 DIAGNOSIS — E1122 Type 2 diabetes mellitus with diabetic chronic kidney disease: Secondary | ICD-10-CM | POA: Diagnosis not present

## 2016-12-30 DIAGNOSIS — Z4682 Encounter for fitting and adjustment of non-vascular catheter: Secondary | ICD-10-CM | POA: Diagnosis not present

## 2016-12-30 DIAGNOSIS — Z79899 Other long term (current) drug therapy: Secondary | ICD-10-CM | POA: Diagnosis not present

## 2016-12-30 DIAGNOSIS — G3184 Mild cognitive impairment, so stated: Secondary | ICD-10-CM | POA: Diagnosis present

## 2016-12-30 DIAGNOSIS — R112 Nausea with vomiting, unspecified: Secondary | ICD-10-CM | POA: Diagnosis not present

## 2016-12-30 LAB — COMPREHENSIVE METABOLIC PANEL
ALK PHOS: 49 U/L (ref 38–126)
ALT: 14 U/L — AB (ref 17–63)
AST: 17 U/L (ref 15–41)
Albumin: 3.8 g/dL (ref 3.5–5.0)
Anion gap: 7 (ref 5–15)
BILIRUBIN TOTAL: 0.4 mg/dL (ref 0.3–1.2)
BUN: 42 mg/dL — AB (ref 6–20)
CALCIUM: 10.2 mg/dL (ref 8.9–10.3)
CO2: 26 mmol/L (ref 22–32)
CREATININE: 2.14 mg/dL — AB (ref 0.61–1.24)
Chloride: 108 mmol/L (ref 101–111)
GFR calc Af Amer: 32 mL/min — ABNORMAL LOW (ref >60)
GFR, EST NON AFRICAN AMERICAN: 28 mL/min — AB (ref >60)
Glucose, Bld: 132 mg/dL — ABNORMAL HIGH (ref 65–99)
Potassium: 4.4 mmol/L (ref 3.5–5.1)
Sodium: 141 mmol/L (ref 135–145)
TOTAL PROTEIN: 6.9 g/dL (ref 6.5–8.1)

## 2016-12-30 LAB — CBC WITH DIFFERENTIAL/PLATELET
BASOS ABS: 0 {cells}/uL (ref 0–200)
Basophils Relative: 0 %
EOS ABS: 0 {cells}/uL — AB (ref 15–500)
Eosinophils Relative: 0 %
HEMATOCRIT: 41.9 % (ref 38.5–50.0)
HEMOGLOBIN: 13.7 g/dL (ref 13.2–17.1)
LYMPHS ABS: 1456 {cells}/uL (ref 850–3900)
Lymphocytes Relative: 13 %
MCH: 31.6 pg (ref 27.0–33.0)
MCHC: 32.7 g/dL (ref 32.0–36.0)
MCV: 96.8 fL (ref 80.0–100.0)
MONO ABS: 1008 {cells}/uL — AB (ref 200–950)
MPV: 9.4 fL (ref 7.5–12.5)
Monocytes Relative: 9 %
NEUTROS ABS: 8736 {cells}/uL — AB (ref 1500–7800)
NEUTROS PCT: 78 %
Platelets: 186 10*3/uL (ref 140–400)
RBC: 4.33 MIL/uL (ref 4.20–5.80)
RDW: 14.6 % (ref 11.0–15.0)
WBC: 11.2 10*3/uL — ABNORMAL HIGH (ref 3.8–10.8)

## 2016-12-30 LAB — CBC
HCT: 41.3 % (ref 39.0–52.0)
Hemoglobin: 13.7 g/dL (ref 13.0–17.0)
MCH: 32 pg (ref 26.0–34.0)
MCHC: 33.2 g/dL (ref 30.0–36.0)
MCV: 96.5 fL (ref 78.0–100.0)
PLATELETS: 163 10*3/uL (ref 150–400)
RBC: 4.28 MIL/uL (ref 4.22–5.81)
RDW: 14.3 % (ref 11.5–15.5)
WBC: 11.3 10*3/uL — AB (ref 4.0–10.5)

## 2016-12-30 LAB — COMPLETE METABOLIC PANEL WITH GFR
ALBUMIN: 3.8 g/dL (ref 3.6–5.1)
ALK PHOS: 53 U/L (ref 40–115)
ALT: 10 U/L (ref 9–46)
AST: 15 U/L (ref 10–35)
BUN: 42 mg/dL — AB (ref 7–25)
CO2: 24 mmol/L (ref 20–31)
Calcium: 10.2 mg/dL (ref 8.6–10.3)
Chloride: 107 mmol/L (ref 98–110)
Creat: 2.11 mg/dL — ABNORMAL HIGH (ref 0.70–1.18)
GFR, EST NON AFRICAN AMERICAN: 29 mL/min — AB (ref >=60)
GFR, Est African American: 34 mL/min — ABNORMAL LOW (ref >=60)
GLUCOSE: 155 mg/dL — AB (ref 65–99)
POTASSIUM: 4.3 mmol/L (ref 3.5–5.3)
SODIUM: 142 mmol/L (ref 135–146)
Total Bilirubin: 0.5 mg/dL (ref 0.2–1.2)
Total Protein: 6.5 g/dL (ref 6.1–8.1)

## 2016-12-30 LAB — I-STAT CG4 LACTIC ACID, ED: LACTIC ACID, VENOUS: 0.92 mmol/L (ref 0.5–1.9)

## 2016-12-30 LAB — LIPASE, BLOOD: Lipase: 28 U/L (ref 11–51)

## 2016-12-30 MED ORDER — IOPAMIDOL (ISOVUE-300) INJECTION 61%
INTRAVENOUS | Status: AC
  Administered 2016-12-30: 22:00:00
  Filled 2016-12-30: qty 30

## 2016-12-30 MED ORDER — ONDANSETRON HCL 4 MG/2ML IJ SOLN
4.0000 mg | Freq: Once | INTRAMUSCULAR | Status: AC
  Administered 2016-12-30: 4 mg via INTRAVENOUS
  Filled 2016-12-30: qty 2

## 2016-12-30 MED ORDER — SODIUM CHLORIDE 0.9 % IV BOLUS (SEPSIS)
1000.0000 mL | Freq: Once | INTRAVENOUS | Status: AC
  Administered 2016-12-31: 1000 mL via INTRAVENOUS

## 2016-12-30 NOTE — Patient Instructions (Addendum)
Will get lab work today and xray at [facility]- [facility] as tolerate Increase hydration Abdominal Pain, Adult Abdominal pain can be caused by many things. Often, abdominal pain is not serious and it gets better with no treatment or by being treated at home. However, sometimes abdominal pain is serious. Your health care provider will do a medical history and a physical exam to try to determine the cause of your abdominal pain. Follow these instructions at home:  Take over-the-counter and prescription medicines only as told by your health care provider. Do not take a laxative unless told by your health care provider.  Drink enough fluid to keep your urine clear or pale yellow.  Watch your condition for any changes.  Keep all follow-up visits as told by your health care provider. This is important. Contact a health care provider if:  Your abdominal pain changes or gets worse.  You are not hungry or you lose weight without trying.  You are constipated or have diarrhea for more than 2-3 days.  You have pain when you urinate or have a bowel movement.  Your abdominal pain wakes you up at night.  Your pain gets worse with meals, after eating, or with certain foods.  You are throwing up and cannot keep anything down.  You have a fever. Get help right away if:  Your pain does not go away as soon as your health care provider told you to expect.  You cannot stop throwing up.  Your pain is only in areas of the abdomen, such as the right side or the left lower portion of the abdomen.  You have bloody or black stools, or stools that look like tar.  You have severe pain, cramping, or bloating in your abdomen.  You have signs of dehydration, such as: ? Dark urine, very little urine, or no urine. ? Cracked lips. ? Dry mouth. ? Sunken eyes. ? Sleepiness. ? Weakness. This information is not intended to replace advice given to you by your health care provider. Make  sure you discuss any questions you have with your health care provider. Document Released: 04/03/2005 Document Revised: 01/12/2016 Document Reviewed: 12/06/2015 Elsevier Interactive Patient Education  2017 [employer].

## 2016-12-30 NOTE — ED Provider Notes (Signed)
[facility] DEPT Provider Note   CSN: [id number] Arrival date & time: 12/30/16  1514    History   Chief Complaint No chief complaint on file.   HPI Ian [name] is a 81 y.o. male.  81 year old male with a history of diabetes, hypertension, COPD, kidney disease, dyslipidemia, Alzheimer's, anxiety and depression presents to the emergency department over concern for bowel obstruction. Wife reports symptom onset yesterday. Patient had one episode of emesis. Wife states it was "very little". Patient also with increased flatus and diarrhea, though he has had decreased bowel movements today. When asked if he has been able to pass flatus today, he responds "not much". He reports a generalized abdominal discomfort. This has been fairly constant. No medications taken prior to arrival for pain. He saw his primary care doctor today with an outpatient x-ray concerning for bowel obstruction. Abdominal surgical history significant for hernia repair and exploratory laparotomy. No fevers, urinary symptoms, melena, hematochezia, sick contacts.     Past Medical History:  Diagnosis Date  . Allergy   . Alzheimer's disease   . Anemia, unspecified   . Anxiety   . Chronic maxillary sinusitis   . COPD (chronic obstructive pulmonary disease) ([place])   . Depression   . Diabetes mellitus   . Edema   . Hypercalcemia   . Hyperlipidemia   . Hypertension   . Hypertrophy of prostate without urinary obstruction and other lower urinary tract symptoms (LUTS)   . Hypertrophy of prostate without urinary obstruction and other lower urinary tract symptoms (LUTS)   . Kidney disease    mild  . Unspecified disorder of kidney and ureter   . Unspecified sleep apnea   . Ventral hernia, unspecified, without mention of obstruction or gangrene     Patient Active Problem List   Diagnosis Date Noted  . Early onset Alzheimer's dementia without behavioral disturbance 11/04/2016  . Chronic kidney disease (CKD) stage G3a/A2,  moderately decreased glomerular filtration rate (GFR) between 45-59 mL/min/1.73 square meter and albuminuria creatinine ratio between 30-299 mg/g 11/23/2015  . Loss of weight 11/23/2015  . Acute upper respiratory infection 07/11/2015  . Cataract, nuclear 08/26/2014  . Obesity (BMI 30-39.9) 02/03/2014  . Chronic kidney disease (CKD) 10/20/2013  . Hyperlipidemia associated with type 2 diabetes mellitus ([place]) 09/17/2013  . Cellulitis of left hand 02/12/2013  . Edema of hand 01/07/2013  . Depression 01/07/2013  . Insomnia 01/07/2013  . Fluid overload 01/02/2013  . UTI (lower urinary tract infection) 12/29/2012  . Diabetes mellitus ([place]) 12/29/2012  . Anxiety   . Alzheimer's disease   . Hypertension associated with diabetes ([place])   . Hyperlipidemia   . Degenerative drusen 07/07/2012  . COPD with asthma ([place]) 12/03/2011  . Seasonal allergic rhinitis 12/03/2011  . Branch retinal vein occlusion 06/28/2011  . 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    Past Surgical History:  Procedure Laterality Date  . EXPLORATORY LAPAROTOMY  2002  . HERNIA REPAIR         Home Medications    Prior to Admission medications   Medication Sig Start Date End Date Taking? Authorizing Provider  albuterol (PROVENTIL HFA;VENTOLIN HFA) 108 (90 Base) MCG/ACT inhaler Inhale 2 puffs into the lungs every 6 (six) hours as needed for wheezing or shortness of breath. 10/04/15  Yes [name], [name] L, DO  amLODipine (NORVASC) 10 MG tablet Take 1 tablet (10 mg total) by mouth daily. 08/15/16  Yes [name], [name] L, DO  aspirin EC 81 MG tablet Take 1 tablet (81 mg total) by  mouth daily. 10/06/14  Yes [name], [name] L, DO  atorvastatin (LIPITOR) 20 MG tablet Take one tablet by mouth once daily for cholesterol 07/23/16  Yes [name], [name] L, DO  Blood Glucose Monitoring Suppl (ONE TOUCH ULTRA 2) w/Device KIT Test blood sugar three times daily DX E11.9 10/10/16  Yes [name], [name] L, DO  glucose blood (ONE TOUCH ULTRA TEST) test strip Use as  instructed to test blood sugar once daily dx E119 09/20/16  Yes [name], [name] L, DO  hydrALAZINE (APRESOLINE) 25 MG tablet Take 1 tablet (25 mg total) by mouth 2 (two) times daily. 08/15/16  Yes [name], [name] L, DO  ipratropium-albuterol (DUONEB) 0.5-2.5 (3) MG/3ML SOLN Take 3 mLs by nebulization every 6 (six) hours as needed (wheezing). 04/17/15  Yes [name], [name] L, DO  Lancets (ONETOUCH ULTRASOFT) lancets Use to test blood sugar up to three times daily. Dx E11.9 08/05/16  Yes [name], [name] L, DO  linagliptin (TRADJENTA) 5 MG TABS tablet TAKE 1 TABLET ONE TIME DAILY TO CONTROL BLOOD SUGAR 07/31/16  Yes Estill Dooms, MD  losartan (COZAAR) 50 MG tablet Take 1 tablet (50 mg total) by mouth daily. 09/20/16  Yes [name], [name] L, DO  metoprolol (LOPRESSOR) 50 MG tablet Take 1 tablet (50 mg total) by mouth 2 (two) times daily. 07/05/16  Yes [name], [name] L, DO  montelukast (SINGULAIR) 10 MG tablet Take 1 tablet (10 mg total) by mouth at bedtime. Patient taking differently: Take 10 mg by mouth daily as needed (allergies).  10/01/16  Yes [name], [name] L, DO  PARoxetine (PAXIL) 20 MG tablet Take 1 tablet by mouth once daily for depression 12/03/16  Yes [name], [name] L, DO  traZODone (DESYREL) 50 MG tablet Take 1 tablet by mouth at bedtime as needed for sleep 07/05/16  Yes Gayland Curry, DO    Family History Family History  Problem Relation Age of Onset  . Pancreatic cancer Sister   . Heart disease Father   . Hypertension Brother   . Hypertension Sister     Social History Social History  Substance Use Topics  . Smoking status: Former Smoker    Years: 30.00    Quit date: 07/08/1988  . Smokeless tobacco: Never Used  . Alcohol use No     Allergies   Ace inhibitors   Review of Systems Review of Systems Ten systems reviewed and are negative for acute change, except as noted in the HPI.    Physical Exam Updated Vital Signs BP (!) 124/55   Pulse 60   Temp 99.6 F (37.6 C) (Oral)   Resp 20    Wt 86.4 kg (190 lb 6.4 oz)   SpO2 94%   BMI 29.82 kg/m   Physical Exam  Constitutional: He is oriented to person, place, and time. He appears well-developed and well-nourished. No distress.  Pleasant and in NAD  HENT:  Head: Normocephalic and atraumatic.  Eyes: Conjunctivae and EOM are normal. No scleral icterus.  Neck: Normal range of motion.  Cardiovascular: Normal rate, regular rhythm and intact distal pulses.   Pulmonary/Chest: Effort normal. No respiratory distress. He has no wheezes. He has no rales.  Respirations even and unlabored. Lungs CTAB.  Abdominal: Soft. There is tenderness.  Mild diffuse TTP with hypoactive bowel sounds. Umbilical hernia. Abdomen soft, distended. No rigidity.  Musculoskeletal: Normal range of motion.  Neurological: He is alert and oriented to person, place, and time. He exhibits normal muscle tone. Coordination normal.  Skin: Skin is warm and dry.  No rash noted. He is not diaphoretic. No erythema. No pallor.  Psychiatric: He has a normal mood and affect. His behavior is normal.  Nursing note and vitals reviewed.    ED Treatments / Results  Labs (all labs ordered are listed, but only abnormal results are displayed) Labs Reviewed  COMPREHENSIVE METABOLIC PANEL - Abnormal; Notable for the following:       Result Value   Glucose, Bld 132 (*)    BUN 42 (*)    Creatinine, Ser 2.14 (*)    ALT 14 (*)    GFR calc non Af Amer 28 (*)    GFR calc Af Amer 32 (*)    All other components within normal limits  CBC - Abnormal; Notable for the following:    WBC 11.3 (*)    All other components within normal limits  LIPASE, BLOOD  I-STAT CG4 LACTIC ACID, ED    EKG  EKG Interpretation None       Radiology Ct Abdomen Pelvis Wo Contrast  Result Date: 12/31/2016 CLINICAL DATA:  Abdominal pain. EXAM: CT ABDOMEN AND PELVIS WITHOUT CONTRAST TECHNIQUE: Multidetector CT imaging of the abdomen and pelvis was performed following the standard protocol  without IV contrast. COMPARISON:  Radiographs 12/30/2016 FINDINGS: Lower chest: No acute abnormality. Hepatobiliary: Several small calculi are present in the gallbladder lumen. No bile duct dilatation. No focal liver lesions. Pancreas: Unremarkable. No pancreatic ductal dilatation or surrounding inflammatory changes. Spleen: Normal in size without focal abnormality. Adrenals/Urinary Tract: Adrenal glands are unremarkable. Kidneys are normal, without renal calculi, focal lesion, or hydronephrosis. Bladder is unremarkable. Stomach/Bowel: Moderate dilatation of stomach and small bowel. Mild small bowel mural thickening and edema. Distal small bowel is relatively decompressed but an abrupt focal caliber transition is not evident. No focal inflammation of bowel. No mass. No pneumatosis. No extraluminal air. Colon is normal. Vascular/Lymphatic: Moderate atherosclerotic calcification. No aneurysm. Nonspecific nodes in the inguinal regions, more likely benign. Reproductive: Unremarkable Other: No ascites. Musculoskeletal: No significant skeletal lesion. IMPRESSION: 1. Moderate dilatation of small bowel in its proximal and midportions, probably a low-grade obstruction. No mass or hernia is evident. No bowel perforation. 2. Cholelithiasis. 3. Aortic atherosclerosis. Electronically Signed   By: Andreas Newport M.D.   On: 12/31/2016 00:32   Dg Abd 1 View  Result Date: 12/30/2016 CLINICAL DATA:  Nausea, vomiting, and diarrhea for 1 week, history diabetes mellitus, hypertension, COPD EXAM: ABDOMEN - 1 VIEW COMPARISON:  None FINDINGS: Multiple dilated small bowel loops up to 6.3 cm diameter with paucity of colonic gas highly suspicious for small bowel obstruction. No definite bowel wall thickening. Diffuse osseous demineralization. No urinary tract calcification. IMPRESSION: Multiple dilated loops of small bowel with paucity of colonic gas highly suspicious for small bowel obstruction. These results will be called to the  ordering clinician or representative by the Radiologist Assistant, and communication documented in the PACS or zVision Dashboard. Electronically Signed   By: Lavonia Dana M.D.   On: 12/30/2016 14:13    Procedures Procedures (including critical care time)  Medications Ordered in ED Medications  ondansetron [facility]) injection 4 mg (4 mg Intravenous Given 12/30/16 2149)  iopamidol (ISOVUE-300) 61 % injection (  Contrast Given 12/30/16 2150)  sodium chloride 0.9 % bolus 1,000 mL (1,000 mLs Intravenous New Bag/Given 12/31/16 0012)     Initial Impression / Assessment and Plan / ED Course  I have reviewed the triage vital signs and the nursing notes.  Pertinent labs & imaging results that were available  during my care of the patient were reviewed by me and considered in my medical decision making (see chart for details).      Patient presents from his primary care doctor over concern for bowel obstruction on x-ray. CT performed which confirms findings of likely low-grade bowel obstruction. Patient also with mild acute kidney injury. IV hydration initiated. NG tube ordered for management. Will admit to [facility] for inpatient care.  Vitals:   12/30/16 2315 12/30/16 2330 12/30/16 2345 12/31/16 0000  BP: (!) 130/57 139/64 137/66 (!) 124/55  Pulse: (!) 56 62 60   Resp: (!) 21 19 20    Temp:      TempSrc:      SpO2: 95% 94% 94%   Weight:        Final Clinical Impressions(s) / ED Diagnoses   Final diagnoses:  SBO (small bowel obstruction) (HCC)  AKI (acute kidney injury) [facility])    New Prescriptions New Prescriptions   No medications on file     Antonietta Breach, Hershal Coria 12/31/16 0115    Duffy Bruce, MD 01/01/17 1547

## 2016-12-30 NOTE — Progress Notes (Signed)
Careteam: Patient Care Team: Gayland Curry, DO as PCP - General (Geriatric Medicine) Zebedee Iba., MD as Referring Physician (Ophthalmology) Chesley Mires, MD as Consulting Physician (Pulmonary Disease)  Advanced Directive information Does Patient Have a Medical Advance Directive?: Yes, Type of Advance Directive: [place];Living will  Allergies  Allergen Reactions  . Ace Inhibitors Cough    Chief Complaint  Patient presents with  . Acute Visit    Pt is being seen due to stomach pain/swelling x 1 day. Pt reports some nausea, 1 episode of vomiting, and 1 episode of loose stool.   . Other    wife in room     HPI: Patient is a 81 y.o. male seen in the office today due to abdominal discomfort for 1 day. Pt with hx HTN, DM, Depression, COPD.  Pt report he was abdominal pain last night but it is better now.  Reports pain in mid-stomach. Pain is 6/10, cramping. Did not radiate.  Felt bloated yesterday but this is better today.   Last night it was round and tight, softer today.  Had increase in gas and diarrhea (liquid and formed) twice.  Pt with nausea, 1 episode of vomiting. Felt better after he vomited. No fevers, chill or body aches.  No sick contacts.  States that he thinks he had some food poising.  Ate pimento cheese, humus, candy, sausage biscuit (started feeling uneasy after that).  States sugar went up to 205 this morning.  No hx of pancreatitis or GERD.    Review of Systems:  Review of Systems  Constitutional: Negative for chills, fever and malaise/fatigue.  HENT: Negative for congestion and hearing loss.   Eyes: Negative for blurred vision.       Glasses  Respiratory: Negative for shortness of breath.   Cardiovascular: Negative for chest pain, palpitations and leg swelling.  Gastrointestinal: Positive for abdominal pain, diarrhea, nausea and vomiting. Negative for blood in stool, constipation, heartburn and melena.  Genitourinary:  Negative for dysuria.  Musculoskeletal: Negative for falls and joint pain.  Skin: Negative for itching and rash.  Neurological: Negative for dizziness, loss of consciousness and weakness.  Psychiatric/Behavioral: Positive for memory loss. Negative for depression. The patient is not nervous/anxious and does not have insomnia.     Past Medical History:  Diagnosis Date  . Allergy   . Alzheimer's disease   . Anemia, unspecified   . Anxiety   . Chronic maxillary sinusitis   . COPD (chronic obstructive pulmonary disease) ([place])   . Depression   . Diabetes mellitus   . Edema   . Hypercalcemia   . Hyperlipidemia   . Hypertension   . Hypertrophy of prostate without urinary obstruction and other lower urinary tract symptoms (LUTS)   . Hypertrophy of prostate without urinary obstruction and other lower urinary tract symptoms (LUTS)   . Kidney disease    mild  . Unspecified disorder of kidney and ureter   . Unspecified sleep apnea   . Ventral hernia, unspecified, without mention of obstruction or gangrene    Past Surgical History:  Procedure Laterality Date  . EXPLORATORY LAPAROTOMY  2002  . HERNIA REPAIR     Social History:   reports that he quit smoking about 28 years ago. He quit after 30.00 years of use. He has never used smokeless tobacco. He reports that he does not drink alcohol or use drugs.  Family History  Problem Relation Age of Onset  . Pancreatic cancer Sister   .  Heart disease Father   . Hypertension Brother   . Hypertension Sister     Medications: Patient's Medications  New Prescriptions   No medications on file  Previous Medications   ALBUTEROL (PROVENTIL HFA;VENTOLIN HFA) 108 (90 BASE) MCG/ACT INHALER    Inhale 2 puffs into the lungs every 6 (six) hours as needed for wheezing or shortness of breath.   AMLODIPINE (NORVASC) 10 MG TABLET    Take 1 tablet (10 mg total) by mouth daily.   ASPIRIN EC 81 MG TABLET    Take 1 tablet (81 mg total) by mouth daily.    ATORVASTATIN (LIPITOR) 20 MG TABLET    Take one tablet by mouth once daily for cholesterol   BLOOD GLUCOSE MONITORING SUPPL (ONE TOUCH ULTRA 2) W/DEVICE KIT    Test blood sugar three times daily DX E11.9   GLUCOSE BLOOD (ONE TOUCH ULTRA TEST) TEST STRIP    Use as instructed to test blood sugar once daily dx E119   HYDRALAZINE (APRESOLINE) 25 MG TABLET    Take 1 tablet (25 mg total) by mouth 2 (two) times daily.   IPRATROPIUM-ALBUTEROL (DUONEB) 0.5-2.5 (3) MG/3ML SOLN    Take 3 mLs by nebulization every 6 (six) hours as needed (wheezing).   LANCETS (ONETOUCH ULTRASOFT) LANCETS    Use to test blood sugar up to three times daily. Dx E11.9   LINAGLIPTIN (TRADJENTA) 5 MG TABS TABLET    TAKE 1 TABLET ONE TIME DAILY TO CONTROL BLOOD SUGAR   LOSARTAN (COZAAR) 50 MG TABLET    Take 1 tablet (50 mg total) by mouth daily.   METOPROLOL (LOPRESSOR) 50 MG TABLET    Take 1 tablet (50 mg total) by mouth 2 (two) times daily.   MONTELUKAST (SINGULAIR) 10 MG TABLET    Take 1 tablet (10 mg total) by mouth at bedtime.   PAROXETINE (PAXIL) 20 MG TABLET    Take 1 tablet by mouth once daily for depression   TRAZODONE (DESYREL) 50 MG TABLET    Take 1 tablet by mouth at bedtime as needed for sleep  Modified Medications   No medications on file  Discontinued Medications   No medications on file     Physical Exam:  Vitals:   12/30/16 1115  BP: 108/64  Pulse: 73  Resp: 17  Temp: 97.4 F (36.3 C)  TempSrc: Oral  SpO2: 96%  Weight: 187 lb (84.8 kg)  Height: 5' 7"  (1.702 m)   Body mass index is 29.29 kg/m.  Physical Exam  Constitutional: He is oriented to person, place, and time. He appears well-developed and well-nourished. No distress.  HENT:  Head: Normocephalic and atraumatic.  Right ear with cerumen impaction and increased cerumen in left as well  Cardiovascular: Normal rate, regular rhythm and normal heart sounds.   Pulmonary/Chest: Effort normal and breath sounds normal. No respiratory distress.    Abdominal: Soft. Bowel sounds are normal. There is tenderness. A hernia (from exp lap scar at midline) is present.  Midline tenderness, hx of exploratory lap. Area soft but tender on exam.   Musculoskeletal: Normal range of motion.  Neurological: He is alert and oriented to person, place, and time.  Some short term memory loss  Skin: Skin is warm and dry.  Psychiatric: He has a normal mood and affect.    Labs reviewed: Basic Metabolic Panel:  Recent Labs  02/23/16 1031 03/25/16 06/03/16 0950 10/30/16 0844  NA 141 136* 143 142  K 4.4 4.4 4.6 4.5  CL 107  --  108 108  CO2 27  --  28 26  GLUCOSE 85  --  123* 118*  BUN 30* 29* 27* 36*  CREATININE 1.62* 1.6* 1.59* 1.70*  CALCIUM 9.7  --  9.9 9.7   Liver Function Tests:  Recent Labs  06/03/16 0950  AST 17  ALT 13  ALKPHOS 53  BILITOT 0.4  PROT 6.8  ALBUMIN 3.8   No results for input(s): LIPASE, AMYLASE in the last 8760 hours. No results for input(s): AMMONIA in the last 8760 hours. CBC:  Recent Labs  02/23/16 1031 03/25/16  WBC 7.5 7.3  NEUTROABS 3,675  --   HGB 13.1* 12.8*  HCT 39.8 37*  MCV 95.0  --   PLT 154 134*   Lipid Panel:  Recent Labs  02/23/16 1031 06/03/16 0950 10/30/16 0844  CHOL 172 170 154  HDL 47 48 39*  LDLCALC 96 99 94  TRIG 144 114 103  CHOLHDL 3.7 3.5 3.9   TSH: No results for input(s): TSH in the last 8760 hours. A1C: Lab Results  Component Value Date   HGBA1C 5.7 (H) 10/30/2016     Assessment/Plan 1. Abdominal pain, unspecified abdominal location with Nausea vomiting and diarrhea -although symptoms have improved still having abdominal discomfort. VSS, pt looks well in no distress at time of visit.  Could have been related to something he ate or viral.  - will get DG Abd 1 View r/o obstruction  - CBC with Differential/Platelets - CMP with eGFR - Amylase - Lipase -to increase hydration -bland diet advance as tolerates.   -discussed when to seek emergency treatment.   Carlos American. Harle Battiest  [facility] [phone] 8 am - 5 pm) [phone] (after hours)

## 2016-12-30 NOTE — ED Notes (Signed)
Oxygen applied to pt. Pt was sleeping and 84% on RA.  Pt now on 3L and oxygen saturation now 95%.

## 2016-12-30 NOTE — ED Triage Notes (Signed)
Per pt he is having generalized abdominal pain that started yesterday afternoon. Pt went to PCP today and sent him for x-ray, saw a possible small bowel obstruction. Pt has had vomiting and diarrhea, last episode of each was early this morning. Pt has still being peeing. Pt states that "nothing helps".

## 2016-12-31 ENCOUNTER — Emergency Department (HOSPITAL_COMMUNITY): Payer: PPO

## 2016-12-31 DIAGNOSIS — F0280 Dementia in other diseases classified elsewhere without behavioral disturbance: Secondary | ICD-10-CM | POA: Diagnosis present

## 2016-12-31 DIAGNOSIS — G300 Alzheimer's disease with early onset: Secondary | ICD-10-CM

## 2016-12-31 DIAGNOSIS — J449 Chronic obstructive pulmonary disease, unspecified: Secondary | ICD-10-CM | POA: Diagnosis present

## 2016-12-31 DIAGNOSIS — K56609 Unspecified intestinal obstruction, unspecified as to partial versus complete obstruction: Secondary | ICD-10-CM | POA: Diagnosis present

## 2016-12-31 DIAGNOSIS — Z7984 Long term (current) use of oral hypoglycemic drugs: Secondary | ICD-10-CM | POA: Diagnosis not present

## 2016-12-31 DIAGNOSIS — N179 Acute kidney failure, unspecified: Secondary | ICD-10-CM

## 2016-12-31 DIAGNOSIS — Z87891 Personal history of nicotine dependence: Secondary | ICD-10-CM | POA: Diagnosis not present

## 2016-12-31 DIAGNOSIS — Z79899 Other long term (current) drug therapy: Secondary | ICD-10-CM | POA: Diagnosis not present

## 2016-12-31 DIAGNOSIS — R0902 Hypoxemia: Secondary | ICD-10-CM | POA: Diagnosis present

## 2016-12-31 DIAGNOSIS — E785 Hyperlipidemia, unspecified: Secondary | ICD-10-CM | POA: Diagnosis present

## 2016-12-31 DIAGNOSIS — Z66 Do not resuscitate: Secondary | ICD-10-CM | POA: Diagnosis present

## 2016-12-31 DIAGNOSIS — Z7982 Long term (current) use of aspirin: Secondary | ICD-10-CM | POA: Diagnosis not present

## 2016-12-31 DIAGNOSIS — N183 Chronic kidney disease, stage 3 (moderate): Secondary | ICD-10-CM | POA: Diagnosis present

## 2016-12-31 DIAGNOSIS — K56600 Partial intestinal obstruction, unspecified as to cause: Secondary | ICD-10-CM | POA: Diagnosis present

## 2016-12-31 DIAGNOSIS — F419 Anxiety disorder, unspecified: Secondary | ICD-10-CM | POA: Diagnosis present

## 2016-12-31 DIAGNOSIS — R140 Abdominal distension (gaseous): Secondary | ICD-10-CM | POA: Diagnosis not present

## 2016-12-31 DIAGNOSIS — D72829 Elevated white blood cell count, unspecified: Secondary | ICD-10-CM | POA: Diagnosis present

## 2016-12-31 DIAGNOSIS — Z888 Allergy status to other drugs, medicaments and biological substances status: Secondary | ICD-10-CM | POA: Diagnosis not present

## 2016-12-31 DIAGNOSIS — E1122 Type 2 diabetes mellitus with diabetic chronic kidney disease: Secondary | ICD-10-CM | POA: Diagnosis present

## 2016-12-31 DIAGNOSIS — I129 Hypertensive chronic kidney disease with stage 1 through stage 4 chronic kidney disease, or unspecified chronic kidney disease: Secondary | ICD-10-CM | POA: Diagnosis present

## 2016-12-31 DIAGNOSIS — F329 Major depressive disorder, single episode, unspecified: Secondary | ICD-10-CM | POA: Diagnosis present

## 2016-12-31 HISTORY — DX: Unspecified intestinal obstruction, unspecified as to partial versus complete obstruction: K56.609

## 2016-12-31 LAB — GLUCOSE, CAPILLARY
GLUCOSE-CAPILLARY: 154 mg/dL — AB (ref 65–99)
GLUCOSE-CAPILLARY: 111 mg/dL — AB (ref 65–99)
GLUCOSE-CAPILLARY: 103 mg/dL — AB (ref 65–99)
GLUCOSE-CAPILLARY: 116 mg/dL — AB (ref 65–99)
Glucose-Capillary: 101 mg/dL — ABNORMAL HIGH (ref 65–99)

## 2016-12-31 LAB — BASIC METABOLIC PANEL
Anion gap: 6 (ref 5–15)
BUN: 36 mg/dL — ABNORMAL HIGH (ref 6–20)
CHLORIDE: 110 mmol/L (ref 101–111)
CO2: 25 mmol/L (ref 22–32)
Calcium: 9 mg/dL (ref 8.9–10.3)
Creatinine, Ser: 2.01 mg/dL — ABNORMAL HIGH (ref 0.61–1.24)
GFR calc non Af Amer: 30 mL/min — ABNORMAL LOW (ref >60)
GFR, EST AFRICAN AMERICAN: 35 mL/min — AB (ref >60)
Glucose, Bld: 125 mg/dL — ABNORMAL HIGH (ref 65–99)
POTASSIUM: 3.8 mmol/L (ref 3.5–5.1)
SODIUM: 141 mmol/L (ref 135–145)

## 2016-12-31 LAB — AMYLASE: AMYLASE: 46 U/L (ref 21–101)

## 2016-12-31 LAB — LIPASE: Lipase: 24 U/L (ref 7–60)

## 2016-12-31 MED ORDER — ENOXAPARIN SODIUM 30 MG/0.3ML ~~LOC~~ SOLN
30.0000 mg | Freq: Every day | SUBCUTANEOUS | Status: DC
  Administered 2016-12-31 – 2017-01-01 (×2): 30 mg via SUBCUTANEOUS
  Filled 2016-12-31 (×2): qty 0.3

## 2016-12-31 MED ORDER — INSULIN ASPART 100 UNIT/ML ~~LOC~~ SOLN
0.0000 [IU] | SUBCUTANEOUS | Status: DC
  Administered 2016-12-31: 2 [IU] via SUBCUTANEOUS
  Administered 2017-01-02 – 2017-01-05 (×10): 1 [IU] via SUBCUTANEOUS

## 2016-12-31 MED ORDER — ALBUTEROL SULFATE (2.5 MG/3ML) 0.083% IN NEBU: 2.5000 mg | INHALATION_SOLUTION | Freq: Four times a day (QID) | RESPIRATORY_TRACT | Status: DC | PRN

## 2016-12-31 MED ORDER — ONDANSETRON HCL 4 MG/2ML IJ SOLN: 4.0000 mg | Freq: Four times a day (QID) | INTRAMUSCULAR | Status: DC | PRN

## 2016-12-31 MED ORDER — ONDANSETRON HCL 4 MG PO TABS: 4.0000 mg | ORAL_TABLET | Freq: Four times a day (QID) | ORAL | Status: DC | PRN

## 2016-12-31 MED ORDER — AMLODIPINE BESYLATE 5 MG PO TABS
10.0000 mg | ORAL_TABLET | Freq: Every day | ORAL | Status: DC
  Administered 2016-12-31 – 2017-01-01 (×2): 10 mg via ORAL
  Filled 2016-12-31 (×2): qty 2

## 2016-12-31 MED ORDER — IPRATROPIUM-ALBUTEROL 0.5-2.5 (3) MG/3ML IN SOLN: 3.0000 mL | Freq: Four times a day (QID) | RESPIRATORY_TRACT | Status: DC | PRN

## 2016-12-31 MED ORDER — ATORVASTATIN CALCIUM 20 MG PO TABS
20.0000 mg | ORAL_TABLET | Freq: Every day | ORAL | Status: DC
  Administered 2016-12-31 – 2017-01-05 (×4): 20 mg via ORAL
  Filled 2016-12-31 (×5): qty 1

## 2016-12-31 MED ORDER — PAROXETINE HCL 20 MG PO TABS
20.0000 mg | ORAL_TABLET | Freq: Every day | ORAL | Status: DC
  Administered 2016-12-31 – 2017-01-06 (×6): 20 mg via ORAL
  Filled 2016-12-31 (×6): qty 1

## 2016-12-31 MED ORDER — TRAZODONE HCL 50 MG PO TABS: 50.0000 mg | ORAL_TABLET | Freq: Every evening | ORAL | Status: DC | PRN

## 2016-12-31 MED ORDER — ACETAMINOPHEN 325 MG PO TABS
650.0000 mg | ORAL_TABLET | Freq: Four times a day (QID) | ORAL | Status: DC | PRN
  Administered 2016-12-31 – 2017-01-04 (×3): 650 mg via ORAL
  Filled 2016-12-31 (×4): qty 2

## 2016-12-31 MED ORDER — ASPIRIN EC 81 MG PO TBEC
81.0000 mg | DELAYED_RELEASE_TABLET | Freq: Every day | ORAL | Status: DC
  Administered 2016-12-31 – 2017-01-06 (×6): 81 mg via ORAL
  Filled 2016-12-31 (×6): qty 1

## 2016-12-31 MED ORDER — ACETAMINOPHEN 650 MG RE SUPP
650.0000 mg | Freq: Four times a day (QID) | RECTAL | Status: DC | PRN
  Filled 2016-12-31: qty 1

## 2016-12-31 MED ORDER — HYDRALAZINE HCL 25 MG PO TABS
25.0000 mg | ORAL_TABLET | Freq: Two times a day (BID) | ORAL | Status: DC
  Administered 2016-12-31 – 2017-01-01 (×4): 25 mg via ORAL
  Filled 2016-12-31 (×4): qty 1

## 2016-12-31 MED ORDER — SODIUM CHLORIDE 0.9 % IV SOLN
INTRAVENOUS | Status: AC
  Administered 2016-12-31 – 2017-01-01 (×3): via INTRAVENOUS

## 2016-12-31 MED ORDER — METOPROLOL TARTRATE 25 MG PO TABS
50.0000 mg | ORAL_TABLET | Freq: Two times a day (BID) | ORAL | Status: DC
  Administered 2016-12-31 – 2017-01-01 (×4): 50 mg via ORAL
  Filled 2016-12-31 (×4): qty 2

## 2016-12-31 MED ORDER — MONTELUKAST SODIUM 10 MG PO TABS: 10.0000 mg | ORAL_TABLET | Freq: Every day | ORAL | Status: DC | PRN

## 2016-12-31 NOTE — Progress Notes (Signed)
12/30/2016  9:28 PM  12/31/2016 10:50 AM  Harlen Labs was seen and examined.  The H&P by the admitting provider, orders, imaging was reviewed.  He feels some better but no BM.  He does have good bowel sounds.  His abdomen still is distended.  Will check back later and possibly try clear liquids for supper.   Please see new orders.  Will continue to follow.   Murvin Natal, MD [facility]

## 2016-12-31 NOTE — H&P (Signed)
History and Physical    Ian [name] WIO:[id number] DOB: May 07, 1936 DOA: 12/30/2016  PCP: Gayland Curry, DO  Patient coming from: Home  I have personally briefly reviewed patient's old medical records in [place]  Chief Complaint: SBO  HPI: Ian [name] is a 81 y.o. male with medical history significant of DM2, HTN, CKD, COPD, alzheimer's.  Patient presents to the ED at instruction of PCP due to concern for SBO.  Patient developed generalized abdominal discomfort yesterday, had 1 episode of small amount of emesis today and associated decreased bowel movements.  Symptoms fairly constant since onset.  Patient has history of ex-lap in 02 and hernia repair thereafter.  ED Course: CT scan confirms likely low grade SBO / PSBO.  NGT attempted but unsuccessful.   Review of Systems: As per HPI otherwise 10 point review of systems negative.   Past Medical History:  Diagnosis Date  . Allergy   . Alzheimer's disease   . Anemia, unspecified   . Anxiety   . Chronic maxillary sinusitis   . COPD (chronic obstructive pulmonary disease) ([place])   . Depression   . Diabetes mellitus   . Edema   . Hypercalcemia   . Hyperlipidemia   . Hypertension   . Hypertrophy of prostate without urinary obstruction and other lower urinary tract symptoms (LUTS)   . Hypertrophy of prostate without urinary obstruction and other lower urinary tract symptoms (LUTS)   . Kidney disease    mild  . Unspecified disorder of kidney and ureter   . Unspecified sleep apnea   . Ventral hernia, unspecified, without mention of obstruction or gangrene     Past Surgical History:  Procedure Laterality Date  . EXPLORATORY LAPAROTOMY  2002  . HERNIA REPAIR       reports that he quit smoking about 28 years ago. He quit after 30.00 years of use. He has never used smokeless tobacco. He reports that he does not drink alcohol or use drugs.  Allergies  Allergen Reactions  . Ace Inhibitors Cough    Family History    Problem Relation Age of Onset  . Pancreatic cancer Sister   . Heart disease Father   . Hypertension Brother   . Hypertension Sister      Prior to Admission medications   Medication Sig Start Date End Date Taking? Authorizing Provider  albuterol (PROVENTIL HFA;VENTOLIN HFA) 108 (90 Base) MCG/ACT inhaler Inhale 2 puffs into the lungs every 6 (six) hours as needed for wheezing or shortness of breath. 10/04/15  Yes [name], [name] L, DO  amLODipine (NORVASC) 10 MG tablet Take 1 tablet (10 mg total) by mouth daily. 08/15/16  Yes [name], [name] L, DO  aspirin EC 81 MG tablet Take 1 tablet (81 mg total) by mouth daily. 10/06/14  Yes [name], [name] L, DO  atorvastatin (LIPITOR) 20 MG tablet Take one tablet by mouth once daily for cholesterol 07/23/16  Yes [name], [name] L, DO  Blood Glucose Monitoring Suppl (ONE TOUCH ULTRA 2) w/Device KIT Test blood sugar three times daily DX E11.9 10/10/16  Yes [name], [name] L, DO  glucose blood (ONE TOUCH ULTRA TEST) test strip Use as instructed to test blood sugar once daily dx E119 09/20/16  Yes [name], [name] L, DO  hydrALAZINE (APRESOLINE) 25 MG tablet Take 1 tablet (25 mg total) by mouth 2 (two) times daily. 08/15/16  Yes [name], [name] L, DO  ipratropium-albuterol (DUONEB) 0.5-2.5 (3) MG/3ML SOLN Take 3 mLs by nebulization every 6 (six) hours as needed (  wheezing). 04/17/15  Yes [name], [name] L, DO  Lancets (ONETOUCH ULTRASOFT) lancets Use to test blood sugar up to three times daily. Dx E11.9 08/05/16  Yes [name], [name] L, DO  linagliptin (TRADJENTA) 5 MG TABS tablet TAKE 1 TABLET ONE TIME DAILY TO CONTROL BLOOD SUGAR 07/31/16  Yes Estill Dooms, MD  losartan (COZAAR) 50 MG tablet Take 1 tablet (50 mg total) by mouth daily. 09/20/16  Yes [name], [name] L, DO  metoprolol (LOPRESSOR) 50 MG tablet Take 1 tablet (50 mg total) by mouth 2 (two) times daily. 07/05/16  Yes [name], [name] L, DO  montelukast (SINGULAIR) 10 MG tablet Take 1 tablet (10 mg total) by mouth at  bedtime. Patient taking differently: Take 10 mg by mouth daily as needed (allergies).  10/01/16  Yes [name], [name] L, DO  PARoxetine (PAXIL) 20 MG tablet Take 1 tablet by mouth once daily for depression 12/03/16  Yes [name], [name] L, DO  traZODone (DESYREL) 50 MG tablet Take 1 tablet by mouth at bedtime as needed for sleep 07/05/16  Yes Gayland Curry, DO    Physical Exam: Vitals:   12/30/16 2345 12/31/16 0000 12/31/16 0030 12/31/16 0100  BP: 137/66 (!) 124/55 (!) 143/51 (!) 131/54  Pulse: 60  62 66  Resp: 20  (!) 23 (!) 23  Temp:      TempSrc:      SpO2: 94%  94% 93%  Weight:        Constitutional: NAD, calm, comfortable Eyes: PERRL, lids and conjunctivae normal ENMT: Mucous membranes are moist. Posterior pharynx clear of any exudate or lesions.Normal dentition.  Neck: normal, supple, no masses, no thyromegaly Respiratory: clear to auscultation bilaterally, no wheezing, no crackles. Normal respiratory effort. No accessory muscle use.  Cardiovascular: Regular rate and rhythm, no murmurs / rubs / gallops. No extremity edema. 2+ pedal pulses. No carotid bruits.  Abdomen: mild diffuse TTP Musculoskeletal: no clubbing / cyanosis. No joint deformity upper and lower extremities. Good ROM, no contractures. Normal muscle tone.  Skin: no rashes, lesions, ulcers. No induration Neurologic: CN 2-12 grossly intact. Sensation intact, DTR normal. Strength 5/5 in all 4.  Psychiatric: Normal judgment and insight. Alert and oriented x 3. Normal mood.    Labs on Admission: I have personally reviewed following labs and imaging studies  CBC:  Recent Labs Lab 12/30/16 1143 12/30/16 1807  WBC 11.2* 11.3*  NEUTROABS 8,736*  --   HGB 13.7 13.7  HCT 41.9 41.3  MCV 96.8 96.5  PLT 186 621   Basic Metabolic Panel:  Recent Labs Lab 12/30/16 1143 12/30/16 1807  NA 142 141  K 4.3 4.4  CL 107 108  CO2 24 26  GLUCOSE 155* 132*  BUN 42* 42*  CREATININE 2.11* 2.14*  CALCIUM 10.2 10.2    GFR: Estimated Creatinine Clearance: 29.9 mL/min (A) (by C-G formula based on SCr of 2.14 mg/dL (H)). Liver Function Tests:  Recent Labs Lab 12/30/16 1143 12/30/16 1807  AST 15 17  ALT 10 14*  ALKPHOS 53 49  BILITOT 0.5 0.4  PROT 6.5 6.9  ALBUMIN 3.8 3.8    Recent Labs Lab 12/30/16 1807  LIPASE 28   No results for input(s): AMMONIA in the last 168 hours. Coagulation Profile: No results for input(s): INR, PROTIME in the last 168 hours. Cardiac Enzymes: No results for input(s): CKTOTAL, CKMB, CKMBINDEX, TROPONINI in the last 168 hours. BNP (last 3 results) No results for input(s): PROBNP in the last 8760 hours. HbA1C: No results for  input(s): HGBA1C in the last 72 hours. CBG: No results for input(s): GLUCAP in the last 168 hours. Lipid Profile: No results for input(s): CHOL, HDL, LDLCALC, TRIG, CHOLHDL, LDLDIRECT in the last 72 hours. Thyroid Function Tests: No results for input(s): TSH, T4TOTAL, FREET4, T3FREE, THYROIDAB in the last 72 hours. Anemia Panel: No results for input(s): VITAMINB12, FOLATE, FERRITIN, TIBC, IRON, RETICCTPCT in the last 72 hours. Urine analysis:    Component Value Date/Time   COLORURINE YELLOW 12/29/2012 [place] 12/29/2012 1650   LABSPEC 1.025 12/29/2012 1650   PHURINE 5.5 12/29/2012 1650   GLUCOSEU NEGATIVE 12/29/2012 1650   HGBUR MODERATE (A) 12/29/2012 1650   BILIRUBINUR SMALL (A) 12/29/2012 1650   KETONESUR NEGATIVE 12/29/2012 1650   PROTEINUR >300 (A) 12/29/2012 1650   UROBILINOGEN 1.0 12/29/2012 1650   NITRITE NEGATIVE 12/29/2012 1650   LEUKOCYTESUR NEGATIVE 12/29/2012 1650    Radiological Exams on Admission: Ct Abdomen Pelvis Wo Contrast  Result Date: 12/31/2016 CLINICAL DATA:  Abdominal pain. EXAM: CT ABDOMEN AND PELVIS WITHOUT CONTRAST TECHNIQUE: Multidetector CT imaging of the abdomen and pelvis was performed following the standard protocol without IV contrast. COMPARISON:  Radiographs 12/30/2016  FINDINGS: Lower chest: No acute abnormality. Hepatobiliary: Several small calculi are present in the gallbladder lumen. No bile duct dilatation. No focal liver lesions. Pancreas: Unremarkable. No pancreatic ductal dilatation or surrounding inflammatory changes. Spleen: Normal in size without focal abnormality. Adrenals/Urinary Tract: Adrenal glands are unremarkable. Kidneys are normal, without renal calculi, focal lesion, or hydronephrosis. Bladder is unremarkable. Stomach/Bowel: Moderate dilatation of stomach and small bowel. Mild small bowel mural thickening and edema. Distal small bowel is relatively decompressed but an abrupt focal caliber transition is not evident. No focal inflammation of bowel. No mass. No pneumatosis. No extraluminal air. Colon is normal. Vascular/Lymphatic: Moderate atherosclerotic calcification. No aneurysm. Nonspecific nodes in the inguinal regions, more likely benign. Reproductive: Unremarkable Other: No ascites. Musculoskeletal: No significant skeletal lesion. IMPRESSION: 1. Moderate dilatation of small bowel in its proximal and midportions, probably a low-grade obstruction. No mass or hernia is evident. No bowel perforation. 2. Cholelithiasis. 3. Aortic atherosclerosis. Electronically Signed   By: Andreas Newport M.D.   On: 12/31/2016 00:32   Dg Abd 1 View  Result Date: 12/30/2016 CLINICAL DATA:  Nausea, vomiting, and diarrhea for 1 week, history diabetes mellitus, hypertension, COPD EXAM: ABDOMEN - 1 VIEW COMPARISON:  None FINDINGS: Multiple dilated small bowel loops up to 6.3 cm diameter with paucity of colonic gas highly suspicious for small bowel obstruction. No definite bowel wall thickening. Diffuse osseous demineralization. No urinary tract calcification. IMPRESSION: Multiple dilated loops of small bowel with paucity of colonic gas highly suspicious for small bowel obstruction. These results will be called to the ordering clinician or representative by the Radiologist  Assistant, and communication documented in the PACS or zVision Dashboard. Electronically Signed   By: Lavonia Dana M.D.   On: 12/30/2016 14:13    EKG: Independently reviewed.  Assessment/Plan Principal Problem:   SBO (small bowel obstruction) (HCC) Active Problems:   COPD with asthma ([place])   Diabetes mellitus ([place])   Chronic kidney disease (CKD) stage G3a/A2, moderately decreased glomerular filtration rate (GFR) between 45-59 mL/min/1.73 square meter and albuminuria creatinine ratio between 30-299 mg/g   Early onset Alzheimer's dementia without behavioral disturbance    1. Low grade SBO / PSBO - 1. NPO except sips with meds 2. IVF 3. NGT attempted but unsuccessful for now 4. Re-attempt NGT if emesis or abdominal pain worsens  significantly 5. Zofran PRN nausea 2. DM2 - hold home linagliptin, use sensitive scale SSI q4h 3. CKD stage 3 - 1. Creat slightly up from baseline it appears 2. IVF 3. Repeat BMP in AM 4. Alzheimer's - continue home meds 5. HTN - continue amlodipine, hold losartan due to elevation in creatinine 6. COPD - 1. continue home nebs 2. Hypoxic on RA monitor to mid 80s in ED, per wife this is baseline though and patient has no respiratory complaints at all. 3. May qualify for home O2  DVT prophylaxis: Lovenox Code Status: DNR Family Communication: Wife at bedside Disposition Plan: Home after admit Consults called: None Admission status: Admit to inpatient   [place] Pager [phone]  If 7AM-7PM, please contact day team taking care of patient [email or web address] Password [email or web address]  12/31/2016, 1:51 AM

## 2016-12-31 NOTE — ED Notes (Signed)
Admitting at bedside 

## 2016-12-31 NOTE — ED Notes (Signed)
Attempted NG tube x 2, resistance, admitting aware, hold off on tube for now unless vomiting is uncontrolled.

## 2017-01-01 LAB — COMPREHENSIVE METABOLIC PANEL
ALK PHOS: 37 U/L — AB (ref 38–126)
ALT: 10 U/L — ABNORMAL LOW (ref 17–63)
AST: 12 U/L — AB (ref 15–41)
Albumin: 2.7 g/dL — ABNORMAL LOW (ref 3.5–5.0)
Anion gap: 4 — ABNORMAL LOW (ref 5–15)
BUN: 29 mg/dL — AB (ref 6–20)
CALCIUM: 8.8 mg/dL — AB (ref 8.9–10.3)
CO2: 23 mmol/L (ref 22–32)
CREATININE: 1.79 mg/dL — AB (ref 0.61–1.24)
Chloride: 114 mmol/L — ABNORMAL HIGH (ref 101–111)
GFR, EST AFRICAN AMERICAN: 40 mL/min — AB (ref >60)
GFR, EST NON AFRICAN AMERICAN: 35 mL/min — AB (ref >60)
Glucose, Bld: 107 mg/dL — ABNORMAL HIGH (ref 65–99)
Potassium: 4 mmol/L (ref 3.5–5.1)
SODIUM: 141 mmol/L (ref 135–145)
Total Bilirubin: 0.6 mg/dL (ref 0.3–1.2)
Total Protein: 5.1 g/dL — ABNORMAL LOW (ref 6.5–8.1)

## 2017-01-01 LAB — CBC
HCT: 36.4 % — ABNORMAL LOW (ref 39.0–52.0)
Hemoglobin: 11.4 g/dL — ABNORMAL LOW (ref 13.0–17.0)
MCH: 30.8 pg (ref 26.0–34.0)
MCHC: 31.3 g/dL (ref 30.0–36.0)
MCV: 98.4 fL (ref 78.0–100.0)
PLATELETS: 135 10*3/uL — AB (ref 150–400)
RBC: 3.7 MIL/uL — AB (ref 4.22–5.81)
RDW: 14.3 % (ref 11.5–15.5)
WBC: 8.3 10*3/uL (ref 4.0–10.5)

## 2017-01-01 LAB — GLUCOSE, CAPILLARY
GLUCOSE-CAPILLARY: 118 mg/dL — AB (ref 65–99)
Glucose-Capillary: 110 mg/dL — ABNORMAL HIGH (ref 65–99)
Glucose-Capillary: 113 mg/dL — ABNORMAL HIGH (ref 65–99)
Glucose-Capillary: 108 mg/dL — ABNORMAL HIGH (ref 65–99)
Glucose-Capillary: 109 mg/dL — ABNORMAL HIGH (ref 65–99)
Glucose-Capillary: 102 mg/dL — ABNORMAL HIGH (ref 65–99)

## 2017-01-01 MED ORDER — ENOXAPARIN SODIUM 40 MG/0.4ML ~~LOC~~ SOLN
40.0000 mg | Freq: Every day | SUBCUTANEOUS | Status: DC
  Administered 2017-01-02 – 2017-01-06 (×5): 40 mg via SUBCUTANEOUS
  Filled 2017-01-01 (×5): qty 0.4

## 2017-01-01 NOTE — Progress Notes (Signed)
PROGRESS NOTE        PATIENT DETAILS Name: Ian [name] Age: 81 y.o. Sex: male Date of Birth: Aug 20, 1935 Admit Date: 12/30/2016 Admitting Physician Etta Quill, DO HLK:[id number], Rexene Edison, DO  Brief Narrative: Patient is a 81 y.o. male with past medical history of Type II DM, hypertension, Chronic kidney disease, Hyperlipidemia, COPD and alzheimer's. Patient presented to the ED for generalized abdominal discomfort prior to an episode of emesis associated with decreased bowel movements. He has a history of abdominal surgeries in the past including an exploratory laparotomy and hernia repair. CT revealed moderate dilation of small bowel most likely due to a low grade obstruction. He was admitted for further management, an NG tube was attempted but not successfully placed. Patient was feeling better approx. 8 hours after being admitted.  Subjective: Patient laying in bed comfortably. He states he is feeling much better, had a bowel movement this morning and has been walking around. He denies chest pain, shortness of breath at rest, dysuria, abdominal pain, nausea or vomiting. He does have some mild shortness of breath when ambulating.  Assessment/Plan: Low grade small bowel obstruction: Patient denies abdominal pain or vomiting. He had a bowel movement this morning and is hungry. Continue Zofran PRN. Advance to full liquid diet, if tolerating well can advance to soft diet. Abdominal XR ordered for tomorrow morning.  COPD with asthma: Mild dyspnea on exertion when ambulating. Patient on 2L O2. CSW looking into home O2 options. Continue O2 and Duobneb PRN. Check pulse ox with ambulation   Diabetes mellitus: CBG stable, ranging 110-154. Continue SSI.  Chronic kidney disease (CKD) stage G3a/A2: Creatinine improving.Cr at around baseline. Electrolytes stable. Continue to monitor.  Hypertension: BP stabilizing, 140/74 this morning. Continue amlodipine, hydralazine and  metoprolol.   Hyperlipidemia: continue statin and ASA.  DVT Prophylaxis: Prophylactic Lovenox   Code Status: DNR  Family Communication: None at bedside  Disposition Plan: Home tomorrow if tolerating advanced diet.  Antimicrobial agents: Anti-infectives    None      Procedures: None  CONSULTS:  None  Time spent: 25 minutes-Greater than 50% of this time was spent in counseling, explanation of diagnosis, planning of further management, and coordination of care.  MEDICATIONS: Scheduled Meds: . amLODipine  10 mg Oral Daily  . aspirin EC  81 mg Oral Daily  . atorvastatin  20 mg Oral q1800  . enoxaparin (LOVENOX) injection  30 mg Subcutaneous Daily  . hydrALAZINE  25 mg Oral BID  . insulin aspart  0-9 Units Subcutaneous Q4H  . metoprolol tartrate  50 mg Oral BID  . PARoxetine  20 mg Oral Daily   Continuous Infusions: PRN Meds:.acetaminophen **OR** acetaminophen, albuterol, ipratropium-albuterol, montelukast, ondansetron **OR** ondansetron (ZOFRAN) IV, traZODone   PHYSICAL EXAM: Vital signs: Vitals:   12/31/16 0627 12/31/16 1314 12/31/16 2014 01/01/17 0429  BP: (!) 145/56 (!) 134/53 (!) 149/89 140/74  Pulse: 62 61 64 63  Resp: 17 18 17 17   Temp: 98.8 F (37.1 C) 98.3 F (36.8 C) 99.5 F (37.5 C) 98.4 F (36.9 C)  TempSrc: Oral Oral Oral Oral  SpO2: 95% 94% 96% 95%  Weight:      Height:       Filed Weights   12/30/16 1711 12/31/16 0248  Weight: 86.4 kg (190 lb 6.4 oz) 100.2 kg (221 lb)   Body mass index is 34.[place]  kg/m.   General appearance: Awake, alert, not in distress. Non-toxic HEENT: Atraumatic and Normocephalic Resp: CTA bilaterally, no added sounds or use of accessory muscles  CVS: RRR, no murmurs.  GI: Normoactive bowel sounds in all 4 quadrants, Non tender, mild distended without guarding. Tympanic to percussion  Extremities: B/L Lower Ext shows no edema, both legs are warm to touch Neurology:  speech clear, No focal neuro  deficits Psychiatric: Normal judgment and insight. Alert and oriented x 3. Normal mood. Skin: warm and dry  I have personally reviewed following labs and imaging studies  LABORATORY DATA: CBC:  Recent Labs Lab 12/30/16 1143 12/30/16 1807 01/01/17 0426  WBC 11.2* 11.3* 8.3  NEUTROABS 8,736*  --   --   HGB 13.7 13.7 11.4*  HCT 41.9 41.3 36.4*  MCV 96.8 96.5 98.4  PLT 186 163 135*    Basic Metabolic Panel:  Recent Labs Lab 12/30/16 1143 12/30/16 1807 12/31/16 0711 01/01/17 0426  NA 142 141 141 141  K 4.3 4.4 3.8 4.0  CL 107 108 110 114*  CO2 24 26 25 23   GLUCOSE 155* 132* 125* 107*  BUN 42* 42* 36* 29*  CREATININE 2.11* 2.14* 2.01* 1.79*  CALCIUM 10.2 10.2 9.0 8.8*    GFR: Estimated Creatinine Clearance: 38.3 mL/min (A) (by C-G formula based on SCr of 1.79 mg/dL (H)).  Liver Function Tests:  Recent Labs Lab 12/30/16 1143 12/30/16 1807 01/01/17 0426  AST 15 17 12*  ALT 10 14* 10*  ALKPHOS 53 49 37*  BILITOT 0.5 0.4 0.6  PROT 6.5 6.9 5.1*  ALBUMIN 3.8 3.8 2.7*    Recent Labs Lab 12/30/16 1143 12/30/16 1807  LIPASE 24 28  AMYLASE 46  --    No results for input(s): AMMONIA in the last 168 hours.  Coagulation Profile: No results for input(s): INR, PROTIME in the last 168 hours.  Cardiac Enzymes: No results for input(s): CKTOTAL, CKMB, CKMBINDEX, TROPONINI in the last 168 hours.  BNP (last 3 results) No results for input(s): PROBNP in the last 8760 hours.  HbA1C: No results for input(s): HGBA1C in the last 72 hours.  CBG:  Recent Labs Lab 12/31/16 1634 12/31/16 2012 01/01/17 0023 01/01/17 0427 01/01/17 0810  GLUCAP 101* 116* 118* 110* 113*    Lipid Profile: No results for input(s): CHOL, HDL, LDLCALC, TRIG, CHOLHDL, LDLDIRECT in the last 72 hours.  Thyroid Function Tests: No results for input(s): TSH, T4TOTAL, FREET4, T3FREE, THYROIDAB in the last 72 hours.  Anemia Panel: No results for input(s): VITAMINB12, FOLATE, FERRITIN,  TIBC, IRON, RETICCTPCT in the last 72 hours.  Urine analysis:    Component Value Date/Time   COLORURINE YELLOW 12/29/2012 1650   APPEARANCEUR CLEAR 12/29/2012 1650   LABSPEC 1.025 12/29/2012 1650   PHURINE 5.5 12/29/2012 1650   GLUCOSEU NEGATIVE 12/29/2012 1650   HGBUR MODERATE (A) 12/29/2012 1650   BILIRUBINUR SMALL (A) 12/29/2012 1650   KETONESUR NEGATIVE 12/29/2012 1650   PROTEINUR >300 (A) 12/29/2012 1650   UROBILINOGEN 1.0 12/29/2012 1650   NITRITE NEGATIVE 12/29/2012 1650   LEUKOCYTESUR NEGATIVE 12/29/2012 1650    Sepsis Labs: Lactic Acid, Venous    Component Value Date/Time   LATICACIDVEN 0.92 12/30/2016 2222    MICROBIOLOGY: No results found for this or any previous visit (from the past 240 hour(s)).  RADIOLOGY STUDIES/RESULTS: Ct Abdomen Pelvis Wo Contrast  Result Date: 12/31/2016 CLINICAL DATA:  Abdominal pain. EXAM: CT ABDOMEN AND PELVIS WITHOUT CONTRAST TECHNIQUE: Multidetector CT imaging of the abdomen and pelvis  was performed following the standard protocol without IV contrast. COMPARISON:  Radiographs 12/30/2016 FINDINGS: Lower chest: No acute abnormality. Hepatobiliary: Several small calculi are present in the gallbladder lumen. No bile duct dilatation. No focal liver lesions. Pancreas: Unremarkable. No pancreatic ductal dilatation or surrounding inflammatory changes. Spleen: Normal in size without focal abnormality. Adrenals/Urinary Tract: Adrenal glands are unremarkable. Kidneys are normal, without renal calculi, focal lesion, or hydronephrosis. Bladder is unremarkable. Stomach/Bowel: Moderate dilatation of stomach and small bowel. Mild small bowel mural thickening and edema. Distal small bowel is relatively decompressed but an abrupt focal caliber transition is not evident. No focal inflammation of bowel. No mass. No pneumatosis. No extraluminal air. Colon is normal. Vascular/Lymphatic: Moderate atherosclerotic calcification. No aneurysm. Nonspecific nodes in the  inguinal regions, more likely benign. Reproductive: Unremarkable Other: No ascites. Musculoskeletal: No significant skeletal lesion. IMPRESSION: 1. Moderate dilatation of small bowel in its proximal and midportions, probably a low-grade obstruction. No mass or hernia is evident. No bowel perforation. 2. Cholelithiasis. 3. Aortic atherosclerosis. Electronically Signed   By: Andreas Newport M.D.   On: 12/31/2016 00:32   Dg Abd 1 View  Result Date: 12/30/2016 CLINICAL DATA:  Nausea, vomiting, and diarrhea for 1 week, history diabetes mellitus, hypertension, COPD EXAM: ABDOMEN - 1 VIEW COMPARISON:  None FINDINGS: Multiple dilated small bowel loops up to 6.3 cm diameter with paucity of colonic gas highly suspicious for small bowel obstruction. No definite bowel wall thickening. Diffuse osseous demineralization. No urinary tract calcification. IMPRESSION: Multiple dilated loops of small bowel with paucity of colonic gas highly suspicious for small bowel obstruction. These results will be called to the ordering clinician or representative by the Radiologist Assistant, and communication documented in the PACS or zVision Dashboard. Electronically Signed   By: Lavonia Dana M.D.   On: 12/30/2016 14:13     LOS: 1 day   Kathleene Hazel, PA-S  [facility]  Attending MD note  Patient was seen, examined,treatment plan was discussed with the PA-S Kathleene Hazel.  I have personally reviewed the clinical findings, lab, imaging studies and management of this patient in detail. I agree with the documentation, as recorded by the PA-S and changes to above note were in bold green  Patient is 80/y/o M with multiple medical hx including exploratory laparotomy and hernia repair, who is admitted for SBO. Patient report doing well today, had BM's, tolerating diet well. No acute events overnight   On Exam: Gen. exam: Awake, alert, not in any distress Chest: Good air entry bilaterally, no rhonchi or rales CVS:  S1-S2 regular, no murmurs Abdomen: Soft, mild distended, tympanic to percussion, non tenderness  Neurology: Non-focal Skin: No rash or lesions  Impression: Low grade SBO  COPD/Asthma  DM type II CKD stage III  HTN   Plan: Advance diet as tolerated, encourage ambulation  Will obtain Abd xray  Continue nebulizer, check ambulatory pulse ox in AM  Check BMP in AM  Continue to monitor   Rest as above  Chipper Oman, MD    If 7PM-7AM, please contact night-coverage [email or web address] Password [facility] 01/01/2017, 9:55 AM

## 2017-01-02 ENCOUNTER — Inpatient Hospital Stay (HOSPITAL_COMMUNITY): Payer: PPO

## 2017-01-02 LAB — BASIC METABOLIC PANEL
ANION GAP: 6 (ref 5–15)
BUN: 22 mg/dL — ABNORMAL HIGH (ref 6–20)
CHLORIDE: 111 mmol/L (ref 101–111)
CO2: 24 mmol/L (ref 22–32)
Calcium: 9.3 mg/dL (ref 8.9–10.3)
Creatinine, Ser: 1.55 mg/dL — ABNORMAL HIGH (ref 0.61–1.24)
GFR calc Af Amer: 48 mL/min — ABNORMAL LOW (ref >60)
GFR, EST NON AFRICAN AMERICAN: 41 mL/min — AB (ref >60)
Glucose, Bld: 97 mg/dL (ref 65–99)
POTASSIUM: 3.8 mmol/L (ref 3.5–5.1)
SODIUM: 141 mmol/L (ref 135–145)

## 2017-01-02 LAB — GLUCOSE, CAPILLARY
GLUCOSE-CAPILLARY: 102 mg/dL — AB (ref 65–99)
GLUCOSE-CAPILLARY: 114 mg/dL — AB (ref 65–99)
GLUCOSE-CAPILLARY: 144 mg/dL — AB (ref 65–99)
GLUCOSE-CAPILLARY: 87 mg/dL (ref 65–99)
Glucose-Capillary: 104 mg/dL — ABNORMAL HIGH (ref 65–99)
Glucose-Capillary: 105 mg/dL — ABNORMAL HIGH (ref 65–99)

## 2017-01-02 LAB — MAGNESIUM: MAGNESIUM: 1.9 mg/dL (ref 1.7–2.4)

## 2017-01-02 MED ORDER — METOPROLOL TARTRATE 25 MG PO TABS
50.0000 mg | ORAL_TABLET | Freq: Two times a day (BID) | ORAL | Status: DC
  Administered 2017-01-02 – 2017-01-05 (×7): 50 mg via NASOGASTRIC
  Filled 2017-01-02 (×7): qty 2

## 2017-01-02 MED ORDER — DEXTROSE-NACL 5-0.45 % IV SOLN
INTRAVENOUS | Status: DC
  Administered 2017-01-02: 20:00:00 via INTRAVENOUS

## 2017-01-02 MED ORDER — AMLODIPINE BESYLATE 5 MG PO TABS
10.0000 mg | ORAL_TABLET | Freq: Every day | ORAL | Status: DC
  Administered 2017-01-02 – 2017-01-05 (×4): 10 mg via NASOGASTRIC
  Filled 2017-01-02 (×5): qty 2

## 2017-01-02 MED ORDER — DIATRIZOATE MEGLUMINE & SODIUM 66-10 % PO SOLN
90.0000 mL | Freq: Once | ORAL | Status: AC
  Administered 2017-01-02: 90 mL via NASOGASTRIC
  Filled 2017-01-02: qty 90

## 2017-01-02 MED ORDER — HYDRALAZINE HCL 20 MG/ML IJ SOLN: 10.0000 mg | INTRAMUSCULAR | Status: DC | PRN

## 2017-01-02 MED ORDER — HYDRALAZINE HCL 20 MG/ML IJ SOLN
10.0000 mg | INTRAMUSCULAR | Status: DC | PRN
  Administered 2017-01-02 – 2017-01-04 (×4): 10 mg via INTRAVENOUS
  Filled 2017-01-02 (×4): qty 1

## 2017-01-02 MED ORDER — MORPHINE SULFATE (PF) 4 MG/ML IV SOLN
1.0000 mg | INTRAVENOUS | Status: DC | PRN
  Administered 2017-01-02 – 2017-01-04 (×5): 1 mg via INTRAVENOUS
  Filled 2017-01-02 (×5): qty 1

## 2017-01-02 NOTE — Progress Notes (Addendum)
PROGRESS NOTE        PATIENT DETAILS Name: Ian [name] Age: 81 y.o. Sex: male Date of Birth: 12/26/1935 Admit Date: 12/30/2016 Admitting Physician Etta Quill, DO UYQ:[id number], [name] L, DO  Brief Narrative: Patient is a 81 y.o. male with past medical history significant for Type II DM, HTN, CKD, HLD, COPD and Alzheimer's. Surgical history is significant for exploratory laparotomy and hernia repair. He presented to the ED at the instruction of of his PCP when he started to experience abdominal discomfort, emesis and decreased bowel movements. CT of the abdomen confirmed dilation of small bowel most likely due to low grade obstruction. He was admitted for management of his SBO, an NG tube was unable to be placed but patient felt better 8 hours after admission.   Patient had a bowel movement yesterday morning. This morning, he states he had 3 more bowel movements and many episodes of flatus. A repeat abdominal XR showed a persistent small bowel obstruction. Surgery was consulted for further management.  Subjective: Patient sitting comfortably in chair without complaints. States he tolerated soft food diet well and has no nausea, abdominal pain or vomiting. Denies chest pain, shortness of breath.   Assessment/Plan: Low grade small bowel obstruction: Clinical picture and abdominal XR are conflicting, surgery consulted. They initiated SBO protocol and NG tube was placed. Continue with SBO protocol per surgery.  COPD with asthma: Without exacerbation, nasal cannula removed. Patient saturation is 94% on RA. Continue nebulizer treatments as needed.   Diabetes mellitus: CBG increased but within acceptable limits, 144 today. Continue SSI and monitor.  Chronic kidney disease (CKD) stage G3a/A2: Creatinine continues to improve and has returned to baseline.   Hypertension: SBP has slight upward trend, most recent BP 159/70. Continue amlodipine, hydralazine and metoprolol and  monitor.  Hyperlipidemia: continue statin and ASA.    DVT Prophylaxis: Prophylactic Lovenox   Code Status: Full code   Family Communication: Spouse at bedside  Disposition Plan: Remain inpatient  Antimicrobial agents: Anti-infectives    None      Procedures: None  CONSULTS:  general surgery  Time spent: 25 minutes-Greater than 50% of this time was spent in counseling, explanation of diagnosis, planning of further management, and coordination of care.  MEDICATIONS: Scheduled Meds: . [START ON 01/03/2017] amLODipine  10 mg Per NG tube Daily  . aspirin EC  81 mg Oral Daily  . atorvastatin  20 mg Oral q1800  . diatrizoate meglumine-sodium  90 mL Per NG tube Once  . enoxaparin (LOVENOX) injection  40 mg Subcutaneous Daily  . hydrALAZINE  25 mg Oral BID  . insulin aspart  0-9 Units Subcutaneous Q4H  . metoprolol tartrate  50 mg Per NG tube BID  . PARoxetine  20 mg Oral Daily   Continuous Infusions: PRN Meds:.acetaminophen **OR** acetaminophen, albuterol, ipratropium-albuterol, montelukast, ondansetron **OR** ondansetron (ZOFRAN) IV, traZODone   PHYSICAL EXAM: Vital signs: Vitals:   01/01/17 2124 01/01/17 2127 01/02/17 0447 01/02/17 0934  BP: (!) 142/61 (!) 142/61 (!) 159/70   Pulse: 60 60 76   Resp: 18  18   Temp: 98.4 F (36.9 C)  98.2 F (36.8 C)   TempSrc: Oral     SpO2: 95%  95% 94%  Weight:      Height:       Filed Weights   12/30/16 1711 12/31/16 0248  Weight:  86.4 kg (190 lb 6.4 oz) 100.2 kg (221 lb)   Body mass index is 34.61 kg/m.   General appearance: Awake, alert, not in any distress. Non toxic Looking HEENT: Atraumatic and Normocephalic Neck: supple, no JVD. No cervical lymphadenopathy. No thyromegaly Resp:  CVS:  GI: Normo to hyperactive bowel sounds in the right quadrants, hypoactive bowel sounds in left quadrants. No TTP, without guarding. Extremities: B/L Lower Ext shows no edema, both legs are warm to touch Neurology: speech  clear, ambulates steadily without difficulty. No focal neuro deficits. Psychiatric: Normal judgment and insight. Alert and oriented x 3. Normal mood. Skin: warm and dry  I have personally reviewed following labs and imaging studies  LABORATORY DATA: CBC:  Recent Labs Lab 12/30/16 1143 12/30/16 1807 01/01/17 0426  WBC 11.2* 11.3* 8.3  NEUTROABS 8,736*  --   --   HGB 13.7 13.7 11.4*  HCT 41.9 41.3 36.4*  MCV 96.8 96.5 98.4  PLT 186 163 135*    Basic Metabolic Panel:  Recent Labs Lab 12/30/16 1143 12/30/16 1807 12/31/16 0711 01/01/17 0426 01/02/17 0346  NA 142 141 141 141 141  K 4.3 4.4 3.8 4.0 3.8  CL 107 108 110 114* 111  CO2 24 26 25 23 24   GLUCOSE 155* 132* 125* 107* 97  BUN 42* 42* 36* 29* 22*  CREATININE 2.11* 2.14* 2.01* 1.79* 1.55*  CALCIUM 10.2 10.2 9.0 8.8* 9.3  MG  --   --   --   --  1.9    GFR: Estimated Creatinine Clearance: 44.3 mL/min (A) (by C-G formula based on SCr of 1.55 mg/dL (H)).  Liver Function Tests:  Recent Labs Lab 12/30/16 1143 12/30/16 1807 01/01/17 0426  AST 15 17 12*  ALT 10 14* 10*  ALKPHOS 53 49 37*  BILITOT 0.5 0.4 0.6  PROT 6.5 6.9 5.1*  ALBUMIN 3.8 3.8 2.7*    Recent Labs Lab 12/30/16 1143 12/30/16 1807  LIPASE 24 28  AMYLASE 46  --    No results for input(s): AMMONIA in the last 168 hours.  Coagulation Profile: No results for input(s): INR, PROTIME in the last 168 hours.  Cardiac Enzymes: No results for input(s): CKTOTAL, CKMB, CKMBINDEX, TROPONINI in the last 168 hours.  BNP (last 3 results) No results for input(s): PROBNP in the last 8760 hours.  HbA1C: No results for input(s): HGBA1C in the last 72 hours.  CBG:  Recent Labs Lab 01/01/17 2031 01/02/17 0034 01/02/17 0435 01/02/17 0726 01/02/17 1219  GLUCAP 102* 102* 87 104* 144*    Lipid Profile: No results for input(s): CHOL, HDL, LDLCALC, TRIG, CHOLHDL, LDLDIRECT in the last 72 hours.  Thyroid Function Tests: No results for input(s):  TSH, T4TOTAL, FREET4, T3FREE, THYROIDAB in the last 72 hours.  Anemia Panel: No results for input(s): VITAMINB12, FOLATE, FERRITIN, TIBC, IRON, RETICCTPCT in the last 72 hours.  Urine analysis:    Component Value Date/Time   COLORURINE YELLOW 12/29/2012 1650   APPEARANCEUR CLEAR 12/29/2012 1650   LABSPEC 1.025 12/29/2012 1650   PHURINE 5.5 12/29/2012 1650   GLUCOSEU NEGATIVE 12/29/2012 1650   HGBUR MODERATE (A) 12/29/2012 1650   BILIRUBINUR SMALL (A) 12/29/2012 1650   KETONESUR NEGATIVE 12/29/2012 1650   PROTEINUR >300 (A) 12/29/2012 1650   UROBILINOGEN 1.0 12/29/2012 1650   NITRITE NEGATIVE 12/29/2012 1650   LEUKOCYTESUR NEGATIVE 12/29/2012 1650    Sepsis Labs: Lactic Acid, Venous    Component Value Date/Time   LATICACIDVEN 0.92 12/30/2016 2222    MICROBIOLOGY: No  results found for this or any previous visit (from the past 240 hour(s)).  RADIOLOGY STUDIES/RESULTS: Ct Abdomen Pelvis Wo Contrast  Result Date: 12/31/2016 CLINICAL DATA:  Abdominal pain. EXAM: CT ABDOMEN AND PELVIS WITHOUT CONTRAST TECHNIQUE: Multidetector CT imaging of the abdomen and pelvis was performed following the standard protocol without IV contrast. COMPARISON:  Radiographs 12/30/2016 FINDINGS: Lower chest: No acute abnormality. Hepatobiliary: Several small calculi are present in the gallbladder lumen. No bile duct dilatation. No focal liver lesions. Pancreas: Unremarkable. No pancreatic ductal dilatation or surrounding inflammatory changes. Spleen: Normal in size without focal abnormality. Adrenals/Urinary Tract: Adrenal glands are unremarkable. Kidneys are normal, without renal calculi, focal lesion, or hydronephrosis. Bladder is unremarkable. Stomach/Bowel: Moderate dilatation of stomach and small bowel. Mild small bowel mural thickening and edema. Distal small bowel is relatively decompressed but an abrupt focal caliber transition is not evident. No focal inflammation of bowel. No mass. No pneumatosis. No  extraluminal air. Colon is normal. Vascular/Lymphatic: Moderate atherosclerotic calcification. No aneurysm. Nonspecific nodes in the inguinal regions, more likely benign. Reproductive: Unremarkable Other: No ascites. Musculoskeletal: No significant skeletal lesion. IMPRESSION: 1. Moderate dilatation of small bowel in its proximal and midportions, probably a low-grade obstruction. No mass or hernia is evident. No bowel perforation. 2. Cholelithiasis. 3. Aortic atherosclerosis. Electronically Signed   By: Andreas Newport M.D.   On: 12/31/2016 00:32   Dg Abd 1 View  Result Date: 12/30/2016 CLINICAL DATA:  Nausea, vomiting, and diarrhea for 1 week, history diabetes mellitus, hypertension, COPD EXAM: ABDOMEN - 1 VIEW COMPARISON:  None FINDINGS: Multiple dilated small bowel loops up to 6.3 cm diameter with paucity of colonic gas highly suspicious for small bowel obstruction. No definite bowel wall thickening. Diffuse osseous demineralization. No urinary tract calcification. IMPRESSION: Multiple dilated loops of small bowel with paucity of colonic gas highly suspicious for small bowel obstruction. These results will be called to the ordering clinician or representative by the Radiologist Assistant, and communication documented in the PACS or zVision Dashboard. Electronically Signed   By: Lavonia Dana M.D.   On: 12/30/2016 14:13   Dg Abd 2 Views  Result Date: 01/02/2017 CLINICAL DATA:  81 year old male with the history small bowel obstruction EXAM: ABDOMEN - 2 VIEW COMPARISON:  CT 12/31/2016 FINDINGS: Upright image demonstrates multiple air-fluid levels within the left and right abdomen. Distention of small bowel loops more prominent in the left abdomen. Paucity of colonic gas, compatible with findings on prior CT. No gastric tube visualized. Linear radiopaque structure overlying the liver parenchyma, as was seen on prior CT. No displaced fracture. IMPRESSION: Persisting small bowel obstruction. Electronically  Signed   By: Corrie Mckusick D.O.   On: 01/02/2017 08:08   Dg Abd Portable 1v-small Bowel Protocol-position Verification  Result Date: 01/02/2017 CLINICAL DATA:  NG tube placement . EXAM: PORTABLE ABDOMEN - 1 VIEW COMPARISON:  01/02/2017 .  CT 12/31/2016 FINDINGS: NG tube noted with tip projected over the stomach. Multiple distended loops of small bowel again noted without significant interim change. Findings suggest small bowel obstruction. Colon is nondistended. No free air. No acute bony abnormality. IMPRESSION: NG tube noted with tip projected over the stomach . Persistent distended loops of small bowel suggesting small bowel obstruction . No change from prior exams. Electronically Signed   By: Marcello Moores  Register   On: 01/02/2017 11:56     LOS: 2 days   Kathleene Hazel, PA-S  [facility] Attending MD note  Patient was seen, examined,treatment plan was discussed with the PA-S  Kathleene Hazel.  I have personally reviewed the clinical findings, lab, imaging studies and management of this patient in detail. I agree with the documentation, as recorded by the PA-S and changes to above note were in bold green  Patient is 80/y/o M with multiple medical hx including exploratory laparotomy and hernia repair, who is admitted for SBO. Patient was clinically improve and although repeating x-ray this morning shows persistent small bowel obstruction.  Patient seen and examined separate from PA, patient report having flatness and bowel movement this morning although did not tolerate diet very well.  On Exam: Gen. exam: Awake, alert, not in any distress Chest: Good air entry bilaterally, no rhonchi or rales CVS: S1-S2 regular, no murmurs Abdomen: Abdomen significantly distended, tympanic to percussion, non tenderness Neurology: Non-focal Skin: No rash or lesions  Impression: Persistent SBO  COPD/Asthma - stable DM type II CKD stage III - stable HTN - above goal, likely secondary to holding  BP medications and pain  Plan: Surgery was consulted, patient placed nothing by mouth and small bowel protocol was initiated. NG tube in place Continue pain medication as needed Will add hydralazine IV when necessary for BP over 117 Continue BP medications to NG tube Place in IV fluid D5 half NS as patient is nothing by mouth Continue to monitor Check BMP in the morning  Rest as above  Chipper Oman, MD   If 7PM-7AM, please contact night-coverage [email or web address] Password [email or web address] 01/02/2017, 1:15 PM

## 2017-01-02 NOTE — Consult Note (Signed)
[facility] Surgery Consult Note  Ian [name] 1935-09-26  [id number].    Requesting MD: Patrecia Pour Chief Complaint/Reason for Consult: SBO  HPI:  Ian [name] is a 81yo male admitted to [facility] 12/30/16 with a SBO. Patient states that he began having abdominal pain, abdominal distension, nausea, and vomiting 12/29/16. Went to see PCP who sent him to the ED after xray showed possible SBO. States that the pain was generalized about his abdomen and constant. Upon admission CT scan showed moderate dilatation of small bowel in its proximal and midportions, probably a low-grade obstruction; no mass or hernia; no bowel perforation. NG was unable to be placed in ED, therefore he was just placed on bowel rest. Diet was slowly advanced as symptoms improved. Patient had been tolerating small amounts of regular diet with no n/v, and he was passing flatus and having loose BM's. Patient continued to have abdominal distension so a repeat xray was performed this morning and showed dilated small bowel with a persistent SBO. General surgery asked to consult. Patient is currently sitting up in chair, wife at bedside. States that he still feels distended, but has very little abdominal pain. Denies n/v. Reports a loose BM x2 yesterday. States that he tolerated small amounts of solid food for breakfast.  PMH significant for DM, HTN, CKD, COPD, Alzheimers Abdominal surgical history: ex lap to eval for SBO (intraop no obstruction found, had an ileus) 06/18/2001 Dr. Ninfa Linden Former smoker Lives at home with wife  ROS: Review of Systems  Constitutional: Negative.   HENT: Negative.   Eyes: Negative.   Respiratory: Negative.   Cardiovascular: Negative.   Gastrointestinal: Positive for abdominal pain, diarrhea, nausea and vomiting. Negative for blood in stool, constipation, heartburn and melena.  Genitourinary: Negative.   Musculoskeletal: Negative.   Skin: Negative.   Neurological: Negative.   All systems reviewed  and otherwise negative except for as above  Family History  Problem Relation Age of Onset  . Pancreatic cancer Sister   . Heart disease Father   . Hypertension Brother   . Hypertension Sister     Past Medical History:  Diagnosis Date  . Allergy   . Alzheimer's disease   . Anemia, unspecified   . Anxiety   . Chronic maxillary sinusitis   . COPD (chronic obstructive pulmonary disease) ([place])   . Depression   . Diabetes mellitus   . Edema   . Hypercalcemia   . Hyperlipidemia   . Hypertension   . Hypertrophy of prostate without urinary obstruction and other lower urinary tract symptoms (LUTS)   . Hypertrophy of prostate without urinary obstruction and other lower urinary tract symptoms (LUTS)   . Kidney disease    mild  . Unspecified disorder of kidney and ureter   . Unspecified sleep apnea   . Ventral hernia, unspecified, without mention of obstruction or gangrene     Past Surgical History:  Procedure Laterality Date  . EXPLORATORY LAPAROTOMY  2002  . HERNIA REPAIR      Social History:  reports that he quit smoking about 28 years ago. He quit after 30.00 years of use. He has never used smokeless tobacco. He reports that he does not drink alcohol or use drugs.  Allergies:  Allergies  Allergen Reactions  . Ace Inhibitors Cough    Medications Prior to Admission  Medication Sig Dispense Refill  . albuterol (PROVENTIL HFA;VENTOLIN HFA) 108 (90 Base) MCG/ACT inhaler Inhale 2 puffs into the lungs every 6 (six) hours as needed for wheezing  or shortness of breath. 3 Inhaler 3  . amLODipine (NORVASC) 10 MG tablet Take 1 tablet (10 mg total) by mouth daily. 90 tablet 3  . aspirin EC 81 MG tablet Take 1 tablet (81 mg total) by mouth daily.    Marland Kitchen atorvastatin (LIPITOR) 20 MG tablet Take one tablet by mouth once daily for cholesterol 90 tablet 3  . Blood Glucose Monitoring Suppl (ONE TOUCH ULTRA 2) w/Device KIT Test blood sugar three times daily DX E11.9 1 each 0  . glucose blood  (ONE TOUCH ULTRA TEST) test strip Use as instructed to test blood sugar once daily dx E119 100 each 0  . hydrALAZINE (APRESOLINE) 25 MG tablet Take 1 tablet (25 mg total) by mouth 2 (two) times daily. 180 tablet 3  . ipratropium-albuterol (DUONEB) 0.5-2.5 (3) MG/3ML SOLN Take 3 mLs by nebulization every 6 (six) hours as needed (wheezing). 360 mL 3  . Lancets (ONETOUCH ULTRASOFT) lancets Use to test blood sugar up to three times daily. Dx E11.9 100 each 12  . linagliptin (TRADJENTA) 5 MG TABS tablet TAKE 1 TABLET ONE TIME DAILY TO CONTROL BLOOD SUGAR 30 tablet 1  . losartan (COZAAR) 50 MG tablet Take 1 tablet (50 mg total) by mouth daily. 90 tablet 3  . metoprolol (LOPRESSOR) 50 MG tablet Take 1 tablet (50 mg total) by mouth 2 (two) times daily. 180 tablet 3  . montelukast (SINGULAIR) 10 MG tablet Take 1 tablet (10 mg total) by mouth at bedtime. (Patient taking differently: Take 10 mg by mouth daily as needed (allergies). ) 90 tablet 3  . PARoxetine (PAXIL) 20 MG tablet Take 1 tablet by mouth once daily for depression 90 tablet 1  . traZODone (DESYREL) 50 MG tablet Take 1 tablet by mouth at bedtime as needed for sleep 90 tablet 3    Prior to Admission medications   Medication Sig Start Date End Date Taking? Authorizing Provider  albuterol (PROVENTIL HFA;VENTOLIN HFA) 108 (90 Base) MCG/ACT inhaler Inhale 2 puffs into the lungs every 6 (six) hours as needed for wheezing or shortness of breath. 10/04/15  Yes [name], [name] L, DO  amLODipine (NORVASC) 10 MG tablet Take 1 tablet (10 mg total) by mouth daily. 08/15/16  Yes [name], [name] L, DO  aspirin EC 81 MG tablet Take 1 tablet (81 mg total) by mouth daily. 10/06/14  Yes [name], [name] L, DO  atorvastatin (LIPITOR) 20 MG tablet Take one tablet by mouth once daily for cholesterol 07/23/16  Yes [name], [name] L, DO  Blood Glucose Monitoring Suppl (ONE TOUCH ULTRA 2) w/Device KIT Test blood sugar three times daily DX E11.9 10/10/16  Yes [name], [name] L, DO   glucose blood (ONE TOUCH ULTRA TEST) test strip Use as instructed to test blood sugar once daily dx E119 09/20/16  Yes [name], [name] L, DO  hydrALAZINE (APRESOLINE) 25 MG tablet Take 1 tablet (25 mg total) by mouth 2 (two) times daily. 08/15/16  Yes [name], [name] L, DO  ipratropium-albuterol (DUONEB) 0.5-2.5 (3) MG/3ML SOLN Take 3 mLs by nebulization every 6 (six) hours as needed (wheezing). 04/17/15  Yes [name], [name] L, DO  Lancets (ONETOUCH ULTRASOFT) lancets Use to test blood sugar up to three times daily. Dx E11.9 08/05/16  Yes [name], [name] L, DO  linagliptin (TRADJENTA) 5 MG TABS tablet TAKE 1 TABLET ONE TIME DAILY TO CONTROL BLOOD SUGAR 07/31/16  Yes Estill Dooms, MD  losartan (COZAAR) 50 MG tablet Take 1 tablet (50 mg total) by mouth daily. 09/20/16  Yes [name], [name] L, DO  metoprolol (LOPRESSOR) 50 MG tablet Take 1 tablet (50 mg total) by mouth 2 (two) times daily. 07/05/16  Yes [name], [name] L, DO  montelukast (SINGULAIR) 10 MG tablet Take 1 tablet (10 mg total) by mouth at bedtime. Patient taking differently: Take 10 mg by mouth daily as needed (allergies).  10/01/16  Yes [name], [name] L, DO  PARoxetine (PAXIL) 20 MG tablet Take 1 tablet by mouth once daily for depression 12/03/16  Yes [name], [name] L, DO  traZODone (DESYREL) 50 MG tablet Take 1 tablet by mouth at bedtime as needed for sleep 07/05/16  Yes [name], [name] L, DO    Blood pressure (!) 159/70, pulse 76, temperature 98.2 F (36.8 C), resp. rate 18, height 5' 7"  (1.702 m), weight 221 lb (100.2 kg), SpO2 94 %. Physical Exam: General: pleasant, WD/WN white male who is laying in bed in NAD HEENT: head is normocephalic, atraumatic.  Sclera are noninjected.  Pupils equal and round.  Ears and nose without any masses or lesions.  Mouth is pink and moist. Dentition fair Heart: regular, rate, and rhythm.  No obvious murmurs, gallops, or rubs noted.  Palpable pedal pulses bilaterally Lungs: CTAB, no wheezes, rhonchi, or rales noted.   Respiratory effort nonlabored Abd: well healed midline incision, soft, distended, +BS, no masses, hernias, or organomegaly. nontender. MS: all 4 extremities are symmetrical with no cyanosis, clubbing, or edema. Skin: warm and dry with no masses, lesions, or rashes Psych: A&Ox3 with an appropriate affect. Neuro: cranial nerves grossly intact, extremity CSM intact bilaterally, normal speech  Results for orders placed or performed during the hospital encounter of 12/30/16 (from the past 48 hour(s))  Glucose, capillary     Status: Abnormal   Collection Time: 12/31/16 12:51 PM  Result Value Ref Range   Glucose-Capillary 103 (H) 65 - 99 mg/dL  Glucose, capillary     Status: Abnormal   Collection Time: 12/31/16  4:34 PM  Result Value Ref Range   Glucose-Capillary 101 (H) 65 - 99 mg/dL  Glucose, capillary     Status: Abnormal   Collection Time: 12/31/16  8:12 PM  Result Value Ref Range   Glucose-Capillary 116 (H) 65 - 99 mg/dL  Glucose, capillary     Status: Abnormal   Collection Time: 01/01/17 12:23 AM  Result Value Ref Range   Glucose-Capillary 118 (H) 65 - 99 mg/dL  Comprehensive metabolic panel     Status: Abnormal   Collection Time: 01/01/17  4:26 AM  Result Value Ref Range   Sodium 141 135 - 145 mmol/L   Potassium 4.0 3.5 - 5.1 mmol/L   Chloride 114 (H) 101 - 111 mmol/L   CO2 23 22 - 32 mmol/L   Glucose, Bld 107 (H) 65 - 99 mg/dL   BUN 29 (H) 6 - 20 mg/dL   Creatinine, Ser 1.79 (H) 0.61 - 1.24 mg/dL   Calcium 8.8 (L) 8.9 - 10.3 mg/dL   Total Protein 5.1 (L) 6.5 - 8.1 g/dL   Albumin 2.7 (L) 3.5 - 5.0 g/dL   AST 12 (L) 15 - 41 U/L   ALT 10 (L) 17 - 63 U/L   Alkaline Phosphatase 37 (L) 38 - 126 U/L   Total Bilirubin 0.6 0.3 - 1.2 mg/dL   GFR calc non Af Amer 35 (L) >60 mL/min   GFR calc Af Amer 40 (L) >60 mL/min    Comment: (NOTE) The eGFR has been calculated using the CKD EPI equation. This calculation has  not been validated in all clinical situations. eGFR's persistently  <60 mL/min signify possible Chronic Kidney Disease.    Anion gap 4 (L) 5 - 15  CBC     Status: Abnormal   Collection Time: 01/01/17  4:26 AM  Result Value Ref Range   WBC 8.3 4.0 - 10.5 K/uL   RBC 3.70 (L) 4.22 - 5.81 MIL/uL   Hemoglobin 11.4 (L) 13.0 - 17.0 g/dL   HCT 36.4 (L) 39.0 - 52.0 %   MCV 98.4 78.0 - 100.0 fL   MCH 30.8 26.0 - 34.0 pg   MCHC 31.3 30.0 - 36.0 g/dL   RDW 14.3 11.5 - 15.5 %   Platelets 135 (L) 150 - 400 K/uL  Glucose, capillary     Status: Abnormal   Collection Time: 01/01/17  4:27 AM  Result Value Ref Range   Glucose-Capillary 110 (H) 65 - 99 mg/dL  Glucose, capillary     Status: Abnormal   Collection Time: 01/01/17  8:10 AM  Result Value Ref Range   Glucose-Capillary 113 (H) 65 - 99 mg/dL  Glucose, capillary     Status: Abnormal   Collection Time: 01/01/17 12:41 PM  Result Value Ref Range   Glucose-Capillary 108 (H) 65 - 99 mg/dL  Glucose, capillary     Status: Abnormal   Collection Time: 01/01/17  4:09 PM  Result Value Ref Range   Glucose-Capillary 109 (H) 65 - 99 mg/dL  Glucose, capillary     Status: Abnormal   Collection Time: 01/01/17  8:31 PM  Result Value Ref Range   Glucose-Capillary 102 (H) 65 - 99 mg/dL   Comment 1 Notify RN    Comment 2 Document in Chart   Glucose, capillary     Status: Abnormal   Collection Time: 01/02/17 12:34 AM  Result Value Ref Range   Glucose-Capillary 102 (H) 65 - 99 mg/dL   Comment 1 Notify RN    Comment 2 Document in Chart   Basic metabolic panel     Status: Abnormal   Collection Time: 01/02/17  3:46 AM  Result Value Ref Range   Sodium 141 135 - 145 mmol/L   Potassium 3.8 3.5 - 5.1 mmol/L   Chloride 111 101 - 111 mmol/L   CO2 24 22 - 32 mmol/L   Glucose, Bld 97 65 - 99 mg/dL   BUN 22 (H) 6 - 20 mg/dL   Creatinine, Ser 1.55 (H) 0.61 - 1.24 mg/dL   Calcium 9.3 8.9 - 10.3 mg/dL   GFR calc non Af Amer 41 (L) >60 mL/min   GFR calc Af Amer 48 (L) >60 mL/min    Comment: (NOTE) The eGFR has been calculated  using the CKD EPI equation. This calculation has not been validated in all clinical situations. eGFR's persistently <60 mL/min signify possible Chronic Kidney Disease.    Anion gap 6 5 - 15  Magnesium     Status: None   Collection Time: 01/02/17  3:46 AM  Result Value Ref Range   Magnesium 1.9 1.7 - 2.4 mg/dL  Glucose, capillary     Status: None   Collection Time: 01/02/17  4:35 AM  Result Value Ref Range   Glucose-Capillary 87 65 - 99 mg/dL   Comment 1 Notify RN    Comment 2 Document in Chart   Glucose, capillary     Status: Abnormal   Collection Time: 01/02/17  7:26 AM  Result Value Ref Range   Glucose-Capillary 104 (H) 65 - 99 mg/dL  Dg Abd 2 Views  Result Date: 01/02/2017 CLINICAL DATA:  81 year old male with the history small bowel obstruction EXAM: ABDOMEN - 2 VIEW COMPARISON:  CT 12/31/2016 FINDINGS: Upright image demonstrates multiple air-fluid levels within the left and right abdomen. Distention of small bowel loops more prominent in the left abdomen. Paucity of colonic gas, compatible with findings on prior CT. No gastric tube visualized. Linear radiopaque structure overlying the liver parenchyma, as was seen on prior CT. No displaced fracture. IMPRESSION: Persisting small bowel obstruction. Electronically Signed   By: Corrie Mckusick D.O.   On: 01/02/2017 08:08   Anti-infectives    None        Assessment/Plan SBO - abdominal surgical history: ex lap to eval for SBO (intraop no obstruction found, had an ileus) 06/18/2001 Dr. Ninfa Linden - admitted 12/30/16 with abdominal pain, nausea, vomiting - CT scan showed moderate dilatation of small bowel in its proximal and midportions, probably a low-grade obstruction; no mass or hernia is evident; no bowel perforation - unable to place NG tube in ED - XR today showed dilated small bowel and a persistent SBO - patient is tolerating small amount of regular diet, and having loose BM's - WBC 8.3 (6/27),  afebrile  COPD DM CKD HTN HLD Alzheimers  ID - none VTE - SCDs, lovenox FEN - IVF, NGT/NPO  Plan - Patient needs an NG tube to LIWS (if unable to place at bedside, recommend sending to IR for NG tube placement). NPO. Will start SB protocol once NG tube is in place.  Jerrye Beavers, [facility] Surgery 01/02/2017, 10:19 AM Pager: [phone] Consults: [phone] Mon-Fri 7:00 am-4:30 pm Sat-Sun 7:00 am-11:30 am

## 2017-01-02 NOTE — Consult Note (Signed)
[facility] CM Primary Care Navigator  01/02/2017  Ian [name] Oct 21, 1935 [id number]  Met with [name]) at the bedside to identify possible discharge needs.  Wife reports that patient was having increased abdominal pain, nausea/ vomiting and diarrhea that had led to this admission. Patient endorses Ian [name] with [facility] as the primary care provider. Patient's wife had been under [facility] CM service in the past.    Patient's wife shared using [employer] Order service and [facility] at [facility] to obtain medications without difficulty.   Patient reports managing his medications at home with wife's assistance using "pill box" system filled weekly.   Patient is able to drive prior to admission but wife will provide transportationto hisdoctors'appointments after discharge.   Patient's wife (retired Therapist, sports) will be his primary caregiver at home as stated.  Anticipated discharge plan is home according to wife.  Patient and wife expressedunderstanding to call primary care provider's officewhen he returns home,for a post discharge follow-up appointment within a week or sooner if needed.Patient letter (with PCP's contact number) was provided as theirreminder.  Explained to patient about [facility] CM services available for him (DM/ COPD). Wife verbalized that patient is aware of managing his chronic illnesses with her guidance and assistance. Wife and patient denies any health management needs or concerns at this time.  Both voiced understanding to request for a referral to [facility] care managementservicesfrom primary care provider if deemed appropriate and necessaryin the future.   [facility] care management contact information provided for future needs that may arise.   For questions, please contact:  Dannielle Huh, BSN, RN- [facility] Primary Care Navigator  Telephone: [phone] [place]

## 2017-01-02 NOTE — Progress Notes (Signed)
Pt's o2 sat 94% on RA while ambulating.

## 2017-01-03 DIAGNOSIS — R140 Abdominal distension (gaseous): Secondary | ICD-10-CM

## 2017-01-03 LAB — BASIC METABOLIC PANEL
Anion gap: 7 (ref 5–15)
BUN: 20 mg/dL (ref 6–20)
CALCIUM: 9.4 mg/dL (ref 8.9–10.3)
CHLORIDE: 114 mmol/L — AB (ref 101–111)
CO2: 22 mmol/L (ref 22–32)
CREATININE: 1.48 mg/dL — AB (ref 0.61–1.24)
GFR calc Af Amer: 50 mL/min — ABNORMAL LOW (ref >60)
GFR calc non Af Amer: 44 mL/min — ABNORMAL LOW (ref >60)
Glucose, Bld: 121 mg/dL — ABNORMAL HIGH (ref 65–99)
Potassium: 3.4 mmol/L — ABNORMAL LOW (ref 3.5–5.1)
SODIUM: 143 mmol/L (ref 135–145)

## 2017-01-03 LAB — GLUCOSE, CAPILLARY
GLUCOSE-CAPILLARY: 123 mg/dL — AB (ref 65–99)
GLUCOSE-CAPILLARY: 129 mg/dL — AB (ref 65–99)
GLUCOSE-CAPILLARY: 137 mg/dL — AB (ref 65–99)
Glucose-Capillary: 109 mg/dL — ABNORMAL HIGH (ref 65–99)
Glucose-Capillary: 143 mg/dL — ABNORMAL HIGH (ref 65–99)
Glucose-Capillary: 137 mg/dL — ABNORMAL HIGH (ref 65–99)

## 2017-01-03 NOTE — Progress Notes (Signed)
Patient ID: Ian [name], male   DOB: Dec 20, 1935, 81 y.o.   MRN: [id number]  [facility] Surgery Progress Note     Subjective: CC- SBO, abdominal distension Sitting up in bed this morning, wife at bedside. States that he feels a little better. Abdomen still distended, but a little improved. Passing flatus and had a BM yesterday. Does not like the NG tube. SB protocol XR showed contrast in the colon, but persistent partial SBO.   Objective: Vital signs in last 24 hours: Temp:  [98.2 F (36.8 C)-98.4 F (36.9 C)] 98.4 F (36.9 C) (06/29 0459) Pulse Rate:  [63-70] 63 (06/29 0459) Resp:  [18-20] 18 (06/29 0459) BP: (153-178)/(64-76) 157/70 (06/29 0459) SpO2:  [93 %-98 %] 98 % (06/29 0459) Last BM Date: 01/02/17  Intake/Output from previous day: 06/28 0701 - 06/29 0700 In: 600 [P.O.:200; I.V.:400] Out: 301 [Urine:100; Emesis/NG output:200; Stool:1] Intake/Output this shift: No intake/output data recorded.  PE: Gen:  Alert, NAD, pleasant HEENT: EOM's intact, pupils equal  Card:  RRR, no M/G/R heard Pulm:  CTAB, no W/R/R, effort normal Abd: distended but slightly less than yesterday, softer, +BS, no HSM, no hernia Ext:  No erythema, edema, or tenderness BUE/BLE  Skin: no rashes noted, warm and dry  Lab Results:   Recent [name]  01/01/17 0426  WBC 8.3  HGB 11.4*  HCT 36.4*  PLT 135*   BMET  Recent [name]  01/01/17 0426 01/02/17 0346  NA 141 141  K 4.0 3.8  CL 114* 111  CO2 23 24  GLUCOSE 107* 97  BUN 29* 22*  CREATININE 1.79* 1.55*  CALCIUM 8.8* 9.3   PT/INR No results for input(s): LABPROT, INR in the last 72 hours. CMP     Component Value Date/Time   NA 141 01/02/2017 0346   NA 136 (A) 03/25/2016   K 3.8 01/02/2017 0346   CL 111 01/02/2017 0346   CO2 24 01/02/2017 0346   GLUCOSE 97 01/02/2017 0346   BUN 22 (H) 01/02/2017 0346   BUN 29 (A) 03/25/2016   CREATININE 1.55 (H) 01/02/2017 0346   CREATININE 2.11 (H) 12/30/2016 1143   CALCIUM 9.3  01/02/2017 0346   PROT 5.1 (L) 01/01/2017 0426   PROT 6.4 10/05/2015 1135   ALBUMIN 2.7 (L) 01/01/2017 0426   ALBUMIN 3.0 (L) 10/05/2015 1135   AST 12 (L) 01/01/2017 0426   ALT 10 (L) 01/01/2017 0426   ALKPHOS 37 (L) 01/01/2017 0426   BILITOT 0.6 01/01/2017 0426   BILITOT 0.5 10/05/2015 1135   GFRNONAA 41 (L) 01/02/2017 0346   GFRNONAA 29 (L) 12/30/2016 1143   GFRAA 48 (L) 01/02/2017 0346   GFRAA 34 (L) 12/30/2016 1143   Lipase     Component Value Date/Time   LIPASE 28 12/30/2016 1807       Studies/Results: Dg Abd 2 Views  Result Date: 01/02/2017 CLINICAL DATA:  81 year old male with the history small bowel obstruction EXAM: ABDOMEN - 2 VIEW COMPARISON:  CT 12/31/2016 FINDINGS: Upright image demonstrates multiple air-fluid levels within the left and right abdomen. Distention of small bowel loops more prominent in the left abdomen. Paucity of colonic gas, compatible with findings on prior CT. No gastric tube visualized. Linear radiopaque structure overlying the liver parenchyma, as was seen on prior CT. No displaced fracture. IMPRESSION: Persisting small bowel obstruction. Electronically Signed   By: Corrie Mckusick D.O.   On: 01/02/2017 08:08   Dg Abd Portable 1v-small Bowel Obstruction Protocol-initial, 8 Hr Delay  Result Date: 01/03/2017  CLINICAL DATA:  Small bowel obstruction EXAM: PORTABLE ABDOMEN - 1 VIEW COMPARISON:  01/02/2017 FINDINGS: Esophageal tube tip projects over the GE junction. All persistent gaseous dilatation of bowel diffusely. There is contrast present within the colon. IMPRESSION: 1. Persistent diffuse increased bowel gas which may relate to partial obstruction ; there is contrast material present within the colon. Electronically Signed   By: Donavan Foil M.D.   On: 01/03/2017 00:29   Dg Abd Portable 1v-small Bowel Protocol-position Verification  Result Date: 01/02/2017 CLINICAL DATA:  NG tube placement . EXAM: PORTABLE ABDOMEN - 1 VIEW COMPARISON:  01/02/2017 .   CT 12/31/2016 FINDINGS: NG tube noted with tip projected over the stomach. Multiple distended loops of small bowel again noted without significant interim change. Findings suggest small bowel obstruction. Colon is nondistended. No free air. No acute bony abnormality. IMPRESSION: NG tube noted with tip projected over the stomach . Persistent distended loops of small bowel suggesting small bowel obstruction . No change from prior exams. Electronically Signed   By: Marcello Moores  Register   On: 01/02/2017 11:56    Anti-infectives: Anti-infectives    None       Assessment/Plan COPD DM CKD HTN HLD Alzheimers   SBO - abdominal surgical history: ex lap to eval for SBO (intraop no obstruction found, had an ileus) 06/18/2001 Dr. Ninfa Linden - admitted 12/30/16 with abdominal pain, nausea, vomiting - CT scan showed moderate dilatation of small bowel in its proximal and midportions, probably a low-grade obstruction; no mass or hernia is evident; no bowel perforation - XR yesterday showed contrast in the colon, but persistent partial SBO  ID - none VTE - SCDs, lovenox FEN - IVF, NGT/NPO  Plan - Continue NPO/NGT to LIWS. Repeat XR in AM. Ok to clamp tube for 10-15 minutes to allow patient to ambulate.   LOS: 3 days    Jerrye Beavers , [facility] Surgery 01/03/2017, 7:53 AM Pager: [phone] Consults: [phone] Mon-Fri 7:00 am-4:30 pm Sat-Sun 7:00 am-11:30 am

## 2017-01-03 NOTE — Progress Notes (Signed)
PROGRESS NOTE        PATIENT DETAILS Name: Ian [name] Age: 81 y.o. Sex: male Date of Birth: 04-Mar-1936 Admit Date: 12/30/2016 Admitting Physician Etta Quill, DO DJM:EQAS, [name] L, DO  Brief Narrative: Patient is a 81 y.o. male with past medical history significant for Type II DM, HTN, CKD, HLD, COPD and Alzheimer's. Surgical history is significant for an exploratory laparotomy and hernia repair. He presented to the Ed at the instruction of his PCP when he started experiencing abdominal discomfort, emesis and decreased bowel movements. CT of the abdomen confirmed dilation of small bowel most likely due to low grade obstruction. He was admitted for management of his SBO but an NG tube was unable to be placed.  Day 2 of patients admission, he is feeling better and had a few episodes of loose stool and flatus. He was advanced to soft diet and tolerated it well. Abdominal XR continued to show an unresolved obstruction and general surgery was consulted who initiated SBO protocol. NG tube was placed and patient being managed medically at this time.  Subjective: Patient laying in bed comfortably, complaining of increased pain and a "rough night". He denies chest pain, shortness of breath, nausea or vomiting. States he has had 3 to 4 more episodes of loose stool since yesterday and ~4 episodes of flatus, much less than normal. States his NG tube was twisted last night and that they had to replace it.  Assessment/Plan: Small bowel obstruction: Per surgery, continue NG tube for another 24 hours and repeat XR in the morning.  - Continue NPO status - Continue zofran for nausea  COPD with asthma: without exacerbation, continue nebulizer treatments as needed.   Diabetes mellitus: CBG's continue to remain stable. Continue SSI.  Chronic kidney disease (CKD) stage G3a/A2: Creatinine continues to improve and has returned to baseline.     Hypertension: BP stable, continue  amlodipine, hydralazine and metoprolol. Continue to monitor.   Hyperlipidemia: Continue statin and ASA.  DVT Prophylaxis: Prophylactic Lovenox   Code Status: Full   Family Communication: Spouse at bedside  Disposition Plan: Remain inpatient  Antimicrobial agents: Anti-infectives    None      Procedures: None  CONSULTS:  general surgery  Time spent: 25 minutes-Greater than 50% of this time was spent in counseling, explanation of diagnosis, planning of further management, and coordination of care.  MEDICATIONS: Scheduled Meds: . amLODipine  10 mg Per NG tube Daily  . aspirin EC  81 mg Oral Daily  . atorvastatin  20 mg Oral q1800  . enoxaparin (LOVENOX) injection  40 mg Subcutaneous Daily  . insulin aspart  0-9 Units Subcutaneous Q4H  . metoprolol tartrate  50 mg Per NG tube BID  . PARoxetine  20 mg Oral Daily   Continuous Infusions: . dextrose 5 % and 0.45% NaCl 50 mL/hr at 01/03/17 0600   PRN Meds:.acetaminophen **OR** acetaminophen, albuterol, hydrALAZINE, ipratropium-albuterol, montelukast, morphine injection, ondansetron **OR** ondansetron (ZOFRAN) IV, traZODone   PHYSICAL EXAM: Vital signs: Vitals:   01/02/17 1853 01/02/17 1956 01/02/17 2202 01/03/17 0459  BP: (!) 153/64 (!) 153/65 (!) 153/65 (!) 157/70  Pulse:  70 70 63  Resp:  20  18  Temp:  98.2 F (36.8 C)  98.4 F (36.9 C)  TempSrc:  Oral    SpO2:  93%  98%  Weight:  Height:       Filed Weights   12/30/16 1711 12/31/16 0248  Weight: 86.4 kg (190 lb 6.4 oz) 100.2 kg (221 lb)   Body mass index is 34.61 kg/m.   General appearance : Awake, alert, not in distress. Speech Clear. Non toxic  HEENT: Atraumatic and Normocephalic Resp: CTA bilaterally, no added sounds CVS: RRR, no murmurs GI: Bowel sounds auscultated in all 4 quadrants, diffusely TTP, distended without guarding Extremities: B/L Lower Ext shows no edema, both legs are warm to touch Neurology: speech clear,Non focal, sensation  is grossly intact. Psychiatric: Normal judgment and insight. Alert and oriented x 3. Normal mood. Skin: warm and dry  I have personally reviewed following labs and imaging studies  LABORATORY DATA: CBC:  Recent Labs Lab 12/30/16 1143 12/30/16 1807 01/01/17 0426  WBC 11.2* 11.3* 8.3  NEUTROABS 8,736*  --   --   HGB 13.7 13.7 11.4*  HCT 41.9 41.3 36.4*  MCV 96.8 96.5 98.4  PLT 186 163 135*    Basic Metabolic Panel:  Recent Labs Lab 12/30/16 1807 12/31/16 0711 01/01/17 0426 01/02/17 0346 01/03/17 0831  NA 141 141 141 141 143  K 4.4 3.8 4.0 3.8 3.4*  CL 108 110 114* 111 114*  CO2 26 25 23 24 22   GLUCOSE 132* 125* 107* 97 121*  BUN 42* 36* 29* 22* 20  CREATININE 2.14* 2.01* 1.79* 1.55* 1.48*  CALCIUM 10.2 9.0 8.8* 9.3 9.4  MG  --   --   --  1.9  --     GFR: Estimated Creatinine Clearance: 46.4 mL/min (A) (by C-G formula based on SCr of 1.48 mg/dL (H)).  Liver Function Tests:  Recent Labs Lab 12/30/16 1143 12/30/16 1807 01/01/17 0426  AST 15 17 12*  ALT 10 14* 10*  ALKPHOS 53 49 37*  BILITOT 0.5 0.4 0.6  PROT 6.5 6.9 5.1*  ALBUMIN 3.8 3.8 2.7*    Recent Labs Lab 12/30/16 1143 12/30/16 1807  LIPASE 24 28  AMYLASE 46  --    No results for input(s): AMMONIA in the last 168 hours.  Coagulation Profile: No results for input(s): INR, PROTIME in the last 168 hours.  Cardiac Enzymes: No results for input(s): CKTOTAL, CKMB, CKMBINDEX, TROPONINI in the last 168 hours.  BNP (last 3 results) No results for input(s): PROBNP in the last 8760 hours.  HbA1C: No results for input(s): HGBA1C in the last 72 hours.  CBG:  Recent Labs Lab 01/02/17 2011 01/03/17 0007 01/03/17 0418 01/03/17 0743 01/03/17 1229  GLUCAP 114* 123* 137* 109* 129*    Lipid Profile: No results for input(s): CHOL, HDL, LDLCALC, TRIG, CHOLHDL, LDLDIRECT in the last 72 hours.  Thyroid Function Tests: No results for input(s): TSH, T4TOTAL, FREET4, T3FREE, THYROIDAB in the last  72 hours.  Anemia Panel: No results for input(s): VITAMINB12, FOLATE, FERRITIN, TIBC, IRON, RETICCTPCT in the last 72 hours.  Urine analysis:    Component Value Date/Time   COLORURINE YELLOW 12/29/2012 1650   APPEARANCEUR CLEAR 12/29/2012 1650   LABSPEC 1.025 12/29/2012 1650   PHURINE 5.5 12/29/2012 1650   GLUCOSEU NEGATIVE 12/29/2012 1650   HGBUR MODERATE (A) 12/29/2012 1650   BILIRUBINUR SMALL (A) 12/29/2012 1650   KETONESUR NEGATIVE 12/29/2012 1650   PROTEINUR >300 (A) 12/29/2012 1650   UROBILINOGEN 1.0 12/29/2012 1650   NITRITE NEGATIVE 12/29/2012 1650   LEUKOCYTESUR NEGATIVE 12/29/2012 1650    Sepsis Labs: Lactic Acid, Venous    Component Value Date/Time   LATICACIDVEN 0.92 12/30/2016  2222    MICROBIOLOGY: No results found for this or any previous visit (from the past 240 hour(s)).  RADIOLOGY STUDIES/RESULTS: Ct Abdomen Pelvis Wo Contrast  Result Date: 12/31/2016 CLINICAL DATA:  Abdominal pain. EXAM: CT ABDOMEN AND PELVIS WITHOUT CONTRAST TECHNIQUE: Multidetector CT imaging of the abdomen and pelvis was performed following the standard protocol without IV contrast. COMPARISON:  Radiographs 12/30/2016 FINDINGS: Lower chest: No acute abnormality. Hepatobiliary: Several small calculi are present in the gallbladder lumen. No bile duct dilatation. No focal liver lesions. Pancreas: Unremarkable. No pancreatic ductal dilatation or surrounding inflammatory changes. Spleen: Normal in size without focal abnormality. Adrenals/Urinary Tract: Adrenal glands are unremarkable. Kidneys are normal, without renal calculi, focal lesion, or hydronephrosis. Bladder is unremarkable. Stomach/Bowel: Moderate dilatation of stomach and small bowel. Mild small bowel mural thickening and edema. Distal small bowel is relatively decompressed but an abrupt focal caliber transition is not evident. No focal inflammation of bowel. No mass. No pneumatosis. No extraluminal air. Colon is normal.  Vascular/Lymphatic: Moderate atherosclerotic calcification. No aneurysm. Nonspecific nodes in the inguinal regions, more likely benign. Reproductive: Unremarkable Other: No ascites. Musculoskeletal: No significant skeletal lesion. IMPRESSION: 1. Moderate dilatation of small bowel in its proximal and midportions, probably a low-grade obstruction. No mass or hernia is evident. No bowel perforation. 2. Cholelithiasis. 3. Aortic atherosclerosis. Electronically Signed   By: Andreas Newport M.D.   On: 12/31/2016 00:32   Dg Abd 1 View  Result Date: 12/30/2016 CLINICAL DATA:  Nausea, vomiting, and diarrhea for 1 week, history diabetes mellitus, hypertension, COPD EXAM: ABDOMEN - 1 VIEW COMPARISON:  None FINDINGS: Multiple dilated small bowel loops up to 6.3 cm diameter with paucity of colonic gas highly suspicious for small bowel obstruction. No definite bowel wall thickening. Diffuse osseous demineralization. No urinary tract calcification. IMPRESSION: Multiple dilated loops of small bowel with paucity of colonic gas highly suspicious for small bowel obstruction. These results will be called to the ordering clinician or representative by the Radiologist Assistant, and communication documented in the PACS or zVision Dashboard. Electronically Signed   By: Lavonia Dana M.D.   On: 12/30/2016 14:13   Dg Abd 2 Views  Result Date: 01/02/2017 CLINICAL DATA:  81 year old male with the history small bowel obstruction EXAM: ABDOMEN - 2 VIEW COMPARISON:  CT 12/31/2016 FINDINGS: Upright image demonstrates multiple air-fluid levels within the left and right abdomen. Distention of small bowel loops more prominent in the left abdomen. Paucity of colonic gas, compatible with findings on prior CT. No gastric tube visualized. Linear radiopaque structure overlying the liver parenchyma, as was seen on prior CT. No displaced fracture. IMPRESSION: Persisting small bowel obstruction. Electronically Signed   By: Corrie Mckusick D.O.   On:  01/02/2017 08:08   Dg Abd Portable 1v-small Bowel Obstruction Protocol-initial, 8 Hr Delay  Result Date: 01/03/2017 CLINICAL DATA:  Small bowel obstruction EXAM: PORTABLE ABDOMEN - 1 VIEW COMPARISON:  01/02/2017 FINDINGS: Esophageal tube tip projects over the GE junction. All persistent gaseous dilatation of bowel diffusely. There is contrast present within the colon. IMPRESSION: 1. Persistent diffuse increased bowel gas which may relate to partial obstruction ; there is contrast material present within the colon. Electronically Signed   By: Donavan Foil M.D.   On: 01/03/2017 00:29   Dg Abd Portable 1v-small Bowel Protocol-position Verification  Result Date: 01/02/2017 CLINICAL DATA:  NG tube placement . EXAM: PORTABLE ABDOMEN - 1 VIEW COMPARISON:  01/02/2017 .  CT 12/31/2016 FINDINGS: NG tube noted with tip projected over the stomach.  Multiple distended loops of small bowel again noted without significant interim change. Findings suggest small bowel obstruction. Colon is nondistended. No free air. No acute bony abnormality. IMPRESSION: NG tube noted with tip projected over the stomach . Persistent distended loops of small bowel suggesting small bowel obstruction . No change from prior exams. Electronically Signed   By: Marcello Moores  Register   On: 01/02/2017 11:56     LOS: 3 days   Kathleene Hazel, PA-S  [facility]  Attending MD note  Patient was seen, examined,treatment plan was discussed with the [place]. I have personally reviewed the clinical findings, lab, imaging studies and management of this patient in detail. I agree with the documentation, as recorded by the PA-S and changes to above note were in bold green  Patient is 80/y/o M with multiple medical hx including exploratory laparotomy and hernia repair, who is admitted for SBO. Patient was clinically improve and although repeating x-ray this morning shows persistent small bowel obstruction.  Patient seen and  examined separate from PA-S - still with partial obstruction. Some abdominal pain.   On Exam: Gen. exam: Awake, alert, not in any distress Chest: Good air entry bilaterally, no rhonchi or rales CVS: S1-S2 regular, no murmurs Abdomen: Still distended, mild tenderness, BS present  Skin: No rash or lesions   Impression: Persistent SBO  COPD/Asthma - stable DM type II CKD stage III - stable HTN - above goal, likely secondary to holding BP medications and pain  Plan: Surgery recommendations appreciated  NG tube for 24 hrs  Continue pain medication as needed Continue hydralazine IV when necessary for BP over 117 Continue BP medications to NG tube Continue IVF  Continue to monitor Check BMP in AM   Rest as above  Chipper Oman, MD    If 7PM-7AM, please contact night-coverage [email or web address] Password [email or web address] 01/03/2017, 12:54 PM

## 2017-01-04 ENCOUNTER — Inpatient Hospital Stay (HOSPITAL_COMMUNITY): Payer: PPO

## 2017-01-04 LAB — GLUCOSE, CAPILLARY
GLUCOSE-CAPILLARY: 124 mg/dL — AB (ref 65–99)
GLUCOSE-CAPILLARY: 129 mg/dL — AB (ref 65–99)
Glucose-Capillary: 115 mg/dL — ABNORMAL HIGH (ref 65–99)
Glucose-Capillary: 118 mg/dL — ABNORMAL HIGH (ref 65–99)
Glucose-Capillary: 121 mg/dL — ABNORMAL HIGH (ref 65–99)
Glucose-Capillary: 135 mg/dL — ABNORMAL HIGH (ref 65–99)

## 2017-01-04 LAB — BASIC METABOLIC PANEL
ANION GAP: 7 (ref 5–15)
BUN: 15 mg/dL (ref 6–20)
CHLORIDE: 109 mmol/L (ref 101–111)
CO2: 27 mmol/L (ref 22–32)
CREATININE: 1.41 mg/dL — AB (ref 0.61–1.24)
Calcium: 9.7 mg/dL (ref 8.9–10.3)
GFR calc non Af Amer: 46 mL/min — ABNORMAL LOW (ref >60)
GFR, EST AFRICAN AMERICAN: 54 mL/min — AB (ref >60)
Glucose, Bld: 145 mg/dL — ABNORMAL HIGH (ref 65–99)
POTASSIUM: 3.4 mmol/L — AB (ref 3.5–5.1)
Sodium: 143 mmol/L (ref 135–145)

## 2017-01-04 LAB — CBC
HEMATOCRIT: 39.1 % (ref 39.0–52.0)
Hemoglobin: 13.2 g/dL (ref 13.0–17.0)
MCH: 31.9 pg (ref 26.0–34.0)
MCHC: 33.8 g/dL (ref 30.0–36.0)
MCV: 94.4 fL (ref 78.0–100.0)
Platelets: 144 10*3/uL — ABNORMAL LOW (ref 150–400)
RBC: 4.14 MIL/uL — AB (ref 4.22–5.81)
RDW: 13.9 % (ref 11.5–15.5)
WBC: 9.9 10*3/uL (ref 4.0–10.5)

## 2017-01-04 MED ORDER — POTASSIUM CHLORIDE 10 MEQ/100ML IV SOLN
INTRAVENOUS | Status: AC
  Filled 2017-01-04: qty 100

## 2017-01-04 MED ORDER — POTASSIUM CHLORIDE 10 MEQ/100ML IV SOLN
10.0000 meq | INTRAVENOUS | Status: AC
  Administered 2017-01-04 (×3): 10 meq via INTRAVENOUS
  Filled 2017-01-04 (×2): qty 100

## 2017-01-04 MED ORDER — POTASSIUM CHLORIDE 2 MEQ/ML IV SOLN
INTRAVENOUS | Status: DC
  Administered 2017-01-04: 11:00:00 via INTRAVENOUS
  Filled 2017-01-04 (×3): qty 1000

## 2017-01-04 NOTE — Progress Notes (Signed)
PROGRESS NOTE        PATIENT DETAILS Name: Ian [name] Age: 81 y.o. Sex: male Date of Birth: December 28, 1935 Admit Date: 12/30/2016 Admitting Physician Etta Quill, DO FUX:NATF, [name] L, DO  Brief Narrative: Patient is a 81 y.o. male with past medical history significant for Type II DM, HTN, CKD, HLD, COPD and Alzheimer's. Surgical history is significant for an exploratory laparotomy and hernia repair. He presented to the Ed at the instruction of his PCP when he started experiencing abdominal discomfort, emesis and decreased bowel movements. CT of the abdomen confirmed dilation of small bowel most likely due to low grade obstruction. He was admitted for management of his SBO but an NG tube was unable to be placed.   Patient was doing well with conservative treatment, but subsequently developed pain and abd xray showed persistent SBO, surgery was consulted and small bowel protocol was initiated. NGT was placed and   Subjective: Patient seen and examined, feel a lot better + flatus and loose stools last night. Abd Xray with contrast in colon  Assessment/Plan: Small bowel obstruction:  Initial CT abd showed low grade obstruction, subsequently developed full SBO  Surgical hx of Ex lap Conservative treatment  D/c NGT, start clear liquids  Encourage ambulation  Pain management as needed   COPD/Asthma:  Without acute exacerbation  Stable  Continue current regimen   Diabetes mellitus: CBG's continue to remain stable. Continue SSI.  Chronic kidney disease (CKD) stage G3a/A2: Creatinine continues to improve and has returned to baseline. Check BMP in AM     Hypertension: BP stable, continue amlodipine, hydralazine and metoprolol. Continue to monitor.   Hyperlipidemia: Continue statin and ASA.  DVT Prophylaxis: Prophylactic Lovenox   Code Status: Full   Family Communication: Spouse at bedside  Disposition Plan: Remain inpatient  Antimicrobial  agents: Anti-infectives    None      Procedures: None  CONSULTS:  general surgery  Time spent: 15 minutes-Greater than 50% of this time was spent in counseling, explanation of diagnosis, planning of further management, and coordination of care.  MEDICATIONS: Scheduled Meds: . amLODipine  10 mg Per NG tube Daily  . aspirin EC  81 mg Oral Daily  . atorvastatin  20 mg Oral q1800  . enoxaparin (LOVENOX) injection  40 mg Subcutaneous Daily  . insulin aspart  0-9 Units Subcutaneous Q4H  . metoprolol tartrate  50 mg Per NG tube BID  . PARoxetine  20 mg Oral Daily   Continuous Infusions: . lactated ringers 1,000 mL with potassium chloride 40 mEq infusion    . potassium chloride     PRN Meds:.acetaminophen **OR** acetaminophen, albuterol, hydrALAZINE, ipratropium-albuterol, montelukast, morphine injection, ondansetron **OR** ondansetron (ZOFRAN) IV, traZODone   PHYSICAL EXAM: Vital signs: Vitals:   01/03/17 1401 01/03/17 1523 01/03/17 2117 01/04/17 0451  BP: (!) 177/72 (!) 159/82 (!) 178/77 (!) 177/67  Pulse: 72  64 62  Resp: 18  18 17   Temp: 98.4 F (36.9 C)  98.8 F (37.1 C) 98.4 F (36.9 C)  TempSrc: Oral  Oral Oral  SpO2: 94%  93% 96%  Weight:      Height:       Filed Weights   12/30/16 1711 12/31/16 0248  Weight: 86.4 kg (190 lb 6.4 oz) 100.2 kg (221 lb)   Body mass index is 34.61 kg/m.   General: Pt is  alert, awake, not in acute distress Cardiovascular: RRR, S1/S2 +, no rubs, no gallops Respiratory: CTA bilaterally, no wheezing, no rhonchi Abdominal: Soft, NT, ND, bowel sounds + Extremities: no edema, no cyanosis  I have personally reviewed following labs and imaging studies  LABORATORY DATA: CBC:  Recent Labs Lab 12/30/16 1143 12/30/16 1807 01/01/17 0426 01/04/17 0456  WBC 11.2* 11.3* 8.3 9.9  NEUTROABS 8,736*  --   --   --   HGB 13.7 13.7 11.4* 13.2  HCT 41.9 41.3 36.4* 39.1  MCV 96.8 96.5 98.4 94.4  PLT 186 163 135* 144*    Basic  Metabolic Panel:  Recent Labs Lab 12/31/16 0711 01/01/17 0426 01/02/17 0346 01/03/17 0831 01/04/17 0456  NA 141 141 141 143 143  K 3.8 4.0 3.8 3.4* 3.4*  CL 110 114* 111 114* 109  CO2 25 23 24 22 27   GLUCOSE 125* 107* 97 121* 145*  BUN 36* 29* 22* 20 15  CREATININE 2.01* 1.79* 1.55* 1.48* 1.41*  CALCIUM 9.0 8.8* 9.3 9.4 9.7  MG  --   --  1.9  --   --     GFR: Estimated Creatinine Clearance: 48.7 mL/min (A) (by C-G formula based on SCr of 1.41 mg/dL (H)).  Liver Function Tests:  Recent Labs Lab 12/30/16 1143 12/30/16 1807 01/01/17 0426  AST 15 17 12*  ALT 10 14* 10*  ALKPHOS 53 49 37*  BILITOT 0.5 0.4 0.6  PROT 6.5 6.9 5.1*  ALBUMIN 3.8 3.8 2.7*    Recent Labs Lab 12/30/16 1143 12/30/16 1807  LIPASE 24 28  AMYLASE 46  --    No results for input(s): AMMONIA in the last 168 hours.  Coagulation Profile: No results for input(s): INR, PROTIME in the last 168 hours.  Cardiac Enzymes: No results for input(s): CKTOTAL, CKMB, CKMBINDEX, TROPONINI in the last 168 hours.  BNP (last 3 results) No results for input(s): PROBNP in the last 8760 hours.  HbA1C: No results for input(s): HGBA1C in the last 72 hours.  CBG:  Recent Labs Lab 01/03/17 1548 01/03/17 1946 01/04/17 0025 01/04/17 0408 01/04/17 0809  GLUCAP 143* 137* 121* 118* 124*    Lipid Profile: No results for input(s): CHOL, HDL, LDLCALC, TRIG, CHOLHDL, LDLDIRECT in the last 72 hours.  Thyroid Function Tests: No results for input(s): TSH, T4TOTAL, FREET4, T3FREE, THYROIDAB in the last 72 hours.  Anemia Panel: No results for input(s): VITAMINB12, FOLATE, FERRITIN, TIBC, IRON, RETICCTPCT in the last 72 hours.  Urine analysis:    Component Value Date/Time   COLORURINE YELLOW 12/29/2012 1650   APPEARANCEUR CLEAR 12/29/2012 1650   LABSPEC 1.025 12/29/2012 1650   PHURINE 5.5 12/29/2012 1650   GLUCOSEU NEGATIVE 12/29/2012 1650   HGBUR MODERATE (A) 12/29/2012 1650   BILIRUBINUR SMALL (A)  12/29/2012 1650   KETONESUR NEGATIVE 12/29/2012 1650   PROTEINUR >300 (A) 12/29/2012 1650   UROBILINOGEN 1.0 12/29/2012 1650   NITRITE NEGATIVE 12/29/2012 1650   LEUKOCYTESUR NEGATIVE 12/29/2012 1650    Sepsis Labs: Lactic Acid, Venous    Component Value Date/Time   LATICACIDVEN 0.92 12/30/2016 2222    MICROBIOLOGY: No results found for this or any previous visit (from the past 240 hour(s)).  RADIOLOGY STUDIES/RESULTS: Ct Abdomen Pelvis Wo Contrast  Result Date: 12/31/2016 CLINICAL DATA:  Abdominal pain. EXAM: CT ABDOMEN AND PELVIS WITHOUT CONTRAST TECHNIQUE: Multidetector CT imaging of the abdomen and pelvis was performed following the standard protocol without IV contrast. COMPARISON:  Radiographs 12/30/2016 FINDINGS: Lower chest: No acute abnormality.  Hepatobiliary: Several small calculi are present in the gallbladder lumen. No bile duct dilatation. No focal liver lesions. Pancreas: Unremarkable. No pancreatic ductal dilatation or surrounding inflammatory changes. Spleen: Normal in size without focal abnormality. Adrenals/Urinary Tract: Adrenal glands are unremarkable. Kidneys are normal, without renal calculi, focal lesion, or hydronephrosis. Bladder is unremarkable. Stomach/Bowel: Moderate dilatation of stomach and small bowel. Mild small bowel mural thickening and edema. Distal small bowel is relatively decompressed but an abrupt focal caliber transition is not evident. No focal inflammation of bowel. No mass. No pneumatosis. No extraluminal air. Colon is normal. Vascular/Lymphatic: Moderate atherosclerotic calcification. No aneurysm. Nonspecific nodes in the inguinal regions, more likely benign. Reproductive: Unremarkable Other: No ascites. Musculoskeletal: No significant skeletal lesion. IMPRESSION: 1. Moderate dilatation of small bowel in its proximal and midportions, probably a low-grade obstruction. No mass or hernia is evident. No bowel perforation. 2. Cholelithiasis. 3. Aortic  atherosclerosis. Electronically Signed   By: Andreas Newport M.D.   On: 12/31/2016 00:32   Dg Abd 1 View  Result Date: 12/30/2016 CLINICAL DATA:  Nausea, vomiting, and diarrhea for 1 week, history diabetes mellitus, hypertension, COPD EXAM: ABDOMEN - 1 VIEW COMPARISON:  None FINDINGS: Multiple dilated small bowel loops up to 6.3 cm diameter with paucity of colonic gas highly suspicious for small bowel obstruction. No definite bowel wall thickening. Diffuse osseous demineralization. No urinary tract calcification. IMPRESSION: Multiple dilated loops of small bowel with paucity of colonic gas highly suspicious for small bowel obstruction. These results will be called to the ordering clinician or representative by the Radiologist Assistant, and communication documented in the PACS or zVision Dashboard. Electronically Signed   By: Lavonia Dana M.D.   On: 12/30/2016 14:13   Dg Abd 2 Views  Result Date: 01/02/2017 CLINICAL DATA:  81 year old male with the history small bowel obstruction EXAM: ABDOMEN - 2 VIEW COMPARISON:  CT 12/31/2016 FINDINGS: Upright image demonstrates multiple air-fluid levels within the left and right abdomen. Distention of small bowel loops more prominent in the left abdomen. Paucity of colonic gas, compatible with findings on prior CT. No gastric tube visualized. Linear radiopaque structure overlying the liver parenchyma, as was seen on prior CT. No displaced fracture. IMPRESSION: Persisting small bowel obstruction. Electronically Signed   By: Corrie Mckusick D.O.   On: 01/02/2017 08:08   Dg Abd Portable 1v-small Bowel Obstruction Protocol-initial, 8 Hr Delay  Result Date: 01/03/2017 CLINICAL DATA:  Small bowel obstruction EXAM: PORTABLE ABDOMEN - 1 VIEW COMPARISON:  01/02/2017 FINDINGS: Esophageal tube tip projects over the GE junction. All persistent gaseous dilatation of bowel diffusely. There is contrast present within the colon. IMPRESSION: 1. Persistent diffuse increased bowel gas  which may relate to partial obstruction ; there is contrast material present within the colon. Electronically Signed   By: Donavan Foil M.D.   On: 01/03/2017 00:29   Dg Abd Portable 1v-small Bowel Protocol-position Verification  Result Date: 01/02/2017 CLINICAL DATA:  NG tube placement . EXAM: PORTABLE ABDOMEN - 1 VIEW COMPARISON:  01/02/2017 .  CT 12/31/2016 FINDINGS: NG tube noted with tip projected over the stomach. Multiple distended loops of small bowel again noted without significant interim change. Findings suggest small bowel obstruction. Colon is nondistended. No free air. No acute bony abnormality. IMPRESSION: NG tube noted with tip projected over the stomach . Persistent distended loops of small bowel suggesting small bowel obstruction . No change from prior exams. Electronically Signed   By: Marcello Moores  Register   On: 01/02/2017 11:56  LOS: 4 days   Chipper Oman, MD  [facility] If 7PM-7AM, please contact night-coverage [email or web address] Password [email or web address] 01/04/2017, 9:22 AM

## 2017-01-04 NOTE — Progress Notes (Signed)
Subjective: Passing flatus and loose stools, although not high volume.  Denies nausea.  Not much pain.  Wants to get NG tube out. X-rays this morning show contrast in colon and  SBO looks like it is resolved. WBC 9900.  Potassium 3.4.  Creatinine 1.41.  Objective: Vital signs in last 24 hours: Temp:  [98.4 F (36.9 C)-98.8 F (37.1 C)] 98.4 F (36.9 C) (06/30 0451) Pulse Rate:  [62-72] 62 (06/30 0451) Resp:  [17-18] 17 (06/30 0451) BP: (159-178)/(67-82) 177/67 (06/30 0451) SpO2:  [93 %-96 %] 96 % (06/30 0451) Last BM Date: 01/03/17  Intake/Output from previous day: 06/29 0701 - 06/30 0700 In: 1227 [I.V.:1227] Out: 1051 [Urine:350; Emesis/NG output:700; Stool:1] Intake/Output this shift: No intake/output data recorded.     PE: Gen:  Alert, NAD, pleasant Card:  RRR, no [name]/G/R heard Pulm:  CTAB, no W/R/R, effort normal Abd: Soft.  Nontender.  Borderline distended.  Some bowel sounds present. Ext:  No erythema, edema, or tenderness  Skin: no rashes noted, warm and dry   Lab Results:   Recent Labs  01/04/17 0456  WBC 9.9  HGB 13.2  HCT 39.1  PLT 144*   BMET  Recent Labs  01/03/17 0831 01/04/17 0456  NA 143 143  K 3.4* 3.4*  CL 114* 109  CO2 22 27  GLUCOSE 121* 145*  BUN 20 15  CREATININE 1.48* 1.41*  CALCIUM 9.4 9.7   PT/INR No results for input(s): LABPROT, INR in the last 72 hours. ABG No results for input(s): PHART, HCO3 in the last 72 hours.  Invalid input(s): PCO2, PO2  Studies/Results: Dg Abd Portable 1v-small Bowel Obstruction Protocol-initial, 8 Hr Delay  Result Date: 01/03/2017 CLINICAL DATA:  Small bowel obstruction EXAM: PORTABLE ABDOMEN - 1 VIEW COMPARISON:  01/02/2017 FINDINGS: Esophageal tube tip projects over the GE junction. All persistent gaseous dilatation of bowel diffusely. There is contrast present within the colon. IMPRESSION: 1. Persistent diffuse increased bowel gas which may relate to partial obstruction ; there is  contrast material present within the colon. Electronically Signed   By: Donavan Foil [name].D.   On: 01/03/2017 00:29   Dg Abd Portable 1v-small Bowel Protocol-position Verification  Result Date: 01/02/2017 CLINICAL DATA:  NG tube placement . EXAM: PORTABLE ABDOMEN - 1 VIEW COMPARISON:  01/02/2017 .  CT 12/31/2016 FINDINGS: NG tube noted with tip projected over the stomach. Multiple distended loops of small bowel again noted without significant interim change. Findings suggest small bowel obstruction. Colon is nondistended. No free air. No acute bony abnormality. IMPRESSION: NG tube noted with tip projected over the stomach . Persistent distended loops of small bowel suggesting small bowel obstruction . No change from prior exams. Electronically Signed   By: Marcello Moores  Register   On: 01/02/2017 11:56    Anti-infectives: Anti-infectives    None      Assessment/Plan:   COPD DM CKD HTN HLD Alzheimers   SBO - abdominal surgical history: ex lap to eval for SBO (intraop no obstruction found, had an ileus) 12/12/2002Dr. Ninfa Linden - admitted 12/30/16 with abdominal pain, nausea, vomiting - CT scan showed moderate dilatation of small bowel in its proximal and midportions, probably a low-grade obstruction; no mass or hernia is evident; no bowel perforation - XRAY today shows contrast in colon and SBO pattern resolving showed contrast in the colon  ID - none VTE - SCDs, lovenox FEN - IVF, NGT/NPO  Plan -discontinue NG tube.  Start clear liquids.  Replace potassium.  Ambulate more.  Lab  work Architectural technologist.   LOS: 4 days    Ian [name] 01/04/2017

## 2017-01-05 LAB — CBC
HCT: 41.4 % (ref 39.0–52.0)
HEMOGLOBIN: 14.1 g/dL (ref 13.0–17.0)
MCH: 32.5 pg (ref 26.0–34.0)
MCHC: 34.1 g/dL (ref 30.0–36.0)
MCV: 95.4 fL (ref 78.0–100.0)
PLATELETS: 176 10*3/uL (ref 150–400)
RBC: 4.34 MIL/uL (ref 4.22–5.81)
RDW: 13.9 % (ref 11.5–15.5)
WBC: 14.3 10*3/uL — ABNORMAL HIGH (ref 4.0–10.5)

## 2017-01-05 LAB — GLUCOSE, CAPILLARY
GLUCOSE-CAPILLARY: 115 mg/dL — AB (ref 65–99)
GLUCOSE-CAPILLARY: 134 mg/dL — AB (ref 65–99)
GLUCOSE-CAPILLARY: 93 mg/dL (ref 65–99)
Glucose-Capillary: 120 mg/dL — ABNORMAL HIGH (ref 65–99)
Glucose-Capillary: 105 mg/dL — ABNORMAL HIGH (ref 65–99)
Glucose-Capillary: 115 mg/dL — ABNORMAL HIGH (ref 65–99)

## 2017-01-05 LAB — BASIC METABOLIC PANEL
Anion gap: 10 (ref 5–15)
BUN: 14 mg/dL (ref 6–20)
CALCIUM: 10 mg/dL (ref 8.9–10.3)
CHLORIDE: 106 mmol/L (ref 101–111)
CO2: 24 mmol/L (ref 22–32)
CREATININE: 1.51 mg/dL — AB (ref 0.61–1.24)
GFR, EST AFRICAN AMERICAN: 49 mL/min — AB (ref >60)
GFR, EST NON AFRICAN AMERICAN: 42 mL/min — AB (ref >60)
Glucose, Bld: 123 mg/dL — ABNORMAL HIGH (ref 65–99)
Potassium: 4 mmol/L (ref 3.5–5.1)
SODIUM: 140 mmol/L (ref 135–145)

## 2017-01-05 MED ORDER — BISACODYL 10 MG RE SUPP
10.0000 mg | Freq: Two times a day (BID) | RECTAL | Status: AC
  Administered 2017-01-05 (×2): 10 mg via RECTAL
  Filled 2017-01-05 (×2): qty 1

## 2017-01-05 MED ORDER — METOPROLOL TARTRATE 25 MG PO TABS
50.0000 mg | ORAL_TABLET | Freq: Two times a day (BID) | ORAL | Status: DC
  Administered 2017-01-05 – 2017-01-06 (×2): 50 mg via ORAL
  Filled 2017-01-05 (×2): qty 2

## 2017-01-05 MED ORDER — HYDRALAZINE HCL 25 MG PO TABS
25.0000 mg | ORAL_TABLET | Freq: Three times a day (TID) | ORAL | Status: DC
  Administered 2017-01-05 – 2017-01-06 (×3): 25 mg via ORAL
  Filled 2017-01-05 (×3): qty 1

## 2017-01-05 MED ORDER — AMLODIPINE BESYLATE 5 MG PO TABS
10.0000 mg | ORAL_TABLET | Freq: Every day | ORAL | Status: DC
  Administered 2017-01-06: 10 mg via ORAL
  Filled 2017-01-05: qty 2

## 2017-01-05 NOTE — Progress Notes (Signed)
  Subjective: Feels better.  Tolerating clear liquids.  No nausea.  Pain less. Had 1 stool.  Passing a little flatus  Afebrile.  Stable.  WBC of 14,300.  Significance unknown.  Rest of labs are fine.   Objective: Vital signs in last 24 hours: Temp:  [98.1 F (36.7 C)-98.3 F (36.8 C)] 98.2 F (36.8 C) (07/01 0422) Pulse Rate:  [59-66] 66 (07/01 0422) Resp:  [20] 20 (07/01 0422) BP: (155-185)/(64-80) 184/67 (07/01 0422) SpO2:  [93 %-99 %] 99 % (07/01 0422) Last BM Date: 01/04/17  Intake/Output from previous day: 06/30 0701 - 07/01 0700 In: 1020.8 [P.O.:700; I.V.:220.8; IV Piggyback:100] Out: 750 [Urine:750] Intake/Output this shift: Total I/O In: 300 [P.O.:300] Out: 50 [Urine:50]    PE: Gen: Alert, NAD, pleasant Card: RRR, no [name]/G/R heard Pulm: CTAB, no W/R/R, effort normal Abd: Soft.  Nontender.   minimally distended.  Some bowel sounds present. Ext: No erythema, edema, or tenderness  Skin: no rashes noted, warm and dry    Lab Results:   Recent Labs  01/04/17 0456 01/05/17 0703  WBC 9.9 14.3*  HGB 13.2 14.1  HCT 39.1 41.4  PLT 144* 176   BMET  Recent Labs  01/04/17 0456 01/05/17 0703  NA 143 140  K 3.4* 4.0  CL 109 106  CO2 27 24  GLUCOSE 145* 123*  BUN 15 14  CREATININE 1.41* 1.51*  CALCIUM 9.7 10.0   PT/INR No results for input(s): LABPROT, INR in the last 72 hours. ABG No results for input(s): PHART, HCO3 in the last 72 hours.  Invalid input(s): PCO2, PO2  Studies/Results: Dg Abd Portable 1v  Result Date: 01/04/2017 CLINICAL DATA:  Abdominal distention.  Small bowel obstruction. EXAM: PORTABLE ABDOMEN - 1 VIEW COMPARISON:  01/02/2017 FINDINGS: Small bowel dilation noted on the prior exams has essentially resolved. Contrast is seen within a normal caliber colon. Nasogastric tube is positioned within the mid stomach. IMPRESSION: 1. No residual small bowel dilation. Contrast present within a nondilated colon. Findings are consistent  with a resolved partial small bowel obstruction. Electronically Signed   By: [name]   On: 01/04/2017 10:03    Anti-infectives: Anti-infectives    None      Assessment/Plan:   COPD DM CKD HTN HLD Alzheimers  SBO - abdominal surgical history: ex lap to eval for SBO (intraop no obstruction found, had an ileus) 12/12/2012Dr. Ninfa Linden - admitted 12/30/16 with abdominal pain, nausea, vomiting - CT scan showed moderate dilatation of small bowel in its proximal and midportions, probably a low-grade obstruction; no mass or hernia is evident; no bowel perforation - XRAY 6/30  shows contrast in colon and SBO pattern resolving showed contrast in the colon   ID - none VTE - SCDs, lovenox FEN - IVF, NGT/NPO  Plan - Clinically SBO is resolving.  Advance to full liquids.  Discontinue narcotics.  Continue Tylenol.  Discussed this with patient and he is agreeable.     LOS: 5 days    Ian [name] 01/05/2017

## 2017-01-05 NOTE — Progress Notes (Addendum)
PROGRESS NOTE        PATIENT DETAILS Name: Ian [name] Age: 81 y.o. Sex: male Date of Birth: 01-18-1936 Admit Date: 12/30/2016 Admitting Physician Etta Quill, DO PXT:GGYI, [name] L, DO  Brief Narrative: Patient is a 81 y.o. male with past medical history significant for Type II DM, HTN, CKD, HLD, COPD and Alzheimer's. Surgical history is significant for an exploratory laparotomy and hernia repair. He presented to the Ed at the instruction of his PCP when he started experiencing abdominal discomfort, emesis and decreased bowel movements. CT of the abdomen confirmed dilation of small bowel most likely due to low grade obstruction. He was admitted for management of his SBO but an NG tube was unable to be placed.   Patient was doing well with conservative treatment, but subsequently developed pain and abd xray showed persistent SBO, surgery was consulted and small bowel protocol was initiated. NGT was placed and   Subjective: Feeling much better today,less distended, passed flatus, small BM x 1 today. Tolerating diet well.  WBC up unclear reason.   Assessment/Plan: Small bowel obstruction:  Initial CT abd showed low grade obstruction, subsequently developed full SBO  Surgical hx of Ex lap Conservative treatment  Advance diet per surgical team  Encourage ambulation  Pain management with Tylenol   COPD/Asthma:  Remains stable  Continue current regimen   Diabetes mellitus:  CBG's acceptable  Continue SSI  Holding Tradjenta - will resume upon discharge   AKI on Chronic kidney disease (CKD) stage G3a/A2:  Initial Cr 2.1 - improved with IVF.  sCr at baseline Continue to monitor     Hypertension: BP above goal Patient was on amlodipine and Lopressor scheduled and hydralazine PRN  Will add home dose Hydralazine 25 mg TID  Monitor BP    Leukocytosis  Reactive - no source of infections at this time  Monitor CBC in AM   Hyperlipidemia: Continue  statin and ASA.  DVT Prophylaxis: Prophylactic Lovenox   Code Status: Full   Family Communication: Spouse at bedside  Disposition Plan: Remain inpatient  Antimicrobial agents: Anti-infectives    None      Procedures: None  CONSULTS:  general surgery  Time spent: 15 minutes-Greater than 50% of this time was spent in counseling, explanation of diagnosis, planning of further management, and coordination of care.  MEDICATIONS: Scheduled Meds: . [START ON 01/06/2017] amLODipine  10 mg Oral Daily  . aspirin EC  81 mg Oral Daily  . atorvastatin  20 mg Oral q1800  . bisacodyl  10 mg Rectal BID  . enoxaparin (LOVENOX) injection  40 mg Subcutaneous Daily  . hydrALAZINE  25 mg Oral Q8H  . insulin aspart  0-9 Units Subcutaneous Q4H  . metoprolol tartrate  50 mg Oral BID  . PARoxetine  20 mg Oral Daily   Continuous Infusions:  PRN Meds:.acetaminophen **OR** acetaminophen, albuterol, hydrALAZINE, ipratropium-albuterol, montelukast, ondansetron **OR** ondansetron (ZOFRAN) IV, traZODone   PHYSICAL EXAM: Vital signs: Vitals:   01/04/17 1058 01/04/17 1441 01/04/17 2011 01/05/17 0422  BP: (!) 155/65 (!) 185/64 (!) 163/80 (!) 184/67  Pulse: (!) 59 64 63 66  Resp: 20  20 20   Temp: 98.3 F (36.8 C) 98.2 F (36.8 C) 98.1 F (36.7 C) 98.2 F (36.8 C)  TempSrc: Oral Oral Oral Oral  SpO2: 97% 93% 94% 99%  Weight:  Height:       Filed Weights   12/30/16 1711 12/31/16 0248  Weight: 86.4 kg (190 lb 6.4 oz) 100.2 kg (221 lb)   Body mass index is 34.61 kg/m.   General: NAD, sitting in chair  Cardiovascular: RRR, S1/S2 +, no rubs, no gallops Respiratory: CTA bilaterally, no wheezing, no rhonchi Abdominal: Soft, NT, less distended, bowel sounds + Extremities: no edema, no cyanosis  I have personally reviewed following labs and imaging studies  LABORATORY DATA: CBC:  Recent Labs Lab 12/30/16 1143 12/30/16 1807 01/01/17 0426 01/04/17 0456 01/05/17 0703  WBC  11.2* 11.3* 8.3 9.9 14.3*  NEUTROABS 8,736*  --   --   --   --   HGB 13.7 13.7 11.4* 13.2 14.1  HCT 41.9 41.3 36.4* 39.1 41.4  MCV 96.8 96.5 98.4 94.4 95.4  PLT 186 163 135* 144* 476    Basic Metabolic Panel:  Recent Labs Lab 01/01/17 0426 01/02/17 0346 01/03/17 0831 01/04/17 0456 01/05/17 0703  NA 141 141 143 143 140  K 4.0 3.8 3.4* 3.4* 4.0  CL 114* 111 114* 109 106  CO2 23 24 22 27 24   GLUCOSE 107* 97 121* 145* 123*  BUN 29* 22* 20 15 14   CREATININE 1.79* 1.55* 1.48* 1.41* 1.51*  CALCIUM 8.8* 9.3 9.4 9.7 10.0  MG  --  1.9  --   --   --     GFR: Estimated Creatinine Clearance: 45.5 mL/min (A) (by C-G formula based on SCr of 1.51 mg/dL (H)).  Liver Function Tests:  Recent Labs Lab 12/30/16 1143 12/30/16 1807 01/01/17 0426  AST 15 17 12*  ALT 10 14* 10*  ALKPHOS 53 49 37*  BILITOT 0.5 0.4 0.6  PROT 6.5 6.9 5.1*  ALBUMIN 3.8 3.8 2.7*    Recent Labs Lab 12/30/16 1143 12/30/16 1807  LIPASE 24 28  AMYLASE 46  --    No results for input(s): AMMONIA in the last 168 hours.  Coagulation Profile: No results for input(s): INR, PROTIME in the last 168 hours.  Cardiac Enzymes: No results for input(s): CKTOTAL, CKMB, CKMBINDEX, TROPONINI in the last 168 hours.  BNP (last 3 results) No results for input(s): PROBNP in the last 8760 hours.  HbA1C: No results for input(s): HGBA1C in the last 72 hours.  CBG:  Recent Labs Lab 01/04/17 1713 01/04/17 2009 01/05/17 0015 01/05/17 0420 01/05/17 0732  GLUCAP 135* 129* 93 115* 120*    Lipid Profile: No results for input(s): CHOL, HDL, LDLCALC, TRIG, CHOLHDL, LDLDIRECT in the last 72 hours.  Thyroid Function Tests: No results for input(s): TSH, T4TOTAL, FREET4, T3FREE, THYROIDAB in the last 72 hours.  Anemia Panel: No results for input(s): VITAMINB12, FOLATE, FERRITIN, TIBC, IRON, RETICCTPCT in the last 72 hours.  Urine analysis:    Component Value Date/Time   COLORURINE YELLOW 12/29/2012 1650    APPEARANCEUR CLEAR 12/29/2012 1650   LABSPEC 1.025 12/29/2012 1650   PHURINE 5.5 12/29/2012 1650   GLUCOSEU NEGATIVE 12/29/2012 1650   HGBUR MODERATE (A) 12/29/2012 1650   BILIRUBINUR SMALL (A) 12/29/2012 1650   KETONESUR NEGATIVE 12/29/2012 1650   PROTEINUR >300 (A) 12/29/2012 1650   UROBILINOGEN 1.0 12/29/2012 1650   NITRITE NEGATIVE 12/29/2012 1650   LEUKOCYTESUR NEGATIVE 12/29/2012 1650    Sepsis Labs: Lactic Acid, Venous    Component Value Date/Time   LATICACIDVEN 0.92 12/30/2016 2222    MICROBIOLOGY: No results found for this or any previous visit (from the past 240 hour(s)).  RADIOLOGY STUDIES/RESULTS: Ct  Abdomen Pelvis Wo Contrast  Result Date: 12/31/2016 CLINICAL DATA:  Abdominal pain. EXAM: CT ABDOMEN AND PELVIS WITHOUT CONTRAST TECHNIQUE: Multidetector CT imaging of the abdomen and pelvis was performed following the standard protocol without IV contrast. COMPARISON:  Radiographs 12/30/2016 FINDINGS: Lower chest: No acute abnormality. Hepatobiliary: Several small calculi are present in the gallbladder lumen. No bile duct dilatation. No focal liver lesions. Pancreas: Unremarkable. No pancreatic ductal dilatation or surrounding inflammatory changes. Spleen: Normal in size without focal abnormality. Adrenals/Urinary Tract: Adrenal glands are unremarkable. Kidneys are normal, without renal calculi, focal lesion, or hydronephrosis. Bladder is unremarkable. Stomach/Bowel: Moderate dilatation of stomach and small bowel. Mild small bowel mural thickening and edema. Distal small bowel is relatively decompressed but an abrupt focal caliber transition is not evident. No focal inflammation of bowel. No mass. No pneumatosis. No extraluminal air. Colon is normal. Vascular/Lymphatic: Moderate atherosclerotic calcification. No aneurysm. Nonspecific nodes in the inguinal regions, more likely benign. Reproductive: Unremarkable Other: No ascites. Musculoskeletal: No significant skeletal lesion.  IMPRESSION: 1. Moderate dilatation of small bowel in its proximal and midportions, probably a low-grade obstruction. No mass or hernia is evident. No bowel perforation. 2. Cholelithiasis. 3. Aortic atherosclerosis. Electronically Signed   By: Andreas Newport M.D.   On: 12/31/2016 00:32   Dg Abd 1 View  Result Date: 12/30/2016 CLINICAL DATA:  Nausea, vomiting, and diarrhea for 1 week, history diabetes mellitus, hypertension, COPD EXAM: ABDOMEN - 1 VIEW COMPARISON:  None FINDINGS: Multiple dilated small bowel loops up to 6.3 cm diameter with paucity of colonic gas highly suspicious for small bowel obstruction. No definite bowel wall thickening. Diffuse osseous demineralization. No urinary tract calcification. IMPRESSION: Multiple dilated loops of small bowel with paucity of colonic gas highly suspicious for small bowel obstruction. These results will be called to the ordering clinician or representative by the Radiologist Assistant, and communication documented in the PACS or zVision Dashboard. Electronically Signed   By: Lavonia Dana M.D.   On: 12/30/2016 14:13   Dg Abd 2 Views  Result Date: 01/02/2017 CLINICAL DATA:  81 year old male with the history small bowel obstruction EXAM: ABDOMEN - 2 VIEW COMPARISON:  CT 12/31/2016 FINDINGS: Upright image demonstrates multiple air-fluid levels within the left and right abdomen. Distention of small bowel loops more prominent in the left abdomen. Paucity of colonic gas, compatible with findings on prior CT. No gastric tube visualized. Linear radiopaque structure overlying the liver parenchyma, as was seen on prior CT. No displaced fracture. IMPRESSION: Persisting small bowel obstruction. Electronically Signed   By: Corrie Mckusick D.O.   On: 01/02/2017 08:08   Dg Abd Portable 1v  Result Date: 01/04/2017 CLINICAL DATA:  Abdominal distention.  Small bowel obstruction. EXAM: PORTABLE ABDOMEN - 1 VIEW COMPARISON:  01/02/2017 FINDINGS: Small bowel dilation noted on the  prior exams has essentially resolved. Contrast is seen within a normal caliber colon. Nasogastric tube is positioned within the mid stomach. IMPRESSION: 1. No residual small bowel dilation. Contrast present within a nondilated colon. Findings are consistent with a resolved partial small bowel obstruction. Electronically Signed   By: Lajean Manes M.D.   On: 01/04/2017 10:03   Dg Abd Portable 1v-small Bowel Obstruction Protocol-initial, 8 Hr Delay  Result Date: 01/03/2017 CLINICAL DATA:  Small bowel obstruction EXAM: PORTABLE ABDOMEN - 1 VIEW COMPARISON:  01/02/2017 FINDINGS: Esophageal tube tip projects over the GE junction. All persistent gaseous dilatation of bowel diffusely. There is contrast present within the colon. IMPRESSION: 1. Persistent diffuse increased bowel gas which may  relate to partial obstruction ; there is contrast material present within the colon. Electronically Signed   By: Donavan Foil M.D.   On: 01/03/2017 00:29   Dg Abd Portable 1v-small Bowel Protocol-position Verification  Result Date: 01/02/2017 CLINICAL DATA:  NG tube placement . EXAM: PORTABLE ABDOMEN - 1 VIEW COMPARISON:  01/02/2017 .  CT 12/31/2016 FINDINGS: NG tube noted with tip projected over the stomach. Multiple distended loops of small bowel again noted without significant interim change. Findings suggest small bowel obstruction. Colon is nondistended. No free air. No acute bony abnormality. IMPRESSION: NG tube noted with tip projected over the stomach . Persistent distended loops of small bowel suggesting small bowel obstruction . No change from prior exams. Electronically Signed   By: Marcello Moores  Register   On: 01/02/2017 11:56     LOS: 5 days   Chipper Oman, MD  [facility] If 7PM-7AM, please contact night-coverage [email or web address] Password [email or web address] 01/05/2017, 12:05 PM

## 2017-01-06 LAB — CBC WITH DIFFERENTIAL/PLATELET
Basophils Absolute: 0 10*3/uL (ref 0.0–0.1)
Basophils Relative: 0 %
EOS ABS: 0.4 10*3/uL (ref 0.0–0.7)
Eosinophils Relative: 4 %
HCT: 36.7 % — ABNORMAL LOW (ref 39.0–52.0)
HEMOGLOBIN: 12.2 g/dL — AB (ref 13.0–17.0)
LYMPHS ABS: 2.5 10*3/uL (ref 0.7–4.0)
LYMPHS PCT: 27 %
MCH: 31.6 pg (ref 26.0–34.0)
MCHC: 33.2 g/dL (ref 30.0–36.0)
MCV: 95.1 fL (ref 78.0–100.0)
Monocytes Absolute: 1.1 10*3/uL — ABNORMAL HIGH (ref 0.1–1.0)
Monocytes Relative: 11 %
NEUTROS PCT: 58 %
Neutro Abs: 5.3 10*3/uL (ref 1.7–7.7)
Platelets: 145 10*3/uL — ABNORMAL LOW (ref 150–400)
RBC: 3.86 MIL/uL — AB (ref 4.22–5.81)
RDW: 13.8 % (ref 11.5–15.5)
WBC: 9.3 10*3/uL (ref 4.0–10.5)

## 2017-01-06 LAB — GLUCOSE, CAPILLARY
GLUCOSE-CAPILLARY: 104 mg/dL — AB (ref 65–99)
GLUCOSE-CAPILLARY: 111 mg/dL — AB (ref 65–99)
Glucose-Capillary: 100 mg/dL — ABNORMAL HIGH (ref 65–99)
Glucose-Capillary: 95 mg/dL (ref 65–99)

## 2017-01-06 LAB — BASIC METABOLIC PANEL
Anion gap: 7 (ref 5–15)
BUN: 16 mg/dL (ref 6–20)
CHLORIDE: 107 mmol/L (ref 101–111)
CO2: 27 mmol/L (ref 22–32)
CREATININE: 1.48 mg/dL — AB (ref 0.61–1.24)
Calcium: 9.5 mg/dL (ref 8.9–10.3)
GFR calc non Af Amer: 44 mL/min — ABNORMAL LOW (ref >60)
GFR, EST AFRICAN AMERICAN: 50 mL/min — AB (ref >60)
Glucose, Bld: 106 mg/dL — ABNORMAL HIGH (ref 65–99)
POTASSIUM: 3.6 mmol/L (ref 3.5–5.1)
SODIUM: 141 mmol/L (ref 135–145)

## 2017-01-06 MED ORDER — LOSARTAN POTASSIUM 50 MG PO TABS
50.0000 mg | ORAL_TABLET | Freq: Every day | ORAL | Status: DC
  Administered 2017-01-06: 50 mg via ORAL
  Filled 2017-01-06: qty 1

## 2017-01-06 MED ORDER — DOCUSATE SODIUM 100 MG PO CAPS: 100.0000 mg | ORAL_CAPSULE | Freq: Every day | ORAL | 0 refills | Status: DC

## 2017-01-06 MED ORDER — POLYETHYLENE GLYCOL 3350 17 G PO PACK: 17.0000 g | PACK | Freq: Every day | ORAL | 0 refills | Status: DC | PRN

## 2017-01-06 NOTE — Progress Notes (Signed)
Patient discharged to home with instructions and prescriptions. 

## 2017-01-06 NOTE — Progress Notes (Signed)
[facility] Progress Note     Subjective: CC: Wants to get home Tolerating fulls without n/v, abdominal pain. UOP good, VSS. Had a small BM yesterday.   Objective: Vital signs in last 24 hours: Temp:  [98.3 F (36.8 C)-98.6 F (37 C)] 98.3 F (36.8 C) (07/02 0435) Pulse Rate:  [56-60] 60 (07/02 0435) Resp:  [18-20] 18 (07/02 0435) BP: (154-169)/(66-70) 168/70 (07/02 0435) SpO2:  [92 %-94 %] 92 % (07/02 0435) Last BM Date: 01/05/17  Intake/Output from previous day: 07/01 0701 - 07/02 0700 In: 1280 [P.O.:1280] Out: 201 [Urine:200; Stool:1] Intake/Output this shift: Total I/O In: 120 [P.O.:120] Out: -   PE: Gen:  Alert, NAD, pleasant Card:  Regular rate and rhythm, no M/G/R appreciated Pulm:  Normal effort, clear to auscultation bilaterally Abd: Soft, non-tender, non-distended, bowel sounds present in all 4 quadrants Skin: warm and dry, no rashes  Psych: A&Ox3   Lab Results:   Recent Labs  01/05/17 0703 01/06/17 0511  WBC 14.3* 9.3  HGB 14.1 12.2*  HCT 41.4 36.7*  PLT 176 145*   BMET  Recent Labs  01/05/17 0703 01/06/17 0511  NA 140 141  K 4.0 3.6  CL 106 107  CO2 24 27  GLUCOSE 123* 106*  BUN 14 16  CREATININE 1.51* 1.48*  CALCIUM 10.0 9.5   CMP     Component Value Date/Time   NA 141 01/06/2017 0511   NA 136 (A) 03/25/2016   K 3.6 01/06/2017 0511   CL 107 01/06/2017 0511   CO2 27 01/06/2017 0511   GLUCOSE 106 (H) 01/06/2017 0511   BUN 16 01/06/2017 0511   BUN 29 (A) 03/25/2016   CREATININE 1.48 (H) 01/06/2017 0511   CREATININE 2.11 (H) 12/30/2016 1143   CALCIUM 9.5 01/06/2017 0511   PROT 5.1 (L) 01/01/2017 0426   PROT 6.4 10/05/2015 1135   ALBUMIN 2.7 (L) 01/01/2017 0426   ALBUMIN 3.0 (L) 10/05/2015 1135   AST 12 (L) 01/01/2017 0426   ALT 10 (L) 01/01/2017 0426   ALKPHOS 37 (L) 01/01/2017 0426   BILITOT 0.6 01/01/2017 0426   BILITOT 0.5 10/05/2015 1135   GFRNONAA 44 (L) 01/06/2017 0511   GFRNONAA 29 (L) 12/30/2016 1143    GFRAA 50 (L) 01/06/2017 0511   GFRAA 34 (L) 12/30/2016 1143   Lipase     Component Value Date/Time   LIPASE 28 12/30/2016 1807     Assessment/Plan SBO - abdominal surgical history: ex lap to eval for SBO (intraop no obstruction found, had an ileus) 12/12/2012Dr. Ninfa Linden - admitted 12/30/16 with abdominal pain, nausea, vomiting - XR 6/30 showed contrast in colon and SBO resolving - WBC 9.3, VSS - tolerating fulls and clinically improving - advance diet  COPD DM CKD HTN HLD Alzheimers  FEN - advance to soft diet VTE - SCDs, lovenox ID - no current abx  Plan: Stable for discharge home if tolerating soft diet. Recommend daily colace and PRN miralax at home.   LOS: 6 days    Brigid Re , [facility] Surgery 01/06/2017, 9:45 AM Pager: [phone] Consults: [phone] Mon-Fri 7:00 am-4:30 pm Sat-Sun 7:00 am-11:30 am

## 2017-01-06 NOTE — Discharge Instructions (Signed)
Small Bowel Obstruction A small bowel obstruction means that something is blocking the small bowel. The small bowel is also called the small intestine. It is the long tube that connects the stomach to the colon. An obstruction will stop food and fluids from passing through the small bowel. Treatment depends on what is causing the problem and how bad the problem is. Follow these instructions at home:  Get a lot of rest. ? Follow your diet as told by your doctor.   Take over-the-counter and prescription medicines only as told by your doctor.  Keep all follow-up visits as told by your doctor. This is important. Contact a doctor if:  You have a fever.  You have chills. Get help right away if:  You have pain or cramps that get worse.  You throw up (vomit) blood.  You have a feeling of being sick to your stomach (nausea) that does not go away.  You cannot stop throwing up.  You cannot drink fluids.  You feel confused.  You feel dry or thirsty (dehydrated).  Your belly gets more bloated.  You feel weak or you pass out (faint). This information is not intended to replace advice given to you by your health care provider. Make sure you discuss any questions you have with your health care provider. Document Released: 08/01/2004 Document Revised: 02/19/2016 Document Reviewed: 08/18/2014 Elsevier Interactive Patient Education  [employer].

## 2017-01-06 NOTE — Discharge Summary (Addendum)
Physician Discharge Summary  Ian [name]  JZP:[id number]  DOB: 1936/03/09  DOA: 12/30/2016 PCP: Gayland Curry, DO  Admit date: 12/30/2016 Discharge date: 01/06/2017  Admitted From: Home  Disposition:  Home   Recommendations for Outpatient Follow-up:  1. Follow up with PCP in 1 week  2. Please obtain BMP/CBC in one week to monitor Hgb and renal function 3. Encourage good oral hydration 4. Miralax daily PRN and Colace daily   Discharge Condition: Stable  CODE STATUS: DNR  Diet recommendation: Heart Healthy / Carb Modified   Brief/Interim Summary: Patient is a 81 y.o. male with past medical history significant for Type II DM, HTN, CKD, HLD, COPD and Alzheimer's. Surgical history is significant for an exploratory laparotomy and hernia repair. He presented to the ED at the instruction of his PCP when he started experiencing abdominal discomfort, emesis and decreased bowel movements. CT of the abdomen confirmed dilation of small bowel most likely due to low grade obstruction. He was admitted for management of his low grade SBO but an NG tube was unable to be placed. Patient was treated conservatevly but subsequently patient developed, full SBO, surgery was consulted and small bowel protocol was initiated. NGT was placed and abdominal xray with gastrografin was performed. Patient subsequently, clinically improved, started to pass gas and having small bowel movements. Patient is tolerating diet well, he is less distended and passing flatus with small BM. Patient cleared by surgery for discharge. Patient will be discharged on daily Colace and when necessary MiraLAX, to follow with primary care physician.   Subjective: Patient seen and examined on the day of discharge, patient doing much better continued to tolerate well diet. + flatus and bowel movement. White count is normal. Patient remains afebrile and is asymptomatic.  Discharge Diagnoses/Hospital Course:  Small bowel obstruction: -  resolved Initial CT abd showed low grade obstruction, subsequently developed full SBO  Surgical hx of Ex lap Treated conservatively with nothing by mouth diet and NG tube. Surgical team was consulted which is by conservative treatment Encourage ambulation  Pain management with Tylenol  Daily Colace, MiraLAX when necessary Encourage good hydration  COPD/Asthma:  Stable during hospital stay No changes in medications were made  Diabetes mellitus:  CBGs are stable during hospital stay She was treated with SSI Resume Tradjenta  Follow-up with PCP  AKI on Chronic kidney disease (CKD) stage G3a/A2:  Initial Cr 2.1 - improved with IVF.  sCr upon discharge 1.41 Check BMP 1 week    Hypertension: BP above goal during hospital stay due to nothing by mouth status and abdominal pain Patient will be continue on amlodipine, Lopressor, hydralazine and losartan No changes in doses were made Follow-up with PCP in 1 week to monitor BP and adjust medications as needed.    Leukocytosis - resolved Reactive - no source of infections at this time   Hyperlipidemia: Continue statin and ASA.  All other chronic medical condition were stable during the hospitalization.  On the day of the discharge the patient's vitals were stable, and no other acute medical condition were reported by patient. Patient was felt safe to be discharge to home  Discharge Instructions  You were cared for by a hospitalist during your hospital stay. If you have any questions about your discharge medications or the care you received while you were in the hospital after you are discharged, you can call the unit and asked to speak with the hospitalist on call if the hospitalist that took care of you is not  available. Once you are discharged, your primary care physician will handle any further medical issues. Please note that NO REFILLS for any discharge medications will be authorized once you are discharged, as it is imperative  that you return to your primary care physician (or establish a relationship with a primary care physician if you do not have one) for your aftercare needs so that they can reassess your need for medications and monitor your lab values.  Discharge Instructions    Call MD for:  difficulty breathing, headache or visual disturbances    Complete by:  As directed    Call MD for:  extreme fatigue    Complete by:  As directed    Call MD for:  hives    Complete by:  As directed    Call MD for:  persistant dizziness or light-headedness    Complete by:  As directed    Call MD for:  persistant nausea and vomiting    Complete by:  As directed    Call MD for:  redness, tenderness, or signs of infection (pain, swelling, redness, odor or green/yellow discharge around incision site)    Complete by:  As directed    Call MD for:  severe uncontrolled pain    Complete by:  As directed    Call MD for:  temperature >100.4    Complete by:  As directed    Diet - low sodium heart healthy    Complete by:  As directed    Diet Carb Modified    Complete by:  As directed    Increase activity slowly    Complete by:  As directed      Allergies as of 01/06/2017      Reactions   Ace Inhibitors Cough      Medication List    TAKE these medications   albuterol 108 (90 Base) MCG/ACT inhaler Commonly known as:  PROVENTIL HFA;VENTOLIN HFA Inhale 2 puffs into the lungs every 6 (six) hours as needed for wheezing or shortness of breath.   amLODipine 10 MG tablet Commonly known as:  NORVASC Take 1 tablet (10 mg total) by mouth daily.   aspirin EC 81 MG tablet Take 1 tablet (81 mg total) by mouth daily.   atorvastatin 20 MG tablet Commonly known as:  LIPITOR Take one tablet by mouth once daily for cholesterol   docusate sodium 100 MG capsule Commonly known as:  COLACE Take 1 capsule (100 mg total) by mouth daily.   glucose blood test strip Commonly known as:  ONE TOUCH ULTRA TEST Use as instructed to test  blood sugar once daily dx E119   hydrALAZINE 25 MG tablet Commonly known as:  APRESOLINE Take 1 tablet (25 mg total) by mouth 2 (two) times daily.   ipratropium-albuterol 0.5-2.5 (3) MG/3ML Soln Commonly known as:  DUONEB Take 3 mLs by nebulization every 6 (six) hours as needed (wheezing).   linagliptin 5 MG Tabs tablet Commonly known as:  TRADJENTA TAKE 1 TABLET ONE TIME DAILY TO CONTROL BLOOD SUGAR   losartan 50 MG tablet Commonly known as:  COZAAR Take 1 tablet (50 mg total) by mouth daily.   metoprolol tartrate 50 MG tablet Commonly known as:  LOPRESSOR Take 1 tablet (50 mg total) by mouth 2 (two) times daily.   montelukast 10 MG tablet Commonly known as:  SINGULAIR Take 1 tablet (10 mg total) by mouth at bedtime. What changed:  when to take this  reasons to take this  ONE TOUCH ULTRA 2 w/Device Kit Test blood sugar three times daily DX E11.9   onetouch ultrasoft lancets Use to test blood sugar up to three times daily. Dx E11.9   PARoxetine 20 MG tablet Commonly known as:  PAXIL Take 1 tablet by mouth once daily for depression   polyethylene glycol packet Commonly known as:  MIRALAX Take 17 g by mouth daily as needed.   traZODone 50 MG tablet Commonly known as:  DESYREL Take 1 tablet by mouth at bedtime as needed for sleep      Follow-up Information    [name], [name] L, DO. Schedule an appointment as soon as possible for a visit in 1 week(s).   Specialty:  Geriatric Medicine Why:  Hospital follow up  Contact information: [address] [place] [address] [phone]          Allergies  Allergen Reactions  . Ace Inhibitors Cough    Consultations:  General Surgery    Procedures/Studies: Ct Abdomen Pelvis Wo Contrast  Result Date: 12/31/2016 CLINICAL DATA:  Abdominal pain. EXAM: CT ABDOMEN AND PELVIS WITHOUT CONTRAST TECHNIQUE: Multidetector CT imaging of the abdomen and pelvis was performed following the standard protocol without IV  contrast. COMPARISON:  Radiographs 12/30/2016 FINDINGS: Lower chest: No acute abnormality. Hepatobiliary: Several small calculi are present in the gallbladder lumen. No bile duct dilatation. No focal liver lesions. Pancreas: Unremarkable. No pancreatic ductal dilatation or surrounding inflammatory changes. Spleen: Normal in size without focal abnormality. Adrenals/Urinary Tract: Adrenal glands are unremarkable. Kidneys are normal, without renal calculi, focal lesion, or hydronephrosis. Bladder is unremarkable. Stomach/Bowel: Moderate dilatation of stomach and small bowel. Mild small bowel mural thickening and edema. Distal small bowel is relatively decompressed but an abrupt focal caliber transition is not evident. No focal inflammation of bowel. No mass. No pneumatosis. No extraluminal air. Colon is normal. Vascular/Lymphatic: Moderate atherosclerotic calcification. No aneurysm. Nonspecific nodes in the inguinal regions, more likely benign. Reproductive: Unremarkable Other: No ascites. Musculoskeletal: No significant skeletal lesion. IMPRESSION: 1. Moderate dilatation of small bowel in its proximal and midportions, probably a low-grade obstruction. No mass or hernia is evident. No bowel perforation. 2. Cholelithiasis. 3. Aortic atherosclerosis. Electronically Signed   By: Andreas Newport M.D.   On: 12/31/2016 00:32   Dg Abd 1 View  Result Date: 12/30/2016 CLINICAL DATA:  Nausea, vomiting, and diarrhea for 1 week, history diabetes mellitus, hypertension, COPD EXAM: ABDOMEN - 1 VIEW COMPARISON:  None FINDINGS: Multiple dilated small bowel loops up to 6.3 cm diameter with paucity of colonic gas highly suspicious for small bowel obstruction. No definite bowel wall thickening. Diffuse osseous demineralization. No urinary tract calcification. IMPRESSION: Multiple dilated loops of small bowel with paucity of colonic gas highly suspicious for small bowel obstruction. These results will be called to the ordering  clinician or representative by the Radiologist Assistant, and communication documented in the PACS or zVision Dashboard. Electronically Signed   By: Lavonia Dana M.D.   On: 12/30/2016 14:13   Dg Abd 2 Views  Result Date: 01/02/2017 CLINICAL DATA:  81 year old male with the history small bowel obstruction EXAM: ABDOMEN - 2 VIEW COMPARISON:  CT 12/31/2016 FINDINGS: Upright image demonstrates multiple air-fluid levels within the left and right abdomen. Distention of small bowel loops more prominent in the left abdomen. Paucity of colonic gas, compatible with findings on prior CT. No gastric tube visualized. Linear radiopaque structure overlying the liver parenchyma, as was seen on prior CT. No displaced fracture. IMPRESSION: Persisting small bowel obstruction.  Electronically Signed   By: Corrie Mckusick D.O.   On: 01/02/2017 08:08   Dg Abd Portable 1v  Result Date: 01/04/2017 CLINICAL DATA:  Abdominal distention.  Small bowel obstruction. EXAM: PORTABLE ABDOMEN - 1 VIEW COMPARISON:  01/02/2017 FINDINGS: Small bowel dilation noted on the prior exams has essentially resolved. Contrast is seen within a normal caliber colon. Nasogastric tube is positioned within the mid stomach. IMPRESSION: 1. No residual small bowel dilation. Contrast present within a nondilated colon. Findings are consistent with a resolved partial small bowel obstruction. Electronically Signed   By: Lajean Manes M.D.   On: 01/04/2017 10:03   Dg Abd Portable 1v-small Bowel Obstruction Protocol-initial, 8 Hr Delay  Result Date: 01/03/2017 CLINICAL DATA:  Small bowel obstruction EXAM: PORTABLE ABDOMEN - 1 VIEW COMPARISON:  01/02/2017 FINDINGS: Esophageal tube tip projects over the GE junction. All persistent gaseous dilatation of bowel diffusely. There is contrast present within the colon. IMPRESSION: 1. Persistent diffuse increased bowel gas which may relate to partial obstruction ; there is contrast material present within the colon.  Electronically Signed   By: Donavan Foil M.D.   On: 01/03/2017 00:29   Dg Abd Portable 1v-small Bowel Protocol-position Verification  Result Date: 01/02/2017 CLINICAL DATA:  NG tube placement . EXAM: PORTABLE ABDOMEN - 1 VIEW COMPARISON:  01/02/2017 .  CT 12/31/2016 FINDINGS: NG tube noted with tip projected over the stomach. Multiple distended loops of small bowel again noted without significant interim change. Findings suggest small bowel obstruction. Colon is nondistended. No free air. No acute bony abnormality. IMPRESSION: NG tube noted with tip projected over the stomach . Persistent distended loops of small bowel suggesting small bowel obstruction . No change from prior exams. Electronically Signed   By: Marcello Moores  Register   On: 01/02/2017 11:56    Discharge Exam: Vitals:   01/05/17 2130 01/06/17 0435  BP: (!) 169/66 (!) 168/70  Pulse: 60 60  Resp:  18  Temp: 98.6 F (37 C) 98.3 F (36.8 C)   Vitals:   01/05/17 0422 01/05/17 1307 01/05/17 2130 01/06/17 0435  BP: (!) 184/67 (!) 154/66 (!) 169/66 (!) 168/70  Pulse: 66 (!) 56 60 60  Resp: _0 Temp: 98.2 F (36.8 C) 98.5 F (36.9 C) 98.6 F (37 C) 98.3 F (36.8 C)  TempSrc: Oral Oral Oral Oral  SpO2: 99% 94% 92% 92%  Weight:      Height:        General: Pt is alert, awake, not in acute distress Cardiovascular: RRR, S1/S2 +, no rubs, no gallops Respiratory: CTA bilaterally, no wheezing, no rhonchi Abdominal: Soft, NT, ND, bowel sounds + Extremities: no edema, no cyanosis   The results of significant diagnostics from this hospitalization (including imaging, microbiology, ancillary and laboratory) are listed below for reference.     Microbiology: No results found for this or any previous visit (from the past 240 hour(s)).   Labs: BNP (last 3 results) No results for input(s): BNP in the last 8760 hours. Basic Metabolic Panel:  Recent Labs Lab 01/02/17 0346 01/03/17 0831 01/04/17 0456 01/05/17 0703  01/06/17 0511  NA 141 143 143 140 141  K 3.8 3.4* 3.4* 4.0 3.6  CL 111 114* 109 106 107  CO2 _1 GLUCOSE 97 121* 145* 123* 106*  BUN 22* _2 CREATININE 1.55* 1.48* 1.41* 1.51* 1.48*  CALCIUM 9.3 9.4 9.7 10.0 9.5  MG 1.9  --   --   --   --  Liver Function Tests:  Recent Labs Lab 12/30/16 1143 12/30/16 1807 01/01/17 0426  AST 15 17 12*  ALT 10 14* 10*  ALKPHOS 53 49 37*  BILITOT 0.5 0.4 0.6  PROT 6.5 6.9 5.1*  ALBUMIN 3.8 3.8 2.7*    Recent Labs Lab 12/30/16 1143 12/30/16 1807  LIPASE 24 28  AMYLASE 46  --    No results for input(s): AMMONIA in the last 168 hours. CBC:  Recent Labs Lab 12/30/16 1143 12/30/16 1807 01/01/17 0426 01/04/17 0456 01/05/17 0703 01/06/17 0511  WBC 11.2* 11.3* 8.3 9.9 14.3* 9.3  NEUTROABS 8,736*  --   --   --   --  5.3  HGB 13.7 13.7 11.4* 13.2 14.1 12.2*  HCT 41.9 41.3 36.4* 39.1 41.4 36.7*  MCV 96.8 96.5 98.4 94.4 95.4 95.1  PLT 186 163 135* 144* 176 145*   Cardiac Enzymes: No results for input(s): CKTOTAL, CKMB, CKMBINDEX, TROPONINI in the last 168 hours. BNP: Invalid input(s): POCBNP CBG:  Recent Labs Lab 01/05/17 1615 01/05/17 2008 01/06/17 0020 01/06/17 0437 01/06/17 0740  GLUCAP 105* 115* 95 100* 104*   Urinalysis    Component Value Date/Time   COLORURINE YELLOW 12/29/2012 1650   APPEARANCEUR CLEAR 12/29/2012 1650   LABSPEC 1.025 12/29/2012 1650   PHURINE 5.5 12/29/2012 1650   GLUCOSEU NEGATIVE 12/29/2012 1650   HGBUR MODERATE (A) 12/29/2012 1650   BILIRUBINUR SMALL (A) 12/29/2012 1650   KETONESUR NEGATIVE 12/29/2012 1650   PROTEINUR >300 (A) 12/29/2012 1650   UROBILINOGEN 1.0 12/29/2012 1650   NITRITE NEGATIVE 12/29/2012 1650   LEUKOCYTESUR NEGATIVE 12/29/2012 1650   Sepsis Labs Invalid input(s): PROCALCITONIN,  WBC,  LACTICIDVEN Microbiology No results found for this or any previous visit (from the past 240 hour(s)).   Time coordinating discharge: 35  minutes  SIGNED:  Chipper Oman, MD  [facility] 01/06/2017, 11:03 AM  Pager please text page via  [email or web address] Password [email or web address]

## 2017-01-06 NOTE — Care Management Important Message (Signed)
Important Message  Patient Details  Name: Ian [name] MRN: [id number] Date of Birth: [birth year]   Medicare Important Message Given:  Yes    [name] 01/06/2017, 3:11 PM

## 2017-01-09 ENCOUNTER — Telehealth: Payer: Self-pay

## 2017-01-10 ENCOUNTER — Encounter: Payer: Self-pay | Admitting: Nurse Practitioner

## 2017-01-10 ENCOUNTER — Ambulatory Visit (INDEPENDENT_AMBULATORY_CARE_PROVIDER_SITE_OTHER): Payer: PPO | Admitting: Nurse Practitioner

## 2017-01-10 VITALS — BP 110/74 | HR 62 | Temp 97.8°F | Resp 17 | Ht 67.0 in | Wt 183.6 lb

## 2017-01-10 DIAGNOSIS — D649 Anemia, unspecified: Secondary | ICD-10-CM | POA: Diagnosis not present

## 2017-01-10 DIAGNOSIS — N183 Chronic kidney disease, stage 3 unspecified: Secondary | ICD-10-CM

## 2017-01-10 DIAGNOSIS — E1122 Type 2 diabetes mellitus with diabetic chronic kidney disease: Secondary | ICD-10-CM

## 2017-01-10 DIAGNOSIS — I10 Essential (primary) hypertension: Secondary | ICD-10-CM

## 2017-01-10 DIAGNOSIS — N1831 Chronic kidney disease, stage 3a: Secondary | ICD-10-CM

## 2017-01-10 DIAGNOSIS — K56609 Unspecified intestinal obstruction, unspecified as to partial versus complete obstruction: Secondary | ICD-10-CM

## 2017-01-10 LAB — BASIC METABOLIC PANEL WITH GFR
BUN: 24 mg/dL (ref 7–25)
CHLORIDE: 104 mmol/L (ref 98–110)
CO2: 27 mmol/L (ref 20–31)
CREATININE: 1.74 mg/dL — AB (ref 0.70–1.18)
Calcium: 9.2 mg/dL (ref 8.6–10.3)
GFR, Est African American: 42 mL/min — ABNORMAL LOW (ref >=60)
GFR, Est Non African American: 37 mL/min — ABNORMAL LOW (ref >=60)
Glucose, Bld: 103 mg/dL — ABNORMAL HIGH (ref 65–99)
POTASSIUM: 4.1 mmol/L (ref 3.5–5.3)
SODIUM: 140 mmol/L (ref 135–146)

## 2017-01-10 LAB — CBC WITH DIFFERENTIAL/PLATELET
BASOS ABS: 100 {cells}/uL (ref 0–200)
BASOS PCT: 1 %
EOS ABS: 300 {cells}/uL (ref 15–500)
Eosinophils Relative: 3 %
HCT: 36.9 % — ABNORMAL LOW (ref 38.5–50.0)
HEMOGLOBIN: 12.3 g/dL — AB (ref 13.2–17.1)
LYMPHS ABS: 2300 {cells}/uL (ref 850–3900)
Lymphocytes Relative: 23 %
MCH: 32 pg (ref 27.0–33.0)
MCHC: 33.3 g/dL (ref 32.0–36.0)
MCV: 96.1 fL (ref 80.0–100.0)
MPV: 9.7 fL (ref 7.5–12.5)
Monocytes Absolute: 900 cells/uL (ref 200–950)
Monocytes Relative: 9 %
NEUTROS ABS: 6400 {cells}/uL (ref 1500–7800)
Neutrophils Relative %: 64 %
Platelets: 215 10*3/uL (ref 140–400)
RBC: 3.84 MIL/uL — ABNORMAL LOW (ref 4.20–5.80)
RDW: 14 % (ref 11.0–15.0)
WBC: 10 10*3/uL (ref 3.8–10.8)

## 2017-01-10 NOTE — Progress Notes (Signed)
  Careteam: Patient Care Team: [name], [name] L, DO as PCP - General (Geriatric Medicine) Kurup, Shree K., MD as Referring Physician (Ophthalmology) Sood, Vineet, MD as Consulting Physician (Pulmonary Disease)  Advanced Directive information Does Patient Have a Medical Advance Directive?: Yes, Type of Advance Directive: Healthcare Power of Attorney;Living will  Allergies  Allergen Reactions  . Ace Inhibitors Cough    Chief Complaint  Patient presents with  . Transitions Of Care    Pt was recently discharged from [facility] after stay 6/25 to 7/2 due to small bowel obstruction.      HPI: Patient is a 80 y.o. male seen in the office today for hospital follow up. past medical history significant for Type II DM, HTN, CKD, HLD, COPD and Alzheimer's. Surgical history is significant for an exploratory laparotomy and hernia repair. Pt went to ED after KUB revealed low grade SBO.  CT of the abdomen confirmed dilation of small bowel most likely due to low grade obstruction. He was admitted for management of his low grade SBO but an NG tube was unable to be placed. Patient was treated conservatevly but subsequently patient developed, full SBO, surgery was consulted and small bowel protocol was initiated. NGT was placed and abdominal xray with gastrografin was performed. Patient subsequently, clinically improved, started to pass gas and having small bowel movements. Patient is tolerating diet well, he is less distended and passing flatus with small BM. Patient cleared by surgery for discharge. Patient was discharged on daily Colace and when necessary MiraLAX.  Blood pressure was above goal during hospitalization. Today it is good at 110/74 Pt reports increase fatigued since he has left the hospital but overall much better. Driving around. Eating well.    Review of Systems:  Review of Systems  Constitutional: Negative for chills, fever and malaise/fatigue.  HENT: Negative for congestion and hearing  loss.   Eyes: Negative for blurred vision.       Glasses  Respiratory: Negative for shortness of breath.   Cardiovascular: Negative for chest pain, palpitations and leg swelling.  Gastrointestinal: Negative for abdominal pain, blood in stool, constipation, diarrhea, heartburn, melena, nausea and vomiting.  Genitourinary: Negative for dysuria.  Musculoskeletal: Negative for falls and joint pain.  Skin: Negative for itching and rash.  Neurological: Negative for dizziness, loss of consciousness and weakness.  Psychiatric/Behavioral: Positive for memory loss. Negative for depression. The patient is not nervous/anxious and does not have insomnia.     Past Medical History:  Diagnosis Date  . Allergy   . Alzheimer's disease   . Anemia, unspecified   . Anxiety   . Chronic maxillary sinusitis   . COPD (chronic obstructive pulmonary disease) (HCC)   . Depression   . Diabetes mellitus   . Edema   . Hypercalcemia   . Hyperlipidemia   . Hypertension   . Hypertrophy of prostate without urinary obstruction and other lower urinary tract symptoms (LUTS)   . Hypertrophy of prostate without urinary obstruction and other lower urinary tract symptoms (LUTS)   . Kidney disease    mild  . Unspecified disorder of kidney and ureter   . Unspecified sleep apnea   . Ventral hernia, unspecified, without mention of obstruction or gangrene    Past Surgical History:  Procedure Laterality Date  . EXPLORATORY LAPAROTOMY  2002  . HERNIA REPAIR     Social History:   reports that he quit smoking about 28 years ago. He quit after 30.00 years of use. He has never used smokeless   tobacco. He reports that he does not drink alcohol or use drugs.  Family History  Problem Relation Age of Onset  . Pancreatic cancer Sister   . Heart disease Father   . Hypertension Brother   . Hypertension Sister     Medications: Patient's Medications  New Prescriptions   No medications on file  Previous Medications    ALBUTEROL (PROVENTIL HFA;VENTOLIN HFA) 108 (90 BASE) MCG/ACT INHALER    Inhale 2 puffs into the lungs every 6 (six) hours as needed for wheezing or shortness of breath.   AMLODIPINE (NORVASC) 10 MG TABLET    Take 1 tablet (10 mg total) by mouth daily.   ASPIRIN EC 81 MG TABLET    Take 1 tablet (81 mg total) by mouth daily.   ATORVASTATIN (LIPITOR) 20 MG TABLET    Take one tablet by mouth once daily for cholesterol   BLOOD GLUCOSE MONITORING SUPPL (ONE TOUCH ULTRA 2) W/DEVICE KIT    Test blood sugar three times daily DX E11.9   DOCUSATE SODIUM (COLACE) 100 MG CAPSULE    Take 1 capsule (100 mg total) by mouth daily.   GLUCOSE BLOOD (ONE TOUCH ULTRA TEST) TEST STRIP    Use as instructed to test blood sugar once daily dx E119   HYDRALAZINE (APRESOLINE) 25 MG TABLET    Take 1 tablet (25 mg total) by mouth 2 (two) times daily.   IPRATROPIUM-ALBUTEROL (DUONEB) 0.5-2.5 (3) MG/3ML SOLN    Take 3 mLs by nebulization every 6 (six) hours as needed (wheezing).   LANCETS (ONETOUCH ULTRASOFT) LANCETS    Use to test blood sugar up to three times daily. Dx E11.9   LINAGLIPTIN (TRADJENTA) 5 MG TABS TABLET    TAKE 1 TABLET ONE TIME DAILY TO CONTROL BLOOD SUGAR   LOSARTAN (COZAAR) 50 MG TABLET    Take 1 tablet (50 mg total) by mouth daily.   METOPROLOL (LOPRESSOR) 50 MG TABLET    Take 1 tablet (50 mg total) by mouth 2 (two) times daily.   MONTELUKAST (SINGULAIR) 10 MG TABLET    Take 1 tablet (10 mg total) by mouth at bedtime.   PAROXETINE (PAXIL) 20 MG TABLET    Take 1 tablet by mouth once daily for depression   POLYETHYLENE GLYCOL (MIRALAX) PACKET    Take 17 g by mouth daily as needed.   TRAZODONE (DESYREL) 50 MG TABLET    Take 1 tablet by mouth at bedtime as needed for sleep  Modified Medications   No medications on file  Discontinued Medications   No medications on file     Physical Exam:  Vitals:   01/10/17 0952  BP: 110/74  Pulse: 62  Resp: 17  Temp: 97.8 F (36.6 C)  TempSrc: Oral  SpO2: 96%    Weight: 183 lb 9.6 oz (83.3 kg)  Height: 5' 7" (1.702 m)   Body mass index is 28.76 kg/m.  Physical Exam  Constitutional: He is oriented to person, place, and time. He appears well-developed and well-nourished. No distress.  HENT:  Head: Normocephalic and atraumatic.  Cardiovascular: Normal rate, regular rhythm and normal heart sounds.   Pulmonary/Chest: Effort normal and breath sounds normal. No respiratory distress.  Abdominal: Soft. Bowel sounds are normal. There is no tenderness. A hernia (from exp lap scar at midline) is present.  Musculoskeletal: Normal range of motion.  Neurological: He is alert and oriented to person, place, and time.  Some short term memory loss  Skin: Skin is warm and dry.    Psychiatric: He has a normal mood and affect.   Labs reviewed: Basic Metabolic Panel:  Recent Labs  01/02/17 0346  01/04/17 0456 01/05/17 0703 01/06/17 0511  NA 141  < > 143 140 141  K 3.8  < > 3.4* 4.0 3.6  CL 111  < > 109 106 107  CO2 24  < > _0 GLUCOSE 97  < > 145* 123* 106*  BUN 22*  < > _1 CREATININE 1.55*  < > 1.41* 1.51* 1.48*  CALCIUM 9.3  < > 9.7 10.0 9.5  MG 1.9  --   --   --   --   < > = values in this interval not displayed. Liver Function Tests:  Recent Labs  12/30/16 1143 12/30/16 1807 01/01/17 0426  AST 15 17 12*  ALT 10 14* 10*  ALKPHOS 53 49 37*  BILITOT 0.5 0.4 0.6  PROT 6.5 6.9 5.1*  ALBUMIN 3.8 3.8 2.7*    Recent Labs  12/30/16 1143 12/30/16 1807  LIPASE 24 28  AMYLASE 46  --    No results for input(s): AMMONIA in the last 8760 hours. CBC:  Recent Labs  02/23/16 1031  12/30/16 1143  01/04/17 0456 01/05/17 0703 01/06/17 0511  WBC 7.5  < > 11.2*  < > 9.9 14.3* 9.3  NEUTROABS 3,675  --  8,736*  --   --   --  5.3  HGB 13.1*  < > 13.7  < > 13.2 14.1 12.2*  HCT 39.8  < > 41.9  < > 39.1 41.4 36.7*  MCV 95.0  --  96.8  < > 94.4 95.4 95.1  PLT 154  < > 186  < > 144* 176 145*  < > = values in this interval not  displayed. Lipid Panel:  Recent Labs  02/23/16 1031 06/03/16 0950 10/30/16 0844  CHOL 172 170 154  HDL 47 48 39*  LDLCALC 96 99 94  TRIG 144 114 103  CHOLHDL 3.7 3.5 3.9   TSH: No results for input(s): TSH in the last 8760 hours. A1C: Lab Results  Component Value Date   HGBA1C 5.7 (H) 10/30/2016     Assessment/Plan 1. SBO (small bowel obstruction) (HCC) Resolved. Some tenderness to abdomen but much improved. No N/V/D. Notes bowels are still not moving as well as he would like on colace alone. To add miralax daily and to decrease to every other day if needed  2. Type 2 diabetes mellitus with stage 3 chronic kidney disease, without long-term current use of insulin (HCC) Stable. Has A1c scheduled for later this month. Will cont current regimen.   3. Chronic kidney disease (CKD) stage G3a/A2, moderately decreased glomerular filtration rate (GFR) between 45-59 mL/min/1.73 square meter and albuminuria creatinine ratio between 30-299 mg/g Acute on chronic kidney disease noted during hospitalization. Pt to increase hydration. Will follow up labs. - BMP with eGFR  4. Hypertension, unspecified type -improved. At goal today. Will cont current regimen  5. Anemia, unspecified type -noted during hospitalization. Will follow up today - CBC with Differential/Platelets   Carlos American. Harle Battiest  [facility] [phone] 8 am - 5 pm) [phone] (after hours)

## 2017-01-10 NOTE — Patient Instructions (Signed)
Take Miralax daily for constipation If stools become loose decrease to every other day Increase hydration.

## 2017-01-14 NOTE — Addendum Note (Signed)
Addended by: Lauree Chandler on: 01/14/2017 09:07 AM   Modules accepted: Level of Service

## 2017-01-16 DIAGNOSIS — H25813 Combined forms of age-related cataract, bilateral: Secondary | ICD-10-CM | POA: Diagnosis not present

## 2017-01-16 DIAGNOSIS — E119 Type 2 diabetes mellitus without complications: Secondary | ICD-10-CM | POA: Diagnosis not present

## 2017-01-16 DIAGNOSIS — H353132 Nonexudative age-related macular degeneration, bilateral, intermediate dry stage: Secondary | ICD-10-CM | POA: Diagnosis not present

## 2017-01-16 DIAGNOSIS — H43811 Vitreous degeneration, right eye: Secondary | ICD-10-CM | POA: Diagnosis not present

## 2017-01-16 DIAGNOSIS — H348322 Tributary (branch) retinal vein occlusion, left eye, stable: Secondary | ICD-10-CM | POA: Diagnosis not present

## 2017-01-30 ENCOUNTER — Other Ambulatory Visit: Payer: PPO

## 2017-01-30 DIAGNOSIS — N183 Chronic kidney disease, stage 3 unspecified: Secondary | ICD-10-CM

## 2017-01-30 DIAGNOSIS — E1122 Type 2 diabetes mellitus with diabetic chronic kidney disease: Secondary | ICD-10-CM | POA: Diagnosis not present

## 2017-01-30 LAB — BASIC METABOLIC PANEL
BUN: 31 mg/dL — ABNORMAL HIGH (ref 7–25)
CO2: 21 mmol/L (ref 20–31)
Calcium: 9.5 mg/dL (ref 8.6–10.3)
Chloride: 109 mmol/L (ref 98–110)
Creat: 1.69 mg/dL — ABNORMAL HIGH (ref 0.70–1.18)
Glucose, Bld: 119 mg/dL — ABNORMAL HIGH (ref 65–99)
Potassium: 4.3 mmol/L (ref 3.5–5.3)
Sodium: 141 mmol/L (ref 135–146)

## 2017-01-30 NOTE — Telephone Encounter (Signed)
Forwarded to other [facility] for follow up call. Closing encounter.

## 2017-01-31 LAB — HEMOGLOBIN A1C
Hgb A1c MFr Bld: 5.7 % — ABNORMAL HIGH (ref <5.7)
Mean Plasma Glucose: 117 mg/dL

## 2017-02-03 ENCOUNTER — Encounter: Payer: Self-pay | Admitting: Internal Medicine

## 2017-02-03 ENCOUNTER — Ambulatory Visit (INDEPENDENT_AMBULATORY_CARE_PROVIDER_SITE_OTHER): Payer: PPO | Admitting: Internal Medicine

## 2017-02-03 VITALS — BP 130/76 | HR 50 | Temp 98.4°F | Wt 187.0 lb

## 2017-02-03 DIAGNOSIS — N183 Chronic kidney disease, stage 3 unspecified: Secondary | ICD-10-CM

## 2017-02-03 DIAGNOSIS — J449 Chronic obstructive pulmonary disease, unspecified: Secondary | ICD-10-CM

## 2017-02-03 DIAGNOSIS — E785 Hyperlipidemia, unspecified: Secondary | ICD-10-CM

## 2017-02-03 DIAGNOSIS — E1169 Type 2 diabetes mellitus with other specified complication: Secondary | ICD-10-CM | POA: Diagnosis not present

## 2017-02-03 DIAGNOSIS — I10 Essential (primary) hypertension: Secondary | ICD-10-CM | POA: Diagnosis not present

## 2017-02-03 DIAGNOSIS — K5904 Chronic idiopathic constipation: Secondary | ICD-10-CM | POA: Diagnosis not present

## 2017-02-03 DIAGNOSIS — E1122 Type 2 diabetes mellitus with diabetic chronic kidney disease: Secondary | ICD-10-CM

## 2017-02-03 DIAGNOSIS — Z8719 Personal history of other diseases of the digestive system: Secondary | ICD-10-CM | POA: Diagnosis not present

## 2017-02-03 DIAGNOSIS — J4489 Other specified chronic obstructive pulmonary disease: Secondary | ICD-10-CM

## 2017-02-03 DIAGNOSIS — E669 Obesity, unspecified: Secondary | ICD-10-CM | POA: Diagnosis not present

## 2017-02-03 MED ORDER — POLYETHYLENE GLYCOL 3350 17 GM/SCOOP PO POWD: 17.0000 g | ORAL | 3 refills | Status: DC

## 2017-02-03 NOTE — Progress Notes (Signed)
Location:  [facility] clinic Provider:  Benjamim Harnish L. Mariea Clonts, D.O., C.M.D.  Code Status: DNR Goals of Care:  Advanced Directives 02/03/2017  Does Patient Have a Medical Advance Directive? Yes  Type of Paramedic of [place];Living will  Does patient want to make changes to medical advance directive? -  Copy of [place] in Chart? Yes  Pre-existing out of facility DNR order (yellow form or pink MOST form) -   Chief Complaint  Patient presents with  . Medical Management of Chronic Issues    62mth follow-up    HPI: Patient is a 81 y.o. male seen today for medical management of chronic diseases.    He had a SBO early this month.  Bowels are moving well now.  He thought he might die there at the hospital.  Had to have an NGT and it took 3 times.  He had a 13 hr wait in the ED that was "awful".  Going every 3-4 days at best.  Taking the colace.  Takes the miralax once a week--discussed trying to take it twice a week.  He says "just take me home if I have to go through that again."  He is back to the gym.  He's doing a lot of walking and some light weights.  Was lifting heavier weights before hospitalization.  No sob, wheezing, coughing.  Once in a while, he uses the proventil to keep him straight--once a month only.  No aches or pains.   Stays in good spirits.  Pretty much sleeping well at night unless wakes up from bad dreams.    DMII:  Sugar average unchanged despite hospitalization.  Tries to keep it in the lower 100s.  He had some lows when sick--remembers one of 89 and ate something to bring it up.  hba1c 5.7.    CKD:  Cr improved slightly from 3 wks ago at 1.69 now.    Past Medical History:  Diagnosis Date  . Allergy   . Alzheimer's disease   . Anemia, unspecified   . Anxiety   . Chronic maxillary sinusitis   . COPD (chronic obstructive pulmonary disease) ([place])   . Depression   . Diabetes mellitus   . Edema   . Hypercalcemia   .  Hyperlipidemia   . Hypertension   . Hypertrophy of prostate without urinary obstruction and other lower urinary tract symptoms (LUTS)   . Hypertrophy of prostate without urinary obstruction and other lower urinary tract symptoms (LUTS)   . Kidney disease    mild  . Unspecified disorder of kidney and ureter   . Unspecified sleep apnea   . Ventral hernia, unspecified, without mention of obstruction or gangrene     Past Surgical History:  Procedure Laterality Date  . EXPLORATORY LAPAROTOMY  2002  . HERNIA REPAIR      Allergies  Allergen Reactions  . Ace Inhibitors Cough    Allergies as of 02/03/2017      Reactions   Ace Inhibitors Cough      Medication List       Accurate as of 02/03/17 11:27 AM. Always use your most recent med list.          albuterol 108 (90 Base) MCG/ACT inhaler Commonly known as:  PROVENTIL HFA;VENTOLIN HFA Inhale 2 puffs into the lungs every 6 (six) hours as needed for wheezing or shortness of breath.   amLODipine 10 MG tablet Commonly known as:  NORVASC Take 1 tablet (10 mg total)  by mouth daily.   aspirin EC 81 MG tablet Take 1 tablet (81 mg total) by mouth daily.   atorvastatin 20 MG tablet Commonly known as:  LIPITOR Take one tablet by mouth once daily for cholesterol   docusate sodium 100 MG capsule Commonly known as:  COLACE Take 1 capsule (100 mg total) by mouth daily.   glucose blood test strip Commonly known as:  ONE TOUCH ULTRA TEST Use as instructed to test blood sugar once daily dx E119   hydrALAZINE 25 MG tablet Commonly known as:  APRESOLINE Take 1 tablet (25 mg total) by mouth 2 (two) times daily.   ipratropium-albuterol 0.5-2.5 (3) MG/3ML Soln Commonly known as:  DUONEB Take 3 mLs by nebulization every 6 (six) hours as needed (wheezing).   linagliptin 5 MG Tabs tablet Commonly known as:  TRADJENTA TAKE 1 TABLET ONE TIME DAILY TO CONTROL BLOOD SUGAR   losartan 25 MG tablet Commonly known as:  COZAAR Take 25 mg by  mouth daily.   metoprolol tartrate 50 MG tablet Commonly known as:  LOPRESSOR Take 1 tablet (50 mg total) by mouth 2 (two) times daily.   montelukast 10 MG tablet Commonly known as:  SINGULAIR Take 1 tablet (10 mg total) by mouth at bedtime.   onetouch ultrasoft lancets Use to test blood sugar up to three times daily. Dx E11.9   PARoxetine 20 MG tablet Commonly known as:  PAXIL Take 1 tablet by mouth once daily for depression   polyethylene glycol packet Commonly known as:  MIRALAX Take 17 g by mouth daily as needed.   traZODone 50 MG tablet Commonly known as:  DESYREL Take 1 tablet by mouth at bedtime as needed for sleep       Review of Systems:  Review of Systems  Constitutional: Negative for chills and fever.  HENT: Positive for hearing loss. Negative for congestion.        Bilateral hearing aids  Eyes: Negative for blurred vision.       Glasses  Respiratory: Positive for wheezing. Negative for cough, sputum production and shortness of breath.   Cardiovascular: Negative for chest pain, palpitations and leg swelling.  Gastrointestinal: Positive for constipation. Negative for abdominal pain, blood in stool, diarrhea, melena, nausea and vomiting.  Genitourinary: Negative for dysuria.  Musculoskeletal: Negative for falls and joint pain.  Skin: Negative for itching and rash.  Neurological: Negative for dizziness and loss of consciousness.  Endo/Heme/Allergies: Does not bruise/bleed easily.  Psychiatric/Behavioral: Positive for memory loss. Negative for depression. The patient is not nervous/anxious and does not have insomnia.        Some nightmares recently    Health Maintenance  Topic Date Due  . OPHTHALMOLOGY EXAM  08/27/2015  . INFLUENZA VACCINE  02/05/2017  . FOOT EXAM  06/03/2017  . HEMOGLOBIN A1C  08/02/2017  . TETANUS/TDAP  08/11/2021  . PNA vac Low Risk Adult  Completed    Physical Exam: Vitals:   02/03/17 1056  BP: 130/76  Pulse: (!) 50  Temp: 98.4  F (36.9 C)  TempSrc: Oral  SpO2: 94%  Weight: 187 lb (84.8 kg)   Body mass index is 29.29 kg/m. Physical Exam  Constitutional: He is oriented to person, place, and time. He appears well-developed and well-nourished. No distress.  Cardiovascular: Normal rate, regular rhythm, normal heart sounds and intact distal pulses.   Pulmonary/Chest: Effort normal and breath sounds normal. No respiratory distress.  Abdominal: Soft. Bowel sounds are normal. He exhibits no distension. There is  no tenderness.  Musculoskeletal: Normal range of motion.  Neurological: He is alert and oriented to person, place, and time.  Short term memory loss  Skin: Skin is warm and dry. Capillary refill takes less than 2 seconds.  Psychiatric: He has a normal mood and affect.    Labs reviewed: Basic Metabolic Panel:  Recent Labs  01/02/17 0346  01/06/17 0511 01/10/17 1007 01/30/17 0807  NA 141  < > 141 140 141  K 3.8  < > 3.6 4.1 4.3  CL 111  < > 107 104 109  CO2 24  < > 27 27 21   GLUCOSE 97  < > 106* 103* 119*  BUN 22*  < > 16 24 31*  CREATININE 1.55*  < > 1.48* 1.74* 1.69*  CALCIUM 9.3  < > 9.5 9.2 9.5  MG 1.9  --   --   --   --   < > = values in this interval not displayed. Liver Function Tests:  Recent Labs  12/30/16 1143 12/30/16 1807 01/01/17 0426  AST 15 17 12*  ALT 10 14* 10*  ALKPHOS 53 49 37*  BILITOT 0.5 0.4 0.6  PROT 6.5 6.9 5.1*  ALBUMIN 3.8 3.8 2.7*    Recent Labs  12/30/16 1143 12/30/16 1807  LIPASE 24 28  AMYLASE 46  --    No results for input(s): AMMONIA in the last 8760 hours. CBC:  Recent Labs  12/30/16 1143  01/05/17 0703 01/06/17 0511 01/10/17 1007  WBC 11.2*  < > 14.3* 9.3 10.0  NEUTROABS 8,736*  --   --  5.3 6,400  HGB 13.7  < > 14.1 12.2* 12.3*  HCT 41.9  < > 41.4 36.7* 36.9*  MCV 96.8  < > 95.4 95.1 96.1  PLT 186  < > 176 145* 215  < > = values in this interval not displayed. Lipid Panel:  Recent Labs  02/23/16 1031 06/03/16 0950  10/30/16 0844  CHOL 172 170 154  HDL 47 48 39*  LDLCALC 96 99 94  TRIG 144 114 103  CHOLHDL 3.7 3.5 3.9   Lab Results  Component Value Date   HGBA1C 5.7 (H) 01/30/2017    Assessment/Plan 1. History of small bowel obstruction - cont colace and increase miralax to twice a week, hold for loose stools - polyethylene glycol powder (GLYCOLAX/MIRALAX) powder; Take 17 g by mouth every 3 (three) days. For constipation; hold for loose stools  Dispense: 850 g; Refill: 3  2. Chronic idiopathic constipation - cont colace - polyethylene glycol powder (GLYCOLAX/MIRALAX) powder; Take 17 g by mouth every 3 (three) days. For constipation; hold for loose stools  Dispense: 850 g; Refill: 3  3. Type 2 diabetes mellitus with stage 3 chronic kidney disease, without long-term current use of insulin (HCC) - cont tradjenta (one month samples provided), cozaar, statin, baby asa - Hemoglobin A1c; Future  4. Hypertension, unspecified type -bp at goal with current therapy, cont same and monitor  5. COPD with asthma ([place]) -cont albuterol prn, duonebs prn (not needed recently), singulair  6. Hyperlipidemia associated with type 2 diabetes mellitus (HCC) - cont statin therapy,  LDL goal <70 and remains higher than that, plan to increase lipitor at a future visit if ldl remains over 70 - Lipid panel; Future  7. Obesity (BMI 30-39.9) -cont exercise and healthy diet - Basic metabolic panel; Future - Hemoglobin A1c; Future - Lipid panel; Future  Labs/tests ordered:   Orders Placed This Encounter  Procedures  . Basic  metabolic panel    Standing Status:   Future    Standing Expiration Date:   10/04/2017    Order Specific Question:   Has the patient fasted?    Answer:   Yes  . Hemoglobin A1c    Standing Status:   Future    Standing Expiration Date:   10/04/2017  . Lipid panel    Standing Status:   Future    Standing Expiration Date:   10/04/2017    Order Specific Question:   Has the patient fasted?     Answer:   Yes   Next appt:  06/09/2017  Brylee Berk L. Tyrihanna Wingert, D.O. [facility] [address], [place] [address] Cell Phone (Mon-Fri 8am-5pm):  [phone] On Call:  [phone] & follow prompts after 5pm & weekends Office Phone:  [phone] Office Fax:  [phone]

## 2017-03-06 ENCOUNTER — Other Ambulatory Visit: Payer: Self-pay | Admitting: *Deleted

## 2017-03-06 MED ORDER — GLUCOSE BLOOD VI STRP: ORAL_STRIP | 11 refills | Status: DC

## 2017-03-06 NOTE — Telephone Encounter (Signed)
Patient requested 

## 2017-04-07 ENCOUNTER — Ambulatory Visit (INDEPENDENT_AMBULATORY_CARE_PROVIDER_SITE_OTHER): Payer: PPO | Admitting: Nurse Practitioner

## 2017-04-07 ENCOUNTER — Encounter: Payer: Self-pay | Admitting: Nurse Practitioner

## 2017-04-07 VITALS — BP 132/78 | HR 56 | Temp 98.3°F | Resp 17 | Ht 67.0 in | Wt 190.0 lb

## 2017-04-07 DIAGNOSIS — J0140 Acute pansinusitis, unspecified: Secondary | ICD-10-CM | POA: Diagnosis not present

## 2017-04-07 NOTE — Progress Notes (Signed)
Careteam: Patient Care Team: Gayland Curry, DO as PCP - General (Geriatric Medicine) Zebedee Iba., MD as Referring Physician (Ophthalmology) Chesley Mires, MD as Consulting Physician (Pulmonary Disease)  Advanced Directive information Does Patient Have a Medical Advance Directive?: Yes, Type of Advance Directive: [place];Living will  Allergies  Allergen Reactions  . Ace Inhibitors Cough    Chief Complaint  Patient presents with  . Acute Visit    Pt is being seen for right ear/jaw pain and sinus pressure/congestion     HPI: Patient is a 81 y.o. male seen in the office today due to sinuses and ear ache. Feeling better than last week, in the last 3 days feeling relief.  Sinus pressures for 1 week, clear drainage. Feeling stopped up as well. Mild headache.  Mild Jaw pain- -this has improved, had a hard time chewing but not as bad now.  right ear ache as well. No drainage. Also getting better.  Overall does not feel sick.   Review of Systems:  Review of Systems  Constitutional: Negative for chills, fever, malaise/fatigue and weight loss.  HENT: Positive for congestion, ear pain (right) and hearing loss (chronic). Negative for ear discharge, sinus pain, sore throat and tinnitus.   Respiratory: Positive for cough (mild cough ). Negative for sputum production and shortness of breath.   Cardiovascular: Negative for chest pain and palpitations.  Gastrointestinal: Negative for constipation and diarrhea.  Skin: Negative.   Neurological: Positive for headaches (mild). Negative for dizziness and weakness.    Past Medical History:  Diagnosis Date  . Allergy   . Alzheimer's disease   . Anemia, unspecified   . Anxiety   . Chronic maxillary sinusitis   . COPD (chronic obstructive pulmonary disease) ([place])   . Depression   . Diabetes mellitus   . Edema   . Hypercalcemia   . Hyperlipidemia   . Hypertension   . Hypertrophy of prostate without urinary  obstruction and other lower urinary tract symptoms (LUTS)   . Hypertrophy of prostate without urinary obstruction and other lower urinary tract symptoms (LUTS)   . Kidney disease    mild  . Unspecified disorder of kidney and ureter   . Unspecified sleep apnea   . Ventral hernia, unspecified, without mention of obstruction or gangrene    Past Surgical History:  Procedure Laterality Date  . EXPLORATORY LAPAROTOMY  2002  . HERNIA REPAIR     Social History:   reports that he quit smoking about 28 years ago. He quit after 30.00 years of use. He has never used smokeless tobacco. He reports that he does not drink alcohol or use drugs.  Family History  Problem Relation Age of Onset  . Pancreatic cancer Sister   . Heart disease Father   . Hypertension Brother   . Hypertension Sister     Medications: Patient's Medications  New Prescriptions   No medications on file  Previous Medications   ALBUTEROL (PROVENTIL HFA;VENTOLIN HFA) 108 (90 BASE) MCG/ACT INHALER    Inhale 2 puffs into the lungs every 6 (six) hours as needed for wheezing or shortness of breath.   AMLODIPINE (NORVASC) 10 MG TABLET    Take 1 tablet (10 mg total) by mouth daily.   ASPIRIN EC 81 MG TABLET    Take 1 tablet (81 mg total) by mouth daily.   ATORVASTATIN (LIPITOR) 20 MG TABLET    Take one tablet by mouth once daily for cholesterol   DOCUSATE SODIUM (COLACE)  100 MG CAPSULE    Take 1 capsule (100 mg total) by mouth daily.   GLUCOSE BLOOD (ONE TOUCH ULTRA TEST) TEST STRIP    Use as instructed to test blood sugar once daily dx E119   HYDRALAZINE (APRESOLINE) 25 MG TABLET    Take 1 tablet (25 mg total) by mouth 2 (two) times daily.   IPRATROPIUM-ALBUTEROL (DUONEB) 0.5-2.5 (3) MG/3ML SOLN    Take 3 mLs by nebulization every 6 (six) hours as needed (wheezing).   LANCETS (ONETOUCH ULTRASOFT) LANCETS    Use to test blood sugar up to three times daily. Dx E11.9   LINAGLIPTIN (TRADJENTA) 5 MG TABS TABLET    TAKE 1 TABLET ONE TIME  DAILY TO CONTROL BLOOD SUGAR   LOSARTAN (COZAAR) 25 MG TABLET    Take 25 mg by mouth daily.   METOPROLOL (LOPRESSOR) 50 MG TABLET    Take 1 tablet (50 mg total) by mouth 2 (two) times daily.   MONTELUKAST (SINGULAIR) 10 MG TABLET    Take 1 tablet (10 mg total) by mouth at bedtime.   PAROXETINE (PAXIL) 20 MG TABLET    Take 1 tablet by mouth once daily for depression   POLYETHYLENE GLYCOL POWDER (GLYCOLAX/MIRALAX) POWDER    Take 17 g by mouth every 3 (three) days. For constipation; hold for loose stools   TRAZODONE (DESYREL) 50 MG TABLET    Take 1 tablet by mouth at bedtime as needed for sleep  Modified Medications   No medications on file  Discontinued Medications   No medications on file     Physical Exam:  Vitals:   04/07/17 0946  BP: 132/78  Pulse: (!) 56  Resp: 17  Temp: 98.3 F (36.8 C)  TempSrc: Oral  SpO2: 96%  Weight: 190 lb (86.2 kg)  Height: 5\' 7"  (1.702 m)   Body mass index is 29.76 kg/m.  Physical Exam  Constitutional: He is oriented to person, place, and time. He appears well-developed and well-nourished. No distress.  HENT:  Head: Normocephalic and atraumatic.  Right Ear: Tympanic membrane, external ear and ear canal normal.  Left Ear: Tympanic membrane, external ear and ear canal normal.  Eyes: Pupils are equal, round, and reactive to light. Conjunctivae and EOM are normal.  Neck: Normal range of motion. Neck supple.  Cardiovascular: Normal rate, regular rhythm and normal heart sounds.   Pulmonary/Chest: Effort normal and breath sounds normal. No respiratory distress. He has no wheezes.  Musculoskeletal: He exhibits no edema.  Lymphadenopathy:    He has no cervical adenopathy.  Neurological: He is alert and oriented to person, place, and time.  Short term memory loss  Skin: Skin is warm and dry. Capillary refill takes less than 2 seconds.  Psychiatric: He has a normal mood and affect.    Labs reviewed: Basic Metabolic Panel:  Recent Labs   01/02/17 0346  01/06/17 0511 01/10/17 1007 01/30/17 0807  NA 141  < > 141 140 141  K 3.8  < > 3.6 4.1 4.3  CL 111  < > 107 104 109  CO2 24  < > 27 27 21   GLUCOSE 97  < > 106* 103* 119*  BUN 22*  < > 16 24 31*  CREATININE 1.55*  < > 1.48* 1.74* 1.69*  CALCIUM 9.3  < > 9.5 9.2 9.5  MG 1.9  --   --   --   --   < > = values in this interval not displayed. Liver Function Tests:  Recent  Labs  12/30/16 1143 12/30/16 1807 01/01/17 0426  AST 15 17 12*  ALT 10 14* 10*  ALKPHOS 53 49 37*  BILITOT 0.5 0.4 0.6  PROT 6.5 6.9 5.1*  ALBUMIN 3.8 3.8 2.7*    Recent Labs  12/30/16 1143 12/30/16 1807  LIPASE 24 28  AMYLASE 46  --    No results for input(s): AMMONIA in the last 8760 hours. CBC:  Recent Labs  12/30/16 1143  01/05/17 0703 01/06/17 0511 01/10/17 1007  WBC 11.2*  < > 14.3* 9.3 10.0  NEUTROABS 8,736*  --   --  5.3 6,400  HGB 13.7  < > 14.1 12.2* 12.3*  HCT 41.9  < > 41.4 36.7* 36.9*  MCV 96.8  < > 95.4 95.1 96.1  PLT 186  < > 176 145* 215  < > = values in this interval not displayed. Lipid Panel:  Recent Labs  06/03/16 0950 10/30/16 0844  CHOL 170 154  HDL 48 39*  LDLCALC 99 94  TRIG 114 103  CHOLHDL 3.5 3.9   TSH: No results for input(s): TSH in the last 8760 hours. A1C: Lab Results  Component Value Date   HGBA1C 5.7 (H) 01/30/2017     Assessment/Plan 1. Acute non-recurrent pansinusitis Overall improving and most likely viral;  symptom management at this time. Information provided, follow up if symptoms worsen.   Next appt: as scheduled Flynn Gwyn K. Harle Battiest  [facility] [phone] 8 am - 5 pm) [phone] (after hours)

## 2017-04-07 NOTE — Patient Instructions (Signed)
To use plain saline nasal spray as needed to help with congestion in nose Tylenol 325 mg 2 tablets every 6 hours as needed for pain.   Sinusitis, Adult Sinusitis is soreness and inflammation of your sinuses. Sinuses are hollow spaces in the bones around your face. Your sinuses are located:  Around your eyes.  In the middle of your forehead.  Behind your nose.  In your cheekbones.  Your sinuses and nasal passages are lined with a stringy fluid (mucus). Mucus normally drains out of your sinuses. When your nasal tissues become inflamed or swollen, the mucus can become trapped or blocked so air cannot flow through your sinuses. This allows bacteria, viruses, and funguses to grow, which leads to infection. Sinusitis can develop quickly and last for 7?10 days (acute) or for more than 12 weeks (chronic). Sinusitis often develops after a cold. What are the causes? This condition is caused by anything that creates swelling in the sinuses or stops mucus from draining, including:  Allergies.  Asthma.  Bacterial or viral infection.  Abnormally shaped bones between the nasal passages.  Nasal growths that contain mucus (nasal polyps).  Narrow sinus openings.  Pollutants, such as chemicals or irritants in the air.  A foreign object stuck in the nose.  A fungal infection. This is rare.  What increases the risk? The following factors may make you more likely to develop this condition:  Having allergies or asthma.  Having had a recent cold or respiratory tract infection.  Having structural deformities or blockages in your nose or sinuses.  Having a weak immune system.  Doing a lot of swimming or diving.  Overusing nasal sprays.  Smoking.  What are the signs or symptoms? The main symptoms of this condition are pain and a feeling of pressure around the affected sinuses. Other symptoms include:  Upper toothache.  Earache.  Headache.  Bad breath.  Decreased sense of smell  and taste.  A cough that may get worse at night.  Fatigue.  Fever.  Thick drainage from your nose. The drainage is often green and it may contain pus (purulent).  Stuffy nose or congestion.  Postnasal drip. This is when extra mucus collects in the throat or back of the nose.  Swelling and warmth over the affected sinuses.  Sore throat.  Sensitivity to light.  How is this diagnosed? This condition is diagnosed based on symptoms, a medical history, and a physical exam. To find out if your condition is acute or chronic, your health care provider may:  Look in your nose for signs of nasal polyps.  Tap over the affected sinus to check for signs of infection.  View the inside of your sinuses using an imaging device that has a light attached (endoscope).  If your health care provider suspects that you have chronic sinusitis, you may also:  Be tested for allergies.  Have a sample of mucus taken from your nose (nasal culture) and checked for bacteria.  Have a mucus sample examined to see if your sinusitis is related to an allergy.  If your sinusitis does not respond to treatment and it lasts longer than 8 weeks, you may have an MRI or CT scan to check your sinuses. These scans also help to determine how severe your infection is. In rare cases, a bone biopsy may be done to rule out more serious types of fungal sinus disease. How is this treated? Treatment for sinusitis depends on the cause and whether your condition is chronic or  acute. If a virus is causing your sinusitis, your symptoms will go away on their own within 10 days. You may be given medicines to relieve your symptoms, including:  Topical nasal decongestants. They shrink swollen nasal passages and let mucus drain from your sinuses.  Antihistamines. These drugs block inflammation that is triggered by allergies. This can help to ease swelling in your nose and sinuses.  Topical nasal corticosteroids. These are nasal  sprays that ease inflammation and swelling in your nose and sinuses.  Nasal saline washes. These rinses can help to get rid of thick mucus in your nose.  If your condition is caused by bacteria, you will be given an antibiotic medicine. If your condition is caused by a fungus, you will be given an antifungal medicine. Surgery may be needed to correct underlying conditions, such as narrow nasal passages. Surgery may also be needed to remove polyps. Follow these instructions at home: Medicines  Take, use, or apply over-the-counter and prescription medicines only as told by your health care provider. These may include nasal sprays.  If you were prescribed an antibiotic medicine, take it as told by your health care provider. Do not stop taking the antibiotic even if you start to feel better. Hydrate and Humidify  Drink enough water to keep your urine clear or pale yellow. Staying hydrated will help to thin your mucus.  Use a cool mist humidifier to keep the humidity level in your home above 50%.  Inhale steam for 10-15 minutes, 3-4 times a day or as told by your health care provider. You can do this in the bathroom while a hot shower is running.  Limit your exposure to cool or dry air. Rest  Rest as much as possible.  Sleep with your head raised (elevated).  Make sure to get enough sleep each night. General instructions  Apply a warm, moist washcloth to your face 3-4 times a day or as told by your health care provider. This will help with discomfort.  Wash your hands often with soap and water to reduce your exposure to viruses and other germs. If soap and water are not available, use hand sanitizer.  Do not smoke. Avoid being around people who are smoking (secondhand smoke).  Keep all follow-up visits as told by your health care provider. This is important. Contact a health care provider if:  You have a fever.  Your symptoms get worse.  Your symptoms do not improve within 10  days. Get help right away if:  You have a severe headache.  You have persistent vomiting.  You have pain or swelling around your face or eyes.  You have vision problems.  You develop confusion.  Your neck is stiff.  You have trouble breathing. This information is not intended to replace advice given to you by your health care provider. Make sure you discuss any questions you have with your health care provider. Document Released: 06/24/2005 Document Revised: 02/18/2016 Document Reviewed: 04/19/2015 Elsevier Interactive Patient Education  2017 [employer].

## 2017-04-21 DIAGNOSIS — N189 Chronic kidney disease, unspecified: Secondary | ICD-10-CM | POA: Diagnosis not present

## 2017-04-21 DIAGNOSIS — N2581 Secondary hyperparathyroidism of renal origin: Secondary | ICD-10-CM | POA: Diagnosis not present

## 2017-04-21 DIAGNOSIS — N183 Chronic kidney disease, stage 3 (moderate): Secondary | ICD-10-CM | POA: Diagnosis not present

## 2017-04-21 LAB — BASIC METABOLIC PANEL
BUN: 35 — AB (ref 4–21)
Creatinine: 1.8 — AB (ref 0.6–1.3)
Glucose: 151
POTASSIUM: 4.5 (ref 3.4–5.3)
SODIUM: 140 (ref 137–147)

## 2017-04-21 LAB — CBC AND DIFFERENTIAL
HCT: 36 — AB (ref 41–53)
HEMOGLOBIN: 12.5 — AB (ref 13.5–17.5)
Platelets: 166 (ref 150–399)
WBC: 7.2

## 2017-04-21 LAB — VITAMIN D 25 HYDROXY (VIT D DEFICIENCY, FRACTURES): Vit D, 25-Hydroxy: 27.4

## 2017-04-28 DIAGNOSIS — D631 Anemia in chronic kidney disease: Secondary | ICD-10-CM | POA: Diagnosis not present

## 2017-04-28 DIAGNOSIS — I129 Hypertensive chronic kidney disease with stage 1 through stage 4 chronic kidney disease, or unspecified chronic kidney disease: Secondary | ICD-10-CM | POA: Diagnosis not present

## 2017-04-28 DIAGNOSIS — N183 Chronic kidney disease, stage 3 (moderate): Secondary | ICD-10-CM | POA: Diagnosis not present

## 2017-04-28 DIAGNOSIS — N2581 Secondary hyperparathyroidism of renal origin: Secondary | ICD-10-CM | POA: Diagnosis not present

## 2017-05-14 ENCOUNTER — Other Ambulatory Visit: Payer: Self-pay | Admitting: *Deleted

## 2017-05-14 MED ORDER — GLUCOSE BLOOD VI STRP
ORAL_STRIP | 11 refills | Status: DC
Start: 2017-05-14 — End: 2017-12-16

## 2017-05-14 NOTE — Telephone Encounter (Signed)
Patient requested refill on test strips. Faxed to pharmacy.

## 2017-05-27 ENCOUNTER — Telehealth: Payer: Self-pay | Admitting: Internal Medicine

## 2017-05-27 NOTE — Telephone Encounter (Signed)
I called the patient to ask if he can come in for AWV before seeing Dr. Mariea Clonts.  There was no answer and no option to leave a message.  Will try again later. VDM (DD)

## 2017-06-05 ENCOUNTER — Other Ambulatory Visit: Payer: PPO

## 2017-06-06 ENCOUNTER — Other Ambulatory Visit: Payer: PPO

## 2017-06-06 DIAGNOSIS — E1169 Type 2 diabetes mellitus with other specified complication: Secondary | ICD-10-CM | POA: Diagnosis not present

## 2017-06-06 DIAGNOSIS — E1122 Type 2 diabetes mellitus with diabetic chronic kidney disease: Secondary | ICD-10-CM | POA: Diagnosis not present

## 2017-06-06 DIAGNOSIS — E785 Hyperlipidemia, unspecified: Secondary | ICD-10-CM | POA: Diagnosis not present

## 2017-06-06 DIAGNOSIS — N183 Chronic kidney disease, stage 3 (moderate): Secondary | ICD-10-CM

## 2017-06-06 DIAGNOSIS — E669 Obesity, unspecified: Secondary | ICD-10-CM

## 2017-06-07 LAB — BASIC METABOLIC PANEL
BUN/Creatinine Ratio: 19 (calc) (ref 6–22)
BUN: 30 mg/dL — ABNORMAL HIGH (ref 7–25)
CO2: 27 mmol/L (ref 20–32)
Calcium: 9.7 mg/dL (ref 8.6–10.3)
Chloride: 108 mmol/L (ref 98–110)
Creat: 1.57 mg/dL — ABNORMAL HIGH (ref 0.70–1.18)
Glucose, Bld: 130 mg/dL — ABNORMAL HIGH (ref 65–99)
Potassium: 4.2 mmol/L (ref 3.5–5.3)
Sodium: 142 mmol/L (ref 135–146)

## 2017-06-07 LAB — LIPID PANEL
Cholesterol: 173 mg/dL (ref <200)
HDL: 50 mg/dL (ref >40)
LDL Cholesterol (Calc): 102 mg/dL (calc) — ABNORMAL HIGH
Non-HDL Cholesterol (Calc): 123 mg/dL (calc) (ref <130)
Total CHOL/HDL Ratio: 3.5 (calc) (ref <5.0)
Triglycerides: 116 mg/dL (ref <150)

## 2017-06-07 LAB — HEMOGLOBIN A1C
Hgb A1c MFr Bld: 6 % of total Hgb — ABNORMAL HIGH (ref <5.7)
Mean Plasma Glucose: 126 (calc)
eAG (mmol/L): 7 (calc)

## 2017-06-09 ENCOUNTER — Ambulatory Visit: Payer: PPO

## 2017-06-09 ENCOUNTER — Ambulatory Visit: Payer: PPO | Admitting: Internal Medicine

## 2017-06-26 ENCOUNTER — Other Ambulatory Visit: Payer: Self-pay | Admitting: *Deleted

## 2017-06-26 MED ORDER — TRAZODONE HCL 50 MG PO TABS: 50.0000 mg | ORAL_TABLET | Freq: Every evening | ORAL | 0 refills | Status: DC | PRN

## 2017-06-26 NOTE — Telephone Encounter (Signed)
Patient Requested. Faxed to pharmacy.

## 2017-07-02 ENCOUNTER — Ambulatory Visit (INDEPENDENT_AMBULATORY_CARE_PROVIDER_SITE_OTHER): Payer: PPO

## 2017-07-02 VITALS — BP 128/62 | HR 57 | Temp 98.5°F | Ht 67.0 in | Wt 194.0 lb

## 2017-07-02 DIAGNOSIS — Z0000 Encounter for general adult medical examination without abnormal findings: Secondary | ICD-10-CM | POA: Diagnosis not present

## 2017-07-02 NOTE — Patient Instructions (Signed)
Ian [name] , Thank you for taking time to come for your Medicare Wellness Visit. I appreciate your ongoing commitment to your health goals. Please review the following plan we discussed and let me know if I can assist you in the future.   Screening recommendations/referrals: Colonoscopy excluded, you are over age 79 Recommended yearly ophthalmology/optometry visit for glaucoma screening and checkup Recommended yearly dental visit for hygiene and checkup  Vaccinations: Influenza vaccine up to date. Due 2019 fall season Pneumococcal vaccine up to date Tdap vaccine up to date. Due 08/11/2021 Shingles vaccine due, let us know if you decide to get this vaccine    Advanced directives: In Chart  Conditions/risks identified: none  Next appointment: Dr. Mariea [name] 07/03/2017 @ 10:30am             [name], RN 07/06/2018 @ 9:15am  Preventive Care 65 Years and Older, Male Preventive care refers to lifestyle choices and visits with your health care provider that can promote health and wellness. What does preventive care include?  A yearly physical exam. This is also called an annual well check.  Dental exams once or twice a year.  Routine eye exams. Ask your health care provider how often you should have your eyes checked.  Personal lifestyle choices, including:  Daily care of your teeth and gums.  Regular physical activity.  Eating a healthy diet.  Avoiding tobacco and drug use.  Limiting alcohol use.  Practicing safe sex.  Taking low doses of aspirin every day.  Taking vitamin and mineral supplements as recommended by your health care provider. What happens during an annual well check? The services and screenings done by your health care provider during your annual well check will depend on your age, overall health, lifestyle risk factors, and family history of disease. Counseling  Your health care provider may ask you questions about your:  Alcohol use.  Tobacco use.  Drug  use.  Emotional well-being.  Home and relationship well-being.  Sexual activity.  Eating habits.  History of falls.  Memory and ability to understand (cognition).  Work and work Statistician. Screening  You may have the following tests or measurements:  Height, weight, and BMI.  Blood pressure.  Lipid and cholesterol levels. These may be checked every 5 years, or more frequently if you are over 109 years old.  Skin check.  Lung cancer screening. You may have this screening every year starting at age 70 if you have a 30-pack-year history of smoking and currently smoke or have quit within the past 15 years.  Fecal occult blood test (FOBT) of the stool. You may have this test every year starting at age 30.  Flexible sigmoidoscopy or colonoscopy. You may have a sigmoidoscopy every 5 years or a colonoscopy every 10 years starting at age 97.  Prostate cancer screening. Recommendations will vary depending on your family history and other risks.  Hepatitis C blood test.  Hepatitis B blood test.  Sexually transmitted disease (STD) testing.  Diabetes screening. This is done by checking your blood sugar (glucose) after you have not eaten for a while (fasting). You may have this done every 1-3 years.  Abdominal aortic aneurysm (AAA) screening. You may need this if you are a current or former smoker.  Osteoporosis. You may be screened starting at age 12 if you are at high risk. Talk with your health care provider about your test results, treatment options, and if necessary, the need for more tests. Vaccines  Your health care provider  may recommend certain vaccines, such as:  Influenza vaccine. This is recommended every year.  Tetanus, diphtheria, and acellular pertussis (Tdap, Td) vaccine. You may need a Td booster every 10 years.  Zoster vaccine. You may need this after age 9.  Pneumococcal 13-valent conjugate (PCV13) vaccine. One dose is recommended after age  22.  Pneumococcal polysaccharide (PPSV23) vaccine. One dose is recommended after age 81. Talk to your health care provider about which screenings and vaccines you need and how often you need them. This information is not intended to replace advice given to you by your health care provider. Make sure you discuss any questions you have with your health care provider. Document Released: 07/21/2015 Document Revised: 03/13/2016 Document Reviewed: 04/25/2015 Elsevier Interactive Patient Education  2017 [employer] Prevention in the Home Falls can cause injuries. They can happen to people of all ages. There are many things you can do to make your home safe and to help prevent falls. What can I do on the outside of my home?  Regularly fix the edges of walkways and driveways and fix any cracks.  Remove anything that might make you trip as you walk through a door, such as a raised step or threshold.  Trim any bushes or trees on the path to your home.  Use bright outdoor lighting.  Clear any walking paths of anything that might make someone trip, such as rocks or tools.  Regularly check to see if handrails are loose or broken. Make sure that both sides of any steps have handrails.  Any raised decks and porches should have guardrails on the edges.  Have any leaves, snow, or ice cleared regularly.  Use sand or salt on walking paths during winter.  Clean up any spills in your garage right away. This includes oil or grease spills. What can I do in the bathroom?  Use night lights.  Install grab bars by the toilet and in the tub and shower. Do not use towel bars as grab bars.  Use non-skid mats or decals in the tub or shower.  If you need to sit down in the shower, use a plastic, non-slip stool.  Keep the floor dry. Clean up any water that spills on the floor as soon as it happens.  Remove soap buildup in the tub or shower regularly.  Attach bath mats securely with double-sided  non-slip rug tape.  Do not have throw rugs and other things on the floor that can make you trip. What can I do in the bedroom?  Use night lights.  Make sure that you have a light by your bed that is easy to reach.  Do not use any sheets or blankets that are too big for your bed. They should not hang down onto the floor.  Have a firm chair that has side arms. You can use this for support while you get dressed.  Do not have throw rugs and other things on the floor that can make you trip. What can I do in the kitchen?  Clean up any spills right away.  Avoid walking on wet floors.  Keep items that you use a lot in easy-to-reach places.  If you need to reach something above you, use a strong step stool that has a grab bar.  Keep electrical cords out of the way.  Do not use floor polish or wax that makes floors slippery. If you must use wax, use non-skid floor wax.  Do not have throw rugs  and other things on the floor that can make you trip. What can I do with my stairs?  Do not leave any items on the stairs.  Make sure that there are handrails on both sides of the stairs and use them. Fix handrails that are broken or loose. Make sure that handrails are as long as the stairways.  Check any carpeting to make sure that it is firmly attached to the stairs. Fix any carpet that is loose or worn.  Avoid having throw rugs at the top or bottom of the stairs. If you do have throw rugs, attach them to the floor with carpet tape.  Make sure that you have a light switch at the top of the stairs and the bottom of the stairs. If you do not have them, ask someone to add them for you. What else can I do to help prevent falls?  Wear shoes that:  Do not have high heels.  Have rubber bottoms.  Are comfortable and fit you well.  Are closed at the toe. Do not wear sandals.  If you use a stepladder:  Make sure that it is fully opened. Do not climb a closed stepladder.  Make sure that both  sides of the stepladder are locked into place.  Ask someone to hold it for you, if possible.  Clearly mark and make sure that you can see:  Any grab bars or handrails.  First and last steps.  Where the edge of each step is.  Use tools that help you move around (mobility aids) if they are needed. These include:  Canes.  Walkers.  Scooters.  Crutches.  Turn on the lights when you go into a dark area. Replace any light bulbs as soon as they burn out.  Set up your furniture so you have a clear path. Avoid moving your furniture around.  If any of your floors are uneven, fix them.  If there are any pets around you, be aware of where they are.  Review your medicines with your doctor. Some medicines can make you feel dizzy. This can increase your chance of falling. Ask your doctor what other things that you can do to help prevent falls. This information is not intended to replace advice given to you by your health care provider. Make sure you discuss any questions you have with your health care provider. Document Released: 04/20/2009 Document Revised: 11/30/2015 Document Reviewed: 07/29/2014 Elsevier Interactive Patient Education  2017 [employer].

## 2017-07-02 NOTE — Progress Notes (Signed)
Subjective:   Ian [name] is a 81 y.o. male who presents for Medicare Annual/Subsequent preventive examination.  Last AWV-06/03/2016       Objective:    Vitals: BP 128/62 (BP Location: Left Arm, Patient Position: Sitting)   Pulse (!) 57   Temp 98.5 F (36.9 C) (Oral)   Ht 5\' 7"  (1.702 m)   Wt 194 lb (88 kg)   SpO2 92%   BMI 30.38 kg/m   Body mass index is 30.38 kg/m.  Advanced Directives 07/02/2017 04/07/2017 02/03/2017 01/10/2017 01/01/2017 12/30/2016 11/04/2016  Does Patient Have a Medical Advance Directive? Yes Yes Yes Yes Yes Yes Yes  Type of Paramedic of [place];Living will [place];Living will [place];Living will [place];Living will [place];Living will [place];Living will [place];Living will  Does patient want to make changes to medical advance directive? No - Patient declined - - - No - Patient declined - -  Copy of [place] in Chart? Yes Yes Yes Yes Yes Yes Yes  Pre-existing out of facility DNR order (yellow form or pink MOST form) - - - - - - -    Tobacco Social History   Tobacco Use  Smoking Status Former Smoker  . Years: 30.00  . Last attempt to quit: 07/08/1988  . Years since quitting: 29.0  Smokeless Tobacco Never Used     Counseling given: Not Answered   Clinical Intake:  Pre-visit preparation completed: No  Pain : No/denies pain     Nutritional Risks: None Diabetes: Yes CBG done?: No Did pt. bring in CBG monitor from home?: No  How often do you need to have someone help you when you read instructions, pamphlets, or other written materials from your doctor or pharmacy?: 1 - Never What is the last grade level you completed in school?: Some College  Interpreter Needed?: No  Information entered by :: [name], RN  Past Medical History:  Diagnosis Date  . Allergy   .  Alzheimer's disease   . Anemia, unspecified   . Anxiety   . Chronic maxillary sinusitis   . COPD (chronic obstructive pulmonary disease) ([place])   . Depression   . Diabetes mellitus   . Edema   . Hypercalcemia   . Hyperlipidemia   . Hypertension   . Hypertrophy of prostate without urinary obstruction and other lower urinary tract symptoms (LUTS)   . Hypertrophy of prostate without urinary obstruction and other lower urinary tract symptoms (LUTS)   . Kidney disease    mild  . Unspecified disorder of kidney and ureter   . Unspecified sleep apnea   . Ventral hernia, unspecified, without mention of obstruction or gangrene    Past Surgical History:  Procedure Laterality Date  . EXPLORATORY LAPAROTOMY  2002  . HERNIA REPAIR     Family History  Problem Relation Age of Onset  . Pancreatic cancer Sister   . Heart disease Father   . Hypertension Brother   . Hypertension Sister    Social History   Socioeconomic History  . Marital status: Married    Spouse name: None  . Number of children: 2  . Years of education: None  . Highest education level: None  Social Needs  . Financial resource strain: Not hard at all  . Food insecurity - worry: Never true  . Food insecurity - inability: Never true  . Transportation needs - medical: No  .  Transportation needs - non-medical: No  Occupational History  . Occupation: retired    Comment: [employer]  Tobacco Use  . Smoking status: Former Smoker    Years: 30.00    Last attempt to quit: 07/08/1988    Years since quitting: 29.0  . Smokeless tobacco: Never Used  Substance and Sexual Activity  . Alcohol use: No  . Drug use: No  . Sexual activity: Not Currently  Other Topics Concern  . None  Social History Narrative  . None    Outpatient Encounter Medications as of 07/02/2017  Medication Sig  . albuterol (PROVENTIL HFA;VENTOLIN HFA) 108 (90 Base) MCG/ACT inhaler Inhale 2 puffs into the lungs every 6 (six) hours as needed for wheezing  or shortness of breath.  Marland Kitchen amLODipine (NORVASC) 10 MG tablet Take 1 tablet (10 mg total) by mouth daily.  Marland Kitchen aspirin EC 81 MG tablet Take 1 tablet (81 mg total) by mouth daily.  Marland Kitchen atorvastatin (LIPITOR) 20 MG tablet Take one tablet by mouth once daily for cholesterol  . docusate sodium (COLACE) 100 MG capsule Take 1 capsule (100 mg total) by mouth daily.  Marland Kitchen glucose blood (ONE TOUCH ULTRA TEST) test strip Use as instructed to test blood sugar once daily dx E119  . hydrALAZINE (APRESOLINE) 25 MG tablet Take 1 tablet (25 mg total) by mouth 2 (two) times daily.  Marland Kitchen ipratropium-albuterol (DUONEB) 0.5-2.5 (3) MG/3ML SOLN Take 3 mLs by nebulization every 6 (six) hours as needed (wheezing).  . Lancets (ONETOUCH ULTRASOFT) lancets Use to test blood sugar up to three times daily. Dx E11.9  . linagliptin (TRADJENTA) 5 MG TABS tablet TAKE 1 TABLET ONE TIME DAILY TO CONTROL BLOOD SUGAR  . losartan (COZAAR) 25 MG tablet Take 25 mg by mouth daily.  . metoprolol (LOPRESSOR) 50 MG tablet Take 1 tablet (50 mg total) by mouth 2 (two) times daily.  . montelukast (SINGULAIR) 10 MG tablet Take 1 tablet (10 mg total) by mouth at bedtime.  Marland Kitchen PARoxetine (PAXIL) 20 MG tablet Take 1 tablet by mouth once daily for depression  . polyethylene glycol powder (GLYCOLAX/MIRALAX) powder Take 17 g by mouth every 3 (three) days. For constipation; hold for loose stools  . traZODone (DESYREL) 50 MG tablet Take 1 tablet (50 mg total) by mouth at bedtime as needed. for sleep   No facility-administered encounter medications on file as of 07/02/2017.     Activities of Daily Living In your present state of health, do you have any difficulty performing the following activities: 07/02/2017 12/31/2016  Hearing? N Y  Comment - HOH both ears, has hearing aids but lost the one on left side  Vision? N N  Comment - wears eyeglasses  Difficulty concentrating or making decisions? N N  Walking or climbing stairs? N N  Dressing or bathing? N N    Doing errands, shopping? N N  Preparing Food and eating ? N -  Using the Toilet? N -  In the past six months, have you accidently leaked urine? N -  Do you have problems with loss of bowel control? N -  Managing your Medications? N -  Managing your Finances? N -  Housekeeping or managing your Housekeeping? N -  Some recent data might be hidden    Patient Care Team: Gayland Curry, DO as PCP - General (Geriatric Medicine) Zebedee Iba., MD as Referring Physician (Ophthalmology) Chesley Mires, MD as Consulting Physician (Pulmonary Disease)   Assessment:   This is a routine  wellness examination for [place].  Exercise Activities and Dietary recommendations Current Exercise Habits: Home exercise routine, Type of exercise: walking;strength training/weights, Time (Minutes): 30, Frequency (Times/Week): 5, Weekly Exercise (Minutes/Week): 150, Intensity: Mild, Exercise limited by: None identified  Goals    . increase water     Patient will drink 6 8 oz glasses a day.       Fall Risk Fall Risk  07/02/2017 04/07/2017 01/10/2017 12/30/2016 09/20/2016  Falls in the past year? No No No No No   Is the patient's home free of loose throw rugs in walkways, pet beds, electrical cords, etc?   yes      Grab bars in the bathroom? yes      Handrails on the stairs?   yes      Adequate lighting?   yes  Timed Get Up and Go Performed: 21 seconds, fall risk  Depression Screen PHQ 2/9 Scores 07/02/2017 09/20/2016 08/26/2016 06/03/2016  PHQ - 2 Score 0 0 0 0    Cognitive Function MMSE - Mini Mental State Exam 07/02/2017 06/03/2016 05/12/2015  Orientation to time 5 4 4   Orientation to Place 5 5 5   Registration 3 3 3   Attention/ Calculation 5 5 5   Recall 1 1 2   Language- name 2 objects 2 2 2   Language- repeat 1 1 1   Language- follow 3 step command 3 3 3   Language- read & follow direction 1 1 1   Write a sentence 1 1 1   Copy design 0 0 1  Total score 27 26 28         Immunization History   Administered Date(s) Administered  . Influenza Split 04/08/2011  . Influenza Whole 04/07/2012  . Influenza,inj,Quad PF,6+ Mos 04/23/2013, 03/28/2014, 02/23/2016  . Influenza-Unspecified 04/08/2015, 07/09/2015, 04/08/2017  . Pneumococcal Conjugate-13 06/06/2014  . Pneumococcal-Unspecified 07/09/2007  . Td 08/09/1997  . Tdap 08/12/2011    Qualifies for Shingles Vaccine? Yes, educated and said he will think on it  Screening Tests Health Maintenance  Topic Date Due  . OPHTHALMOLOGY EXAM  08/27/2015  . INFLUENZA VACCINE  02/05/2017  . FOOT EXAM  06/03/2017  . HEMOGLOBIN A1C  12/04/2017  . TETANUS/TDAP  08/11/2021  . PNA vac Low Risk Adult  Completed   Cancer Screenings: Lung: Low Dose CT Chest recommended if Age 13-80 years, 30 pack-year currently smoking OR have quit w/in 15years. Patient does not qualify. Colorectal: up to date  Additional Screenings:  Hepatitis B/HIV/Syphillis:Not indicated Hepatitis C Screening: Not indicated    Plan:  I have personally reviewed and addressed the Medicare Annual Wellness questionnaire and have noted the following in the patient's chart:  A. Medical and social history B. Use of alcohol, tobacco or illicit drugs  C. Current medications and supplements D. Functional ability and status E.  Nutritional status F.  Physical activity G. Advance directives H. List of other physicians I.  Hospitalizations, surgeries, and ER visits in previous 12 months J.  [place] to include hearing, vision, cognitive, depression L. Referrals and appointments - none  In addition, I have reviewed and discussed with patient certain preventive protocols, quality metrics, and best practice recommendations. A written personalized care plan for preventive services as well as general preventive health recommendations were provided to patient.  See attached scanned questionnaire for additional information.   Signed,   [name], RN Nurse Health  Advisor   Quick Notes   Health Maintenance: Pt will think about Shingles vaccine. Pt is going to call and make eye  appointment. Foot exam due     Abnormal Screen: MMSE 27/30. Passed clock drawing     Patient Concerns: none     Nurse Concerns: none

## 2017-07-03 ENCOUNTER — Encounter: Payer: Self-pay | Admitting: Internal Medicine

## 2017-07-03 ENCOUNTER — Ambulatory Visit (INDEPENDENT_AMBULATORY_CARE_PROVIDER_SITE_OTHER): Payer: PPO | Admitting: Internal Medicine

## 2017-07-03 VITALS — BP 130/70 | HR 58 | Temp 98.4°F | Wt 193.0 lb

## 2017-07-03 DIAGNOSIS — J449 Chronic obstructive pulmonary disease, unspecified: Secondary | ICD-10-CM

## 2017-07-03 DIAGNOSIS — N183 Chronic kidney disease, stage 3 unspecified: Secondary | ICD-10-CM

## 2017-07-03 DIAGNOSIS — E1159 Type 2 diabetes mellitus with other circulatory complications: Secondary | ICD-10-CM

## 2017-07-03 DIAGNOSIS — I10 Essential (primary) hypertension: Secondary | ICD-10-CM | POA: Diagnosis not present

## 2017-07-03 DIAGNOSIS — N1831 Chronic kidney disease, stage 3a: Secondary | ICD-10-CM

## 2017-07-03 DIAGNOSIS — F5104 Psychophysiologic insomnia: Secondary | ICD-10-CM | POA: Diagnosis not present

## 2017-07-03 DIAGNOSIS — K5904 Chronic idiopathic constipation: Secondary | ICD-10-CM

## 2017-07-03 DIAGNOSIS — E1122 Type 2 diabetes mellitus with diabetic chronic kidney disease: Secondary | ICD-10-CM | POA: Diagnosis not present

## 2017-07-03 DIAGNOSIS — J4489 Other specified chronic obstructive pulmonary disease: Secondary | ICD-10-CM

## 2017-07-03 DIAGNOSIS — I152 Hypertension secondary to endocrine disorders: Secondary | ICD-10-CM

## 2017-07-03 MED ORDER — TRAZODONE HCL 100 MG PO TABS: 50.0000 mg | ORAL_TABLET | Freq: Every evening | ORAL | 3 refills | Status: DC | PRN

## 2017-07-03 MED ORDER — ZOSTER VAC RECOMB ADJUVANTED 50 MCG/0.5ML IM SUSR: 0.5000 mL | Freq: Once | INTRAMUSCULAR | 1 refills | Status: AC

## 2017-07-03 NOTE — Progress Notes (Signed)
Location:  [facility] clinic Provider:  Abigail Marsiglia L. Mariea Clonts, D.O., C.M.D.  Code Status: DNR Goals of Care:  Advanced Directives 07/02/2017  Does Patient Have a Medical Advance Directive? Yes  Type of Paramedic of [place];Living will  Does patient want to make changes to medical advance directive? No - Patient declined  Copy of [place] in Chart? Yes  Pre-existing out of facility DNR order (yellow form or pink MOST form) -   Chief Complaint  Patient presents with  . Medical Management of Chronic Issues    27mth follow-up    HPI: Patient is a 81 y.o. male seen today for medical management of chronic diseases.    Had sinus infection in October.  Takes claritin ongoing--mostly works but sometimes still congestion.    He scored 1 point better than last year on his memory test.  Has trouble remembering appts and wife helps with this and meds.  Needs shingrix.    Needs to make his eye appt for diabetic eye exam--hasn't gotten his annual reminder.  Due for foot exam today.  Say she has started to have difficulty sleeping.  He reports that he worries about his wife a lot.  Feels a little down.  She's been short of breath off and on.    He's had a cough with ace/arb.   Reviewed labs with increase in hba1c and his LDL.  Has been eating more sweets over the holiday and eggs and sausage lately.    Bowels are moving well with stool softener.    Past Medical History:  Diagnosis Date  . Allergy   . Alzheimer's disease   . Anemia, unspecified   . Anxiety   . Chronic maxillary sinusitis   . COPD (chronic obstructive pulmonary disease) ([place])   . Depression   . Diabetes mellitus   . Edema   . Hypercalcemia   . Hyperlipidemia   . Hypertension   . Hypertrophy of prostate without urinary obstruction and other lower urinary tract symptoms (LUTS)   . Hypertrophy of prostate without urinary obstruction and other lower urinary tract symptoms (LUTS)   .  Kidney disease    mild  . Unspecified disorder of kidney and ureter   . Unspecified sleep apnea   . Ventral hernia, unspecified, without mention of obstruction or gangrene     Past Surgical History:  Procedure Laterality Date  . EXPLORATORY LAPAROTOMY  2002  . HERNIA REPAIR      Allergies  Allergen Reactions  . Ace Inhibitors Cough    Outpatient Encounter Medications as of 07/03/2017  Medication Sig  . albuterol (PROVENTIL HFA;VENTOLIN HFA) 108 (90 Base) MCG/ACT inhaler Inhale 2 puffs into the lungs every 6 (six) hours as needed for wheezing or shortness of breath.  Marland Kitchen amLODipine (NORVASC) 10 MG tablet Take 1 tablet (10 mg total) by mouth daily.  Marland Kitchen aspirin EC 81 MG tablet Take 1 tablet (81 mg total) by mouth daily.  Marland Kitchen atorvastatin (LIPITOR) 20 MG tablet Take one tablet by mouth once daily for cholesterol  . docusate sodium (COLACE) 100 MG capsule Take 1 capsule (100 mg total) by mouth daily.  Marland Kitchen glucose blood (ONE TOUCH ULTRA TEST) test strip Use as instructed to test blood sugar once daily dx E119  . hydrALAZINE (APRESOLINE) 25 MG tablet Take 1 tablet (25 mg total) by mouth 2 (two) times daily.  Marland Kitchen ipratropium-albuterol (DUONEB) 0.5-2.5 (3) MG/3ML SOLN Take 3 mLs by nebulization every 6 (six) hours as  needed (wheezing).  . Lancets (ONETOUCH ULTRASOFT) lancets Use to test blood sugar up to three times daily. Dx E11.9  . linagliptin (TRADJENTA) 5 MG TABS tablet TAKE 1 TABLET ONE TIME DAILY TO CONTROL BLOOD SUGAR  . losartan (COZAAR) 25 MG tablet Take 25 mg by mouth daily.  . metoprolol (LOPRESSOR) 50 MG tablet Take 1 tablet (50 mg total) by mouth 2 (two) times daily.  . montelukast (SINGULAIR) 10 MG tablet Take 1 tablet (10 mg total) by mouth at bedtime.  Marland Kitchen PARoxetine (PAXIL) 20 MG tablet Take 1 tablet by mouth once daily for depression  . polyethylene glycol powder (GLYCOLAX/MIRALAX) powder Take 17 g by mouth every 3 (three) days. For constipation; hold for loose stools  . traZODone  (DESYREL) 50 MG tablet Take 1 tablet (50 mg total) by mouth at bedtime as needed. for sleep   No facility-administered encounter medications on file as of 07/03/2017.     Review of Systems:  Review of Systems  Constitutional: Negative for chills, fever and malaise/fatigue.  HENT: Positive for hearing loss.        Hearing aids  Eyes: Negative for blurred vision.       Glasses  Respiratory: Negative for cough and shortness of breath.   Cardiovascular: Negative for chest pain, palpitations and leg swelling.  Gastrointestinal: Positive for constipation. Negative for abdominal pain, blood in stool and melena.  Genitourinary: Negative for dysuria.  Musculoskeletal: Negative for falls.  Skin: Negative for itching and rash.  Neurological: Negative for dizziness, loss of consciousness and weakness.  Endo/Heme/Allergies: Does not bruise/bleed easily.  Psychiatric/Behavioral: Positive for memory loss.    Health Maintenance  Topic Date Due  . OPHTHALMOLOGY EXAM  08/27/2015  . FOOT EXAM  06/03/2017  . HEMOGLOBIN A1C  12/04/2017  . TETANUS/TDAP  08/11/2021  . INFLUENZA VACCINE  Completed  . PNA vac Low Risk Adult  Completed    Physical Exam: Vitals:   07/03/17 1023  BP: 130/70  Pulse: (!) 58  Temp: 98.4 F (36.9 C)  TempSrc: Oral  SpO2: 95%  Weight: 193 lb (87.5 kg)   Body mass index is 30.23 kg/m. Physical Exam  Constitutional: He is oriented to person, place, and time. He appears well-developed and well-nourished. No distress.  Cardiovascular: Normal rate, regular rhythm, normal heart sounds and intact distal pulses.  Pulmonary/Chest: Effort normal and breath sounds normal. No respiratory distress.  Musculoskeletal: Normal range of motion.  Neurological: He is alert and oriented to person, place, and time. No cranial nerve deficit.  Skin: Skin is warm and dry.  Psychiatric: He has a normal mood and affect.    Labs reviewed: Basic Metabolic Panel: Recent Labs     01/02/17 0346  01/10/17 1007 01/30/17 0807 04/21/17 06/06/17 0806  NA 141   < > 140 141 140 142  K 3.8   < > 4.1 4.3 4.5 4.2  CL 111   < > 104 109  --  108  CO2 24   < > 27 21  --  27  GLUCOSE 97   < > 103* 119*  --  130*  BUN 22*   < > 24 31* 35* 30*  CREATININE 1.55*   < > 1.74* 1.69* 1.8* 1.57*  CALCIUM 9.3   < > 9.2 9.5  --  9.7  MG 1.9  --   --   --   --   --    < > = values in this interval not displayed.  Liver Function Tests: Recent Labs    12/30/16 1143 12/30/16 1807 01/01/17 0426  AST 15 17 12*  ALT 10 14* 10*  ALKPHOS 53 49 37*  BILITOT 0.5 0.4 0.6  PROT 6.5 6.9 5.1*  ALBUMIN 3.8 3.8 2.7*   Recent Labs    12/30/16 1143 12/30/16 1807  LIPASE 24 28  AMYLASE 46  --    No results for input(s): AMMONIA in the last 8760 hours. CBC: Recent Labs    12/30/16 1143  01/05/17 0703 01/06/17 0511 01/10/17 1007 04/21/17  WBC 11.2*   < > 14.3* 9.3 10.0 7.2  NEUTROABS 8,736*  --   --  5.3 6,400  --   HGB 13.7   < > 14.1 12.2* 12.3* 12.5*  HCT 41.9   < > 41.4 36.7* 36.9* 36*  MCV 96.8   < > 95.4 95.1 96.1  --   PLT 186   < > 176 145* 215 166   < > = values in this interval not displayed.   Lipid Panel: Recent Labs    10/30/16 0844 06/06/17 0806  CHOL 154 173  HDL 39* 50  LDLCALC 94  --   TRIG 103 116  CHOLHDL 3.9 3.5   Lab Results  Component Value Date   HGBA1C 6.0 (H) 06/06/2017    Assessment/Plan 1. Psychophysiological insomnia - due to anxiety about wife's declining health - increase trazodone (not sent to pharmacy, but advised to take 2 of the 50mg  pills) - traZODone (DESYREL) 100 MG tablet; Take 0.5 tablets (50 mg total) by mouth at bedtime as needed. for sleep  Dispense: 90 tablet; Refill: 3  2. Chronic kidney disease (CKD) stage G3a/A2, moderately decreased glomerular filtration rate (GFR) between 45-59 mL/min/1.73 square meter and albuminuria creatinine ratio between 30-299 mg/g (HCC) - Avoid nephrotoxic agents like nsaids, dose adjust  renally excreted meds, hydrate. - Microalbumin / creatinine urine ratio - Basic metabolic panel; Future  3. Hypertension associated with diabetes ([place]) -bp well controlled with current regimen, cannot take ace/arb due to cough -will make eye appt when receives annual card - Microalbumin / creatinine urine ratio - Basic metabolic panel; Future  4. Type 2 diabetes mellitus with stage 3 chronic kidney disease, without long-term current use of insulin (HCC) - cont tradjenta and lipitor - Microalbumin / creatinine urine ratio - Lipid panel; Future - Hemoglobin A1c; Future  5. Chronic idiopathic constipation -cont stool softener  6. COPD with asthma ([place]) -cont albuterol, singulair  Labs/tests ordered:   Orders Placed This Encounter  Procedures  . Microalbumin / creatinine urine ratio  . Basic metabolic panel    Standing Status:   Future    Standing Expiration Date:   03/03/2018    Order Specific Question:   Has the patient fasted?    Answer:   Yes  . Lipid panel    Standing Status:   Future    Standing Expiration Date:   03/03/2018    Order Specific Question:   Has the patient fasted?    Answer:   Yes  . Hemoglobin A1c    Standing Status:   Future    Standing Expiration Date:   03/03/2018   Next appt:  11/03/2017 med mgt, labs before  Ellard Nan L. Girl Schissler, D.O. [facility] [address], [place] [address] Cell Phone (Mon-Fri 8am-5pm):  [phone] On Call:  [phone] & follow prompts after 5pm & weekends Office Phone:  [phone] Office Fax:  [phone]

## 2017-07-03 NOTE — Patient Instructions (Signed)
Try increasing trazodone to 2 tablets at bedtime to help with sleep.

## 2017-07-04 LAB — MICROALBUMIN / CREATININE URINE RATIO
Creatinine, Urine: 122 mg/dL (ref 20–320)
Microalb Creat Ratio: 2211 mcg/mg creat — ABNORMAL HIGH (ref <30)
Microalb, Ur: 269.8 mg/dL

## 2017-07-07 ENCOUNTER — Encounter: Payer: Self-pay | Admitting: *Deleted

## 2017-07-11 DIAGNOSIS — F5104 Psychophysiologic insomnia: Secondary | ICD-10-CM

## 2017-07-11 NOTE — Telephone Encounter (Signed)
This encounter was created in error - please disregard.

## 2017-07-28 ENCOUNTER — Other Ambulatory Visit: Payer: Self-pay

## 2017-07-28 MED ORDER — PAROXETINE HCL 20 MG PO TABS: ORAL_TABLET | ORAL | 1 refills | Status: DC

## 2017-07-28 MED ORDER — ATORVASTATIN CALCIUM 20 MG PO TABS: ORAL_TABLET | ORAL | 1 refills | Status: DC

## 2017-08-05 ENCOUNTER — Other Ambulatory Visit: Payer: Self-pay | Admitting: *Deleted

## 2017-08-05 MED ORDER — METOPROLOL TARTRATE 50 MG PO TABS: 50.0000 mg | ORAL_TABLET | Freq: Two times a day (BID) | ORAL | 3 refills | Status: DC

## 2017-08-05 NOTE — Telephone Encounter (Signed)
Patient requested Rx sent to [facility]

## 2017-09-01 ENCOUNTER — Ambulatory Visit (INDEPENDENT_AMBULATORY_CARE_PROVIDER_SITE_OTHER): Payer: PPO | Admitting: Nurse Practitioner

## 2017-09-01 ENCOUNTER — Other Ambulatory Visit: Payer: Self-pay | Admitting: Nurse Practitioner

## 2017-09-01 ENCOUNTER — Ambulatory Visit
Admission: RE | Admit: 2017-09-01 | Discharge: 2017-09-01 | Disposition: A | Discharge status: Home / Self Care | Payer: PPO | Source: Ambulatory Visit | Attending: Nurse Practitioner | Admitting: Nurse Practitioner

## 2017-09-01 ENCOUNTER — Encounter: Payer: Self-pay | Admitting: Nurse Practitioner

## 2017-09-01 ENCOUNTER — Telehealth: Payer: Self-pay

## 2017-09-01 VITALS — BP 100/50 | HR 61 | Temp 97.6°F | Ht 67.0 in | Wt 191.0 lb

## 2017-09-01 DIAGNOSIS — N183 Chronic kidney disease, stage 3 (moderate): Secondary | ICD-10-CM | POA: Diagnosis not present

## 2017-09-01 DIAGNOSIS — J189 Pneumonia, unspecified organism: Secondary | ICD-10-CM

## 2017-09-01 DIAGNOSIS — B029 Zoster without complications: Secondary | ICD-10-CM | POA: Diagnosis not present

## 2017-09-01 DIAGNOSIS — N1831 Chronic kidney disease, stage 3a: Secondary | ICD-10-CM

## 2017-09-01 DIAGNOSIS — M545 Low back pain, unspecified: Secondary | ICD-10-CM

## 2017-09-01 DIAGNOSIS — R05 Cough: Secondary | ICD-10-CM | POA: Diagnosis not present

## 2017-09-01 DIAGNOSIS — J181 Lobar pneumonia, unspecified organism: Principal | ICD-10-CM

## 2017-09-01 DIAGNOSIS — J40 Bronchitis, not specified as acute or chronic: Secondary | ICD-10-CM | POA: Diagnosis not present

## 2017-09-01 LAB — BASIC METABOLIC PANEL WITH GFR
BUN/Creatinine Ratio: 17 (calc) (ref 6–22)
BUN: 49 mg/dL — ABNORMAL HIGH (ref 7–25)
CALCIUM: 9.7 mg/dL (ref 8.6–10.3)
CHLORIDE: 101 mmol/L (ref 98–110)
CO2: 28 mmol/L (ref 20–32)
Creat: 2.86 mg/dL — ABNORMAL HIGH (ref 0.70–1.18)
GFR, EST NON AFRICAN AMERICAN: 20 mL/min/{1.73_m2} — AB (ref >=60)
GFR, Est African American: 23 mL/min/{1.73_m2} — ABNORMAL LOW (ref >=60)
Glucose, Bld: 185 mg/dL — ABNORMAL HIGH (ref 65–139)
POTASSIUM: 3.8 mmol/L (ref 3.5–5.3)
SODIUM: 137 mmol/L (ref 135–146)

## 2017-09-01 LAB — CBC WITH DIFFERENTIAL/PLATELET
BASOS PCT: 0.4 %
Basophils Absolute: 56 cells/uL (ref 0–200)
EOS ABS: 111 {cells}/uL (ref 15–500)
Eosinophils Relative: 0.8 %
HCT: 35.8 % — ABNORMAL LOW (ref 38.5–50.0)
HEMOGLOBIN: 12.3 g/dL — AB (ref 13.2–17.1)
Lymphs Abs: 2405 cells/uL (ref 850–3900)
MCH: 31.9 pg (ref 27.0–33.0)
MCHC: 34.4 g/dL (ref 32.0–36.0)
MCV: 93 fL (ref 80.0–100.0)
MONOS PCT: 11 %
MPV: 10.3 fL (ref 7.5–12.5)
NEUTROS ABS: 9800 {cells}/uL — AB (ref 1500–7800)
Neutrophils Relative %: 70.5 %
PLATELETS: 194 10*3/uL (ref 140–400)
RBC: 3.85 10*6/uL — ABNORMAL LOW (ref 4.20–5.80)
RDW: 12.9 % (ref 11.0–15.0)
TOTAL LYMPHOCYTE: 17.3 %
WBC mixed population: 1529 cells/uL — ABNORMAL HIGH (ref 200–950)
WBC: 13.9 10*3/uL — ABNORMAL HIGH (ref 3.8–10.8)

## 2017-09-01 MED ORDER — PREDNISONE 10 MG (21) PO TBPK: ORAL_TABLET | ORAL | 0 refills | Status: DC

## 2017-09-01 MED ORDER — MOXIFLOXACIN HCL 400 MG PO TABS: 400.0000 mg | ORAL_TABLET | Freq: Every day | ORAL | 0 refills | Status: DC

## 2017-09-01 MED ORDER — GABAPENTIN 100 MG PO CAPS: 100.0000 mg | ORAL_CAPSULE | Freq: Three times a day (TID) | ORAL | 3 refills | Status: DC

## 2017-09-01 MED ORDER — VALACYCLOVIR HCL 1 G PO TABS
1000.0000 mg | ORAL_TABLET | Freq: Two times a day (BID) | ORAL | 0 refills | Status: DC
Start: 2017-09-01 — End: 2017-09-08

## 2017-09-01 NOTE — Telephone Encounter (Signed)
Call Report on Chest Xray: Impression-COPD, acute right upper lobe pneumonia. Follow-up PA/Lateral chest xray in 3-4 weeks following antibiotic therapy to ensure resolution and exclude underline malignancy. Mild cardiomegaly w/o pulmonary edema. Thoracic Aortic Atherosclerosis.  Patient was already released

## 2017-09-01 NOTE — Telephone Encounter (Signed)
-----   Message from Lauree Chandler, NP sent at 09/01/2017  4:00 PM EST ----- Pneumonia noted to right upper lobe as well, to continue mucinex DM by mouth twice daily with full glass of water but also add avelox which was sent to pharmacy to take daily for 7 days as well.  To follow up with Dr Mariea Clonts in 1 week

## 2017-09-01 NOTE — Progress Notes (Signed)
Careteam: Patient Care Team: Gayland Curry, DO as PCP - General (Geriatric Medicine) Zebedee Iba., MD as Referring Physician (Ophthalmology) Chesley Mires, MD as Consulting Physician (Pulmonary Disease)  Advanced Directive information    Allergies  Allergen Reactions  . Ace Inhibitors Cough    Chief Complaint  Patient presents with  . Acute Visit    Patient was at the gym the other day and tried to lift too many weights with legs, patient c/o pain on right side of chest and abdomen.   . Chest Congestion    Patient /co chest congestion x 1 week, tried mucinex  . Medication Refill    No refills needed      HPI: Patient is a 82 y.o. male seen in the office today and reports he is not feeling good. Reports last week was at the gym and was doing a machine and had too much weight on the machine and the next day had pain in lower back and buttocks, makes it hard for him to get out of the chair.  Pain was 8/10 last night. Took tylenol but did not help much.   Also notes right sided chest pain that started last night, describes pain has sharp stabbing and radiating around to the back. Only on right side of chest. Worse when area is touched.  Has scattered small itchy vesicles around nipple area, unsure when this started.   Reports cough and congestion for about 1 week. No shortness of breath noted but has clear sputum. Denies sore throat but reports he hurts all over. No fever or chills. No increase in edema.   Review of Systems:  Review of Systems  Constitutional: Negative for chills and fever.  HENT: Negative for congestion, ear discharge, ear pain and nosebleeds.   Respiratory: Positive for cough and sputum production. Negative for shortness of breath and wheezing.   Cardiovascular: Positive for chest pain. Negative for palpitations, leg swelling and PND.  Musculoskeletal: Positive for back pain, joint pain and myalgias. Negative for falls.  Skin: Positive for itching and  rash.  Neurological: Negative for dizziness, weakness and headaches.    Past Medical History:  Diagnosis Date  . Allergy   . Alzheimer's disease   . Anemia, unspecified   . Anxiety   . Chronic maxillary sinusitis   . COPD (chronic obstructive pulmonary disease) ([place])   . Depression   . Diabetes mellitus   . Edema   . Hypercalcemia   . Hyperlipidemia   . Hypertension   . Hypertrophy of prostate without urinary obstruction and other lower urinary tract symptoms (LUTS)   . Hypertrophy of prostate without urinary obstruction and other lower urinary tract symptoms (LUTS)   . Kidney disease    mild  . Unspecified disorder of kidney and ureter   . Unspecified sleep apnea   . Ventral hernia, unspecified, without mention of obstruction or gangrene    Past Surgical History:  Procedure Laterality Date  . EXPLORATORY LAPAROTOMY  2002  . HERNIA REPAIR     Social History:   reports that he quit smoking about 29 years ago. He quit after 30.00 years of use. he has never used smokeless tobacco. He reports that he does not drink alcohol or use drugs.  Family History  Problem Relation Age of Onset  . Pancreatic cancer Sister   . Heart disease Father   . Hypertension Brother   . Hypertension Sister     Medications: Patient's Medications  New Prescriptions  No medications on file  Previous Medications   ALBUTEROL (PROVENTIL HFA;VENTOLIN HFA) 108 (90 BASE) MCG/ACT INHALER    Inhale 2 puffs into the lungs every 6 (six) hours as needed for wheezing or shortness of breath.   AMLODIPINE (NORVASC) 10 MG TABLET    Take 1 tablet (10 mg total) by mouth daily.   ASPIRIN EC 81 MG TABLET    Take 1 tablet (81 mg total) by mouth daily.   ATORVASTATIN (LIPITOR) 20 MG TABLET    Take one tablet by mouth once daily for cholesterol   DOCUSATE SODIUM (COLACE) 100 MG CAPSULE    Take 1 capsule (100 mg total) by mouth daily.   GLUCOSE BLOOD (ONE TOUCH ULTRA TEST) TEST STRIP    Use as instructed to test  blood sugar once daily dx E119   HYDRALAZINE (APRESOLINE) 25 MG TABLET    Take 1 tablet (25 mg total) by mouth 2 (two) times daily.   IPRATROPIUM-ALBUTEROL (DUONEB) 0.5-2.5 (3) MG/3ML SOLN    Take 3 mLs by nebulization every 6 (six) hours as needed (wheezing).   LANCETS (ONETOUCH ULTRASOFT) LANCETS    Use to test blood sugar up to three times daily. Dx E11.9   LINAGLIPTIN (TRADJENTA) 5 MG TABS TABLET    TAKE 1 TABLET ONE TIME DAILY TO CONTROL BLOOD SUGAR   LOSARTAN (COZAAR) 25 MG TABLET    Take 25 mg by mouth daily.   METOPROLOL TARTRATE (LOPRESSOR) 50 MG TABLET    Take 1 tablet (50 mg total) by mouth 2 (two) times daily.   MONTELUKAST (SINGULAIR) 10 MG TABLET    Take 1 tablet (10 mg total) by mouth at bedtime.   PAROXETINE (PAXIL) 20 MG TABLET    Take 1 tablet by mouth once daily for depression   POLYETHYLENE GLYCOL POWDER (GLYCOLAX/MIRALAX) POWDER    Take 17 g by mouth every 3 (three) days. For constipation; hold for loose stools   TRAZODONE (DESYREL) 100 MG TABLET    Take 0.5 tablets (50 mg total) by mouth at bedtime as needed. for sleep  Modified Medications   No medications on file  Discontinued Medications   No medications on file     Physical Exam:  Vitals:   09/01/17 1402  BP: (!) 100/50  Pulse: 61  Temp: 97.6 F (36.4 C)  TempSrc: Oral  SpO2: 91%  Weight: 191 lb (86.6 kg)  Height: 5\' 7"  (1.702 m)   Body mass index is 29.91 kg/m.  Physical Exam  Constitutional: He is oriented to person, place, and time. He appears well-developed and well-nourished. No distress.  HENT:  Head: Normocephalic and atraumatic.  Mouth/Throat: Oropharynx is clear and moist. No oropharyngeal exudate.  Eyes: Conjunctivae and EOM are normal. Pupils are equal, round, and reactive to light.  Neck: Normal range of motion. Neck supple.  Cardiovascular: Normal rate, regular rhythm and normal heart sounds.  Pulmonary/Chest: Effort normal and breath sounds normal.  Abdominal: Soft. Bowel sounds are  normal.  Musculoskeletal: He exhibits no edema or tenderness.       Back:  Neurological: He is alert and oriented to person, place, and time.  Skin: Skin is warm and dry. Rash noted. Rash is vesicular. He is not diaphoretic.     Psychiatric: He has a normal mood and affect.    Labs reviewed: Basic Metabolic Panel: Recent Labs    01/02/17 0346  01/10/17 1007 01/30/17 0807 04/21/17 06/06/17 0806  NA 141   < > 140 141 140 142  K 3.8   < > 4.1 4.3 4.5 4.2  CL 111   < > 104 109  --  108  CO2 24   < > 27 21  --  27  GLUCOSE 97   < > 103* 119*  --  130*  BUN 22*   < > 24 31* 35* 30*  CREATININE 1.55*   < > 1.74* 1.69* 1.8* 1.57*  CALCIUM 9.3   < > 9.2 9.5  --  9.7  MG 1.9  --   --   --   --   --    < > = values in this interval not displayed.  CrCl cannot be calculated (Patient's most recent lab result is older than the maximum 21 days allowed.).  Liver Function Tests: Recent Labs    12/30/16 1143 12/30/16 1807 01/01/17 0426  AST 15 17 12*  ALT 10 14* 10*  ALKPHOS 53 49 37*  BILITOT 0.5 0.4 0.6  PROT 6.5 6.9 5.1*  ALBUMIN 3.8 3.8 2.7*   Recent Labs    12/30/16 1143 12/30/16 1807  LIPASE 24 28  AMYLASE 46  --    No results for input(s): AMMONIA in the last 8760 hours. CBC: Recent Labs    12/30/16 1143  01/05/17 0703 01/06/17 0511 01/10/17 1007 04/21/17  WBC 11.2*   < > 14.3* 9.3 10.0 7.2  NEUTROABS 8,736*  --   --  5.3 6,400  --   HGB 13.7   < > 14.1 12.2* 12.3* 12.5*  HCT 41.9   < > 41.4 36.7* 36.9* 36*  MCV 96.8   < > 95.4 95.1 96.1  --   PLT 186   < > 176 145* 215 166   < > = values in this interval not displayed.   Lipid Panel: Recent Labs    10/30/16 0844 06/06/17 0806  CHOL 154 173  HDL 39* 50  LDLCALC 94  --   TRIG 103 116  CHOLHDL 3.9 3.5   TSH: No results for input(s): TSH in the last 8760 hours. A1C: Lab Results  Component Value Date   HGBA1C 6.0 (H) 06/06/2017     Assessment/Plan 1. Chronic kidney disease (CKD) stage G3a/A2,  moderately decreased glomerular filtration rate (GFR) between 45-59 mL/min/1.73 square meter and albuminuria creatinine ratio between 30-299 mg/g (HCC) -will follow up BMP at this time  2. Herpes zoster without complication -chest pain consistent with herpes zoster based on hx and vesicles  - valACYclovir (VALTREX) 1000 MG tablet; Take 1 tablet (1,000 mg total) by mouth 2 (two) times daily.  Dispense: 14 tablet; Refill: 0 - BASIC METABOLIC PANEL WITH GFR - predniSONE (STERAPRED UNI-PAK 21 TAB) 10 MG (21) TBPK tablet; Use as directed  Dispense: 21 tablet; Refill: 0 - gabapentin (NEURONTIN) 100 MG capsule; Take 1 capsule (100 mg total) by mouth 3 (three) times daily.  Dispense: 90 capsule; Refill: 3 for pain   3. Acute right-sided low back pain without sciatica -increase pain after increased weights on machine at gym, education provided on this. To continue to use tylenol as needed, may also use muscle rub TID if needed  - predniSONE (STERAPRED UNI-PAK 21 TAB) 10 MG (21) TBPK tablet; Use as directed  Dispense: 21 tablet; Refill: 0  4. Bronchitis -ongoing cough and congestion -to use mucinex DM by mouth twice daily for 1 week Increase hydration - CBC with Differential/Platelets - DG Chest 2 View- rule out pneumonia -follow up precautions given  [place] K. Dewaine Oats,  [facility] [phone] 8 am - 5 pm) [phone] (after hours)

## 2017-09-01 NOTE — Telephone Encounter (Signed)
Called patient, patient not available. No voicemail    Spoke with patient's wife. Mrs.Goldner was calling to see how his appointment was going for he had not made it home yet. I informed her of his xray results, Mrs.Tolleson verbalized understanding. Scheduled appointment for Monday 09/08/17 to follow-up with Dr.[name].  Mr.Antwine called right back for he seen that he had a missed call. I informed patient of his results as well and he verbalized understanding. Mr.Gruhn had picked up rx's prior to returning call.

## 2017-09-01 NOTE — Patient Instructions (Signed)
1- Herpes zoster (shingles) without complication To start valACYclovir (VALTREX) 1000 MG tablet; Take 1 tablet (1,000 mg total) by mouth 2 (two) times daily to 1 week  FOR PAIN due to shingles  - gabapentin (NEURONTIN) 100 MG capsule; Take 1 capsule (100 mg total) by mouth 3 (three) times daily as needed for pain  2- Back/hip pain - predniSONE (STERAPRED UNI-PAK 21 TAB) 10 MG (21) TBPK tablet; Use as directed  Dispense: 21 tablet; Refill: 0  3- Bronchitis To get chest xray once you leave office To use mucinex DM by mouth twice daily with full glass of water for 7 days-take this routinely   Acute Bronchitis, Adult Acute bronchitis is when air tubes (bronchi) in the lungs suddenly get swollen. The condition can make it hard to breathe. It can also cause these symptoms:  A cough.  Coughing up clear, yellow, or green mucus.  Wheezing.  Chest congestion.  Shortness of breath.  A fever.  Body aches.  Chills.  A sore throat.  Follow these instructions at home: Medicines  Take over-the-counter and prescription medicines only as told by your doctor.  If you were prescribed an antibiotic medicine, take it as told by your doctor. Do not stop taking the antibiotic even if you start to feel better. General instructions  Rest.  Drink enough fluids to keep your pee (urine) clear or pale yellow.  Avoid smoking and secondhand smoke. If you smoke and you need help quitting, ask your doctor. Quitting will help your lungs heal faster.  Use an inhaler, cool mist vaporizer, or humidifier as told by your doctor.  Keep all follow-up visits as told by your doctor. This is important. How is this prevented? To lower your risk of getting this condition again:  Wash your hands often with soap and water. If you cannot use soap and water, use hand sanitizer.  Avoid contact with people who have cold symptoms.  Try not to touch your hands to your mouth, nose, or eyes.  Make sure to get the  flu shot every year.  Contact a doctor if:  Your symptoms do not get better in 2 weeks. Get help right away if:  You cough up blood.  You have chest pain.  You have very bad shortness of breath.  You become dehydrated.  You faint (pass out) or keep feeling like you are going to pass out.  You keep throwing up (vomiting).  You have a very bad headache.  Your fever or chills gets worse. This information is not intended to replace advice given to you by your health care provider. Make sure you discuss any questions you have with your health care provider. Document Released: 12/11/2007 Document Revised: 01/31/2016 Document Reviewed: 12/13/2015 Elsevier Interactive Patient Education  2018 [place] is an infection that causes a painful skin rash and fluid-filled blisters. Shingles is caused by the same virus that causes chickenpox. Shingles only develops in people who:  Have had chickenpox.  Have gotten the chickenpox vaccine. (This is rare.)  The first symptoms of shingles may be itching, tingling, or pain in an area on your skin. A rash will follow in a few days or weeks. The rash is usually on one side of the body in a bandlike or beltlike pattern. Over time, the rash turns into fluid-filled blisters that break open, scab over, and dry up. Medicines may:  Help you manage pain.  Help you recover more quickly.  Help to prevent long-term problems.  Follow these instructions at home: Medicines  Take medicines only as told by your doctor.  Apply an anti-itch or numbing cream to the affected area as told by your doctor. Blister and Rash Care  Take a cool bath or put cool compresses on the area of the rash or blisters as told by your doctor. This may help with pain and itching.  Keep your rash covered with a loose bandage (dressing). Wear loose-fitting clothing.  Keep your rash and blisters clean with mild soap and cool water or as told by your  doctor.  Check your rash every day for signs of infection. These include redness, swelling, and pain that lasts or gets worse.  Do not pick your blisters.  Do not scratch your rash. General instructions  Rest as told by your doctor.  Keep all follow-up visits as told by your doctor. This is important.  Until your blisters scab over, your infection can cause chickenpox in people who have never had it or been vaccinated against it. To prevent this from happening, avoid touching other people or being around other people, especially: ? Babies. ? Pregnant women. ? Children who have eczema. ? Elderly people who have transplants. ? People who have chronic illnesses, such as leukemia or AIDS. Contact a doctor if:  Your pain does not get better with medicine.  Your pain does not get better after the rash heals.  Your rash looks infected. Signs of infection include: ? Redness. ? Swelling. ? Pain that lasts or gets worse. Get help right away if:  The rash is on your face or nose.  You have pain in your face, pain around your eye area, or loss of feeling on one side of your face.  You have ear pain or you have ringing in your ear.  You have loss of taste.  Your condition gets worse. This information is not intended to replace advice given to you by your health care provider. Make sure you discuss any questions you have with your health care provider. Document Released: 12/11/2007 Document Revised: 02/18/2016 Document Reviewed: 04/05/2014 Elsevier Interactive Patient Education  [employer].

## 2017-09-01 NOTE — Telephone Encounter (Signed)
See xray notes, also will place order for follow up chest xray in 4 weeks

## 2017-09-05 ENCOUNTER — Other Ambulatory Visit: Payer: Self-pay | Admitting: Internal Medicine

## 2017-09-08 ENCOUNTER — Encounter: Payer: Self-pay | Admitting: Internal Medicine

## 2017-09-08 ENCOUNTER — Ambulatory Visit (INDEPENDENT_AMBULATORY_CARE_PROVIDER_SITE_OTHER): Payer: PPO | Admitting: Internal Medicine

## 2017-09-08 ENCOUNTER — Ambulatory Visit
Admission: RE | Admit: 2017-09-08 | Discharge: 2017-09-08 | Disposition: A | Discharge status: Home / Self Care | Payer: PPO | Source: Ambulatory Visit | Attending: Nurse Practitioner | Admitting: Nurse Practitioner

## 2017-09-08 VITALS — BP 140/70 | HR 53 | Temp 97.8°F | Wt 194.0 lb

## 2017-09-08 DIAGNOSIS — F5101 Primary insomnia: Secondary | ICD-10-CM

## 2017-09-08 DIAGNOSIS — B029 Zoster without complications: Secondary | ICD-10-CM

## 2017-09-08 DIAGNOSIS — N1831 Chronic kidney disease, stage 3a: Secondary | ICD-10-CM

## 2017-09-08 DIAGNOSIS — J189 Pneumonia, unspecified organism: Secondary | ICD-10-CM

## 2017-09-08 DIAGNOSIS — N183 Chronic kidney disease, stage 3 (moderate): Secondary | ICD-10-CM

## 2017-09-08 DIAGNOSIS — J181 Lobar pneumonia, unspecified organism: Secondary | ICD-10-CM | POA: Diagnosis not present

## 2017-09-08 DIAGNOSIS — J40 Bronchitis, not specified as acute or chronic: Secondary | ICD-10-CM | POA: Diagnosis not present

## 2017-09-08 LAB — BASIC METABOLIC PANEL
BUN/Creatinine Ratio: 30 (calc) — ABNORMAL HIGH (ref 6–22)
BUN: 70 mg/dL — ABNORMAL HIGH (ref 7–25)
CO2: 25 mmol/L (ref 20–32)
Calcium: 9.9 mg/dL (ref 8.6–10.3)
Chloride: 109 mmol/L (ref 98–110)
Creat: 2.37 mg/dL — ABNORMAL HIGH (ref 0.70–1.18)
Glucose, Bld: 130 mg/dL (ref 65–139)
Potassium: 4.7 mmol/L (ref 3.5–5.3)
Sodium: 141 mmol/L (ref 135–146)

## 2017-09-08 MED ORDER — HYDRALAZINE HCL 25 MG PO TABS: 25.0000 mg | ORAL_TABLET | Freq: Two times a day (BID) | ORAL | 3 refills | Status: DC

## 2017-09-08 MED ORDER — TRAZODONE HCL 100 MG PO TABS: 50.0000 mg | ORAL_TABLET | Freq: Every evening | ORAL | 3 refills | Status: DC | PRN

## 2017-09-08 NOTE — Progress Notes (Signed)
Location:  [facility] clinic Provider:  [name] L. Mariea Clonts, D.O., C.M.D.  Code Status: DNR Goals of Care:  Advanced Directives 09/08/2017  Does Patient Have a Medical Advance Directive? Yes  Type of Paramedic of [place];Living will  Does patient want to make changes to medical advance directive? No - Patient declined  Copy of [place] in Chart? Yes  Pre-existing out of facility DNR order (yellow form or pink MOST form) -     Chief Complaint  Patient presents with  . Follow-up    1 week for pneumonia  . ACP    LIVING WILL, HCPOA    HPI: Patient is a 82 y.o. male seen today for medical management of chronic diseases. Today he is accompanied by his wife today. He is friendly and pleasant in conversation. He reports feeling better, a few days left of antiviral and antibiotic. Does not report pain that was previously present a week ago. There are no other complaints in office today. He needs refills on his trazodone and hydralazine.    Past Medical History:  Diagnosis Date  . Allergy   . Alzheimer's disease   . Anemia, unspecified   . Anxiety   . Chronic maxillary sinusitis   . COPD (chronic obstructive pulmonary disease) ([place])   . Depression   . Diabetes mellitus   . Edema   . Hypercalcemia   . Hyperlipidemia   . Hypertension   . Hypertrophy of prostate without urinary obstruction and other lower urinary tract symptoms (LUTS)   . Hypertrophy of prostate without urinary obstruction and other lower urinary tract symptoms (LUTS)   . Kidney disease    mild  . Unspecified disorder of kidney and ureter   . Unspecified sleep apnea   . Ventral hernia, unspecified, without mention of obstruction or gangrene     Past Surgical History:  Procedure Laterality Date  . EXPLORATORY LAPAROTOMY  2002  . HERNIA REPAIR      Allergies  Allergen Reactions  . Ace Inhibitors Cough    Outpatient Encounter Medications as of 09/08/2017  Medication Sig    . albuterol (PROVENTIL HFA;VENTOLIN HFA) 108 (90 Base) MCG/ACT inhaler Inhale 2 puffs into the lungs every 6 (six) hours as needed for wheezing or shortness of breath.  Marland Kitchen amLODipine (NORVASC) 10 MG tablet Take 1 tablet by mouth daily  . aspirin EC 81 MG tablet Take 1 tablet (81 mg total) by mouth daily.  Marland Kitchen atorvastatin (LIPITOR) 20 MG tablet Take one tablet by mouth once daily for cholesterol  . gabapentin (NEURONTIN) 100 MG capsule Take 1 capsule (100 mg total) by mouth 3 (three) times daily.  Marland Kitchen glucose blood (ONE TOUCH ULTRA TEST) test strip Use as instructed to test blood sugar once daily dx E119  . hydrALAZINE (APRESOLINE) 25 MG tablet Take 1 tablet (25 mg total) by mouth 2 (two) times daily.  Marland Kitchen ipratropium-albuterol (DUONEB) 0.5-2.5 (3) MG/3ML SOLN Take 3 mLs by nebulization every 6 (six) hours as needed (wheezing).  . Lancets (ONETOUCH ULTRASOFT) lancets Use to test blood sugar up to three times daily. Dx E11.9  . linagliptin (TRADJENTA) 5 MG TABS tablet TAKE 1 TABLET ONE TIME DAILY TO CONTROL BLOOD SUGAR  . losartan (COZAAR) 25 MG tablet Take 25 mg by mouth daily.  . metoprolol tartrate (LOPRESSOR) 50 MG tablet Take 1 tablet (50 mg total) by mouth 2 (two) times daily.  . montelukast (SINGULAIR) 10 MG tablet Take 1 tablet (10 mg total) by  mouth at bedtime.  Marland Kitchen PARoxetine (PAXIL) 20 MG tablet Take 1 tablet by mouth once daily for depression  . polyethylene glycol powder (GLYCOLAX/MIRALAX) powder Take 17 g by mouth every 3 (three) days. For constipation; hold for loose stools  . traZODone (DESYREL) 100 MG tablet Take 0.5 tablets (50 mg total) by mouth at bedtime as needed. for sleep  . [DISCONTINUED] docusate sodium (COLACE) 100 MG capsule Take 1 capsule (100 mg total) by mouth daily.  . [DISCONTINUED] moxifloxacin (AVELOX) 400 MG tablet Take 1 tablet (400 mg total) by mouth daily.  . [DISCONTINUED] predniSONE (STERAPRED UNI-PAK 21 TAB) 10 MG (21) TBPK tablet Use as directed  . [DISCONTINUED]  valACYclovir (VALTREX) 1000 MG tablet Take 1 tablet (1,000 mg total) by mouth 2 (two) times daily.   No facility-administered encounter medications on file as of 09/08/2017.     Review of Systems:  Review of Systems  Constitutional: Negative for chills, fever and malaise/fatigue.  Eyes:       Glasses  Respiratory: Negative for cough and shortness of breath.   Cardiovascular: Positive for leg swelling. Negative for chest pain and palpitations.       Sock line edema  Gastrointestinal: Negative.   Genitourinary: Negative.   Skin: Negative.        Reports rash is gone  Neurological: Negative.   Psychiatric/Behavioral: The patient does not have insomnia.     Health Maintenance  Topic Date Due  . OPHTHALMOLOGY EXAM  08/27/2015  . HEMOGLOBIN A1C  12/04/2017  . FOOT EXAM  07/03/2018  . TETANUS/TDAP  08/11/2021  . INFLUENZA VACCINE  Completed  . PNA vac Low Risk Adult  Completed    Physical Exam: Vitals:   09/08/17 1540  BP: 140/70  Pulse: (!) 53  Temp: 97.8 F (36.6 C)  TempSrc: Oral  SpO2: 97%  Weight: 194 lb (88 kg)   Body mass index is 30.38 kg/m. Physical Exam  Constitutional: He is oriented to person, place, and time. He appears well-developed and well-nourished.  HENT:  Head: Normocephalic.  Cardiovascular: Normal rate, regular rhythm, normal heart sounds and intact distal pulses.  Pulmonary/Chest: Effort normal and breath sounds normal.  Musculoskeletal: Normal range of motion.  Neurological: He is alert and oriented to person, place, and time.  Skin: Skin is warm and dry. Capillary refill takes less than 2 seconds.  Psychiatric: He has a normal mood and affect. His behavior is normal. Judgment and thought content normal.  Vitals reviewed.   Labs reviewed: Basic Metabolic Panel: Recent Labs    01/02/17 0346  01/30/17 0807 04/21/17 06/06/17 0806 09/01/17 1500  NA 141   < > 141 140 142 137  K 3.8   < > 4.3 4.5 4.2 3.8  CL 111   < > 109  --  108 101  CO2  24   < > 21  --  27 28  GLUCOSE 97   < > 119*  --  130* 185*  BUN 22*   < > 31* 35* 30* 49*  CREATININE 1.55*   < > 1.69* 1.8* 1.57* 2.86*  CALCIUM 9.3   < > 9.5  --  9.7 9.7  MG 1.9  --   --   --   --   --    < > = values in this interval not displayed.   Liver Function Tests: Recent Labs    12/30/16 1143 12/30/16 1807 01/01/17 0426  AST 15 17 12*  ALT 10 14* 10*  ALKPHOS 53 49 37*  BILITOT 0.5 0.4 0.6  PROT 6.5 6.9 5.1*  ALBUMIN 3.8 3.8 2.7*   Recent Labs    12/30/16 1143 12/30/16 1807  LIPASE 24 28  AMYLASE 46  --    No results for input(s): AMMONIA in the last 8760 hours. CBC: Recent Labs    01/06/17 0511 01/10/17 1007 04/21/17 09/01/17 1500  WBC 9.3 10.0 7.2 13.9*  NEUTROABS 5.3 6,400  --  9,800*  HGB 12.2* 12.3* 12.5* 12.3*  HCT 36.7* 36.9* 36* 35.8*  MCV 95.1 96.1  --  93.0  PLT 145* 215 166 194   Lipid Panel: Recent Labs    10/30/16 0844 06/06/17 0806  CHOL 154 173  HDL 39* 50  LDLCALC 94  --   TRIG 103 116  CHOLHDL 3.9 3.5   Lab Results  Component Value Date   HGBA1C 6.0 (H) 06/06/2017    Procedures since last visit: Dg Chest 2 View  Result Date: 09/01/2017 CLINICAL DATA:  Cough, fever, right lower chest and rib cage pain for the past week. Former smoker. Previous episodes of pneumonia. EXAM: CHEST  2 VIEW COMPARISON:  Chest x-ray of January 23, 2016 FINDINGS: The lungs are hyperinflated with hemidiaphragm flattening. There is patchy airspace opacity inferiorly in the right upper lobe. The right middle and lower lobes are clear. The left lung is clear. The heart is mildly enlarged but stable. The pulmonary vascularity is normal. There is calcification in the wall of the thoracic aorta. There is mild degenerative disc disease of the midthoracic spine. IMPRESSION: COPD. Acute right upper lobe pneumonia. Followup PA and lateral chest X-ray is recommended in 3-4 weeks following trial of antibiotic therapy to ensure resolution and exclude underlying  malignancy. Mild cardiomegaly without pulmonary edema. Thoracic aortic atherosclerosis. Electronically Signed   By: David  Martinique M.D.   On: 09/01/2017 15:41    Assessment/Plan  1. Herpes zoster without complication He has resolution of rash from shingles. Advised to continue antiviral until completed full course. He has not had to continue to take the any gabapentin as his pain is gone.  2. Bronchitis He has resolution of bronchitis and has finished his prednisone completely without trouble. He has no lingering cough.   3. Pneumonia of right upper lobe due to infectious organism [facility]) His xray would still demonstrate PNA, but at this time he is without complaints and sounds good in office on exam. Advised to continue antibiotics so he completes  the full course.   4. Chronic kidney disease (CKD) stage G3a/A2, moderately decreased glomerular filtration rate (GFR) between 45-59 mL/min/1.73 square meter and albuminuria creatinine ratio between 30-299 mg/g (HCC) BP is stable in office will continue his current medication regimen.  Creatinine was elevated last week on lab draw. Will reassess this today in office with labs.   - Basic metabolic panel - hydrALAZINE (APRESOLINE) 25 MG tablet; Take 1 tablet (25 mg total) by mouth 2 (two) times daily.  Dispense: 180 tablet; Refill: 3   5. Primary insomnia He is sleeping well, but requires trazodone nightly to help, otherwise he cannot sleep. Will provide refill of this.   - traZODone (DESYREL) 100 MG tablet; Take 0.5 tablets (50 mg total) by mouth at bedtime as needed. for sleep  Dispense: 90 tablet; Refill: 3  Labs/tests ordered:  Orders Placed This Encounter  Procedures  . Basic metabolic panel    Order Specific Question:   Has the patient fasted?    Answer:   Yes  Next appt:  10/30/2017   Karen Kays, RN, DNP Student Geriatrics [facility] [phone], [place] [address] Cell Phone  (Mon-Fri 8am-5pm):  [phone] On Call:  [phone] & follow prompts after 5pm & weekends Office Phone:  [phone] Office Fax:  [phone]

## 2017-09-09 ENCOUNTER — Other Ambulatory Visit: Payer: Self-pay | Admitting: Nurse Practitioner

## 2017-09-09 ENCOUNTER — Other Ambulatory Visit: Payer: Self-pay

## 2017-09-09 DIAGNOSIS — J181 Lobar pneumonia, unspecified organism: Principal | ICD-10-CM

## 2017-09-09 DIAGNOSIS — J189 Pneumonia, unspecified organism: Secondary | ICD-10-CM

## 2017-09-11 ENCOUNTER — Encounter: Payer: Self-pay | Admitting: Nurse Practitioner

## 2017-09-11 ENCOUNTER — Ambulatory Visit (INDEPENDENT_AMBULATORY_CARE_PROVIDER_SITE_OTHER): Payer: PPO | Admitting: Nurse Practitioner

## 2017-09-11 VITALS — BP 122/80 | HR 62 | Temp 98.7°F | Ht 67.0 in | Wt 193.0 lb

## 2017-09-11 DIAGNOSIS — B029 Zoster without complications: Secondary | ICD-10-CM

## 2017-09-11 DIAGNOSIS — M5441 Lumbago with sciatica, right side: Secondary | ICD-10-CM

## 2017-09-11 NOTE — Progress Notes (Signed)
Careteam: Patient Care Team: Gayland Curry, DO as PCP - General (Geriatric Medicine) Zebedee Iba., MD as Referring Physician (Ophthalmology) Chesley Mires, MD as Consulting Physician (Pulmonary Disease)  Advanced Directive information    Allergies  Allergen Reactions  . Ace Inhibitors Cough    Chief Complaint  Patient presents with  . Acute Visit    Pt is being seen due to pain on right side. Pt reports rapid, sharp pain that runs from hip up to right side. Pt thinks pain is from shingles. No open sores or injury visible.      HPI: Patient is a 82 y.o. male seen in the office today due to pain on right side.  Reports pain in chest is gone but reports pain in low right side/hip has been bothering him.  Pain noted when he gets up out of the chair or when he walks.  Worse when he first starts walking but then it will go away.  No injury noted.  Reports some pain going down leg a little bit, reports "a little numb" to thigh. Pain is a 5/10. Ache to lower back/hip area. No loss of bowel or bladder No weakness Has been taking 1 tylenol which has been helping some "not much" Been going on a few weeks.   Continues to take gabapentin and valtrex - no further vesicles or pain.   Review of Systems:  Review of Systems  Constitutional: Negative for chills, fever and malaise/fatigue.  Respiratory: Negative for cough and shortness of breath.   Cardiovascular: Negative for chest pain and leg swelling.  Musculoskeletal: Positive for back pain and myalgias. Negative for falls and joint pain.  Neurological: Negative for weakness.    Past Medical History:  Diagnosis Date  . Allergy   . Alzheimer's disease   . Anemia, unspecified   . Anxiety   . Chronic maxillary sinusitis   . COPD (chronic obstructive pulmonary disease) ([place])   . Depression   . Diabetes mellitus   . Edema   . Hypercalcemia   . Hyperlipidemia   . Hypertension   . Hypertrophy of prostate without urinary  obstruction and other lower urinary tract symptoms (LUTS)   . Hypertrophy of prostate without urinary obstruction and other lower urinary tract symptoms (LUTS)   . Kidney disease    mild  . Unspecified disorder of kidney and ureter   . Unspecified sleep apnea   . Ventral hernia, unspecified, without mention of obstruction or gangrene    Past Surgical History:  Procedure Laterality Date  . EXPLORATORY LAPAROTOMY  2002  . HERNIA REPAIR     Social History:   reports that he quit smoking about 29 years ago. He quit after 30.00 years of use. he has never used smokeless tobacco. He reports that he does not drink alcohol or use drugs.  Family History  Problem Relation Age of Onset  . Pancreatic cancer Sister   . Heart disease Father   . Hypertension Brother   . Hypertension Sister     Medications: Patient's Medications  New Prescriptions   No medications on file  Previous Medications   ALBUTEROL (PROVENTIL HFA;VENTOLIN HFA) 108 (90 BASE) MCG/ACT INHALER    Inhale 2 puffs into the lungs every 6 (six) hours as needed for wheezing or shortness of breath.   AMLODIPINE (NORVASC) 10 MG TABLET    Take 1 tablet by mouth daily   ASPIRIN EC 81 MG TABLET    Take 1 tablet (81 mg total)  by mouth daily.   ATORVASTATIN (LIPITOR) 20 MG TABLET    Take one tablet by mouth once daily for cholesterol   GABAPENTIN (NEURONTIN) 100 MG CAPSULE    Take 1 capsule (100 mg total) by mouth 3 (three) times daily.   GLUCOSE BLOOD (ONE TOUCH ULTRA TEST) TEST STRIP    Use as instructed to test blood sugar once daily dx E119   HYDRALAZINE (APRESOLINE) 25 MG TABLET    Take 1 tablet (25 mg total) by mouth 2 (two) times daily.   IPRATROPIUM-ALBUTEROL (DUONEB) 0.5-2.5 (3) MG/3ML SOLN    Take 3 mLs by nebulization every 6 (six) hours as needed (wheezing).   LANCETS (ONETOUCH ULTRASOFT) LANCETS    Use to test blood sugar up to three times daily. Dx E11.9   LINAGLIPTIN (TRADJENTA) 5 MG TABS TABLET    TAKE 1 TABLET ONE TIME  DAILY TO CONTROL BLOOD SUGAR   LOSARTAN (COZAAR) 25 MG TABLET    Take 25 mg by mouth daily.   METOPROLOL TARTRATE (LOPRESSOR) 50 MG TABLET    Take 1 tablet (50 mg total) by mouth 2 (two) times daily.   MONTELUKAST (SINGULAIR) 10 MG TABLET    Take 1 tablet (10 mg total) by mouth at bedtime.   PAROXETINE (PAXIL) 20 MG TABLET    Take 1 tablet by mouth once daily for depression   POLYETHYLENE GLYCOL POWDER (GLYCOLAX/MIRALAX) POWDER    Take 17 g by mouth every 3 (three) days. For constipation; hold for loose stools   TRAZODONE (DESYREL) 100 MG TABLET    Take 0.5 tablets (50 mg total) by mouth at bedtime as needed. for sleep  Modified Medications   No medications on file  Discontinued Medications   No medications on file     Physical Exam:  Vitals:   09/11/17 1016  BP: 122/80  Pulse: 62  Temp: 98.7 F (37.1 C)  TempSrc: Oral  SpO2: 99%  Weight: 193 lb (87.5 kg)  Height: 5\' 7"  (1.702 m)   Body mass index is 30.23 kg/m.  Physical Exam  Constitutional: He is oriented to person, place, and time. He appears well-developed and well-nourished.  HENT:  Head: Normocephalic.  Cardiovascular: Normal rate, regular rhythm, normal heart sounds and intact distal pulses.  Pulmonary/Chest: Effort normal and breath sounds normal.  Abdominal: Soft. Bowel sounds are normal.  Musculoskeletal: Normal range of motion. He exhibits no edema or tenderness.       Lumbar back: He exhibits normal range of motion, no tenderness and no bony tenderness.       Back:  Reports mild tenderness with striaght leg raises   Neurological: He is alert and oriented to person, place, and time. He displays normal reflexes. No cranial nerve deficit or sensory deficit. He exhibits normal muscle tone. Coordination normal.  Skin: Skin is warm and dry. Capillary refill takes less than 2 seconds.  Psychiatric: He has a normal mood and affect. His behavior is normal. Judgment and thought content normal.    Labs  reviewed: Basic Metabolic Panel: Recent Labs    01/02/17 0346  06/06/17 0806 09/01/17 1500 09/08/17 1634  NA 141   < > 142 137 141  K 3.8   < > 4.2 3.8 4.7  CL 111   < > 108 101 109  CO2 24   < > 27 28 25   GLUCOSE 97   < > 130* 185* 130  BUN 22*   < > 30* 49* 70*  CREATININE 1.55*   < >  1.57* 2.86* 2.37*  CALCIUM 9.3   < > 9.7 9.7 9.9  MG 1.9  --   --   --   --    < > = values in this interval not displayed.   Liver Function Tests: Recent Labs    12/30/16 1143 12/30/16 1807 01/01/17 0426  AST 15 17 12*  ALT 10 14* 10*  ALKPHOS 53 49 37*  BILITOT 0.5 0.4 0.6  PROT 6.5 6.9 5.1*  ALBUMIN 3.8 3.8 2.7*   Recent Labs    12/30/16 1143 12/30/16 1807  LIPASE 24 28  AMYLASE 46  --    No results for input(s): AMMONIA in the last 8760 hours. CBC: Recent Labs    01/06/17 0511 01/10/17 1007 04/21/17 09/01/17 1500  WBC 9.3 10.0 7.2 13.9*  NEUTROABS 5.3 6,400  --  9,800*  HGB 12.2* 12.3* 12.5* 12.3*  HCT 36.7* 36.9* 36* 35.8*  MCV 95.1 96.1  --  93.0  PLT 145* 215 166 194   Lipid Panel: Recent Labs    10/30/16 0844 06/06/17 0806  CHOL 154 173  HDL 39* 50  LDLCALC 94  --   TRIG 103 116  CHOLHDL 3.9 3.5   TSH: No results for input(s): TSH in the last 8760 hours. A1C: Lab Results  Component Value Date   HGBA1C 6.0 (H) 06/06/2017     Assessment/Plan 1. Acute left-sided low back pain with right-sided sciatica -pt a poor historian in regards to description of pain, aggravating factors etc., will have him apply heat to effected area and apply muscle rub after heat TID, able to tolerate NSAIDS due to renal function. To continue tylenol but increase to tylenol 500 mg 2 tablets every 8 hours as needed for pain. May need imaging of lumbar spine if pain persist  2. Herpes zoster without complications Can stop gabapentin and valacyclovir at this time. To notify if pain increases in back/leg with stopping gabapentin   Next appt: 10/30/2017 Carlos American. [place],  [facility] [phone]

## 2017-09-11 NOTE — Patient Instructions (Signed)
Increase tylenol 500 mg to 2 tablets every 8 hours as needed for pain.  To use heating pad to back (effected area) three times daily and then to apply muscle rub after heating pain  If pain has not improved after 1 week or worsen to notify office May need to do imaging of back

## 2017-09-13 ENCOUNTER — Other Ambulatory Visit: Payer: Self-pay | Admitting: Internal Medicine

## 2017-09-15 ENCOUNTER — Telehealth: Payer: Self-pay

## 2017-09-15 ENCOUNTER — Other Ambulatory Visit: Payer: Self-pay

## 2017-09-15 ENCOUNTER — Ambulatory Visit
Admission: RE | Admit: 2017-09-15 | Discharge: 2017-09-15 | Disposition: A | Discharge status: Home / Self Care | Payer: PPO | Source: Ambulatory Visit | Attending: Nurse Practitioner | Admitting: Nurse Practitioner

## 2017-09-15 DIAGNOSIS — M5441 Lumbago with sciatica, right side: Secondary | ICD-10-CM

## 2017-09-15 DIAGNOSIS — M47816 Spondylosis without myelopathy or radiculopathy, lumbar region: Secondary | ICD-10-CM | POA: Diagnosis not present

## 2017-09-15 NOTE — Telephone Encounter (Signed)
Patient called to say that he is still having lower back pain and he would like to know if he needs to come back in or is there something that provider can recommended to help ease the pain.    Please advise.

## 2017-09-15 NOTE — Telephone Encounter (Signed)
I spoke with patient and he verbalized understanding. Order has been placed.

## 2017-09-15 NOTE — Telephone Encounter (Signed)
Lets get an xray of the lumbar spine at this time since he continues to have pain

## 2017-09-16 ENCOUNTER — Other Ambulatory Visit: Payer: Self-pay | Admitting: Nurse Practitioner

## 2017-09-16 DIAGNOSIS — M545 Low back pain, unspecified: Secondary | ICD-10-CM

## 2017-09-18 DIAGNOSIS — H353132 Nonexudative age-related macular degeneration, bilateral, intermediate dry stage: Secondary | ICD-10-CM | POA: Diagnosis not present

## 2017-09-18 DIAGNOSIS — H348322 Tributary (branch) retinal vein occlusion, left eye, stable: Secondary | ICD-10-CM | POA: Diagnosis not present

## 2017-09-18 DIAGNOSIS — H25813 Combined forms of age-related cataract, bilateral: Secondary | ICD-10-CM | POA: Diagnosis not present

## 2017-09-18 DIAGNOSIS — H43811 Vitreous degeneration, right eye: Secondary | ICD-10-CM | POA: Diagnosis not present

## 2017-09-18 LAB — HM DIABETES EYE EXAM

## 2017-09-19 ENCOUNTER — Ambulatory Visit (INDEPENDENT_AMBULATORY_CARE_PROVIDER_SITE_OTHER): Payer: PPO | Admitting: Orthopaedic Surgery

## 2017-09-22 ENCOUNTER — Encounter (INDEPENDENT_AMBULATORY_CARE_PROVIDER_SITE_OTHER): Payer: Self-pay | Admitting: Orthopaedic Surgery

## 2017-09-22 ENCOUNTER — Ambulatory Visit (INDEPENDENT_AMBULATORY_CARE_PROVIDER_SITE_OTHER): Payer: PPO | Admitting: Orthopaedic Surgery

## 2017-09-22 ENCOUNTER — Other Ambulatory Visit: Payer: Self-pay | Admitting: *Deleted

## 2017-09-22 VITALS — BP 113/53 | HR 54 | Resp 16 | Ht 67.0 in | Wt 193.0 lb

## 2017-09-22 DIAGNOSIS — G8929 Other chronic pain: Secondary | ICD-10-CM

## 2017-09-22 DIAGNOSIS — M5136 Other intervertebral disc degeneration, lumbar region: Secondary | ICD-10-CM | POA: Diagnosis not present

## 2017-09-22 DIAGNOSIS — M5441 Lumbago with sciatica, right side: Secondary | ICD-10-CM | POA: Diagnosis not present

## 2017-09-22 DIAGNOSIS — F5101 Primary insomnia: Secondary | ICD-10-CM

## 2017-09-22 MED ORDER — TRAZODONE HCL 100 MG PO TABS: 50.0000 mg | ORAL_TABLET | Freq: Every evening | ORAL | 3 refills | Status: DC | PRN

## 2017-09-22 MED ORDER — TRAMADOL HCL 50 MG PO TABS: 50.0000 mg | ORAL_TABLET | Freq: Two times a day (BID) | ORAL | 0 refills | Status: DC

## 2017-09-22 NOTE — Telephone Encounter (Signed)
Received fax from [facility] for refill.  OV on 09/08/17 medication was in medication list as "no Print".  Faxed.

## 2017-09-22 NOTE — Progress Notes (Signed)
Office Visit Note   Patient: Ian [name]           Date of Birth: 11-Jun-1936           MRN: [id number] Visit Date: 09/22/2017              Requested by: Ian Chandler, NP [place], [place] [address] PCP: Ian Curry, DO   Assessment & Plan: Visit Diagnoses:  1. Chronic bilateral low back pain with right-sided sciatica     Plan: Insidious onset of low back pain [place] 1 month ago.  No injury or trauma.  Sometimes has some pain into his right thigh without a specific pattern.  No bowel or bladder changes.  Films demonstrate degenerative arthritis.  Might have a compression fracture of L4.  Will order an MRI scan and tramadol for pain.  Office after the MRI scan  Follow-Up Instructions: Return after MRI .   Orders:  No orders of the defined types were placed in this encounter.  Meds ordered this encounter  Medications  . traMADol (ULTRAM) 50 MG tablet    Sig: Take 1 tablet (50 mg total) by mouth 2 (two) times daily.    Dispense:  30 tablet    Refill:  0      Procedures: No procedures performed   Clinical Data: No additional findings.   Subjective: Chief Complaint  Patient presents with  . Lower Back - Pain    Ian [name] is 82 y o m here for lower back pain going on for  1 month. No injury  Insidious onset of low back pain approximately a month ago or trauma.  No specific numbness or tingling.  Pain is predominantly in the lumbar spine with some discomfort deferred to his right thigh.  No bowel or bladder dysfunction.  Possible claudication with ambulation.  Tylenol has not been helpful.  Had some trouble sleeping. Had films of his lumbar spine performed through his primary care physician's office.  There is some narrowing of the L5-S1 disc space.  There is some superior endplate sclerosis and possible compression of L4.  There is calcification of the abdominal aorta without obvious aneurysmal dilatation. slight anterior listhesis of L3  HPI  Review of  Systems  Constitutional: Negative for fatigue and fever.  HENT: Negative for ear pain.   Eyes: Negative for pain.  Respiratory: Negative for cough and shortness of breath.   Cardiovascular: Negative for leg swelling.  Gastrointestinal: Negative for blood in stool, constipation and diarrhea.  Genitourinary: Negative for dysuria.  Musculoskeletal: Positive for back pain and neck pain.  Skin: Negative for rash and wound.  Allergic/Immunologic: Negative for food allergies.  Neurological: Positive for numbness. Negative for dizziness, weakness, light-headedness and headaches.  Hematological: Bruises/bleeds easily.  Psychiatric/Behavioral: Negative for sleep disturbance.     Objective: Vital Signs: BP (!) 113/53 (BP Location: Left Arm, Patient Position: Sitting, Cuff Size: Normal)   Pulse (!) 54   Resp 16   Ht 5\' 7"  (4.580 m)   Wt 193 lb (87.5 kg)   BMI 30.23 kg/m   Physical Exam  Ortho Exam awake alert and oriented x3.  Comfortable sitting.  Hamstrings tight bilaterally but negative straight leg raise otherwise.  Neurovascular exam intact.  Reflexes symmetrical.  Some percussible tenderness of the lower lumbar spine without any areas of localization.  Over the sacroiliac joints.  No pain with range of motion of either hip. No Flank pain.  Specialty Comments:  No  specialty comments available.  Imaging: No results found.   PMFS History: Patient Active Problem List   Diagnosis Date Noted  . SBO (small bowel obstruction) ([place]) 12/31/2016  . Early onset Alzheimer's dementia without behavioral disturbance 11/04/2016  . Chronic kidney disease (CKD) stage G3a/A2, moderately decreased glomerular filtration rate (GFR) between 45-59 mL/min/1.73 square meter and albuminuria creatinine ratio between 30-299 mg/g (HCC) 11/23/2015  . Loss of weight 11/23/2015  . Acute upper respiratory infection 07/11/2015  . Cataract, nuclear 08/26/2014  . Obesity (BMI 30-39.9) 02/03/2014  . Chronic kidney  disease (CKD) 10/20/2013  . Hyperlipidemia associated with type 2 diabetes mellitus ([place]) 09/17/2013  . Cellulitis of left hand 02/12/2013  . Edema of hand 01/07/2013  . Depression 01/07/2013  . Insomnia 01/07/2013  . Fluid overload 01/02/2013  . UTI (lower urinary tract infection) 12/29/2012  . Diabetes mellitus ([facility]) 12/29/2012  . Anxiety   . Hypertension associated with diabetes ([facility])   . Hyperlipidemia   . Degenerative drusen 07/07/2012  . COPD with asthma ([employer]) 12/03/2011  . Seasonal allergic rhinitis 12/03/2011  . Branch retinal vein occlusion 06/28/2011  . 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   Past Medical History:  Diagnosis Date  . Allergy   . Alzheimer's disease   . Anemia, unspecified   . Anxiety   . Chronic maxillary sinusitis   . COPD (chronic obstructive pulmonary disease) ([facility])   . Depression   . Diabetes mellitus   . Edema   . Hypercalcemia   . Hyperlipidemia   . Hypertension   . Hypertrophy of prostate without urinary obstruction and other lower urinary tract symptoms (LUTS)   . Hypertrophy of prostate without urinary obstruction and other lower urinary tract symptoms (LUTS)   . Kidney disease    mild  . Unspecified disorder of kidney and ureter   . Unspecified sleep apnea   . Ventral hernia, unspecified, without mention of obstruction or gangrene     Family History  Problem Relation Age of Onset  . Pancreatic cancer Sister   . Heart disease Father   . Hypertension Brother   . Hypertension Sister     Past Surgical History:  Procedure Laterality Date  . EXPLORATORY LAPAROTOMY  2002  . HERNIA REPAIR     Social History   Occupational History  . Occupation: retired    Comment: [place]  Tobacco Use  . Smoking status: Former Smoker    Years: 30.00    Last attempt to quit: 07/08/1988    Years since quitting: 29.2  . Smokeless tobacco: Never Used  Substance and Sexual Activity  . Alcohol use: No  . Drug use: No  . Sexual activity: Not Currently

## 2017-09-25 ENCOUNTER — Ambulatory Visit
Admission: RE | Admit: 2017-09-25 | Discharge: 2017-09-25 | Disposition: A | Discharge status: Home / Self Care | Payer: PPO | Source: Ambulatory Visit | Attending: Orthopaedic Surgery | Admitting: Orthopaedic Surgery

## 2017-09-25 DIAGNOSIS — M48061 Spinal stenosis, lumbar region without neurogenic claudication: Secondary | ICD-10-CM | POA: Diagnosis not present

## 2017-09-25 DIAGNOSIS — M5136 Other intervertebral disc degeneration, lumbar region: Secondary | ICD-10-CM

## 2017-09-26 ENCOUNTER — Other Ambulatory Visit: Payer: Self-pay

## 2017-09-26 MED ORDER — MONTELUKAST SODIUM 10 MG PO TABS: 10.0000 mg | ORAL_TABLET | Freq: Every day | ORAL | 3 refills | Status: DC

## 2017-10-01 ENCOUNTER — Ambulatory Visit (INDEPENDENT_AMBULATORY_CARE_PROVIDER_SITE_OTHER): Payer: PPO | Admitting: Nurse Practitioner

## 2017-10-01 ENCOUNTER — Encounter: Payer: Self-pay | Admitting: Nurse Practitioner

## 2017-10-01 ENCOUNTER — Ambulatory Visit
Admission: RE | Admit: 2017-10-01 | Discharge: 2017-10-01 | Disposition: A | Discharge status: Home / Self Care | Payer: PPO | Source: Ambulatory Visit | Attending: Nurse Practitioner | Admitting: Nurse Practitioner

## 2017-10-01 VITALS — BP 138/64 | HR 67 | Temp 98.5°F | Ht 67.0 in | Wt 186.8 lb

## 2017-10-01 DIAGNOSIS — J40 Bronchitis, not specified as acute or chronic: Secondary | ICD-10-CM | POA: Diagnosis not present

## 2017-10-01 DIAGNOSIS — R05 Cough: Secondary | ICD-10-CM | POA: Diagnosis not present

## 2017-10-01 LAB — CBC WITH DIFFERENTIAL/PLATELET
BASOS ABS: 67 {cells}/uL (ref 0–200)
Basophils Relative: 0.4 %
EOS PCT: 0.4 %
Eosinophils Absolute: 67 cells/uL (ref 15–500)
HCT: 37.5 % — ABNORMAL LOW (ref 38.5–50.0)
Hemoglobin: 13 g/dL — ABNORMAL LOW (ref 13.2–17.1)
Lymphs Abs: 2470 cells/uL (ref 850–3900)
MCH: 32.5 pg (ref 27.0–33.0)
MCHC: 34.7 g/dL (ref 32.0–36.0)
MCV: 93.8 fL (ref 80.0–100.0)
MONOS PCT: 9.8 %
MPV: 9.8 fL (ref 7.5–12.5)
NEUTROS PCT: 74.7 %
Neutro Abs: 12550 cells/uL — ABNORMAL HIGH (ref 1500–7800)
Platelets: 215 10*3/uL (ref 140–400)
RBC: 4 10*6/uL — AB (ref 4.20–5.80)
RDW: 14.1 % (ref 11.0–15.0)
Total Lymphocyte: 14.7 %
WBC mixed population: 1646 cells/uL — ABNORMAL HIGH (ref 200–950)
WBC: 16.8 10*3/uL — AB (ref 3.8–10.8)

## 2017-10-01 NOTE — Patient Instructions (Addendum)
mucinex DM by mouth twice daily with full glass of water x 7 days for cough and congestion.  Tylenol 325 mg 2 tablets every 6 hours as needed for sore throat/body aches/fever To get chest xray when you leave here   Acute Bronchitis, Adult Acute bronchitis is when air tubes (bronchi) in the lungs suddenly get swollen. The condition can make it hard to breathe. It can also cause these symptoms:  A cough.  Coughing up clear, yellow, or green mucus.  Wheezing.  Chest congestion.  Shortness of breath.  A fever.  Body aches.  Chills.  A sore throat.  Follow these instructions at home: Medicines  Take over-the-counter and prescription medicines only as told by your doctor.  If you were prescribed an antibiotic medicine, take it as told by your doctor. Do not stop taking the antibiotic even if you start to feel better. General instructions  Rest.  Drink enough fluids to keep your pee (urine) clear or pale yellow.  Avoid smoking and secondhand smoke. If you smoke and you need help quitting, ask your doctor. Quitting will help your lungs heal faster.  Use an inhaler, cool mist vaporizer, or humidifier as told by your doctor.  Keep all follow-up visits as told by your doctor. This is important. How is this prevented? To lower your risk of getting this condition again:  Wash your hands often with soap and water. If you cannot use soap and water, use hand sanitizer.  Avoid contact with people who have cold symptoms.  Try not to touch your hands to your mouth, nose, or eyes.  Make sure to get the flu shot every year.  Contact a doctor if:  Your symptoms do not get better in 2 weeks. Get help right away if:  You cough up blood.  You have chest pain.  You have very bad shortness of breath.  You become dehydrated.  You faint (pass out) or keep feeling like you are going to pass out.  You keep throwing up (vomiting).  You have a very bad headache.  Your fever or  chills gets worse. This information is not intended to replace advice given to you by your health care provider. Make sure you discuss any questions you have with your health care provider. Document Released: 12/11/2007 Document Revised: 01/31/2016 Document Reviewed: 12/13/2015 Elsevier Interactive Patient Education  [employer].

## 2017-10-01 NOTE — Progress Notes (Signed)
Careteam: Patient Care Team: Gayland Curry, DO as PCP - General (Geriatric Medicine) Zebedee Iba., MD as Referring Physician (Ophthalmology) Chesley Mires, MD as Consulting Physician (Pulmonary Disease)  Advanced Directive information    Allergies  Allergen Reactions  . Ace Inhibitors Cough    Chief Complaint  Patient presents with  . Acute Visit    Chest Congestion, SOB, Cough     HPI: Patient is a 82 y.o. male seen in the office today chest congestion and cough.  Reports feeling bad since yesterday. Reports increase in cough and congestion.  No fevers or chills.  Reports some shortness of breath this morning. Denies shortness of breath with activity or walking Reports O2 sats this morning was 88%, they have a home device 97% in office  Used a pink cough syrup which helped.   Review of Systems:  Review of Systems  Constitutional: Negative for chills, fever and malaise/fatigue.  HENT: Positive for sore throat. Negative for congestion, ear discharge, ear pain and nosebleeds.   Respiratory: Positive for cough, sputum production and shortness of breath.   Cardiovascular: Negative for chest pain and palpitations.  Musculoskeletal: Negative for myalgias.  Neurological: Negative for dizziness and headaches.    Past Medical History:  Diagnosis Date  . Allergy   . Alzheimer's disease   . Anemia, unspecified   . Anxiety   . Chronic maxillary sinusitis   . COPD (chronic obstructive pulmonary disease) ([facility])   . Depression   . Diabetes mellitus   . Edema   . Hypercalcemia   . Hyperlipidemia   . Hypertension   . Hypertrophy of prostate without urinary obstruction and other lower urinary tract symptoms (LUTS)   . Hypertrophy of prostate without urinary obstruction and other lower urinary tract symptoms (LUTS)   . Kidney disease    mild  . Unspecified disorder of kidney and ureter   . Unspecified sleep apnea   . Ventral hernia, unspecified, without mention of  obstruction or gangrene    Past Surgical History:  Procedure Laterality Date  . EXPLORATORY LAPAROTOMY  2002  . HERNIA REPAIR     Social History:   reports that he quit smoking about 29 years ago. He quit after 30.00 years of use. He has never used smokeless tobacco. He reports that he does not drink alcohol or use drugs.  Family History  Problem Relation Age of Onset  . Pancreatic cancer Sister   . Heart disease Father   . Hypertension Brother   . Hypertension Sister     Medications: Patient's Medications  New Prescriptions   No medications on file  Previous Medications   ALBUTEROL (PROVENTIL HFA;VENTOLIN HFA) 108 (90 BASE) MCG/ACT INHALER    Inhale 2 puffs into the lungs every 6 (six) hours as needed for wheezing or shortness of breath.   AMLODIPINE (NORVASC) 10 MG TABLET    Take 1 tablet by mouth daily   ASPIRIN EC 81 MG TABLET    Take 1 tablet (81 mg total) by mouth daily.   ATORVASTATIN (LIPITOR) 20 MG TABLET    Take one tablet by mouth once daily for cholesterol   GABAPENTIN (NEURONTIN) 100 MG CAPSULE    Take 1 capsule (100 mg total) by mouth 3 (three) times daily.   GLUCOSE BLOOD (ONE TOUCH ULTRA TEST) TEST STRIP    Use as instructed to test blood sugar once daily dx E119   HYDRALAZINE (APRESOLINE) 25 MG TABLET    Take 1 tablet (25 mg  total) by mouth 2 (two) times daily.   IPRATROPIUM-ALBUTEROL (DUONEB) 0.5-2.5 (3) MG/3ML SOLN    Take 3 mLs by nebulization every 6 (six) hours as needed (wheezing).   LANCETS (ONETOUCH ULTRASOFT) LANCETS    Use to test blood sugar up to three times daily. Dx E11.9   LINAGLIPTIN (TRADJENTA) 5 MG TABS TABLET    TAKE 1 TABLET ONE TIME DAILY TO CONTROL BLOOD SUGAR   LOSARTAN (COZAAR) 25 MG TABLET    Take 25 mg by mouth daily.   METOPROLOL TARTRATE (LOPRESSOR) 50 MG TABLET    Take 1 tablet (50 mg total) by mouth 2 (two) times daily.   MONTELUKAST (SINGULAIR) 10 MG TABLET    Take 1 tablet (10 mg total) by mouth at bedtime.   PAROXETINE (PAXIL) 20  MG TABLET    Take 1 tablet by mouth once daily for depression   POLYETHYLENE GLYCOL POWDER (GLYCOLAX/MIRALAX) POWDER    Take 17 g by mouth every 3 (three) days. For constipation; hold for loose stools   TRAMADOL (ULTRAM) 50 MG TABLET    Take 1 tablet (50 mg total) by mouth 2 (two) times daily.   TRAZODONE (DESYREL) 100 MG TABLET    Take 0.5 tablets (50 mg total) by mouth at bedtime as needed. for sleep  Modified Medications   No medications on file  Discontinued Medications   No medications on file     Physical Exam:  Vitals:   10/01/17 1248  BP: 138/64  Pulse: 67  Temp: 98.5 F (36.9 C)  TempSrc: Oral  SpO2: 97%  Weight: 186 lb 12.8 oz (84.7 kg)  Height: 5\' 7"  (1.702 m)   Body mass index is 29.26 kg/m.  Physical Exam  Constitutional: He is oriented to person, place, and time. He appears well-developed and well-nourished. No distress.  HENT:  Head: Normocephalic and atraumatic.  Right Ear: Tympanic membrane, external ear and ear canal normal.  Left Ear: Tympanic membrane, external ear and ear canal normal.  Nose: Mucosal edema and rhinorrhea present.  Mouth/Throat: Oropharynx is clear and moist. No oropharyngeal exudate.  Eyes: Pupils are equal, round, and reactive to light. Conjunctivae and EOM are normal.  Neck: Normal range of motion. Neck supple.  Cardiovascular: Normal rate, regular rhythm and normal heart sounds.  Pulmonary/Chest: Effort normal and breath sounds normal. No respiratory distress. He has no wheezes.  Musculoskeletal: He exhibits no edema.  Lymphadenopathy:    He has no cervical adenopathy.  Neurological: He is alert and oriented to person, place, and time.  Short term memory loss  Skin: Skin is warm and dry. Capillary refill takes less than 2 seconds.  Psychiatric: He has a normal mood and affect.    Labs reviewed: Basic Metabolic Panel: Recent Labs    01/02/17 0346  06/06/17 0806 09/01/17 1500 09/08/17 1634  NA 141   < > 142 137 141  K 3.8    < > 4.2 3.8 4.7  CL 111   < > 108 101 109  CO2 24   < > 27 28 25   GLUCOSE 97   < > 130* 185* 130  BUN 22*   < > 30* 49* 70*  CREATININE 1.55*   < > 1.57* 2.86* 2.37*  CALCIUM 9.3   < > 9.7 9.7 9.9  MG 1.9  --   --   --   --    < > = values in this interval not displayed.   Liver Function Tests: Recent Labs  12/30/16 1143 12/30/16 1807 01/01/17 0426  AST 15 17 12*  ALT 10 14* 10*  ALKPHOS 53 49 37*  BILITOT 0.5 0.4 0.6  PROT 6.5 6.9 5.1*  ALBUMIN 3.8 3.8 2.7*   Recent Labs    12/30/16 1143 12/30/16 1807  LIPASE 24 28  AMYLASE 46  --    No results for input(s): AMMONIA in the last 8760 hours. CBC: Recent Labs    01/06/17 0511 01/10/17 1007 04/21/17 09/01/17 1500  WBC 9.3 10.0 7.2 13.9*  NEUTROABS 5.3 6,400  --  9,800*  HGB 12.2* 12.3* 12.5* 12.3*  HCT 36.7* 36.9* 36* 35.8*  MCV 95.1 96.1  --  93.0  PLT 145* 215 166 194   Lipid Panel: Recent Labs    10/30/16 0844 06/06/17 0806  CHOL 154 173  HDL 39* 50  LDLCALC 94 102*  TRIG 103 116  CHOLHDL 3.9 3.5   TSH: No results for input(s): TSH in the last 8760 hours. A1C: Lab Results  Component Value Date   HGBA1C 6.0 (H) 06/06/2017     Assessment/Plan 1. Bronchitis - DG Chest 2 View- recently with pneumonia however improved from this, however had improved, yesterday started with increase congestion and cough.  No fevers noted. To use MUCINEX DM by mouth twice daily with full glass of water, increase hydration  -to use tylenol 650 mg by mouth every 6 hours as needed sore throat -walked pt in office and O2 sats maintained at 97%, no increase in shortness of breath noted.  - CBC with Differential/Platelets  Carlos American. [place], [facility] [phone]

## 2017-10-02 ENCOUNTER — Other Ambulatory Visit: Payer: Self-pay

## 2017-10-02 ENCOUNTER — Other Ambulatory Visit: Payer: Self-pay | Admitting: Nurse Practitioner

## 2017-10-02 ENCOUNTER — Ambulatory Visit: Payer: PPO | Admitting: Internal Medicine

## 2017-10-02 DIAGNOSIS — D72829 Elevated white blood cell count, unspecified: Secondary | ICD-10-CM

## 2017-10-02 DIAGNOSIS — N183 Chronic kidney disease, stage 3 (moderate): Principal | ICD-10-CM

## 2017-10-02 DIAGNOSIS — N1831 Chronic kidney disease, stage 3a: Secondary | ICD-10-CM

## 2017-10-02 MED ORDER — MOXIFLOXACIN HCL 400 MG PO TABS: 400.0000 mg | ORAL_TABLET | Freq: Every day | ORAL | 0 refills | Status: DC

## 2017-10-06 ENCOUNTER — Encounter (INDEPENDENT_AMBULATORY_CARE_PROVIDER_SITE_OTHER): Payer: Self-pay | Admitting: Orthopaedic Surgery

## 2017-10-06 ENCOUNTER — Ambulatory Visit (INDEPENDENT_AMBULATORY_CARE_PROVIDER_SITE_OTHER): Payer: PPO | Admitting: Orthopaedic Surgery

## 2017-10-06 VITALS — BP 95/43 | HR 53 | Resp 20 | Ht 66.0 in | Wt 187.0 lb

## 2017-10-06 DIAGNOSIS — M5441 Lumbago with sciatica, right side: Secondary | ICD-10-CM | POA: Diagnosis not present

## 2017-10-06 DIAGNOSIS — G8929 Other chronic pain: Secondary | ICD-10-CM | POA: Insufficient documentation

## 2017-10-06 NOTE — Progress Notes (Signed)
Office Visit Note   Patient: Ian [name]           Date of Birth: 18-May-1936           MRN: [id number] Visit Date: 10/06/2017              Requested by: Gayland Curry, DO [place], [place] [address] PCP: Gayland Curry, DO   Assessment & Plan: Visit Diagnoses:  1. Chronic right-sided low back pain with right-sided sciatica     Plan: MRI scan reveals mild multilevel lumbar degenerative disc disease.  There is mild spinal canal stenosis at L1 to and mild right L2-3 lateral recess and neural foraminal stenosis.  Presently Ian [name] is comfortable without significant back pain or lower extremity referred pain.  Long discussion regarding MRI scan report and need for exercise and potential for recurrence over time.  Will see back as needed  Follow-Up Instructions: Return if symptoms worsen or fail to improve.   Orders:  No orders of the defined types were placed in this encounter.  No orders of the defined types were placed in this encounter.     Procedures: No procedures performed   Clinical Data: No additional findings.   Subjective: Chief Complaint  Patient presents with  . Results    MRI results  Ian [name] relates that he is feeling better.  He is not having any of the right lower extremity pain minimal back discomfort.  He feels that "time healed my back pain".  He recently had a "bout of bronchitis which is slowly going away".  HPI  Review of Systems  Constitutional: Positive for activity change.  HENT: Negative for trouble swallowing.   Eyes: Negative for pain.  Respiratory: Positive for cough.   Gastrointestinal: Negative for constipation.  Endocrine: Negative for cold intolerance.  Genitourinary: Negative for difficulty urinating.  Musculoskeletal: Negative for back pain.  Skin: Negative for rash.  Allergic/Immunologic: Negative for food allergies.  Neurological: Negative for numbness.  Hematological: Does not bruise/bleed easily.    Psychiatric/Behavioral: Negative for sleep disturbance.     Objective: Vital Signs: BP (!) 95/43 (BP Location: Right Arm, Patient Position: Sitting, Cuff Size: Normal)   Pulse (!) 53   Resp 20   Ht 5\' 6"  (1.676 m)   Wt 187 lb (84.8 kg)   BMI 30.18 kg/m   Physical Exam  Ortho Exam awake alert and oriented x3.  Comfortable sitting leg raise negative bilaterally.  Walks without a limp.  No percussible tenderness of the lumbar spine pain with range of motion of either hip.  No swelling distally  Specialty Comments:  No specialty comments available.  Imaging: No results found.   PMFS History: Patient Active Problem List   Diagnosis Date Noted  . Chronic right-sided low back pain with right-sided sciatica 10/06/2017  . SBO (small bowel obstruction) ([place]) 12/31/2016  . Early onset Alzheimer's dementia without behavioral disturbance 11/04/2016  . Chronic kidney disease (CKD) stage G3a/A2, moderately decreased glomerular filtration rate (GFR) between 45-59 mL/min/1.73 square meter and albuminuria creatinine ratio between 30-299 mg/g ([facility]) 11/23/2015  . Loss of weight 11/23/2015  . Acute upper respiratory infection 07/11/2015  . Cataract, nuclear 08/26/2014  . Obesity (BMI 30-39.9) 02/03/2014  . Chronic kidney disease (CKD) 10/20/2013  . Hyperlipidemia associated with type 2 diabetes mellitus ([place] [facility]) 09/17/2013  . Cellulitis of left hand 02/12/2013  . Edema of hand 01/07/2013  . Depression 01/07/2013  . Insomnia 01/07/2013  . Fluid overload 01/02/2013  .  UTI (lower urinary tract infection) 12/29/2012  . Diabetes mellitus ([facility]) 12/29/2012  . Anxiety   . Hypertension associated with diabetes ([place])   . Hyperlipidemia   . Degenerative drusen 07/07/2012  . COPD with asthma ([place]) 12/03/2011  . Seasonal allergic rhinitis 12/03/2011  . Branch retinal vein occlusion 06/28/2011  . 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   Past Medical History:  Diagnosis Date  . Allergy   . Alzheimer's  disease   . Anemia, unspecified   . Anxiety   . Chronic maxillary sinusitis   . COPD (chronic obstructive pulmonary disease) ([place])   . Depression   . Diabetes mellitus   . Edema   . Hypercalcemia   . Hyperlipidemia   . Hypertension   . Hypertrophy of prostate without urinary obstruction and other lower urinary tract symptoms (LUTS)   . Hypertrophy of prostate without urinary obstruction and other lower urinary tract symptoms (LUTS)   . Kidney disease    mild  . Unspecified disorder of kidney and ureter   . Unspecified sleep apnea   . Ventral hernia, unspecified, without mention of obstruction or gangrene     Family History  Problem Relation Age of Onset  . Pancreatic cancer Sister   . Heart disease Father   . Hypertension Brother   . Hypertension Sister     Past Surgical History:  Procedure Laterality Date  . EXPLORATORY LAPAROTOMY  2002  . HERNIA REPAIR     Social History   Occupational History  . Occupation: retired    Comment: [employer]  Tobacco Use  . Smoking status: Former Smoker    Packs/day: 1.50    Years: 30.00    Pack years: 45.00    Types: Cigarettes    Last attempt to quit: 07/08/1988    Years since quitting: 29.2  . Smokeless tobacco: Never Used  Substance and Sexual Activity  . Alcohol use: No  . Drug use: No  . Sexual activity: Not Currently

## 2017-10-09 ENCOUNTER — Other Ambulatory Visit: Payer: PPO

## 2017-10-09 DIAGNOSIS — E1122 Type 2 diabetes mellitus with diabetic chronic kidney disease: Secondary | ICD-10-CM

## 2017-10-09 DIAGNOSIS — D72829 Elevated white blood cell count, unspecified: Secondary | ICD-10-CM

## 2017-10-09 DIAGNOSIS — N183 Chronic kidney disease, stage 3 (moderate): Principal | ICD-10-CM

## 2017-10-09 DIAGNOSIS — N1831 Chronic kidney disease, stage 3a: Secondary | ICD-10-CM

## 2017-10-09 LAB — BASIC METABOLIC PANEL WITH GFR
BUN / CREAT RATIO: 13 (calc) (ref 6–22)
BUN: 29 mg/dL — AB (ref 7–25)
CHLORIDE: 108 mmol/L (ref 98–110)
CO2: 29 mmol/L (ref 20–32)
Calcium: 9.5 mg/dL (ref 8.6–10.3)
Creat: 2.21 mg/dL — ABNORMAL HIGH (ref 0.70–1.18)
GFR, Est African American: 32 mL/min/{1.73_m2} — ABNORMAL LOW (ref >=60)
GFR, Est Non African American: 27 mL/min/{1.73_m2} — ABNORMAL LOW (ref >=60)
GLUCOSE: 162 mg/dL — AB (ref 65–99)
Potassium: 4.4 mmol/L (ref 3.5–5.3)
SODIUM: 141 mmol/L (ref 135–146)

## 2017-10-09 LAB — CBC WITH DIFFERENTIAL/PLATELET
BASOS PCT: 1.1 %
Basophils Absolute: 114 cells/uL (ref 0–200)
EOS ABS: 603 {cells}/uL — AB (ref 15–500)
EOS PCT: 5.8 %
HCT: 36 % — ABNORMAL LOW (ref 38.5–50.0)
Hemoglobin: 12.4 g/dL — ABNORMAL LOW (ref 13.2–17.1)
Lymphs Abs: 1976 cells/uL (ref 850–3900)
MCH: 32.4 pg (ref 27.0–33.0)
MCHC: 34.4 g/dL (ref 32.0–36.0)
MCV: 94 fL (ref 80.0–100.0)
MONOS PCT: 8.8 %
MPV: 9.4 fL (ref 7.5–12.5)
Neutro Abs: 6791 cells/uL (ref 1500–7800)
Neutrophils Relative %: 65.3 %
PLATELETS: 238 10*3/uL (ref 140–400)
RBC: 3.83 10*6/uL — ABNORMAL LOW (ref 4.20–5.80)
RDW: 13.8 % (ref 11.0–15.0)
TOTAL LYMPHOCYTE: 19 %
WBC mixed population: 915 cells/uL (ref 200–950)
WBC: 10.4 10*3/uL (ref 3.8–10.8)

## 2017-10-13 ENCOUNTER — Ambulatory Visit: Payer: PPO | Admitting: Internal Medicine

## 2017-10-14 ENCOUNTER — Other Ambulatory Visit: Payer: Self-pay | Admitting: Internal Medicine

## 2017-10-14 ENCOUNTER — Encounter: Payer: Self-pay | Admitting: Nurse Practitioner

## 2017-10-14 ENCOUNTER — Ambulatory Visit (INDEPENDENT_AMBULATORY_CARE_PROVIDER_SITE_OTHER): Payer: PPO | Admitting: Nurse Practitioner

## 2017-10-14 VITALS — BP 118/58 | HR 70 | Temp 98.4°F | Ht 66.0 in | Wt 187.0 lb

## 2017-10-14 DIAGNOSIS — R05 Cough: Secondary | ICD-10-CM

## 2017-10-14 DIAGNOSIS — R058 Other specified cough: Secondary | ICD-10-CM

## 2017-10-14 DIAGNOSIS — E0800 Diabetes mellitus due to underlying condition with hyperosmolarity without nonketotic hyperglycemic-hyperosmolar coma (NKHHC): Secondary | ICD-10-CM

## 2017-10-14 DIAGNOSIS — N1831 Chronic kidney disease, stage 3a: Secondary | ICD-10-CM

## 2017-10-14 DIAGNOSIS — E7800 Pure hypercholesterolemia, unspecified: Secondary | ICD-10-CM

## 2017-10-14 DIAGNOSIS — N183 Chronic kidney disease, stage 3 (moderate): Principal | ICD-10-CM

## 2017-10-14 MED ORDER — PREDNISONE 10 MG (21) PO TBPK: ORAL_TABLET | ORAL | 0 refills | Status: DC

## 2017-10-14 NOTE — Progress Notes (Signed)
Due to patient's appointment for his labs being changed to a later date than ordered at his last appointment, labs expired and had to be reentered.

## 2017-10-14 NOTE — Progress Notes (Signed)
Careteam: Patient Care Team: Gayland Curry, DO as PCP - General (Geriatric Medicine) Zebedee Iba., MD as Referring Physician (Ophthalmology) Chesley Mires, MD as Consulting Physician (Pulmonary Disease)  Advanced Directive information    Allergies  Allergen Reactions  . Ace Inhibitors Cough    Chief Complaint  Patient presents with  . Acute Visit    Pt is being seen due to bothersome cough. He states he is taking mucinex but needs something stronger to break up the mucus.      HPI: Patient is a 82 y.o. male seen in the office today due to ongoing cough.  Reports he is taking plain mucinex but needs something to help with cough. Taking cough syrup but does not help enough.  Blood sugars have been up.  Overall feels better.  WBC  was rechecked on 4/4- 10.4 after elevated WBC on 10/01/17 at 16.8 Does not drink enough water- states his wife gets onto him about this.  Xray reviewed with pt as well. Occasionally will wheeze, denies shortness of breath  Review of Systems:  Review of Systems  Constitutional: Negative for chills, fever and malaise/fatigue.  HENT: Negative for congestion, ear pain, sinus pain and sore throat.   Respiratory: Positive for cough, sputum production and wheezing. Negative for hemoptysis and shortness of breath.   Cardiovascular: Negative for chest pain and palpitations.  Neurological: Positive for weakness (but improving).   Past Medical History:  Diagnosis Date  . Allergy   . Alzheimer's disease   . Anemia, unspecified   . Anxiety   . Chronic maxillary sinusitis   . COPD (chronic obstructive pulmonary disease) ([place])   . Depression   . Diabetes mellitus   . Edema   . Hypercalcemia   . Hyperlipidemia   . Hypertension   . Hypertrophy of prostate without urinary obstruction and other lower urinary tract symptoms (LUTS)   . Hypertrophy of prostate without urinary obstruction and other lower urinary tract symptoms (LUTS)   . Kidney disease     mild  . Unspecified disorder of kidney and ureter   . Unspecified sleep apnea   . Ventral hernia, unspecified, without mention of obstruction or gangrene    Past Surgical History:  Procedure Laterality Date  . EXPLORATORY LAPAROTOMY  2002  . HERNIA REPAIR     Social History:   reports that he quit smoking about 29 years ago. His smoking use included cigarettes. He has a 45.00 pack-year smoking history. He has never used smokeless tobacco. He reports that he does not drink alcohol or use drugs.  Family History  Problem Relation Age of Onset  . Pancreatic cancer Sister   . Heart disease Father   . Hypertension Brother   . Hypertension Sister     Medications: Patient's Medications  New Prescriptions   No medications on file  Previous Medications   ALBUTEROL (PROVENTIL HFA;VENTOLIN HFA) 108 (90 BASE) MCG/ACT INHALER    Inhale 2 puffs into the lungs every 6 (six) hours as needed for wheezing or shortness of breath.   AMLODIPINE (NORVASC) 10 MG TABLET    Take 1 tablet by mouth daily   ASPIRIN EC 81 MG TABLET    Take 1 tablet (81 mg total) by mouth daily.   ATORVASTATIN (LIPITOR) 20 MG TABLET    Take one tablet by mouth once daily for cholesterol   GABAPENTIN (NEURONTIN) 100 MG CAPSULE    Take 1 capsule (100 mg total) by mouth 3 (three) times daily.  GLUCOSE BLOOD (ONE TOUCH ULTRA TEST) TEST STRIP    Use as instructed to test blood sugar once daily dx E119   HYDRALAZINE (APRESOLINE) 25 MG TABLET    Take 1 tablet (25 mg total) by mouth 2 (two) times daily.   IPRATROPIUM-ALBUTEROL (DUONEB) 0.5-2.5 (3) MG/3ML SOLN    Take 3 mLs by nebulization every 6 (six) hours as needed (wheezing).   LANCETS (ONETOUCH ULTRASOFT) LANCETS    Use to test blood sugar up to three times daily. Dx E11.9   LINAGLIPTIN (TRADJENTA) 5 MG TABS TABLET    TAKE 1 TABLET ONE TIME DAILY TO CONTROL BLOOD SUGAR   LOSARTAN (COZAAR) 25 MG TABLET    Take 25 mg by mouth daily.   METOPROLOL TARTRATE (LOPRESSOR) 50 MG  TABLET    Take 1 tablet (50 mg total) by mouth 2 (two) times daily.   MONTELUKAST (SINGULAIR) 10 MG TABLET    Take 1 tablet (10 mg total) by mouth at bedtime.   MOXIFLOXACIN (AVELOX) 400 MG TABLET    Take 1 tablet (400 mg total) by mouth daily.   PAROXETINE (PAXIL) 20 MG TABLET    Take 1 tablet by mouth once daily for depression   POLYETHYLENE GLYCOL POWDER (GLYCOLAX/MIRALAX) POWDER    Take 17 g by mouth every 3 (three) days. For constipation; hold for loose stools   TRAMADOL (ULTRAM) 50 MG TABLET    Take 1 tablet (50 mg total) by mouth 2 (two) times daily.   TRAZODONE (DESYREL) 100 MG TABLET    Take 0.5 tablets (50 mg total) by mouth at bedtime as needed. for sleep  Modified Medications   No medications on file  Discontinued Medications   No medications on file     Physical Exam:  Vitals:   10/14/17 1130  BP: (!) 118/58  Pulse: 70  Temp: 98.4 F (36.9 C)  TempSrc: Oral  SpO2: 98%  Weight: 187 lb (84.8 kg)  Height: 5\' 6"  (1.676 m)   Body mass index is 30.18 kg/m.  Physical Exam  Constitutional: He is oriented to person, place, and time. He appears well-developed and well-nourished. No distress.  HENT:  Head: Normocephalic and atraumatic.  Right Ear: Tympanic membrane, external ear and ear canal normal.  Left Ear: Tympanic membrane, external ear and ear canal normal.  Nose: Nose normal.  Mouth/Throat: Oropharynx is clear and moist. No oropharyngeal exudate.  Eyes: Pupils are equal, round, and reactive to light. Conjunctivae and EOM are normal.  Neck: Normal range of motion. Neck supple.  Cardiovascular: Normal rate, regular rhythm and normal heart sounds.  Pulmonary/Chest: Effort normal. No respiratory distress. He has wheezes.  Scattered wheezing that comes and goes noted to right upper lobe and bilateral bases; otherwise breath sounds clear  Musculoskeletal: He exhibits no edema.  Lymphadenopathy:    He has no cervical adenopathy.  Neurological: He is alert and oriented  to person, place, and time.  Short term memory loss  Skin: Skin is warm and dry. Capillary refill takes less than 2 seconds.  Psychiatric: He has a normal mood and affect.    Labs reviewed: Basic Metabolic Panel: Recent Labs    01/02/17 0346  09/01/17 1500 09/08/17 1634 10/09/17 1137  NA 141   < > 137 141 141  K 3.8   < > 3.8 4.7 4.4  CL 111   < > 101 109 108  CO2 24   < > 28 25 29   GLUCOSE 97   < > 185* 130  162*  BUN 22*   < > 49* 70* 29*  CREATININE 1.55*   < > 2.86* 2.37* 2.21*  CALCIUM 9.3   < > 9.7 9.9 9.5  MG 1.9  --   --   --   --    < > = values in this interval not displayed.   Liver Function Tests: Recent Labs    12/30/16 1143 12/30/16 1807 01/01/17 0426  AST 15 17 12*  ALT 10 14* 10*  ALKPHOS 53 49 37*  BILITOT 0.5 0.4 0.6  PROT 6.5 6.9 5.1*  ALBUMIN 3.8 3.8 2.7*   Recent Labs    12/30/16 1143 12/30/16 1807  LIPASE 24 28  AMYLASE 46  --    No results for input(s): AMMONIA in the last 8760 hours. CBC: Recent Labs    09/01/17 1500 10/01/17 1400 10/09/17 1137  WBC 13.9* 16.8* 10.4  NEUTROABS 9,800* 12,550* 6,791  HGB 12.3* 13.0* 12.4*  HCT 35.8* 37.5* 36.0*  MCV 93.0 93.8 94.0  PLT 194 215 238   Lipid Panel: Recent Labs    10/30/16 0844 06/06/17 0806  CHOL 154 173  HDL 39* 50  LDLCALC 94 102*  TRIG 103 116  CHOLHDL 3.9 3.5   TSH: No results for input(s): TSH in the last 8760 hours. A1C: Lab Results  Component Value Date   HGBA1C 6.0 (H) 06/06/2017     Assessment/Plan 1. Post-viral cough syndrome -overall doing better however still with increase mucous production and cough, notes occasional wheezing - predniSONE (STERAPRED UNI-PAK 21 TAB) 10 MG (21) TBPK tablet; Use as directed  Dispense: 21 tablet; Refill: 0 -discussed to limit sweets, prednisone will increase blood sugar -to increase water intake -mucinex DM by mouth twice daily with full glass of water for 7 days and to stop cough syrup   Next appt: 10/30/2017, sooner if  needed, return precautions discussed  Janett Billow K. [place], [facility] [phone]

## 2017-10-14 NOTE — Patient Instructions (Signed)
To start prednisone as directed  To use MUCINEX DM1-2 tablets by mouth twice daily for 7 days with full glass of water then as needed    Cough, Adult A cough helps to clear your throat and lungs. A cough may last only 2-3 weeks (acute), or it may last longer than 8 weeks (chronic). Many different things can cause a cough. A cough may be a sign of an illness or another medical condition. Follow these instructions at home:  Pay attention to any changes in your cough.  Take medicines only as told by your doctor. ? If you were prescribed an antibiotic medicine, take it as told by your doctor. Do not stop taking it even if you start to feel better. ? Talk with your doctor before you try using a cough medicine.  Drink enough fluid to keep your pee (urine) clear or pale yellow.  If the air is dry, use a cold steam vaporizer or humidifier in your home.  Stay away from things that make you cough at work or at home.  If your cough is worse at night, try using extra pillows to raise your head up higher while you sleep.  Do not smoke, and try not to be around smoke. If you need help quitting, ask your doctor.  Do not have caffeine.  Do not drink alcohol.  Rest as needed. Contact a doctor if:  You have new problems (symptoms).  You cough up yellow fluid (pus).  Your cough does not get better after 2-3 weeks, or your cough gets worse.  Medicine does not help your cough and you are not sleeping well.  You have pain that gets worse or pain that is not helped with medicine.  You have a fever.  You are losing weight and you do not know why.  You have night sweats. Get help right away if:  You cough up blood.  You have trouble breathing.  Your heartbeat is very fast. This information is not intended to replace advice given to you by your health care provider. Make sure you discuss any questions you have with your health care provider. Document Released: 03/07/2011 Document  Revised: 11/30/2015 Document Reviewed: 08/31/2014 Elsevier Interactive Patient Education  [employer].

## 2017-10-20 ENCOUNTER — Ambulatory Visit (INDEPENDENT_AMBULATORY_CARE_PROVIDER_SITE_OTHER): Payer: PPO | Admitting: Pulmonary Disease

## 2017-10-20 ENCOUNTER — Ambulatory Visit (INDEPENDENT_AMBULATORY_CARE_PROVIDER_SITE_OTHER)
Admission: RE | Admit: 2017-10-20 | Discharge: 2017-10-20 | Disposition: A | Discharge status: Home / Self Care | Payer: PPO | Source: Ambulatory Visit | Attending: Pulmonary Disease | Admitting: Pulmonary Disease

## 2017-10-20 ENCOUNTER — Encounter: Payer: Self-pay | Admitting: Pulmonary Disease

## 2017-10-20 VITALS — BP 108/70 | HR 52 | Ht 67.0 in | Wt 188.0 lb

## 2017-10-20 DIAGNOSIS — J441 Chronic obstructive pulmonary disease with (acute) exacerbation: Secondary | ICD-10-CM

## 2017-10-20 DIAGNOSIS — J449 Chronic obstructive pulmonary disease, unspecified: Secondary | ICD-10-CM | POA: Diagnosis not present

## 2017-10-20 MED ORDER — AZITHROMYCIN 250 MG PO TABS
ORAL_TABLET | ORAL | 0 refills | Status: DC
Start: 2017-10-20 — End: 2017-11-06

## 2017-10-20 MED ORDER — BUDESONIDE-FORMOTEROL FUMARATE 160-4.5 MCG/ACT IN AERO: 2.0000 | INHALATION_SPRAY | Freq: Two times a day (BID) | RESPIRATORY_TRACT | 6 refills | Status: DC

## 2017-10-20 MED ORDER — PREDNISONE 10 MG PO TABS: ORAL_TABLET | ORAL | 0 refills | Status: DC

## 2017-10-20 NOTE — Patient Instructions (Signed)
Chest xray today Zithromax 250 mg pill >> 2 pills on day 1, then 1 pill daily for next 4 days Prednisone 10 mg pill >> 3 pills daily for 2 days, 2 pills daily for 2 days, 1 pill daily for 2 days Symbicort 2 puffs twice per day and rinse mouth after each use  Follow up in 2 weeks with Dr. Halford Chessman or Nurse Practitioner

## 2017-10-20 NOTE — Progress Notes (Signed)
[facility]  Chief Complaint  Patient presents with  . Follow-up    Pt has prod cough-green, chest tightness, wheezing, and SOB increased in last month.     Vital signs: BP 108/70 (BP Location: Left Arm, Cuff Size: Normal)   Pulse (!) 52   Ht 5\' 7"  (1.702 m)   Wt 188 lb (85.3 kg)   SpO2 91%   BMI 29.44 kg/m   History of Present Illness: Ian [name] is a 82 y.o. male former smoker with chronic cough from COPD/asthma and rhinitis.  He was last seen in 2017.  He was treated for pneumonia in February 2019.  He was on ABx and prednisone.  Felt better, but now getting worse.  Has more cough with green sputum.  Getting wheeze and chest congestion.  Also feels congested in sinuses.  Feels hot, but hasn't check temperature.  Denies sore throat, swelling, or rashes.    Physical Exam:  General - pleasant Eyes - pupils reactive, wears glasses ENT - no sinus tenderness, no oral exudate, no LAN, septal deviation, clear nasal drainage Cardiac - regular, no murmur Chest - faint b/l wheeze with scattered rhonchi Abd - soft, non tender Ext - no edema Skin - no rashes Neuro - normal strength Psych - normal mood   Assessment/Plan:  COPD exacerbation with recent pneumonia. - will repeat CXR today - will give course of prednisone and zithromax  COPD with asthma and emphysema. - will add symbicort - continue singulair and prn nebulizer   Patient Instructions  Chest xray today Zithromax 250 mg pill >> 2 pills on day 1, then 1 pill daily for next 4 days Prednisone 10 mg pill >> 3 pills daily for 2 days, 2 pills daily for 2 days, 1 pill daily for 2 days Symbicort 2 puffs twice per day and rinse mouth after each use  Follow up in 2 weeks with Dr. Halford Chessman or Nurse Practitioner    Chesley Mires, MD [place] 10/20/2017, 12:31 PM  Flow Sheet  Pulmonary tests: PFT 11/29/11>>FEV1 1.71 (67%), FEV1% 64, TLC 5.29 (92%), DLCO 84%,  +BD  Chest imaging: CT chest 12/29/12 >> lingular PNA CT chest 12/06/15 >> mild centrilobular emphysema, nodularity in Lt upper and lower lobes CT chest 02/05/16 >> mild paraseptal emphysema, scar in lingula, much improved nodules in LUL   Echo 01/03/13 >> EF 60 to 65%, grade 1 DD  Past Medical History: He  has a past medical history of Allergy, Alzheimer's disease, Anemia, unspecified, Anxiety, Chronic maxillary sinusitis, COPD (chronic obstructive pulmonary disease) ([place]), Depression, Diabetes mellitus, Edema, Hypercalcemia, Hyperlipidemia, Hypertension, Hypertrophy of prostate without urinary obstruction and other lower urinary tract symptoms (LUTS), Hypertrophy of prostate without urinary obstruction and other lower urinary tract symptoms (LUTS), Kidney disease, Unspecified disorder of kidney and ureter, Unspecified sleep apnea, and Ventral hernia, unspecified, without mention of obstruction or gangrene.  Past Surgical History: He  has a past surgical history that includes Hernia repair and Exploratory laparotomy (2002).  Family History: His family history includes Heart disease in his father; Hypertension in his brother and sister; Pancreatic cancer in his sister.  Social History: He  reports that he quit smoking about 29 years ago. His smoking use included cigarettes. He has a 45.00 pack-year smoking history. He has never used smokeless tobacco. He reports that he does not drink alcohol or use drugs.  Medications: Allergies as of 10/20/2017      Reactions   Ace Inhibitors Cough  Medication List        Accurate as of 10/20/17 12:31 PM. Always use your most recent med list.          albuterol 108 (90 Base) MCG/ACT inhaler Commonly known as:  PROVENTIL HFA;VENTOLIN HFA Inhale 2 puffs into the lungs every 6 (six) hours as needed for wheezing or shortness of breath.   amLODipine 10 MG tablet Commonly known as:  NORVASC Take 1 tablet by mouth daily   aspirin EC 81 MG  tablet Take 1 tablet (81 mg total) by mouth daily.   atorvastatin 20 MG tablet Commonly known as:  LIPITOR Take one tablet by mouth once daily for cholesterol   azithromycin 250 MG tablet Commonly known as:  ZITHROMAX 2 pills on day one, then 1 pill daily for next 4 days.   budesonide-formoterol 160-4.5 MCG/ACT inhaler Commonly known as:  SYMBICORT Inhale 2 puffs into the lungs 2 (two) times daily.   gabapentin 100 MG capsule Commonly known as:  NEURONTIN Take 1 capsule (100 mg total) by mouth 3 (three) times daily.   glucose blood test strip Commonly known as:  ONE TOUCH ULTRA TEST Use as instructed to test blood sugar once daily dx E119   hydrALAZINE 25 MG tablet Commonly known as:  APRESOLINE Take 1 tablet (25 mg total) by mouth 2 (two) times daily.   ipratropium-albuterol 0.5-2.5 (3) MG/3ML Soln Commonly known as:  DUONEB Take 3 mLs by nebulization every 6 (six) hours as needed (wheezing).   linagliptin 5 MG Tabs tablet Commonly known as:  TRADJENTA TAKE 1 TABLET ONE TIME DAILY TO CONTROL BLOOD SUGAR   losartan 25 MG tablet Commonly known as:  COZAAR Take 25 mg by mouth daily.   metoprolol tartrate 50 MG tablet Commonly known as:  LOPRESSOR Take 1 tablet (50 mg total) by mouth 2 (two) times daily.   montelukast 10 MG tablet Commonly known as:  SINGULAIR Take 1 tablet (10 mg total) by mouth at bedtime.   moxifloxacin 400 MG tablet Commonly known as:  AVELOX Take 1 tablet (400 mg total) by mouth daily.   onetouch ultrasoft lancets Use to test blood sugar up to three times daily. Dx E11.9   PARoxetine 20 MG tablet Commonly known as:  PAXIL Take 1 tablet by mouth once daily for depression   polyethylene glycol powder powder Commonly known as:  GLYCOLAX/MIRALAX Take 17 g by mouth every 3 (three) days. For constipation; hold for loose stools   predniSONE 10 MG tablet Commonly known as:  DELTASONE 3 pills daily for 2 days, 2 pills daily for 2 days, 1 pill  daily for 2 days   traMADol 50 MG tablet Commonly known as:  ULTRAM Take 1 tablet (50 mg total) by mouth 2 (two) times daily.   traZODone 100 MG tablet Commonly known as:  DESYREL Take 0.5 tablets (50 mg total) by mouth at bedtime as needed. for sleep

## 2017-10-26 ENCOUNTER — Telehealth: Payer: Self-pay | Admitting: Pulmonary Disease

## 2017-10-26 NOTE — Telephone Encounter (Signed)
CLINICAL DATA:  COPD  EXAM: CHEST - 2 VIEW  COMPARISON:  10/01/17  FINDINGS: Cardiac shadow is within normal limits and stable. Aortic calcifications are seen. The lungs are well aerated bilaterally. No focal infiltrate or sizable effusion is seen. No acute bony abnormality is noted.  IMPRESSION: No acute abnormality noted.   Electronically Signed   By: Inez Catalina M.D.   On: 10/20/2017 13:51    Please let him know his chest xray was normal.

## 2017-10-27 NOTE — Telephone Encounter (Signed)
Attempted to call pt but no answer and no VM to leave a message for pt.  Will try to call pt back later.

## 2017-10-29 NOTE — Telephone Encounter (Signed)
lmtcb x1 for pt. 

## 2017-10-29 NOTE — Telephone Encounter (Signed)
Pt returned call Spoke with patient and informed him of cxr results as stated by VS Pt voiced his understanding Nothing further needed; will sign off

## 2017-10-30 ENCOUNTER — Other Ambulatory Visit: Payer: PPO

## 2017-10-30 DIAGNOSIS — I10 Essential (primary) hypertension: Secondary | ICD-10-CM | POA: Diagnosis not present

## 2017-10-30 DIAGNOSIS — E1159 Type 2 diabetes mellitus with other circulatory complications: Secondary | ICD-10-CM | POA: Diagnosis not present

## 2017-10-30 DIAGNOSIS — N183 Chronic kidney disease, stage 3 (moderate): Secondary | ICD-10-CM

## 2017-10-30 DIAGNOSIS — E0800 Diabetes mellitus due to underlying condition with hyperosmolarity without nonketotic hyperglycemic-hyperosmolar coma (NKHHC): Secondary | ICD-10-CM | POA: Diagnosis not present

## 2017-10-30 DIAGNOSIS — E7800 Pure hypercholesterolemia, unspecified: Secondary | ICD-10-CM | POA: Diagnosis not present

## 2017-10-30 DIAGNOSIS — N1831 Chronic kidney disease, stage 3a: Secondary | ICD-10-CM

## 2017-10-30 LAB — BASIC METABOLIC PANEL
BUN/Creatinine Ratio: 18 (calc) (ref 6–22)
BUN: 31 mg/dL — ABNORMAL HIGH (ref 7–25)
CO2: 30 mmol/L (ref 20–32)
Calcium: 9.7 mg/dL (ref 8.6–10.3)
Chloride: 106 mmol/L (ref 98–110)
Creat: 1.77 mg/dL — ABNORMAL HIGH (ref 0.70–1.18)
Glucose, Bld: 141 mg/dL — ABNORMAL HIGH (ref 65–99)
Potassium: 4.3 mmol/L (ref 3.5–5.3)
Sodium: 140 mmol/L (ref 135–146)

## 2017-10-30 LAB — LIPID PANEL
Cholesterol: 170 mg/dL (ref <200)
HDL: 48 mg/dL (ref >40)
LDL Cholesterol (Calc): 101 mg/dL (calc) — ABNORMAL HIGH
Non-HDL Cholesterol (Calc): 122 mg/dL (calc) (ref <130)
Total CHOL/HDL Ratio: 3.5 (calc) (ref <5.0)
Triglycerides: 118 mg/dL (ref <150)

## 2017-10-31 LAB — HEMOGLOBIN A1C
Hgb A1c MFr Bld: 6.8 % of total Hgb — ABNORMAL HIGH (ref <5.7)
Mean Plasma Glucose: 148 (calc)
eAG (mmol/L): 8.2 (calc)

## 2017-11-03 ENCOUNTER — Ambulatory Visit: Payer: PPO | Admitting: Internal Medicine

## 2017-11-06 ENCOUNTER — Encounter: Payer: Self-pay | Admitting: Acute Care

## 2017-11-06 ENCOUNTER — Ambulatory Visit: Payer: PPO | Admitting: Acute Care

## 2017-11-06 DIAGNOSIS — J449 Chronic obstructive pulmonary disease, unspecified: Secondary | ICD-10-CM

## 2017-11-06 DIAGNOSIS — R05 Cough: Secondary | ICD-10-CM | POA: Diagnosis not present

## 2017-11-06 DIAGNOSIS — R059 Cough, unspecified: Secondary | ICD-10-CM

## 2017-11-06 NOTE — Progress Notes (Signed)
History of Present Illness Ian [name] is a 82 y.o. male former smoker ( Quit 78 with a 45 pack year smoking history)  with COPD,asthma and emphysema.He is followed by Dr. Halford Chessman. Maintenance : Symbicort/ Singulair  11/06/2017  Follow up COPD exacerbation with recent pneumonia.Pt. Was seen by Dr. Halford Chessman 10/20/2017. He was treated with pred taper and Zithromax. He returns for follow up. He states he completed all medications.He states he is doing better. Almost back to baseline. He is using his cough syrup for cough.. He states his secretions are now lighter in color. He denies fever, chest pain, orthopnea or hemoptysis.Cough is better but not gone.He states he is compliant with his Symbicort and Singulair. He uses is rescue medication about once daily at present.He is taking Delsym for cough.He will occasionally take Delsym.   Test Results: CXR 10/20/2017>> CXR>> No acute abnormality noted. Echo 01/03/13 >> EF 60 to 65%, grade 1 DD  CBC Latest Ref Rng & Units 10/09/2017 10/01/2017 09/01/2017  WBC 3.8 - 10.8 Thousand/uL 10.4 16.8(H) 13.9(H)  Hemoglobin 13.2 - 17.1 g/dL 12.4(L) 13.0(L) 12.3(L)  Hematocrit 38.5 - 50.0 % 36.0(L) 37.5(L) 35.8(L)  Platelets 140 - 400 Thousand/uL 238 215 194    BMP Latest Ref Rng & Units 10/30/2017 10/09/2017 09/08/2017  Glucose 65 - 99 mg/dL 141(H) 162(H) 130  BUN 7 - 25 mg/dL 31(H) 29(H) 70(H)  Creatinine 0.70 - 1.18 mg/dL 1.77(H) 2.21(H) 2.37(H)  BUN/Creat Ratio 6 - 22 (calc) 18 13 30(H)  Sodium 135 - 146 mmol/L 140 141 141  Potassium 3.5 - 5.3 mmol/L 4.3 4.4 4.7  Chloride 98 - 110 mmol/L 106 108 109  CO2 20 - 32 mmol/L 30 29 25   Calcium 8.6 - 10.3 mg/dL 9.7 9.5 9.9    BNP No results found for: BNP  ProBNP    Component Value Date/Time   PROBNP 788.4 (H) 01/01/2013 0900    PFT No results found for: FEV1PRE, FEV1POST, FVCPRE, FVCPOST, TLC, DLCOUNC, PREFEV1FVCRT, PSTFEV1FVCRT  Dg Chest 2 View  Result Date: 10/20/2017 CLINICAL DATA:  COPD EXAM: CHEST - 2 VIEW  COMPARISON:  10/01/17 FINDINGS: Cardiac shadow is within normal limits and stable. Aortic calcifications are seen. The lungs are well aerated bilaterally. No focal infiltrate or sizable effusion is seen. No acute bony abnormality is noted. IMPRESSION: No acute abnormality noted. Electronically Signed   By: Inez Catalina M.D.   On: 10/20/2017 13:51     Past medical hx Past Medical History:  Diagnosis Date  . Allergy   . Alzheimer's disease   . Anemia, unspecified   . Anxiety   . Chronic maxillary sinusitis   . COPD (chronic obstructive pulmonary disease) ([place])   . Depression   . Diabetes mellitus   . Edema   . Hypercalcemia   . Hyperlipidemia   . Hypertension   . Hypertrophy of prostate without urinary obstruction and other lower urinary tract symptoms (LUTS)   . Hypertrophy of prostate without urinary obstruction and other lower urinary tract symptoms (LUTS)   . Kidney disease    mild  . Unspecified disorder of kidney and ureter   . Unspecified sleep apnea   . Ventral hernia, unspecified, without mention of obstruction or gangrene      Social History   Tobacco Use  . Smoking status: Former Smoker    Packs/day: 1.50    Years: 30.00    Pack years: 45.00    Types: Cigarettes    Last attempt to quit: 07/08/1988  Years since quitting: 29.3  . Smokeless tobacco: Never Used  Substance Use Topics  . Alcohol use: No  . Drug use: No    [name] reports that he quit smoking about 29 years ago. His smoking use included cigarettes. He has a 45.00 pack-year smoking history. He has never used smokeless tobacco. He reports that he does not drink alcohol or use drugs.  Tobacco Cessation: Former smoker quit 1990 with a 45 pack year smoking history Past surgical hx, Family hx, Social hx all reviewed.  Current Outpatient Medications on File Prior to Visit  Medication Sig  . albuterol (PROVENTIL HFA;VENTOLIN HFA) 108 (90 Base) MCG/ACT inhaler Inhale 2 puffs into the lungs every 6 (six)  hours as needed for wheezing or shortness of breath.  Marland Kitchen amLODipine (NORVASC) 10 MG tablet Take 1 tablet by mouth daily  . aspirin EC 81 MG tablet Take 1 tablet (81 mg total) by mouth daily.  Marland Kitchen atorvastatin (LIPITOR) 20 MG tablet Take one tablet by mouth once daily for cholesterol  . budesonide-formoterol (SYMBICORT) 160-4.5 MCG/ACT inhaler Inhale 2 puffs into the lungs 2 (two) times daily.  Marland Kitchen gabapentin (NEURONTIN) 100 MG capsule Take 1 capsule (100 mg total) by mouth 3 (three) times daily.  Marland Kitchen glucose blood (ONE TOUCH ULTRA TEST) test strip Use as instructed to test blood sugar once daily dx E119  . hydrALAZINE (APRESOLINE) 25 MG tablet Take 1 tablet (25 mg total) by mouth 2 (two) times daily.  Marland Kitchen ipratropium-albuterol (DUONEB) 0.5-2.5 (3) MG/3ML SOLN Take 3 mLs by nebulization every 6 (six) hours as needed (wheezing).  . Lancets (ONETOUCH ULTRASOFT) lancets Use to test blood sugar up to three times daily. Dx E11.9  . linagliptin (TRADJENTA) 5 MG TABS tablet TAKE 1 TABLET ONE TIME DAILY TO CONTROL BLOOD SUGAR  . losartan (COZAAR) 25 MG tablet Take 25 mg by mouth daily.  . metoprolol tartrate (LOPRESSOR) 50 MG tablet Take 1 tablet (50 mg total) by mouth 2 (two) times daily.  . montelukast (SINGULAIR) 10 MG tablet Take 1 tablet (10 mg total) by mouth at bedtime.  Marland Kitchen PARoxetine (PAXIL) 20 MG tablet Take 1 tablet by mouth once daily for depression  . polyethylene glycol powder (GLYCOLAX/MIRALAX) powder Take 17 g by mouth every 3 (three) days. For constipation; hold for loose stools  . traMADol (ULTRAM) 50 MG tablet Take 1 tablet (50 mg total) by mouth 2 (two) times daily.  . traZODone (DESYREL) 100 MG tablet Take 0.5 tablets (50 mg total) by mouth at bedtime as needed. for sleep   No current facility-administered medications on file prior to visit.      Allergies  Allergen Reactions  . Ace Inhibitors Cough    Review Of Systems:  Constitutional:   No  weight loss, night sweats,  Fevers,  chills,+  fatigue, or  lassitude.  HEENT:   No headaches,  Difficulty swallowing,  Tooth/dental problems, or  Sore throat,                No sneezing, itching, ear ache, +nasal congestion, post nasal drip,   CV:  No chest pain,  Orthopnea, PND, swelling in lower extremities, anasarca, dizziness, palpitations, syncope.   GI  No heartburn, indigestion, abdominal pain, nausea, vomiting, diarrhea, change in bowel habits, loss of appetite, bloody stools.   Resp: + baseline  shortness of breath with exertion less at rest.  + baseline  excess mucus, + productive cough,  No non-productive cough,  No coughing up of blood.  No change in color of mucus.  Occasional wheezing.  No chest wall deformity  Skin: no rash or lesions.  GU: no dysuria, change in color of urine, no urgency or frequency.  No flank pain, no hematuria   MS:  No joint pain or swelling.  No decreased range of motion.  No back pain.  Psych:  No change in mood or affect. No depression or anxiety.  No memory loss.   Vital Signs BP 130/68 (BP Location: Left Arm, Cuff Size: Normal)   Pulse (!) 52   Ht 5\' 7"  (1.702 m)   Wt 187 lb 12.8 oz (85.2 kg)   SpO2 93%   BMI 29.41 kg/m    Physical Exam:  General- No distress,  A&Ox3, Pleasant ENT: No sinus tenderness, TM clear, pale nasal mucosa, no oral exudate,no post nasal drip, no LAN Cardiac: S1, S2, regular rate and rhythm, no murmur Chest: Faint Exp  wheeze/ No rales/ dullness; no accessory muscle use, no nasal flaring, no sternal retractions Abd.: Soft Non-tender, ND, BS + Ext: No clubbing cyanosis, edema Neuro:  normal strength Skin: No rashes, warm and dry Psych: normal mood and behavior   Assessment/Plan  COPD with asthma (HCC) Resolving flare Treated with Azithro and Doxy Plan Continue your Symbicort 2 puffs twice  Daily Rinse mouth after use. Continue Singulair 10 mg daily as you have been doing. Mucinex 600 mg once daily. Take with a full glass of water. FENO  today Try protonix 20 mg once daily for reflux. Delsym cough syrup as you have been doing ( Every 12 hours) For cough, avoid mint, menthol, caffeine, and chocolate. Sips of water instead of throat clearing. Throat soothing with sugar free hard candy ( [employer] or [employer].) Note your daily symptoms > remember "red flags" for COPD:  Increase in cough, increase in sputum production, increase in shortness of breath or activity tolerance. If you notice these symptoms, please call to be seen.   Follow up with Dr. Chaya Jan NP in 3 months. Please contact office for sooner follow up if symptoms do not improve or worsen or seek emergency care    Cough Continued cough with flare Plan: Try protonix 20 mg once daily for reflux. Delsym cough syrup as you have been doing ( Every 12 hours) For cough, avoid mint, menthol, caffeine, and chocolate. Sips of water instead of throat clearing. Throat soothing with sugar free hard candy ( [employer] or [employer].) Please contact office for sooner follow up if symptoms do not improve or worsen or seek emergency care     Magdalen Spatz, NP 11/06/2017  3:35 PM

## 2017-11-06 NOTE — Assessment & Plan Note (Signed)
Continued cough with flare Plan: Try protonix 20 mg once daily for reflux. Delsym cough syrup as you have been doing ( Every 12 hours) For cough, avoid mint, menthol, caffeine, and chocolate. Sips of water instead of throat clearing. Throat soothing with sugar free hard candy ( [employer] or [employer].) Please contact office for sooner follow up if symptoms do not improve or worsen or seek emergency care

## 2017-11-06 NOTE — Patient Instructions (Addendum)
It is nice to see you today. Continue your Symbicort 2 puffs twice  Daily Rinse mouth after use. Continue Singulair 10 mg daily as you have been doing. Mucinex 600 mg once daily. Take with a full glass of water. FENO today Try protonix 20 mg once daily for reflux. Delsym cough syrup as you have been doing ( Every 12 hours) For cough, avoid mint, menthol, caffeine, and chocolate. Sips of water instead of throat clearing. Throat soothing with sugar free hard candy ( [employer] or [employer].) Note your daily symptoms > remember "red flags" for COPD:  Increase in cough, increase in sputum production, increase in shortness of breath or activity tolerance. If you notice these symptoms, please call to be seen.   Follow up with Dr. Chaya Jan NP in 3 months. Please contact office for sooner follow up if symptoms do not improve or worsen or seek emergency care

## 2017-11-06 NOTE — Assessment & Plan Note (Signed)
Resolving flare Treated with Azithro and Doxy Plan Continue your Symbicort 2 puffs twice  Daily Rinse mouth after use. Continue Singulair 10 mg daily as you have been doing. Mucinex 600 mg once daily. Take with a full glass of water. FENO today Try protonix 20 mg once daily for reflux. Delsym cough syrup as you have been doing ( Every 12 hours) For cough, avoid mint, menthol, caffeine, and chocolate. Sips of water instead of throat clearing. Throat soothing with sugar free hard candy ( [employer] or [employer].) Note your daily symptoms > remember "red flags" for COPD:  Increase in cough, increase in sputum production, increase in shortness of breath or activity tolerance. If you notice these symptoms, please call to be seen.   Follow up with Dr. Chaya Jan NP in 3 months. Please contact office for sooner follow up if symptoms do not improve or worsen or seek emergency care

## 2017-11-10 ENCOUNTER — Ambulatory Visit: Payer: PPO | Admitting: Internal Medicine

## 2017-11-24 ENCOUNTER — Other Ambulatory Visit: Payer: Self-pay | Admitting: Internal Medicine

## 2017-11-24 DIAGNOSIS — E1159 Type 2 diabetes mellitus with other circulatory complications: Secondary | ICD-10-CM

## 2017-11-24 DIAGNOSIS — E0800 Diabetes mellitus due to underlying condition with hyperosmolarity without nonketotic hyperglycemic-hyperosmolar coma (NKHHC): Secondary | ICD-10-CM

## 2017-11-24 DIAGNOSIS — I10 Essential (primary) hypertension: Secondary | ICD-10-CM

## 2017-11-25 MED ORDER — LOSARTAN POTASSIUM 25 MG PO TABS: 25.0000 mg | ORAL_TABLET | Freq: Every day | ORAL | 3 refills | Status: DC

## 2017-12-02 ENCOUNTER — Other Ambulatory Visit: Payer: Self-pay | Admitting: *Deleted

## 2017-12-02 DIAGNOSIS — F5101 Primary insomnia: Secondary | ICD-10-CM

## 2017-12-02 MED ORDER — TRAZODONE HCL 100 MG PO TABS
50.0000 mg | ORAL_TABLET | Freq: Every evening | ORAL | 3 refills | Status: DC | PRN
Start: 2017-12-02 — End: 2017-12-16

## 2017-12-02 NOTE — Telephone Encounter (Signed)
Patient requested. Faxed refill to pharmacy.

## 2017-12-15 ENCOUNTER — Telehealth: Payer: Self-pay | Admitting: *Deleted

## 2017-12-15 NOTE — Telephone Encounter (Signed)
I recommend that Ian [name] and his son come see me so we can discuss services.

## 2017-12-15 NOTE — Telephone Encounter (Signed)
Catalina Antigua, son called and stated that he had some concerns regarding patient. Stated that his wife is on Hospice and going to pass any day now. Son is concerned because patient is hard of hearing and takes a lot of medications. Son is wondering if there are any services his dad can be referred to to help with daily living. Stated that it is going to be hard on him when she passes. Please Advise.    I instructed son to have his dad come by the office to sign a DPR so we can have authorization to speak him. He agreed.

## 2017-12-16 ENCOUNTER — Ambulatory Visit (INDEPENDENT_AMBULATORY_CARE_PROVIDER_SITE_OTHER): Payer: PPO | Admitting: Nurse Practitioner

## 2017-12-16 ENCOUNTER — Other Ambulatory Visit: Payer: Self-pay

## 2017-12-16 ENCOUNTER — Encounter: Payer: Self-pay | Admitting: Nurse Practitioner

## 2017-12-16 VITALS — BP 124/78 | HR 70 | Temp 98.5°F | Ht 67.0 in | Wt 191.0 lb

## 2017-12-16 DIAGNOSIS — E1122 Type 2 diabetes mellitus with diabetic chronic kidney disease: Secondary | ICD-10-CM

## 2017-12-16 DIAGNOSIS — B029 Zoster without complications: Secondary | ICD-10-CM

## 2017-12-16 DIAGNOSIS — N1831 Chronic kidney disease, stage 3a: Secondary | ICD-10-CM

## 2017-12-16 DIAGNOSIS — N183 Chronic kidney disease, stage 3 (moderate): Secondary | ICD-10-CM

## 2017-12-16 DIAGNOSIS — Z8719 Personal history of other diseases of the digestive system: Secondary | ICD-10-CM

## 2017-12-16 DIAGNOSIS — J209 Acute bronchitis, unspecified: Secondary | ICD-10-CM

## 2017-12-16 DIAGNOSIS — J441 Chronic obstructive pulmonary disease with (acute) exacerbation: Secondary | ICD-10-CM

## 2017-12-16 DIAGNOSIS — F4321 Adjustment disorder with depressed mood: Secondary | ICD-10-CM | POA: Diagnosis not present

## 2017-12-16 DIAGNOSIS — K5904 Chronic idiopathic constipation: Secondary | ICD-10-CM

## 2017-12-16 MED ORDER — LOSARTAN POTASSIUM 25 MG PO TABS: 25.0000 mg | ORAL_TABLET | Freq: Every day | ORAL | 6 refills | Status: DC

## 2017-12-16 MED ORDER — TRAMADOL HCL 50 MG PO TABS: 50.0000 mg | ORAL_TABLET | Freq: Two times a day (BID) | ORAL | 0 refills | Status: DC

## 2017-12-16 MED ORDER — ONETOUCH ULTRASOFT LANCETS MISC: 12 refills | Status: DC

## 2017-12-16 MED ORDER — ASPIRIN EC 81 MG PO TBEC: 81.0000 mg | DELAYED_RELEASE_TABLET | Freq: Every day | ORAL | 6 refills | Status: DC

## 2017-12-16 MED ORDER — LINAGLIPTIN 5 MG PO TABS
ORAL_TABLET | ORAL | 6 refills | Status: DC
Start: 2017-12-16 — End: 2017-12-24

## 2017-12-16 MED ORDER — HYDRALAZINE HCL 25 MG PO TABS: 25.0000 mg | ORAL_TABLET | Freq: Two times a day (BID) | ORAL | 6 refills | Status: DC

## 2017-12-16 MED ORDER — POLYETHYLENE GLYCOL 3350 17 GM/SCOOP PO POWD: 17.0000 g | ORAL | 3 refills | Status: DC

## 2017-12-16 MED ORDER — IPRATROPIUM-ALBUTEROL 0.5-2.5 (3) MG/3ML IN SOLN: 3.0000 mL | Freq: Four times a day (QID) | RESPIRATORY_TRACT | 3 refills | Status: DC | PRN

## 2017-12-16 MED ORDER — ATORVASTATIN CALCIUM 20 MG PO TABS: ORAL_TABLET | ORAL | 6 refills | Status: DC

## 2017-12-16 MED ORDER — BUDESONIDE-FORMOTEROL FUMARATE 160-4.5 MCG/ACT IN AERO: 2.0000 | INHALATION_SPRAY | Freq: Two times a day (BID) | RESPIRATORY_TRACT | 6 refills | Status: DC

## 2017-12-16 MED ORDER — TRAZODONE HCL 50 MG PO TABS: 50.0000 mg | ORAL_TABLET | Freq: Every day | ORAL | 6 refills | Status: DC

## 2017-12-16 MED ORDER — GABAPENTIN 100 MG PO CAPS: 100.0000 mg | ORAL_CAPSULE | Freq: Three times a day (TID) | ORAL | 6 refills | Status: DC

## 2017-12-16 MED ORDER — METOPROLOL TARTRATE 50 MG PO TABS: 50.0000 mg | ORAL_TABLET | Freq: Two times a day (BID) | ORAL | 6 refills | Status: DC

## 2017-12-16 MED ORDER — PAROXETINE HCL 20 MG PO TABS: ORAL_TABLET | ORAL | 6 refills | Status: DC

## 2017-12-16 MED ORDER — AMLODIPINE BESYLATE 10 MG PO TABS: 10.0000 mg | ORAL_TABLET | Freq: Every day | ORAL | 6 refills | Status: DC

## 2017-12-16 MED ORDER — GLUCOSE BLOOD VI STRP: ORAL_STRIP | 11 refills | Status: DC

## 2017-12-16 MED ORDER — MONTELUKAST SODIUM 10 MG PO TABS: 10.0000 mg | ORAL_TABLET | Freq: Every day | ORAL | 6 refills | Status: DC

## 2017-12-16 MED ORDER — ALBUTEROL SULFATE HFA 108 (90 BASE) MCG/ACT IN AERS: 2.0000 | INHALATION_SPRAY | Freq: Four times a day (QID) | RESPIRATORY_TRACT | 3 refills | Status: DC | PRN

## 2017-12-16 NOTE — Telephone Encounter (Signed)
Patient's son [name] called and requested that I call [facility] to initiate blister pack medications for patient. I called [facility] at [phone] to verify patient's medications and to find out where to sent necessary Rx.   Prescriptions for all medications should be sent to [facility], Store number [phone]  Phone: [phone]  Fax: [phone].  Prescriptions were sent to pharmacy electronically. Tramadol prescription was called in to the pharmacy.

## 2017-12-16 NOTE — Telephone Encounter (Signed)
Spoke with son and advised results, pt is coming in today to see Janett Billow and will discuss with her and will schedule with Dr. Mariea Clonts if needed.

## 2017-12-16 NOTE — Telephone Encounter (Signed)
[facility] # [phone]  [address] at [place]  Ph: [phone] Fax: [phone]

## 2017-12-16 NOTE — Progress Notes (Signed)
Careteam: Patient Care Team: Gayland Curry, DO as PCP - General (Geriatric Medicine) Zebedee Iba., MD as Referring Physician (Ophthalmology) Chesley Mires, MD as Consulting Physician (Pulmonary Disease)  Advanced Directive information Does Patient Have a Medical Advance Directive?: Yes, Type of Advance Directive: Healthcare Power of Attorney  Allergies  Allergen Reactions  . Ace Inhibitors Cough    Chief Complaint  Patient presents with  . Acute Visit    Pt is being seen due to elevated blood sugars for last several days. Pt's wife passed away yesterday and he admits to eating sugary foods for last few days.   . Other    Pt son, Catalina Antigua, is in room. He would like a referral for social work and he would like to be contacted to set up any appointments.      HPI: Patient is a 82 y.o. male seen in the office today due to elevated blood sugars for a few days. Just started taking it a few days ago.  pts wife passed away yesterday. She had been in hospice.  cbg  Last night 219 Fasting this morning is 155- had sweat tea, fries and candy yesterday.  Taking tradjenta for diabetes.  Son would like more information of diabetes.  Trying to get back on track on diet.   Looking into pill packs to help with medication compliance- wife was pretty much in charge of everything.  Review of Systems:  Review of Systems  Constitutional: Negative for chills, fever and malaise/fatigue.  HENT: Positive for hearing loss.        Hearing aids  Eyes: Negative for blurred vision.       Glasses  Respiratory: Negative for cough and shortness of breath.   Cardiovascular: Negative for chest pain, palpitations and leg swelling.  Gastrointestinal: Positive for constipation. Negative for abdominal pain, blood in stool and melena.  Genitourinary: Negative for dysuria.  Musculoskeletal: Negative for falls.  Skin: Negative for itching and rash.  Neurological: Negative for dizziness, loss of consciousness  and weakness.  Endo/Heme/Allergies: Does not bruise/bleed easily.  Psychiatric/Behavioral: Positive for memory loss.    Past Medical History:  Diagnosis Date  . Allergy   . Alzheimer's disease   . Anemia, unspecified   . Anxiety   . Chronic maxillary sinusitis   . COPD (chronic obstructive pulmonary disease) ([place])   . Depression   . Diabetes mellitus   . Edema   . Hypercalcemia   . Hyperlipidemia   . Hypertension   . Hypertrophy of prostate without urinary obstruction and other lower urinary tract symptoms (LUTS)   . Hypertrophy of prostate without urinary obstruction and other lower urinary tract symptoms (LUTS)   . Kidney disease    mild  . Unspecified disorder of kidney and ureter   . Unspecified sleep apnea   . Ventral hernia, unspecified, without mention of obstruction or gangrene    Past Surgical History:  Procedure Laterality Date  . EXPLORATORY LAPAROTOMY  2002  . HERNIA REPAIR     Social History:   reports that he quit smoking about 29 years ago. His smoking use included cigarettes. He has a 45.00 pack-year smoking history. He has never used smokeless tobacco. He reports that he does not drink alcohol or use drugs.  Family History  Problem Relation Age of Onset  . Pancreatic cancer Sister   . Heart disease Father   . Hypertension Brother   . Hypertension Sister     Medications: Patient's Medications  New  Prescriptions   No medications on file  Previous Medications   ALBUTEROL (PROVENTIL HFA;VENTOLIN HFA) 108 (90 BASE) MCG/ACT INHALER    Inhale 2 puffs into the lungs every 6 (six) hours as needed for wheezing or shortness of breath.   AMLODIPINE (NORVASC) 10 MG TABLET    Take 1 tablet by mouth daily   ASPIRIN EC 81 MG TABLET    Take 1 tablet (81 mg total) by mouth daily.   ATORVASTATIN (LIPITOR) 20 MG TABLET    Take one tablet by mouth once daily for cholesterol   BUDESONIDE-FORMOTEROL (SYMBICORT) 160-4.5 MCG/ACT INHALER    Inhale 2 puffs into the lungs 2  (two) times daily.   GABAPENTIN (NEURONTIN) 100 MG CAPSULE    Take 1 capsule (100 mg total) by mouth 3 (three) times daily.   GLUCOSE BLOOD (ONE TOUCH ULTRA TEST) TEST STRIP    Use as instructed to test blood sugar once daily dx E119   HYDRALAZINE (APRESOLINE) 25 MG TABLET    Take 1 tablet (25 mg total) by mouth 2 (two) times daily.   IPRATROPIUM-ALBUTEROL (DUONEB) 0.5-2.5 (3) MG/3ML SOLN    Take 3 mLs by nebulization every 6 (six) hours as needed (wheezing).   LANCETS (ONETOUCH ULTRASOFT) LANCETS    Use to test blood sugar up to three times daily. Dx E11.9   LINAGLIPTIN (TRADJENTA) 5 MG TABS TABLET    TAKE 1 TABLET ONE TIME DAILY TO CONTROL BLOOD SUGAR   LOSARTAN (COZAAR) 25 MG TABLET    Take 1 tablet (25 mg total) by mouth daily.   METOPROLOL TARTRATE (LOPRESSOR) 50 MG TABLET    Take 1 tablet (50 mg total) by mouth 2 (two) times daily.   MONTELUKAST (SINGULAIR) 10 MG TABLET    Take 1 tablet (10 mg total) by mouth at bedtime.   PAROXETINE (PAXIL) 20 MG TABLET    Take 1 tablet by mouth once daily for depression   POLYETHYLENE GLYCOL POWDER (GLYCOLAX/MIRALAX) POWDER    Take 17 g by mouth every 3 (three) days. For constipation; hold for loose stools   TRAMADOL (ULTRAM) 50 MG TABLET    Take 1 tablet (50 mg total) by mouth 2 (two) times daily.   TRAZODONE (DESYREL) 100 MG TABLET    Take 0.5 tablets (50 mg total) by mouth at bedtime as needed. for sleep  Modified Medications   No medications on file  Discontinued Medications   No medications on file     Physical Exam:  Vitals:   12/16/17 0839  BP: 124/78  Pulse: 70  Temp: 98.5 F (36.9 C)  TempSrc: Oral  SpO2: 98%  Weight: 191 lb (86.6 kg)  Height: 5\' 7"  (1.702 m)   Body mass index is 29.91 kg/m.  Physical Exam  Constitutional: He is oriented to person, place, and time. He appears well-developed and well-nourished. No distress.  Cardiovascular: Normal rate, regular rhythm and normal heart sounds.  Pulmonary/Chest: Effort normal and  breath sounds normal. No respiratory distress.  Abdominal: Soft. Bowel sounds are normal.  Musculoskeletal: Normal range of motion.  Neurological: He is alert and oriented to person, place, and time. No cranial nerve deficit.  Skin: Skin is warm and dry.  Psychiatric: He has a normal mood and affect.    Labs reviewed: Basic Metabolic Panel: Recent Labs    01/02/17 0346  09/08/17 1634 10/09/17 1137 10/30/17 0859  NA 141   < > 141 141 140  K 3.8   < > 4.7 4.4 4.3  CL 111   < > 109 108 106  CO2 24   < > 25 29 30   GLUCOSE 97   < > 130 162* 141*  BUN 22*   < > 70* 29* 31*  CREATININE 1.55*   < > 2.37* 2.21* 1.77*  CALCIUM 9.3   < > 9.9 9.5 9.7  MG 1.9  --   --   --   --    < > = values in this interval not displayed.   Liver Function Tests: Recent Labs    12/30/16 1143 12/30/16 1807 01/01/17 0426  AST 15 17 12*  ALT 10 14* 10*  ALKPHOS 53 49 37*  BILITOT 0.5 0.4 0.6  PROT 6.5 6.9 5.1*  ALBUMIN 3.8 3.8 2.7*   Recent Labs    12/30/16 1143 12/30/16 1807  LIPASE 24 28  AMYLASE 46  --    No results for input(s): AMMONIA in the last 8760 hours. CBC: Recent Labs    09/01/17 1500 10/01/17 1400 10/09/17 1137  WBC 13.9* 16.8* 10.4  NEUTROABS 9,800* 12,550* 6,791  HGB 12.3* 13.0* 12.4*  HCT 35.8* 37.5* 36.0*  MCV 93.0 93.8 94.0  PLT 194 215 238   Lipid Panel: Recent Labs    06/06/17 0806 10/30/17 0859  CHOL 173 170  HDL 50 48  LDLCALC 102* 101*  TRIG 116 118  CHOLHDL 3.5 3.5   TSH: No results for input(s): TSH in the last 8760 hours. A1C: Lab Results  Component Value Date   HGBA1C 6.8 (H) 10/30/2017     Assessment/Plan 1. Type 2 diabetes mellitus with stage 3 chronic kidney disease, without long-term current use of insulin (HCC) -blood sugar mildly elevated, previous A1c in April 6.8. Diet education provided to son and pt. Due to increase in stress over the last few weeks due to wife being in hospice and her passing has not had great diet. Plans to  get back on track. Will continue to monitor blood sugars  - Ambulatory referral to [facility]  2. Grief Loss wife yesterday, son with pt today and wife was in charge of a lot in regards to pts medication and health.  - Ambulatory referral to [facility] to help with resources   Next appt: 4 weeks to follow up with Dr Sharee Holster K. [place], [facility] [phone]

## 2017-12-16 NOTE — Patient Instructions (Signed)
Diabetes Mellitus and Nutrition When you have diabetes (diabetes mellitus), it is very important to have healthy eating habits because your blood sugar (glucose) levels are greatly affected by what you eat and drink. Eating healthy foods in the appropriate amounts, at about the same times every day, can help you:  Control your blood glucose.  Lower your risk of heart disease.  Improve your blood pressure.  Reach or maintain a healthy weight.  Every person with diabetes is different, and each person has different needs for a meal plan. Your health care provider may recommend that you work with a diet and nutrition specialist (dietitian) to make a meal plan that is best for you. Your meal plan may vary depending on factors such as:  The calories you need.  The medicines you take.  Your weight.  Your blood glucose, blood pressure, and cholesterol levels.  Your activity level.  Other health conditions you have, such as heart or kidney disease.  How do carbohydrates affect me? Carbohydrates affect your blood glucose level more than any other type of food. Eating carbohydrates naturally increases the amount of glucose in your blood. Carbohydrate counting is a method for keeping track of how many carbohydrates you eat. Counting carbohydrates is important to keep your blood glucose at a healthy level, especially if you use insulin or take certain oral diabetes medicines. It is important to know how many carbohydrates you can safely have in each meal. This is different for every person. Your dietitian can help you calculate how many carbohydrates you should have at each meal and for snack. Foods that contain carbohydrates include:  Bread, cereal, rice, pasta, and crackers.  Potatoes and corn.  Peas, beans, and lentils.  Milk and yogurt.  Fruit and juice.  Desserts, such as cakes, cookies, ice cream, and candy.  How does alcohol affect me? Alcohol can cause a sudden decrease in blood  glucose (hypoglycemia), especially if you use insulin or take certain oral diabetes medicines. Hypoglycemia can be a life-threatening condition. Symptoms of hypoglycemia (sleepiness, dizziness, and confusion) are similar to symptoms of having too much alcohol. If your health care provider says that alcohol is safe for you, follow these guidelines:  Limit alcohol intake to no more than 1 drink per day for nonpregnant women and 2 drinks per day for men. One drink equals 12 oz of beer, 5 oz of wine, or 1 oz of hard liquor.  Do not drink on an empty stomach.  Keep yourself hydrated with water, diet soda, or unsweetened iced tea.  Keep in mind that regular soda, juice, and other mixers may contain a lot of sugar and must be counted as carbohydrates.  What are tips for following this plan? Reading food labels  Start by checking the serving size on the label. The amount of calories, carbohydrates, fats, and other nutrients listed on the label are based on one serving of the food. Many foods contain more than one serving per package.  Check the total grams (g) of carbohydrates in one serving. You can calculate the number of servings of carbohydrates in one serving by dividing the total carbohydrates by 15. For example, if a food has 30 g of total carbohydrates, it would be equal to 2 servings of carbohydrates.  Check the number of grams (g) of saturated and trans fats in one serving. Choose foods that have low or no amount of these fats.  Check the number of milligrams (mg) of sodium in one serving. Most people   should limit total sodium intake to less than 2,300 mg per day.  Always check the nutrition information of foods labeled as "low-fat" or "nonfat". These foods may be higher in added sugar or refined carbohydrates and should be avoided.  Talk to your dietitian to identify your daily goals for nutrients listed on the label. Shopping  Avoid buying canned, premade, or processed foods. These  foods tend to be high in fat, sodium, and added sugar.  Shop around the outside edge of the grocery store. This includes fresh fruits and vegetables, bulk grains, fresh meats, and fresh dairy. Cooking  Use low-heat cooking methods, such as baking, instead of high-heat cooking methods like deep frying.  Cook using healthy oils, such as olive, canola, or sunflower oil.  Avoid cooking with butter, cream, or high-fat meats. Meal planning  Eat meals and snacks regularly, preferably at the same times every day. Avoid going long periods of time without eating.  Eat foods high in fiber, such as fresh fruits, vegetables, beans, and whole grains. Talk to your dietitian about how many servings of carbohydrates you can eat at each meal.  Eat 4-6 ounces of lean protein each day, such as lean meat, chicken, fish, eggs, or tofu. 1 ounce is equal to 1 ounce of meat, chicken, or fish, 1 egg, or 1/4 cup of tofu.  Eat some foods each day that contain healthy fats, such as avocado, nuts, seeds, and fish. Lifestyle   Check your blood glucose regularly.  Exercise at least 30 minutes 5 or more days each week, or as told by your health care provider.  Take medicines as told by your health care provider.  Do not use any products that contain nicotine or tobacco, such as cigarettes and e-cigarettes. If you need help quitting, ask your health care provider.  Work with a counselor or diabetes educator to identify strategies to manage stress and any emotional and social challenges. What are some questions to ask my health care provider?  Do I need to meet with a diabetes educator?  Do I need to meet with a dietitian?  What number can I call if I have questions?  When are the best times to check my blood glucose? Where to find more information:  American Diabetes Association: diabetes.org/food-and-fitness/food  Academy of Nutrition and Dietetics:  www.eatright.org/resources/health/diseases-and-conditions/diabetes  National Institute of Diabetes and Digestive and Kidney Diseases (NIH): www.niddk.nih.gov/health-information/diabetes/overview/diet-eating-physical-activity Summary  A healthy meal plan will help you control your blood glucose and maintain a healthy lifestyle.  Working with a diet and nutrition specialist (dietitian) can help you make a meal plan that is best for you.  Keep in mind that carbohydrates and alcohol have immediate effects on your blood glucose levels. It is important to count carbohydrates and to use alcohol carefully. This information is not intended to replace advice given to you by your health care provider. Make sure you discuss any questions you have with your health care provider. Document Released: 03/21/2005 Document Revised: 07/29/2016 Document Reviewed: 07/29/2016 Elsevier Interactive Patient Education  2018 Elsevier Inc.  

## 2017-12-16 NOTE — Addendum Note (Signed)
Addended by: Denyse Amass on: 12/16/2017 11:39 AM   Modules accepted: Orders

## 2017-12-21 ENCOUNTER — Other Ambulatory Visit (INDEPENDENT_AMBULATORY_CARE_PROVIDER_SITE_OTHER): Payer: Self-pay | Admitting: Orthopaedic Surgery

## 2017-12-22 ENCOUNTER — Telehealth: Payer: Self-pay

## 2017-12-22 NOTE — Telephone Encounter (Signed)
    [facility] # [phone]  [address] at [place]  Ph: [phone] Fax: [phone]   Patient's son [name] called and requested that I call [facility] to [facility] blister pack medications for patient. [facility] called [facility] at [phone] to verify patient's medications and to find out where to sent necessary Rx.   Prescriptions for all medications should be sent to [facility], Store number [id number]  Phone: [phone]  Fax: [phone].  Prescriptions were sent to pharmacy electronically. Tramadol prescription was called in to the pharmacy.

## 2017-12-22 NOTE — Telephone Encounter (Signed)
He should use symbicort twice a day.   If he's not having pain, he does not need to take gabapentin or tramadol

## 2017-12-22 NOTE — Telephone Encounter (Signed)
Patient's son called to ask if patient needed to continue taking the gabapentin, tramadol and symbicort.   Patient has not been taking the gabapentin since the shingles pain subsided but he takes the tramadol twice daily. Patient has not been using symbicort twice daily as prescribed but uses it as needed.   Patient has been reporting that he is taking the medications at his appointments but his son says that he is easily confused about his medications.   Is it ok to remove the gabapentin from medication list and should patient continue taking the tramadol and symbicort?

## 2017-12-23 NOTE — Telephone Encounter (Signed)
Ok to renew?  

## 2017-12-23 NOTE — Telephone Encounter (Signed)
I spoke with patient's son, Catalina [place], to let him know that patient should stop taking the gabapentin and tramadol but continue the symbicort.   Matt verbalized understanding. I removed the gabapentin and tramadol from the medication list.   I called [facility] at [phone] to discontinue these medications. These medications will not be included in patient's first shipment of medication.   First shipment is scheduled for 12/26/17. Patient's son has been notified of this date.

## 2017-12-23 NOTE — Telephone Encounter (Signed)
Please advise 

## 2017-12-24 ENCOUNTER — Other Ambulatory Visit: Payer: Self-pay | Admitting: Internal Medicine

## 2017-12-24 NOTE — Telephone Encounter (Signed)
Rx sent to pharmacy   

## 2017-12-24 NOTE — Telephone Encounter (Signed)
A [facility] asking if it is ok to change tradjenta to [place] because patient's insurance will not cover tradjenta.    Is this change ok?

## 2017-12-24 NOTE — Telephone Encounter (Signed)
Yes, januvia 25mg  po daily for diabetes.  Change is ok.  I guess he's staying in [place] with his son now so that's why it came from there.

## 2017-12-24 NOTE — Telephone Encounter (Signed)
Ian [name] will call in

## 2017-12-31 ENCOUNTER — Ambulatory Visit (INDEPENDENT_AMBULATORY_CARE_PROVIDER_SITE_OTHER): Payer: PPO | Admitting: Acute Care

## 2017-12-31 ENCOUNTER — Encounter: Payer: Self-pay | Admitting: Acute Care

## 2017-12-31 VITALS — BP 122/60 | HR 53 | Ht 67.0 in | Wt 193.2 lb

## 2017-12-31 DIAGNOSIS — F329 Major depressive disorder, single episode, unspecified: Secondary | ICD-10-CM

## 2017-12-31 DIAGNOSIS — F32A Depression, unspecified: Secondary | ICD-10-CM

## 2017-12-31 DIAGNOSIS — J449 Chronic obstructive pulmonary disease, unspecified: Secondary | ICD-10-CM

## 2017-12-31 MED ORDER — BUDESONIDE-FORMOTEROL FUMARATE 160-4.5 MCG/ACT IN AERO: 2.0000 | INHALATION_SPRAY | Freq: Two times a day (BID) | RESPIRATORY_TRACT | 0 refills | Status: DC

## 2017-12-31 NOTE — Progress Notes (Signed)
History of Present Illness Ian [name] is a 82 y.o. male former smoker ( Quit 44 with a 45 pack year smoking history)  with COPD,asthma and emphysema.He is followed by Ian [name]. Maintenance : Symbicort/ Singulair  12/31/2017  Pt. Presents for an Acute OV to discuss his  inhalers. Pt. Was last seen 11/06/2017. At that time he was recovering from a COPD/ Asthma flare.He states he has been doing well. He has been compliant with his Symbicort twice daily and his Singulair daily.Marland Kitchen He states he has had some remote  shortness of breath . He states his wife died about 4 weeks ago. He has a lot of support from his family and church members . He states he has not been using his rescue inhaler. He has a rare cough. Secretions are clear and few. He denies fever, chest pain or orthopnea. He states he wanted to come in to make sure he was doing ok after his wife's death.    Test Results: CXR 11-18-17>> CXR>> No acute abnormality noted. Echo 01/03/13 >> EF 60 to 65%, grade 1 DD   CBC Latest Ref Rng & Units 10/09/2017 10/01/2017 09/01/2017  WBC 3.8 - 10.8 Thousand/uL 10.4 16.8(H) 13.9(H)  Hemoglobin 13.2 - 17.1 g/dL 12.4(L) 13.0(L) 12.3(L)  Hematocrit 38.5 - 50.0 % 36.0(L) 37.5(L) 35.8(L)  Platelets 140 - 400 Thousand/uL 238 215 194    BMP Latest Ref Rng & Units 10/30/2017 10/09/2017 09/08/2017  Glucose 65 - 99 mg/dL 141(H) 162(H) 130  BUN 7 - 25 mg/dL 31(H) 29(H) 70(H)  Creatinine 0.70 - 1.18 mg/dL 1.77(H) 2.21(H) 2.37(H)  BUN/Creat Ratio 6 - 22 (calc) 18 13 30(H)  Sodium 135 - 146 mmol/L 140 141 141  Potassium 3.5 - 5.3 mmol/L 4.3 4.4 4.7  Chloride 98 - 110 mmol/L 106 108 109  CO2 20 - 32 mmol/L 30 29 25   Calcium 8.6 - 10.3 mg/dL 9.7 9.5 9.9    BNP No results found for: BNP  ProBNP    Component Value Date/Time   PROBNP 788.4 (H) 01/01/2013 0900    PFT No results found for: FEV1PRE, FEV1POST, FVCPRE, FVCPOST, TLC, DLCOUNC, PREFEV1FVCRT, PSTFEV1FVCRT  No results found.   Past medical  hx Past Medical History:  Diagnosis Date  . Allergy   . Alzheimer's disease   . Anemia, unspecified   . Anxiety   . Chronic maxillary sinusitis   . COPD (chronic obstructive pulmonary disease) ([place])   . Depression   . Diabetes mellitus   . Edema   . Hypercalcemia   . Hyperlipidemia   . Hypertension   . Hypertrophy of prostate without urinary obstruction and other lower urinary tract symptoms (LUTS)   . Hypertrophy of prostate without urinary obstruction and other lower urinary tract symptoms (LUTS)   . Kidney disease    mild  . Unspecified disorder of kidney and ureter   . Unspecified sleep apnea   . Ventral hernia, unspecified, without mention of obstruction or gangrene      Social History   Tobacco Use  . Smoking status: Former Smoker    Packs/day: 1.50    Years: 30.00    Pack years: 45.00    Types: Cigarettes    Last attempt to quit: 07/08/1988    Years since quitting: 29.5  . Smokeless tobacco: Never Used  Substance Use Topics  . Alcohol use: No  . Drug use: No    IanMann reports that he quit smoking about 29 years ago. His smoking use included cigarettes.  He has a 45.00 pack-year smoking history. He has never used smokeless tobacco. He reports that he does not drink alcohol or use drugs.  Tobacco Cessation: Former smoker quit 1990 with a 45 pack year smoking history   Past surgical hx, Family hx, Social hx all reviewed.  Current Outpatient Medications on File Prior to Visit  Medication Sig  . albuterol (PROVENTIL HFA;VENTOLIN HFA) 108 (90 Base) MCG/ACT inhaler Inhale 2 puffs into the lungs every 6 (six) hours as needed for wheezing or shortness of breath.  Marland Kitchen amLODipine (NORVASC) 10 MG tablet Take 1 tablet (10 mg total) by mouth daily.  Marland Kitchen aspirin EC 81 MG tablet Take 1 tablet (81 mg total) by mouth daily.  Marland Kitchen atorvastatin (LIPITOR) 20 MG tablet Take one tablet by mouth once daily for cholesterol  . budesonide-formoterol (SYMBICORT) 160-4.5 MCG/ACT inhaler  Inhale 2 puffs into the lungs 2 (two) times daily.  Marland Kitchen glucose blood (ONE TOUCH ULTRA TEST) test strip Use as instructed to test blood sugar once daily dx E119  . hydrALAZINE (APRESOLINE) 25 MG tablet Take 1 tablet (25 mg total) by mouth 2 (two) times daily.  Marland Kitchen ipratropium-albuterol (DUONEB) 0.5-2.5 (3) MG/3ML SOLN Take 3 mLs by nebulization every 6 (six) hours as needed (wheezing).  . Lancets (ONETOUCH ULTRASOFT) lancets Use to test blood sugar up to three times daily. Dx E11.9  . losartan (COZAAR) 25 MG tablet Take 1 tablet (25 mg total) by mouth daily.  . metoprolol tartrate (LOPRESSOR) 50 MG tablet Take 1 tablet (50 mg total) by mouth 2 (two) times daily.  . montelukast (SINGULAIR) 10 MG tablet Take 1 tablet (10 mg total) by mouth at bedtime.  Marland Kitchen PARoxetine (PAXIL) 20 MG tablet Take 1 tablet by mouth once daily for depression  . polyethylene glycol powder (GLYCOLAX/MIRALAX) powder Take 17 g by mouth every 3 (three) days. For constipation; hold for loose stools  . sitaGLIPtin (JANUVIA) 25 MG tablet Take 1 tablet (25 mg total) by mouth daily.  . traMADol (ULTRAM) 50 MG tablet TAKE 1 TABLET BY MOUTH TWICE A DAY  . traZODone (DESYREL) 50 MG tablet Take 1 tablet (50 mg total) by mouth at bedtime.   No current facility-administered medications on file prior to visit.      Allergies  Allergen Reactions  . Ace Inhibitors Cough    Review Of Systems:  Constitutional:   No  weight loss, night sweats,  Fevers, chills, fatigue, or  lassitude.  HEENT:   No headaches,  Difficulty swallowing,  Tooth/dental problems, or  Sore throat,                No sneezing, itching, ear ache, nasal congestion, post nasal drip,   CV:  No chest pain,  Orthopnea, PND, swelling in lower extremities, anasarca, dizziness, palpitations, syncope.   GI  No heartburn, indigestion, abdominal pain, nausea, vomiting, diarrhea, change in bowel habits, loss of appetite, bloody stools.   Resp: + shortness of breath with  exertion or at rest.  No excess mucus, no productive cough,  No non-productive cough,  No coughing up of blood.  No change in color of mucus.  No wheezing.  No chest wall deformity  Skin: no rash or lesions.  GU: no dysuria, change in color of urine, no urgency or frequency.  No flank pain, no hematuria   MS:  No joint pain or swelling.  No decreased range of motion.  No back pain.  Psych:  No change in mood or affect. No  depression or anxiety.  No memory loss.   Vital Signs BP 122/60 (BP Location: Left Arm, Cuff Size: Normal)   Pulse (!) 53   Ht 5\' 7"  (1.702 m)   Wt 193 lb 3.2 oz (87.6 kg)   SpO2 93%   BMI 30.26 kg/m    Physical Exam:  General- No distress,  A&Ox3, pleasant ENT: No sinus tenderness, TM clear, pale nasal mucosa, no oral exudate,no post nasal drip, no LAN Cardiac: S1, S2, regular rate and rhythm, no murmur Chest: No wheeze/ rales/ dullness; no accessory muscle use, no nasal flaring, no sternal retractions, diminished per bases Abd.: Soft Non-tender, ND, BS +, Body mass index is 30.26 kg/m. Ext: No clubbing cyanosis, edema Neuro:  normal strength, MAE x 4, A&O x 3 Skin: No rashes, warm and dry Psych: normal mood and behavior   Assessment/Plan  COPD with asthma (HCC) At baseline since wife's death 4 weeks ago. Has had some remote dyspnea that slf resolves with rest He has not been using his rescue inhaler Compliant with Symbicort and Singulair No PFT's since 2013 Plan: We will schedule you for PFT's. We will call you with results. Continue Symbicort 2 puffs twice daily. Mucinex 600 mg once daily with a full glass of water. Rinse mouth after use. Continue your Singulair 10 mg every day. Remember to use your rescue inhaler 2 puffs up to every 6 hours as needed for shortness of breath or wheezing. Follow up with Ian [name] in 6 months. Please contact office for sooner follow up if symptoms do not improve or worsen or seek emergency care     Depression Wife died 4 weeks ago States he is at peace that his wife is no longer suffering Plan: Encouraged to follow up with PCP Reminded patient to seek help if he feels his depression is getting worse with grief Emotional support provided.    Magdalen Spatz, NP 12/31/2017  6:25 PM

## 2017-12-31 NOTE — Assessment & Plan Note (Signed)
At baseline since wife's death 4 weeks ago. Has had some remote dyspnea that slf resolves with rest He has not been using his rescue inhaler Compliant with Symbicort and Singulair No PFT's since 2013 Plan: We will schedule you for PFT's. We will call you with results. Continue Symbicort 2 puffs twice daily. Mucinex 600 mg once daily with a full glass of water. Rinse mouth after use. Continue your Singulair 10 mg every day. Remember to use your rescue inhaler 2 puffs up to every 6 hours as needed for shortness of breath or wheezing. Follow up with Dr. Halford Chessman in 6 months. Please contact office for sooner follow up if symptoms do not improve or worsen or seek emergency care

## 2017-12-31 NOTE — Patient Instructions (Addendum)
It is nice to see you today. We will schedule you for PFT's. We will call you with results. Continue Symbicort 2 puffs twice daily. Mucinex 600 mg once daily with a full glass of water. Rinse mouth after use. Continue your Singulair 10 mg every day. Remember to use your rescue inhaler 2 puffs up to every 6 hours as needed for shortness of breath or wheezing. Follow up with Dr. Halford Chessman in 6 months. Please contact office for sooner follow up if symptoms do not improve or worsen or seek emergency care

## 2017-12-31 NOTE — Assessment & Plan Note (Addendum)
Wife died 4 weeks ago States he is at peace that his wife is no longer suffering Plan: Encouraged to follow up with PCP Reminded patient to seek help if he feels his depression is getting worse with grief Emotional support provided.

## 2018-01-01 ENCOUNTER — Telehealth: Payer: Self-pay | Admitting: *Deleted

## 2018-01-01 ENCOUNTER — Ambulatory Visit (INDEPENDENT_AMBULATORY_CARE_PROVIDER_SITE_OTHER): Payer: PPO | Admitting: Pulmonary Disease

## 2018-01-01 DIAGNOSIS — E1122 Type 2 diabetes mellitus with diabetic chronic kidney disease: Secondary | ICD-10-CM

## 2018-01-01 DIAGNOSIS — J449 Chronic obstructive pulmonary disease, unspecified: Secondary | ICD-10-CM

## 2018-01-01 DIAGNOSIS — R2681 Unsteadiness on feet: Secondary | ICD-10-CM

## 2018-01-01 DIAGNOSIS — N183 Chronic kidney disease, stage 3 unspecified: Secondary | ICD-10-CM

## 2018-01-01 LAB — PULMONARY FUNCTION TEST
DL/VA % pred: 92 %
DL/VA: 4.07 ml/min/mmHg/L
DLCO UNC % PRED: 60 %
DLCO UNC: 17.14 ml/min/mmHg
FEF 25-75 PRE: 0.7 L/s
FEF2575-%Pred-Pre: 41 %
FEV1-%Pred-Pre: 54 %
FEV1-Pre: 1.34 L
FEV1FVC-%Pred-Pre: 87 %
FEV6-%PRED-PRE: 64 %
FEV6-Pre: 2.13 L
FEV6FVC-%PRED-PRE: 107 %
FVC-%PRED-PRE: 60 %
FVC-Pre: 2.13 L
PRE FEV1/FVC RATIO: 63 %
PRE FEV6/FVC RATIO: 100 %

## 2018-01-01 NOTE — Telephone Encounter (Signed)
[name], son called and left message on clinical intake and stated that he wanted to speak with Coralyn Mark. Stated that his mother passed away and he was wondering if Dr. Mariea Clonts or Janett Billow could write an order for patient to go to someone that would help him with Nutrition/diet and an order for some kind of therapy for strengthening. Stated that he feels like patient is getting febrile. Please Advise.

## 2018-01-01 NOTE — Telephone Encounter (Signed)
With Ian [name] memory loss, I'm not sure that a nutrition consult that he goes to unaccompanied will be very helpful.   In terms of PT, if he is homebound, we can order home health PT for him.  If he's continuing to go out to the gym, we could arrange for outpatient therapy. Please check with [name] about that and let me know so we can place the best order.

## 2018-01-01 NOTE — Progress Notes (Signed)
PFT scheduled. Pre spiro obtained, dlco obtained. Patient was unable to complete pleth due to not understanding instructions. Patient did not want to wait the 15 minute mark for post spiro patient stated that he needed to leave.

## 2018-01-02 NOTE — Telephone Encounter (Signed)
.  left message to have patient return my call.  

## 2018-01-05 ENCOUNTER — Other Ambulatory Visit: Payer: Self-pay | Admitting: *Deleted

## 2018-01-05 ENCOUNTER — Telehealth: Payer: Self-pay | Admitting: Acute Care

## 2018-01-05 DIAGNOSIS — J449 Chronic obstructive pulmonary disease, unspecified: Secondary | ICD-10-CM

## 2018-01-05 MED ORDER — GLUCOSE BLOOD VI STRP: ORAL_STRIP | 1 refills | Status: DC

## 2018-01-05 NOTE — Telephone Encounter (Signed)
.  left message to have patient return my call.  

## 2018-01-05 NOTE — Telephone Encounter (Signed)
Spoke with son and he will go with patient to nutrition referral, he would also like outpatient referral for PT.

## 2018-01-05 NOTE — Telephone Encounter (Signed)
I called pt to discuss results. Pt did not answer and I was unable to leave message. Wll try again later.

## 2018-01-05 NOTE — Telephone Encounter (Signed)
Please call patient and let him know there has been a slight progression in his COPD since 2013 when we last did PFT's, based on the limited PFT's he did  in the office. Let him know the medications he is currently on should help. Please let him know that Pulmonary rehab will also help.Let him know this is exercise that helps strengthen the muscles of respiration so breathing function improves.  See if he is interested .  If he is,  please place the referral. Use dyspnea as the reason, COPD is stage 2. Place under Dr. Halford Chessman.  Thanks so much.

## 2018-01-05 NOTE — Telephone Encounter (Signed)
Nutrition consult and outpatient PT orders entered.

## 2018-01-07 NOTE — Telephone Encounter (Signed)
Advised pt of results. Pt understood and nothing further is needed.   He agreed to order pulmonary rehab. I placed the order and advised him that they would call him to schedule.

## 2018-01-14 ENCOUNTER — Ambulatory Visit: Payer: PPO | Admitting: Internal Medicine

## 2018-01-16 ENCOUNTER — Telehealth: Payer: Self-pay | Admitting: Pulmonary Disease

## 2018-01-16 DIAGNOSIS — J432 Centrilobular emphysema: Secondary | ICD-10-CM

## 2018-01-16 NOTE — Telephone Encounter (Signed)
Called and spoke with Jarrett Soho from pulmonary rehab, she states that she is needing a new diagnosis for the patient since he did not complete the post bronch portion of the PFT. COPD cannot be used.   VS please advise, thank you.

## 2018-01-19 NOTE — Telephone Encounter (Signed)
New order has been placed with the new diagnosis. Called and spoke with Jarrett Soho letting her know this was being done.  Jarrett Soho expressed understanding. Nothing further needed.

## 2018-01-19 NOTE — Telephone Encounter (Signed)
Can use emphysema of lung J43.9.

## 2018-01-22 ENCOUNTER — Telehealth (HOSPITAL_COMMUNITY): Payer: Self-pay

## 2018-01-22 ENCOUNTER — Ambulatory Visit (INDEPENDENT_AMBULATORY_CARE_PROVIDER_SITE_OTHER): Payer: PPO | Admitting: Internal Medicine

## 2018-01-22 ENCOUNTER — Encounter: Payer: Self-pay | Admitting: Internal Medicine

## 2018-01-22 VITALS — BP 140/70 | HR 62 | Temp 98.6°F | Ht 67.0 in | Wt 196.0 lb

## 2018-01-22 DIAGNOSIS — I152 Hypertension secondary to endocrine disorders: Secondary | ICD-10-CM

## 2018-01-22 DIAGNOSIS — E1122 Type 2 diabetes mellitus with diabetic chronic kidney disease: Secondary | ICD-10-CM | POA: Diagnosis not present

## 2018-01-22 DIAGNOSIS — E7800 Pure hypercholesterolemia, unspecified: Secondary | ICD-10-CM

## 2018-01-22 DIAGNOSIS — N183 Chronic kidney disease, stage 3 unspecified: Secondary | ICD-10-CM

## 2018-01-22 DIAGNOSIS — E1159 Type 2 diabetes mellitus with other circulatory complications: Secondary | ICD-10-CM | POA: Diagnosis not present

## 2018-01-22 DIAGNOSIS — F4321 Adjustment disorder with depressed mood: Secondary | ICD-10-CM

## 2018-01-22 DIAGNOSIS — N1831 Chronic kidney disease, stage 3a: Secondary | ICD-10-CM

## 2018-01-22 DIAGNOSIS — I10 Essential (primary) hypertension: Secondary | ICD-10-CM | POA: Diagnosis not present

## 2018-01-22 NOTE — Telephone Encounter (Signed)
Pt insurance is active and benefits verified through [employer]. Co-pay $15.00, DED $0.00/$0.00 met, out of pocket $3,400.00/$355.00 met, co-insurance 0%. No pre-authorization required. [employer]/Melissa, 01/22/18 @ 9:13AM, [id number]

## 2018-01-22 NOTE — Progress Notes (Signed)
Location:  [facility] clinic Provider:  Vantasia Pinkney L. Mariea Clonts, D.O., C.M.D.  Code Status: DNR Goals of Care:  Advanced Directives 12/16/2017  Does Patient Have a Medical Advance Directive? Yes  Type of Advance Directive [place]  Does patient want to make changes to medical advance directive? -  Copy of [place] in Chart? Yes  Pre-existing out of facility DNR order (yellow form or pink MOST form) -   Chief Complaint  Patient presents with  . Medical Management of Chronic Issues    65mth follow-up    HPI: Patient is a 82 y.o. male seen today for medical management of chronic diseases.    It's been about 7 weeks since his wife passed.  He's getting better gradually.  Has good support from his children and the church.  He's staying at home.  He says he is resting pretty well.    He's set up with pillpack.    He does get up 2-3 times at night to urinate.  Discussed prostate enlargement.   His shoulders were hurting.  Dr. Durward Fortes gave him some tramadol. He's not needing that.  Will occasionally take tylenol.  He also used the tramadol for the shingles pain.  He took the antibiotics and that got better.    DMII:  Says he's trying to eat better.  The boys have been on him.    We had referred him for nutrition and PT.  It sounds like he may have declined these when he got a call about them after his son requested them.  He had gained 3-4 lbs.    He's eating more veggies and grilled chicken, occasional fish.    Breathing good.   BP elevated at first when he arrived.    Past Medical History:  Diagnosis Date  . Allergy   . Alzheimer's disease   . Anemia, unspecified   . Anxiety   . Chronic maxillary sinusitis   . COPD (chronic obstructive pulmonary disease) ([place])   . Depression   . Diabetes mellitus   . Edema   . Hypercalcemia   . Hyperlipidemia   . Hypertension   . Hypertrophy of prostate without urinary obstruction and other lower urinary  tract symptoms (LUTS)   . Hypertrophy of prostate without urinary obstruction and other lower urinary tract symptoms (LUTS)   . Kidney disease    mild  . Unspecified disorder of kidney and ureter   . Unspecified sleep apnea   . Ventral hernia, unspecified, without mention of obstruction or gangrene     Past Surgical History:  Procedure Laterality Date  . EXPLORATORY LAPAROTOMY  2002  . HERNIA REPAIR      Allergies  Allergen Reactions  . Ace Inhibitors Cough    Outpatient Encounter Medications as of 01/22/2018  Medication Sig  . albuterol (PROVENTIL HFA;VENTOLIN HFA) 108 (90 Base) MCG/ACT inhaler Inhale 2 puffs into the lungs every 6 (six) hours as needed for wheezing or shortness of breath.  Marland Kitchen amLODipine (NORVASC) 10 MG tablet Take 1 tablet (10 mg total) by mouth daily.  Marland Kitchen aspirin EC 81 MG tablet Take 1 tablet (81 mg total) by mouth daily.  Marland Kitchen atorvastatin (LIPITOR) 20 MG tablet Take one tablet by mouth once daily for cholesterol  . budesonide-formoterol (SYMBICORT) 160-4.5 MCG/ACT inhaler Inhale 2 puffs into the lungs 2 (two) times daily.  Marland Kitchen glucose blood (ONE TOUCH ULTRA TEST) test strip Use as instructed to test blood sugar once daily dx E119  .  hydrALAZINE (APRESOLINE) 25 MG tablet Take 1 tablet (25 mg total) by mouth 2 (two) times daily.  Marland Kitchen ipratropium-albuterol (DUONEB) 0.5-2.5 (3) MG/3ML SOLN Take 3 mLs by nebulization every 6 (six) hours as needed (wheezing).  . Lancets (ONETOUCH ULTRASOFT) lancets Use to test blood sugar up to three times daily. Dx E11.9  . losartan (COZAAR) 25 MG tablet Take 1 tablet (25 mg total) by mouth daily.  . metoprolol tartrate (LOPRESSOR) 50 MG tablet Take 1 tablet (50 mg total) by mouth 2 (two) times daily.  . montelukast (SINGULAIR) 10 MG tablet Take 1 tablet (10 mg total) by mouth at bedtime.  Marland Kitchen PARoxetine (PAXIL) 20 MG tablet Take 1 tablet by mouth once daily for depression  . polyethylene glycol powder (GLYCOLAX/MIRALAX) powder Take 17 g by  mouth every 3 (three) days. For constipation; hold for loose stools  . sitaGLIPtin (JANUVIA) 25 MG tablet Take 1 tablet (25 mg total) by mouth daily.  . traMADol (ULTRAM) 50 MG tablet TAKE 1 TABLET BY MOUTH TWICE A DAY  . traZODone (DESYREL) 50 MG tablet Take 1 tablet (50 mg total) by mouth at bedtime.   No facility-administered encounter medications on file as of 01/22/2018.     Review of Systems:  Review of Systems  Constitutional: Negative for chills, fever and malaise/fatigue.  HENT: Positive for hearing loss. Negative for congestion.   Eyes:       Glasses, uses drops as directed  Respiratory: Negative for cough and shortness of breath.   Cardiovascular: Negative for chest pain, palpitations and leg swelling.  Gastrointestinal: Negative for abdominal pain.  Genitourinary:       Nocturia 2-3x  Musculoskeletal: Negative for falls and joint pain.       Right TMJ pain off and on  Skin: Negative for itching and rash.  Neurological: Negative for dizziness.  Endo/Heme/Allergies: Does not bruise/bleed easily.  Psychiatric/Behavioral: Positive for memory loss. Negative for depression. The patient is not nervous/anxious and does not have insomnia.        Grief gradually improving    Health Maintenance  Topic Date Due  . OPHTHALMOLOGY EXAM  08/27/2015  . INFLUENZA VACCINE  02/05/2018  . HEMOGLOBIN A1C  05/01/2018  . FOOT EXAM  07/03/2018  . TETANUS/TDAP  08/11/2021  . PNA vac Low Risk Adult  Completed    Physical Exam: Vitals:   01/22/18 1142  BP: 140/70  Pulse: 62  Temp: 98.6 F (37 C)  TempSrc: Oral  SpO2: 94%  Weight: 196 lb (88.9 kg)  Height: 5\' 7"  (1.702 m)   Body mass index is 30.7 kg/m. Physical Exam  Constitutional: He is oriented to person, place, and time. He appears well-developed and well-nourished. No distress.  Cardiovascular: Normal rate, regular rhythm, normal heart sounds and intact distal pulses.  Pulmonary/Chest: Effort normal and breath sounds  normal. No respiratory distress.  Abdominal: Soft. Bowel sounds are normal.  Musculoskeletal: Normal range of motion.  Neurological: He is alert and oriented to person, place, and time.  Skin: Skin is warm and dry. Capillary refill takes less than 2 seconds.  Psychiatric: He has a normal mood and affect.    Labs reviewed: Basic Metabolic Panel: Recent Labs    09/08/17 1634 10/09/17 1137 10/30/17 0859  NA 141 141 140  K 4.7 4.4 4.3  CL 109 108 106  CO2 25 29 30   GLUCOSE 130 162* 141*  BUN 70* 29* 31*  CREATININE 2.37* 2.21* 1.77*  CALCIUM 9.9 9.5 9.7   Liver  Function Tests: No results for input(s): AST, ALT, ALKPHOS, BILITOT, PROT, ALBUMIN in the last 8760 hours. No results for input(s): LIPASE, AMYLASE in the last 8760 hours. No results for input(s): AMMONIA in the last 8760 hours. CBC: Recent Labs    09/01/17 1500 10/01/17 1400 10/09/17 1137  WBC 13.9* 16.8* 10.4  NEUTROABS 9,800* 12,550* 6,791  HGB 12.3* 13.0* 12.4*  HCT 35.8* 37.5* 36.0*  MCV 93.0 93.8 94.0  PLT 194 215 238   Lipid Panel: Recent Labs    06/06/17 0806 10/30/17 0859  CHOL 173 170  HDL 50 48  LDLCALC 102* 101*  TRIG 116 118  CHOLHDL 3.5 3.5   Lab Results  Component Value Date   HGBA1C 6.8 (H) 10/30/2017    Assessment/Plan 1. Type 2 diabetes mellitus with stage 3 chronic kidney disease, without long-term current use of insulin (HCC) - cont current therapy, nutrition consult pending--pt and son to go together so he can reinforce what's said - CBC with Differential/Platelet; Future - COMPLETE METABOLIC PANEL WITH GFR; Future - Hemoglobin A1c; Future  2. Grief -gradually improving per pt after loss of his wife  3. Chronic kidney disease (CKD) stage G3a/A2, moderately decreased glomerular filtration rate (GFR) between 45-59 mL/min/1.73 square meter and albuminuria creatinine ratio between 30-299 mg/g (HCC) -has been stable, Avoid nephrotoxic agents like nsaids, dose adjust renally  excreted meds, hydrate - CBC with Differential/Platelet; Future - COMPLETE METABOLIC PANEL WITH GFR; Future  4. Pure hypercholesterolemia -cont same regimen and monitor - Lipid panel; Future  5. Hypertension associated with diabetes ([place]) -bp elevated upon arrival, monitor, cont same meds at this point - COMPLETE METABOLIC PANEL WITH GFR; Future  Labs/tests ordered:   Orders Placed This Encounter  Procedures  . CBC with Differential/Platelet    Standing Status:   Future    Standing Expiration Date:   02/04/2018  . COMPLETE METABOLIC PANEL WITH GFR    Standing Status:   Future    Standing Expiration Date:   02/04/2018  . Hemoglobin A1c    Standing Status:   Future    Standing Expiration Date:   02/04/2018  . Lipid panel    Standing Status:   Future    Standing Expiration Date:   02/04/2018  to get next Thursday or other day after that but before august  Next appt:  07/06/2018; 05/21/2018 for med mgt, will check labs same day that time   Meshelle Holness L. Aryah Doering, D.O. [facility] [address], [place] [address] Cell Phone (Mon-Fri 8am-5pm):  [phone] On Call:  [phone] & follow prompts after 5pm & weekends Office Phone:  [phone] Office Fax:  [phone]

## 2018-01-22 NOTE — Telephone Encounter (Signed)
Called patient to see if he was interested in participating in the Pulmonary Rehab Program. He stated not at this time, dealing with a lot with his wife passing 7 weeks ago.  Closed referral

## 2018-01-27 DIAGNOSIS — N2581 Secondary hyperparathyroidism of renal origin: Secondary | ICD-10-CM | POA: Diagnosis not present

## 2018-01-27 DIAGNOSIS — D631 Anemia in chronic kidney disease: Secondary | ICD-10-CM | POA: Diagnosis not present

## 2018-01-27 DIAGNOSIS — N183 Chronic kidney disease, stage 3 (moderate): Secondary | ICD-10-CM | POA: Diagnosis not present

## 2018-01-27 DIAGNOSIS — N189 Chronic kidney disease, unspecified: Secondary | ICD-10-CM | POA: Diagnosis not present

## 2018-01-27 DIAGNOSIS — I129 Hypertensive chronic kidney disease with stage 1 through stage 4 chronic kidney disease, or unspecified chronic kidney disease: Secondary | ICD-10-CM | POA: Diagnosis not present

## 2018-01-28 ENCOUNTER — Telehealth: Payer: Self-pay | Admitting: *Deleted

## 2018-01-28 NOTE — Telephone Encounter (Signed)
We think he should still have some of the samples [facility] gave him last week.   We probably should check with his son.  Pt's memory is poor.

## 2018-01-28 NOTE — Telephone Encounter (Signed)
I'm concerned that if we give him tradjenta samples instead of januvia (which [facility] just gave him samples of when he was here to see me), then he will get more confused about what he should take and potentially take both.

## 2018-01-28 NOTE — Telephone Encounter (Signed)
So should he just not take anything and just wait until his Januvia comes in. Please Advise.

## 2018-01-28 NOTE — Telephone Encounter (Signed)
Patient stopped by the office today requesting a sample of Tradjenta. Patient was switched to Januvia due to expense on 12/24/17. Patient has been taking the Januvia but has ran out and waiting for Pill Pack to deliver. Patient is wanting a pack of Tradjenta to take until he can get his Januvia. We had no Januvia samples to give to patient. Patient will be back in the office tomorrow morning for bloodwork and to get his answer. Please Advise.

## 2018-01-29 ENCOUNTER — Other Ambulatory Visit: Payer: PPO

## 2018-01-29 ENCOUNTER — Ambulatory Visit: Payer: PPO | Admitting: Dietician

## 2018-01-29 ENCOUNTER — Ambulatory Visit: Payer: PPO

## 2018-01-29 DIAGNOSIS — N1831 Chronic kidney disease, stage 3a: Secondary | ICD-10-CM

## 2018-01-29 DIAGNOSIS — E7800 Pure hypercholesterolemia, unspecified: Secondary | ICD-10-CM | POA: Diagnosis not present

## 2018-01-29 DIAGNOSIS — I10 Essential (primary) hypertension: Secondary | ICD-10-CM | POA: Diagnosis not present

## 2018-01-29 DIAGNOSIS — E1159 Type 2 diabetes mellitus with other circulatory complications: Secondary | ICD-10-CM | POA: Diagnosis not present

## 2018-01-29 DIAGNOSIS — E1122 Type 2 diabetes mellitus with diabetic chronic kidney disease: Secondary | ICD-10-CM | POA: Diagnosis not present

## 2018-01-29 DIAGNOSIS — I152 Hypertension secondary to endocrine disorders: Secondary | ICD-10-CM

## 2018-01-29 DIAGNOSIS — N183 Chronic kidney disease, stage 3 unspecified: Secondary | ICD-10-CM

## 2018-01-30 LAB — COMPLETE METABOLIC PANEL WITH GFR
AG Ratio: 1.6 (calc) (ref 1.0–2.5)
ALT: 13 U/L (ref 9–46)
AST: 17 U/L (ref 10–35)
Albumin: 3.8 g/dL (ref 3.6–5.1)
Alkaline phosphatase (APISO): 53 U/L (ref 40–115)
BUN/Creatinine Ratio: 18 (calc) (ref 6–22)
BUN: 35 mg/dL — ABNORMAL HIGH (ref 7–25)
CO2: 30 mmol/L (ref 20–32)
Calcium: 9.8 mg/dL (ref 8.6–10.3)
Chloride: 106 mmol/L (ref 98–110)
Creat: 1.99 mg/dL — ABNORMAL HIGH (ref 0.70–1.11)
GFR, Est African American: 36 mL/min/{1.73_m2} — ABNORMAL LOW (ref >=60)
GFR, Est Non African American: 31 mL/min/{1.73_m2} — ABNORMAL LOW (ref >=60)
Globulin: 2.4 g/dL (calc) (ref 1.9–3.7)
Glucose, Bld: 125 mg/dL — ABNORMAL HIGH (ref 65–99)
Potassium: 4.5 mmol/L (ref 3.5–5.3)
Sodium: 140 mmol/L (ref 135–146)
Total Bilirubin: 0.4 mg/dL (ref 0.2–1.2)
Total Protein: 6.2 g/dL (ref 6.1–8.1)

## 2018-01-30 LAB — CBC WITH DIFFERENTIAL/PLATELET
Basophils Absolute: 74 cells/uL (ref 0–200)
Basophils Relative: 0.9 %
Eosinophils Absolute: 213 cells/uL (ref 15–500)
Eosinophils Relative: 2.6 %
HCT: 38.3 % — ABNORMAL LOW (ref 38.5–50.0)
Hemoglobin: 12.8 g/dL — ABNORMAL LOW (ref 13.2–17.1)
Lymphs Abs: 1845 cells/uL (ref 850–3900)
MCH: 32 pg (ref 27.0–33.0)
MCHC: 33.4 g/dL (ref 32.0–36.0)
MCV: 95.8 fL (ref 80.0–100.0)
MPV: 10.2 fL (ref 7.5–12.5)
Monocytes Relative: 10.3 %
Neutro Abs: 5223 cells/uL (ref 1500–7800)
Neutrophils Relative %: 63.7 %
Platelets: 160 10*3/uL (ref 140–400)
RBC: 4 10*6/uL — ABNORMAL LOW (ref 4.20–5.80)
RDW: 13.1 % (ref 11.0–15.0)
Total Lymphocyte: 22.5 %
WBC mixed population: 845 cells/uL (ref 200–950)
WBC: 8.2 10*3/uL (ref 3.8–10.8)

## 2018-01-30 LAB — HEMOGLOBIN A1C
Hgb A1c MFr Bld: 6.2 % of total Hgb — ABNORMAL HIGH (ref <5.7)
Mean Plasma Glucose: 131 (calc)
eAG (mmol/L): 7.3 (calc)

## 2018-01-30 LAB — LIPID PANEL
Cholesterol: 166 mg/dL (ref <200)
HDL: 50 mg/dL (ref >40)
LDL Cholesterol (Calc): 97 mg/dL (calc)
Non-HDL Cholesterol (Calc): 116 mg/dL (calc) (ref <130)
Total CHOL/HDL Ratio: 3.3 (calc) (ref <5.0)
Triglycerides: 91 mg/dL (ref <150)

## 2018-02-02 ENCOUNTER — Encounter: Payer: Self-pay | Admitting: *Deleted

## 2018-02-06 ENCOUNTER — Other Ambulatory Visit: Payer: Self-pay | Admitting: *Deleted

## 2018-02-06 MED ORDER — ONETOUCH DELICA LANCING DEV MISC: 0 refills | Status: DC

## 2018-02-16 ENCOUNTER — Other Ambulatory Visit: Payer: Self-pay | Admitting: Internal Medicine

## 2018-02-19 ENCOUNTER — Telehealth: Payer: Self-pay | Admitting: Pulmonary Disease

## 2018-02-19 MED ORDER — BUDESONIDE-FORMOTEROL FUMARATE 160-4.5 MCG/ACT IN AERO: 2.0000 | INHALATION_SPRAY | Freq: Two times a day (BID) | RESPIRATORY_TRACT | 0 refills | Status: DC

## 2018-02-19 NOTE — Telephone Encounter (Signed)
Spoke with the pt  He is wanting symbicort inhaler  Sample provided  Nothing further needed

## 2018-03-05 ENCOUNTER — Ambulatory Visit (INDEPENDENT_AMBULATORY_CARE_PROVIDER_SITE_OTHER): Payer: PPO | Admitting: Family

## 2018-03-05 ENCOUNTER — Encounter: Payer: Self-pay | Admitting: Family

## 2018-03-05 VITALS — BP 140/76 | HR 50 | Temp 98.0°F | Resp 16 | Ht 67.0 in | Wt 195.8 lb

## 2018-03-05 DIAGNOSIS — H6123 Impacted cerumen, bilateral: Secondary | ICD-10-CM

## 2018-03-05 NOTE — Progress Notes (Signed)
Provider: Coreena Rubalcava FNP-C  Gayland Curry, DO  Patient Care Team: Gayland Curry, DO as PCP - General (Geriatric Medicine) Zebedee Iba., MD as Referring Physician (Ophthalmology) Chesley Mires, MD as Consulting Physician (Pulmonary Disease)  Extended Emergency Contact Information Primary Emergency Contact: Thede,Matt  [place] Mobile Phone: [phone] Relation: Son   Goals of care: Advanced Directive information Advanced Directives 03/05/2018  Does Patient Have a Medical Advance Directive? Yes  Type of Paramedic of [place];Living will  Does patient want to make changes to medical advance directive? -  Copy of [place] in Chart? Yes  Pre-existing out of facility DNR order (yellow form or pink MOST form) -     Chief Complaint  Patient presents with  . Cerumen Impaction    HPI:  Pt is a 82 y.o. male seen today at [facility] office for an acute visit for cerumen removal.he states went to ENT to adjust his hearing aids and was told he needed his ears to be cleaned.He states he can't hear well with the hearing aids.He denies any pain in the ears,fullness,ringing or drainage.He further denies any fever or chills.     Past Medical History:  Diagnosis Date  . Allergy   . Alzheimer's disease   . Anemia, unspecified   . Anxiety   . Chronic maxillary sinusitis   . COPD (chronic obstructive pulmonary disease) ([place])   . Depression   . Diabetes mellitus   . Edema   . Hypercalcemia   . Hyperlipidemia   . Hypertension   . Hypertrophy of prostate without urinary obstruction and other lower urinary tract symptoms (LUTS)   . Hypertrophy of prostate without urinary obstruction and other lower urinary tract symptoms (LUTS)   . Kidney disease    mild  . Unspecified disorder of kidney and ureter   . Unspecified sleep apnea   . Ventral hernia, unspecified, without mention of obstruction or gangrene    Past Surgical  History:  Procedure Laterality Date  . EXPLORATORY LAPAROTOMY  2002  . HERNIA REPAIR      Allergies  Allergen Reactions  . Ace Inhibitors Cough    Outpatient Encounter Medications as of 03/05/2018  Medication Sig  . albuterol (PROVENTIL HFA;VENTOLIN HFA) 108 (90 Base) MCG/ACT inhaler Inhale 2 puffs into the lungs every 6 (six) hours as needed for wheezing or shortness of breath.  Marland Kitchen amLODipine (NORVASC) 10 MG tablet Take 1 tablet (10 mg total) by mouth daily.  Marland Kitchen aspirin EC 81 MG tablet Take 1 tablet (81 mg total) by mouth daily.  Marland Kitchen atorvastatin (LIPITOR) 20 MG tablet Take one tablet by mouth once daily for cholesterol  . budesonide-formoterol (SYMBICORT) 160-4.5 MCG/ACT inhaler Inhale 2 puffs into the lungs 2 (two) times daily.  Marland Kitchen glucose blood (ONE TOUCH ULTRA TEST) test strip USE AS INSTRUCTED TO TEST BLOOD SUGAR ONCE DAILY DX E119  . hydrALAZINE (APRESOLINE) 25 MG tablet Take 1 tablet (25 mg total) by mouth 2 (two) times daily.  Marland Kitchen ipratropium-albuterol (DUONEB) 0.5-2.5 (3) MG/3ML SOLN Take 3 mLs by nebulization every 6 (six) hours as needed (wheezing).  Elmore Guise Devices (ONE TOUCH DELICA LANCING DEV) MISC Use to test blood sugar once daily dx: E119  . Lancets (ONETOUCH ULTRASOFT) lancets Use to test blood sugar up to three times daily. Dx E11.9  . losartan (COZAAR) 25 MG tablet Take 1 tablet (25 mg total) by mouth daily.  . metoprolol tartrate (LOPRESSOR) 50 MG tablet Take  1 tablet (50 mg total) by mouth 2 (two) times daily.  . montelukast (SINGULAIR) 10 MG tablet Take 1 tablet (10 mg total) by mouth at bedtime.  Marland Kitchen PARoxetine (PAXIL) 20 MG tablet Take 1 tablet by mouth once daily for depression  . polyethylene glycol powder (GLYCOLAX/MIRALAX) powder Take 17 g by mouth every 3 (three) days. For constipation; hold for loose stools  . sitaGLIPtin (JANUVIA) 25 MG tablet Take 1 tablet (25 mg total) by mouth daily.  . traMADol (ULTRAM) 50 MG tablet TAKE 1 TABLET BY MOUTH TWICE A DAY  .  traZODone (DESYREL) 50 MG tablet Take 1 tablet (50 mg total) by mouth at bedtime.   No facility-administered encounter medications on file as of 03/05/2018.     Review of Systems  Constitutional: Negative for chills, fatigue and fever.  HENT: Positive for hearing loss. Negative for congestion, ear discharge, ear pain, postnasal drip, rhinorrhea, sinus pressure, sinus pain, sneezing, sore throat and tinnitus.   Eyes: Negative for discharge, redness and itching.  Respiratory: Negative for cough, chest tightness, shortness of breath and wheezing.   Cardiovascular: Negative for chest pain, palpitations and leg swelling.  Skin: Negative for color change, pallor and rash.  Neurological: Negative for dizziness, light-headedness and headaches.  Psychiatric/Behavioral: Negative for agitation. The patient is not nervous/anxious.     Immunization History  Administered Date(s) Administered  . Influenza Split 04/08/2011  . Influenza Whole 04/07/2012  . Influenza, High Dose Seasonal PF 04/07/2017  . Influenza,inj,Quad PF,6+ Mos 04/23/2013, 03/28/2014, 02/23/2016  . Influenza-Unspecified 04/08/2015, 07/09/2015, 04/08/2017  . Pneumococcal Conjugate-13 06/06/2014  . Pneumococcal-Unspecified 07/09/2007  . Td 08/09/1997  . Tdap 08/12/2011   Pertinent  Health Maintenance Due  Topic Date Due  . OPHTHALMOLOGY EXAM  08/27/2015  . INFLUENZA VACCINE  02/05/2018  . FOOT EXAM  07/03/2018  . HEMOGLOBIN A1C  08/01/2018  . PNA vac Low Risk Adult  Completed   Fall Risk  01/22/2018 12/16/2017 09/11/2017 09/01/2017 07/03/2017  Falls in the past year? No No Yes No No  Number falls in past yr: - - 2 or more - -    Vitals:   03/05/18 1110  BP: 140/76  Pulse: (!) 50  Resp: 16  Temp: 98 F (36.7 C)  TempSrc: Oral  SpO2: 93%  Weight: 195 lb 12.8 oz (88.8 kg)  Height: 5\' 7"  (1.702 m)   Body mass index is 30.67 kg/m. Physical Exam  Constitutional: He is oriented to person, place, and time. He appears  well-developed and well-nourished.  Elderly in no acute distress    HENT:  Head: Normocephalic.  Mouth/Throat: Oropharynx is clear and moist. No oropharyngeal exudate.  Bilateral cerumen impaction.Ear lavaged with warm water and cerumen removed with curette.Large amount of cerumen removed with curette from left ear and moderate amounts from the right ear.bilateral TM clear.No redness or drainage noted.    Eyes: Pupils are equal, round, and reactive to light. Conjunctivae and EOM are normal. Right eye exhibits no discharge. Left eye exhibits no discharge. No scleral icterus.  Wears eye glasses   Neck: Normal range of motion. No JVD present. No thyromegaly present.  Cardiovascular: Normal rate, regular rhythm, normal heart sounds and intact distal pulses. Exam reveals no gallop and no friction rub.  No murmur heard. Pulmonary/Chest: Effort normal and breath sounds normal. No respiratory distress. He has no wheezes. He has no rales.  Abdominal: Soft. Bowel sounds are normal. He exhibits no distension and no mass. There is no tenderness. There is  no rebound and no guarding.  Lymphadenopathy:    He has no cervical adenopathy.  Neurological: He is oriented to person, place, and time.  HOH wears bilateral hearing aids   Skin: Skin is warm and dry. No rash noted. No erythema. No pallor.  Psychiatric: He has a normal mood and affect. His speech is normal and behavior is normal. Thought content normal.  Vitals reviewed.   Labs reviewed: Recent Labs    10/09/17 1137 10/30/17 0859 01/29/18 0809  NA 141 140 140  K 4.4 4.3 4.5  CL 108 106 106  CO2 29 30 30   GLUCOSE 162* 141* 125*  BUN 29* 31* 35*  CREATININE 2.21* 1.77* 1.99*  CALCIUM 9.5 9.7 9.8   Recent Labs    01/29/18 0809  AST 17  ALT 13  BILITOT 0.4  PROT 6.2   Recent Labs    10/01/17 1400 10/09/17 1137 01/29/18 0809  WBC 16.8* 10.4 8.2  NEUTROABS 12,550* 6,791 5,223  HGB 13.0* 12.4* 12.8*  HCT 37.5* 36.0* 38.3*  MCV  93.8 94.0 95.8  PLT 215 238 160   No results found for: TSH Lab Results  Component Value Date   HGBA1C 6.2 (H) 01/29/2018   Lab Results  Component Value Date   CHOL 166 01/29/2018   HDL 50 01/29/2018   LDLCALC 97 01/29/2018   TRIG 91 01/29/2018   CHOLHDL 3.3 01/29/2018    Significant Diagnostic Results in last 30 days:  No results found.  Assessment/Plan   Bilateral impacted cerumen Afebrile.Bilateral cerumen lavaged with warm water and removed with currete.TM clear no redness,tenderness or drainage noted.patient tolerated procedure well. Notify provider for any pain in the ear or signs of infections. Follow up as needed.   Family/ staff Communication: Reviewed plan of care with patient.   Labs/tests ordered: None   Meliya Mcconahy C Lovely Kerins, NP

## 2018-03-23 ENCOUNTER — Encounter: Payer: Self-pay | Admitting: *Deleted

## 2018-04-02 ENCOUNTER — Other Ambulatory Visit: Payer: Self-pay | Admitting: *Deleted

## 2018-04-03 ENCOUNTER — Other Ambulatory Visit: Payer: Self-pay | Admitting: *Deleted

## 2018-04-03 NOTE — Patient Outreach (Signed)
[facility]) Care Management  04/03/2018  Bralin Garry 03/31/1936 [id number]    Unsuccessful Outreach  RN attempted to reach pt via home number however unable to leave message. RN also attempted to call the pt's son Catalina [place] Adler @ [phone]) was also unsuccessful but able to leave a HIPAA approved voice message requesting a call back. Will scheduled another follow up next week.  [name], RN Care Management Coordinator [place] Office [phone]

## 2018-04-06 ENCOUNTER — Ambulatory Visit (INDEPENDENT_AMBULATORY_CARE_PROVIDER_SITE_OTHER): Payer: PPO | Admitting: Nurse Practitioner

## 2018-04-06 ENCOUNTER — Encounter: Payer: Self-pay | Admitting: Nurse Practitioner

## 2018-04-06 VITALS — BP 138/82 | HR 69 | Temp 97.8°F | Ht 67.0 in | Wt 199.0 lb

## 2018-04-06 DIAGNOSIS — J440 Chronic obstructive pulmonary disease with acute lower respiratory infection: Secondary | ICD-10-CM | POA: Diagnosis not present

## 2018-04-06 DIAGNOSIS — J209 Acute bronchitis, unspecified: Secondary | ICD-10-CM | POA: Diagnosis not present

## 2018-04-06 DIAGNOSIS — R0902 Hypoxemia: Secondary | ICD-10-CM | POA: Diagnosis not present

## 2018-04-06 DIAGNOSIS — J441 Chronic obstructive pulmonary disease with (acute) exacerbation: Secondary | ICD-10-CM | POA: Diagnosis not present

## 2018-04-06 MED ORDER — PREDNISONE 20 MG PO TABS: 20.0000 mg | ORAL_TABLET | Freq: Every day | ORAL | 0 refills | Status: DC

## 2018-04-06 MED ORDER — DOXYCYCLINE HYCLATE 100 MG PO TABS: 100.0000 mg | ORAL_TABLET | Freq: Two times a day (BID) | ORAL | 0 refills | Status: DC

## 2018-04-06 NOTE — Progress Notes (Signed)
Careteam: Patient Care Team: Gayland Curry, DO as PCP - General (Geriatric Medicine) Zebedee Iba., MD as Referring Physician (Ophthalmology) Chesley Mires, MD as Consulting Physician (Pulmonary Disease) Tobi Bastos, RN as [place] Management  Advanced Directive information Does Patient Have a Medical Advance Directive?: Yes, Type of Advance Directive: [place];Living will  Allergies  Allergen Reactions  . Ace Inhibitors Cough    Chief Complaint  Patient presents with  . Acute Visit    Pt is being seen due to head/chest congession for about 5 days.      HPI: Patient is a 82 y.o. male seen in the office today due to cold in head and chest. Reports he went out of town for a week and then symptoms started 4 days ago. Report congestion in head and chest. Taking cough syrup. Reports family was sick as well.  No fever.  No shortness of breath.  Reports cough, unsure if he is wheezing Breathing with his mouth due to congestion in his nose.  Takes Symbicort twice a day and used albuterol PRN- has needed it maybe twice.  Pt with COPD with asthma.   Review of Systems:  Review of Systems  Constitutional: Positive for malaise/fatigue. Negative for chills and fever.  HENT: Positive for congestion. Negative for sore throat.   Respiratory: Positive for cough. Negative for sputum production, shortness of breath and wheezing.   Cardiovascular: Negative for chest pain, palpitations and leg swelling.  Skin: Negative for itching and rash.     Past Medical History:  Diagnosis Date  . Allergy   . Alzheimer's disease   . Anemia, unspecified   . Anxiety   . Chronic maxillary sinusitis   . COPD (chronic obstructive pulmonary disease) ([place])   . Depression   . Diabetes mellitus   . Edema   . Hypercalcemia   . Hyperlipidemia   . Hypertension   . Hypertrophy of prostate without urinary obstruction and other lower urinary tract symptoms  (LUTS)   . Hypertrophy of prostate without urinary obstruction and other lower urinary tract symptoms (LUTS)   . Kidney disease    mild  . Unspecified disorder of kidney and ureter   . Unspecified sleep apnea   . Ventral hernia, unspecified, without mention of obstruction or gangrene    Past Surgical History:  Procedure Laterality Date  . EXPLORATORY LAPAROTOMY  2002  . HERNIA REPAIR     Social History:   reports that he quit smoking about 29 years ago. His smoking use included cigarettes. He has a 45.00 pack-year smoking history. He has never used smokeless tobacco. He reports that he does not drink alcohol or use drugs.  Family History  Problem Relation Age of Onset  . Pancreatic cancer Sister   . Heart disease Father   . Hypertension Brother   . Hypertension Sister     Medications: Patient's Medications  New Prescriptions   No medications on file  Previous Medications   ALBUTEROL (PROVENTIL HFA;VENTOLIN HFA) 108 (90 BASE) MCG/ACT INHALER    Inhale 2 puffs into the lungs every 6 (six) hours as needed for wheezing or shortness of breath.   AMLODIPINE (NORVASC) 10 MG TABLET    Take 1 tablet (10 mg total) by mouth daily.   ASPIRIN EC 81 MG TABLET    Take 1 tablet (81 mg total) by mouth daily.   ATORVASTATIN (LIPITOR) 20 MG TABLET    Take one tablet by mouth once  daily for cholesterol   BUDESONIDE-FORMOTEROL (SYMBICORT) 160-4.5 MCG/ACT INHALER    Inhale 2 puffs into the lungs 2 (two) times daily.   GLUCOSE BLOOD (ONE TOUCH ULTRA TEST) TEST STRIP    USE AS INSTRUCTED TO TEST BLOOD SUGAR ONCE DAILY DX E119   HYDRALAZINE (APRESOLINE) 25 MG TABLET    Take 1 tablet (25 mg total) by mouth 2 (two) times daily.   IPRATROPIUM-ALBUTEROL (DUONEB) 0.5-2.5 (3) MG/3ML SOLN    Take 3 mLs by nebulization every 6 (six) hours as needed (wheezing).   LANCET DEVICES (ONE TOUCH DELICA LANCING DEV) MISC    Use to test blood sugar once daily dx: E119   LANCETS (ONETOUCH ULTRASOFT) LANCETS    Use to test  blood sugar up to three times daily. Dx E11.9   LOSARTAN (COZAAR) 25 MG TABLET    Take 1 tablet (25 mg total) by mouth daily.   METOPROLOL TARTRATE (LOPRESSOR) 50 MG TABLET    Take 1 tablet (50 mg total) by mouth 2 (two) times daily.   MONTELUKAST (SINGULAIR) 10 MG TABLET    Take 1 tablet (10 mg total) by mouth at bedtime.   PAROXETINE (PAXIL) 20 MG TABLET    Take 1 tablet by mouth once daily for depression   POLYETHYLENE GLYCOL POWDER (GLYCOLAX/MIRALAX) POWDER    Take 17 g by mouth every 3 (three) days. For constipation; hold for loose stools   SITAGLIPTIN (JANUVIA) 25 MG TABLET    Take 1 tablet (25 mg total) by mouth daily.   TRAMADOL (ULTRAM) 50 MG TABLET    TAKE 1 TABLET BY MOUTH TWICE A DAY   TRAZODONE (DESYREL) 50 MG TABLET    Take 1 tablet (50 mg total) by mouth at bedtime.  Modified Medications   No medications on file  Discontinued Medications   No medications on file     Physical Exam:  Vitals:   04/06/18 1459  BP: 138/82  Pulse: 69  Temp: 97.8 F (36.6 C)  TempSrc: Oral  SpO2: 97%  Weight: 199 lb (90.3 kg)  Height: 5\' 7"  (1.702 m)   Body mass index is 31.17 kg/m.  Physical Exam  Constitutional: He appears well-developed and well-nourished.  HENT:  Head: Normocephalic and atraumatic.  Right Ear: External ear normal.  Left Ear: External ear normal.  Nose: Nose normal.  Mouth/Throat: Oropharynx is clear and moist. No oropharyngeal exudate.  Eyes: Pupils are equal, round, and reactive to light. Conjunctivae and EOM are normal.  Neck: Normal range of motion. Neck supple.  Cardiovascular: Normal rate and regular rhythm.  Pulmonary/Chest: Effort normal. He has wheezes (right lobe).  Lymphadenopathy:    He has no cervical adenopathy.  Skin: Skin is warm and dry.    Labs reviewed: Basic Metabolic Panel: Recent Labs    10/09/17 1137 10/30/17 0859 01/29/18 0809  NA 141 140 140  K 4.4 4.3 4.5  CL 108 106 106  CO2 29 30 30   GLUCOSE 162* 141* 125*  BUN 29*  31* 35*  CREATININE 2.21* 1.77* 1.99*  CALCIUM 9.5 9.7 9.8   Liver Function Tests: Recent Labs    01/29/18 0809  AST 17  ALT 13  BILITOT 0.4  PROT 6.2   No results for input(s): LIPASE, AMYLASE in the last 8760 hours. No results for input(s): AMMONIA in the last 8760 hours. CBC: Recent Labs    10/01/17 1400 10/09/17 1137 01/29/18 0809  WBC 16.8* 10.4 8.2  NEUTROABS 12,550* 6,791 5,223  HGB 13.0* 12.4* 12.8*  HCT 37.5* 36.0* 38.3*  MCV 93.8 94.0 95.8  PLT 215 238 160   Lipid Panel: Recent Labs    06/06/17 0806 10/30/17 0859 01/29/18 0809  CHOL 173 170 166  HDL 50 48 50  LDLCALC 102* 101* 97  TRIG 116 118 91  CHOLHDL 3.5 3.5 3.3   TSH: No results for input(s): TSH in the last 8760 hours. A1C: Lab Results  Component Value Date   HGBA1C 6.2 (H) 01/29/2018     Assessment/Plan 1. Acute bronchitis with COPD ([place]) - predniSONE (DELTASONE) 20 MG tablet; Take 1 tablet (20 mg total) by mouth daily with breakfast.  Dispense: 6 tablet; Refill: 0 - doxycycline (VIBRA-TABS) 100 MG tablet; Take 1 tablet (100 mg total) by mouth 2 (two) times daily.  Dispense: 14 tablet; Refill: 0 - mucinex DM by mouth twice daily for 1 week -encouraged proper nutrition and hydration -follow up if symptoms worsen or fail to improve.    Carlos American. Gun[place], [facility] [phone]

## 2018-04-06 NOTE — Patient Instructions (Signed)
To use mucinex DM by mouth twice daily with full glass of water for 7 days  To start doxycycline 100 mg by mouth twice daily for 7 days-- take with food Prednisone 20 mg by mouth twice daily for 3 days  To increase water   Acute Bronchitis, Adult Acute bronchitis is when air tubes (bronchi) in the lungs suddenly get swollen. The condition can make it hard to breathe. It can also cause these symptoms:  A cough.  Coughing up clear, yellow, or green mucus.  Wheezing.  Chest congestion.  Shortness of breath.  A fever.  Body aches.  Chills.  A sore throat.  Follow these instructions at home: Medicines  Take over-the-counter and prescription medicines only as told by your doctor.  If you were prescribed an antibiotic medicine, take it as told by your doctor. Do not stop taking the antibiotic even if you start to feel better. General instructions  Rest.  Drink enough fluids to keep your pee (urine) clear or pale yellow.  Avoid smoking and secondhand smoke. If you smoke and you need help quitting, ask your doctor. Quitting will help your lungs heal faster.  Use an inhaler, cool mist vaporizer, or humidifier as told by your doctor.  Keep all follow-up visits as told by your doctor. This is important. How is this prevented? To lower your risk of getting this condition again:  Wash your hands often with soap and water. If you cannot use soap and water, use hand sanitizer.  Avoid contact with people who have cold symptoms.  Try not to touch your hands to your mouth, nose, or eyes.  Make sure to get the flu shot every year.  Contact a doctor if:  Your symptoms do not get better in 2 weeks. Get help right away if:  You cough up blood.  You have chest pain.  You have very bad shortness of breath.  You become dehydrated.  You faint (pass out) or keep feeling like you are going to pass out.  You keep throwing up (vomiting).  You have a very bad headache.  Your  fever or chills gets worse. This information is not intended to replace advice given to you by your health care provider. Make sure you discuss any questions you have with your health care provider. Document Released: 12/11/2007 Document Revised: 01/31/2016 Document Reviewed: 12/13/2015 Elsevier Interactive Patient Education  [employer].

## 2018-04-07 ENCOUNTER — Inpatient Hospital Stay (HOSPITAL_COMMUNITY)
Admission: EM | Admit: 2018-04-07 | Discharge: 2018-04-09 | DRG: 190 | Disposition: A | Discharge status: Home Health | Payer: PPO | Attending: Internal Medicine | Admitting: Internal Medicine

## 2018-04-07 ENCOUNTER — Other Ambulatory Visit: Payer: Self-pay

## 2018-04-07 ENCOUNTER — Ambulatory Visit: Payer: Self-pay | Admitting: *Deleted

## 2018-04-07 ENCOUNTER — Encounter (HOSPITAL_COMMUNITY): Payer: Self-pay

## 2018-04-07 ENCOUNTER — Emergency Department (HOSPITAL_COMMUNITY): Payer: PPO

## 2018-04-07 DIAGNOSIS — J9621 Acute and chronic respiratory failure with hypoxia: Secondary | ICD-10-CM | POA: Diagnosis present

## 2018-04-07 DIAGNOSIS — J320 Chronic maxillary sinusitis: Secondary | ICD-10-CM | POA: Diagnosis present

## 2018-04-07 DIAGNOSIS — R05 Cough: Secondary | ICD-10-CM | POA: Diagnosis not present

## 2018-04-07 DIAGNOSIS — G309 Alzheimer's disease, unspecified: Secondary | ICD-10-CM | POA: Diagnosis present

## 2018-04-07 DIAGNOSIS — Z800 Family history of malignant neoplasm of digestive organs: Secondary | ICD-10-CM

## 2018-04-07 DIAGNOSIS — F329 Major depressive disorder, single episode, unspecified: Secondary | ICD-10-CM | POA: Diagnosis not present

## 2018-04-07 DIAGNOSIS — N183 Chronic kidney disease, stage 3 unspecified: Secondary | ICD-10-CM | POA: Diagnosis present

## 2018-04-07 DIAGNOSIS — G4700 Insomnia, unspecified: Secondary | ICD-10-CM | POA: Diagnosis present

## 2018-04-07 DIAGNOSIS — I5032 Chronic diastolic (congestive) heart failure: Secondary | ICD-10-CM | POA: Diagnosis present

## 2018-04-07 DIAGNOSIS — Z7984 Long term (current) use of oral hypoglycemic drugs: Secondary | ICD-10-CM | POA: Diagnosis not present

## 2018-04-07 DIAGNOSIS — I10 Essential (primary) hypertension: Secondary | ICD-10-CM | POA: Diagnosis present

## 2018-04-07 DIAGNOSIS — J209 Acute bronchitis, unspecified: Secondary | ICD-10-CM | POA: Diagnosis not present

## 2018-04-07 DIAGNOSIS — F0280 Dementia in other diseases classified elsewhere without behavioral disturbance: Secondary | ICD-10-CM | POA: Diagnosis not present

## 2018-04-07 DIAGNOSIS — R0902 Hypoxemia: Secondary | ICD-10-CM | POA: Diagnosis not present

## 2018-04-07 DIAGNOSIS — B9789 Other viral agents as the cause of diseases classified elsewhere: Secondary | ICD-10-CM | POA: Diagnosis not present

## 2018-04-07 DIAGNOSIS — F32A Depression, unspecified: Secondary | ICD-10-CM | POA: Diagnosis present

## 2018-04-07 DIAGNOSIS — M5441 Lumbago with sciatica, right side: Secondary | ICD-10-CM | POA: Diagnosis present

## 2018-04-07 DIAGNOSIS — E1129 Type 2 diabetes mellitus with other diabetic kidney complication: Secondary | ICD-10-CM | POA: Diagnosis present

## 2018-04-07 DIAGNOSIS — Z6830 Body mass index (BMI) 30.0-30.9, adult: Secondary | ICD-10-CM

## 2018-04-07 DIAGNOSIS — Z8249 Family history of ischemic heart disease and other diseases of the circulatory system: Secondary | ICD-10-CM | POA: Diagnosis not present

## 2018-04-07 DIAGNOSIS — F419 Anxiety disorder, unspecified: Secondary | ICD-10-CM | POA: Diagnosis present

## 2018-04-07 DIAGNOSIS — Z888 Allergy status to other drugs, medicaments and biological substances status: Secondary | ICD-10-CM | POA: Diagnosis not present

## 2018-04-07 DIAGNOSIS — Z79891 Long term (current) use of opiate analgesic: Secondary | ICD-10-CM

## 2018-04-07 DIAGNOSIS — Z7951 Long term (current) use of inhaled steroids: Secondary | ICD-10-CM

## 2018-04-07 DIAGNOSIS — J218 Acute bronchiolitis due to other specified organisms: Secondary | ICD-10-CM | POA: Diagnosis not present

## 2018-04-07 DIAGNOSIS — Z7982 Long term (current) use of aspirin: Secondary | ICD-10-CM

## 2018-04-07 DIAGNOSIS — Z87891 Personal history of nicotine dependence: Secondary | ICD-10-CM | POA: Diagnosis not present

## 2018-04-07 DIAGNOSIS — Z7952 Long term (current) use of systemic steroids: Secondary | ICD-10-CM

## 2018-04-07 DIAGNOSIS — J440 Chronic obstructive pulmonary disease with acute lower respiratory infection: Secondary | ICD-10-CM | POA: Diagnosis not present

## 2018-04-07 DIAGNOSIS — G4733 Obstructive sleep apnea (adult) (pediatric): Secondary | ICD-10-CM | POA: Diagnosis not present

## 2018-04-07 DIAGNOSIS — J441 Chronic obstructive pulmonary disease with (acute) exacerbation: Principal | ICD-10-CM | POA: Diagnosis present

## 2018-04-07 DIAGNOSIS — R0602 Shortness of breath: Secondary | ICD-10-CM | POA: Diagnosis not present

## 2018-04-07 DIAGNOSIS — E785 Hyperlipidemia, unspecified: Secondary | ICD-10-CM | POA: Diagnosis not present

## 2018-04-07 DIAGNOSIS — E1122 Type 2 diabetes mellitus with diabetic chronic kidney disease: Secondary | ICD-10-CM | POA: Diagnosis not present

## 2018-04-07 DIAGNOSIS — I130 Hypertensive heart and chronic kidney disease with heart failure and stage 1 through stage 4 chronic kidney disease, or unspecified chronic kidney disease: Secondary | ICD-10-CM | POA: Diagnosis not present

## 2018-04-07 DIAGNOSIS — N400 Enlarged prostate without lower urinary tract symptoms: Secondary | ICD-10-CM | POA: Diagnosis not present

## 2018-04-07 DIAGNOSIS — Z79899 Other long term (current) drug therapy: Secondary | ICD-10-CM

## 2018-04-07 DIAGNOSIS — E119 Type 2 diabetes mellitus without complications: Secondary | ICD-10-CM | POA: Diagnosis present

## 2018-04-07 DIAGNOSIS — E669 Obesity, unspecified: Secondary | ICD-10-CM | POA: Diagnosis present

## 2018-04-07 LAB — RESPIRATORY PANEL BY PCR
ADENOVIRUS-RVPPCR: NOT DETECTED
Bordetella pertussis: NOT DETECTED
CHLAMYDOPHILA PNEUMONIAE-RVPPCR: NOT DETECTED
CORONAVIRUS 229E-RVPPCR: NOT DETECTED
CORONAVIRUS HKU1-RVPPCR: NOT DETECTED
CORONAVIRUS OC43-RVPPCR: NOT DETECTED
Coronavirus NL63: NOT DETECTED
INFLUENZA A-RVPPCR: NOT DETECTED
Influenza B: NOT DETECTED
MYCOPLASMA PNEUMONIAE-RVPPCR: NOT DETECTED
Metapneumovirus: NOT DETECTED
PARAINFLUENZA VIRUS 1-RVPPCR: NOT DETECTED
PARAINFLUENZA VIRUS 4-RVPPCR: NOT DETECTED
Parainfluenza Virus 2: NOT DETECTED
Parainfluenza Virus 3: NOT DETECTED
Respiratory Syncytial Virus: NOT DETECTED
Rhinovirus / Enterovirus: DETECTED — AB

## 2018-04-07 LAB — BASIC METABOLIC PANEL
Anion gap: 10 (ref 5–15)
BUN: 44 mg/dL — AB (ref 8–23)
CO2: 28 mmol/L (ref 22–32)
Calcium: 10.2 mg/dL (ref 8.9–10.3)
Chloride: 105 mmol/L (ref 98–111)
Creatinine, Ser: 2.2 mg/dL — ABNORMAL HIGH (ref 0.61–1.24)
GFR calc Af Amer: 31 mL/min — ABNORMAL LOW (ref >60)
GFR calc non Af Amer: 27 mL/min — ABNORMAL LOW (ref >60)
GLUCOSE: 165 mg/dL — AB (ref 70–99)
POTASSIUM: 4.3 mmol/L (ref 3.5–5.1)
Sodium: 143 mmol/L (ref 135–145)

## 2018-04-07 LAB — CBC
HCT: 41 % (ref 39.0–52.0)
Hemoglobin: 13.4 g/dL (ref 13.0–17.0)
MCH: 32.4 pg (ref 26.0–34.0)
MCHC: 32.7 g/dL (ref 30.0–36.0)
MCV: 99.3 fL (ref 78.0–100.0)
PLATELETS: 143 10*3/uL — AB (ref 150–400)
RBC: 4.13 MIL/uL — AB (ref 4.22–5.81)
RDW: 13.7 % (ref 11.5–15.5)
WBC: 13 10*3/uL — ABNORMAL HIGH (ref 4.0–10.5)

## 2018-04-07 LAB — BRAIN NATRIURETIC PEPTIDE: B Natriuretic Peptide: 436.2 pg/mL — ABNORMAL HIGH (ref 0.0–100.0)

## 2018-04-07 LAB — GLUCOSE, CAPILLARY
GLUCOSE-CAPILLARY: 178 mg/dL — AB (ref 70–99)
Glucose-Capillary: 134 mg/dL — ABNORMAL HIGH (ref 70–99)
Glucose-Capillary: 191 mg/dL — ABNORMAL HIGH (ref 70–99)
Glucose-Capillary: 250 mg/dL — ABNORMAL HIGH (ref 70–99)

## 2018-04-07 LAB — LACTIC ACID, PLASMA
Lactic Acid, Venous: 1.1 mmol/L (ref 0.5–1.9)
Lactic Acid, Venous: 1.4 mmol/L (ref 0.5–1.9)

## 2018-04-07 LAB — I-STAT CG4 LACTIC ACID, ED: Lactic Acid, Venous: 1.27 mmol/L (ref 0.5–1.9)

## 2018-04-07 LAB — PROCALCITONIN: Procalcitonin: <0.1 ng/mL

## 2018-04-07 LAB — TROPONIN I: Troponin I: <0.03 ng/mL (ref <0.03)

## 2018-04-07 MED ORDER — HEPARIN SODIUM (PORCINE) 5000 UNIT/ML IJ SOLN
5000.0000 [IU] | Freq: Three times a day (TID) | INTRAMUSCULAR | Status: DC
  Administered 2018-04-07 – 2018-04-09 (×7): 5000 [IU] via SUBCUTANEOUS
  Filled 2018-04-07 (×7): qty 1

## 2018-04-07 MED ORDER — MOMETASONE FURO-FORMOTEROL FUM 200-5 MCG/ACT IN AERO
2.0000 | INHALATION_SPRAY | Freq: Two times a day (BID) | RESPIRATORY_TRACT | Status: DC
  Administered 2018-04-07 – 2018-04-09 (×5): 2 via RESPIRATORY_TRACT
  Filled 2018-04-07: qty 8.8

## 2018-04-07 MED ORDER — DM-GUAIFENESIN ER 30-600 MG PO TB12: 1.0000 | ORAL_TABLET | Freq: Two times a day (BID) | ORAL | Status: DC | PRN

## 2018-04-07 MED ORDER — SODIUM CHLORIDE 0.9 % IV SOLN
Freq: Once | INTRAVENOUS | Status: AC
  Administered 2018-04-07: 04:00:00 via INTRAVENOUS

## 2018-04-07 MED ORDER — SODIUM CHLORIDE 0.9 % IV SOLN
1.0000 g | Freq: Once | INTRAVENOUS | Status: AC
  Administered 2018-04-07: 1 g via INTRAVENOUS
  Filled 2018-04-07: qty 10

## 2018-04-07 MED ORDER — LOSARTAN POTASSIUM 25 MG PO TABS
25.0000 mg | ORAL_TABLET | Freq: Every day | ORAL | Status: DC
  Administered 2018-04-07 – 2018-04-09 (×3): 25 mg via ORAL
  Filled 2018-04-07 (×3): qty 1

## 2018-04-07 MED ORDER — ALBUTEROL SULFATE (2.5 MG/3ML) 0.083% IN NEBU: 2.5000 mg | INHALATION_SOLUTION | RESPIRATORY_TRACT | Status: DC | PRN

## 2018-04-07 MED ORDER — INSULIN ASPART 100 UNIT/ML ~~LOC~~ SOLN
0.0000 [IU] | Freq: Three times a day (TID) | SUBCUTANEOUS | Status: DC
  Administered 2018-04-07: 1 [IU] via SUBCUTANEOUS
  Administered 2018-04-07 (×2): 2 [IU] via SUBCUTANEOUS
  Administered 2018-04-08 (×2): 3 [IU] via SUBCUTANEOUS
  Administered 2018-04-08: 2 [IU] via SUBCUTANEOUS
  Administered 2018-04-09: 3 [IU] via SUBCUTANEOUS

## 2018-04-07 MED ORDER — ATORVASTATIN CALCIUM 20 MG PO TABS
20.0000 mg | ORAL_TABLET | Freq: Every day | ORAL | Status: DC
  Administered 2018-04-07 – 2018-04-08 (×2): 20 mg via ORAL
  Filled 2018-04-07 (×3): qty 1

## 2018-04-07 MED ORDER — ZOLPIDEM TARTRATE 5 MG PO TABS: 5.0000 mg | ORAL_TABLET | Freq: Every evening | ORAL | Status: DC | PRN

## 2018-04-07 MED ORDER — INSULIN ASPART 100 UNIT/ML ~~LOC~~ SOLN
0.0000 [IU] | Freq: Every day | SUBCUTANEOUS | Status: DC
  Administered 2018-04-07: 2 [IU] via SUBCUTANEOUS
  Administered 2018-04-08: 3 [IU] via SUBCUTANEOUS

## 2018-04-07 MED ORDER — HYDRALAZINE HCL 20 MG/ML IJ SOLN: 5.0000 mg | INTRAMUSCULAR | Status: DC | PRN

## 2018-04-07 MED ORDER — HYDRALAZINE HCL 25 MG PO TABS
25.0000 mg | ORAL_TABLET | Freq: Two times a day (BID) | ORAL | Status: DC
  Administered 2018-04-07 – 2018-04-09 (×5): 25 mg via ORAL
  Filled 2018-04-07 (×5): qty 1

## 2018-04-07 MED ORDER — ALBUTEROL SULFATE (2.5 MG/3ML) 0.083% IN NEBU
5.0000 mg | INHALATION_SOLUTION | Freq: Once | RESPIRATORY_TRACT | Status: AC
  Administered 2018-04-07: 5 mg via RESPIRATORY_TRACT

## 2018-04-07 MED ORDER — PAROXETINE HCL 20 MG PO TABS
20.0000 mg | ORAL_TABLET | Freq: Every day | ORAL | Status: DC
  Administered 2018-04-07 – 2018-04-09 (×3): 20 mg via ORAL
  Filled 2018-04-07 (×3): qty 1

## 2018-04-07 MED ORDER — TRAMADOL HCL 50 MG PO TABS: 50.0000 mg | ORAL_TABLET | Freq: Two times a day (BID) | ORAL | Status: DC | PRN

## 2018-04-07 MED ORDER — MONTELUKAST SODIUM 10 MG PO TABS
10.0000 mg | ORAL_TABLET | Freq: Every day | ORAL | Status: DC
  Administered 2018-04-07 – 2018-04-08 (×2): 10 mg via ORAL
  Filled 2018-04-07 (×2): qty 1

## 2018-04-07 MED ORDER — DEXTROMETHORPHAN POLISTIREX ER 30 MG/5ML PO SUER
15.0000 mg | Freq: Two times a day (BID) | ORAL | Status: DC
  Administered 2018-04-07 – 2018-04-09 (×5): 15 mg via ORAL
  Filled 2018-04-07 (×5): qty 5

## 2018-04-07 MED ORDER — SODIUM CHLORIDE 0.9 % IV SOLN
500.0000 mg | Freq: Once | INTRAVENOUS | Status: AC
  Administered 2018-04-07: 500 mg via INTRAVENOUS
  Filled 2018-04-07: qty 500

## 2018-04-07 MED ORDER — IPRATROPIUM-ALBUTEROL 0.5-2.5 (3) MG/3ML IN SOLN
3.0000 mL | RESPIRATORY_TRACT | Status: DC
  Administered 2018-04-07 (×2): 3 mL via RESPIRATORY_TRACT
  Filled 2018-04-07 (×2): qty 3

## 2018-04-07 MED ORDER — POLYETHYLENE GLYCOL 3350 17 G PO PACK
17.0000 g | PACK | Freq: Every day | ORAL | Status: DC | PRN
  Administered 2018-04-07 – 2018-04-08 (×2): 17 g via ORAL
  Filled 2018-04-07 (×4): qty 1

## 2018-04-07 MED ORDER — ONDANSETRON HCL 4 MG/2ML IJ SOLN: 4.0000 mg | Freq: Three times a day (TID) | INTRAMUSCULAR | Status: DC | PRN

## 2018-04-07 MED ORDER — TRAZODONE HCL 50 MG PO TABS
50.0000 mg | ORAL_TABLET | Freq: Every day | ORAL | Status: DC
  Administered 2018-04-07 – 2018-04-08 (×2): 50 mg via ORAL
  Filled 2018-04-07 (×2): qty 1

## 2018-04-07 MED ORDER — ALBUTEROL SULFATE (2.5 MG/3ML) 0.083% IN NEBU
5.0000 mg | INHALATION_SOLUTION | Freq: Once | RESPIRATORY_TRACT | Status: AC
  Administered 2018-04-07: 5 mg via RESPIRATORY_TRACT
  Filled 2018-04-07: qty 6

## 2018-04-07 MED ORDER — AZITHROMYCIN 250 MG PO TABS
250.0000 mg | ORAL_TABLET | Freq: Every day | ORAL | Status: DC
  Administered 2018-04-07: 250 mg via ORAL
  Filled 2018-04-07: qty 1

## 2018-04-07 MED ORDER — METOPROLOL TARTRATE 50 MG PO TABS
50.0000 mg | ORAL_TABLET | Freq: Two times a day (BID) | ORAL | Status: DC
  Administered 2018-04-07 – 2018-04-09 (×5): 50 mg via ORAL
  Filled 2018-04-07 (×5): qty 1

## 2018-04-07 MED ORDER — ACETAMINOPHEN 325 MG PO TABS: 650.0000 mg | ORAL_TABLET | Freq: Four times a day (QID) | ORAL | Status: DC | PRN

## 2018-04-07 MED ORDER — IPRATROPIUM-ALBUTEROL 0.5-2.5 (3) MG/3ML IN SOLN
3.0000 mL | Freq: Three times a day (TID) | RESPIRATORY_TRACT | Status: DC
  Administered 2018-04-07 – 2018-04-09 (×6): 3 mL via RESPIRATORY_TRACT
  Filled 2018-04-07 (×5): qty 3

## 2018-04-07 MED ORDER — ASPIRIN EC 81 MG PO TBEC
81.0000 mg | DELAYED_RELEASE_TABLET | Freq: Every day | ORAL | Status: DC
  Administered 2018-04-07 – 2018-04-09 (×3): 81 mg via ORAL
  Filled 2018-04-07 (×3): qty 1

## 2018-04-07 MED ORDER — METHYLPREDNISOLONE SODIUM SUCC 125 MG IJ SOLR
60.0000 mg | Freq: Three times a day (TID) | INTRAMUSCULAR | Status: DC
  Administered 2018-04-07 – 2018-04-09 (×7): 60 mg via INTRAVENOUS
  Filled 2018-04-07 (×6): qty 2

## 2018-04-07 MED ORDER — AMLODIPINE BESYLATE 10 MG PO TABS
10.0000 mg | ORAL_TABLET | Freq: Every day | ORAL | Status: DC
  Administered 2018-04-07 – 2018-04-09 (×3): 10 mg via ORAL
  Filled 2018-04-07 (×3): qty 1

## 2018-04-07 MED ORDER — GUAIFENESIN ER 600 MG PO TB12
600.0000 mg | ORAL_TABLET | Freq: Two times a day (BID) | ORAL | Status: DC
  Administered 2018-04-07 – 2018-04-09 (×5): 600 mg via ORAL
  Filled 2018-04-07 (×6): qty 1

## 2018-04-07 NOTE — ED Provider Notes (Signed)
[facility] EMERGENCY DEPARTMENT Provider Note  CSN: [id number] Arrival date & time: 04/07/18 0101  Chief Complaint(s) Shortness of Breath  HPI Ian [name] is a 82 y.o. male with a history listed below including COPD not on supplement oxygen who presents to the emergency department with approximately 10 days of nonproductive cough.  Patient reports that he was at the beach couple weeks ago and developed a cough.  He denies any associated fevers.  Cough is wet sounding but again not productive.  Denies any associated chest pain.  He does endorse shortness of breath with the coughing.  No abdominal pain, nausea, vomiting.  Denies any extremity swelling.  HPI  Past Medical History Past Medical History:  Diagnosis Date  . Allergy   . Alzheimer's disease ([place])   . Anemia, unspecified   . Anxiety   . Chronic maxillary sinusitis   . COPD (chronic obstructive pulmonary disease) ([place])   . Depression   . Diabetes mellitus   . Edema   . Hypercalcemia   . Hyperlipidemia   . Hypertension   . Hypertrophy of prostate without urinary obstruction and other lower urinary tract symptoms (LUTS)   . Hypertrophy of prostate without urinary obstruction and other lower urinary tract symptoms (LUTS)   . Kidney disease    mild  . Unspecified disorder of kidney and ureter   . Unspecified sleep apnea   . Ventral hernia, unspecified, without mention of obstruction or gangrene    Patient Active Problem List   Diagnosis Date Noted  . Acute on chronic respiratory failure with hypoxia ([place]) 04/07/2018  . COPD with acute exacerbation ([place]) 04/07/2018  . Chronic diastolic CHF (congestive heart failure) ([place]) 04/07/2018  . CKD (chronic kidney disease), stage III ([place]) 04/07/2018  . COPD exacerbation ([place]) 04/07/2018  . Cough 11/06/2017  . Chronic right-sided low back pain with right-sided sciatica 10/06/2017  . SBO (small bowel obstruction) ([place]) 12/31/2016  . Early onset Alzheimer's  dementia without behavioral disturbance ([place]) 11/04/2016  . Chronic kidney disease (CKD) stage G3a/A2, moderately decreased glomerular filtration rate (GFR) between 45-59 mL/min/1.73 square meter and albuminuria creatinine ratio between 30-299 mg/g (HCC) 11/23/2015  . Loss of weight 11/23/2015  . Acute upper respiratory infection 07/11/2015  . Cataract, nuclear 08/26/2014  . Obesity (BMI 30-39.9) 02/03/2014  . Chronic kidney disease (CKD) 10/20/2013  . Hyperlipidemia associated with type 2 diabetes mellitus ([place]) 09/17/2013  . Cellulitis of left hand 02/12/2013  . Edema of hand 01/07/2013  . Depression 01/07/2013  . Insomnia 01/07/2013  . Fluid overload 01/02/2013  . UTI (lower urinary tract infection) 12/29/2012  . Type II diabetes mellitus with renal manifestations ([place]) 12/29/2012  . Anxiety   . Hypertension associated with diabetes ([place])   . Hyperlipidemia   . Degenerative drusen 07/07/2012  . COPD with asthma ([place]) 12/03/2011  . Seasonal allergic rhinitis 12/03/2011  . Branch retinal vein occlusion 06/28/2011  . 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   Home Medication(s) Prior to Admission medications   Medication Sig Start Date End Date Taking? Authorizing Provider  albuterol (PROVENTIL HFA;VENTOLIN HFA) 108 (90 Base) MCG/ACT inhaler Inhale 2 puffs into the lungs every 6 (six) hours as needed for wheezing or shortness of breath. 12/16/17   [name], [name] L, DO  amLODipine (NORVASC) 10 MG tablet Take 1 tablet (10 mg total) by mouth daily. 12/16/17   [name], [name] L, DO  aspirin EC 81 MG tablet Take 1 tablet (81 mg total) by mouth daily. 12/16/17   Hollace Kinnier  L, DO  atorvastatin (LIPITOR) 20 MG tablet Take one tablet by mouth once daily for cholesterol 12/16/17   [name], [name] L, DO  budesonide-formoterol (SYMBICORT) 160-4.5 MCG/ACT inhaler Inhale 2 puffs into the lungs 2 (two) times daily. 02/19/18   Chesley Mires, MD  doxycycline (VIBRA-TABS) 100 MG tablet Take 1 tablet (100 mg total) by mouth  2 (two) times daily. 04/06/18   Lauree Chandler, NP  glucose blood (ONE TOUCH ULTRA TEST) test strip USE AS INSTRUCTED TO TEST BLOOD SUGAR ONCE DAILY DX E119 02/17/18   [name], [name] L, DO  hydrALAZINE (APRESOLINE) 25 MG tablet Take 1 tablet (25 mg total) by mouth 2 (two) times daily. 12/16/17   [name], [name] L, DO  ipratropium-albuterol (DUONEB) 0.5-2.5 (3) MG/3ML SOLN Take 3 mLs by nebulization every 6 (six) hours as needed (wheezing). 12/16/17   [name], [name] L, DO  Lancet Devices (ONE TOUCH DELICA LANCING DEV) MISC Use to test blood sugar once daily dx: E119 02/06/18   Mariea Clonts, [name] L, DO  Lancets [facility] ULTRASOFT) lancets Use to test blood sugar up to three times daily. Dx E11.9 12/16/17   [name], [name] L, DO  losartan (COZAAR) 25 MG tablet Take 1 tablet (25 mg total) by mouth daily. 12/16/17   [name], [name] L, DO  metoprolol tartrate (LOPRESSOR) 50 MG tablet Take 1 tablet (50 mg total) by mouth 2 (two) times daily. 12/16/17   [name], [name] L, DO  montelukast (SINGULAIR) 10 MG tablet Take 1 tablet (10 mg total) by mouth at bedtime. 12/16/17   [name], [name] L, DO  PARoxetine (PAXIL) 20 MG tablet Take 1 tablet by mouth once daily for depression 12/16/17   [name], [name] L, DO  polyethylene glycol powder (GLYCOLAX/MIRALAX) powder Take 17 g by mouth every 3 (three) days. For constipation; hold for loose stools 12/16/17   [name], [name] L, DO  predniSONE (DELTASONE) 20 MG tablet Take 1 tablet (20 mg total) by mouth daily with breakfast. 04/06/18   Lauree Chandler, NP  sitaGLIPtin (JANUVIA) 25 MG tablet Take 1 tablet (25 mg total) by mouth daily. 12/24/17   [name], [name] L, DO  traMADol (ULTRAM) 50 MG tablet TAKE 1 TABLET BY MOUTH TWICE A DAY 12/24/17   Garald Balding, MD  traZODone (DESYREL) 50 MG tablet Take 1 tablet (50 mg total) by mouth at bedtime. 12/16/17   Gayland Curry, DO                                                                                                                                     Past Surgical History Past Surgical History:  Procedure Laterality Date  . EXPLORATORY LAPAROTOMY  2002  . HERNIA REPAIR     Family History Family History  Problem Relation Age of Onset  . Pancreatic cancer Sister   . Heart disease Father   . Hypertension Brother   . Hypertension Sister  Social History Social History   Tobacco Use  . Smoking status: Former Smoker    Packs/day: 1.50    Years: 30.00    Pack years: 45.00    Types: Cigarettes    Last attempt to quit: 07/08/1988    Years since quitting: 29.7  . Smokeless tobacco: Never Used  Substance Use Topics  . Alcohol use: No  . Drug use: No   Allergies Ace inhibitors  Review of Systems Review of Systems All other systems are reviewed and are negative for acute change except as noted in the HPI  Physical Exam Vital Signs  I have reviewed the triage vital signs BP (!) 172/76   Pulse 64   Temp 98.1 F (36.7 C) (Oral)   Resp (!) 23   SpO2 87%  RA  Physical Exam  Constitutional: He is oriented to person, place, and time. He appears well-developed and well-nourished. No distress.  HENT:  Head: Normocephalic and atraumatic.  Nose: Nose normal.  Eyes: Pupils are equal, round, and reactive to light. Conjunctivae and EOM are normal. Right eye exhibits no discharge. Left eye exhibits no discharge. No scleral icterus.  Neck: Normal range of motion. Neck supple.  Cardiovascular: Normal rate and regular rhythm. Exam reveals no gallop and no friction rub.  No murmur heard. Pulmonary/Chest: Effort normal. No stridor. Tachypnea noted. No respiratory distress. He has rhonchi (diffuse). He has no rales.  Abdominal: Soft. He exhibits no distension. There is no tenderness.  Musculoskeletal: He exhibits no edema or tenderness.  Neurological: He is alert and oriented to person, place, and time.  Skin: Skin is warm and dry. No rash noted. He is not diaphoretic. No erythema.  Psychiatric: He has a normal mood and affect.    Vitals reviewed.   ED Results and Treatments Labs (all labs ordered are listed, but only abnormal results are displayed) Labs Reviewed  CBC - Abnormal; Notable for the following components:      Result Value   WBC 13.0 (*)    RBC 4.13 (*)    Platelets 143 (*)    All other components within normal limits  BASIC METABOLIC PANEL - Abnormal; Notable for the following components:   Glucose, Bld 165 (*)    BUN 44 (*)    Creatinine, Ser 2.20 (*)    GFR calc non Af Amer 27 (*)    GFR calc Af Amer 31 (*)    All other components within normal limits  CULTURE, BLOOD (ROUTINE X 2)  CULTURE, BLOOD (ROUTINE X 2)  RESPIRATORY PANEL BY PCR  BRAIN NATRIURETIC PEPTIDE  LACTIC ACID, PLASMA  LACTIC ACID, PLASMA  PROCALCITONIN  TROPONIN I  I-STAT CG4 LACTIC ACID, ED                                                                                                                         EKG  EKG Interpretation  Date/Time:  Tuesday April 07 2018 01:11:12 EDT Ventricular Rate:  70  PR Interval:  174 QRS Duration: 106 QT Interval:  426 QTC Calculation: 460 R Axis:   72 Text Interpretation:  Normal sinus rhythm Normal ECG Artifact Otherwise no significant change Confirmed by Addison Lank [phone]) on 04/07/2018 2:34:11 AM      Radiology Dg Chest 2 View  Result Date: 04/07/2018 CLINICAL DATA:  Shortness of breath, cough EXAM: CHEST - 2 VIEW COMPARISON:  10/20/2017 FINDINGS: Lungs are clear.  No pleural effusion or pneumothorax. Cardiomegaly. Visualized osseous structures are within normal limits. IMPRESSION: No evidence of acute cardiopulmonary disease. Electronically Signed   By: Julian Hy M.D.   On: 04/07/2018 01:47   Pertinent labs & imaging results that were available during my care of the patient were reviewed by me and considered in my medical decision making (see chart for details).  Medications Ordered in ED Medications  cefTRIAXone (ROCEPHIN) 1 g in sodium chloride 0.9 %  100 mL IVPB (has no administration in time range)  azithromycin (ZITHROMAX) 500 mg in sodium chloride 0.9 % 250 mL IVPB (has no administration in time range)  azithromycin (ZITHROMAX) tablet 250 mg (has no administration in time range)  ipratropium-albuterol (DUONEB) 0.5-2.5 (3) MG/3ML nebulizer solution 3 mL (3 mLs Nebulization Given 04/07/18 0329)  albuterol (PROVENTIL) (2.5 MG/3ML) 0.083% nebulizer solution 2.5 mg (has no administration in time range)  methylPREDNISolone sodium succinate (SOLU-MEDROL) 125 mg/2 mL injection 60 mg (has no administration in time range)  dextromethorphan-guaiFENesin (MUCINEX DM) 30-600 MG per 12 hr tablet 1 tablet (has no administration in time range)  heparin injection 5,000 Units (has no administration in time range)  insulin aspart (novoLOG) injection 0-9 Units (has no administration in time range)  insulin aspart (novoLOG) injection 0-5 Units (has no administration in time range)  hydrALAZINE (APRESOLINE) injection 5 mg (has no administration in time range)  ondansetron (ZOFRAN) injection 4 mg (has no administration in time range)  acetaminophen (TYLENOL) tablet 650 mg (has no administration in time range)  zolpidem (AMBIEN) tablet 5 mg (has no administration in time range)  albuterol (PROVENTIL) (2.5 MG/3ML) 0.083% nebulizer solution 5 mg (5 mg Nebulization Given 04/07/18 0114)  albuterol (PROVENTIL) (2.5 MG/3ML) 0.083% nebulizer solution 5 mg (5 mg Nebulization Given 04/07/18 0329)  0.9 %  sodium chloride infusion ( Intravenous New Bag/Given 04/07/18 0335)                                                                                                                                    Procedures Procedures CRITICAL CARE Performed by: Grayce Sessions Zaccary Creech Total critical care time: 35 minutes Critical care time was exclusive of separately billable procedures and treating other patients. Critical care was necessary to treat or prevent imminent or  life-threatening deterioration. Critical care was time spent personally by me on the following activities: development of treatment plan with patient and/or surrogate as well as nursing, discussions with consultants, evaluation of patient's response to treatment, examination  of patient, obtaining history from patient or surrogate, ordering and performing treatments and interventions, ordering and review of laboratory studies, ordering and review of radiographic studies, pulse oximetry and re-evaluation of patient's condition.   (including critical care time)  Medical Decision Making / ED Course I have reviewed the nursing notes for this encounter and the patient's prior records (if available in EHR or on provided paperwork).    Patient afebrile but noted to be hypoxic on room air with saturations in the 80s.  Placed on supplemental oxygen.  Lungs with diffuse rhonchi and deep crackles.  Chest x-ray without evidence of focal pneumonia.  However started on antibiotics for COPD exacerbation in the setting of possible pneumonia.  Also given breathing treatments.  Admitted to the hospital for continued work-up and management.  Final Clinical Impression(s) / ED Diagnoses Final diagnoses:  COPD exacerbation ([place])  Hypoxia      This chart was dictated using voice recognition software.  Despite best efforts to proofread,  errors can occur which can change the documentation meaning.   Fatima Blank, MD 04/07/18 [phone]

## 2018-04-07 NOTE — Progress Notes (Signed)
[facility] PLAN OF CARE NOTE Patient: Ian [name] RAX:[id number]   PCP: Gayland Curry, DO DOB: Apr 30, 1936   DOA: 04/07/2018   DOS: 04/07/2018    Patient was admitted by my colleague Dr. Blaine Hamper earlier on 04/07/2018. I have reviewed the H&P as well as assessment and plan and agree with the same. Important changes in the plan are listed below.  Plan of care: Principal Problem:   Acute on chronic respiratory failure with hypoxia (HCC) Active Problems:   Type II diabetes mellitus with renal manifestations (HCC)   Depression   Chronic diastolic CHF (congestive heart failure) (HCC)   CKD (chronic kidney disease), stage III (HCC)   COPD exacerbation (HCC)   HLD (hyperlipidemia)   HTN (hypertension) Rhinovirus bronchiolitis. Continue steroids, discontinue antibiotics.  Add Mucinex as scheduled and Delsym scheduled. Hypoxic needing 4 L.  Author: Berle Mull, MD [facility] Pager: [phone] 04/07/2018 2:53 PM   If 7PM-7AM, please contact night-coverage at [email or web address], password [facility]

## 2018-04-07 NOTE — Progress Notes (Signed)
Nutrition Brief Note  RD consulted per COPD Exacerbation Order Set.  Wt Readings from Last 15 Encounters:  04/07/18 88.4 kg  04/06/18 90.3 kg  03/05/18 88.8 kg  01/22/18 88.9 kg  12/31/17 87.6 kg  12/16/17 86.6 kg  11/06/17 85.2 kg  10/20/17 85.3 kg  10/14/17 84.8 kg  10/06/17 84.8 kg  10/01/17 84.7 kg  09/22/17 87.5 kg  09/11/17 87.5 kg  09/08/17 88 kg  09/01/17 86.6 kg   Body mass index is 30.52 kg/m. Patient meets criteria for Obesity Class I based on current BMI.   Current diet order is Heart Healthy/Carbohydrate Modified. Reports a good appetite. Enjoyed his breakfast.   No nutrition interventions warranted at this time. Labs and medications reviewed.   If nutrition issues arise, please consult RD.   Arthur Holms, RD, LDN Pager #: [phone] After-Hours Pager #: [phone]

## 2018-04-07 NOTE — ED Triage Notes (Signed)
Pt here with increased shortness of breath.  Hx of COPD states he feels congested and hard to breathe.  Wheezing in all lung fields.  Treated with albuterol in triage. A&Ox4, Afebrile.

## 2018-04-07 NOTE — ED Notes (Signed)
Report given to Erin RN

## 2018-04-07 NOTE — ED Notes (Signed)
Saturations dropped back to 85% on room air after treatment.  O2 applied at 3Lpm.

## 2018-04-07 NOTE — Consult Note (Signed)
   [facility] CM Inpatient Consult   04/07/2018  Eriq Hufford [birth year] [id number]  Call received from [place] Management Coordinator toatient has recently been referred to [place] [facility] for chronic disease management services.  [place] Coordinator has made out reach attempts to patient's son Catalina [place].  Patient admitted under observation currently with shortness of breath with acute respiratory failure with hypoxia. Patient has a HX but not limited to Dementia, HF, CKD3 and Diabetes Mellitus. Will follow up with inpatient RNCM to make aware of new referral to [facility].  Patient has [employer] plan. Went by to see patient and family but no support family at the bedside.  Reviewed chart and will follow and follow up with inpatient care manager that [facility] will be following.  Of note, [facility] Care Management services does not replace or interfere with any services that are needed or arranged by inpatient case management or social work.  For additional questions or referrals please contact:   Natividad Brood, RN BSN [facility] Liaison  [phone] business mobile phone Toll free office [phone]

## 2018-04-07 NOTE — Care Management Note (Signed)
Case Management Note  Patient Details  Name: Ian [name] MRN: [id number] Date of Birth: 01-30-1936  Subjective/Objective:    Presents with SOB/Acute on chronic respiratory failure with hypoxia. Hx of hypertension, hyperlipidemia, diabetes mellitus, COPD, dementia, BPH, OSA ,CHF, CKD 3, depression, anxiety. Lives alone. PTA independent with ADL's, no DME usage.           PCP: Fanny Dance, Pulmonolgist- Dr. Tiana Loft [place] (Son)  [name] (son)   [phone]  [phone]     Action/Plan: Transition to home when medically stable.Marland KitchenMarland KitchenNCM following for transition of care needs.  Expected Discharge Date:                  Expected Discharge Plan:  Home/Self Care  In-House Referral:     Discharge planning Services  CM Consult  Post Acute Care Choice:    Choice offered to:     DME Arranged:    DME Agency:     HH Arranged:    HH Agency:     Status of Service:  In process, will continue to follow  If discussed at Long Length of Stay Meetings, dates discussed:    Additional Comments:  Sharin Mons, RN 04/07/2018, 11:14 AM

## 2018-04-07 NOTE — H&P (Signed)
History and Physical    Lopaka Karge YFV:[id number] DOB: 12/02/1935 DOA: 04/07/2018  Referring MD/NP/PA:   PCP: Gayland Curry, DO   Patient coming from:  The patient is coming from home.  At baseline, pt is independent for most of ADL.   Chief Complaint: Cough, shortness breath  HPI: Ian [name] is a 82 y.o. male with medical history significant of hypertension, hyperlipidemia, diabetes mellitus, COPD, dementia, BPH, OSA not on CPAP, dCHF, CKD 3, depression, anxiety, who presents with cough, shortness of breath.  Patient states that he has been Having cough, shortness of breath for more than 10 days, which has worsened in the past several days.  He has chest tightness intermittently, currently no chest pain.  He coughs up white-colored mucus, no fever or chills.  Patient was found to have oxygen desaturation to 85% on room air in ED.  No nausea, vomiting, diarrhea or abdominal pain.  No symptoms of UTI or unilateral weakness.  No leg edema.  ED Course: pt was found to have WBC 13.1, renal function close to baseline, temperature normal, no tachycardia, has tachypnea, chest x-ray negative for infiltration.  Patient is placed on MedSurg bed for observation.  Review of Systems:   General: no fevers, chills, has body weight gain, has fatigue HEENT: no blurry vision, hearing changes or sore throat Respiratory: has dyspnea, coughing, wheezing CV: no chest pain, no palpitations. Has chest tightness GI: no nausea, vomiting, abdominal pain, diarrhea, constipation GU: no dysuria, burning on urination, increased urinary frequency, hematuria  Ext: no leg edema Neuro: no unilateral weakness, numbness, or tingling, no vision change or hearing loss Skin: no rash, no skin tear. MSK: No muscle spasm, no deformity, no limitation of range of movement in spin Heme: No easy bruising.  Travel history: No recent long distant travel.  Allergy:  Allergies  Allergen Reactions  . Ace Inhibitors Cough     Past Medical History:  Diagnosis Date  . Allergy   . Alzheimer's disease ([place])   . Anemia, unspecified   . Anxiety   . Chronic maxillary sinusitis   . COPD (chronic obstructive pulmonary disease) ([place])   . Depression   . Diabetes mellitus   . Edema   . Hypercalcemia   . Hyperlipidemia   . Hypertension   . Hypertrophy of prostate without urinary obstruction and other lower urinary tract symptoms (LUTS)   . Hypertrophy of prostate without urinary obstruction and other lower urinary tract symptoms (LUTS)   . Kidney disease    mild  . Unspecified disorder of kidney and ureter   . Unspecified sleep apnea   . Ventral hernia, unspecified, without mention of obstruction or gangrene     Past Surgical History:  Procedure Laterality Date  . EXPLORATORY LAPAROTOMY  2002  . HERNIA REPAIR      Social History:  reports that he quit smoking about 29 years ago. His smoking use included cigarettes. He has a 45.00 pack-year smoking history. He has never used smokeless tobacco. He reports that he does not drink alcohol or use drugs.  Family History:  Family History  Problem Relation Age of Onset  . Pancreatic cancer Sister   . Heart disease Father   . Hypertension Brother   . Hypertension Sister      Prior to Admission medications   Medication Sig Start Date End Date Taking? Authorizing Provider  albuterol (PROVENTIL HFA;VENTOLIN HFA) 108 (90 Base) MCG/ACT inhaler Inhale 2 puffs into the lungs every 6 (six) hours as needed  for wheezing or shortness of breath. 12/16/17   [name], [name] L, DO  amLODipine (NORVASC) 10 MG tablet Take 1 tablet (10 mg total) by mouth daily. 12/16/17   [name], [name] L, DO  aspirin EC 81 MG tablet Take 1 tablet (81 mg total) by mouth daily. 12/16/17   [name], [name] L, DO  atorvastatin (LIPITOR) 20 MG tablet Take one tablet by mouth once daily for cholesterol 12/16/17   [name], [name] L, DO  budesonide-formoterol (SYMBICORT) 160-4.5 MCG/ACT inhaler Inhale 2 puffs  into the lungs 2 (two) times daily. 02/19/18   Chesley Mires, MD  doxycycline (VIBRA-TABS) 100 MG tablet Take 1 tablet (100 mg total) by mouth 2 (two) times daily. 04/06/18   Lauree Chandler, NP  glucose blood (ONE TOUCH ULTRA TEST) test strip USE AS INSTRUCTED TO TEST BLOOD SUGAR ONCE DAILY DX E119 02/17/18   [name], [name] L, DO  hydrALAZINE (APRESOLINE) 25 MG tablet Take 1 tablet (25 mg total) by mouth 2 (two) times daily. 12/16/17   [name], [name] L, DO  ipratropium-albuterol (DUONEB) 0.5-2.5 (3) MG/3ML SOLN Take 3 mLs by nebulization every 6 (six) hours as needed (wheezing). 12/16/17   [name], [name] L, DO  Lancet Devices (ONE TOUCH DELICA LANCING DEV) MISC Use to test blood sugar once daily dx: E119 02/06/18   Mariea Clonts, [name] L, DO  Lancets [facility] ULTRASOFT) lancets Use to test blood sugar up to three times daily. Dx E11.9 12/16/17   [name], [name] L, DO  losartan (COZAAR) 25 MG tablet Take 1 tablet (25 mg total) by mouth daily. 12/16/17   [name], [name] L, DO  metoprolol tartrate (LOPRESSOR) 50 MG tablet Take 1 tablet (50 mg total) by mouth 2 (two) times daily. 12/16/17   [name], [name] L, DO  montelukast (SINGULAIR) 10 MG tablet Take 1 tablet (10 mg total) by mouth at bedtime. 12/16/17   [name], [name] L, DO  PARoxetine (PAXIL) 20 MG tablet Take 1 tablet by mouth once daily for depression 12/16/17   [name], [name] L, DO  polyethylene glycol powder (GLYCOLAX/MIRALAX) powder Take 17 g by mouth every 3 (three) days. For constipation; hold for loose stools 12/16/17   [name], [name] L, DO  predniSONE (DELTASONE) 20 MG tablet Take 1 tablet (20 mg total) by mouth daily with breakfast. 04/06/18   Lauree Chandler, NP  sitaGLIPtin (JANUVIA) 25 MG tablet Take 1 tablet (25 mg total) by mouth daily. 12/24/17   [name], [name] L, DO  traMADol (ULTRAM) 50 MG tablet TAKE 1 TABLET BY MOUTH TWICE A DAY 12/24/17   Garald Balding, MD  traZODone (DESYREL) 50 MG tablet Take 1 tablet (50 mg total) by mouth at bedtime. 12/16/17    Gayland Curry, DO    Physical Exam: Vitals:   04/07/18 0329 04/07/18 0338 04/07/18 0400 04/07/18 0445  BP:  (!) 162/67 (!) 152/60 (!) 159/70  Pulse:  (!) 59 65 66  Resp:  (!) 26 (!) 32 18  Temp:    98.4 F (36.9 C)  TempSrc:    Oral  SpO2: 94% 96% 93% 95%  Weight:    88.4 kg  Height:    5\' 7"  (1.702 m)   General: Not in acute distress HEENT:       Eyes: PERRL, EOMI, no scleral icterus.       ENT: No discharge from the ears and nose, no pharynx injection, no tonsillar enlargement.        Neck: No JVD, no bruit, no mass felt. Heme: No  neck lymph node enlargement. Cardiac: S1/S2, RRR, No murmurs, No gallops or rubs. Respiratory: Has rhonchi and wheezing bilaterally  GI: Soft, nondistended, nontender, no rebound pain, no organomegaly, BS present. GU: No hematuria Ext: No pitting leg edema bilaterally. 2+DP/PT pulse bilaterally. Musculoskeletal: No joint deformities, No joint redness or warmth, no limitation of ROM in spin. Skin: No rashes.  Neuro: Alert, oriented X3, cranial nerves II-XII grossly intact, moves all extremities normally. Psych: Patient is not psychotic, no suicidal or hemocidal ideation.  Labs on Admission: I have personally reviewed following labs and imaging studies  CBC: Recent Labs  Lab 04/07/18 0114  WBC 13.0*  HGB 13.4  HCT 41.0  MCV 99.3  PLT 379*   Basic Metabolic Panel: Recent Labs  Lab 04/07/18 0114  NA 143  K 4.3  CL 105  CO2 28  GLUCOSE 165*  BUN 44*  CREATININE 2.20*  CALCIUM 10.2   GFR: Estimated Creatinine Clearance: 28.4 mL/min (A) (by C-G formula based on SCr of 2.2 mg/dL (H)). Liver Function Tests: No results for input(s): AST, ALT, ALKPHOS, BILITOT, PROT, ALBUMIN in the last 168 hours. No results for input(s): LIPASE, AMYLASE in the last 168 hours. No results for input(s): AMMONIA in the last 168 hours. Coagulation Profile: No results for input(s): INR, PROTIME in the last 168 hours. Cardiac Enzymes: Recent Labs  Lab  04/07/18 0313  TROPONINI <0.03   BNP (last 3 results) No results for input(s): PROBNP in the last 8760 hours. HbA1C: No results for input(s): HGBA1C in the last 72 hours. CBG: No results for input(s): GLUCAP in the last 168 hours. Lipid Profile: No results for input(s): CHOL, HDL, LDLCALC, TRIG, CHOLHDL, LDLDIRECT in the last 72 hours. Thyroid Function Tests: No results for input(s): TSH, T4TOTAL, FREET4, T3FREE, THYROIDAB in the last 72 hours. Anemia Panel: No results for input(s): VITAMINB12, FOLATE, FERRITIN, TIBC, IRON, RETICCTPCT in the last 72 hours. Urine analysis:    Component Value Date/Time   COLORURINE YELLOW 12/29/2012 [place] 12/29/2012 1650   LABSPEC 1.025 12/29/2012 1650   PHURINE 5.5 12/29/2012 1650   GLUCOSEU NEGATIVE 12/29/2012 1650   HGBUR MODERATE (A) 12/29/2012 1650   BILIRUBINUR SMALL (A) 12/29/2012 1650   KETONESUR NEGATIVE 12/29/2012 1650   PROTEINUR >300 (A) 12/29/2012 1650   UROBILINOGEN 1.0 12/29/2012 1650   NITRITE NEGATIVE 12/29/2012 1650   LEUKOCYTESUR NEGATIVE 12/29/2012 1650   Sepsis Labs: @LABRCNTIP (procalcitonin:4,lacticidven:4) )No results found for this or any previous visit (from the past 240 hour(s)).   Radiological Exams on Admission: Dg Chest 2 View  Result Date: 04/07/2018 CLINICAL DATA:  Shortness of breath, cough EXAM: CHEST - 2 VIEW COMPARISON:  10/20/2017 FINDINGS: Lungs are clear.  No pleural effusion or pneumothorax. Cardiomegaly. Visualized osseous structures are within normal limits. IMPRESSION: No evidence of acute cardiopulmonary disease. Electronically Signed   By: Julian Hy M.D.   On: 04/07/2018 01:47     EKG: Independently reviewed.  Sinus rhythm, QTC 460, anteroseptal infarction pattern.  Nonspecific T wave change.  Assessment/Plan Principal Problem:   Acute on chronic respiratory failure with hypoxia (HCC) Active Problems:   Type II diabetes mellitus with renal manifestations (HCC)    Depression   Chronic diastolic CHF (congestive heart failure) (HCC)   CKD (chronic kidney disease), stage III (HCC)   COPD exacerbation (HCC)   HLD (hyperlipidemia)   HTN (hypertension)   Acute on chronic respiratory failure with hypoxia due to COPD exacerbation: Chest x-ray negative for infiltration. -will place on  tele bed for obs -Nebulizers: scheduled Duoneb and prn albuterol -Dulera inhaler, Singulair -Solu-Medrol 60 mg IV tid -Azithromycin (patient received 1 dose of Rocephin in ED) -Mucinex for cough  -Incentive spirometry -Follow up blood culture x2, sputum culture, respiratory virus panel -Nasal cannula oxygen as needed to maintain O2 saturation 92% or greater  Type II diabetes mellitus with renal manifestations ([place]): Last A1c 6.2 on 01/29/18, well controled. Patient is taking Januvia at home -SSI  Chronic diastolic CHF: 2D echo on 7/54/3606 showed EF 60-65% with grade 1 diastolic dysfunction.  Patient does not have leg edema or JVD.  No pulmonary edema chest x-ray.  CHF is compensated. - Check BNP -Continue aspirin, metoprolol  HLD: -lipitor  HTN:  -Continue home medications: Metoprolol, amlodipine, hydralazine, Cozaar, -IV hydralazine prn   Depression: Stable, no suicidal or homicidal ideations. -Continue home medications: Paxil  CKD (chronic kidney disease), stage III ([place]): Close to baseline.  Baseline creatinine 1.7- 2.2.  His creatinine to 2.20, BUN 44. -Follow-up renal function by BMP    DVT ppx: SQ Heparin   Code Status: Full code Family Communication: None at bed side.    Disposition Plan:  Anticipate discharge back to previous home environment Consults called:  none Admission status: medical floor/obs      Date of Service 04/07/2018    Ivor Costa [facility] Pager [phone]  If 7PM-7AM, please contact night-coverage [email or web address] Password [email or web address] 04/07/2018, 5:10 AM

## 2018-04-08 DIAGNOSIS — Z888 Allergy status to other drugs, medicaments and biological substances status: Secondary | ICD-10-CM | POA: Diagnosis not present

## 2018-04-08 DIAGNOSIS — F419 Anxiety disorder, unspecified: Secondary | ICD-10-CM | POA: Diagnosis present

## 2018-04-08 DIAGNOSIS — Z800 Family history of malignant neoplasm of digestive organs: Secondary | ICD-10-CM | POA: Diagnosis not present

## 2018-04-08 DIAGNOSIS — E785 Hyperlipidemia, unspecified: Secondary | ICD-10-CM | POA: Diagnosis present

## 2018-04-08 DIAGNOSIS — G4733 Obstructive sleep apnea (adult) (pediatric): Secondary | ICD-10-CM | POA: Diagnosis present

## 2018-04-08 DIAGNOSIS — E1122 Type 2 diabetes mellitus with diabetic chronic kidney disease: Secondary | ICD-10-CM | POA: Diagnosis present

## 2018-04-08 DIAGNOSIS — B9789 Other viral agents as the cause of diseases classified elsewhere: Secondary | ICD-10-CM | POA: Diagnosis present

## 2018-04-08 DIAGNOSIS — Z7984 Long term (current) use of oral hypoglycemic drugs: Secondary | ICD-10-CM | POA: Diagnosis not present

## 2018-04-08 DIAGNOSIS — R0902 Hypoxemia: Secondary | ICD-10-CM | POA: Diagnosis present

## 2018-04-08 DIAGNOSIS — Z8249 Family history of ischemic heart disease and other diseases of the circulatory system: Secondary | ICD-10-CM | POA: Diagnosis not present

## 2018-04-08 DIAGNOSIS — F329 Major depressive disorder, single episode, unspecified: Secondary | ICD-10-CM | POA: Diagnosis present

## 2018-04-08 DIAGNOSIS — Z7951 Long term (current) use of inhaled steroids: Secondary | ICD-10-CM | POA: Diagnosis not present

## 2018-04-08 DIAGNOSIS — I5032 Chronic diastolic (congestive) heart failure: Secondary | ICD-10-CM | POA: Diagnosis present

## 2018-04-08 DIAGNOSIS — Z79891 Long term (current) use of opiate analgesic: Secondary | ICD-10-CM | POA: Diagnosis not present

## 2018-04-08 DIAGNOSIS — F0280 Dementia in other diseases classified elsewhere without behavioral disturbance: Secondary | ICD-10-CM | POA: Diagnosis present

## 2018-04-08 DIAGNOSIS — Z7982 Long term (current) use of aspirin: Secondary | ICD-10-CM | POA: Diagnosis not present

## 2018-04-08 DIAGNOSIS — J441 Chronic obstructive pulmonary disease with (acute) exacerbation: Secondary | ICD-10-CM | POA: Diagnosis present

## 2018-04-08 DIAGNOSIS — J320 Chronic maxillary sinusitis: Secondary | ICD-10-CM | POA: Diagnosis present

## 2018-04-08 DIAGNOSIS — I130 Hypertensive heart and chronic kidney disease with heart failure and stage 1 through stage 4 chronic kidney disease, or unspecified chronic kidney disease: Secondary | ICD-10-CM | POA: Diagnosis present

## 2018-04-08 DIAGNOSIS — N400 Enlarged prostate without lower urinary tract symptoms: Secondary | ICD-10-CM | POA: Diagnosis present

## 2018-04-08 DIAGNOSIS — Z87891 Personal history of nicotine dependence: Secondary | ICD-10-CM | POA: Diagnosis not present

## 2018-04-08 DIAGNOSIS — G309 Alzheimer's disease, unspecified: Secondary | ICD-10-CM | POA: Diagnosis present

## 2018-04-08 DIAGNOSIS — J440 Chronic obstructive pulmonary disease with acute lower respiratory infection: Secondary | ICD-10-CM | POA: Diagnosis present

## 2018-04-08 DIAGNOSIS — J9621 Acute and chronic respiratory failure with hypoxia: Secondary | ICD-10-CM | POA: Diagnosis present

## 2018-04-08 DIAGNOSIS — N183 Chronic kidney disease, stage 3 (moderate): Secondary | ICD-10-CM | POA: Diagnosis not present

## 2018-04-08 DIAGNOSIS — J218 Acute bronchiolitis due to other specified organisms: Secondary | ICD-10-CM | POA: Diagnosis present

## 2018-04-08 LAB — BASIC METABOLIC PANEL
ANION GAP: 5 (ref 5–15)
BUN: 46 mg/dL — ABNORMAL HIGH (ref 8–23)
CALCIUM: 9.3 mg/dL (ref 8.9–10.3)
CO2: 23 mmol/L (ref 22–32)
Chloride: 108 mmol/L (ref 98–111)
Creatinine, Ser: 2.38 mg/dL — ABNORMAL HIGH (ref 0.61–1.24)
GFR, EST AFRICAN AMERICAN: 28 mL/min — AB (ref >60)
GFR, EST NON AFRICAN AMERICAN: 24 mL/min — AB (ref >60)
Glucose, Bld: 213 mg/dL — ABNORMAL HIGH (ref 70–99)
Potassium: 4.7 mmol/L (ref 3.5–5.1)
Sodium: 136 mmol/L (ref 135–145)

## 2018-04-08 LAB — BLOOD CULTURE ID PANEL (REFLEXED)
ACINETOBACTER BAUMANNII: NOT DETECTED
CANDIDA ALBICANS: NOT DETECTED
CANDIDA GLABRATA: NOT DETECTED
Candida krusei: NOT DETECTED
Candida parapsilosis: NOT DETECTED
Candida tropicalis: NOT DETECTED
ENTEROBACTER CLOACAE COMPLEX: NOT DETECTED
ENTEROCOCCUS SPECIES: NOT DETECTED
Enterobacteriaceae species: NOT DETECTED
Escherichia coli: NOT DETECTED
Haemophilus influenzae: NOT DETECTED
Klebsiella oxytoca: NOT DETECTED
Klebsiella pneumoniae: NOT DETECTED
LISTERIA MONOCYTOGENES: NOT DETECTED
Methicillin resistance: DETECTED — AB
Neisseria meningitidis: NOT DETECTED
PSEUDOMONAS AERUGINOSA: NOT DETECTED
Proteus species: NOT DETECTED
STREPTOCOCCUS PNEUMONIAE: NOT DETECTED
Serratia marcescens: NOT DETECTED
Staphylococcus aureus (BCID): NOT DETECTED
Staphylococcus species: DETECTED — AB
Streptococcus agalactiae: NOT DETECTED
Streptococcus pyogenes: NOT DETECTED
Streptococcus species: NOT DETECTED

## 2018-04-08 LAB — CBC
HCT: 35.8 % — ABNORMAL LOW (ref 39.0–52.0)
HEMOGLOBIN: 11.8 g/dL — AB (ref 13.0–17.0)
MCH: 32.2 pg (ref 26.0–34.0)
MCHC: 33 g/dL (ref 30.0–36.0)
MCV: 97.8 fL (ref 78.0–100.0)
Platelets: 130 10*3/uL — ABNORMAL LOW (ref 150–400)
RBC: 3.66 MIL/uL — AB (ref 4.22–5.81)
RDW: 13.3 % (ref 11.5–15.5)
WBC: 11.6 10*3/uL — ABNORMAL HIGH (ref 4.0–10.5)

## 2018-04-08 LAB — GLUCOSE, CAPILLARY
GLUCOSE-CAPILLARY: 197 mg/dL — AB (ref 70–99)
GLUCOSE-CAPILLARY: 233 mg/dL — AB (ref 70–99)
GLUCOSE-CAPILLARY: 295 mg/dL — AB (ref 70–99)
Glucose-Capillary: 219 mg/dL — ABNORMAL HIGH (ref 70–99)

## 2018-04-08 MED ORDER — BUDESONIDE 0.25 MG/2ML IN SUSP
0.2500 mg | Freq: Two times a day (BID) | RESPIRATORY_TRACT | Status: DC
  Administered 2018-04-08 – 2018-04-09 (×2): 0.25 mg via RESPIRATORY_TRACT
  Filled 2018-04-08 (×2): qty 2

## 2018-04-08 NOTE — Progress Notes (Signed)
PHARMACY - PHYSICIAN COMMUNICATION CRITICAL VALUE ALERT - BLOOD CULTURE IDENTIFICATION (BCID)  Ian [name] is an 82 y.o. male who presented to [facility] on 04/07/2018 with a chief complaint of SOB  Assessment:  1/4 BC with staph species, methicillin resistance detected Name of physician (or Provider) Contacted: Dr Blaine Hamper Current antibiotics: none  Changes to prescribed antibiotics recommended:  No Abx presently, likely contaminant, continue with observation  Results for orders placed or performed during the hospital encounter of 04/07/18  Blood Culture ID Panel (Reflexed) (Collected: 04/07/2018  3:14 AM)  Result Value Ref Range   Enterococcus species NOT DETECTED NOT DETECTED   Listeria monocytogenes NOT DETECTED NOT DETECTED   Staphylococcus species DETECTED (A) NOT DETECTED   Staphylococcus aureus NOT DETECTED NOT DETECTED   Methicillin resistance DETECTED (A) NOT DETECTED   Streptococcus species NOT DETECTED NOT DETECTED   Streptococcus agalactiae NOT DETECTED NOT DETECTED   Streptococcus pneumoniae NOT DETECTED NOT DETECTED   Streptococcus pyogenes NOT DETECTED NOT DETECTED   Acinetobacter baumannii NOT DETECTED NOT DETECTED   Enterobacteriaceae species NOT DETECTED NOT DETECTED   Enterobacter cloacae complex NOT DETECTED NOT DETECTED   Escherichia coli NOT DETECTED NOT DETECTED   Klebsiella oxytoca NOT DETECTED NOT DETECTED   Klebsiella pneumoniae NOT DETECTED NOT DETECTED   Proteus species NOT DETECTED NOT DETECTED   Serratia marcescens NOT DETECTED NOT DETECTED   Haemophilus influenzae NOT DETECTED NOT DETECTED   Neisseria meningitidis NOT DETECTED NOT DETECTED   Pseudomonas aeruginosa NOT DETECTED NOT DETECTED   Candida albicans NOT DETECTED NOT DETECTED   Candida glabrata NOT DETECTED NOT DETECTED   Candida krusei NOT DETECTED NOT DETECTED   Candida parapsilosis NOT DETECTED NOT DETECTED   Candida tropicalis NOT DETECTED NOT DETECTED    [name] 04/08/2018   1:25 AM

## 2018-04-08 NOTE — Care Management Note (Addendum)
Case Management Note  Patient Details  Name: Ian [name] MRN: [id number] Date of Birth: 01/25/1936  Subjective/Objective:   From home alone,  Presents with SOB/Acute on chronic respiratory failure with hypoxia. Hx of hypertension, hyperlipidemia, diabetes mellitus, COPD, dementia, BPH, OSA ,CHF, CKD 3, depression, anxiety. PTA independent with ADL's, no DME usage.   NCM offered choice for COPD management, he chose [facility], referral given to [facility] with [facility]. Soc will begin 24-48 hrs post dc. He is also with [facility], per [place] .  Patient will need home oxygen also, [facility] notified.  Will need order for home oxygen.     10/3 Tomi Bamberger RN, BSN - he does not need home oxygen per his ambulatory sats today.  For dc home.                     Action/Plan: DC home when ready.   Expected Discharge Date:                  Expected Discharge Plan:  [place]  In-House Referral:     Discharge planning Services  CM Consult  Post Acute Care Choice:    Choice offered to:  Patient  DME Arranged:    DME Agency:     HH Arranged:  RN, Disease Management [place] Agency:  [place]  Status of Service:  Completed, signed off  If discussed at [place] of Stay Meetings, dates discussed:    Additional Comments:  Zenon Mayo, RN 04/08/2018, 12:43 PM

## 2018-04-08 NOTE — Progress Notes (Signed)
SATURATION QUALIFICATIONS: (This note is used to comply with regulatory documentation for home oxygen)  Patient Saturations on Room Air at Rest = 89%  Patient Saturations on Room Air while Ambulating = 85%  Patient Saturations on 3 Liters of oxygen while Ambulating = 90%  Please briefly explain why patient needs home oxygen: Patient deconditioned and having increased SOB with exertion.

## 2018-04-08 NOTE — Consult Note (Signed)
[facility] CM Inpatient Consult   04/08/2018  Naeem Lybeck 01/11/1936 [id number]   Met with the patient at the bedside.  He states he lives alone, he still drives to appointments.  Admitted with COPD exacerbation. He states he is currently using oxygen while here and doesn't know if he will need it at dishcarge.  Explained [facility] Care Management services and that he was referred for post hospital follow up.  He endorses Dr. [name] [name] as primary care provider. This office provides the transition of care calls and follow up.  He uses [facility] on [address].  He consents to [facility] Care Management follow up.  Consent form signed and copies given.  [facility] Community Care Manager will follow up.  Phone numbers in demographics confirmed for sons.  He states Mike lives in the area but both sons are equally helpful.  [facility] does not interfere with or replace any services arranged or needed for this patient.  For questions, please contact:   , RN BSN CCM [facility] Liaison  [phone] business mobile phone Toll free office [phone]    

## 2018-04-08 NOTE — Progress Notes (Signed)
PROGRESS NOTE  Attending MD note  Patient was seen, examined,treatment plan was discussed with the PA-S.  I have personally reviewed the clinical findings, lab, imaging studies and management of this patient in detail. I agree with the documentation, as recorded by the PA-S  Patient is an 82 year old male history of hypertension, diabetes, COPD, obstructive sleep apnea, diastolic CHF, chronic kidney disease stage III who came in on 10/11 with worsening cough, shortness of breath and wheezing for the past 10 days.  Progressively got worse and decided to come to the ED.  He was found to have hypoxic respiratory failure on admission with bilateral wheezing and was admitted to the hospital.  Chest x-ray was fairly unremarkable.  BP 129/69 (BP Location: Right Arm)   Pulse 62   Temp 97.6 F (36.4 C) (Oral)   Resp 18   Ht 5\' 7"  (1.702 m)   Wt 88.4 kg   SpO2 90%   BMI 30.52 kg/m  On Exam: Gen. exam: Awake, alert, not in any distress Chest: Good air entry bilaterally, slight end expiratory wheezing without crackles.  Normal respiratory effort. CVS: S1-S2 regular, no murmurs Abdomen: Soft, nontender and nondistended Neurology: Non-focal, equal strength Skin: No rash or lesions  Subjective -He is doing fairly well, feels okay at rest.  On ambulation felt like he was getting lightheaded  Plan Acute hypoxic respiratory failure in the setting of COPD exacerbation and rhinovirus bronchiolitis -Blood cultures had 1/2 bottles with coag negative staph, likely to be a contaminant -Monitor off antibiotics, supportive treatment with nebulizers, IV steroids  Chronic kidney disease stage III-IV -Baseline creatinine around 2.2, 2.3 this morning  Hypertension -Continue Norvasc, hydralazine  Diabetes mellitus -Placed on sliding scale, monitor closely as he is on steroids  Hyperlipidemia -Continue Lipitor  Chronic diastolic CHF -Chest x-ray clear, he appears euvolemic   Rest as  below  Ladawn Boullion M. Cruzita Lederer, MD [facility] [phone]  If 7PM-7AM, please contact night-coverage [email or web address] Password [email or web address]    Jereld Presti TKZ:[id number] DOB: [birth year] DOA: 04/07/2018 PCP: Gayland Curry, DO   LOS: 0 days   Brief Narrative / Interim history: Ian [name] is a 82 y/o male with medical history significant for HTN, DM2, COPD, dementia, BPH, OSA not on CPAP, diastolic CHF, and chronic kidney disease stage III, who presented to the ED 10/1 with worsening cough and SOB for the past 10 days. On presentation, he was afebrile, tachypneic, and found to be hypoxic with bilateral rhonchi and wheezing. EKG showed normal sinus rhythm and CXR was negative for infiltrates. Troponin, lactic acid, and procalcitonin were all negative. Initially, the patient was placed on supplemental O2 and was started on IV steroids, bronchodilators, and empiric antibiotics for COPD exacerbation in the setting of possible pneumonia. Blood cultures were obtained and pending respiratory virus panel.  Subjective: Patient says his breathing is much better today and his cough has improved. Still requiring 1L [place]. Denies chest pain, worsening SOB, fever/chills, or LE edema.   Assessment & Plan: Principal Problem:   Acute on chronic respiratory failure with hypoxia (HCC) Active Problems:   Type II diabetes mellitus with renal manifestations (HCC)   Depression   Chronic diastolic CHF (congestive heart failure) (HCC)   CKD (chronic kidney disease), stage III (HCC)   COPD exacerbation (HCC)   HLD (hyperlipidemia)   HTN (hypertension)  1. Acute on chronic respiratory failure with hypoxia in the setting of COPD exacerbation, Rhinovirus bronchiolitis -Blood cultures had no significant growth, respiratory virus panel positive for Rhinovirus -  Antibiotics d/c'ed, continue neb treatments, mucinex, delsym, and IV solumedrol, will transition to PO prednisone  -Continue to wean off [place] O2, currently on 1L  2.  Type II DM with renal manifestations, CKD stage III -Last Hgb A1c on 01/29/18 was 6.2 and well controlled -Continue SSI, CBGs stable -Creatinine 2.38 today, baseline of 1.7-2.2, recheck BMP   3. Chronic diastolic CHF -BNP mildly elevated on admission, no lower extremity edema, JVD, or pulmonary edema on CXR and stable -2D echo on 01/03/2013 showed EF 60-65% with grade 1 diastolic dysfunction  4. Hyperlipidemia -Stable, continue statin   5. Hypertension -BP stable continue home meds - IV hydralazine PRN  6. Depression -Stable, continue home meds  Scheduled Meds: . amLODipine  10 mg Oral Daily  . aspirin EC  81 mg Oral Daily  . atorvastatin  20 mg Oral q1800  . budesonide (PULMICORT) nebulizer solution  0.25 mg Nebulization BID  . dextromethorphan  15 mg Oral BID  . guaiFENesin  600 mg Oral BID  . heparin  5,000 Units Subcutaneous Q8H  . hydrALAZINE  25 mg Oral BID  . insulin aspart  0-5 Units Subcutaneous QHS  . insulin aspart  0-9 Units Subcutaneous TID WC  . ipratropium-albuterol  3 mL Nebulization TID  . losartan  25 mg Oral Daily  . methylPREDNISolone (SOLU-MEDROL) injection  60 mg Intravenous TID  . metoprolol tartrate  50 mg Oral BID  . mometasone-formoterol  2 puff Inhalation BID  . montelukast  10 mg Oral QHS  . PARoxetine  20 mg Oral Daily  . traZODone  50 mg Oral QHS   Continuous Infusions: PRN Meds:.acetaminophen, albuterol, hydrALAZINE, ondansetron (ZOFRAN) IV, polyethylene glycol, traMADol, zolpidem  DVT prophylaxis: heparin Code Status: full code Family Communication: none at bedside Disposition Plan: home pending clinical improvement  Consultants:   none  Procedures:   none  Antimicrobials:  Ceftriaxone, azithromycin 10/1 d/c'ed  Objective: Vitals:   04/07/18 2358 04/08/18 0435 04/08/18 0738 04/08/18 0826  BP: (!) 166/78   129/69  Pulse: (!) 57 (!) 56  62  Resp: (!) 24 18    Temp: 97.7 F (36.5 C)   97.6 F (36.4 C)  TempSrc: Oral    Oral  SpO2: 93% 93% 90% 90%  Weight:      Height:        Intake/Output Summary (Last 24 hours) at 04/08/2018 1419 Last data filed at 04/07/2018 1945 Gross per 24 hour  Intake 724.51 ml  Output 100 ml  Net 624.51 ml   Filed Weights   04/07/18 0445  Weight: 88.4 kg    Examination:  Constitutional: Lying in bed, NAD Respiratory: adequate air movement bilaterally, no wheezing  Cardiovascular: Regular rate and rhythm, no murmurs Abdomen: soft, non tender, non distended, +BS  Musculoskeletal: no clubbing or cyanosis Skin: no rashes, lesions, ulcers Neurologic: alert and oriented x3, non focal  Psychiatric: normal mood and affect   Data Reviewed: I have independently reviewed following labs and imaging studies   CBC: Recent Labs  Lab 04/07/18 0114 04/08/18 0405  WBC 13.0* 11.6*  HGB 13.4 11.8*  HCT 41.0 35.8*  MCV 99.3 97.8  PLT 143* 701*   Basic Metabolic Panel: Recent Labs  Lab 04/07/18 0114 04/08/18 0405  NA 143 136  K 4.3 4.7  CL 105 108  CO2 28 23  GLUCOSE 165* 213*  BUN 44* 46*  CREATININE 2.20* 2.38*  CALCIUM 10.2 9.3   GFR: Estimated Creatinine Clearance: 26.3 mL/min (A) (by C-G  formula based on SCr of 2.38 mg/dL (H)). Liver Function Tests: No results for input(s): AST, ALT, ALKPHOS, BILITOT, PROT, ALBUMIN in the last 168 hours. No results for input(s): LIPASE, AMYLASE in the last 168 hours. No results for input(s): AMMONIA in the last 168 hours. Coagulation Profile: No results for input(s): INR, PROTIME in the last 168 hours. Cardiac Enzymes: Recent Labs  Lab 04/07/18 0313  TROPONINI <0.03   BNP (last 3 results) No results for input(s): PROBNP in the last 8760 hours. HbA1C: No results for input(s): HGBA1C in the last 72 hours. CBG: Recent Labs  Lab 04/07/18 1234 04/07/18 1658 04/07/18 2127 04/08/18 0718 04/08/18 1214  GLUCAP 191* 178* 250* 197* 233*   Lipid Profile: No results for input(s): CHOL, HDL, LDLCALC, TRIG, CHOLHDL,  LDLDIRECT in the last 72 hours. Thyroid Function Tests: No results for input(s): TSH, T4TOTAL, FREET4, T3FREE, THYROIDAB in the last 72 hours. Anemia Panel: No results for input(s): VITAMINB12, FOLATE, FERRITIN, TIBC, IRON, RETICCTPCT in the last 72 hours. Urine analysis:    Component Value Date/Time   COLORURINE YELLOW 12/29/2012 [place] 12/29/2012 1650   LABSPEC 1.025 12/29/2012 1650   PHURINE 5.5 12/29/2012 1650   GLUCOSEU NEGATIVE 12/29/2012 1650   HGBUR MODERATE (A) 12/29/2012 1650   BILIRUBINUR SMALL (A) 12/29/2012 1650   KETONESUR NEGATIVE 12/29/2012 1650   PROTEINUR >300 (A) 12/29/2012 1650   UROBILINOGEN 1.0 12/29/2012 1650   NITRITE NEGATIVE 12/29/2012 1650   LEUKOCYTESUR NEGATIVE 12/29/2012 1650   Sepsis Labs: Invalid input(s): PROCALCITONIN, LACTICIDVEN  Recent Results (from the past 240 hour(s))  Blood culture (routine x 2)     Status: None (Preliminary result)   Collection Time: 04/07/18  3:14 AM  Result Value Ref Range Status   Specimen Description BLOOD RIGHT HAND  Final   Special Requests   Final    BOTTLES DRAWN AEROBIC AND ANAEROBIC Blood Culture adequate volume   Culture   Final    NO GROWTH 1 DAY Performed at [facility], [address], [place], [place] [address]    Report Status PENDING  Incomplete  Blood culture (routine x 2)     Status: Abnormal (Preliminary result)   Collection Time: 04/07/18  3:14 AM  Result Value Ref Range Status   Specimen Description BLOOD RIGHT WRIST  Final   Special Requests   Final    BOTTLES DRAWN AEROBIC AND ANAEROBIC Blood Culture adequate volume   Culture  Setup Time   Final    AEROBIC BOTTLE ONLY GRAM POSITIVE COCCI CRITICAL RESULT CALLED TO, READ BACK BY AND VERIFIED WITH: L SEAY PHARMD 0100 04/08/18 A BROWNING    Culture (A)  Final    STAPHYLOCOCCUS SPECIES (COAGULASE NEGATIVE) THE SIGNIFICANCE OF ISOLATING THIS ORGANISM FROM A SINGLE SET OF BLOOD CULTURES WHEN MULTIPLE SETS ARE DRAWN IS  UNCERTAIN. PLEASE NOTIFY THE MICROBIOLOGY DEPARTMENT WITHIN ONE WEEK IF SPECIATION AND SENSITIVITIES ARE REQUIRED. Performed at [facility], [place] [address], [place], [place] [address]    Report Status PENDING  Incomplete  Blood Culture ID Panel (Reflexed)     Status: Abnormal   Collection Time: 04/07/18  3:14 AM  Result Value Ref Range Status   Enterococcus species NOT DETECTED NOT DETECTED Final   Listeria monocytogenes NOT DETECTED NOT DETECTED Final   Staphylococcus species DETECTED (A) NOT DETECTED Final    Comment: Methicillin (oxacillin) resistant coagulase negative staphylococcus. Possible blood culture contaminant (unless isolated from more than one blood culture draw  or clinical case suggests pathogenicity). No antibiotic treatment is indicated for blood  culture contaminants. CRITICAL RESULT CALLED TO, READ BACK BY AND VERIFIED WITH: L SEAY PHARMD 0100 04/08/18 A BROWNING    Staphylococcus aureus NOT DETECTED NOT DETECTED Final   Methicillin resistance DETECTED (A) NOT DETECTED Final    Comment: CRITICAL RESULT CALLED TO, READ BACK BY AND VERIFIED WITH: L SEAY PHARMD 0100 04/08/18 A BROWNING    Streptococcus species NOT DETECTED NOT DETECTED Final   Streptococcus agalactiae NOT DETECTED NOT DETECTED Final   Streptococcus pneumoniae NOT DETECTED NOT DETECTED Final   Streptococcus pyogenes NOT DETECTED NOT DETECTED Final   Acinetobacter baumannii NOT DETECTED NOT DETECTED Final   Enterobacteriaceae species NOT DETECTED NOT DETECTED Final   Enterobacter cloacae complex NOT DETECTED NOT DETECTED Final   Escherichia coli NOT DETECTED NOT DETECTED Final   Klebsiella oxytoca NOT DETECTED NOT DETECTED Final   Klebsiella pneumoniae NOT DETECTED NOT DETECTED Final   Proteus species NOT DETECTED NOT DETECTED Final   Serratia marcescens NOT DETECTED NOT DETECTED Final   Haemophilus influenzae NOT DETECTED NOT DETECTED Final   Neisseria meningitidis NOT DETECTED NOT DETECTED Final    Pseudomonas aeruginosa NOT DETECTED NOT DETECTED Final   Candida albicans NOT DETECTED NOT DETECTED Final   Candida glabrata NOT DETECTED NOT DETECTED Final   Candida krusei NOT DETECTED NOT DETECTED Final   Candida parapsilosis NOT DETECTED NOT DETECTED Final   Candida tropicalis NOT DETECTED NOT DETECTED Final    Comment: Performed at [facility], [place]. [address] [place], [place] [address]  Respiratory Panel by PCR     Status: Abnormal   Collection Time: 04/07/18  4:52 AM  Result Value Ref Range Status   Adenovirus NOT DETECTED NOT DETECTED Final   Coronavirus 229E NOT DETECTED NOT DETECTED Final   Coronavirus HKU1 NOT DETECTED NOT DETECTED Final   Coronavirus NL63 NOT DETECTED NOT DETECTED Final   Coronavirus OC43 NOT DETECTED NOT DETECTED Final   Metapneumovirus NOT DETECTED NOT DETECTED Final   Rhinovirus / Enterovirus DETECTED (A) NOT DETECTED Final   Influenza A NOT DETECTED NOT DETECTED Final   Influenza B NOT DETECTED NOT DETECTED Final   Parainfluenza Virus 1 NOT DETECTED NOT DETECTED Final   Parainfluenza Virus 2 NOT DETECTED NOT DETECTED Final   Parainfluenza Virus 3 NOT DETECTED NOT DETECTED Final   Parainfluenza Virus 4 NOT DETECTED NOT DETECTED Final   Respiratory Syncytial Virus NOT DETECTED NOT DETECTED Final   Bordetella pertussis NOT DETECTED NOT DETECTED Final   Chlamydophila pneumoniae NOT DETECTED NOT DETECTED Final   Mycoplasma pneumoniae NOT DETECTED NOT DETECTED Final    Comment: Performed at [facility], [address], [place], [place] [address]      Radiology Studies: Dg Chest 2 View  Result Date: 04/07/2018 CLINICAL DATA:  Shortness of breath, cough EXAM: CHEST - 2 VIEW COMPARISON:  10/20/2017 FINDINGS: Lungs are clear.  No pleural effusion or pneumothorax. Cardiomegaly. Visualized osseous structures are within normal limits. IMPRESSION: No evidence of acute cardiopulmonary disease. Electronically Signed   By: Julian Hy M.D.    On: 04/07/2018 01:47     Larene Beach DeFillipo, PA-S [facility]  If 7PM-7AM, please contact night-coverage [email or web address] Password [email or web address] 04/08/2018, 2:19 PM   [email or web address] @

## 2018-04-08 NOTE — Care Management Obs Status (Signed)
[facility] NOTIFICATION   Patient Details  Name: Ian [name] MRN: [id number] Date of Birth: Nov 16, 1935   Medicare Observation Status Notification Given:  Yes    Zenon Mayo, RN 04/08/2018, 12:59 PM

## 2018-04-09 DIAGNOSIS — J440 Chronic obstructive pulmonary disease with acute lower respiratory infection: Secondary | ICD-10-CM

## 2018-04-09 DIAGNOSIS — J209 Acute bronchitis, unspecified: Secondary | ICD-10-CM

## 2018-04-09 DIAGNOSIS — I10 Essential (primary) hypertension: Secondary | ICD-10-CM

## 2018-04-09 LAB — BASIC METABOLIC PANEL
ANION GAP: 6 (ref 5–15)
BUN: 62 mg/dL — ABNORMAL HIGH (ref 8–23)
CALCIUM: 9.8 mg/dL (ref 8.9–10.3)
CO2: 23 mmol/L (ref 22–32)
Chloride: 108 mmol/L (ref 98–111)
Creatinine, Ser: 2.44 mg/dL — ABNORMAL HIGH (ref 0.61–1.24)
GFR, EST AFRICAN AMERICAN: 27 mL/min — AB (ref >60)
GFR, EST NON AFRICAN AMERICAN: 23 mL/min — AB (ref >60)
Glucose, Bld: 238 mg/dL — ABNORMAL HIGH (ref 70–99)
POTASSIUM: 4.7 mmol/L (ref 3.5–5.1)
SODIUM: 137 mmol/L (ref 135–145)

## 2018-04-09 LAB — CBC
HCT: 34.6 % — ABNORMAL LOW (ref 39.0–52.0)
Hemoglobin: 11.4 g/dL — ABNORMAL LOW (ref 13.0–17.0)
MCH: 32.3 pg (ref 26.0–34.0)
MCHC: 32.9 g/dL (ref 30.0–36.0)
MCV: 98 fL (ref 78.0–100.0)
PLATELETS: 156 10*3/uL (ref 150–400)
RBC: 3.53 MIL/uL — AB (ref 4.22–5.81)
RDW: 13.6 % (ref 11.5–15.5)
WBC: 11.5 10*3/uL — ABNORMAL HIGH (ref 4.0–10.5)

## 2018-04-09 LAB — GLUCOSE, CAPILLARY: GLUCOSE-CAPILLARY: 216 mg/dL — AB (ref 70–99)

## 2018-04-09 LAB — CULTURE, BLOOD (ROUTINE X 2): SPECIAL REQUESTS: ADEQUATE

## 2018-04-09 MED ORDER — PREDNISONE 10 MG PO TABS: 10.0000 mg | ORAL_TABLET | Freq: Every day | ORAL | 0 refills | Status: DC

## 2018-04-09 MED ORDER — DEXTROMETHORPHAN POLISTIREX ER 30 MG/5ML PO SUER: 15.0000 mg | Freq: Two times a day (BID) | ORAL | 0 refills | Status: DC | PRN

## 2018-04-09 NOTE — Discharge Instructions (Signed)
Follow with Hollace Kinnier L, DO in 5-7 days  Please get a complete blood count and chemistry panel checked by your Primary MD at your next visit, and again as instructed by your Primary MD. Please get your medications reviewed and adjusted by your Primary MD.  Please request your Primary MD to go over all Hospital Tests and Procedure/Radiological results at the follow up, please get all Hospital records sent to your Prim MD by signing hospital release before you go home.  If you had Pneumonia of Lung problems at the Hospital: Please get a 2 view Chest X ray done in 6-8 weeks after hospital discharge or sooner if instructed by your Primary MD.  If you have Congestive Heart Failure: Please call your Cardiologist or Primary MD anytime you have any of the following symptoms:  1) 3 pound weight gain in 24 hours or 5 pounds in 1 week  2) shortness of breath, with or without a dry hacking cough  3) swelling in the hands, feet or stomach  4) if you have to sleep on extra pillows at night in order to breathe  Follow cardiac low salt diet and 1.5 lit/day fluid restriction.  If you have diabetes Accuchecks 4 times/day, Once in AM empty stomach and then before each meal. Log in all results and show them to your primary doctor at your next visit. If any glucose reading is under 80 or above 300 call your primary MD immediately.  If you have Seizure/Convulsions/Epilepsy: Please do not drive, operate heavy machinery, participate in activities at heights or participate in high speed sports until you have seen by Primary MD or a Neurologist and advised to do so again.  If you had Gastrointestinal Bleeding: Please ask your Primary MD to check a complete blood count within one week of discharge or at your next visit. Your endoscopic/colonoscopic biopsies that are pending at the time of discharge, will also need to followed by your Primary MD.  Get Medicines reviewed and adjusted. Please take all your  medications with you for your next visit with your Primary MD  Please request your Primary MD to go over all hospital tests and procedure/radiological results at the follow up, please ask your Primary MD to get all Hospital records sent to his/her office.  If you experience worsening of your admission symptoms, develop shortness of breath, life threatening emergency, suicidal or homicidal thoughts you must seek medical attention immediately by calling 911 or calling your MD immediately  if symptoms less severe.  You must read complete instructions/literature along with all the possible adverse reactions/side effects for all the Medicines you take and that have been prescribed to you. Take any new Medicines after you have completely understood and accpet all the possible adverse reactions/side effects.   Do not drive or operate heavy machinery when taking Pain medications.   Do not take more than prescribed Pain, Sleep and Anxiety Medications  Special Instructions: If you have smoked or chewed Tobacco  in the last 2 yrs please stop smoking, stop any regular Alcohol  and or any Recreational drug use.  Wear Seat belts while driving.  Please note You were cared for by a hospitalist during your hospital stay. If you have any questions about your discharge medications or the care you received while you were in the hospital after you are discharged, you can call the unit and asked to speak with the hospitalist on call if the hospitalist that took care of you is not available.  Once you are discharged, your primary care physician will handle any further medical issues. Please note that NO REFILLS for any discharge medications will be authorized once you are discharged, as it is imperative that you return to your primary care physician (or establish a relationship with a primary care physician if you do not have one) for your aftercare needs so that they can reassess your need for medications and monitor your  lab values.  You can reach the hospitalist office at phone [phone] or fax [phone]   If you do not have a primary care physician, you can call [phone] for a physician referral.  Activity: As tolerated with Full fall precautions use walker/cane & assistance as needed  Diet: heart healthy  Disposition Home

## 2018-04-09 NOTE — Discharge Summary (Signed)
Physician Discharge Summary  Ian [name] VZD:[id number] DOB: [birth year] DOA: 04/07/2018  PCP: Gayland Curry, DO  Admit date: 04/07/2018 Discharge date: 04/09/2018  Admitted From: home Disposition:  home  Recommendations for Outpatient Follow-up:  1. Follow up with PCP in 1-2 weeks and obtain BMP 2. Follow-up with Dr. Posey Pronto as scheduled  Home Health: none Equipment/Devices: none  Discharge Condition: stable CODE STATUS: full code Diet recommendation: heart healthy  HPI: Per Dr. Dr. Suzi Roots is a 82 y.o. male with medical history significant of hypertension, hyperlipidemia, diabetes mellitus, COPD, BPH, OSA not on CPAP, dCHF, CKD 3, depression, anxiety, who presents with cough, shortness of breath. Patient states that he has been Having cough, shortness of breath for more than 10 days, which has worsened in the past several days.  He has chest tightness intermittently, currently no chest pain.  He coughs up white-colored mucus, no fever or chills.  Patient was found to have oxygen desaturation to 85% on room air in ED.  No nausea, vomiting, diarrhea or abdominal pain.  No symptoms of UTI or unilateral weakness.  No leg edema.  Hospital Course: Acute on chronic respiratory failure with hypoxia in the setting of COPD exacerbation, Rhinovirus bronchiolitis- Patient presented to the ED 10/1 with worsening cough and SOB for the past 10 days, he was wheezing, hypoxic and was admitted for COPD exacerbation. CXR was negative for infiltrates.  He was treated with IV steroids, bronchodilators, supplemental oxygen and improved with this measures.  He was initially placed on antibiotics but his respiratory panel was positive for Rhinovirus and antibiotics were discontinued. Blood cultures were obtained with 1/2 bottles with coag negative staph, likely to be contaminant. He returned to baseline, able to ambulate in the hallway on room air without difficulties, maintaining sats above 90%, asking to  go home, he will be discharged in stable condition with a quick prednisone taper. Type II DM with renal manifestations - Patient's last Hgb A1c on 01/29/18 was 6.2 and well controlled.  Resume home meds.  CKD stage III-Baseline creatinine around 2.2 with prior values as high as 2.8, creatinine 2.44 on discharge which is similar to his most recent values.  He is seeing Dr. Posey Pronto as an outpatient.  I will discontinue his ARB and have asked patient to follow-up either with his PCP or Dr. Posey Pronto within the next 1 to 2 weeks to repeat renal function. Chronic diastolic CHF- 2D echo on 2/95/1884 showed EF 60-65% with grade 1 diastolic dysfunction, and patient's BNP 436 mildly elevated on admission. Remained euvolemic with no lower extremity edema, JVD, or pulmonary edema on CXR and stable. Continue home meds. Hyperlipidemia- continue home meds.  Hypertension- BP stable throughout admission. Continue norvasc and hydralazine, discontinue Cozaar due to renal function and follow up with PCP.  Depression- continue home meds.  Discharge Diagnoses:  Principal Problem:   Acute on chronic respiratory failure with hypoxia (HCC) Active Problems:   Type II diabetes mellitus with renal manifestations (HCC)   Depression   Chronic diastolic CHF (congestive heart failure) (HCC)   CKD (chronic kidney disease), stage III (HCC)   COPD exacerbation (HCC)   HLD (hyperlipidemia)   HTN (hypertension)    Discharge Instructions   Allergies as of 04/09/2018      Reactions   Ace Inhibitors Cough      Medication List    STOP taking these medications   losartan 25 MG tablet Commonly known as:  COZAAR     TAKE these  medications   albuterol 108 (90 Base) MCG/ACT inhaler Commonly known as:  PROVENTIL HFA;VENTOLIN HFA Inhale 2 puffs into the lungs every 6 (six) hours as needed for wheezing or shortness of breath.   amLODipine 10 MG tablet Commonly known as:  NORVASC Take 1 tablet (10 mg total) by mouth daily.     aspirin EC 81 MG tablet Take 1 tablet (81 mg total) by mouth daily.   atorvastatin 20 MG tablet Commonly known as:  LIPITOR Take one tablet by mouth once daily for cholesterol   budesonide-formoterol 160-4.5 MCG/ACT inhaler Commonly known as:  SYMBICORT Inhale 2 puffs into the lungs 2 (two) times daily.   dextromethorphan 30 MG/5ML liquid Commonly known as:  DELSYM Take 2.5 mLs (15 mg total) by mouth 2 (two) times daily as needed for cough.   doxycycline 100 MG tablet Commonly known as:  VIBRA-TABS Take 1 tablet (100 mg total) by mouth 2 (two) times daily.   glucose blood test strip USE AS INSTRUCTED TO TEST BLOOD SUGAR ONCE DAILY DX E119   hydrALAZINE 25 MG tablet Commonly known as:  APRESOLINE Take 1 tablet (25 mg total) by mouth 2 (two) times daily.   ipratropium-albuterol 0.5-2.5 (3) MG/3ML Soln Commonly known as:  DUONEB Take 3 mLs by nebulization every 6 (six) hours as needed (wheezing).   metoprolol tartrate 50 MG tablet Commonly known as:  LOPRESSOR Take 1 tablet (50 mg total) by mouth 2 (two) times daily.   montelukast 10 MG tablet Commonly known as:  SINGULAIR Take 1 tablet (10 mg total) by mouth at bedtime.   ONE TOUCH DELICA LANCING DEV Misc Use to test blood sugar once daily dx: E119   onetouch ultrasoft lancets Use to test blood sugar up to three times daily. Dx E11.9   PARoxetine 20 MG tablet Commonly known as:  PAXIL Take 1 tablet by mouth once daily for depression   polyethylene glycol powder powder Commonly known as:  GLYCOLAX/MIRALAX Take 17 g by mouth every 3 (three) days. For constipation; hold for loose stools What changed:    when to take this  reasons to take this   predniSONE 10 MG tablet Commonly known as:  DELTASONE Take 1 tablet (10 mg total) by mouth daily. 4 tablets daily x 2 days then 3 daily x 2 days then 2 daily x 2 days then 1 daily x 2 days What changed:    medication strength  how much to take  when to take  this  additional instructions   sitaGLIPtin 25 MG tablet Commonly known as:  JANUVIA Take 1 tablet (25 mg total) by mouth daily.   traMADol 50 MG tablet Commonly known as:  ULTRAM TAKE 1 TABLET BY MOUTH TWICE A DAY What changed:    when to take this  reasons to take this   traZODone 50 MG tablet Commonly known as:  DESYREL Take 1 tablet (50 mg total) by mouth at bedtime.      Follow-up Information    [facility] Follow up.   Specialty:  Home Health Services Why:  [facility] for disease management of copd Contact information: [address] [place] [address] [phone]        Gayland Curry, DO. Schedule an appointment as soon as possible for a visit in 1 week(s).   Specialty:  Geriatric Medicine Contact information: [address] [place] [address] [phone]           Consultations:  None  Procedures/Studies:  Dg Chest 2 View  Result Date: 04/07/2018 CLINICAL DATA:  Shortness of breath, cough EXAM: CHEST - 2 VIEW COMPARISON:  10/20/2017 FINDINGS: Lungs are clear.  No pleural effusion or pneumothorax. Cardiomegaly. Visualized osseous structures are within normal limits. IMPRESSION: No evidence of acute cardiopulmonary disease. Electronically Signed   By: Julian Hy M.D.   On: 04/07/2018 01:47      Subjective: Patient says he feels better and is ready to go home. He ambulating in the hallway without [place] oxygen and sats remained in the 90s. Denies chest pain, SOB, fever/chills, urinary changes, abdominal pain.   Discharge Exam: Vitals:   04/09/18 0754 04/09/18 0916  BP: (!) 154/72   Pulse: (!) 58   Resp:    Temp: (!) 97.5 F (36.4 C)   SpO2: 92% 92%    General: Sitting comfortably in bed, NAD Cardiovascular: regular rate and rhythm, no murmurs Respiratory: good air movement bilaterally, mild expiratory wheeze, no rhonchi rales, normal respiratory effort Abdominal: Soft, non tender, non distended,  +BS Extremities: no LE edema or cyanosis   The results of significant diagnostics from this hospitalization (including imaging, microbiology, ancillary and laboratory) are listed below for reference.     Microbiology: Recent Results (from the past 240 hour(s))  Blood culture (routine x 2)     Status: None (Preliminary result)   Collection Time: 04/07/18  3:14 AM  Result Value Ref Range Status   Specimen Description BLOOD RIGHT HAND  Final   Special Requests   Final    BOTTLES DRAWN AEROBIC AND ANAEROBIC Blood Culture adequate volume   Culture   Final    NO GROWTH 2 DAYS Performed at [facility], [address] [place], [place] [address]    Report Status PENDING  Incomplete  Blood culture (routine x 2)     Status: Abnormal   Collection Time: 04/07/18  3:14 AM  Result Value Ref Range Status   Specimen Description BLOOD RIGHT WRIST  Final   Special Requests   Final    BOTTLES DRAWN AEROBIC AND ANAEROBIC Blood Culture adequate volume   Culture  Setup Time   Final    AEROBIC BOTTLE ONLY GRAM POSITIVE COCCI CRITICAL RESULT CALLED TO, READ BACK BY AND VERIFIED WITH: L SEAY PHARMD 0100 04/08/18 A BROWNING    Culture (A)  Final    STAPHYLOCOCCUS SPECIES (COAGULASE NEGATIVE) THE SIGNIFICANCE OF ISOLATING THIS ORGANISM FROM A SINGLE SET OF BLOOD CULTURES WHEN MULTIPLE SETS ARE DRAWN IS UNCERTAIN. PLEASE NOTIFY THE MICROBIOLOGY DEPARTMENT WITHIN ONE WEEK IF SPECIATION AND SENSITIVITIES ARE REQUIRED. Performed at [facility], [place] [address] [place], [place] [address]    Report Status 04/09/2018 FINAL  Final  Blood Culture ID Panel (Reflexed)     Status: Abnormal   Collection Time: 04/07/18  3:14 AM  Result Value Ref Range Status   Enterococcus species NOT DETECTED NOT DETECTED Final   Listeria monocytogenes NOT DETECTED NOT DETECTED Final   Staphylococcus species DETECTED (A) NOT DETECTED Final    Comment: Methicillin (oxacillin) resistant coagulase negative staphylococcus.  Possible blood culture contaminant (unless isolated from more than one blood culture draw or clinical case suggests pathogenicity). No antibiotic treatment is indicated for blood  culture contaminants. CRITICAL RESULT CALLED TO, READ BACK BY AND VERIFIED WITH: L SEAY PHARMD 0100 04/08/18 A BROWNING    Staphylococcus aureus NOT DETECTED NOT DETECTED Final   Methicillin resistance DETECTED (A) NOT DETECTED Final    Comment: CRITICAL  RESULT CALLED TO, READ BACK BY AND VERIFIED WITH: L SEAY PHARMD 0100 04/08/18 A BROWNING    Streptococcus species NOT DETECTED NOT DETECTED Final   Streptococcus agalactiae NOT DETECTED NOT DETECTED Final   Streptococcus pneumoniae NOT DETECTED NOT DETECTED Final   Streptococcus pyogenes NOT DETECTED NOT DETECTED Final   Acinetobacter baumannii NOT DETECTED NOT DETECTED Final   Enterobacteriaceae species NOT DETECTED NOT DETECTED Final   Enterobacter cloacae complex NOT DETECTED NOT DETECTED Final   Escherichia coli NOT DETECTED NOT DETECTED Final   Klebsiella oxytoca NOT DETECTED NOT DETECTED Final   Klebsiella pneumoniae NOT DETECTED NOT DETECTED Final   Proteus species NOT DETECTED NOT DETECTED Final   Serratia marcescens NOT DETECTED NOT DETECTED Final   Haemophilus influenzae NOT DETECTED NOT DETECTED Final   Neisseria meningitidis NOT DETECTED NOT DETECTED Final   Pseudomonas aeruginosa NOT DETECTED NOT DETECTED Final   Candida albicans NOT DETECTED NOT DETECTED Final   Candida glabrata NOT DETECTED NOT DETECTED Final   Candida krusei NOT DETECTED NOT DETECTED Final   Candida parapsilosis NOT DETECTED NOT DETECTED Final   Candida tropicalis NOT DETECTED NOT DETECTED Final    Comment: Performed at [facility], [place] [place], [place] [address]  Respiratory Panel by PCR     Status: Abnormal   Collection Time: 04/07/18  4:52 AM  Result Value Ref Range Status   Adenovirus NOT DETECTED NOT DETECTED Final   Coronavirus 229E NOT DETECTED NOT  DETECTED Final   Coronavirus HKU1 NOT DETECTED NOT DETECTED Final   Coronavirus NL63 NOT DETECTED NOT DETECTED Final   Coronavirus OC43 NOT DETECTED NOT DETECTED Final   Metapneumovirus NOT DETECTED NOT DETECTED Final   Rhinovirus / Enterovirus DETECTED (A) NOT DETECTED Final   Influenza A NOT DETECTED NOT DETECTED Final   Influenza B NOT DETECTED NOT DETECTED Final   Parainfluenza Virus 1 NOT DETECTED NOT DETECTED Final   Parainfluenza Virus 2 NOT DETECTED NOT DETECTED Final   Parainfluenza Virus 3 NOT DETECTED NOT DETECTED Final   Parainfluenza Virus 4 NOT DETECTED NOT DETECTED Final   Respiratory Syncytial Virus NOT DETECTED NOT DETECTED Final   Bordetella pertussis NOT DETECTED NOT DETECTED Final   Chlamydophila pneumoniae NOT DETECTED NOT DETECTED Final   Mycoplasma pneumoniae NOT DETECTED NOT DETECTED Final    Comment: Performed at [facility], [address], [place], [place] [address]     Labs: BNP (last 3 results) Recent Labs    04/07/18 0242  BNP 147.8*   Basic Metabolic Panel: Recent Labs  Lab 04/07/18 0114 04/08/18 0405 04/09/18 0330  NA 143 136 137  K 4.3 4.7 4.7  CL 105 108 108  CO2 28 23 23   GLUCOSE 165* 213* 238*  BUN 44* 46* 62*  CREATININE 2.20* 2.38* 2.44*  CALCIUM 10.2 9.3 9.8   Liver Function Tests: No results for input(s): AST, ALT, ALKPHOS, BILITOT, PROT, ALBUMIN in the last 168 hours. No results for input(s): LIPASE, AMYLASE in the last 168 hours. No results for input(s): AMMONIA in the last 168 hours. CBC: Recent Labs  Lab 04/07/18 0114 04/08/18 0405 04/09/18 0330  WBC 13.0* 11.6* 11.5*  HGB 13.4 11.8* 11.4*  HCT 41.0 35.8* 34.6*  MCV 99.3 97.8 98.0  PLT 143* 130* 156   Cardiac Enzymes: Recent Labs  Lab 04/07/18 0313  TROPONINI <0.03   BNP: Invalid input(s): POCBNP CBG: Recent Labs  Lab 04/08/18 0718 04/08/18 1214 04/08/18 1700 04/08/18 2138 04/09/18 0753  GLUCAP 197* 233* 219* 295* 216*   D-Dimer No results  for input(s): DDIMER in the last 72 hours. Hgb A1c No results for input(s): HGBA1C in the last 72 hours. Lipid Profile No results for input(s): CHOL, HDL, LDLCALC, TRIG, CHOLHDL, LDLDIRECT in the last 72 hours. Thyroid function studies No results for input(s): TSH, T4TOTAL, T3FREE, THYROIDAB in the last 72 hours.  Invalid input(s): FREET3 Anemia work up No results for input(s): VITAMINB12, FOLATE, FERRITIN, TIBC, IRON, RETICCTPCT in the last 72 hours. Urinalysis    Component Value Date/Time   COLORURINE YELLOW 12/29/2012 1650   APPEARANCEUR CLEAR 12/29/2012 1650   LABSPEC 1.025 12/29/2012 1650   PHURINE 5.5 12/29/2012 1650   GLUCOSEU NEGATIVE 12/29/2012 1650   HGBUR MODERATE (A) 12/29/2012 1650   BILIRUBINUR SMALL (A) 12/29/2012 1650   KETONESUR NEGATIVE 12/29/2012 1650   PROTEINUR >300 (A) 12/29/2012 1650   UROBILINOGEN 1.0 12/29/2012 1650   NITRITE NEGATIVE 12/29/2012 1650   LEUKOCYTESUR NEGATIVE 12/29/2012 1650   Sepsis Labs Invalid input(s): PROCALCITONIN,  WBC,  LACTICIDVEN   Time coordinating discharge: 25 minutes  SIGNED:  Larene Beach DeFillipo, PA-S  [facility] 04/09/2018, 9:57 AM  If 7PM-7AM, please contact night-coverage [email or web address] Password [email or web address]

## 2018-04-09 NOTE — Progress Notes (Signed)
SATURATION QUALIFICATIONS: (This note is used to comply with regulatory documentation for home oxygen)  Patient Saturations on Room Air at Rest = 92%  Patient Saturations on Room Air while Ambulating =92%  Patient Saturations on1 Liters of oxygen while Ambulating =92%  Please briefly explain why patient needs home oxygen: 

## 2018-04-10 ENCOUNTER — Other Ambulatory Visit: Payer: Self-pay | Admitting: *Deleted

## 2018-04-10 ENCOUNTER — Telehealth: Payer: Self-pay

## 2018-04-10 NOTE — Patient Outreach (Signed)
  [facility]) Care Management  04/10/2018  Soua Caltagirone 26-Nov-1935 [id number]    Transition of care   RN spoke with pt today and introduced [facility] and the purpose for today's call. Verified identifiers and further discussed his recent discharge. Pt states she is "feeling fine" with no issues at this time. RN inquired on his COPD-breathing well with no distress, CHF-denies any swelling with no problems, HTN - no problems and DM reports BBQ last evening resulting in "a little under 200" but on average 116-130 and he reports he documents all his readings for his providers to view. RN offered to review his medications however pt states his son [name] takes care of his medications and did not wish to review at this time but states he has all his refills and verified enough supplies. Discussed a plan of care generated around his recent discharge related to his COPD/Medications/Medical appointments. Pt has also verified he has sufficient transportation with no needs.   Based upon the information provided RN offered a home visit for next week to further engage however pt reluctant and indicated he has "a lot going on". Viewing pt's scheduled appointments RN informed pt that he has a pending appointment with a provider on Tuesday. RN offered to visit any other day however pt declined. RN offered to follow up next Monday for ongoing transition of care and will attempt to offer once again the opportunity for a home visit (pt receptive). Plan of care discussed with goals and interventions that will be re-addressed next week. No other issues presented at this time. Will follow up accordingly.  [facility] CM Care Plan Problem One     Most Recent Value  Care Plan Problem One  Hospital prevention related to COPD  Role Documenting the Problem One  Care Management Coordinator  Care Plan for Problem One  Active  [facility] Long Term Goal   Pt will not have a readmission related to COPD exacerbation over the next 60  days  [facility] Long Term Goal Start Date  04/10/18  Interventions for Problem One Long Term Goal  Discussed COPD and prevention measures to avoid readmission COPD zones   [facility] CM Short Term Goal #1   pt will adhere to all prescribed discharge medications over the next 30 days  [facility] CM Short Term Goal #1 Start Date  04/10/18  Interventions for Short Term Goal #1  Discussed and verified supplies along with an understanding on his medications. Stress the importnace of a timely administration on all his medications along with usage of his COPD medications.  [facility] CM Short Term Goal #2   Aderence to all medical appointments over the next 30 days  [facility] CM Short Term Goal #2 Start Date  04/10/18  Interventions for Short Term Goal #2  Will verify pt's sufficient transportation to all his medical appointments and stress again the importance of attending all scheduled appointments to avoid acute issues from occurring with any needed changes.       [name], RN Care Management Coordinator [place] Office [phone]

## 2018-04-10 NOTE — Telephone Encounter (Signed)
Transition Care Management Follow-up Telephone Call  Date of discharge and from where: [facility] on 04/09/2018  How have you been since you were released from the hospital? Feeling good since he has been home. Says he has no more SOB, and minimal nonproductive cough  Any questions or concerns? No   Items Reviewed:  Did the pt receive and understand the discharge instructions provided? Yes   Medications obtained and verified? Yes , he has stopped Losartan  Any new allergies since your discharge? No   Dietary orders reviewed? Yes  Do you have support at home? No , son comes overs to help with medications  Other (ie: DME, Home Health, etc) N/A  Functional Questionnaire: (I = Independent and D = Dependent) ADL's: I  Bathing/Dressing- I   Meal Prep- I   Eating- I  Maintaining continence- I  Transferring/Ambulation- I  Managing Meds- I w/ assist of son   Follow up appointments reviewed:    PCP Hospital f/u appt confirmed? Yes  Scheduled to see Sherrie Mustache NP on 04/14/2018 @ 11:30am.  [facility] f/u appt confirmed? N/A  Are transportation arrangements needed? Yes   If their condition worsens, is the pt aware to call  their PCP or go to the ED? Yes  Was the patient provided with contact information for the PCP's office or ED? Yes  Was the pt encouraged to call back with questions or concerns? Yes

## 2018-04-12 LAB — CULTURE, BLOOD (ROUTINE X 2)
CULTURE: NO GROWTH
Special Requests: ADEQUATE

## 2018-04-13 ENCOUNTER — Encounter: Payer: Self-pay | Admitting: *Deleted

## 2018-04-13 ENCOUNTER — Other Ambulatory Visit: Payer: Self-pay | Admitting: *Deleted

## 2018-04-13 NOTE — Patient Outreach (Signed)
[facility]) Care Management  04/13/2018  Ian [name] 1936-03-16 [id number]  Transition of care   RN spoke with pt today and reintroduced the [facility] program and again the purpose for today's call. Pt remember that RN case manager was going to call today and was prepared to report his program. Pt reports he is breathing good and has started walking inside with no reported distress. RN inquired on his management of care concerning use of his emergency inhaler. Pt states he uses that inhaler "maybe once in a blue moon".  States he continues to drive himself to all his medical appointments with no reported issues. States he has all his medications and takes as prescribed with again no reported issues. RN offered once again to further engage with home visits however pt opt to decline once again indicating he is "doing fine" and does not feel he needs home visits. Pt was receptive to ongoing transition of care calls and willing to allow next follow up call for next week. Care plan was updated based upon pt's progress and interventions discussed and adjusted accordingly. Will continue to encouraged adherence with the current plan of care and offer any needed assistance (denied any needs). Will continue to follow.  [name], RN Care Management Coordinator [place] Office [phone]

## 2018-04-14 ENCOUNTER — Telehealth: Payer: Self-pay | Admitting: Pharmacist

## 2018-04-14 ENCOUNTER — Encounter: Payer: Self-pay | Admitting: Nurse Practitioner

## 2018-04-14 NOTE — Patient Outreach (Signed)
[facility]) Care Management  04/14/2018  Maribel Luis Apr 03, 1936 [id number]   Patient was called for post discharge medication review. HIPAA identifiers were obtained.  Patient is an 82 year old male with multiple medical conditions including but not limited to:  hypertension, hyperlipidemia, type 2 diabetes mellitus, COPD, dementia, BPH, OSA, diastolic CHF, CKD stage III, depression, anxiety and insomnia.  Patient was recently hospitalized for shortness of breath.    Unfortunately, after HIPAA identifiers were obtained, the patient said he did not have time to review his medications and was not interested in having a post discharge pharmacy phone visit as his son takes care of his medications.  Plan: Close patient's pharmacy episode.  Elayne Guerin, PharmD, [place] Clinical Pharmacist [phone]

## 2018-04-16 ENCOUNTER — Encounter: Payer: Self-pay | Admitting: Nurse Practitioner

## 2018-04-16 ENCOUNTER — Ambulatory Visit (INDEPENDENT_AMBULATORY_CARE_PROVIDER_SITE_OTHER): Payer: PPO | Admitting: Nurse Practitioner

## 2018-04-16 VITALS — BP 136/84 | HR 62 | Temp 97.8°F | Ht 67.0 in | Wt 198.0 lb

## 2018-04-16 DIAGNOSIS — J441 Chronic obstructive pulmonary disease with (acute) exacerbation: Secondary | ICD-10-CM | POA: Diagnosis not present

## 2018-04-16 DIAGNOSIS — I10 Essential (primary) hypertension: Secondary | ICD-10-CM | POA: Diagnosis not present

## 2018-04-16 DIAGNOSIS — N183 Chronic kidney disease, stage 3 (moderate): Secondary | ICD-10-CM | POA: Diagnosis not present

## 2018-04-16 DIAGNOSIS — N1831 Chronic kidney disease, stage 3a: Secondary | ICD-10-CM

## 2018-04-16 LAB — CBC WITH DIFFERENTIAL/PLATELET
BASOS ABS: 75 {cells}/uL (ref 0–200)
BASOS PCT: 0.5 %
EOS PCT: 2.4 %
Eosinophils Absolute: 360 cells/uL (ref 15–500)
HCT: 39.5 % (ref 38.5–50.0)
HEMOGLOBIN: 13.2 g/dL (ref 13.2–17.1)
LYMPHS ABS: 1605 {cells}/uL (ref 850–3900)
MCH: 32.4 pg (ref 27.0–33.0)
MCHC: 33.4 g/dL (ref 32.0–36.0)
MCV: 97.1 fL (ref 80.0–100.0)
MONOS PCT: 6.7 %
MPV: 10.1 fL (ref 7.5–12.5)
NEUTROS ABS: 11955 {cells}/uL — AB (ref 1500–7800)
Neutrophils Relative %: 79.7 %
PLATELETS: 201 10*3/uL (ref 140–400)
RBC: 4.07 10*6/uL — AB (ref 4.20–5.80)
RDW: 13 % (ref 11.0–15.0)
Total Lymphocyte: 10.7 %
WBC mixed population: 1005 cells/uL — ABNORMAL HIGH (ref 200–950)
WBC: 15 10*3/uL — AB (ref 3.8–10.8)

## 2018-04-16 LAB — BASIC METABOLIC PANEL WITH GFR
BUN/Creatinine Ratio: 21 (calc) (ref 6–22)
BUN: 44 mg/dL — AB (ref 7–25)
CALCIUM: 9.8 mg/dL (ref 8.6–10.3)
CHLORIDE: 102 mmol/L (ref 98–110)
CO2: 31 mmol/L (ref 20–32)
Creat: 2.12 mg/dL — ABNORMAL HIGH (ref 0.70–1.11)
GFR, Est African American: 33 mL/min/{1.73_m2} — ABNORMAL LOW (ref >=60)
GFR, Est Non African American: 29 mL/min/{1.73_m2} — ABNORMAL LOW (ref >=60)
GLUCOSE: 234 mg/dL — AB (ref 65–139)
POTASSIUM: 4.8 mmol/L (ref 3.5–5.3)
Sodium: 138 mmol/L (ref 135–146)

## 2018-04-16 NOTE — Progress Notes (Signed)
Careteam: Patient Care Team: Gayland Curry, DO as PCP - General (Geriatric Medicine) Zebedee Iba., MD as Referring Physician (Ophthalmology) Chesley Mires, MD as Consulting Physician (Pulmonary Disease) Tobi Bastos, RN as [place] Management  Advanced Directive information Does Patient Have a Medical Advance Directive?: Yes, Type of Advance Directive: Living will  Allergies  Allergen Reactions  . Ace Inhibitors Cough    Chief Complaint  Patient presents with  . Transitions Of Care    Pt was recently admitted to [facility] 10/1 to 10/3.      HPI: Patient is a 82 y.o. male seen in the office today for hospital follow up.  Pt with medical history significant ofhypertension, hyperlipidemia, diabetes mellitus, COPD, BPH, OSA not on CPAP,dCHF, CKD 3, depression, anxiety, who went to ED presents with cough, shortness of breath.Patient was found to have oxygen desaturation to 85% on room air in ED. He was treated with IV steroids, bronchodilators, supplemental oxygen and improved with this measures.  He was initially placed on antibiotics but his respiratory panel was positive for Rhinovirus and antibiotics were discontinued. He returned to baseline, able to ambulate in the hallway on room air without difficulties, maintaining sats above 90%, asking to go home, he will be discharged in stable condition with a quick prednisone taper. Overall doing much better. No shortness of breath or wheezing. Still has a little cough with mucous that is bothersome.  Losartan was stopped due to worsening renal function.   Review of Systems:  Review of Systems  Constitutional: Negative for chills, fever and malaise/fatigue.  HENT: Negative for congestion and sore throat.   Respiratory: Positive for cough. Negative for sputum production, shortness of breath and wheezing.   Cardiovascular: Negative for chest pain, palpitations and leg swelling.  Gastrointestinal: Negative for  constipation.  Skin: Negative for itching and rash.    Past Medical History:  Diagnosis Date  . Allergy   . Alzheimer's disease ([place])   . Anemia, unspecified   . Anxiety   . Chronic maxillary sinusitis   . COPD (chronic obstructive pulmonary disease) ([place])   . Depression   . Diabetes mellitus   . Edema   . Hypercalcemia   . Hyperlipidemia   . Hypertension   . Hypertrophy of prostate without urinary obstruction and other lower urinary tract symptoms (LUTS)   . Hypertrophy of prostate without urinary obstruction and other lower urinary tract symptoms (LUTS)   . Kidney disease    mild  . Unspecified disorder of kidney and ureter   . Unspecified sleep apnea   . Ventral hernia, unspecified, without mention of obstruction or gangrene    Past Surgical History:  Procedure Laterality Date  . EXPLORATORY LAPAROTOMY  2002  . HERNIA REPAIR     Social History:   reports that he quit smoking about 29 years ago. His smoking use included cigarettes. He has a 45.00 pack-year smoking history. He has never used smokeless tobacco. He reports that he does not drink alcohol or use drugs.  Family History  Problem Relation Age of Onset  . Pancreatic cancer Sister   . Heart disease Father   . Hypertension Brother   . Hypertension Sister     Medications: Patient's Medications  New Prescriptions   No medications on file  Previous Medications   ALBUTEROL (PROVENTIL HFA;VENTOLIN HFA) 108 (90 BASE) MCG/ACT INHALER    Inhale 2 puffs into the lungs every 6 (six) hours as needed for wheezing or shortness  of breath.   AMLODIPINE (NORVASC) 10 MG TABLET    Take 1 tablet (10 mg total) by mouth daily.   ASPIRIN EC 81 MG TABLET    Take 1 tablet (81 mg total) by mouth daily.   ATORVASTATIN (LIPITOR) 20 MG TABLET    Take one tablet by mouth once daily for cholesterol   BUDESONIDE-FORMOTEROL (SYMBICORT) 160-4.5 MCG/ACT INHALER    Inhale 2 puffs into the lungs 2 (two) times daily.   DEXTROMETHORPHAN  (DELSYM) 30 MG/5ML LIQUID    Take 2.5 mLs (15 mg total) by mouth 2 (two) times daily as needed for cough.   DOXYCYCLINE (VIBRA-TABS) 100 MG TABLET    Take 1 tablet (100 mg total) by mouth 2 (two) times daily.   GLUCOSE BLOOD (ONE TOUCH ULTRA TEST) TEST STRIP    USE AS INSTRUCTED TO TEST BLOOD SUGAR ONCE DAILY DX E119   HYDRALAZINE (APRESOLINE) 25 MG TABLET    Take 1 tablet (25 mg total) by mouth 2 (two) times daily.   IPRATROPIUM-ALBUTEROL (DUONEB) 0.5-2.5 (3) MG/3ML SOLN    Take 3 mLs by nebulization every 6 (six) hours as needed (wheezing).   LANCET DEVICES (ONE TOUCH DELICA LANCING DEV) MISC    Use to test blood sugar once daily dx: E119   LANCETS (ONETOUCH ULTRASOFT) LANCETS    Use to test blood sugar up to three times daily. Dx E11.9   METOPROLOL TARTRATE (LOPRESSOR) 50 MG TABLET    Take 1 tablet (50 mg total) by mouth 2 (two) times daily.   MONTELUKAST (SINGULAIR) 10 MG TABLET    Take 1 tablet (10 mg total) by mouth at bedtime.   PAROXETINE (PAXIL) 20 MG TABLET    Take 1 tablet by mouth once daily for depression   POLYETHYLENE GLYCOL POWDER (GLYCOLAX/MIRALAX) POWDER    Take 17 g by mouth every 3 (three) days. For constipation; hold for loose stools   PREDNISONE (DELTASONE) 10 MG TABLET    Take 1 tablet (10 mg total) by mouth daily. 4 tablets daily x 2 days then 3 daily x 2 days then 2 daily x 2 days then 1 daily x 2 days   SITAGLIPTIN (JANUVIA) 25 MG TABLET    Take 1 tablet (25 mg total) by mouth daily.   TRAMADOL (ULTRAM) 50 MG TABLET    TAKE 1 TABLET BY MOUTH TWICE A DAY   TRAZODONE (DESYREL) 50 MG TABLET    Take 1 tablet (50 mg total) by mouth at bedtime.  Modified Medications   No medications on file  Discontinued Medications   No medications on file     Physical Exam:  Vitals:   04/16/18 0826  BP: 136/84  Pulse: 62  Temp: 97.8 F (36.6 C)  TempSrc: Oral  SpO2: 95%  Weight: 198 lb (89.8 kg)  Height: 5' 7" (1.702 m)   Body mass index is 31.01 kg/m.  Physical Exam    Constitutional: He appears well-developed and well-nourished.  HENT:  Head: Normocephalic and atraumatic.  Right Ear: External ear normal.  Left Ear: External ear normal.  Nose: Nose normal.  Mouth/Throat: Oropharynx is clear and moist. No oropharyngeal exudate.  Eyes: Pupils are equal, round, and reactive to light. Conjunctivae and EOM are normal.  Neck: Normal range of motion. Neck supple.  Cardiovascular: Normal rate and regular rhythm.  Pulmonary/Chest: Effort normal and breath sounds normal. He has no wheezes.  Abdominal: Soft. Bowel sounds are normal.  Lymphadenopathy:    He has no cervical adenopathy.  Skin: Skin is warm and dry.    Labs reviewed: Basic Metabolic Panel: Recent Labs    04/07/18 0114 04/08/18 0405 04/09/18 0330  NA 143 136 137  K 4.3 4.7 4.7  CL 105 108 108  CO2 _0 GLUCOSE 165* 213* 238*  BUN 44* 46* 62*  CREATININE 2.20* 2.38* 2.44*  CALCIUM 10.2 9.3 9.8   Liver Function Tests: Recent Labs    01/29/18 0809  AST 17  ALT 13  BILITOT 0.4  PROT 6.2   No results for input(s): LIPASE, AMYLASE in the last 8760 hours. No results for input(s): AMMONIA in the last 8760 hours. CBC: Recent Labs    10/01/17 1400 10/09/17 1137 01/29/18 0809 04/07/18 0114 04/08/18 0405 04/09/18 0330  WBC 16.8* 10.4 8.2 13.0* 11.6* 11.5*  NEUTROABS 12,550* 6,791 5,223  --   --   --   HGB 13.0* 12.4* 12.8* 13.4 11.8* 11.4*  HCT 37.5* 36.0* 38.3* 41.0 35.8* 34.6*  MCV 93.8 94.0 95.8 99.3 97.8 98.0  PLT 215 238 160 143* 130* 156   Lipid Panel: Recent Labs    06/06/17 0806 10/30/17 0859 01/29/18 0809  CHOL 173 170 166  HDL 50 48 50  LDLCALC 102* 101* 97  TRIG 116 118 91  CHOLHDL 3.5 3.5 3.3   TSH: No results for input(s): TSH in the last 8760 hours. A1C: Lab Results  Component Value Date   HGBA1C 6.2 (H) 01/29/2018     Assessment/Plan 1. COPD exacerbation ([place]) -significantly improved at this time. Continues to have cough and sputum  productions. mucinex DM by mouth BID with full glass of water for 7 days recommended.  - BMP with eGFR([facility]) - CBC with Differential/Platelets  2. Chronic kidney disease (CKD) stage G3a/A2, moderately decreased glomerular filtration rate (GFR) between 45-59 mL/min/1.73 square meter and albuminuria creatinine ratio between 30-299 mg/g (HCC) -will follow up lab today, encouraged hydration. Pt avoiding NSAIDs - BMP with eGFR([facility])  3. Essential hypertension Stable, off losartan. Will follow up lab at this time.   Next appt: 05/21/2018 Carlos American. [place], [facility] [phone]

## 2018-04-16 NOTE — Patient Instructions (Signed)
mucinex DM by mouth twice daily for 7 days with full glass of water for cough and congestion

## 2018-04-20 ENCOUNTER — Telehealth: Payer: Self-pay

## 2018-04-20 ENCOUNTER — Other Ambulatory Visit: Payer: Self-pay | Admitting: *Deleted

## 2018-04-20 DIAGNOSIS — R7989 Other specified abnormal findings of blood chemistry: Secondary | ICD-10-CM

## 2018-04-20 MED ORDER — LOSARTAN POTASSIUM 25 MG PO TABS: 25.0000 mg | ORAL_TABLET | Freq: Every day | ORAL | Status: DC

## 2018-04-20 NOTE — Patient Outreach (Addendum)
[name]) Care Management  04/20/2018  Ian [name] 30-May-1936 [id number]   Telephone Assessment   RN spoke with pt today and received information on his ongoing management of care. Pt reports today was a good day and he is doing "very good". Confirms he is taking all his prescribed medications and has not need to use his rescue inhaler since the last conversation with RN case manager last week. Pt has a sufficient supply with no needed refills or related issues. Pt also confirms attending all medical appointments with no needed transportation. RN reviewed the current plan of care and adjusted interventions according to pt's progress. Will continue to strongly encouraged adherence with all goals and follow up next week. RN continue to offer a home visit however pt again has opt to decline at this time. Strongly encourage pt adhere to the current plan of care and verified pt remains in the GREEN zone with his ongoing COPD. RN reiterated on the zones and attempted to receive feedback on pt's knowledge level. RN will continue to review and stress the importance of recognizing symptoms early with ongoing use of his inhalers and medications. Will continue to offer any needed community resources (pt declined) and follow up next week as discussed with pt today. No other inquires or request.   THN CM Care Plan Problem One     Most Recent Value  Care Plan Problem One  Hospital prevention related to COPD  Role Documenting the Problem One  Care Management [place] for Problem One  Active  THN Long Term Goal   Pt will not have a readmission related to COPD exacerbation over the next 60 days  THN Long Term Goal Start Date  04/10/18  Interventions for Problem One Long Term Goal  Reiterated on the s/s of COPD and confirmed pt remains in the GREEN zone with no acute symptoms encountered. Will continue to discuss prevention measures to avoid risk of hospitalization and verify pt's  adherence in managing her COPD.  THN CM Short Term Goal #1   pt will adhere to all prescribed discharge medications over the next 30 days  THN CM Short Term Goal #1 Start Date  04/10/18  [facility] CM Short Term Goal #1 Met Date  04/20/18  Interventions for Short Term Goal #1  Pt confirms he has all his edications but did not wish to review  THN CM Short Term Goal #2   Aderence to all medical appointments over the next 30 days  THN CM Short Term Goal #2 Start Date  04/10/18  Interventions for Short Term Goal #2  Will extend to allow ongoing adherence based upon the upcoming pending appointments.      Ian Mina, RN Care Management Coordinator [place] Office [phone]

## 2018-04-20 NOTE — Telephone Encounter (Signed)
Discussed lab results with patient and his son [name] (2 separate phone calls). Both verbalized understanding of results.  Per Sherrie Mustache, NP: pt is to resume losartan. Pt with elevated WBC- to repeat CBC on Monday 04/20/18. Pt to notify office if cough worsens or fever develops.  Scheduled appointment with patient to check cbc tomorrow, patient was unable to come today. Future order placed  Informed [name] who prepares medications of medication change. Med list updated. [name] will go to patients house and update pill box.   S.Chrae B/CMA

## 2018-04-21 ENCOUNTER — Other Ambulatory Visit: Payer: PPO

## 2018-04-21 DIAGNOSIS — R7989 Other specified abnormal findings of blood chemistry: Secondary | ICD-10-CM | POA: Diagnosis not present

## 2018-04-21 LAB — CBC
HCT: 40 % (ref 38.5–50.0)
HEMOGLOBIN: 13.7 g/dL (ref 13.2–17.1)
MCH: 32.9 pg (ref 27.0–33.0)
MCHC: 34.3 g/dL (ref 32.0–36.0)
MCV: 95.9 fL (ref 80.0–100.0)
MPV: 10.2 fL (ref 7.5–12.5)
PLATELETS: 171 10*3/uL (ref 140–400)
RBC: 4.17 10*6/uL — AB (ref 4.20–5.80)
RDW: 13.4 % (ref 11.0–15.0)
WBC: 12.2 10*3/uL — ABNORMAL HIGH (ref 3.8–10.8)

## 2018-04-24 ENCOUNTER — Telehealth: Payer: Self-pay | Admitting: *Deleted

## 2018-04-24 NOTE — Telephone Encounter (Signed)
Patient son, Ian [name] called and stated that patient's cough and chest cold is no better.Was seen by Janett Billow on 04/16/18. Stated that patient did not take his Prednisone as directed and has 7 pills left.Son stated that he thinks patient is taking the Mucinex as directed.  Doesn't want him to end back up in the hospital.   Son also feels like patient is depressed and wants to know what to do to address this. Please advise.

## 2018-04-26 NOTE — Telephone Encounter (Signed)
He seemed to be doing better during office visit and lung sounds were good. If he has any concerns would recommend follow up office visit and if he could come with him that would be beneficial - in regard to mood and cough

## 2018-04-27 NOTE — Telephone Encounter (Signed)
Patient son notified and agreed.  ?

## 2018-04-28 ENCOUNTER — Ambulatory Visit: Payer: PPO | Admitting: Nurse Practitioner

## 2018-05-04 ENCOUNTER — Other Ambulatory Visit: Payer: Self-pay | Admitting: *Deleted

## 2018-05-04 NOTE — Patient Outreach (Signed)
[facility]) Care Management  05/04/2018  Ian [name] 04/04/1936 [id number]    Telephone Assessment  RN attempted outreach call to pt today however unsuccessful and unable to leave a HIPAA voice message. Will completed another outreach call few weeks for ongoing [facility] services.  [name], RN Care Management Coordinator [place] Office [phone]

## 2018-05-11 ENCOUNTER — Other Ambulatory Visit: Payer: Self-pay | Admitting: *Deleted

## 2018-05-11 MED ORDER — GLUCOSE BLOOD VI STRP: ORAL_STRIP | 1 refills | Status: DC

## 2018-05-12 ENCOUNTER — Telehealth: Payer: Self-pay | Admitting: Pulmonary Disease

## 2018-05-12 MED ORDER — BUDESONIDE-FORMOTEROL FUMARATE 160-4.5 MCG/ACT IN AERO: 2.0000 | INHALATION_SPRAY | Freq: Two times a day (BID) | RESPIRATORY_TRACT | 0 refills | Status: DC

## 2018-05-12 NOTE — Telephone Encounter (Signed)
Spoke with patient in lobby in need of (2) symbicort 160 samples Asked pt if they are too expensive for him with insurance he stated yes. Offered pt asst forms for symb 160, papers have been printed. Pt has the forms, and will return them back to our office next week.  Routing message to [facility] to follow up with patient later next week.

## 2018-05-13 NOTE — Telephone Encounter (Signed)
Patient has all the assistant forms for symb 160 He will complete and return next week. Nothing further needed at this time.

## 2018-05-21 ENCOUNTER — Ambulatory Visit: Payer: PPO | Admitting: Internal Medicine

## 2018-05-21 IMAGING — DX DG ABD PORTABLE 1V
2 series · 2 of 2 positions shown · non-contrast
Comparison: 01/02/2017

CLINICAL DATA: Abdominal distention.  Small bowel obstruction.

EXAM:
PORTABLE ABDOMEN - 1 VIEW

[abdomen kub (1 of 2)]
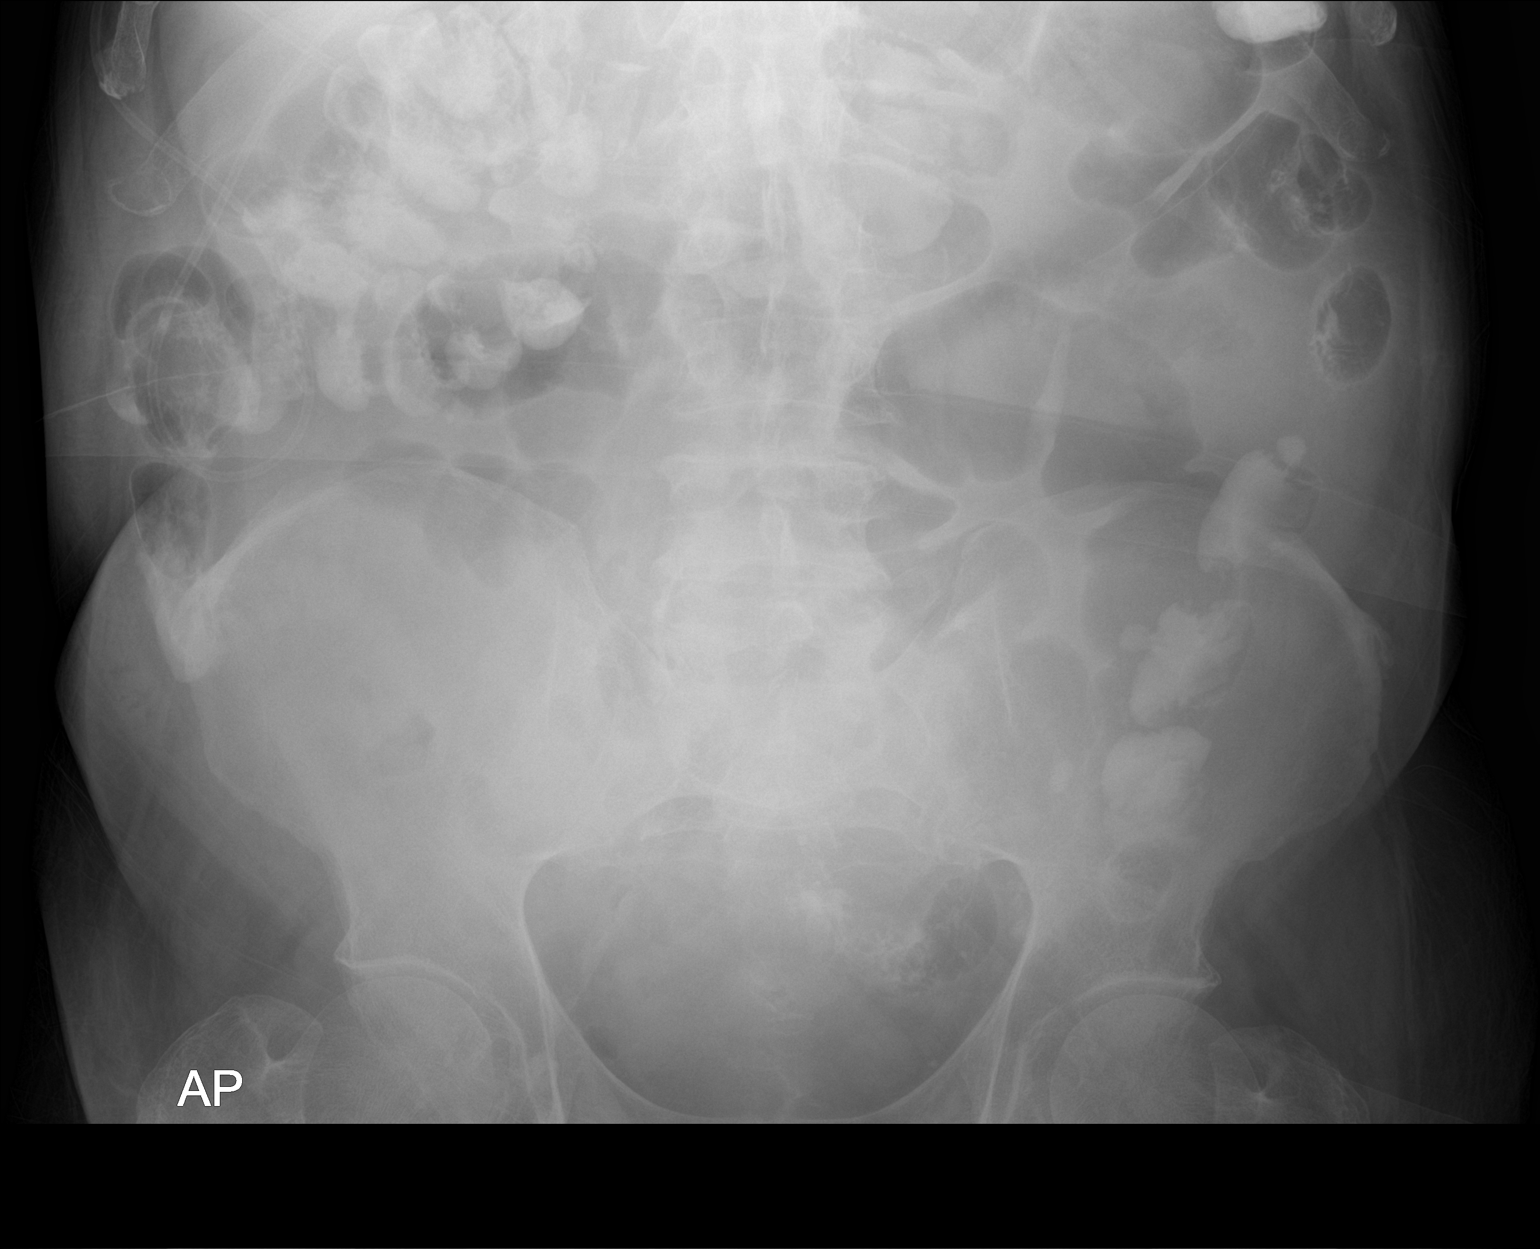

[abdomen kub (2 of 2)]
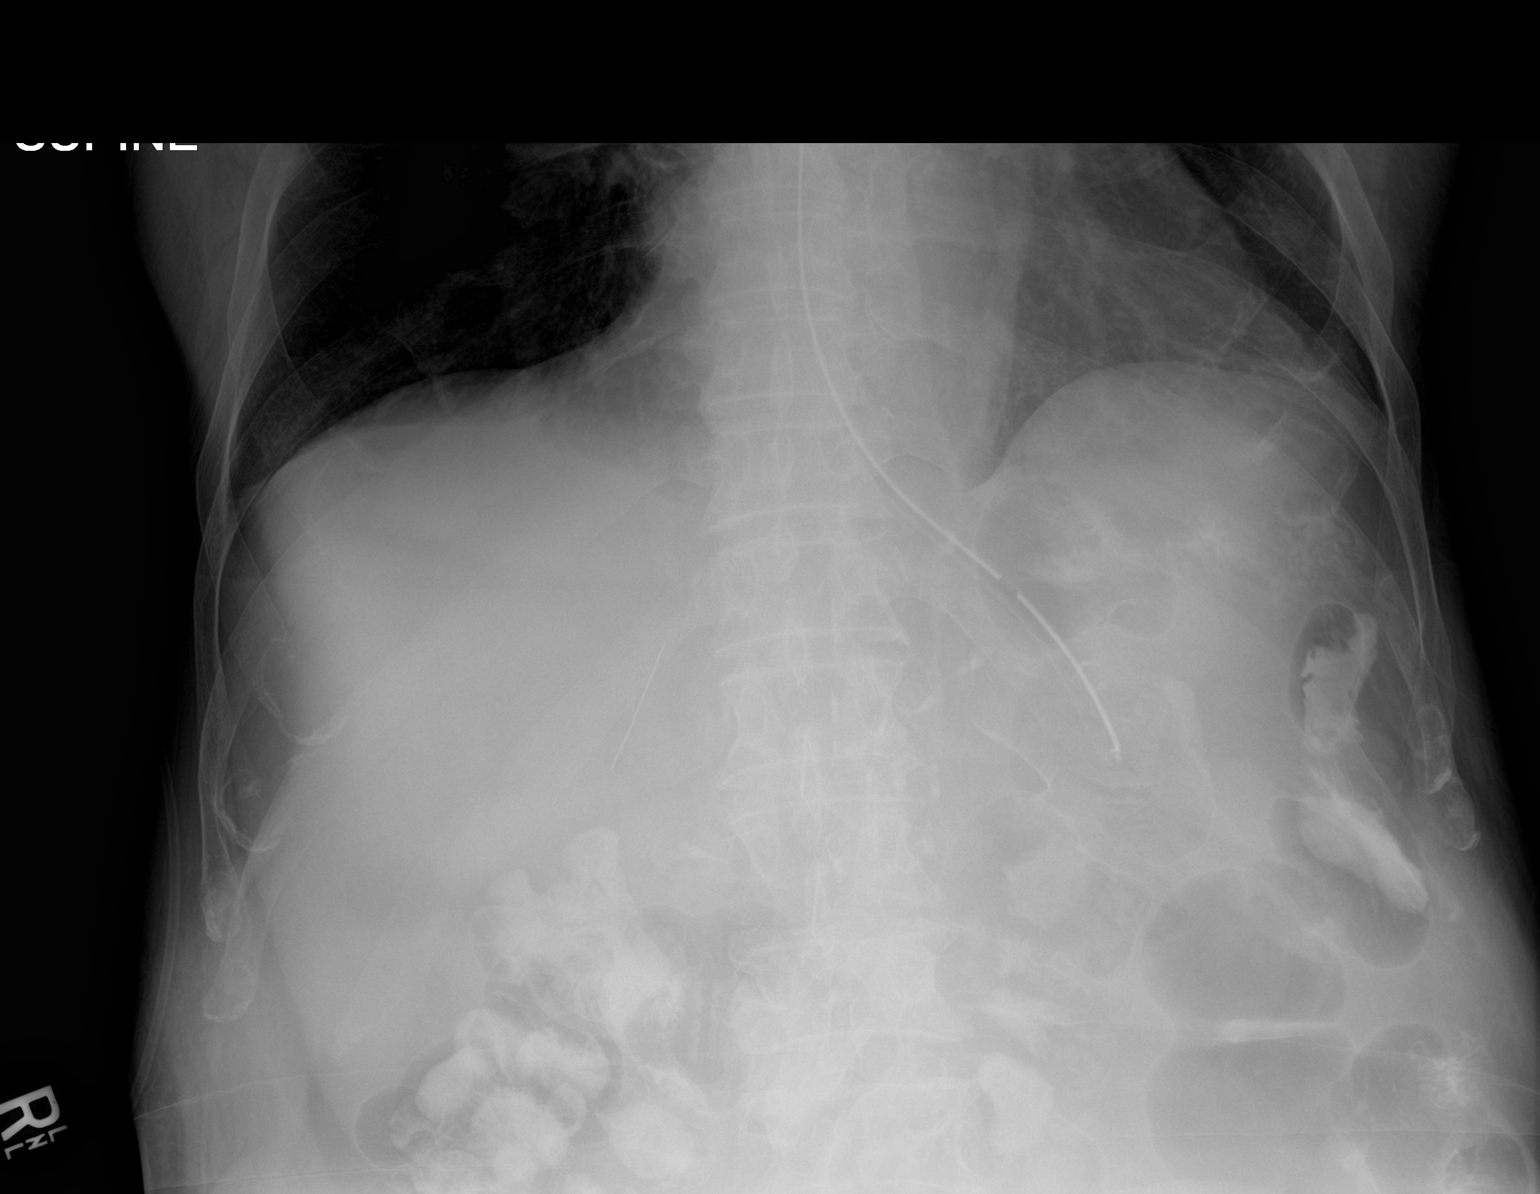

[2 of 2 positions shown; findings below may reference images not displayed]

FINDINGS: Small bowel dilation noted on the prior exams has essentially
resolved. Contrast is seen within a normal caliber colon.

Nasogastric tube is positioned within the mid stomach.
IMPRESSION: 1. No residual small bowel dilation. Contrast present within a
nondilated colon. Findings are consistent with a resolved partial
small bowel obstruction.

## 2018-05-22 ENCOUNTER — Telehealth: Payer: Self-pay

## 2018-05-22 ENCOUNTER — Other Ambulatory Visit: Payer: Self-pay | Admitting: *Deleted

## 2018-05-22 NOTE — Patient Outreach (Signed)
Ian [name] [facility]) Care Management  05/22/2018  Ian [name] [birth year] [id number]  Telephone Assessment  RN attempted to contact pt however unsuccessful and unable to leave a HIPAA approved voice message. Will send outreach letter and continue to reach other contacts noted via Epic.   [name], RN Care Management Coordinator [place] Office [phone]

## 2018-05-22 NOTE — Telephone Encounter (Signed)
Patient called regarding his Prednisone medication that was perscribed to him on 04/09/2018 when he was in the hospital.  I also spoke with patient son(Mike) and informed him that the prednisone was prescribed when the patient was discharged from the hospital on 04/09/2018 and that he was suppose to have started the medicine and completed it by now.     [name] verbalized understanding

## 2018-05-27 ENCOUNTER — Other Ambulatory Visit: Payer: Self-pay

## 2018-05-27 MED ORDER — BUDESONIDE-FORMOTEROL FUMARATE 160-4.5 MCG/ACT IN AERO: 2.0000 | INHALATION_SPRAY | Freq: Two times a day (BID) | RESPIRATORY_TRACT | 5 refills | Status: DC

## 2018-05-28 ENCOUNTER — Other Ambulatory Visit: Payer: Self-pay | Admitting: *Deleted

## 2018-05-28 NOTE — Patient Outreach (Signed)
[place] [facility]) Care Management  05/28/2018  Ian [name] Apr 20, 1936 [id number]    Telephone Assessment  RN attempted once again to reach out to the pt and contacted both the pt and son Ian [name]) contact number however only able to leave a HIPAA approved voice message requesting a call back. Will further engage at that time. Note no response to the outreach letter sent to this pt. Based upon no response will close this case from further engagement at this time.  [name], RN Care Management Coordinator [place] Office [phone]

## 2018-06-11 DIAGNOSIS — N2581 Secondary hyperparathyroidism of renal origin: Secondary | ICD-10-CM | POA: Diagnosis not present

## 2018-06-11 DIAGNOSIS — N183 Chronic kidney disease, stage 3 (moderate): Secondary | ICD-10-CM | POA: Diagnosis not present

## 2018-06-22 ENCOUNTER — Ambulatory Visit: Payer: PPO | Admitting: Pulmonary Disease

## 2018-06-22 ENCOUNTER — Encounter: Payer: Self-pay | Admitting: Pulmonary Disease

## 2018-06-22 VITALS — BP 132/58 | HR 59 | Ht 66.5 in | Wt 203.0 lb

## 2018-06-22 DIAGNOSIS — J449 Chronic obstructive pulmonary disease, unspecified: Secondary | ICD-10-CM

## 2018-06-22 DIAGNOSIS — J302 Other seasonal allergic rhinitis: Secondary | ICD-10-CM

## 2018-06-22 MED ORDER — BUDESONIDE-FORMOTEROL FUMARATE 160-4.5 MCG/ACT IN AERO: 2.0000 | INHALATION_SPRAY | Freq: Two times a day (BID) | RESPIRATORY_TRACT | 0 refills | Status: DC

## 2018-06-22 NOTE — Progress Notes (Signed)
@Patient  ID: Ian [name], male    DOB: 1935/12/04, 82 y.o.   MRN: [id number]  Chief Complaint  Patient presents with  . Follow-up    6 mo follow up for COPD    Referring provider: Gayland Curry, DO  HPI:  82 year old male former smoker followed in our office for COPD with asthmatic component  Smoker/ Smoking History: Former smoker. Quit 1990. 45 pack year smoking history.  Maintenance: Symbicort 160 Pt of: Dr. Halford Chessman  06/22/2018  - Visit   82 year old male patient presenting today for six-month follow-up with our office.  Patient reports his breathing has been stable with no new concerns or issues.  Patient has already received his flu vaccine.  Patient reports he is only had uses rescue inhaler about once a month.  Patient continues to be on Symbicort 160 and is requesting samples of Symbicort 160 if we have any today.  Patient denies new complaints or concerns.  Patient does report that last month he had to take Mucinex for a few days as he felt more congested but those symptoms have since resolved.   Tests:   FENO:  No results found for: NITRICOXIDE  PFT: PFT Results Latest Ref Rng & Units 01/01/2018  FVC-Pre L 2.13  FVC-Predicted Pre % 60  Pre FEV1/FVC % % 63  FEV1-Pre L 1.34  FEV1-Predicted Pre % 54  DLCO UNC% % 60  DLCO COR %Predicted % 92    Imaging: No results found.  Chart Review:    Specialty Problems      Pulmonary Problems   COPD with asthma ([place])   Seasonal allergic rhinitis   Acute upper respiratory infection   Cough   Acute on chronic respiratory failure with hypoxia (HCC)   COPD exacerbation (HCC)      Allergies  Allergen Reactions  . Ace Inhibitors Cough    Immunization History  Administered Date(s) Administered  . Influenza Split 04/08/2011  . Influenza Whole 04/07/2012  . Influenza, High Dose Seasonal PF 04/07/2017, 05/25/2018  . Influenza,inj,Quad PF,6+ Mos 04/23/2013, 03/28/2014, 02/23/2016  . Influenza-Unspecified 04/08/2015,  07/09/2015, 04/08/2017  . Pneumococcal Conjugate-13 06/06/2014  . Pneumococcal-Unspecified 07/09/2007  . Td 08/09/1997  . Tdap 08/12/2011   u2d  Past Medical History:  Diagnosis Date  . Allergy   . Alzheimer's disease ([place])   . Anemia, unspecified   . Anxiety   . Chronic maxillary sinusitis   . COPD (chronic obstructive pulmonary disease) ([place])   . Depression   . Diabetes mellitus   . Edema   . Hypercalcemia   . Hyperlipidemia   . Hypertension   . Hypertrophy of prostate without urinary obstruction and other lower urinary tract symptoms (LUTS)   . Hypertrophy of prostate without urinary obstruction and other lower urinary tract symptoms (LUTS)   . Kidney disease    mild  . Unspecified disorder of kidney and ureter   . Unspecified sleep apnea   . Ventral hernia, unspecified, without mention of obstruction or gangrene     Tobacco History: Social History   Tobacco Use  Smoking Status Former Smoker  . Packs/day: 1.50  . Years: 30.00  . Pack years: 45.00  . Types: Cigarettes  . Last attempt to quit: 07/08/1988  . Years since quitting: 29.9  Smokeless Tobacco Never Used   Counseling given: Yes  Continue not smoking   Outpatient Encounter Medications as of 06/22/2018  Medication Sig  . albuterol (PROVENTIL HFA;VENTOLIN HFA) 108 (90 Base) MCG/ACT inhaler Inhale 2 puffs into  the lungs every 6 (six) hours as needed for wheezing or shortness of breath.  Marland Kitchen amLODipine (NORVASC) 10 MG tablet Take 1 tablet (10 mg total) by mouth daily.  Marland Kitchen aspirin EC 81 MG tablet Take 1 tablet (81 mg total) by mouth daily.  Marland Kitchen atorvastatin (LIPITOR) 20 MG tablet Take one tablet by mouth once daily for cholesterol  . budesonide-formoterol (SYMBICORT) 160-4.5 MCG/ACT inhaler Inhale 2 puffs into the lungs 2 (two) times daily.  Marland Kitchen dextromethorphan (DELSYM) 30 MG/5ML liquid Take 2.5 mLs (15 mg total) by mouth 2 (two) times daily as needed for cough.  Marland Kitchen glucose blood (ONE TOUCH ULTRA TEST) test strip  USE AS INSTRUCTED TO TEST BLOOD SUGAR ONCE DAILY DX E119  . hydrALAZINE (APRESOLINE) 25 MG tablet Take 1 tablet (25 mg total) by mouth 2 (two) times daily.  Marland Kitchen ipratropium-albuterol (DUONEB) 0.5-2.5 (3) MG/3ML SOLN Take 3 mLs by nebulization every 6 (six) hours as needed (wheezing).  [place] Guise Devices (ONE TOUCH DELICA LANCING DEV) MISC Use to test blood sugar once daily dx: E119  . Lancets (ONETOUCH ULTRASOFT) lancets Use to test blood sugar up to three times daily. Dx E11.9  . losartan (COZAAR) 25 MG tablet Take 1 tablet (25 mg total) by mouth daily.  . metoprolol tartrate (LOPRESSOR) 50 MG tablet Take 1 tablet (50 mg total) by mouth 2 (two) times daily.  . montelukast (SINGULAIR) 10 MG tablet Take 1 tablet (10 mg total) by mouth at bedtime.  Marland Kitchen PARoxetine (PAXIL) 20 MG tablet Take 1 tablet by mouth once daily for depression  . polyethylene glycol powder (GLYCOLAX/MIRALAX) powder Take 17 g by mouth every 3 (three) days. For constipation; hold for loose stools  . sitaGLIPtin (JANUVIA) 25 MG tablet Take 1 tablet (25 mg total) by mouth daily.  . traMADol (ULTRAM) 50 MG tablet TAKE 1 TABLET BY MOUTH TWICE A DAY  . traZODone (DESYREL) 50 MG tablet Take 1 tablet (50 mg total) by mouth at bedtime.  . [DISCONTINUED] predniSONE (DELTASONE) 10 MG tablet Take 1 tablet (10 mg total) by mouth daily. 4 tablets daily x 2 days then 3 daily x 2 days then 2 daily x 2 days then 1 daily x 2 days  . budesonide-formoterol (SYMBICORT) 160-4.5 MCG/ACT inhaler Inhale 2 puffs into the lungs 2 (two) times daily.   No facility-administered encounter medications on file as of 06/22/2018.      Review of Systems  Review of Systems  Constitutional: Positive for fatigue (Baseline). Negative for activity change, chills, fever and unexpected weight change.  HENT: Negative for postnasal drip, rhinorrhea, sinus pressure, sinus pain, sneezing and sore throat.   Eyes: Negative.   Respiratory: Positive for cough (occasionally  white mucus when he takes mucinex, this is since resolved stable). Negative for shortness of breath and wheezing.   Cardiovascular: Negative for chest pain and palpitations.  Gastrointestinal: Negative for constipation, diarrhea, nausea and vomiting.  Endocrine: Negative.   Musculoskeletal: Negative.   Skin: Negative.   Neurological: Negative for dizziness and headaches.  Psychiatric/Behavioral: Negative.  Negative for dysphoric mood. The patient is not nervous/anxious.   All other systems reviewed and are negative.    Physical Exam  BP (!) 132/58 (BP Location: Left Arm, Cuff Size: Normal)   Pulse (!) 59   Ht 5' 6.5" (1.689 m)   Wt 203 lb (92.1 kg)   SpO2 91%   BMI 32.27 kg/m   Wt Readings from Last 5 Encounters:  06/22/18 203 lb (92.1 kg)  04/16/18  198 lb (89.8 kg)  04/07/18 194 lb 14.2 oz (88.4 kg)  04/06/18 199 lb (90.3 kg)  03/05/18 195 lb 12.8 oz (88.8 kg)    Physical Exam  Constitutional: He is oriented to person, place, and time and well-developed, well-nourished, and in no distress. No distress.  HENT:  Head: Normocephalic and atraumatic.  Right Ear: Hearing, tympanic membrane, external ear and ear canal normal.  Left Ear: Hearing, tympanic membrane, external ear and ear canal normal.  Nose: Nose normal. Right sinus exhibits no maxillary sinus tenderness and no frontal sinus tenderness. Left sinus exhibits no maxillary sinus tenderness and no frontal sinus tenderness.  Mouth/Throat: Uvula is midline and oropharynx is clear and moist. No oropharyngeal exudate.  Eyes: Pupils are equal, round, and reactive to light.  Neck: Normal range of motion. Neck supple.  Cardiovascular: Normal rate, regular rhythm and normal heart sounds.  Pulmonary/Chest: Effort normal. No accessory muscle usage. No respiratory distress. He has no decreased breath sounds. He has wheezes (slight exp wheeze ). He has no rhonchi.  Abdominal: Soft. Bowel sounds are normal. There is no abdominal  tenderness.  Musculoskeletal: Normal range of motion.        General: No edema.  Lymphadenopathy:    He has no cervical adenopathy.  Neurological: He is alert and oriented to person, place, and time. Gait normal.  Skin: Skin is warm and dry. He is not diaphoretic. No erythema.  Psychiatric: Mood, memory, affect and judgment normal.  Nursing note and vitals reviewed.     Lab Results:  CBC    Component Value Date/Time   WBC 12.2 (H) 04/21/2018 0957   RBC 4.17 (L) 04/21/2018 0957   HGB 13.7 04/21/2018 0957   HGB 12.6 12/11/2015 0821   HCT 40.0 04/21/2018 0957   HCT 37.8 12/11/2015 0821   PLT 171 04/21/2018 0957   PLT 153 12/11/2015 0821   MCV 95.9 04/21/2018 0957   MCV 96 12/11/2015 0821   MCH 32.9 04/21/2018 0957   MCHC 34.3 04/21/2018 0957   RDW 13.4 04/21/2018 0957   RDW 15.7 (H) 12/11/2015 0821   LYMPHSABS 1,605 04/16/2018 0858   LYMPHSABS 2.7 12/11/2015 0821   MONOABS 900 01/10/2017 1007   EOSABS 360 04/16/2018 0858   EOSABS 0.3 12/11/2015 0821   BASOSABS 75 04/16/2018 0858   BASOSABS 0.1 12/11/2015 0821    BMET    Component Value Date/Time   NA 138 04/16/2018 0858   NA 140 04/21/2017   K 4.8 04/16/2018 0858   CL 102 04/16/2018 0858   CO2 31 04/16/2018 0858   GLUCOSE 234 (H) 04/16/2018 0858   BUN 44 (H) 04/16/2018 0858   BUN 35 (A) 04/21/2017   CREATININE 2.12 (H) 04/16/2018 0858   CALCIUM 9.8 04/16/2018 0858   GFRNONAA 29 (L) 04/16/2018 0858   GFRAA 33 (L) 04/16/2018 0858    BNP    Component Value Date/Time   BNP 436.2 (H) 04/07/2018 0242    ProBNP    Component Value Date/Time   PROBNP 788.4 (H) 01/01/2013 0900      Assessment & Plan:   Pleasant 82 year old male patient completing follow-up with our office today.  Patient is doing well we will continue patient on Symbicort 160.  Sample provided today.  Patient has slight expiratory wheeze but is asymptomatic.  Informed patient to use rescue inhaler or DuoNeb's at home if he feels short of  breath or hears audible wheezing.  Patient agrees.  Patient to follow-up with our office in  6 months or sooner if symptoms worsen.  COPD with asthma ([place]) Continue Symbicort 160 >>> 2 puffs in the morning right when you wake up, rinse out your mouth after use, 12 hours later 2 puffs, rinse after use >>> Take this daily, no matter what >>> This is not a rescue inhaler   Only use your albuterol as a rescue medication to be used if you can't catch your breath by resting or doing a relaxed purse lip breathing pattern.  - The less you use it, the better it will work when you need it. - Ok to use up to 2 puffs  every 4 hours if you must but call for immediate appointment if use goes up over your usual need - Don't leave home without it !!  (think of it like the spare tire for your car)   Can use Duoneb nebulized medication every 6 hours as need for shortness of breath or wheezing   Follow up in 6 months with Dr. Halford Chessman or Wyn Quaker FNP  >>>follow up sooner if symptoms worsen or rescue inhaler use increases   Seasonal allergic rhinitis Continue Singulair daily      Lauraine Rinne, NP 06/22/2018

## 2018-06-22 NOTE — Progress Notes (Signed)
Reviewed and agree with assessment/plan.   Monay Houlton, MD [facility] 07/03/2016, 12:24 PM Pager:  [phone]  

## 2018-06-22 NOTE — Assessment & Plan Note (Signed)
Continue Singulair daily

## 2018-06-22 NOTE — Assessment & Plan Note (Signed)
Continue Symbicort 160 >>> 2 puffs in the morning right when you wake up, rinse out your mouth after use, 12 hours later 2 puffs, rinse after use >>> Take this daily, no matter what >>> This is not a rescue inhaler   Only use your albuterol as a rescue medication to be used if you can't catch your breath by resting or doing a relaxed purse lip breathing pattern.  - The less you use it, the better it will work when you need it. - Ok to use up to 2 puffs  every 4 hours if you must but call for immediate appointment if use goes up over your usual need - Don't leave home without it !!  (think of it like the spare tire for your car)   Can use Duoneb nebulized medication every 6 hours as need for shortness of breath or wheezing   Follow up in 6 months with Dr. Halford Chessman or Wyn Quaker FNP  >>>follow up sooner if symptoms worsen or rescue inhaler use increases

## 2018-06-22 NOTE — Patient Instructions (Signed)
Continue Symbicort 160 >>> 2 puffs in the morning right when you wake up, rinse out your mouth after use, 12 hours later 2 puffs, rinse after use >>> Take this daily, no matter what >>> This is not a rescue inhaler   Only use your albuterol as a rescue medication to be used if you can't catch your breath by resting or doing a relaxed purse lip breathing pattern.  - The less you use it, the better it will work when you need it. - Ok to use up to 2 puffs  every 4 hours if you must but call for immediate appointment if use goes up over your usual need - Don't leave home without it !!  (think of it like the spare tire for your car)   Can use Duoneb nebulized medication every 6 hours as need for shortness of breath or wheezing   Follow up in 6 months with Dr. Halford Chessman or Wyn Quaker FNP  >>>follow up sooner if symptoms worsen or rescue inhaler use increases      It is flu season:   >>>Remember to be washing your hands regularly, using hand sanitizer, be careful to use around herself with has contact with people who are sick will increase her chances of getting sick yourself. >>> Best ways to protect herself from the flu: Receive the yearly flu vaccine, practice good hand hygiene washing with soap and also using hand sanitizer when available, eat a nutritious meals, get adequate rest, hydrate appropriately   Please contact the office if your symptoms worsen or you have concerns that you are not improving.   Thank you for choosing [facility] for your healthcare, and for allowing [place] to partner with you on your healthcare journey. I am thankful to be able to provide care to you today.   Wyn Quaker FNP-C

## 2018-06-25 DIAGNOSIS — N2581 Secondary hyperparathyroidism of renal origin: Secondary | ICD-10-CM | POA: Diagnosis not present

## 2018-06-25 DIAGNOSIS — I129 Hypertensive chronic kidney disease with stage 1 through stage 4 chronic kidney disease, or unspecified chronic kidney disease: Secondary | ICD-10-CM | POA: Diagnosis not present

## 2018-06-25 DIAGNOSIS — D631 Anemia in chronic kidney disease: Secondary | ICD-10-CM | POA: Diagnosis not present

## 2018-06-25 DIAGNOSIS — N183 Chronic kidney disease, stage 3 (moderate): Secondary | ICD-10-CM | POA: Diagnosis not present

## 2018-07-06 ENCOUNTER — Ambulatory Visit (INDEPENDENT_AMBULATORY_CARE_PROVIDER_SITE_OTHER): Payer: PPO

## 2018-07-06 ENCOUNTER — Telehealth: Payer: Self-pay | Admitting: Pulmonary Disease

## 2018-07-06 VITALS — BP 142/66 | HR 52 | Temp 98.3°F | Ht 67.0 in | Wt 200.0 lb

## 2018-07-06 DIAGNOSIS — Z0000 Encounter for general adult medical examination without abnormal findings: Secondary | ICD-10-CM | POA: Diagnosis not present

## 2018-07-06 MED ORDER — ZOSTER VAC RECOMB ADJUVANTED 50 MCG/0.5ML IM SUSR: 0.5000 mL | Freq: Once | INTRAMUSCULAR | 1 refills | Status: AC

## 2018-07-06 NOTE — Progress Notes (Addendum)
Subjective:   Marshon Bangs is a 82 y.o. male who presents for Medicare Annual/Subsequent preventive examination.  Last AWV-07/02/2017    Objective:    Vitals: BP (!) 142/66 (BP Location: Left Arm, Patient Position: Sitting)   Pulse (!) 52   Temp 98.3 F (36.8 C) (Oral)   Ht 5\' 7"  (1.702 m)   Wt 200 lb (90.7 kg)   SpO2 91%   BMI 31.32 kg/m   Body mass index is 31.32 kg/m.  Advanced Directives 07/06/2018 04/16/2018 04/07/2018 04/07/2018 04/06/2018 03/05/2018 12/16/2017  Does Patient Have a Medical Advance Directive? Yes Yes Yes Yes Yes Yes Yes  Type of Paramedic of [place];Living will Living will [place];Living will [place];Living will [place];Living will [place];Living will [place]  Does patient want to make changes to medical advance directive? No - Patient declined - No - Patient declined - - - -  Copy of [place] in Chart? Yes - validated most recent copy scanned in chart (See row information) - - Yes Yes Yes Yes  Pre-existing out of facility DNR order (yellow form or pink MOST form) - - - - - - -    Tobacco Social History   Tobacco Use  Smoking Status Former Smoker  . Packs/day: 1.50  . Years: 30.00  . Pack years: 45.00  . Types: Cigarettes  . Last attempt to quit: 07/08/1988  . Years since quitting: 30.0  Smokeless Tobacco Never Used     Counseling given: Not Answered   Clinical Intake:  Pre-visit preparation completed: No  Pain : No/denies pain     Diabetes: No  How often do you need to have someone help you when you read instructions, pamphlets, or other written materials from your doctor or pharmacy?: 1 - Never What is the last grade level you completed in school?: Some college  Interpreter Needed?: No  Information entered by :: [name], RN  Past Medical History:  Diagnosis Date  . Allergy   .  Alzheimer's disease ([place])   . Anemia, unspecified   . Anxiety   . Chronic maxillary sinusitis   . COPD (chronic obstructive pulmonary disease) ([place])   . Depression   . Diabetes mellitus   . Edema   . Hypercalcemia   . Hyperlipidemia   . Hypertension   . Hypertrophy of prostate without urinary obstruction and other lower urinary tract symptoms (LUTS)   . Hypertrophy of prostate without urinary obstruction and other lower urinary tract symptoms (LUTS)   . Kidney disease    mild  . Unspecified disorder of kidney and ureter   . Unspecified sleep apnea   . Ventral hernia, unspecified, without mention of obstruction or gangrene    Past Surgical History:  Procedure Laterality Date  . EXPLORATORY LAPAROTOMY  2002  . HERNIA REPAIR     Family History  Problem Relation Age of Onset  . Pancreatic cancer Sister   . Heart disease Father   . Hypertension Brother   . Hypertension Sister    Social History   Socioeconomic History  . Marital status: Married    Spouse name: Not on file  . Number of children: 2  . Years of education: Not on file  . Highest education level: Not on file  Occupational History  . Occupation: retired    Comment: [place]  . Financial resource strain: Not hard at all  .  Food insecurity:    Worry: Never true    Inability: Never true  . Transportation needs:    Medical: No    Non-medical: No  Tobacco Use  . Smoking status: Former Smoker    Packs/day: 1.50    Years: 30.00    Pack years: 45.00    Types: Cigarettes    Last attempt to quit: 07/08/1988    Years since quitting: 30.0  . Smokeless tobacco: Never Used  Substance and Sexual Activity  . Alcohol use: No  . Drug use: No  . Sexual activity: Not Currently  Lifestyle  . Physical activity:    Days per week: 4 days    Minutes per session: 30 min  . Stress: Only a little  Relationships  . Social connections:    Talks on phone: More than three times a week    Gets together:  More than three times a week    Attends religious service: More than 4 times per year    Active member of club or organization: Yes    Attends meetings of clubs or organizations: Never    Relationship status: Married  Other Topics Concern  . Not on file  Social History Narrative  . Not on file    Outpatient Encounter Medications as of 07/06/2018  Medication Sig  . albuterol (PROVENTIL HFA;VENTOLIN HFA) 108 (90 Base) MCG/ACT inhaler Inhale 2 puffs into the lungs every 6 (six) hours as needed for wheezing or shortness of breath.  Marland Kitchen amLODipine (NORVASC) 10 MG tablet Take 1 tablet (10 mg total) by mouth daily.  Marland Kitchen aspirin EC 81 MG tablet Take 1 tablet (81 mg total) by mouth daily.  Marland Kitchen atorvastatin (LIPITOR) 20 MG tablet Take one tablet by mouth once daily for cholesterol  . budesonide-formoterol (SYMBICORT) 160-4.5 MCG/ACT inhaler Inhale 2 puffs into the lungs 2 (two) times daily.  Marland Kitchen dextromethorphan (DELSYM) 30 MG/5ML liquid Take 2.5 mLs (15 mg total) by mouth 2 (two) times daily as needed for cough.  Marland Kitchen glucose blood (ONE TOUCH ULTRA TEST) test strip USE AS INSTRUCTED TO TEST BLOOD SUGAR ONCE DAILY DX E119  . hydrALAZINE (APRESOLINE) 25 MG tablet Take 1 tablet (25 mg total) by mouth 2 (two) times daily.  Marland Kitchen ipratropium-albuterol (DUONEB) 0.5-2.5 (3) MG/3ML SOLN Take 3 mLs by nebulization every 6 (six) hours as needed (wheezing).  Elmore Guise Devices (ONE TOUCH DELICA LANCING DEV) MISC Use to test blood sugar once daily dx: E119  . Lancets (ONETOUCH ULTRASOFT) lancets Use to test blood sugar up to three times daily. Dx E11.9  . losartan (COZAAR) 25 MG tablet Take 1 tablet (25 mg total) by mouth daily.  . metoprolol tartrate (LOPRESSOR) 50 MG tablet Take 1 tablet (50 mg total) by mouth 2 (two) times daily.  . montelukast (SINGULAIR) 10 MG tablet Take 1 tablet (10 mg total) by mouth at bedtime.  Marland Kitchen PARoxetine (PAXIL) 20 MG tablet Take 1 tablet by mouth once daily for depression  . polyethylene glycol  powder (GLYCOLAX/MIRALAX) powder Take 17 g by mouth every 3 (three) days. For constipation; hold for loose stools  . sitaGLIPtin (JANUVIA) 25 MG tablet Take 1 tablet (25 mg total) by mouth daily.  . traMADol (ULTRAM) 50 MG tablet TAKE 1 TABLET BY MOUTH TWICE A DAY  . traZODone (DESYREL) 50 MG tablet Take 1 tablet (50 mg total) by mouth at bedtime.  Marland Kitchen Zoster Vaccine Adjuvanted [facility]) injection Inject 0.5 mLs into the muscle once for 1 dose.  . [DISCONTINUED]  Zoster Vaccine Adjuvanted [facility]) injection Inject 0.5 mLs into the muscle once.  . [DISCONTINUED] budesonide-formoterol (SYMBICORT) 160-4.5 MCG/ACT inhaler Inhale 2 puffs into the lungs 2 (two) times daily.   No facility-administered encounter medications on file as of 07/06/2018.     Activities of Daily Living In your present state of health, do you have any difficulty performing the following activities: 07/06/2018 04/13/2018  Hearing? Tempie Donning  Vision? N N  Difficulty concentrating or making decisions? N N  Walking or climbing stairs? N N  Dressing or bathing? N N  Doing errands, shopping? N N  Preparing Food and eating ? N N  Using the Toilet? N N  In the past six months, have you accidently leaked urine? N N  Do you have problems with loss of bowel control? N N  Managing your Medications? N N  Managing your Finances? N Y  Housekeeping or managing your Housekeeping? N Y  Some recent data might be hidden    Patient Care Team: Gayland Curry, DO as PCP - General (Geriatric Medicine) Zebedee Iba., MD as Referring Physician (Ophthalmology) Chesley Mires, MD as Consulting Physician (Pulmonary Disease)   Assessment:   This is a routine wellness examination for [place].  Exercise Activities and Dietary recommendations Current Exercise Habits: Home exercise routine, Type of exercise: treadmill;strength training/weights;stretching, Time (Minutes): 30, Frequency (Times/Week): 4, Weekly Exercise (Minutes/Week): 120, Intensity:  Mild, Exercise limited by: None identified  Goals    . increase water     Patient will drink 6 8 oz glasses a day.       Fall Risk Fall Risk  07/06/2018 04/16/2018 04/13/2018 04/06/2018 01/22/2018  Falls in the past year? 0 No No No No  Number falls in past yr: - - - - -   Is the patient's home free of loose throw rugs in walkways, pet beds, electrical cords, etc?   yes      Grab bars in the bathroom? yes      Handrails on the stairs?   yes      Adequate lighting?   yes  Depression Screen PHQ 2/9 Scores 07/06/2018 04/13/2018 01/22/2018 07/03/2017  PHQ - 2 Score 0 1 0 0    Cognitive Function MMSE - Mini Mental State Exam 07/06/2018 07/02/2017 06/03/2016 05/12/2015  Orientation to time 5 5 4 4   Orientation to Place 5 5 5 5   Registration 3 3 3 3   Attention/ Calculation 5 5 5 5   Recall 2 1 1 2   Language- name 2 objects 2 2 2 2   Language- repeat 1 1 1 1   Language- follow 3 step command 3 3 3 3   Language- read & follow direction 1 1 1 1   Write a sentence 1 1 1 1   Copy design 0 0 0 1  Total score 28 27 26 28         Immunization History  Administered Date(s) Administered  . Influenza Split 04/08/2011  . Influenza Whole 04/07/2012  . Influenza, High Dose Seasonal PF 04/07/2017, 05/25/2018  . Influenza,inj,Quad PF,6+ Mos 04/23/2013, 03/28/2014, 02/23/2016  . Influenza-Unspecified 04/08/2015, 07/09/2015, 04/08/2017  . Pneumococcal Conjugate-13 06/06/2014  . Pneumococcal-Unspecified 07/09/2007  . Td 08/09/1997  . Tdap 08/12/2011    Qualifies for Shingles Vaccine? Due, ordered to pharmacy  Screening Tests Health Maintenance  Topic Date Due  . OPHTHALMOLOGY EXAM  08/27/2015  . FOOT EXAM  07/03/2018  . HEMOGLOBIN A1C  08/01/2018  . TETANUS/TDAP  08/11/2021  . INFLUENZA VACCINE  Completed  .  PNA vac Low Risk Adult  Completed   Cancer Screenings: Lung: Low Dose CT Chest recommended if Age 14-80 years, 30 pack-year currently smoking OR have quit w/in 15years. Patient does  not qualify. Colorectal: up to date  Additional Screenings:  Hepatitis C Screening:declined  Sees Dr. Susa Simmonds for annual eye exams- called & records requested  Plan:    I have personally reviewed and addressed the Medicare Annual Wellness questionnaire and have noted the following in the patient's chart:  A. Medical and social history B. Use of alcohol, tobacco or illicit drugs  C. Current medications and supplements D. Functional ability and status E.  Nutritional status F.  Physical activity G. Advance directives H. List of other physicians I.  Hospitalizations, surgeries, and ER visits in previous 12 months J.  [place] to include hearing, vision, cognitive, depression L. Referrals and appointments - none  In addition, I have reviewed and discussed with patient certain preventive protocols, quality metrics, and best practice recommendations. A written personalized care plan for preventive services as well as general preventive health recommendations were provided to patient.  See attached scanned questionnaire for additional information.   Signed,   [name], RN Nurse Health Advisor  Patient concerns: None

## 2018-07-06 NOTE — Patient Instructions (Addendum)
[name] , Thank you for taking time to come for your Medicare Wellness Visit. I appreciate your ongoing commitment to your health goals. Please review the following plan we discussed and let me know if I can assist you in the future.   Screening recommendations/referrals: Colonoscopy excluded, over age 80 Recommended yearly ophthalmology/optometry visit for glaucoma screening and checkup Recommended yearly dental visit for hygiene and checkup  Vaccinations: Influenza vaccine up to date Pneumococcal vaccine up to date, completed Tdap vaccine up to date, due 08/11/2021 Shingles vaccine due, ordered to pharmacy. They will call you when it's ready    Advanced directives: In chart  Conditions/risks identified: none  Next appointment: Medicare Wellness Visit 07/12/2019 @ 10am  Preventive Care 80 Years and Older, Male Preventive care refers to lifestyle choices and visits with your health care provider that can promote health and wellness. What does preventive care include?  A yearly physical exam. This is also called an annual well check.  Dental exams once or twice a year.  Routine eye exams. Ask your health care provider how often you should have your eyes checked.  Personal lifestyle choices, including:  Daily care of your teeth and gums.  Regular physical activity.  Eating a healthy diet.  Avoiding tobacco and drug use.  Limiting alcohol use.  Practicing safe sex.  Taking low doses of aspirin every day.  Taking vitamin and mineral supplements as recommended by your health care provider. What happens during an annual well check? The services and screenings done by your health care provider during your annual well check will depend on your age, overall health, lifestyle risk factors, and family history of disease. Counseling  Your health care provider may ask you questions about your:  Alcohol use.  Tobacco use.  Drug use.  Emotional well-being.  Home and  relationship well-being.  Sexual activity.  Eating habits.  History of falls.  Memory and ability to understand (cognition).  Work and work Statistician. Screening  You may have the following tests or measurements:  Height, weight, and BMI.  Blood pressure.  Lipid and cholesterol levels. These may be checked every 5 years, or more frequently if you are over 15 years old.  Skin check.  Lung cancer screening. You may have this screening every year starting at age 32 if you have a 30-pack-year history of smoking and currently smoke or have quit within the past 15 years.  Fecal occult blood test (FOBT) of the stool. You may have this test every year starting at age 63.  Flexible sigmoidoscopy or colonoscopy. You may have a sigmoidoscopy every 5 years or a colonoscopy every 10 years starting at age 26.  Prostate cancer screening. Recommendations will vary depending on your family history and other risks.  Hepatitis C blood test.  Hepatitis B blood test.  Sexually transmitted disease (STD) testing.  Diabetes screening. This is done by checking your blood sugar (glucose) after you have not eaten for a while (fasting). You may have this done every 1-3 years.  Abdominal aortic aneurysm (AAA) screening. You may need this if you are a current or former smoker.  Osteoporosis. You may be screened starting at age 30 if you are at high risk. Talk with your health care provider about your test results, treatment options, and if necessary, the need for more tests. Vaccines  Your health care provider may recommend certain vaccines, such as:  Influenza vaccine. This is recommended every year.  Tetanus, diphtheria, and acellular pertussis (Tdap, Td)  vaccine. You may need a Td booster every 10 years.  Zoster vaccine. You may need this after age 42.  Pneumococcal 13-valent conjugate (PCV13) vaccine. One dose is recommended after age 4.  Pneumococcal polysaccharide (PPSV23) vaccine. One  dose is recommended after age 2. Talk to your health care provider about which screenings and vaccines you need and how often you need them. This information is not intended to replace advice given to you by your health care provider. Make sure you discuss any questions you have with your health care provider. Document Released: 07/21/2015 Document Revised: 03/13/2016 Document Reviewed: 04/25/2015 Elsevier Interactive Patient Education  2017 [place] Prevention in the Home Falls can cause injuries. They can happen to people of all ages. There are many things you can do to make your home safe and to help prevent falls. What can I do on the outside of my home?  Regularly fix the edges of walkways and driveways and fix any cracks.  Remove anything that might make you trip as you walk through a door, such as a raised step or threshold.  Trim any bushes or trees on the path to your home.  Use bright outdoor lighting.  Clear any walking paths of anything that might make someone trip, such as rocks or tools.  Regularly check to see if handrails are loose or broken. Make sure that both sides of any steps have handrails.  Any raised decks and porches should have guardrails on the edges.  Have any leaves, snow, or ice cleared regularly.  Use sand or salt on walking paths during winter.  Clean up any spills in your garage right away. This includes oil or grease spills. What can I do in the bathroom?  Use night lights.  Install grab bars by the toilet and in the tub and shower. Do not use towel bars as grab bars.  Use non-skid mats or decals in the tub or shower.  If you need to sit down in the shower, use a plastic, non-slip stool.  Keep the floor dry. Clean up any water that spills on the floor as soon as it happens.  Remove soap buildup in the tub or shower regularly.  Attach bath mats securely with double-sided non-slip rug tape.  Do not have throw rugs and other  things on the floor that can make you trip. What can I do in the bedroom?  Use night lights.  Make sure that you have a light by your bed that is easy to reach.  Do not use any sheets or blankets that are too big for your bed. They should not hang down onto the floor.  Have a firm chair that has side arms. You can use this for support while you get dressed.  Do not have throw rugs and other things on the floor that can make you trip. What can I do in the kitchen?  Clean up any spills right away.  Avoid walking on wet floors.  Keep items that you use a lot in easy-to-reach places.  If you need to reach something above you, use a strong step stool that has a grab bar.  Keep electrical cords out of the way.  Do not use floor polish or wax that makes floors slippery. If you must use wax, use non-skid floor wax.  Do not have throw rugs and other things on the floor that can make you trip. What can I do with my stairs?  Do not leave  any items on the stairs.  Make sure that there are handrails on both sides of the stairs and use them. Fix handrails that are broken or loose. Make sure that handrails are as long as the stairways.  Check any carpeting to make sure that it is firmly attached to the stairs. Fix any carpet that is loose or worn.  Avoid having throw rugs at the top or bottom of the stairs. If you do have throw rugs, attach them to the floor with carpet tape.  Make sure that you have a light switch at the top of the stairs and the bottom of the stairs. If you do not have them, ask someone to add them for you. What else can I do to help prevent falls?  Wear shoes that:  Do not have high heels.  Have rubber bottoms.  Are comfortable and fit you well.  Are closed at the toe. Do not wear sandals.  If you use a stepladder:  Make sure that it is fully opened. Do not climb a closed stepladder.  Make sure that both sides of the stepladder are locked into place.  Ask  someone to hold it for you, if possible.  Clearly mark and make sure that you can see:  Any grab bars or handrails.  First and last steps.  Where the edge of each step is.  Use tools that help you move around (mobility aids) if they are needed. These include:  Canes.  Walkers.  Scooters.  Crutches.  Turn on the lights when you go into a dark area. Replace any light bulbs as soon as they burn out.  Set up your furniture so you have a clear path. Avoid moving your furniture around.  If any of your floors are uneven, fix them.  If there are any pets around you, be aware of where they are.  Review your medicines with your doctor. Some medicines can make you feel dizzy. This can increase your chance of falling. Ask your doctor what other things that you can do to help prevent falls. This information is not intended to replace advice given to you by your health care provider. Make sure you discuss any questions you have with your health care provider. Document Released: 04/20/2009 Document Revised: 11/30/2015 Document Reviewed: 07/29/2014 Elsevier Interactive Patient Education  2017 [employer].

## 2018-07-06 NOTE — Telephone Encounter (Signed)
Spoke with patient in the lobby, he is requesting a sample of the symbicort. I see where the last phone note with this the patient was to send in the forms for patient assistance. Patient stated that he never did. I have printed the forms and had the patient fill them out while here. I have faxed them in. Patient was given one sample. Nothing further needed.

## 2018-07-16 ENCOUNTER — Encounter: Payer: Self-pay | Admitting: Internal Medicine

## 2018-07-16 ENCOUNTER — Ambulatory Visit (INDEPENDENT_AMBULATORY_CARE_PROVIDER_SITE_OTHER): Payer: PPO | Admitting: Internal Medicine

## 2018-07-16 VITALS — BP 130/68 | HR 52 | Temp 97.9°F | Ht 67.0 in | Wt 202.0 lb

## 2018-07-16 DIAGNOSIS — I10 Essential (primary) hypertension: Secondary | ICD-10-CM

## 2018-07-16 DIAGNOSIS — N183 Chronic kidney disease, stage 3 unspecified: Secondary | ICD-10-CM

## 2018-07-16 DIAGNOSIS — E1122 Type 2 diabetes mellitus with diabetic chronic kidney disease: Secondary | ICD-10-CM

## 2018-07-16 DIAGNOSIS — E7800 Pure hypercholesterolemia, unspecified: Secondary | ICD-10-CM | POA: Diagnosis not present

## 2018-07-16 DIAGNOSIS — N1831 Chronic kidney disease, stage 3a: Secondary | ICD-10-CM

## 2018-07-16 NOTE — Progress Notes (Signed)
Location:  [facility] clinic Provider:  Azarias Chiou L. Mariea Clonts, D.O., C.M.D.  Code Status: DNR Goals of Care:  Advanced Directives 07/06/2018  Does Patient Have a Medical Advance Directive? Yes  Type of Paramedic of [place];Living will  Does patient want to make changes to medical advance directive? No - Patient declined  Copy of [place] in Chart? Yes - validated most recent copy scanned in chart (See row information)  Pre-existing out of facility DNR order (yellow form or pink MOST form) -   Chief Complaint  Patient presents with  . Medical Management of Chronic Issues    6mth follow-up    HPI: Patient is a 83 y.o. male seen today for medical management of chronic diseases.    He feels good.  He went [place].  I drove out there himself.  His son has a tracker in his car and called him when he missed his turn.  Says he was thinking of his wife.  She's been gone about 8 months.  He says he knows she's better.    He's doing good with his lungs.  He takes his mucinex about every other day.  Still going to the gym 3-4 days a week.  BMI 31.    Says he's not looking for a woman.    His sugar had gotten out of whack due to not cooking.  He has been eating out most of the time.  Highest sugar around 160 but mostly around 130.  Does walking as well.    No pain.  Sleeping ok at night.  For about a month after his wife passed, he did not sleep well, but did get better.    Is going to urinate 3 times a night at times.  He says his son is managing his pills thru [facility].  Saw Dr. Posey Pronto for his one year check up and kidneys were stable there.  He's been watching his soft drinks.    He has his shingrix vaccines lined up at the pharmacy.    Past Medical History:  Diagnosis Date  . Allergy   . Alzheimer's disease ([place])   . Anemia, unspecified   . Anxiety   . Chronic maxillary sinusitis   . COPD (chronic obstructive  pulmonary disease) ([place])   . Depression   . Diabetes mellitus   . Edema   . Hypercalcemia   . Hyperlipidemia   . Hypertension   . Hypertrophy of prostate without urinary obstruction and other lower urinary tract symptoms (LUTS)   . Hypertrophy of prostate without urinary obstruction and other lower urinary tract symptoms (LUTS)   . Kidney disease    mild  . Unspecified disorder of kidney and ureter   . Unspecified sleep apnea   . Ventral hernia, unspecified, without mention of obstruction or gangrene     Past Surgical History:  Procedure Laterality Date  . EXPLORATORY LAPAROTOMY  2002  . HERNIA REPAIR      Allergies  Allergen Reactions  . Ace Inhibitors Cough    Outpatient Encounter Medications as of 07/16/2018  Medication Sig  . albuterol (PROVENTIL HFA;VENTOLIN HFA) 108 (90 Base) MCG/ACT inhaler Inhale 2 puffs into the lungs every 6 (six) hours as needed for wheezing or shortness of breath.  Marland Kitchen amLODipine (NORVASC) 10 MG tablet Take 1 tablet (10 mg total) by mouth daily.  Marland Kitchen aspirin EC 81 MG tablet Take 1 tablet (81 mg total) by mouth  daily.  . atorvastatin (LIPITOR) 20 MG tablet Take one tablet by mouth once daily for cholesterol  . budesonide-formoterol (SYMBICORT) 160-4.5 MCG/ACT inhaler Inhale 2 puffs into the lungs 2 (two) times daily.  Marland Kitchen dextromethorphan (DELSYM) 30 MG/5ML liquid Take 2.5 mLs (15 mg total) by mouth 2 (two) times daily as needed for cough.  Marland Kitchen glucose blood (ONE TOUCH ULTRA TEST) test strip USE AS INSTRUCTED TO TEST BLOOD SUGAR ONCE DAILY DX E119  . hydrALAZINE (APRESOLINE) 25 MG tablet Take 1 tablet (25 mg total) by mouth 2 (two) times daily.  Marland Kitchen ipratropium-albuterol (DUONEB) 0.5-2.5 (3) MG/3ML SOLN Take 3 mLs by nebulization every 6 (six) hours as needed (wheezing).  Elmore Guise Devices (ONE TOUCH DELICA LANCING DEV) MISC Use to test blood sugar once daily dx: E119  . Lancets (ONETOUCH ULTRASOFT) lancets Use to test blood sugar up to three times daily. Dx  E11.9  . losartan (COZAAR) 25 MG tablet Take 1 tablet (25 mg total) by mouth daily.  . metoprolol tartrate (LOPRESSOR) 50 MG tablet Take 1 tablet (50 mg total) by mouth 2 (two) times daily.  . montelukast (SINGULAIR) 10 MG tablet Take 1 tablet (10 mg total) by mouth at bedtime.  Marland Kitchen PARoxetine (PAXIL) 20 MG tablet Take 1 tablet by mouth once daily for depression  . polyethylene glycol powder (GLYCOLAX/MIRALAX) powder Take 17 g by mouth every 3 (three) days. For constipation; hold for loose stools  . sitaGLIPtin (JANUVIA) 25 MG tablet Take 1 tablet (25 mg total) by mouth daily.  . traMADol (ULTRAM) 50 MG tablet TAKE 1 TABLET BY MOUTH TWICE A DAY  . traZODone (DESYREL) 50 MG tablet Take 1 tablet (50 mg total) by mouth at bedtime.   No facility-administered encounter medications on file as of 07/16/2018.     Review of Systems:  Review of Systems  Constitutional: Negative for chills, fever and malaise/fatigue.  HENT: Positive for hearing loss. Negative for congestion.   Eyes: Negative for blurred vision.       Glasses  Respiratory: Negative for cough, sputum production, shortness of breath and wheezing.   Cardiovascular: Negative for chest pain, palpitations and leg swelling.  Gastrointestinal: Negative for abdominal pain, blood in stool, constipation, diarrhea, heartburn and melena.  Genitourinary: Negative for dysuria.  Musculoskeletal: Negative for falls and joint pain.  Skin: Negative for itching and rash.  Neurological: Negative for dizziness and loss of consciousness.  Endo/Heme/Allergies: Does not bruise/bleed easily.  Psychiatric/Behavioral: Positive for memory loss. Negative for depression. The patient is not nervous/anxious and does not have insomnia.     Health Maintenance  Topic Date Due  . FOOT EXAM  07/03/2018  . HEMOGLOBIN A1C  08/01/2018  . OPHTHALMOLOGY EXAM  09/19/2018  . TETANUS/TDAP  08/11/2021  . INFLUENZA VACCINE  Completed  . PNA vac Low Risk Adult  Completed     Physical Exam: Vitals:   07/16/18 1035  BP: 130/68  Pulse: (!) 52  Temp: 97.9 F (36.6 C)  TempSrc: Oral  SpO2: 92%  Weight: 202 lb (91.6 kg)  Height: 5\' 7"  (1.702 m)   Body mass index is 31.64 kg/m. Physical Exam Vitals signs reviewed.  Constitutional:      Appearance: Normal appearance.  HENT:     Head: Normocephalic and atraumatic.     Ears:     Comments: Hearing well today with hearing aids Cardiovascular:     Rate and Rhythm: Normal rate and regular rhythm.     Pulses: Normal pulses.  Heart sounds: Normal heart sounds.  Pulmonary:     Effort: Pulmonary effort is normal.     Breath sounds: Normal breath sounds. No wheezing or rhonchi.  Abdominal:     General: Bowel sounds are normal.  Musculoskeletal: Normal range of motion.        General: No swelling or tenderness.  Skin:    General: Skin is warm and dry.  Neurological:     General: No focal deficit present.     Mental Status: He is alert and oriented to person, place, and time.     Comments: But has some short -term memory loss--was much more alert and interactive today--seemed back to himself  Psychiatric:        Mood and Affect: Mood normal.        Behavior: Behavior normal.     Labs reviewed: Basic Metabolic Panel: Recent Labs    04/08/18 0405 04/09/18 0330 04/16/18 0858  NA 136 137 138  K 4.7 4.7 4.8  CL 108 108 102  CO2 23 23 31   GLUCOSE 213* 238* 234*  BUN 46* 62* 44*  CREATININE 2.38* 2.44* 2.12*  CALCIUM 9.3 9.8 9.8   Liver Function Tests: Recent Labs    01/29/18 0809  AST 17  ALT 13  BILITOT 0.4  PROT 6.2   No results for input(s): LIPASE, AMYLASE in the last 8760 hours. No results for input(s): AMMONIA in the last 8760 hours. CBC: Recent Labs    10/09/17 1137 01/29/18 0809  04/09/18 0330 04/16/18 0858 04/21/18 0957  WBC 10.4 8.2   < > 11.5* 15.0* 12.2*  NEUTROABS 6,791 5,223  --   --  11,955*  --   HGB 12.4* 12.8*   < > 11.4* 13.2 13.7  HCT 36.0* 38.3*   < >  34.6* 39.5 40.0  MCV 94.0 95.8   < > 98.0 97.1 95.9  PLT 238 160   < > 156 201 171   < > = values in this interval not displayed.   Lipid Panel: Recent Labs    10/30/17 0859 01/29/18 0809  CHOL 170 166  HDL 48 50  LDLCALC 101* 97  TRIG 118 91  CHOLHDL 3.5 3.3   Lab Results  Component Value Date   HGBA1C 6.2 (H) 01/29/2018    Assessment/Plan 1. Type 2 diabetes mellitus with stage 3 chronic kidney disease, without long-term current use of insulin (HCC) - cont asa, statin, januvia, arb; strive for healthy diet and continue to exercise - Hemoglobin A1c  2. Chronic kidney disease (CKD) stage G3a/A2, moderately decreased glomerular filtration rate (GFR) between 45-59 mL/min/1.73 square meter and albuminuria creatinine ratio between 30-299 mg/g (HCC) -Avoid nephrotoxic agents like nsaids, dose adjust renally excreted meds, hydrate. - CBC with Differential/Platelet - COMPLETE METABOLIC PANEL WITH GFR  3. Essential hypertension -bp at goal with current therapy, cont same regimen and monitor, cont exercise and low sodium diet  4. Pure hypercholesterolemia -cont lipitor - Lipid panel  Labs/tests ordered:   Orders Placed This Encounter  Procedures  . CBC with Differential/Platelet  . COMPLETE METABOLIC PANEL WITH GFR  . Hemoglobin A1c  . Lipid panel    Next appt:  07/17/2018  Dariona Postma L. Colin Norment, D.O. [facility] [address], [place] [address] Cell Phone (Mon-Fri 8am-5pm):  [phone] On Call:  [phone] & follow prompts after 5pm & weekends Office Phone:  [phone] Office Fax:  [phone]

## 2018-07-17 ENCOUNTER — Other Ambulatory Visit: Payer: Self-pay | Admitting: Internal Medicine

## 2018-07-17 LAB — COMPLETE METABOLIC PANEL WITH GFR
AG Ratio: 1.4 (calc) (ref 1.0–2.5)
ALT: 13 U/L (ref 9–46)
AST: 15 U/L (ref 10–35)
Albumin: 3.6 g/dL (ref 3.6–5.1)
Alkaline phosphatase (APISO): 63 U/L (ref 40–115)
BUN/Creatinine Ratio: 20 (calc) (ref 6–22)
BUN: 42 mg/dL — ABNORMAL HIGH (ref 7–25)
CO2: 26 mmol/L (ref 20–32)
Calcium: 9.8 mg/dL (ref 8.6–10.3)
Chloride: 107 mmol/L (ref 98–110)
Creat: 2.1 mg/dL — ABNORMAL HIGH (ref 0.70–1.11)
GFR, Est African American: 33 mL/min/{1.73_m2} — ABNORMAL LOW (ref >=60)
GFR, Est Non African American: 29 mL/min/{1.73_m2} — ABNORMAL LOW (ref >=60)
Globulin: 2.5 g/dL (calc) (ref 1.9–3.7)
Glucose, Bld: 109 mg/dL — ABNORMAL HIGH (ref 65–99)
Potassium: 4.8 mmol/L (ref 3.5–5.3)
Sodium: 141 mmol/L (ref 135–146)
Total Bilirubin: 0.4 mg/dL (ref 0.2–1.2)
Total Protein: 6.1 g/dL (ref 6.1–8.1)

## 2018-07-17 LAB — HEMOGLOBIN A1C
Hgb A1c MFr Bld: 6.9 % of total Hgb — ABNORMAL HIGH (ref <5.7)
Mean Plasma Glucose: 151 (calc)
eAG (mmol/L): 8.4 (calc)

## 2018-07-17 LAB — CBC WITH DIFFERENTIAL/PLATELET
Absolute Monocytes: 820 cells/uL (ref 200–950)
Basophils Absolute: 74 cells/uL (ref 0–200)
Basophils Relative: 0.9 %
Eosinophils Absolute: 180 cells/uL (ref 15–500)
Eosinophils Relative: 2.2 %
HCT: 37.5 % — ABNORMAL LOW (ref 38.5–50.0)
Hemoglobin: 12.9 g/dL — ABNORMAL LOW (ref 13.2–17.1)
Lymphs Abs: 2517 cells/uL (ref 850–3900)
MCH: 33.2 pg — ABNORMAL HIGH (ref 27.0–33.0)
MCHC: 34.4 g/dL (ref 32.0–36.0)
MCV: 96.4 fL (ref 80.0–100.0)
MPV: 9.9 fL (ref 7.5–12.5)
Monocytes Relative: 10 %
Neutro Abs: 4608 cells/uL (ref 1500–7800)
Neutrophils Relative %: 56.2 %
Platelets: 181 10*3/uL (ref 140–400)
RBC: 3.89 10*6/uL — ABNORMAL LOW (ref 4.20–5.80)
RDW: 13.2 % (ref 11.0–15.0)
Total Lymphocyte: 30.7 %
WBC: 8.2 10*3/uL (ref 3.8–10.8)

## 2018-07-17 LAB — LIPID PANEL
Cholesterol: 182 mg/dL (ref <200)
HDL: 46 mg/dL (ref >40)
LDL Cholesterol (Calc): 104 mg/dL (calc) — ABNORMAL HIGH
Non-HDL Cholesterol (Calc): 136 mg/dL (calc) — ABNORMAL HIGH (ref <130)
Total CHOL/HDL Ratio: 4 (calc) (ref <5.0)
Triglycerides: 206 mg/dL — ABNORMAL HIGH (ref <150)

## 2018-07-17 MED ORDER — ATORVASTATIN CALCIUM 40 MG PO TABS: 40.0000 mg | ORAL_TABLET | Freq: Every day | ORAL | 1 refills | Status: DC

## 2018-07-31 ENCOUNTER — Other Ambulatory Visit: Payer: Self-pay | Admitting: *Deleted

## 2018-07-31 MED ORDER — BUDESONIDE-FORMOTEROL FUMARATE 160-4.5 MCG/ACT IN AERO: 2.0000 | INHALATION_SPRAY | Freq: Two times a day (BID) | RESPIRATORY_TRACT | 0 refills | Status: DC

## 2018-07-31 NOTE — Telephone Encounter (Signed)
Patient walked in asking for sample of Symbicort inhaler which we didn't have, pt then asked for refill, which was sent to the pharmacy.

## 2018-08-21 ENCOUNTER — Other Ambulatory Visit: Payer: Self-pay | Admitting: Internal Medicine

## 2018-09-21 ENCOUNTER — Other Ambulatory Visit: Payer: Self-pay | Admitting: *Deleted

## 2018-09-21 MED ORDER — BUDESONIDE-FORMOTEROL FUMARATE 160-4.5 MCG/ACT IN AERO: 2.0000 | INHALATION_SPRAY | Freq: Two times a day (BID) | RESPIRATORY_TRACT | 0 refills | Status: DC

## 2018-09-21 NOTE — Telephone Encounter (Signed)
Patient requested refill

## 2018-09-22 ENCOUNTER — Other Ambulatory Visit: Payer: Self-pay | Admitting: Internal Medicine

## 2018-09-22 DIAGNOSIS — E1122 Type 2 diabetes mellitus with diabetic chronic kidney disease: Secondary | ICD-10-CM

## 2018-10-29 DIAGNOSIS — I129 Hypertensive chronic kidney disease with stage 1 through stage 4 chronic kidney disease, or unspecified chronic kidney disease: Secondary | ICD-10-CM | POA: Diagnosis not present

## 2018-10-29 DIAGNOSIS — N183 Chronic kidney disease, stage 3 (moderate): Secondary | ICD-10-CM | POA: Diagnosis not present

## 2018-10-29 DIAGNOSIS — D631 Anemia in chronic kidney disease: Secondary | ICD-10-CM | POA: Diagnosis not present

## 2018-10-29 DIAGNOSIS — N2581 Secondary hyperparathyroidism of renal origin: Secondary | ICD-10-CM | POA: Diagnosis not present

## 2018-11-25 ENCOUNTER — Encounter: Payer: Self-pay | Admitting: *Deleted

## 2018-11-25 DIAGNOSIS — H31092 Other chorioretinal scars, left eye: Secondary | ICD-10-CM | POA: Diagnosis not present

## 2018-11-25 DIAGNOSIS — H353120 Nonexudative age-related macular degeneration, left eye, stage unspecified: Secondary | ICD-10-CM | POA: Diagnosis not present

## 2018-11-25 DIAGNOSIS — H2513 Age-related nuclear cataract, bilateral: Secondary | ICD-10-CM | POA: Diagnosis not present

## 2018-11-25 DIAGNOSIS — H53022 Refractive amblyopia, left eye: Secondary | ICD-10-CM | POA: Diagnosis not present

## 2018-11-25 DIAGNOSIS — H5034 Intermittent alternating exotropia: Secondary | ICD-10-CM | POA: Diagnosis not present

## 2018-11-25 DIAGNOSIS — E113552 Type 2 diabetes mellitus with stable proliferative diabetic retinopathy, left eye: Secondary | ICD-10-CM | POA: Diagnosis not present

## 2018-11-25 DIAGNOSIS — H353211 Exudative age-related macular degeneration, right eye, with active choroidal neovascularization: Secondary | ICD-10-CM | POA: Diagnosis not present

## 2018-11-25 LAB — HM DIABETES EYE EXAM

## 2018-12-02 ENCOUNTER — Encounter (INDEPENDENT_AMBULATORY_CARE_PROVIDER_SITE_OTHER): Payer: PPO | Admitting: Ophthalmology

## 2018-12-02 ENCOUNTER — Telehealth: Payer: Self-pay | Admitting: Internal Medicine

## 2018-12-02 ENCOUNTER — Other Ambulatory Visit: Payer: Self-pay

## 2018-12-02 DIAGNOSIS — H2513 Age-related nuclear cataract, bilateral: Secondary | ICD-10-CM

## 2018-12-02 DIAGNOSIS — H35033 Hypertensive retinopathy, bilateral: Secondary | ICD-10-CM | POA: Diagnosis not present

## 2018-12-02 DIAGNOSIS — H353211 Exudative age-related macular degeneration, right eye, with active choroidal neovascularization: Secondary | ICD-10-CM

## 2018-12-02 DIAGNOSIS — H348322 Tributary (branch) retinal vein occlusion, left eye, stable: Secondary | ICD-10-CM | POA: Diagnosis not present

## 2018-12-02 DIAGNOSIS — I10 Essential (primary) hypertension: Secondary | ICD-10-CM

## 2018-12-02 MED ORDER — GLUCOSE BLOOD VI STRP: ORAL_STRIP | 3 refills | Status: DC

## 2018-12-02 NOTE — Telephone Encounter (Signed)
Needs 1 touch refill for sugar monitor called into  [facility] on [address]  Thanks,  Lattie Haw

## 2018-12-02 NOTE — Telephone Encounter (Signed)
Rx faxed to pharmacy  

## 2018-12-07 ENCOUNTER — Other Ambulatory Visit: Payer: Self-pay | Admitting: *Deleted

## 2018-12-07 MED ORDER — GLUCOSE BLOOD VI STRP: ORAL_STRIP | 3 refills | Status: DC

## 2018-12-07 NOTE — Telephone Encounter (Signed)
Patient uses test strips twice daily.

## 2018-12-15 ENCOUNTER — Telehealth: Payer: Self-pay | Admitting: *Deleted

## 2018-12-15 NOTE — Telephone Encounter (Signed)
Maybe a phone visit could be done to address his insomnia and hypertension with his son present?

## 2018-12-15 NOTE — Telephone Encounter (Signed)
Patient son, [name] called and stated that patient is not sleeping well at night. Wants to know if his Trazodone can be increased. Also stated that his BP is running alittle high at 150/70. No other symptoms. No diet changes.  Thinks it is running high due to not sleeping well. Please Advise.

## 2018-12-16 NOTE — Telephone Encounter (Signed)
Son scheduled TeleVisit with NP for Friday.

## 2018-12-18 ENCOUNTER — Other Ambulatory Visit: Payer: Self-pay

## 2018-12-18 ENCOUNTER — Encounter: Payer: Self-pay | Admitting: Family

## 2018-12-18 ENCOUNTER — Other Ambulatory Visit: Payer: Self-pay | Admitting: *Deleted

## 2018-12-18 ENCOUNTER — Other Ambulatory Visit: Payer: Self-pay | Admitting: Internal Medicine

## 2018-12-18 ENCOUNTER — Ambulatory Visit (INDEPENDENT_AMBULATORY_CARE_PROVIDER_SITE_OTHER): Payer: PPO | Admitting: Family

## 2018-12-18 DIAGNOSIS — F418 Other specified anxiety disorders: Secondary | ICD-10-CM | POA: Diagnosis not present

## 2018-12-18 DIAGNOSIS — E1159 Type 2 diabetes mellitus with other circulatory complications: Secondary | ICD-10-CM | POA: Diagnosis not present

## 2018-12-18 DIAGNOSIS — N1831 Chronic kidney disease, stage 3a: Secondary | ICD-10-CM

## 2018-12-18 DIAGNOSIS — R0981 Nasal congestion: Secondary | ICD-10-CM | POA: Diagnosis not present

## 2018-12-18 DIAGNOSIS — G4700 Insomnia, unspecified: Secondary | ICD-10-CM

## 2018-12-18 DIAGNOSIS — I10 Essential (primary) hypertension: Secondary | ICD-10-CM | POA: Diagnosis not present

## 2018-12-18 MED ORDER — HYDRALAZINE HCL 25 MG PO TABS: 25.0000 mg | ORAL_TABLET | Freq: Two times a day (BID) | ORAL | 1 refills | Status: DC

## 2018-12-18 MED ORDER — FLUTICASONE PROPIONATE 50 MCG/ACT NA SUSP: 2.0000 | Freq: Every day | NASAL | 6 refills | Status: DC

## 2018-12-18 MED ORDER — ATORVASTATIN CALCIUM 40 MG PO TABS: 40.0000 mg | ORAL_TABLET | Freq: Every day | ORAL | 1 refills | Status: DC

## 2018-12-18 MED ORDER — MELATONIN 3 MG PO TABS: 3.0000 mg | ORAL_TABLET | Freq: Every day | ORAL | 3 refills | Status: AC

## 2018-12-18 MED ORDER — TRAZODONE HCL 50 MG PO TABS: 50.0000 mg | ORAL_TABLET | Freq: Every day | ORAL | 1 refills | Status: DC

## 2018-12-18 MED ORDER — PAROXETINE HCL 30 MG PO TABS: ORAL_TABLET | ORAL | 3 refills | Status: DC

## 2018-12-18 NOTE — Patient Instructions (Addendum)
1.Increase Paxil from 20 mg tablet to 30 mg tablet daily.Notify provider if symptoms worsen. 2. check blood pressure daily then notify provider's office if SBP persistent in > 150.  3. Flonase 2 sprays into each nare twice daily for nasal congestion.

## 2018-12-18 NOTE — Progress Notes (Addendum)
This service is provided via telemedicine  No vital signs collected/recorded due to the encounter was a telemedicine visit.   Location of patient (ex: home, work):  Home   Patient consents to a telephone visit:  Yes  Location of the provider (ex: office, home):  Office   Name of any referring provider:  Dr. Hollace Kinnier   Names of all persons participating in the telemedicine service and their role in the encounter:  Ruthell Rummage CMA, Dinah Ngetich NP, Harlen Labs  Time spent on call:  Ruthell Rummage CMA, spent 12  Minutes on the phone with patient.   Provider: Dinah Ngetich FNP-C  Gayland Curry, DO  Patient Care Team: Gayland Curry, DO as PCP - General (Geriatric Medicine) Zebedee Iba., MD as Referring Physician (Ophthalmology) Chesley Mires, MD as Consulting Physician (Pulmonary Disease)  Extended Emergency Contact Information Primary Emergency Contact: Shockley,Mike Mobile Phone: [phone] Relation: Son Secondary Emergency Contact: Recinos,Matt  [place] Mobile Phone: [phone] Relation: Son   Goals of care: Advanced Directive information Advanced Directives 07/06/2018  Does Patient Have a Medical Advance Directive? Yes  Type of Paramedic of [place];Living will  Does patient want to make changes to medical advance directive? No - Patient declined  Copy of [place] in Chart? Yes - validated most recent copy scanned in chart (See row information)  Pre-existing out of facility DNR order (yellow form or pink MOST form) -     Chief Complaint  Patient presents with  . Acute Visit    Hypertension and Insomnia patient states duration of a month   . Medical Management of Chronic Issues    patient sons states that pharmacy has not been sending amlodipine     HPI:  Pt is a 83 y.o. male seen today for an acute visit for evaluation of blood pressure and insomnia.Patient's son present during visit and  provided most HPI.He states patient has had an increased depression and anxiety this month.He state June is the anniversary month since wife passed away one year ago.He states feels lonely living by himself.He does have friends but he is [place] of hearing so cannot talk with them on the phone.He states not sleeping well at night despite taking Trazodone. His blood pressure has been slightly high SBP in the 150's.son states pharmacy has not been adding his amlodipine to medication and some medication have been switched to morning instead of at bedtime.He states will get in contact with pharmacy today to check why medication was changed.I've reviewed patient's chart no recent medication adjusted by MD.  Patient also complains of worsening nasal congestion.He denies any fever,chills,cough or symptoms of Urinary tract infections.     Past Medical History:  Diagnosis Date  . Allergy   . Alzheimer's disease ([place])   . Anemia, unspecified   . Anxiety   . Chronic maxillary sinusitis   . COPD (chronic obstructive pulmonary disease) ([place])   . Depression   . Diabetes mellitus   . Edema   . Hypercalcemia   . Hyperlipidemia   . Hypertension   . Hypertrophy of prostate without urinary obstruction and other lower urinary tract symptoms (LUTS)   . Hypertrophy of prostate without urinary obstruction and other lower urinary tract symptoms (LUTS)   . Kidney disease    mild  . Unspecified disorder of kidney and ureter   . Unspecified sleep apnea   . Ventral hernia, unspecified, without mention of obstruction or gangrene  Past Surgical History:  Procedure Laterality Date  . EXPLORATORY LAPAROTOMY  2002  . HERNIA REPAIR      Allergies  Allergen Reactions  . Ace Inhibitors Cough    Outpatient Encounter Medications as of 12/18/2018  Medication Sig  . albuterol (PROVENTIL HFA;VENTOLIN HFA) 108 (90 Base) MCG/ACT inhaler Inhale 2 puffs into the lungs every 6 (six) hours as needed for wheezing or  shortness of breath.  Marland Kitchen amLODipine (NORVASC) 10 MG tablet TAKE 1 TABLET BY MOUTH EVERY DAY  . aspirin (ASPIRIN LOW DOSE) 81 MG EC tablet Take 1 tablet (81 mg total) by mouth daily. DX: I50.32  . atorvastatin (LIPITOR) 40 MG tablet Take 1 tablet (40 mg total) by mouth daily.  . budesonide-formoterol (SYMBICORT) 160-4.5 MCG/ACT inhaler Inhale 2 puffs into the lungs 2 (two) times daily.  Marland Kitchen glucose blood (ONE TOUCH ULTRA TEST) test strip Use to test blood sugar twice daily.  DX: E11.9  . hydrALAZINE (APRESOLINE) 25 MG tablet Take 1 tablet (25 mg total) by mouth 2 (two) times daily.  Marland Kitchen ipratropium-albuterol (DUONEB) 0.5-2.5 (3) MG/3ML SOLN Take 3 mLs by nebulization every 6 (six) hours as needed (wheezing).  Marland Kitchen JANUVIA 25 MG tablet TAKE 1 TABLET (25 MG TOTAL) BY MOUTH DAILY.  Marland Kitchen Lancet Devices (ONE TOUCH DELICA LANCING DEV) MISC Use to test blood sugar once daily dx: E119  . Lancets (ONETOUCH ULTRASOFT) lancets Use to test blood sugar up to three times daily. Dx E11.9  . losartan (COZAAR) 25 MG tablet TAKE 1 TABLET BY MOUTH EVERY DAY  . metoprolol tartrate (LOPRESSOR) 50 MG tablet TAKE 1 TABLET BY MOUTH TWICE A DAY  . montelukast (SINGULAIR) 10 MG tablet TAKE 1 TABLET BY MOUTH AT BEDTIME  . PARoxetine (PAXIL) 20 MG tablet TAKE 1 TABLET BY MOUTH ONCE DAILY FOR DEPRESSION  . polyethylene glycol powder (GLYCOLAX/MIRALAX) powder Take 17 g by mouth every 3 (three) days. For constipation; hold for loose stools  . traZODone (DESYREL) 50 MG tablet Take 1 tablet (50 mg total) by mouth at bedtime.  . [DISCONTINUED] dextromethorphan (DELSYM) 30 MG/5ML liquid Take 2.5 mLs (15 mg total) by mouth 2 (two) times daily as needed for cough.  . [DISCONTINUED] traMADol (ULTRAM) 50 MG tablet TAKE 1 TABLET BY MOUTH TWICE A DAY   No facility-administered encounter medications on file as of 12/18/2018.     Review of Systems  Constitutional: Negative for appetite change, chills, fatigue and fever.  HENT: Positive for  congestion and hearing loss. Negative for rhinorrhea, sinus pressure, sinus pain, sneezing and sore throat.   Eyes: Positive for visual disturbance. Negative for discharge, redness and itching.       Follows up with Ophthalmology for cataract   Respiratory: Negative for cough, chest tightness and shortness of breath.        Uses inhaler sometimes for wheezing    Cardiovascular: Positive for leg swelling. Negative for chest pain and palpitations.  Gastrointestinal: Negative for abdominal distention, abdominal pain, constipation, diarrhea, nausea and vomiting.  Genitourinary: Negative for difficulty urinating, dysuria, flank pain, frequency and urgency.  Musculoskeletal: Negative for arthralgias and back pain.  Skin: Negative for color change, pallor and rash.  Neurological: Negative for dizziness, light-headedness and headaches.  Psychiatric/Behavioral: Positive for sleep disturbance. Negative for agitation. The patient is nervous/anxious.     Immunization History  Administered Date(s) Administered  . Influenza Split 04/08/2011  . Influenza Whole 04/07/2012  . Influenza, High Dose Seasonal PF 04/07/2017, 05/25/2018  . Influenza,inj,Quad PF,6+ Mos 04/23/2013,  03/28/2014, 02/23/2016  . Influenza-Unspecified 04/08/2015, 07/09/2015, 04/08/2017  . Pneumococcal Conjugate-13 06/06/2014  . Pneumococcal-Unspecified 07/09/2007  . Td 08/09/1997  . Tdap 08/12/2011   Pertinent  Health Maintenance Due  Topic Date Due  . FOOT EXAM  07/03/2018  . HEMOGLOBIN A1C  01/14/2019  . INFLUENZA VACCINE  02/06/2019  . OPHTHALMOLOGY EXAM  11/25/2019  . PNA vac Low Risk Adult  Completed   Fall Risk  12/18/2018 07/16/2018 07/06/2018 04/16/2018 04/13/2018  Falls in the past year? 0 0 0 No No  Number falls in past yr: 0 0 - - -  Injury with Fall? 0 0 - - -   There were no vitals filed for this visit. There is no height or weight on file to calculate BMI. Physical Exam  unable to complete on telephone visit.    Labs reviewed: Recent Labs    04/09/18 0330 04/16/18 0858 07/16/18 1129  NA 137 138 141  K 4.7 4.8 4.8  CL 108 102 107  CO2 23 31 26   GLUCOSE 238* 234* 109*  BUN 62* 44* 42*  CREATININE 2.44* 2.12* 2.10*  CALCIUM 9.8 9.8 9.8   Recent Labs    01/29/18 0809 07/16/18 1129  AST 17 15  ALT 13 13  BILITOT 0.4 0.4  PROT 6.2 6.1   Recent Labs    01/29/18 0809  04/16/18 0858 04/21/18 0957 07/16/18 1129  WBC 8.2   < > 15.0* 12.2* 8.2  NEUTROABS 5,223  --  11,955*  --  4,608  HGB 12.8*   < > 13.2 13.7 12.9*  HCT 38.3*   < > 39.5 40.0 37.5*  MCV 95.8   < > 97.1 95.9 96.4  PLT 160   < > 201 171 181   < > = values in this interval not displayed.   No results found for: TSH Lab Results  Component Value Date   HGBA1C 6.9 (H) 07/16/2018   Lab Results  Component Value Date   CHOL 182 07/16/2018   HDL 46 07/16/2018   LDLCALC 104 (H) 07/16/2018   TRIG 206 (H) 07/16/2018   CHOLHDL 4.0 07/16/2018    Significant Diagnostic Results in last 30 days:  No results found.  Assessment/Plan 1. Nasal congestion Worsening symptoms.Has used Claritin without relief. Add Flonase 2 sprays into each nare twice daily.    2. Hypertension associated with diabetes ([place]) SBP running in the 150's patient's son will check with pharmacy why amlodipine was not included in the medication box.His anxiety could be contributing to high blood pressure.continue on current medication.will check blood pressure daily then notify provider's office if SBP persistent in > 150.   3. Depression with anxiety Wife's death anniversary this month.Has been more depressed and anxious.Increase Paxil from 20 mg tablet to 30 mg tablet daily.Notify provider if symptoms worsen.   4. Insomnia, unspecified type Continue on Trazodone 50 mg tablet at bedtime.Add melatonin 3 mg tablet daily at bedtime may repeat x 1 dose if still unable to sleep.   Family/ staff Communication: Reviewed plan of care with patient and son.   Labs/tests ordered: None  Time spent with patient 16  minutes >50% time spent counseling; reviewing medical record; tests; labs; and developing future plan of care  Sandrea Hughs, NP

## 2018-12-23 ENCOUNTER — Other Ambulatory Visit: Payer: Self-pay

## 2018-12-23 ENCOUNTER — Encounter: Payer: Self-pay | Admitting: Nurse Practitioner

## 2018-12-23 ENCOUNTER — Ambulatory Visit: Payer: PPO | Admitting: Pulmonary Disease

## 2018-12-23 ENCOUNTER — Ambulatory Visit (INDEPENDENT_AMBULATORY_CARE_PROVIDER_SITE_OTHER): Payer: PPO | Admitting: Nurse Practitioner

## 2018-12-23 DIAGNOSIS — J449 Chronic obstructive pulmonary disease, unspecified: Secondary | ICD-10-CM

## 2018-12-23 NOTE — Patient Instructions (Signed)
Continue Symbicort 160 >>> 2 puffs in the morning right when you wake up, rinse out your mouth after use, 12 hours later 2 puffs, rinse after use >>> Take this daily, no matter what >>> This is not a rescue inhaler   Only use your albuterol as a rescue medication to be used if you can't catch your breath by resting or doing a relaxed purse lip breathing pattern.  - The less you use it, the better it will work when you need it. - Ok to use up to 2 puffs every 4 hours if you must but call for immediate appointment if use goes up over your usual need - Don't leave home without it !! (think of it like the spare tire for your car)   Can use Duoneb nebulized medication every 6 hours as need for shortness of breath or wheezing    Follow up: Follow up with Dr. Halford Chessman or [facility] in 6 months or sooner if needed

## 2018-12-23 NOTE — Assessment & Plan Note (Signed)
Patient presents today for follow-up visit.  This is been a stable interval for patient.  He has no new issues or concerns.  We will keep him on the same regimen.  Patient Instructions  Continue Symbicort 160 >>> 2 puffs in the morning right when you wake up, rinse out your mouth after use, 12 hours later 2 puffs, rinse after use >>> Take this daily, no matter what >>> This is not a rescue inhaler   Only use your albuterol as a rescue medication to be used if you can't catch your breath by resting or doing a relaxed purse lip breathing pattern.  - The less you use it, the better it will work when you need it. - Ok to use up to 2 puffs every 4 hours if you must but call for immediate appointment if use goes up over your usual need - Don't leave home without it !! (think of it like the spare tire for your car)   Can use Duoneb nebulized medication every 6 hours as need for shortness of breath or wheezing    Follow up: Follow up with Dr. Halford Chessman or [facility] in 6 months or sooner if needed

## 2018-12-23 NOTE — Progress Notes (Signed)
@Patient  ID: Ian [name], male    DOB: 1935/10/14, 83 y.o.   MRN: [id number]  Chief Complaint  Patient presents with  . Follow-up    COPD - patient stated he has not needed to use nebulizer machine in a while. Uses Symbicort as directed.    Referring provider: Gayland Curry, DO  HPI  83 year old male former smoker (quit 1990 - 45-pack-year smoking history) with COPD with asthmatic component who is followed by Dr. Halford Chessman. Maintenance: Symbicort 160  Tests:  Chest imaging: CT chest 12/29/12 >> lingular PNA CT chest 12/06/15 >> mild centrilobular emphysema, nodularity in Lt upper and lower lobes CT chest 02/05/16 >> mild paraseptal emphysema, scar in lingula, much improved nodules in LUL CXR 10/20/17>> no acute abnormality noted.   Echo 01/03/13 >> EF 60 to 65%, grade 1 DD  PFT Results Latest Ref Rng & Units 01/01/2018  FVC-Pre L 2.13  FVC-Predicted Pre % 60  Pre FEV1/FVC % % 63  FEV1-Pre L 1.34  FEV1-Predicted Pre % 54  DLCO UNC% % 60  DLCO COR %Predicted % 92  PFT 11/29/11>>FEV1 1.71 (67%), FEV1% 64, TLC 5.29 (92%), DLCO 84%, +BD   OV 12/23/18 - Follow up  Patient presents today for 14-month follow-up.  Patient states that this is been a stable interval for him.  He denies any new issues or concerns today.  He has not had any exacerbations since his last visit here on 06/22/2018 with Wyn Quaker.  He states that he has not had to use his rescue inhaler in several months.  He is compliant with Symbicort.  Denies any significant shortness of breath or cough.  Patient is active and does not have any problems completing activities of daily living.  Denies f/c/s, n/v/d, hemoptysis, PND, leg swelling.       Allergies  Allergen Reactions  . Ace Inhibitors Cough    Immunization History  Administered Date(s) Administered  . Influenza Split 04/08/2011  . Influenza Whole 04/07/2012  . Influenza, High Dose Seasonal PF 04/07/2017, 05/25/2018  . Influenza,inj,Quad PF,6+ Mos  04/23/2013, 03/28/2014, 02/23/2016  . Influenza-Unspecified 04/08/2015, 07/09/2015, 04/08/2017  . Pneumococcal Conjugate-13 06/06/2014  . Pneumococcal-Unspecified 07/09/2007  . Td 08/09/1997  . Tdap 08/12/2011    Past Medical History:  Diagnosis Date  . Allergy   . Alzheimer's disease ([place])   . Anemia, unspecified   . Anxiety   . Chronic maxillary sinusitis   . COPD (chronic obstructive pulmonary disease) ([place])   . Depression   . Diabetes mellitus   . Edema   . Hypercalcemia   . Hyperlipidemia   . Hypertension   . Hypertrophy of prostate without urinary obstruction and other lower urinary tract symptoms (LUTS)   . Hypertrophy of prostate without urinary obstruction and other lower urinary tract symptoms (LUTS)   . Kidney disease    mild  . Unspecified disorder of kidney and ureter   . Unspecified sleep apnea   . Ventral hernia, unspecified, without mention of obstruction or gangrene     Tobacco History: Social History   Tobacco Use  Smoking Status Former Smoker  . Packs/day: 1.50  . Years: 30.00  . Pack years: 45.00  . Types: Cigarettes  . Quit date: 07/08/1988  . Years since quitting: 30.4  Smokeless Tobacco Never Used   Counseling given: Yes   Outpatient Encounter Medications as of 12/23/2018  Medication Sig  . albuterol (PROVENTIL HFA;VENTOLIN HFA) 108 (90 Base) MCG/ACT inhaler Inhale 2 puffs into the lungs every 6 (  six) hours as needed for wheezing or shortness of breath.  Marland Kitchen amLODipine (NORVASC) 10 MG tablet TAKE 1 TABLET BY MOUTH EVERY DAY  . aspirin (ASPIRIN LOW DOSE) 81 MG EC tablet Take 1 tablet (81 mg total) by mouth daily. DX: I50.32  . atorvastatin (LIPITOR) 40 MG tablet Take 1 tablet (40 mg total) by mouth daily.  . budesonide-formoterol (SYMBICORT) 160-4.5 MCG/ACT inhaler Inhale 2 puffs into the lungs 2 (two) times daily.  . fluticasone (FLONASE) 50 MCG/ACT nasal spray Place 2 sprays into both nostrils daily.  Marland Kitchen glucose blood (ONE TOUCH ULTRA TEST)  test strip Use to test blood sugar twice daily.  DX: E11.9  . hydrALAZINE (APRESOLINE) 25 MG tablet Take 1 tablet (25 mg total) by mouth 2 (two) times daily.  Marland Kitchen ipratropium-albuterol (DUONEB) 0.5-2.5 (3) MG/3ML SOLN Take 3 mLs by nebulization every 6 (six) hours as needed (wheezing).  Marland Kitchen JANUVIA 25 MG tablet TAKE 1 TABLET (25 MG TOTAL) BY MOUTH DAILY.  Marland Kitchen Lancet Devices (ONE TOUCH DELICA LANCING DEV) MISC Use to test blood sugar once daily dx: E119  . Lancets (ONETOUCH ULTRASOFT) lancets Use to test blood sugar up to three times daily. Dx E11.9  . losartan (COZAAR) 25 MG tablet TAKE 1 TABLET BY MOUTH EVERY DAY  . Melatonin 3 MG TABS Take 1 tablet (3 mg total) by mouth at bedtime for 30 days. May repeat x 1 dose  . metoprolol tartrate (LOPRESSOR) 50 MG tablet TAKE 1 TABLET BY MOUTH TWICE A DAY  . montelukast (SINGULAIR) 10 MG tablet TAKE 1 TABLET BY MOUTH AT BEDTIME  . PARoxetine (PAXIL) 30 MG tablet Take one by mouth daily  . polyethylene glycol powder (GLYCOLAX/MIRALAX) powder Take 17 g by mouth every 3 (three) days. For constipation; hold for loose stools  . traZODone (DESYREL) 50 MG tablet Take 1 tablet (50 mg total) by mouth at bedtime.   No facility-administered encounter medications on file as of 12/23/2018.      Review of Systems  Review of Systems  Constitutional: Negative.  Negative for chills and fever.  HENT: Negative.   Respiratory: Negative for cough, shortness of breath and wheezing.   Cardiovascular: Negative.  Negative for chest pain, palpitations and leg swelling.  Gastrointestinal: Negative.   Allergic/Immunologic: Negative.   Neurological: Negative.   Psychiatric/Behavioral: Negative.        Physical Exam  BP 132/68 (BP Location: Left Arm, Patient Position: Sitting, Cuff Size: Normal)   Pulse 60   Temp 97.9 F (36.6 C)   Ht 5\' 7"  (1.702 m)   Wt 206 lb 7.2 oz (93.6 kg)   SpO2 95%   BMI 32.33 kg/m   Wt Readings from Last 5 Encounters:  12/23/18 206 lb 7.2  oz (93.6 kg)  07/16/18 202 lb (91.6 kg)  07/06/18 200 lb (90.7 kg)  06/22/18 203 lb (92.1 kg)  04/16/18 198 lb (89.8 kg)     Physical Exam Vitals signs and nursing note reviewed.  Constitutional:      General: He is not in acute distress.    Appearance: He is well-developed.  Cardiovascular:     Rate and Rhythm: Normal rate and regular rhythm.  Pulmonary:     Effort: Pulmonary effort is normal. No respiratory distress.     Breath sounds: Normal breath sounds. No wheezing or rhonchi.  Musculoskeletal:        General: No swelling.  Skin:    General: Skin is warm and dry.  Neurological:  Mental Status: He is alert and oriented to person, place, and time.       Assessment & Plan:   COPD with asthma [facility]) Patient presents today for follow-up visit.  This is been a stable interval for patient.  He has no new issues or concerns.  We will keep him on the same regimen.  Patient Instructions  Continue Symbicort 160 >>> 2 puffs in the morning right when you wake up, rinse out your mouth after use, 12 hours later 2 puffs, rinse after use >>> Take this daily, no matter what >>> This is not a rescue inhaler   Only use your albuterol as a rescue medication to be used if you can't catch your breath by resting or doing a relaxed purse lip breathing pattern.  - The less you use it, the better it will work when you need it. - Ok to use up to 2 puffs every 4 hours if you must but call for immediate appointment if use goes up over your usual need - Don't leave home without it !! (think of it like the spare tire for your car)   Can use Duoneb nebulized medication every 6 hours as need for shortness of breath or wheezing    Follow up: Follow up with Dr. Halford Chessman or Lazaro Arms in 6 months or sooner if needed       Fenton Foy, NP 12/23/2018

## 2018-12-25 ENCOUNTER — Other Ambulatory Visit: Payer: Self-pay | Admitting: *Deleted

## 2018-12-25 ENCOUNTER — Other Ambulatory Visit: Payer: Self-pay | Admitting: Internal Medicine

## 2018-12-25 MED ORDER — SITAGLIPTIN PHOSPHATE 25 MG PO TABS: ORAL_TABLET | ORAL | 1 refills | Status: DC

## 2018-12-25 NOTE — Telephone Encounter (Signed)
[facility] 

## 2018-12-28 ENCOUNTER — Telehealth: Payer: Self-pay | Admitting: *Deleted

## 2018-12-28 ENCOUNTER — Other Ambulatory Visit: Payer: Self-pay

## 2018-12-28 ENCOUNTER — Ambulatory Visit: Payer: PPO | Admitting: Family

## 2018-12-28 ENCOUNTER — Encounter (INDEPENDENT_AMBULATORY_CARE_PROVIDER_SITE_OTHER): Payer: PPO | Admitting: Ophthalmology

## 2018-12-28 DIAGNOSIS — H353211 Exudative age-related macular degeneration, right eye, with active choroidal neovascularization: Secondary | ICD-10-CM

## 2018-12-28 DIAGNOSIS — H43813 Vitreous degeneration, bilateral: Secondary | ICD-10-CM

## 2018-12-28 DIAGNOSIS — H348322 Tributary (branch) retinal vein occlusion, left eye, stable: Secondary | ICD-10-CM | POA: Diagnosis not present

## 2018-12-28 DIAGNOSIS — H35033 Hypertensive retinopathy, bilateral: Secondary | ICD-10-CM | POA: Diagnosis not present

## 2018-12-28 DIAGNOSIS — I10 Essential (primary) hypertension: Secondary | ICD-10-CM

## 2018-12-28 NOTE — Telephone Encounter (Signed)
Will do. Thanks.

## 2018-12-28 NOTE — Telephone Encounter (Signed)
Recommend scheduling visit at the office to follow up high blood pressure.Please bring blood pressure log to office visit.

## 2018-12-28 NOTE — Telephone Encounter (Signed)
Agree with plan.  We should check orthostatics when he's here, too, b/c he is prone to dizziness with more medication.

## 2018-12-28 NOTE — Telephone Encounter (Signed)
Son stated that patient Has started taking the Amlodipine Thursday that had been left out of his [place]. Son thinks BP is still too high. Please Advise.  Thurs- 185/75 Fri- 178/68 Sun- 160/70 Sun Night 146/70

## 2018-12-28 NOTE — Telephone Encounter (Signed)
Patient son,Mike called and left message on clinical intake and stated that patient's blood pressure is remaining >150/70 and was told to call if top number stayed elevated. Stated that the [employer] had messed up patient's medications.    Tried calling son back, LMOM to return call.

## 2018-12-28 NOTE — Telephone Encounter (Signed)
Patient son notified and scheduled appointment.

## 2019-01-02 ENCOUNTER — Other Ambulatory Visit: Payer: Self-pay | Admitting: Internal Medicine

## 2019-01-25 ENCOUNTER — Other Ambulatory Visit: Payer: Self-pay

## 2019-01-25 ENCOUNTER — Encounter: Payer: Self-pay | Admitting: Internal Medicine

## 2019-01-25 ENCOUNTER — Ambulatory Visit (INDEPENDENT_AMBULATORY_CARE_PROVIDER_SITE_OTHER): Payer: PPO | Admitting: Internal Medicine

## 2019-01-25 VITALS — BP 160/70 | HR 63 | Temp 98.2°F | Ht 67.0 in | Wt 208.0 lb

## 2019-01-25 DIAGNOSIS — N183 Chronic kidney disease, stage 3 unspecified: Secondary | ICD-10-CM

## 2019-01-25 DIAGNOSIS — E1159 Type 2 diabetes mellitus with other circulatory complications: Secondary | ICD-10-CM | POA: Diagnosis not present

## 2019-01-25 DIAGNOSIS — I10 Essential (primary) hypertension: Secondary | ICD-10-CM

## 2019-01-25 DIAGNOSIS — N1831 Chronic kidney disease, stage 3a: Secondary | ICD-10-CM

## 2019-01-25 DIAGNOSIS — E7800 Pure hypercholesterolemia, unspecified: Secondary | ICD-10-CM

## 2019-01-25 DIAGNOSIS — J449 Chronic obstructive pulmonary disease, unspecified: Secondary | ICD-10-CM | POA: Diagnosis not present

## 2019-01-25 DIAGNOSIS — E1122 Type 2 diabetes mellitus with diabetic chronic kidney disease: Secondary | ICD-10-CM | POA: Diagnosis not present

## 2019-01-25 DIAGNOSIS — I152 Hypertension secondary to endocrine disorders: Secondary | ICD-10-CM

## 2019-01-25 DIAGNOSIS — M7918 Myalgia, other site: Secondary | ICD-10-CM | POA: Diagnosis not present

## 2019-01-25 DIAGNOSIS — I5032 Chronic diastolic (congestive) heart failure: Secondary | ICD-10-CM | POA: Diagnosis not present

## 2019-01-25 DIAGNOSIS — J4489 Other specified chronic obstructive pulmonary disease: Secondary | ICD-10-CM

## 2019-01-25 MED ORDER — DICLOFENAC SODIUM 1 % TD GEL: 2.0000 g | Freq: Four times a day (QID) | TRANSDERMAL | 3 refills | Status: DC | PRN

## 2019-01-25 NOTE — Progress Notes (Signed)
Location:  [place] [facility]  Advanced Directives 07/06/2018  Does Patient Have a Medical Advance Directive? Yes  Type of Paramedic of [place];Living will  Does patient want to make changes to medical advance directive? No - Patient declined  Copy of [place] in Chart? Yes - validated most recent copy scanned in chart (See row information)  Pre-existing out of facility DNR order (yellow form or pink MOST form) -     Chief Complaint  Patient presents with  . Acute Visit    right shoulder pain    HPI: Patient is a 83 y.o. male seen today for medical management of chronic diseases.    He presents today with a main complaint of shoulder and neck pain. The right shoulder hurts more than the left. He states he has taken some ibuprofen within the past week to help with the pain. The pain occurs more in the AM when he first wakes up. His pillows might also be the cause of some of this pain.   Patient states he is short of breath laying flat at times. He is currently using one pillow to sleep on. He states he plans on seeing his pulmonary doctor for this issue.   He has been checking his blood glucose twice a day, once in the AM and once in the early evening. His AM blood glucose averages around 150-170's. His evening blood glucose averages around 150. He follows a diet low in sugar but admits to eating 2-3 servings of bread a day. He also admits to drinking orange juice every day and diet sodas daily.   No recent falls or injuries reported.   Patient just recently visited [facility] within past few weeks.   His wife passed away 14 months ago and he claims he is doing well. He visits her grave site twice a week and "talks to her often."     Past Medical History:  Diagnosis Date  . Allergy   . Alzheimer's disease ([place])   . Anemia, unspecified   . Anxiety   . Chronic maxillary sinusitis   . COPD (chronic obstructive pulmonary disease) ([place])    . Depression   . Diabetes mellitus   . Edema   . Hypercalcemia   . Hyperlipidemia   . Hypertension   . Hypertrophy of prostate without urinary obstruction and other lower urinary tract symptoms (LUTS)   . Hypertrophy of prostate without urinary obstruction and other lower urinary tract symptoms (LUTS)   . Kidney disease    mild  . Unspecified disorder of kidney and ureter   . Unspecified sleep apnea   . Ventral hernia, unspecified, without mention of obstruction or gangrene     Past Surgical History:  Procedure Laterality Date  . EXPLORATORY LAPAROTOMY  2002  . HERNIA REPAIR      Allergies  Allergen Reactions  . Ace Inhibitors Cough    Outpatient Encounter Medications as of 01/25/2019  Medication Sig  . albuterol (PROVENTIL HFA;VENTOLIN HFA) 108 (90 Base) MCG/ACT inhaler Inhale 2 puffs into the lungs every 6 (six) hours as needed for wheezing or shortness of breath.  Marland Kitchen amLODipine (NORVASC) 10 MG tablet TAKE 1 TABLET BY MOUTH EVERY DAY  . aspirin (ASPIRIN LOW DOSE) 81 MG EC tablet Take 1 tablet (81 mg total) by mouth daily. DX: I50.32  . atorvastatin (LIPITOR) 40 MG tablet TAKE 1 TABLET BY MOUTH EVERY DAY  . budesonide-formoterol (SYMBICORT) 160-4.5 MCG/ACT inhaler Inhale 2 puffs into  the lungs 2 (two) times daily.  . fluticasone (FLONASE) 50 MCG/ACT nasal spray Place 2 sprays into both nostrils daily.  Marland Kitchen glucose blood (ONE TOUCH ULTRA TEST) test strip Use to test blood sugar twice daily.  DX: E11.9  . hydrALAZINE (APRESOLINE) 25 MG tablet Take 1 tablet (25 mg total) by mouth 2 (two) times daily.  Marland Kitchen ipratropium-albuterol (DUONEB) 0.5-2.5 (3) MG/3ML SOLN Take 3 mLs by nebulization every 6 (six) hours as needed (wheezing).  Elmore Guise Devices (ONE TOUCH DELICA LANCING DEV) MISC Use to test blood sugar once daily dx: E119  . Lancets (ONETOUCH ULTRASOFT) lancets Use to test blood sugar up to three times daily. Dx E11.9  . losartan (COZAAR) 25 MG tablet TAKE 1 TABLET BY MOUTH EVERY DAY   . metoprolol tartrate (LOPRESSOR) 50 MG tablet TAKE 1 TABLET BY MOUTH TWICE A DAY  . montelukast (SINGULAIR) 10 MG tablet TAKE 1 TABLET BY MOUTH AT BEDTIME  . PARoxetine (PAXIL) 30 MG tablet Take one by mouth daily  . polyethylene glycol powder (GLYCOLAX/MIRALAX) powder Take 17 g by mouth every 3 (three) days. For constipation; hold for loose stools  . sitaGLIPtin (JANUVIA) 25 MG tablet Take 1 tablet (25 mg total) by mouth daily.  . traZODone (DESYREL) 50 MG tablet Take 1 tablet (50 mg total) by mouth at bedtime.  . diclofenac sodium (VOLTAREN) 1 % GEL Apply 2 g topically 4 (four) times daily as needed (shoulder pain).   No facility-administered encounter medications on file as of 01/25/2019.     Review of Systems:  Review of Systems  Constitutional: Negative for activity change, appetite change and fatigue.  HENT: Positive for hearing loss. Negative for dental problem, ear pain, tinnitus and trouble swallowing.   Respiratory: Positive for shortness of breath. Negative for cough and wheezing.   Cardiovascular: Negative for chest pain, palpitations and leg swelling.  Gastrointestinal: Negative for constipation, diarrhea and nausea.  Endocrine: Negative for polydipsia, polyphagia and polyuria.  Genitourinary: Negative for dysuria and hematuria.  Musculoskeletal: Positive for arthralgias, myalgias, neck pain and neck stiffness.  Skin: Negative.   Neurological: Negative for weakness, light-headedness, numbness and headaches.  Psychiatric/Behavioral: Negative for agitation, sleep disturbance and suicidal ideas. The patient is not nervous/anxious.     Health Maintenance  Topic Date Due  . FOOT EXAM  07/03/2018  . HEMOGLOBIN A1C  01/14/2019  . INFLUENZA VACCINE  02/06/2019  . OPHTHALMOLOGY EXAM  11/25/2019  . TETANUS/TDAP  08/11/2021  . PNA vac Low Risk Adult  Completed    Physical Exam: Vitals:   01/25/19 1431  BP: (!) 160/70  Pulse: 63  Temp: 98.2 F (36.8 C)  TempSrc: Oral   SpO2: 91%  Weight: 208 lb (94.3 kg)  Height: 5\' 7"  (1.702 m)   Body mass index is 32.58 kg/m. Physical Exam Constitutional:      General: He is not in acute distress.    Appearance: Normal appearance. He is normal weight. He is not ill-appearing.  HENT:     Head: Normocephalic.     Right Ear: Tympanic membrane normal. There is no impacted cerumen.     Left Ear: Tympanic membrane normal. There is no impacted cerumen.     Mouth/Throat:     Mouth: Mucous membranes are dry.  Neck:     Musculoskeletal: Normal range of motion. Muscular tenderness present. No neck rigidity.  Cardiovascular:     Rate and Rhythm: Normal rate and regular rhythm.     Pulses: Normal pulses.  Heart sounds: Normal heart sounds. No murmur.  Pulmonary:     Effort: Pulmonary effort is normal. No respiratory distress.     Breath sounds: Normal breath sounds. No wheezing.  Abdominal:     General: Bowel sounds are normal. There is distension.     Tenderness: There is no abdominal tenderness.  Musculoskeletal:        General: Tenderness present. No swelling or signs of injury.     Comments: Right shoulder with limited ROM and abduction. No pain with palpation over AC joint. Tenderness noted over right subscapularis. Left shoulder with full ROM, no pain on palpation of AC joint. Pain on palpation of left subscapularis area. No visible swelling on examination. No bruising, rashes or injuries present.   Skin:    General: Skin is warm and dry.     Capillary Refill: Capillary refill takes 2 to 3 seconds.  Neurological:     General: No focal deficit present.     Mental Status: He is alert and oriented to person, place, and time.  Psychiatric:        Mood and Affect: Mood normal.        Behavior: Behavior normal.     Labs reviewed: Basic Metabolic Panel: Recent Labs    04/09/18 0330 04/16/18 0858 07/16/18 1129  NA 137 138 141  K 4.7 4.8 4.8  CL 108 102 107  CO2 23 31 26   GLUCOSE 238* 234* 109*  BUN 62*  44* 42*  CREATININE 2.44* 2.12* 2.10*  CALCIUM 9.8 9.8 9.8   Liver Function Tests: Recent Labs    01/29/18 0809 07/16/18 1129  AST 17 15  ALT 13 13  BILITOT 0.4 0.4  PROT 6.2 6.1   No results for input(s): LIPASE, AMYLASE in the last 8760 hours. No results for input(s): AMMONIA in the last 8760 hours. CBC: Recent Labs    01/29/18 0809  04/16/18 0858 04/21/18 0957 07/16/18 1129  WBC 8.2   < > 15.0* 12.2* 8.2  NEUTROABS 5,223  --  11,955*  --  4,608  HGB 12.8*   < > 13.2 13.7 12.9*  HCT 38.3*   < > 39.5 40.0 37.5*  MCV 95.8   < > 97.1 95.9 96.4  PLT 160   < > 201 171 181   < > = values in this interval not displayed.   Lipid Panel: Recent Labs    01/29/18 0809 07/16/18 1129  CHOL 166 182  HDL 50 46  LDLCALC 97 104*  TRIG 91 206*  CHOLHDL 3.3 4.0   Lab Results  Component Value Date   HGBA1C 6.9 (H) 07/16/2018    Procedures since last visit: No results found.  Assessment/Plan 1. Chronic diastolic CHF (congestive heart failure) (HCC) - suspect orthopnea, but exam negative for respiratory congestion or ankle edema -advised to see pulmonary doctor if symptoms progress - reminded to use duoneb for shortness of breath epidoses -advised to try different sleeping pillow - continue to follow a diet low in sodium  2. Muscle pain, myofascial - apply heat to neck and shoulder area for muscle tightness and pain -encourage a new pillow to help with neck and shoulder pain - diclofenac sodium (VOLTAREN) 1 % GEL; Apply 2 g topically 4 (four) times daily as needed (shoulder pain).  Dispense: 150 g; Refill: 3  3. Type 2 diabetes mellitus with stage 3 chronic kidney disease, without long-term current use of insulin (HCC) - reduce bread, soda and juice intake - Basic metabolic  panel - Hemoglobin A1c -microalbumin-future  4. Chronic kidney disease (CKD) stage G3a/A2, moderately decreased glomerular filtration rate (GFR) between 45-59 mL/min/1.73 square meter and  albuminuria creatinine ratio between 30-299 mg/g (HCC) - recommend tylenol instead of ibuprofen for shoulder pain -avoid medications that are nephrotoxic like NSAIDS, promote renal dosages with medications  - CBC with Differential/Platelet - Basic metabolic panel -microalbumin- future  5. Hypertension associated with diabetes ([place]) - encourage BP log  - limit sodium intake  6. Pure hypercholesterolemia - limit foods high in fats and oils - continue statin therapy  - lipid panel-future  7. COPD with asthma ([place]) - can use duoneb for shortness of breath or wheezing - if shortness of breath is more frequent advised to follow up with pulmonary doctor or PCP     Labs/tests ordered:  CBC with differential, CMP with GFR, Hgb A1C   Next appt:  05/25/2019

## 2019-01-25 NOTE — Patient Instructions (Signed)
Heat 20 minutes at a time up to 4 times a day for the next week. Change pillow Use tylenol NOT ibuprofen for pain.    Try to cut down on your soda and orange juice.  Sodas are not good for your kidneys.

## 2019-01-25 NOTE — Addendum Note (Signed)
Addended by: Gayland Curry on: 01/25/2019 05:11 PM   Modules accepted: Orders

## 2019-01-26 ENCOUNTER — Telehealth: Payer: Self-pay | Admitting: Internal Medicine

## 2019-01-26 LAB — BASIC METABOLIC PANEL
BUN/Creatinine Ratio: 20 (calc) (ref 6–22)
BUN: 42 mg/dL — ABNORMAL HIGH (ref 7–25)
CO2: 27 mmol/L (ref 20–32)
Calcium: 9.6 mg/dL (ref 8.6–10.3)
Chloride: 107 mmol/L (ref 98–110)
Creat: 2.14 mg/dL — ABNORMAL HIGH (ref 0.70–1.11)
Glucose, Bld: 155 mg/dL — ABNORMAL HIGH (ref 65–139)
Potassium: 4.3 mmol/L (ref 3.5–5.3)
Sodium: 142 mmol/L (ref 135–146)

## 2019-01-26 LAB — CBC WITH DIFFERENTIAL/PLATELET
Absolute Monocytes: 1075 cells/uL — ABNORMAL HIGH (ref 200–950)
Basophils Absolute: 59 cells/uL (ref 0–200)
Basophils Relative: 0.7 %
Eosinophils Absolute: 235 cells/uL (ref 15–500)
Eosinophils Relative: 2.8 %
HCT: 35.9 % — ABNORMAL LOW (ref 38.5–50.0)
Hemoglobin: 12.1 g/dL — ABNORMAL LOW (ref 13.2–17.1)
Lymphs Abs: 2192 cells/uL (ref 850–3900)
MCH: 33.2 pg — ABNORMAL HIGH (ref 27.0–33.0)
MCHC: 33.7 g/dL (ref 32.0–36.0)
MCV: 98.6 fL (ref 80.0–100.0)
MPV: 10 fL (ref 7.5–12.5)
Monocytes Relative: 12.8 %
Neutro Abs: 4838 cells/uL (ref 1500–7800)
Neutrophils Relative %: 57.6 %
Platelets: 161 10*3/uL (ref 140–400)
RBC: 3.64 10*6/uL — ABNORMAL LOW (ref 4.20–5.80)
RDW: 13.1 % (ref 11.0–15.0)
Total Lymphocyte: 26.1 %
WBC: 8.4 10*3/uL (ref 3.8–10.8)

## 2019-01-26 LAB — HEMOGLOBIN A1C
Hgb A1c MFr Bld: 6.6 % of total Hgb — ABNORMAL HIGH (ref <5.7)
Mean Plasma Glucose: 143 (calc)
eAG (mmol/L): 7.9 (calc)

## 2019-01-26 NOTE — Telephone Encounter (Signed)
Patient called and wanted to let Dr. Mariea Clonts know that cream for his shoulders is not helping.  He is still having pain.  He is going to try heating pad.

## 2019-01-26 NOTE — Telephone Encounter (Signed)
Noted.  He must have used the cream once so far.  I will review his labs and we'll see if I can add any medication orally for a week for the pain.

## 2019-01-27 NOTE — Telephone Encounter (Signed)
Patient aware of Dr.[name]'s response and will await lab results

## 2019-01-28 ENCOUNTER — Telehealth: Payer: Self-pay | Admitting: Internal Medicine

## 2019-01-28 ENCOUNTER — Encounter: Payer: Self-pay | Admitting: *Deleted

## 2019-01-28 NOTE — Telephone Encounter (Signed)
Patient returned phone call.  He asked that if he is not able to answer to please leave him a detailed message.

## 2019-01-28 NOTE — Telephone Encounter (Signed)
See result note.  

## 2019-02-08 ENCOUNTER — Other Ambulatory Visit: Payer: Self-pay

## 2019-02-08 ENCOUNTER — Encounter (INDEPENDENT_AMBULATORY_CARE_PROVIDER_SITE_OTHER): Payer: PPO | Admitting: Ophthalmology

## 2019-02-08 DIAGNOSIS — H348322 Tributary (branch) retinal vein occlusion, left eye, stable: Secondary | ICD-10-CM

## 2019-02-08 DIAGNOSIS — H43813 Vitreous degeneration, bilateral: Secondary | ICD-10-CM

## 2019-02-08 DIAGNOSIS — H2513 Age-related nuclear cataract, bilateral: Secondary | ICD-10-CM

## 2019-02-08 DIAGNOSIS — I10 Essential (primary) hypertension: Secondary | ICD-10-CM | POA: Diagnosis not present

## 2019-02-08 DIAGNOSIS — H353211 Exudative age-related macular degeneration, right eye, with active choroidal neovascularization: Secondary | ICD-10-CM

## 2019-02-08 DIAGNOSIS — H35033 Hypertensive retinopathy, bilateral: Secondary | ICD-10-CM

## 2019-02-15 ENCOUNTER — Other Ambulatory Visit: Payer: Self-pay | Admitting: Internal Medicine

## 2019-02-25 NOTE — Progress Notes (Signed)
Reviewed and agree with assessment/plan.   Rhiley Tarver, MD [facility] 07/03/2016, 12:24 PM Pager:  [phone]  

## 2019-03-01 ENCOUNTER — Other Ambulatory Visit: Payer: Self-pay | Admitting: Internal Medicine

## 2019-03-01 MED ORDER — PAROXETINE HCL 30 MG PO TABS: 30.0000 mg | ORAL_TABLET | Freq: Every day | ORAL | 1 refills | Status: DC

## 2019-03-05 ENCOUNTER — Other Ambulatory Visit: Payer: Self-pay | Admitting: Internal Medicine

## 2019-03-08 ENCOUNTER — Encounter (INDEPENDENT_AMBULATORY_CARE_PROVIDER_SITE_OTHER): Payer: PPO | Admitting: Ophthalmology

## 2019-03-08 ENCOUNTER — Other Ambulatory Visit: Payer: Self-pay

## 2019-03-08 DIAGNOSIS — H35033 Hypertensive retinopathy, bilateral: Secondary | ICD-10-CM

## 2019-03-08 DIAGNOSIS — I10 Essential (primary) hypertension: Secondary | ICD-10-CM | POA: Diagnosis not present

## 2019-03-08 DIAGNOSIS — H353211 Exudative age-related macular degeneration, right eye, with active choroidal neovascularization: Secondary | ICD-10-CM

## 2019-03-08 DIAGNOSIS — H348322 Tributary (branch) retinal vein occlusion, left eye, stable: Secondary | ICD-10-CM

## 2019-03-08 DIAGNOSIS — H43813 Vitreous degeneration, bilateral: Secondary | ICD-10-CM | POA: Diagnosis not present

## 2019-03-24 ENCOUNTER — Encounter: Payer: Self-pay | Admitting: *Deleted

## 2019-03-24 NOTE — Telephone Encounter (Signed)
error 

## 2019-03-25 ENCOUNTER — Other Ambulatory Visit: Payer: Self-pay | Admitting: *Deleted

## 2019-03-25 MED ORDER — BUDESONIDE-FORMOTEROL FUMARATE 160-4.5 MCG/ACT IN AERO: INHALATION_SPRAY | RESPIRATORY_TRACT | 0 refills | Status: DC

## 2019-03-25 NOTE — Telephone Encounter (Signed)
Patient requested refill. [place].

## 2019-03-27 ENCOUNTER — Other Ambulatory Visit: Payer: Self-pay | Admitting: Internal Medicine

## 2019-03-30 ENCOUNTER — Other Ambulatory Visit: Payer: Self-pay | Admitting: Internal Medicine

## 2019-03-30 NOTE — Telephone Encounter (Signed)
Requested RX was approved on 03/25/2019 (5 days ago)

## 2019-04-05 ENCOUNTER — Encounter (INDEPENDENT_AMBULATORY_CARE_PROVIDER_SITE_OTHER): Payer: PPO | Admitting: Ophthalmology

## 2019-04-05 ENCOUNTER — Other Ambulatory Visit: Payer: Self-pay

## 2019-04-05 DIAGNOSIS — H353211 Exudative age-related macular degeneration, right eye, with active choroidal neovascularization: Secondary | ICD-10-CM

## 2019-04-05 DIAGNOSIS — H35033 Hypertensive retinopathy, bilateral: Secondary | ICD-10-CM | POA: Diagnosis not present

## 2019-04-05 DIAGNOSIS — H43813 Vitreous degeneration, bilateral: Secondary | ICD-10-CM | POA: Diagnosis not present

## 2019-04-05 DIAGNOSIS — H348322 Tributary (branch) retinal vein occlusion, left eye, stable: Secondary | ICD-10-CM | POA: Diagnosis not present

## 2019-04-05 DIAGNOSIS — I10 Essential (primary) hypertension: Secondary | ICD-10-CM

## 2019-04-05 DIAGNOSIS — H2513 Age-related nuclear cataract, bilateral: Secondary | ICD-10-CM | POA: Diagnosis not present

## 2019-04-15 ENCOUNTER — Encounter: Payer: Self-pay | Admitting: Adult Health

## 2019-04-15 ENCOUNTER — Other Ambulatory Visit: Payer: Self-pay

## 2019-04-15 ENCOUNTER — Ambulatory Visit (INDEPENDENT_AMBULATORY_CARE_PROVIDER_SITE_OTHER): Payer: PPO | Admitting: Adult Health

## 2019-04-15 DIAGNOSIS — S161XXA Strain of muscle, fascia and tendon at neck level, initial encounter: Secondary | ICD-10-CM

## 2019-04-15 NOTE — Patient Instructions (Signed)
Strain of neck muscle, initial encounter  -  Continue Tylenol ES 500 mg 2 tabs every 8 hours as needed for pain and Voltaren ge 1% apply 2 gm to right back of neck topically four times daily as needed.  Call/follow-up if pain persists in 1 week

## 2019-04-15 NOTE — Progress Notes (Signed)
[facility]   Provider:  Durenda Age - NP  Code Status:  DNR  Goals of Care:  Advanced Directives 07/06/2018  Does Patient Have a Medical Advance Directive? Yes  Type of Paramedic of [place];Living will  Does patient want to make changes to medical advance directive? No - Patient declined  Copy of [place] in Chart? Yes - validated most recent copy scanned in chart (See row information)  Pre-existing out of facility DNR order (yellow form or pink MOST form) -     Chief Complaint  Patient presents with  . Acute Visit    C/o - bilateral pain in neck     HPI: Patient is a 83 y.o. male seen today for an acute visit for right side of neck pain. A week ago, he tried to reach for the heating pad on the floor but when he bent over, his neck started hurting. He rates his pain as 5/10 but gets more painful when moving his neck. He takes Tylenol ES PRN (average once a day).He has PMH of alzheimer's disease, anxiety, diabetes mellitus and COPD.   Past Medical History:  Diagnosis Date  . Allergy   . Alzheimer's disease ([place])   . Anemia, unspecified   . Anxiety   . Chronic maxillary sinusitis   . COPD (chronic obstructive pulmonary disease) ([place])   . Depression   . Diabetes mellitus   . Edema   . Hypercalcemia   . Hyperlipidemia   . Hypertension   . Hypertrophy of prostate without urinary obstruction and other lower urinary tract symptoms (LUTS)   . Hypertrophy of prostate without urinary obstruction and other lower urinary tract symptoms (LUTS)   . Kidney disease    mild  . Unspecified disorder of kidney and ureter   . Unspecified sleep apnea   . Ventral hernia, unspecified, without mention of obstruction or gangrene     Past Surgical History:  Procedure Laterality Date  . EXPLORATORY LAPAROTOMY  2002  . HERNIA REPAIR      Allergies  Allergen Reactions  . Ace Inhibitors Cough    Outpatient Encounter Medications as  of 04/15/2019  Medication Sig  . Acetaminophen-Codeine 300-30 MG tablet Take 1 tablet by mouth every 6 (six) hours as needed. for pain  . albuterol (PROVENTIL HFA;VENTOLIN HFA) 108 (90 Base) MCG/ACT inhaler Inhale 2 puffs into the lungs every 6 (six) hours as needed for wheezing or shortness of breath.  Marland Kitchen amLODipine (NORVASC) 10 MG tablet TAKE 1 TABLET BY MOUTH DAILY  . aspirin (ASPIRIN LOW DOSE) 81 MG EC tablet Take 1 tablet (81 mg total) by mouth daily. DX: I50.32  . atorvastatin (LIPITOR) 40 MG tablet TAKE 1 TABLET BY MOUTH EVERY DAY  . BESIVANCE 0.6 % SUSP INSTILL ONE DROP INTO RIGHT EYE 4 TIMES A DAY FOR 2 DAYS AFTER EACH MONTHLY EYE INJECTION  . budesonide-formoterol (SYMBICORT) 160-4.5 MCG/ACT inhaler Take two puffs by mouth twice daily  . diclofenac sodium (VOLTAREN) 1 % GEL Apply 2 g topically 4 (four) times daily as needed (shoulder pain).  . flurbiprofen (ANSAID) 100 MG tablet Take 100 mg by mouth 3 (three) times daily.  . fluticasone (FLONASE) 50 MCG/ACT nasal spray Place 2 sprays into both nostrils daily.  Marland Kitchen glucose blood (ONE TOUCH ULTRA TEST) test strip Use to test blood sugar twice daily.  DX: E11.9  . hydrALAZINE (APRESOLINE) 25 MG tablet Take 1 tablet (25 mg total) by mouth 2 (two) times daily.  Marland Kitchen  ipratropium-albuterol (DUONEB) 0.5-2.5 (3) MG/3ML SOLN Take 3 mLs by nebulization every 6 (six) hours as needed (wheezing).  Elmore Guise Devices (ONE TOUCH DELICA LANCING DEV) MISC Use to test blood sugar once daily dx: E119  . Lancets (ONETOUCH ULTRASOFT) lancets Use to test blood sugar up to three times daily. Dx E11.9  . losartan (COZAAR) 25 MG tablet TAKE 1 TABLET BY MOUTH EVERY DAY  . metoprolol tartrate (LOPRESSOR) 50 MG tablet TAKE 1 TABLET BY MOUTH TWICE A DAY  . montelukast (SINGULAIR) 10 MG tablet TAKE 1 TABLET BY MOUTH AT BEDTIME  . PARoxetine (PAXIL) 30 MG tablet Take 1 tablet (30 mg total) by mouth daily.  . polyethylene glycol powder (GLYCOLAX/MIRALAX) powder Take 17 g by  mouth every 3 (three) days. For constipation; hold for loose stools  . sitaGLIPtin (JANUVIA) 25 MG tablet Take 1 tablet (25 mg total) by mouth daily.  . traZODone (DESYREL) 50 MG tablet Take 1 tablet (50 mg total) by mouth at bedtime.  Marland Kitchen trimethoprim-polymyxin b (POLYTRIM) ophthalmic solution INSTILL ONE DROP INTO RIGHT EYE 4 TIMES A DAY FOR 2 DAYS AFTER EACH MONTHLY EYE INJECTION   No facility-administered encounter medications on file as of 04/15/2019.     Review of Systems:  Review of Systems  Constitutional: Negative.  Negative for activity change.  HENT: Negative.   Respiratory: Negative.   Gastrointestinal: Negative.   Genitourinary: Negative.   Musculoskeletal: Positive for neck pain. Negative for joint swelling.  Skin: Negative.   Neurological: Negative.     Health Maintenance  Topic Date Due  . FOOT EXAM  07/03/2018  . INFLUENZA VACCINE  02/06/2019  . HEMOGLOBIN A1C  07/28/2019  . OPHTHALMOLOGY EXAM  11/25/2019  . TETANUS/TDAP  08/11/2021  . PNA vac Low Risk Adult  Completed    Physical Exam: Vitals:   04/15/19 1041  BP: 138/62  Pulse: 61  Resp: 20  Temp: 97.7 F (36.5 C)  SpO2: 92%  Weight: 198 lb 3.2 oz (89.9 kg)  Height: 5\' 7"  (1.702 m)   Body mass index is 31.04 kg/m. Physical Exam Vitals signs reviewed.  Constitutional:      Appearance: Normal appearance. He is obese.  HENT:     Head: Normocephalic.     Mouth/Throat:     Mouth: Mucous membranes are moist.     Pharynx: Oropharynx is clear.  Neck:     Musculoskeletal: Muscular tenderness present.  Cardiovascular:     Rate and Rhythm: Normal rate and regular rhythm.  Pulmonary:     Effort: Pulmonary effort is normal.     Breath sounds: Normal breath sounds.  Abdominal:     General: Bowel sounds are normal.     Palpations: Abdomen is soft.  Lymphadenopathy:     Cervical: No cervical adenopathy.  Neurological:     Mental Status: He is alert.     Labs reviewed: Basic Metabolic Panel:  Recent Labs    04/16/18 0858 07/16/18 1129 01/25/19 1524  NA 138 141 142  K 4.8 4.8 4.3  CL 102 107 107  CO2 31 26 27   GLUCOSE 234* 109* 155*  BUN 44* 42* 42*  CREATININE 2.12* 2.10* 2.14*  CALCIUM 9.8 9.8 9.6   Liver Function Tests: Recent Labs    07/16/18 1129  AST 15  ALT 13  BILITOT 0.4  PROT 6.1   CBC: Recent Labs    04/16/18 0858 04/21/18 0957 07/16/18 1129 01/25/19 1524  WBC 15.0* 12.2* 8.2 8.4  NEUTROABS 11,955*  --  4,608 4,838  HGB 13.2 13.7 12.9* 12.1*  HCT 39.5 40.0 37.5* 35.9*  MCV 97.1 95.9 96.4 98.6  PLT 201 171 181 161   Lipid Panel: Recent Labs    07/16/18 1129  CHOL 182  HDL 46  LDLCALC 104*  TRIG 206*  CHOLHDL 4.0   Lab Results  Component Value Date   HGBA1C 6.6 (H) 01/25/2019     Assessment/Plan  1. Strain of neck muscle, initial encounter -  Continue Tylenol ES 500 mg 2 tabs every 8 hours as needed for pain and Voltaren gel: 1% apply 2 gm to right back of neck topically four times daily as needed.    Labs/tests ordered:  None  Next appt:  05/25/2019  [place], FNP-BC, MSN, RN [phone] [facility]

## 2019-04-16 ENCOUNTER — Other Ambulatory Visit: Payer: Self-pay

## 2019-04-16 ENCOUNTER — Telehealth: Payer: Self-pay

## 2019-04-16 MED ORDER — BUDESONIDE-FORMOTEROL FUMARATE 160-4.5 MCG/ACT IN AERO: INHALATION_SPRAY | RESPIRATORY_TRACT | 0 refills | Status: DC

## 2019-04-16 NOTE — Telephone Encounter (Signed)
Patient called to request a refill  [facility] took call but her Internet was done so I finished the call and let patient know it had been sent to pharmacy

## 2019-04-19 ENCOUNTER — Other Ambulatory Visit: Payer: Self-pay | Admitting: Internal Medicine

## 2019-05-03 ENCOUNTER — Other Ambulatory Visit: Payer: Self-pay

## 2019-05-03 ENCOUNTER — Encounter (INDEPENDENT_AMBULATORY_CARE_PROVIDER_SITE_OTHER): Payer: PPO | Admitting: Ophthalmology

## 2019-05-03 DIAGNOSIS — H35033 Hypertensive retinopathy, bilateral: Secondary | ICD-10-CM | POA: Diagnosis not present

## 2019-05-03 DIAGNOSIS — H353211 Exudative age-related macular degeneration, right eye, with active choroidal neovascularization: Secondary | ICD-10-CM

## 2019-05-03 DIAGNOSIS — H2513 Age-related nuclear cataract, bilateral: Secondary | ICD-10-CM

## 2019-05-03 DIAGNOSIS — I10 Essential (primary) hypertension: Secondary | ICD-10-CM

## 2019-05-03 DIAGNOSIS — H348322 Tributary (branch) retinal vein occlusion, left eye, stable: Secondary | ICD-10-CM | POA: Diagnosis not present

## 2019-05-03 DIAGNOSIS — H43813 Vitreous degeneration, bilateral: Secondary | ICD-10-CM | POA: Diagnosis not present

## 2019-05-10 ENCOUNTER — Other Ambulatory Visit: Payer: Self-pay | Admitting: Internal Medicine

## 2019-05-25 ENCOUNTER — Other Ambulatory Visit: Payer: PPO

## 2019-05-26 ENCOUNTER — Other Ambulatory Visit: Payer: PPO

## 2019-05-26 ENCOUNTER — Other Ambulatory Visit: Payer: Self-pay | Admitting: Internal Medicine

## 2019-05-26 ENCOUNTER — Other Ambulatory Visit: Payer: Self-pay

## 2019-05-26 DIAGNOSIS — N183 Chronic kidney disease, stage 3 unspecified: Secondary | ICD-10-CM | POA: Diagnosis not present

## 2019-05-26 DIAGNOSIS — I5032 Chronic diastolic (congestive) heart failure: Secondary | ICD-10-CM | POA: Diagnosis not present

## 2019-05-26 DIAGNOSIS — N1831 Chronic kidney disease, stage 3a: Secondary | ICD-10-CM | POA: Diagnosis not present

## 2019-05-26 DIAGNOSIS — E1122 Type 2 diabetes mellitus with diabetic chronic kidney disease: Secondary | ICD-10-CM | POA: Diagnosis not present

## 2019-05-26 DIAGNOSIS — E7800 Pure hypercholesterolemia, unspecified: Secondary | ICD-10-CM | POA: Diagnosis not present

## 2019-05-26 NOTE — Telephone Encounter (Signed)
Routing to provider for approval on trazodone refill. Medication has allergy / contraindication alert warning.

## 2019-05-27 ENCOUNTER — Other Ambulatory Visit: Payer: Self-pay

## 2019-05-27 ENCOUNTER — Ambulatory Visit (INDEPENDENT_AMBULATORY_CARE_PROVIDER_SITE_OTHER): Payer: PPO | Admitting: Internal Medicine

## 2019-05-27 ENCOUNTER — Encounter: Payer: Self-pay | Admitting: Internal Medicine

## 2019-05-27 VITALS — BP 140/62 | HR 65 | Temp 97.5°F | Resp 20 | Ht 67.0 in | Wt 198.8 lb

## 2019-05-27 DIAGNOSIS — N1832 Chronic kidney disease, stage 3b: Secondary | ICD-10-CM

## 2019-05-27 DIAGNOSIS — Z66 Do not resuscitate: Secondary | ICD-10-CM | POA: Diagnosis not present

## 2019-05-27 DIAGNOSIS — G4700 Insomnia, unspecified: Secondary | ICD-10-CM

## 2019-05-27 DIAGNOSIS — I10 Essential (primary) hypertension: Secondary | ICD-10-CM | POA: Diagnosis not present

## 2019-05-27 DIAGNOSIS — Z23 Encounter for immunization: Secondary | ICD-10-CM | POA: Diagnosis not present

## 2019-05-27 DIAGNOSIS — I5032 Chronic diastolic (congestive) heart failure: Secondary | ICD-10-CM

## 2019-05-27 DIAGNOSIS — J449 Chronic obstructive pulmonary disease, unspecified: Secondary | ICD-10-CM

## 2019-05-27 DIAGNOSIS — I152 Hypertension secondary to endocrine disorders: Secondary | ICD-10-CM

## 2019-05-27 DIAGNOSIS — J4489 Other specified chronic obstructive pulmonary disease: Secondary | ICD-10-CM

## 2019-05-27 DIAGNOSIS — E1121 Type 2 diabetes mellitus with diabetic nephropathy: Secondary | ICD-10-CM

## 2019-05-27 DIAGNOSIS — E7800 Pure hypercholesterolemia, unspecified: Secondary | ICD-10-CM | POA: Diagnosis not present

## 2019-05-27 DIAGNOSIS — E1122 Type 2 diabetes mellitus with diabetic chronic kidney disease: Secondary | ICD-10-CM

## 2019-05-27 DIAGNOSIS — E1159 Type 2 diabetes mellitus with other circulatory complications: Secondary | ICD-10-CM

## 2019-05-27 LAB — CBC WITH DIFFERENTIAL/PLATELET
Absolute Monocytes: 760 cells/uL (ref 200–950)
Basophils Absolute: 61 cells/uL (ref 0–200)
Basophils Relative: 0.8 %
Eosinophils Absolute: 258 cells/uL (ref 15–500)
Eosinophils Relative: 3.4 %
HCT: 37.8 % — ABNORMAL LOW (ref 38.5–50.0)
Hemoglobin: 12.2 g/dL — ABNORMAL LOW (ref 13.2–17.1)
Lymphs Abs: 1923 cells/uL (ref 850–3900)
MCH: 31.4 pg (ref 27.0–33.0)
MCHC: 32.3 g/dL (ref 32.0–36.0)
MCV: 97.2 fL (ref 80.0–100.0)
MPV: 10.2 fL (ref 7.5–12.5)
Monocytes Relative: 10 %
Neutro Abs: 4598 cells/uL (ref 1500–7800)
Neutrophils Relative %: 60.5 %
Platelets: 163 10*3/uL (ref 140–400)
RBC: 3.89 10*6/uL — ABNORMAL LOW (ref 4.20–5.80)
RDW: 13.9 % (ref 11.0–15.0)
Total Lymphocyte: 25.3 %
WBC: 7.6 10*3/uL (ref 3.8–10.8)

## 2019-05-27 LAB — COMPLETE METABOLIC PANEL WITH GFR
AG Ratio: 1.5 (calc) (ref 1.0–2.5)
ALT: 12 U/L (ref 9–46)
AST: 15 U/L (ref 10–35)
Albumin: 3.7 g/dL (ref 3.6–5.1)
Alkaline phosphatase (APISO): 67 U/L (ref 35–144)
BUN/Creatinine Ratio: 21 (calc) (ref 6–22)
BUN: 43 mg/dL — ABNORMAL HIGH (ref 7–25)
CO2: 28 mmol/L (ref 20–32)
Calcium: 9.6 mg/dL (ref 8.6–10.3)
Chloride: 106 mmol/L (ref 98–110)
Creat: 2.06 mg/dL — ABNORMAL HIGH (ref 0.70–1.11)
GFR, Est African American: 34 mL/min/{1.73_m2} — ABNORMAL LOW (ref >=60)
GFR, Est Non African American: 29 mL/min/{1.73_m2} — ABNORMAL LOW (ref >=60)
Globulin: 2.4 g/dL (calc) (ref 1.9–3.7)
Glucose, Bld: 144 mg/dL — ABNORMAL HIGH (ref 65–99)
Potassium: 4.5 mmol/L (ref 3.5–5.3)
Sodium: 140 mmol/L (ref 135–146)
Total Bilirubin: 0.4 mg/dL (ref 0.2–1.2)
Total Protein: 6.1 g/dL (ref 6.1–8.1)

## 2019-05-27 LAB — LIPID PANEL
Cholesterol: 176 mg/dL (ref <200)
HDL: 43 mg/dL (ref >=40)
LDL Cholesterol (Calc): 107 mg/dL (calc) — ABNORMAL HIGH
Non-HDL Cholesterol (Calc): 133 mg/dL (calc) — ABNORMAL HIGH (ref <130)
Total CHOL/HDL Ratio: 4.1 (calc) (ref <5.0)
Triglycerides: 144 mg/dL (ref <150)

## 2019-05-27 LAB — HEMOGLOBIN A1C
Hgb A1c MFr Bld: 6.6 % of total Hgb — ABNORMAL HIGH (ref <5.7)
Mean Plasma Glucose: 143 (calc)
eAG (mmol/L): 7.9 (calc)

## 2019-05-27 MED ORDER — ALBUTEROL SULFATE HFA 108 (90 BASE) MCG/ACT IN AERS: 2.0000 | INHALATION_SPRAY | Freq: Four times a day (QID) | RESPIRATORY_TRACT | 3 refills | Status: DC | PRN

## 2019-05-27 NOTE — Progress Notes (Signed)
Location:  [facility] clinic Provider:  Toivo Bordon L. Mariea Clonts, D.O., C.M.D.  Code Status: DNR Goals of Care:  Advanced Directives 05/27/2019  Does Patient Have a Medical Advance Directive? Yes  Type of Advance Directive -  Does patient want to make changes to medical advance directive? No - Patient declined  Copy of [place] in Chart? -  Pre-existing out of facility DNR order (yellow form or pink MOST form) -     Chief Complaint  Patient presents with  . Medical Management of Chronic Issues    4 Month Follow Up    HPI: Patient is a 83 y.o. male seen today for medical management of chronic diseases.    He's been walking, goes to church and [facility] [employer].    His hba1c was 6.6.  Kidneys are stable.  He's trying to stay off of "them sodas".  Drinks a lot of water.    Neck pain has resolved.     Sleeping ok here lately.  He does not think he needs to take the sleeping pill--trazodone--we discussed cutting it in half for a while to see if he still sleeps well. Past Medical History:  Diagnosis Date  . Allergy   . Alzheimer's disease ([place])   . Anemia, unspecified   . Anxiety   . Chronic maxillary sinusitis   . COPD (chronic obstructive pulmonary disease) ([place])   . Depression   . Diabetes mellitus   . Edema   . Hypercalcemia   . Hyperlipidemia   . Hypertension   . Hypertrophy of prostate without urinary obstruction and other lower urinary tract symptoms (LUTS)   . Hypertrophy of prostate without urinary obstruction and other lower urinary tract symptoms (LUTS)   . Kidney disease    mild  . Unspecified disorder of kidney and ureter   . Unspecified sleep apnea   . Ventral hernia, unspecified, without mention of obstruction or gangrene     Past Surgical History:  Procedure Laterality Date  . EXPLORATORY LAPAROTOMY  2002  . HERNIA REPAIR      Allergies  Allergen Reactions  . Ace Inhibitors Cough    Outpatient Encounter Medications as of 05/27/2019    Medication Sig  . acetaminophen (TYLENOL) 500 MG tablet Take 1,000 mg by mouth every 8 (eight) hours as needed.  Marland Kitchen albuterol (VENTOLIN HFA) 108 (90 Base) MCG/ACT inhaler Inhale 2 puffs into the lungs every 6 (six) hours as needed for wheezing or shortness of breath.  Marland Kitchen amLODipine (NORVASC) 10 MG tablet TAKE 1 TABLET BY MOUTH DAILY  . aspirin (ASPIRIN LOW DOSE) 81 MG EC tablet Take 1 tablet (81 mg total) by mouth daily. DX: I50.32  . atorvastatin (LIPITOR) 40 MG tablet TAKE 1 TABLET BY MOUTH EVERY DAY  . BESIVANCE 0.6 % SUSP INSTILL ONE DROP INTO RIGHT EYE 4 TIMES A DAY FOR 2 DAYS AFTER EACH MONTHLY EYE INJECTION  . budesonide-formoterol (SYMBICORT) 160-4.5 MCG/ACT inhaler TAKE TWO PUFFS BY MOUTH TWICE DAILY  . diclofenac sodium (VOLTAREN) 1 % GEL Apply 2 g topically 4 (four) times daily as needed (shoulder pain).  . flurbiprofen (ANSAID) 100 MG tablet Take 100 mg by mouth 3 (three) times daily.  . fluticasone (FLONASE) 50 MCG/ACT nasal spray Place 2 sprays into both nostrils daily.  Marland Kitchen glucose blood (ONE TOUCH ULTRA TEST) test strip Use to test blood sugar twice daily.  DX: E11.9  . ipratropium-albuterol (DUONEB) 0.5-2.5 (3) MG/3ML SOLN Take 3 mLs by nebulization every 6 (six)  hours as needed (wheezing).  Elmore Guise Devices (ONE TOUCH DELICA LANCING DEV) MISC Use to test blood sugar once daily dx: E119  . Lancets (ONETOUCH ULTRASOFT) lancets Use to test blood sugar up to three times daily. Dx E11.9  . losartan (COZAAR) 25 MG tablet TAKE 1 TABLET BY MOUTH EVERY DAY  . metoprolol tartrate (LOPRESSOR) 50 MG tablet TAKE 1 TABLET BY MOUTH TWICE A DAY  . montelukast (SINGULAIR) 10 MG tablet TAKE 1 TABLET BY MOUTH AT BEDTIME  . PARoxetine (PAXIL) 30 MG tablet Take 1 tablet (30 mg total) by mouth daily.  . polyethylene glycol powder (GLYCOLAX/MIRALAX) powder Take 17 g by mouth every 3 (three) days. For constipation; hold for loose stools  . sitaGLIPtin (JANUVIA) 25 MG tablet Take 1 tablet (25 mg total) by  mouth daily.  Marland Kitchen trimethoprim-polymyxin b (POLYTRIM) ophthalmic solution INSTILL ONE DROP INTO RIGHT EYE 4 TIMES A DAY FOR 2 DAYS AFTER EACH MONTHLY EYE INJECTION  . [DISCONTINUED] Acetaminophen-Codeine 300-30 MG tablet Take 1 tablet by mouth every 6 (six) hours as needed. for pain  . [DISCONTINUED] albuterol (PROVENTIL HFA;VENTOLIN HFA) 108 (90 Base) MCG/ACT inhaler Inhale 2 puffs into the lungs every 6 (six) hours as needed for wheezing or shortness of breath.  . [DISCONTINUED] hydrALAZINE (APRESOLINE) 25 MG tablet Take 1 tablet (25 mg total) by mouth 2 (two) times daily.  . [DISCONTINUED] traZODone (DESYREL) 50 MG tablet Take 1 tablet (50 mg total) by mouth at bedtime.   No facility-administered encounter medications on file as of 05/27/2019.     Review of Systems:  Review of Systems  Constitutional: Negative for chills, fever and malaise/fatigue.  HENT: Positive for hearing loss. Negative for congestion and sore throat.   Eyes: Negative for blurred vision.       Glasses  Respiratory: Negative for cough and shortness of breath.   Cardiovascular: Negative for chest pain, palpitations and leg swelling.  Gastrointestinal: Negative for abdominal pain, blood in stool, constipation, diarrhea and melena.  Genitourinary: Negative for dysuria.  Musculoskeletal: Positive for myalgias. Negative for falls.       Improved neck pain  Skin: Negative for itching and rash.  Neurological: Negative for dizziness and loss of consciousness.  Endo/Heme/Allergies: Does not bruise/bleed easily.  Psychiatric/Behavioral: Positive for memory loss. Negative for depression. The patient is not nervous/anxious and does not have insomnia.     Health Maintenance  Topic Date Due  . INFLUENZA VACCINE  02/06/2019  . HEMOGLOBIN A1C  11/23/2019  . OPHTHALMOLOGY EXAM  11/25/2019  . FOOT EXAM  05/26/2020  . TETANUS/TDAP  08/11/2021  . PNA vac Low Risk Adult  Completed    Physical Exam: Vitals:   05/27/19 1302  05/27/19 1435  BP: 140/72 140/62  Pulse: 65   Resp: 20   Temp: (!) 97.5 F (36.4 C)   TempSrc: Oral   SpO2: 93%   Weight: 198 lb 12.8 oz (90.2 kg)   Height: 5\' 7"  (1.702 m)    Body mass index is 31.14 kg/m. Physical Exam Vitals signs reviewed.  Constitutional:      General: He is not in acute distress.    Appearance: Normal appearance. He is obese. He is not toxic-appearing.  HENT:     Head: Normocephalic and atraumatic.     Ears:     Comments: HOH Eyes:     Extraocular Movements: Extraocular movements intact.     Pupils: Pupils are equal, round, and reactive to light.     Comments: glasses  Cardiovascular:     Rate and Rhythm: Normal rate and regular rhythm.     Pulses: Normal pulses.     Heart sounds: Normal heart sounds.  Pulmonary:     Effort: Pulmonary effort is normal.     Breath sounds: Normal breath sounds. No wheezing, rhonchi or rales.  Musculoskeletal: Normal range of motion.     Right lower leg: No edema.     Left lower leg: No edema.  Skin:    General: Skin is warm and dry.  Neurological:     General: No focal deficit present.     Mental Status: He is alert and oriented to person, place, and time.  Psychiatric:        Mood and Affect: Mood normal.        Behavior: Behavior normal.     Labs reviewed: Basic Metabolic Panel: Recent Labs    07/16/18 1129 01/25/19 1524 05/26/19 0826  NA 141 142 140  K 4.8 4.3 4.5  CL 107 107 106  CO2 26 27 28   GLUCOSE 109* 155* 144*  BUN 42* 42* 43*  CREATININE 2.10* 2.14* 2.06*  CALCIUM 9.8 9.6 9.6   Liver Function Tests: Recent Labs    07/16/18 1129 05/26/19 0826  AST 15 15  ALT 13 12  BILITOT 0.4 0.4  PROT 6.1 6.1   No results for input(s): LIPASE, AMYLASE in the last 8760 hours. No results for input(s): AMMONIA in the last 8760 hours. CBC: Recent Labs    07/16/18 1129 01/25/19 1524 05/26/19 0826  WBC 8.2 8.4 7.6  NEUTROABS 4,608 4,838 4,598  HGB 12.9* 12.1* 12.2*  HCT 37.5* 35.9* 37.8*    MCV 96.4 98.6 97.2  PLT 181 161 163   Lipid Panel: Recent Labs    07/16/18 1129 05/26/19 0826  CHOL 182 176  HDL 46 43  LDLCALC 104* 107*  TRIG 206* 144  CHOLHDL 4.0 4.1   Lab Results  Component Value Date   HGBA1C 6.6 (H) 05/26/2019   Assessment/Plan 1. Type 2 diabetes mellitus with stage 3b chronic kidney disease, without long-term current use of insulin (HCC) -has been well controlled -is back to regular exercise program -tries to eat healthy diet  2. Chronic diastolic CHF (congestive heart failure) (HCC) -no signs of acute exacerbation, cont ARB and low dose short-acting lopressor, not on regular diuretic regimen  3. Hypertension associated with diabetes ([place]) -bp just slightly above goal upon arrival at 140/60--recheck:  140/62  4. Pure hypercholesterolemia -continues on lipitor 40mg  daily, LDL just over 100--may consider change to crestor next visit   5. COPD with asthma ([place]) -cont symbicort, singulair and prn albuterol--needed refill on prn albuterol - albuterol (VENTOLIN HFA) 108 (90 Base) MCG/ACT inhaler; Inhale 2 puffs into the lungs every 6 (six) hours as needed for wheezing or shortness of breath.  Dispense: 18 g; Refill: 3  6. Insomnia, unspecified type -sleeping better these days as adjusting to wife having passed away -reduce trazodone to 25mg  (1/2 tab of 50mg ) for a few weeks, then may try stopping b/c he does not feel like he needs this anymore  7. Need for influenza vaccination -high dose flu shot given  Labs/tests ordered:   Lab Orders     Hemoglobin A1c     Basic metabolic panel     Lipid panel     CBC with Differential/Platelet  Next appt:  4 mos med mgt   Kinston Magnan L. Santiel Topper, D.O. [facility] [address], [place] [address] Cell Phone (Mon-Fri 8am-5pm):  [phone] On Call:  [phone] & follow prompts after 5pm & weekends Office Phone:  [phone] Office Fax:   [phone]

## 2019-05-27 NOTE — Patient Instructions (Signed)
Cut your trazodone pills in half to take for sleep.  If that's working for a couple of weeks, you may try to stop it altogether.

## 2019-06-02 ENCOUNTER — Other Ambulatory Visit: Payer: Self-pay | Admitting: *Deleted

## 2019-06-02 MED ORDER — GLUCOSE BLOOD VI STRP: ORAL_STRIP | 3 refills | Status: DC

## 2019-06-07 ENCOUNTER — Other Ambulatory Visit: Payer: Self-pay

## 2019-06-07 ENCOUNTER — Encounter (INDEPENDENT_AMBULATORY_CARE_PROVIDER_SITE_OTHER): Payer: PPO | Admitting: Ophthalmology

## 2019-06-07 DIAGNOSIS — I10 Essential (primary) hypertension: Secondary | ICD-10-CM

## 2019-06-07 DIAGNOSIS — H353211 Exudative age-related macular degeneration, right eye, with active choroidal neovascularization: Secondary | ICD-10-CM | POA: Diagnosis not present

## 2019-06-07 DIAGNOSIS — H35033 Hypertensive retinopathy, bilateral: Secondary | ICD-10-CM | POA: Diagnosis not present

## 2019-06-07 DIAGNOSIS — H34832 Tributary (branch) retinal vein occlusion, left eye, with macular edema: Secondary | ICD-10-CM

## 2019-06-07 DIAGNOSIS — H43813 Vitreous degeneration, bilateral: Secondary | ICD-10-CM

## 2019-06-07 MED ORDER — GLUCOSE BLOOD VI STRP: ORAL_STRIP | 3 refills | Status: DC

## 2019-06-07 MED ORDER — ONETOUCH ULTRASOFT LANCETS MISC: 12 refills | Status: DC

## 2019-06-18 DIAGNOSIS — N183 Chronic kidney disease, stage 3 unspecified: Secondary | ICD-10-CM | POA: Diagnosis not present

## 2019-06-18 DIAGNOSIS — N2581 Secondary hyperparathyroidism of renal origin: Secondary | ICD-10-CM | POA: Diagnosis not present

## 2019-06-18 DIAGNOSIS — I129 Hypertensive chronic kidney disease with stage 1 through stage 4 chronic kidney disease, or unspecified chronic kidney disease: Secondary | ICD-10-CM | POA: Diagnosis not present

## 2019-06-18 DIAGNOSIS — E1122 Type 2 diabetes mellitus with diabetic chronic kidney disease: Secondary | ICD-10-CM | POA: Diagnosis not present

## 2019-06-24 ENCOUNTER — Other Ambulatory Visit: Payer: Self-pay | Admitting: Internal Medicine

## 2019-06-27 NOTE — Progress Notes (Signed)
@Patient  ID: Ian [name], male    DOB: June 10, 1936, 83 y.o.   MRN: [id number]  Chief Complaint  Patient presents with  . Follow-up    6 month f/u for COPD. States his breathing has been ok since last visit.     Referring provider: Gayland Curry, DO  HPI:  83 year old male former smoker followed in our office for COPD with an asthmatic component  PMH: Chronic kidney disease, anxiety, hypertension, hyperlipidemia, type 2 diabetes, depression, insomnia, Smoker/ Smoking History: Former smoker.  Quit 1990.  45-pack-year smoking history. Maintenance: Symbicort 160 Pt of: Dr. Halford Chessman  06/28/2019  - Visit   83 year old male former smoker for COPD.  Patient reporting today for 37-month follow-up visit.  He reports that he is doing well.  He has no acute issues with his breathing.  Sometimes he has some increased nasal congestion.  He continues to exercise daily with walking 2-3 times daily for 30 minutes at least.  He also works out at [employer] 3 times a week.  He is maintained well on Symbicort 160.  He is up-to-date on his flu vaccine.  Questionaires / Pulmonary Flowsheets:   MMRC: mMRC Dyspnea Scale mMRC Score  06/28/2019 0    Tests:   05/26/2019-CBC with differential-eosinophils absolute 258  04/07/2018-chest x-ray-no evidence of acute cardiopulmonary disease  01/01/2018-pulmonary function test-FVC 2.13 (60% predicted), ratio 63, FEV1 1.34 (54% predicted), DLCO 17.14 (60% predicted)  PFT: PFT Results Latest Ref Rng & Units 01/01/2018  FVC-Pre L 2.13  FVC-Predicted Pre % 60  Pre FEV1/FVC % % 63  FEV1-Pre L 1.34  FEV1-Predicted Pre % 54  DLCO UNC% % 60  DLCO COR %Predicted % 92   Lab Results:  CBC    Component Value Date/Time   WBC 7.6 05/26/2019 0826   RBC 3.89 (L) 05/26/2019 0826   HGB 12.2 (L) 05/26/2019 0826   HGB 12.6 12/11/2015 0821   HCT 37.8 (L) 05/26/2019 0826   HCT 37.8 12/11/2015 0821   PLT 163 05/26/2019 0826   PLT 153 12/11/2015 0821   MCV 97.2 05/26/2019  0826   MCV 96 12/11/2015 0821   MCH 31.4 05/26/2019 0826   MCHC 32.3 05/26/2019 0826   RDW 13.9 05/26/2019 0826   RDW 15.7 (H) 12/11/2015 0821   LYMPHSABS 1,923 05/26/2019 0826   LYMPHSABS 2.7 12/11/2015 0821   MONOABS 900 01/10/2017 1007   EOSABS 258 05/26/2019 0826   EOSABS 0.3 12/11/2015 0821   BASOSABS 61 05/26/2019 0826   BASOSABS 0.1 12/11/2015 0821    BMET    Component Value Date/Time   NA 140 05/26/2019 0826   NA 140 04/21/2017 0000   K 4.5 05/26/2019 0826   CL 106 05/26/2019 0826   CO2 28 05/26/2019 0826   GLUCOSE 144 (H) 05/26/2019 0826   BUN 43 (H) 05/26/2019 0826   BUN 35 (A) 04/21/2017 0000   CREATININE 2.06 (H) 05/26/2019 0826   CALCIUM 9.6 05/26/2019 0826   GFRNONAA 29 (L) 05/26/2019 0826   GFRAA 34 (L) 05/26/2019 0826    BNP    Component Value Date/Time   BNP 436.2 (H) 04/07/2018 0242    ProBNP    Component Value Date/Time   PROBNP 788.4 (H) 01/01/2013 0900    Specialty Problems      Pulmonary Problems   COPD with asthma (HCC)   Seasonal allergic rhinitis   Acute upper respiratory infection   Cough   COPD exacerbation (HCC)   Nasal congestion  Allergies  Allergen Reactions  . Ace Inhibitors Cough    Immunization History  Administered Date(s) Administered  . Fluad Quad(high Dose 65+) 05/27/2019  . Influenza Split 04/08/2011  . Influenza Whole 04/07/2012  . Influenza, High Dose Seasonal PF 04/07/2017, 05/25/2018  . Influenza,inj,Quad PF,6+ Mos 04/23/2013, 03/28/2014, 02/23/2016  . Influenza-Unspecified 04/08/2015, 07/09/2015, 04/08/2017  . Pneumococcal Conjugate-13 06/06/2014  . Pneumococcal-Unspecified 07/09/2007  . Td 08/09/1997  . Tdap 08/12/2011    Past Medical History:  Diagnosis Date  . Allergy   . Alzheimer's disease ([place])   . Anemia, unspecified   . Anxiety   . Chronic maxillary sinusitis   . COPD (chronic obstructive pulmonary disease) ([place])   . Depression   . Diabetes mellitus   . Edema   . Hypercalcemia    . Hyperlipidemia   . Hypertension   . Hypertrophy of prostate without urinary obstruction and other lower urinary tract symptoms (LUTS)   . Hypertrophy of prostate without urinary obstruction and other lower urinary tract symptoms (LUTS)   . Kidney disease    mild  . Unspecified disorder of kidney and ureter   . Unspecified sleep apnea   . Ventral hernia, unspecified, without mention of obstruction or gangrene     Tobacco History: Social History   Tobacco Use  Smoking Status Former Smoker  . Packs/day: 1.50  . Years: 30.00  . Pack years: 45.00  . Types: Cigarettes  . Quit date: 07/08/1988  . Years since quitting: 30.9  Smokeless Tobacco Never Used   Counseling given: Yes  Continue to not smoke  Outpatient Encounter Medications as of 06/28/2019  Medication Sig  . acetaminophen (TYLENOL) 500 MG tablet Take 1,000 mg by mouth every 8 (eight) hours as needed.  Marland Kitchen albuterol (VENTOLIN HFA) 108 (90 Base) MCG/ACT inhaler Inhale 2 puffs into the lungs every 6 (six) hours as needed for wheezing or shortness of breath.  Marland Kitchen amLODipine (NORVASC) 10 MG tablet TAKE 1 TABLET BY MOUTH DAILY  . aspirin (ASPIRIN LOW DOSE) 81 MG EC tablet Take 1 tablet (81 mg total) by mouth daily. DX: I50.32  . atorvastatin (LIPITOR) 40 MG tablet TAKE 1 TABLET BY MOUTH EVERY DAY  . BESIVANCE 0.6 % SUSP INSTILL ONE DROP INTO RIGHT EYE 4 TIMES A DAY FOR 2 DAYS AFTER EACH MONTHLY EYE INJECTION  . budesonide-formoterol (SYMBICORT) 160-4.5 MCG/ACT inhaler TAKE TWO PUFFS BY MOUTH TWICE DAILY  . diclofenac sodium (VOLTAREN) 1 % GEL Apply 2 g topically 4 (four) times daily as needed (shoulder pain).  . flurbiprofen (ANSAID) 100 MG tablet Take 100 mg by mouth 3 (three) times daily.  . fluticasone (FLONASE) 50 MCG/ACT nasal spray Place 2 sprays into both nostrils daily.  Marland Kitchen glucose blood (ONE TOUCH ULTRA TEST) test strip Use to test blood sugar twice daily.  DX: E11.9  . hydrALAZINE (APRESOLINE) 25 MG tablet TAKE 1 TABLET  BY MOUTH TWICE A DAY  . ipratropium-albuterol (DUONEB) 0.5-2.5 (3) MG/3ML SOLN Take 3 mLs by nebulization every 6 (six) hours as needed (wheezing).  Marland Kitchen JANUVIA 25 MG tablet TAKE 1 TABLET BY MOUTH EVERY DAY  . Lancet Devices (ONE TOUCH DELICA LANCING DEV) MISC Use to test blood sugar once daily dx: E119  . Lancets (ONETOUCH ULTRASOFT) lancets Use to test blood sugar up to three times daily. Dx E11.9  . losartan (COZAAR) 25 MG tablet TAKE 1 TABLET BY MOUTH EVERY DAY  . metoprolol tartrate (LOPRESSOR) 50 MG tablet TAKE 1 TABLET BY MOUTH TWICE A DAY  .  montelukast (SINGULAIR) 10 MG tablet TAKE 1 TABLET BY MOUTH AT BEDTIME  . PARoxetine (PAXIL) 30 MG tablet Take 1 tablet (30 mg total) by mouth daily.  . traZODone (DESYREL) 50 MG tablet TAKE 1 TABLET BY MOUTH AT BEDTIME  . trimethoprim-polymyxin b (POLYTRIM) ophthalmic solution INSTILL ONE DROP INTO RIGHT EYE 4 TIMES A DAY FOR 2 DAYS AFTER EACH MONTHLY EYE INJECTION  . [DISCONTINUED] polyethylene glycol powder (GLYCOLAX/MIRALAX) powder Take 17 g by mouth every 3 (three) days. For constipation; hold for loose stools   No facility-administered encounter medications on file as of 06/28/2019.     Review of Systems  Review of Systems  Constitutional: Negative for activity change, chills, fatigue, fever and unexpected weight change.  HENT: Positive for congestion and postnasal drip. Negative for rhinorrhea, sinus pressure, sinus pain and sore throat.   Eyes: Negative.   Respiratory: Negative for cough, shortness of breath and wheezing.   Cardiovascular: Negative for chest pain and palpitations.  Gastrointestinal: Negative for constipation, diarrhea, nausea and vomiting.  Endocrine: Negative.   Genitourinary: Negative.   Musculoskeletal: Negative.   Skin: Negative.   Neurological: Negative for dizziness and headaches.  Psychiatric/Behavioral: Negative.  Negative for dysphoric mood. The patient is not nervous/anxious.   All other systems reviewed  and are negative.    Physical Exam  BP 116/68 (BP Location: Left Arm, Patient Position: Sitting, Cuff Size: Normal)   Pulse 65   Temp (!) 97.3 F (36.3 C) (Temporal)   Ht 5\' 7"  (1.702 m)   Wt 193 lb 9.6 oz (87.8 kg)   SpO2 94% Comment: on RA  BMI 30.32 kg/m   Wt Readings from Last 5 Encounters:  06/28/19 193 lb 9.6 oz (87.8 kg)  05/27/19 198 lb 12.8 oz (90.2 kg)  04/15/19 198 lb 3.2 oz (89.9 kg)  01/25/19 208 lb (94.3 kg)  12/23/18 206 lb 7.2 oz (93.6 kg)    BMI Readings from Last 5 Encounters:  06/28/19 30.32 kg/m  05/27/19 31.14 kg/m  04/15/19 31.04 kg/m  01/25/19 32.58 kg/m  12/23/18 32.33 kg/m     Physical Exam Vitals and nursing note reviewed.  Constitutional:      General: He is not in acute distress.    Appearance: Normal appearance. He is obese.  HENT:     Head: Normocephalic and atraumatic.     Right Ear: Hearing, tympanic membrane, ear canal and external ear normal.     Left Ear: Hearing, tympanic membrane, ear canal and external ear normal.     Nose: Rhinorrhea present. No mucosal edema.     Right Turbinates: Not enlarged.     Left Turbinates: Not enlarged.     Mouth/Throat:     Mouth: Mucous membranes are dry.     Pharynx: Oropharynx is clear. No oropharyngeal exudate.  Eyes:     Pupils: Pupils are equal, round, and reactive to light.  Cardiovascular:     Rate and Rhythm: Normal rate and regular rhythm.     Pulses: Normal pulses.     Heart sounds: Normal heart sounds. No murmur.  Pulmonary:     Effort: Pulmonary effort is normal.     Breath sounds: Normal breath sounds. No decreased breath sounds, wheezing or rales.  Musculoskeletal:     Cervical back: Normal range of motion.     Right lower leg: No edema.     Left lower leg: No edema.  Lymphadenopathy:     Cervical: No cervical adenopathy.  Skin:    General:  Skin is warm and dry.     Capillary Refill: Capillary refill takes less than 2 seconds.     Findings: No erythema or rash.   Neurological:     General: No focal deficit present.     Mental Status: He is alert and oriented to person, place, and time.     Motor: No weakness.     Coordination: Coordination normal.     Gait: Gait is intact. Gait normal.  Psychiatric:        Mood and Affect: Mood normal.        Behavior: Behavior normal. Behavior is cooperative.        Thought Content: Thought content normal.        Judgment: Judgment normal.       Assessment & Plan:   COPD with asthma ([place]) Plan: Continue Symbicort 160 Continue to remain physically active Follow-up with our office in 6 months  Seasonal allergic rhinitis Plan: Continue Singulair Start nasal saline rinses 1-2 times daily Can use Flonase 1 spray each nostril as needed daily for nasal congestion    Return in about 6 months (around 12/27/2019), or if symptoms worsen or fail to improve, for Follow up with Dr. Halford Chessman.   Lauraine Rinne, NP 06/28/2019   This appointment was 20 minutes long with over 50% of the time in direct face-to-face patient care, assessment, plan of care, and follow-up.

## 2019-06-28 ENCOUNTER — Encounter: Payer: Self-pay | Admitting: Pulmonary Disease

## 2019-06-28 ENCOUNTER — Ambulatory Visit: Payer: PPO | Admitting: Pulmonary Disease

## 2019-06-28 ENCOUNTER — Other Ambulatory Visit: Payer: Self-pay

## 2019-06-28 ENCOUNTER — Ambulatory Visit: Payer: PPO | Admitting: Nurse Practitioner

## 2019-06-28 VITALS — BP 116/68 | HR 65 | Temp 97.3°F | Ht 67.0 in | Wt 193.6 lb

## 2019-06-28 DIAGNOSIS — J302 Other seasonal allergic rhinitis: Secondary | ICD-10-CM

## 2019-06-28 DIAGNOSIS — J449 Chronic obstructive pulmonary disease, unspecified: Secondary | ICD-10-CM

## 2019-06-28 MED ORDER — BUDESONIDE-FORMOTEROL FUMARATE 160-4.5 MCG/ACT IN AERO: INHALATION_SPRAY | RESPIRATORY_TRACT | 5 refills | Status: DC

## 2019-06-28 NOTE — Addendum Note (Signed)
Addended by: Valerie Salts on: 06/28/2019 10:35 AM   Modules accepted: Orders

## 2019-06-28 NOTE — Assessment & Plan Note (Signed)
Plan: Continue Singulair Start nasal saline rinses 1-2 times daily Can use Flonase 1 spray each nostril as needed daily for nasal congestion

## 2019-06-28 NOTE — Patient Instructions (Addendum)
You were seen today by Lauraine Rinne, NP  for:   1. COPD with asthma ([place])  Continue Symbicort 160 >>> 2 puffs in the morning right when you wake up, rinse out your mouth after use, 12 hours later 2 puffs, rinse after use >>> Take this daily, no matter what >>> This is not a rescue inhaler   Only use your albuterol as a rescue medication to be used if you can't catch your breath by resting or doing a relaxed purse lip breathing pattern.  - The less you use it, the better it will work when you need it. - Ok to use up to 2 puffs  every 4 hours if you must but call for immediate appointment if use goes up over your usual need - Don't leave home without it !!  (think of it like the spare tire for your car)   Note your daily symptoms > remember "red flags" for COPD:   >>>Increase in cough >>>increase in sputum production >>>increase in shortness of breath or activity  intolerance.   If you notice these symptoms, please call the office to be seen.    Continue to exercise daily  2. Seasonal allergic rhinitis, unspecified trigger  Continue Singulair daily Start nasal saline rinses twice daily Can use Flonase 1 spray each nostril daily as needed for nasal congestion   Follow Up:    Return in about 6 months (around 12/27/2019), or if symptoms worsen or fail to improve, for Follow up with Dr. Halford Chessman.   Please do your part to reduce the spread of COVID-19:      Reduce your risk of any infection  and COVID19 by using the similar precautions used for avoiding the common cold or flu:  Marland Kitchen Wash your hands often with soap and warm water for at least 20 seconds.  If soap and water are not readily available, use an alcohol-based hand sanitizer with at least 60% alcohol.  . If coughing or sneezing, cover your mouth and nose by coughing or sneezing into the elbow areas of your shirt or coat, into a tissue or into your sleeve (not your hands). Langley Gauss A MASK when in public  . Avoid shaking hands  with others and consider head nods or verbal greetings only. . Avoid touching your eyes, nose, or mouth with unwashed hands.  . Avoid close contact with people who are sick. . Avoid places or events with large numbers of people in one location, like concerts or sporting events. . If you have some symptoms but not all symptoms, continue to monitor at home and seek medical attention if your symptoms worsen. . If you are having a medical emergency, call 911.   [place] / e-Visit: [email or web address]         [facility]: [facility]: [id number]                   [facility]: [id number]     It is flu season:   >>> Best ways to protect herself from the flu: Receive the yearly flu vaccine, practice good hand hygiene washing with soap and also using hand sanitizer when available, eat a nutritious meals, get adequate rest, hydrate appropriately   Please contact the office if your symptoms worsen or you have concerns that you are not improving.   Thank you for choosing [facility] for your healthcare, and for  allowing [place] to partner with you on your healthcare journey. I am thankful to be able to provide care to you today.   Wyn Quaker FNP-C    COPD and Physical Activity Chronic obstructive pulmonary disease (COPD) is a long-term (chronic) condition that affects the lungs. COPD is a general term that can be used to describe many different lung problems that cause lung swelling (inflammation) and limit airflow, including chronic bronchitis and emphysema. The main symptom of COPD is shortness of breath, which makes it harder to do even simple tasks. This can also make it harder to exercise and be active. Talk with your health care provider about treatments to help you breathe better and actions you can take to prevent breathing  problems during physical activity. What are the benefits of exercising with COPD? Exercising regularly is an important part of a healthy lifestyle. You can still exercise and do physical activities even though you have COPD. Exercise and physical activity improve your shortness of breath by increasing blood flow (circulation). This causes your heart to pump more oxygen through your body. Moderate exercise can improve your:  Oxygen use.  Energy level.  Shortness of breath.  Strength in your breathing muscles.  Heart health.  Sleep.  Self-esteem and feelings of self-worth.  Depression, stress, and anxiety levels. Exercise can benefit everyone with COPD. The severity of your disease may affect how hard you can exercise, especially at first, but everyone can benefit. Talk with your health care provider about how much exercise is safe for you, and which activities and exercises are safe for you. What actions can I take to prevent breathing problems during physical activity?  Sign up for a pulmonary rehabilitation program. This type of program may include: ? Education about lung diseases. ? Exercise classes that teach you how to exercise and be more active while improving your breathing. This usually involves:  Exercise using your lower extremities, such as a stationary bicycle.  About 30 minutes of exercise, 2 to 5 times per week, for 6 to 12 weeks  Strength training, such as push ups or leg lifts. ? Nutrition education. ? Group classes in which you can talk with others who also have COPD and learn ways to manage stress.  If you use an oxygen tank, you should use it while you exercise. Work with your health care provider to adjust your oxygen for your physical activity. Your resting flow rate is different from your flow rate during physical activity.  While you are exercising: ? Take slow breaths. ? Pace yourself and do not try to go too fast. ? Purse your lips while breathing out.  Pursing your lips is similar to a kissing or whistling position. ? If doing exercise that uses a quick burst of effort, such as weight lifting:  Breathe in before starting the exercise.  Breathe out during the hardest part of the exercise (such as raising the weights). Where to find support You can find support for exercising with COPD from:  Your health care provider.  A pulmonary rehabilitation program.  Your local health department or community health programs.  Support groups, online or in-person. Your health care provider may be able to recommend support groups. Where to find more information You can find more information about exercising with COPD from:  American Lung Association: ClassInsider.se.  COPD Foundation: https://www.rivera.net/. Contact a health care provider if:  Your symptoms get worse.  You have chest pain.  You have nausea.  You have a fever.  You have trouble talking or catching your breath.  You want to start a new exercise program or a new activity. Summary  COPD is a general term that can be used to describe many different lung problems that cause lung swelling (inflammation) and limit airflow. This includes chronic bronchitis and emphysema.  Exercise and physical activity improve your shortness of breath by increasing blood flow (circulation). This causes your heart to provide more oxygen to your body.  Contact your health care provider before starting any exercise program or new activity. Ask your health care provider what exercises and activities are safe for you. This information is not intended to replace advice given to you by your health care provider. Make sure you discuss any questions you have with your health care provider. Document Released: 07/17/2017 Document Revised: 10/14/2018 Document Reviewed: 07/17/2017 Elsevier Patient Education  2020 [employer].

## 2019-06-28 NOTE — Progress Notes (Signed)
Reviewed and agree with assessment/plan.   Zanovia Rotz, MD [facility] 07/03/2016, 12:24 PM Pager:  [phone]  

## 2019-06-28 NOTE — Assessment & Plan Note (Signed)
Plan: Continue Symbicort 160 Continue to remain physically active Follow-up with our office in 6 months

## 2019-07-12 ENCOUNTER — Other Ambulatory Visit: Payer: Self-pay

## 2019-07-12 ENCOUNTER — Encounter (INDEPENDENT_AMBULATORY_CARE_PROVIDER_SITE_OTHER): Payer: PPO | Admitting: Ophthalmology

## 2019-07-12 ENCOUNTER — Encounter: Payer: PPO | Admitting: Family

## 2019-07-23 ENCOUNTER — Telehealth: Payer: Self-pay

## 2019-07-23 NOTE — Telephone Encounter (Signed)
The patient's son, [name], called.  His father had gone to [facility] to get the "COVID shot", but got a high-dose flu shot, which he had already gotten in November.  He stated his father was having some chills and feeling "Icky."  Between the time he called and the time I returned his call he said his father was feeling better. I provided the number for son to call to try to schedule a COVID vaccine and told him to call back if there were any further concerns.

## 2019-07-23 NOTE — Progress Notes (Signed)
This encounter was created in error - please disregard.

## 2019-07-26 ENCOUNTER — Other Ambulatory Visit: Payer: Self-pay

## 2019-07-26 ENCOUNTER — Encounter: Payer: Self-pay | Admitting: Family

## 2019-07-26 ENCOUNTER — Ambulatory Visit (INDEPENDENT_AMBULATORY_CARE_PROVIDER_SITE_OTHER): Payer: PPO | Admitting: Family

## 2019-07-26 VITALS — BP 124/68 | HR 54 | Temp 97.5°F | Ht 67.0 in | Wt 191.0 lb

## 2019-07-26 DIAGNOSIS — Z0000 Encounter for general adult medical examination without abnormal findings: Secondary | ICD-10-CM | POA: Diagnosis not present

## 2019-07-26 NOTE — Progress Notes (Signed)
Subjective:   Ian [name] is a 84 y.o. male who presents for Medicare Annual/Subsequent preventive examination.  Review of Systems:  Cardiac Risk Factors include: advanced age (>29men, >79 women);diabetes mellitus;hypertension;dyslipidemia;male gender     Objective:    Vitals: BP 124/68   Pulse (!) 54 Comment: With pulse oximeter and manual. Right stronger.  Temp (!) 97.5 F (36.4 C) (Oral)   Ht 5\' 7"  (1.702 m)   Wt 191 lb (86.6 kg)   SpO2 94%   BMI 29.91 kg/m   Body mass index is 29.91 kg/m.  Advanced Directives 07/26/2019 07/26/2019 05/27/2019 07/06/2018 04/16/2018 04/07/2018 04/07/2018  Does Patient Have a Medical Advance Directive? Yes Yes Yes Yes Yes Yes Yes  Type of Advance Directive Living will Living will - [place];Living will Living will [place];Living will [place];Living will  Does patient want to make changes to medical advance directive? No - Patient declined No - Patient declined No - Patient declined No - Patient declined - No - Patient declined -  Copy of [place] in Chart? - - - Yes - validated most recent copy scanned in chart (See row information) - - Yes  Pre-existing out of facility DNR order (yellow form or pink MOST form) - - - - - - -    Tobacco Social History   Tobacco Use  Smoking Status Former Smoker  . Packs/day: 1.50  . Years: 30.00  . Pack years: 45.00  . Types: Cigarettes  . Quit date: 07/08/1988  . Years since quitting: 31.0  Smokeless Tobacco Never Used     Counseling given: Not Answered   Clinical Intake:  Pre-visit preparation completed: No  Pain : No/denies pain     BMI - recorded: 29.91 Nutritional Status: BMI 25 -29 Overweight Nutritional Risks: None Diabetes: Yes CBG done?: No Did pt. bring in CBG monitor from home?: No(recall CBG 150's)  How often do you need to have someone help you when you read instructions, pamphlets, or other written  materials from your doctor or pharmacy?: 3 - Sometimes What is the last grade level you completed in school?: One Year of College  Interpreter Needed?: No  Information entered by :: Ryn Peine FNP-C  Past Medical History:  Diagnosis Date  . Allergy   . Alzheimer's disease ([place])   . Anemia, unspecified   . Anxiety   . Chronic maxillary sinusitis   . COPD (chronic obstructive pulmonary disease) ([place])   . Depression   . Diabetes mellitus   . Edema   . Hypercalcemia   . Hyperlipidemia   . Hypertension   . Hypertrophy of prostate without urinary obstruction and other lower urinary tract symptoms (LUTS)   . Hypertrophy of prostate without urinary obstruction and other lower urinary tract symptoms (LUTS)   . Kidney disease    mild  . Unspecified disorder of kidney and ureter   . Unspecified sleep apnea   . Ventral hernia, unspecified, without mention of obstruction or gangrene    Past Surgical History:  Procedure Laterality Date  . EXPLORATORY LAPAROTOMY  2002  . HERNIA REPAIR     Family History  Problem Relation Age of Onset  . Pancreatic cancer Sister   . Heart disease Father   . Hypertension Brother   . Hypertension Sister    Social History   Socioeconomic History  . Marital status: Married    Spouse name: Not on file  . Number of children: 2  .  Years of education: Not on file  . Highest education level: Not on file  Occupational History  . Occupation: retired    Comment: [employer]  Tobacco Use  . Smoking status: Former Smoker    Packs/day: 1.50    Years: 30.00    Pack years: 45.00    Types: Cigarettes    Quit date: 07/08/1988    Years since quitting: 31.0  . Smokeless tobacco: Never Used  Substance and Sexual Activity  . Alcohol use: No  . Drug use: No  . Sexual activity: Not Currently  Other Topics Concern  . Not on file  Social History Narrative  . Not on file   Social Determinants of Health   Financial Resource Strain:   . Difficulty of  Paying Living Expenses: Not on file  Food Insecurity:   . Worried About Charity fundraiser in the Last Year: Not on file  . Ran Out of Food in the Last Year: Not on file  Transportation Needs:   . Lack of Transportation (Medical): Not on file  . Lack of Transportation (Non-Medical): Not on file  Physical Activity:   . Days of Exercise per Week: Not on file  . Minutes of Exercise per Session: Not on file  Stress:   . Feeling of Stress : Not on file  Social Connections:   . Frequency of Communication with Friends and Family: Not on file  . Frequency of Social Gatherings with Friends and Family: Not on file  . Attends Religious Services: Not on file  . Active Member of Clubs or Organizations: Not on file  . Attends Archivist Meetings: Not on file  . Marital Status: Not on file    Outpatient Encounter Medications as of 07/26/2019  Medication Sig  . acetaminophen (TYLENOL) 500 MG tablet Take 1,000 mg by mouth every 8 (eight) hours as needed.  Marland Kitchen albuterol (VENTOLIN HFA) 108 (90 Base) MCG/ACT inhaler Inhale 2 puffs into the lungs every 6 (six) hours as needed for wheezing or shortness of breath.  Marland Kitchen amLODipine (NORVASC) 10 MG tablet TAKE 1 TABLET BY MOUTH DAILY  . aspirin (ASPIRIN LOW DOSE) 81 MG EC tablet Take 1 tablet (81 mg total) by mouth daily. DX: I50.32  . atorvastatin (LIPITOR) 40 MG tablet TAKE 1 TABLET BY MOUTH EVERY DAY  . BESIVANCE 0.6 % SUSP INSTILL ONE DROP INTO RIGHT EYE 4 TIMES A DAY FOR 2 DAYS AFTER EACH MONTHLY EYE INJECTION  . budesonide-formoterol (SYMBICORT) 160-4.5 MCG/ACT inhaler TAKE TWO PUFFS BY MOUTH TWICE DAILY  . diclofenac sodium (VOLTAREN) 1 % GEL Apply 2 g topically 4 (four) times daily as needed (shoulder pain).  . flurbiprofen (ANSAID) 100 MG tablet Take 100 mg by mouth 3 (three) times daily.  . fluticasone (FLONASE) 50 MCG/ACT nasal spray Place 2 sprays into both nostrils daily.  Marland Kitchen glucose blood (ONE TOUCH ULTRA TEST) test strip Use to test blood  sugar twice daily.  DX: E11.9  . hydrALAZINE (APRESOLINE) 25 MG tablet TAKE 1 TABLET BY MOUTH TWICE A DAY  . ipratropium-albuterol (DUONEB) 0.5-2.5 (3) MG/3ML SOLN Take 3 mLs by nebulization every 6 (six) hours as needed (wheezing).  Marland Kitchen JANUVIA 25 MG tablet TAKE 1 TABLET BY MOUTH EVERY DAY  . Lancet Devices (ONE TOUCH DELICA LANCING DEV) MISC Use to test blood sugar once daily dx: E119  . Lancets (ONETOUCH ULTRASOFT) lancets Use to test blood sugar up to three times daily. Dx E11.9  . losartan (COZAAR) 25 MG  tablet TAKE 1 TABLET BY MOUTH EVERY DAY  . metoprolol tartrate (LOPRESSOR) 50 MG tablet TAKE 1 TABLET BY MOUTH TWICE A DAY  . montelukast (SINGULAIR) 10 MG tablet TAKE 1 TABLET BY MOUTH AT BEDTIME  . PARoxetine (PAXIL) 30 MG tablet Take 1 tablet (30 mg total) by mouth daily.  . traZODone (DESYREL) 50 MG tablet TAKE 1 TABLET BY MOUTH AT BEDTIME  . trimethoprim-polymyxin b (POLYTRIM) ophthalmic solution INSTILL ONE DROP INTO RIGHT EYE 4 TIMES A DAY FOR 2 DAYS AFTER EACH MONTHLY EYE INJECTION   No facility-administered encounter medications on file as of 07/26/2019.    Activities of Daily Living In your present state of health, do you have any difficulty performing the following activities: 07/26/2019  Hearing? Y  Comment wears hearing aids  Vision? N  Difficulty concentrating or making decisions? Y  Comment once in a while  Walking or climbing stairs? N  Dressing or bathing? N  Doing errands, shopping? N  Preparing Food and eating ? N  Using the Toilet? N  In the past six months, have you accidently leaked urine? N  Do you have problems with loss of bowel control? N  Managing your Medications? Y  Comment son assist  Managing your Finances? Y  Comment son Renard Matter? Y  Comment has a cleaner once a month as needed  Some recent data might be hidden    Patient Care Team: Gayland Curry, DO as PCP - General (Geriatric Medicine) Zebedee Iba., MD as Referring Physician (Ophthalmology) Chesley Mires, MD as Consulting Physician (Pulmonary Disease)   Assessment:   This is a routine wellness examination for [place].  Exercise Activities and Dietary recommendations Current Exercise Habits: Home exercise routine, Type of exercise: walking, Time (Minutes): 60, Frequency (Times/Week): 6, Weekly Exercise (Minutes/Week): 360, Intensity: Moderate, Exercise limited by: None identified  Goals    . increase water     Patient will drink 6 8 oz glasses a day.       Fall Risk Fall Risk  05/27/2019 01/25/2019 12/18/2018 07/16/2018 07/06/2018  Falls in the past year? 0 0 0 0 0  Number falls in past yr: - 0 0 0 -  Injury with Fall? 0 0 0 0 -   Is the patient's home free of loose throw rugs in walkways, pet beds, electrical cords, etc?   no      Grab bars in the bathroom? yes      Handrails on the stairs?   no has one step       Adequate lighting?   yes  Depression Screen PHQ 2/9 Scores 05/27/2019 01/25/2019 07/16/2018 07/06/2018  PHQ - 2 Score 0 0 0 0    Cognitive Function MMSE - Mini Mental State Exam 07/26/2019 07/06/2018 07/02/2017 06/03/2016 05/12/2015  Orientation to time 5 5 5 4 4   Orientation to Place 5 5 5 5 5   Registration 3 3 3 3 3   Attention/ Calculation 5 5 5 5 5   Recall 3 2 1 1 2   Language- name 2 objects 2 2 2 2 2   Language- repeat 1 1 1 1 1   Language- follow 3 step command 3 3 3 3 3   Language- read & follow direction 1 1 1 1 1   Write a sentence 1 1 1 1 1   Copy design 0 0 0 0 1  Total score 29 28 27 26 28         Immunization History  Administered Date(s) Administered  . Fluad Quad(high Dose 65+) 05/27/2019  . Influenza Split 04/08/2011  . Influenza Whole 04/07/2012  . Influenza, High Dose Seasonal PF 04/07/2017, 05/25/2018, 07/22/2019  . Influenza,inj,Quad PF,6+ Mos 04/23/2013, 03/28/2014, 02/23/2016  . Influenza-Unspecified 04/08/2015, 07/09/2015, 04/08/2017  . Pneumococcal Conjugate-13 06/06/2014  .  Pneumococcal-Unspecified 07/09/2007  . Td 08/09/1997  . Tdap 08/12/2011    Qualifies for Shingles Vaccine? Has has shingles in the past   Screening Tests Health Maintenance  Topic Date Due  . HEMOGLOBIN A1C  11/23/2019  . OPHTHALMOLOGY EXAM  11/25/2019  . FOOT EXAM  05/26/2020  . TETANUS/TDAP  08/11/2021  . INFLUENZA VACCINE  Completed  . PNA vac Low Risk Adult  Completed   Cancer Screenings: Lung: Low Dose CT Chest recommended if Age 66-80 years, 30 pack-year currently smoking OR have quit w/in 15years. Patient does not qualify. Colorectal:Age Out   Additional Screenings: Hepatitis C Screening:low Risk    Plan:  Has upcoming Ophthalmology appointment 07/26/2019  I have personally reviewed and noted the following in the patient's chart:   . Medical and social history . Use of alcohol, tobacco or illicit drugs  . Current medications and supplements . Functional ability and status . Nutritional status . Physical activity . Advanced directives . List of other physicians . Hospitalizations, surgeries, and ER visits in previous 12 months . Vitals . Screenings to include cognitive, depression, and falls . Referrals and appointments  In addition, I have reviewed and discussed with patient certain preventive protocols, quality metrics, and best practice recommendations. A written personalized care plan for preventive services as well as general preventive health recommendations were provided to patient.    Sandrea Hughs, NP  07/26/2019

## 2019-07-26 NOTE — Patient Instructions (Signed)
[name] , Thank you for taking time to come for your Medicare Wellness Visit. I appreciate your ongoing commitment to your health goals. Please review the following plan we discussed and let me know if I can assist you in the future.   Screening recommendations/referrals: Colonoscopy : Age out  Recommended yearly ophthalmology/optometry visit for glaucoma screening and checkup Recommended yearly dental visit for hygiene and checkup  Vaccinations: Influenza vaccine: Up to date  Pneumococcal vaccine : Up to date  Tdap vaccine : Up to date due 08/11/2021  Shingles vaccine: N/a    Advanced directives: Yes   Conditions/risks identified: Advance Age Male > 1 yrs,Type 2DM,Hypertension,Dyslipidemia,Male Gender   Next appointment: 1 year   Preventive Care 18 Years and Older, Male Preventive care refers to lifestyle choices and visits with your health care provider that can promote health and wellness. What does preventive care include?  A yearly physical exam. This is also called an annual well check.  Dental exams once or twice a year.  Routine eye exams. Ask your health care provider how often you should have your eyes checked.  Personal lifestyle choices, including:  Daily care of your teeth and gums.  Regular physical activity.  Eating a healthy diet.  Avoiding tobacco and drug use.  Limiting alcohol use.  Practicing safe sex.  Taking low doses of aspirin every day.  Taking vitamin and mineral supplements as recommended by your health care provider. What happens during an annual well check? The services and screenings done by your health care provider during your annual well check will depend on your age, overall health, lifestyle risk factors, and family history of disease. Counseling  Your health care provider may ask you questions about your:  Alcohol use.  Tobacco use.  Drug use.  Emotional well-being.  Home and relationship well-being.  Sexual  activity.  Eating habits.  History of falls.  Memory and ability to understand (cognition).  Work and work Statistician. Screening  You may have the following tests or measurements:  Height, weight, and BMI.  Blood pressure.  Lipid and cholesterol levels. These may be checked every 5 years, or more frequently if you are over 76 years old.  Skin check.  Lung cancer screening. You may have this screening every year starting at age 35 if you have a 30-pack-year history of smoking and currently smoke or have quit within the past 15 years.  Fecal occult blood test (FOBT) of the stool. You may have this test every year starting at age 87.  Flexible sigmoidoscopy or colonoscopy. You may have a sigmoidoscopy every 5 years or a colonoscopy every 10 years starting at age 71.  Prostate cancer screening. Recommendations will vary depending on your family history and other risks.  Hepatitis C blood test.  Hepatitis B blood test.  Sexually transmitted disease (STD) testing.  Diabetes screening. This is done by checking your blood sugar (glucose) after you have not eaten for a while (fasting). You may have this done every 1-3 years.  Abdominal aortic aneurysm (AAA) screening. You may need this if you are a current or former smoker.  Osteoporosis. You may be screened starting at age 32 if you are at high risk. Talk with your health care provider about your test results, treatment options, and if necessary, the need for more tests. Vaccines  Your health care provider may recommend certain vaccines, such as:  Influenza vaccine. This is recommended every year.  Tetanus, diphtheria, and acellular pertussis (Tdap, Td) vaccine.  You may need a Td booster every 10 years.  Zoster vaccine. You may need this after age 93.  Pneumococcal 13-valent conjugate (PCV13) vaccine. One dose is recommended after age 70.  Pneumococcal polysaccharide (PPSV23) vaccine. One dose is recommended after age  32. Talk to your health care provider about which screenings and vaccines you need and how often you need them. This information is not intended to replace advice given to you by your health care provider. Make sure you discuss any questions you have with your health care provider. Document Released: 07/21/2015 Document Revised: 03/13/2016 Document Reviewed: 04/25/2015 Elsevier Interactive Patient Education  2017 [place] Prevention in the Home Falls can cause injuries. They can happen to people of all ages. There are many things you can do to make your home safe and to help prevent falls. What can I do on the outside of my home?  Regularly fix the edges of walkways and driveways and fix any cracks.  Remove anything that might make you trip as you walk through a door, such as a raised step or threshold.  Trim any bushes or trees on the path to your home.  Use bright outdoor lighting.  Clear any walking paths of anything that might make someone trip, such as rocks or tools.  Regularly check to see if handrails are loose or broken. Make sure that both sides of any steps have handrails.  Any raised decks and porches should have guardrails on the edges.  Have any leaves, snow, or ice cleared regularly.  Use sand or salt on walking paths during winter.  Clean up any spills in your garage right away. This includes oil or grease spills. What can I do in the bathroom?  Use night lights.  Install grab bars by the toilet and in the tub and shower. Do not use towel bars as grab bars.  Use non-skid mats or decals in the tub or shower.  If you need to sit down in the shower, use a plastic, non-slip stool.  Keep the floor dry. Clean up any water that spills on the floor as soon as it happens.  Remove soap buildup in the tub or shower regularly.  Attach bath mats securely with double-sided non-slip rug tape.  Do not have throw rugs and other things on the floor that can make  you trip. What can I do in the bedroom?  Use night lights.  Make sure that you have a light by your bed that is easy to reach.  Do not use any sheets or blankets that are too big for your bed. They should not hang down onto the floor.  Have a firm chair that has side arms. You can use this for support while you get dressed.  Do not have throw rugs and other things on the floor that can make you trip. What can I do in the kitchen?  Clean up any spills right away.  Avoid walking on wet floors.  Keep items that you use a lot in easy-to-reach places.  If you need to reach something above you, use a strong step stool that has a grab bar.  Keep electrical cords out of the way.  Do not use floor polish or wax that makes floors slippery. If you must use wax, use non-skid floor wax.  Do not have throw rugs and other things on the floor that can make you trip. What can I do with my stairs?  Do not leave any  items on the stairs.  Make sure that there are handrails on both sides of the stairs and use them. Fix handrails that are broken or loose. Make sure that handrails are as long as the stairways.  Check any carpeting to make sure that it is firmly attached to the stairs. Fix any carpet that is loose or worn.  Avoid having throw rugs at the top or bottom of the stairs. If you do have throw rugs, attach them to the floor with carpet tape.  Make sure that you have a light switch at the top of the stairs and the bottom of the stairs. If you do not have them, ask someone to add them for you. What else can I do to help prevent falls?  Wear shoes that:  Do not have high heels.  Have rubber bottoms.  Are comfortable and fit you well.  Are closed at the toe. Do not wear sandals.  If you use a stepladder:  Make sure that it is fully opened. Do not climb a closed stepladder.  Make sure that both sides of the stepladder are locked into place.  Ask someone to hold it for you, if  possible.  Clearly mark and make sure that you can see:  Any grab bars or handrails.  First and last steps.  Where the edge of each step is.  Use tools that help you move around (mobility aids) if they are needed. These include:  Canes.  Walkers.  Scooters.  Crutches.  Turn on the lights when you go into a dark area. Replace any light bulbs as soon as they burn out.  Set up your furniture so you have a clear path. Avoid moving your furniture around.  If any of your floors are uneven, fix them.  If there are any pets around you, be aware of where they are.  Review your medicines with your doctor. Some medicines can make you feel dizzy. This can increase your chance of falling. Ask your doctor what other things that you can do to help prevent falls. This information is not intended to replace advice given to you by your health care provider. Make sure you discuss any questions you have with your health care provider. Document Released: 04/20/2009 Document Revised: 11/30/2015 Document Reviewed: 07/29/2014 Elsevier Interactive Patient Education  2017 [employer].

## 2019-07-27 ENCOUNTER — Encounter (INDEPENDENT_AMBULATORY_CARE_PROVIDER_SITE_OTHER): Payer: PPO | Admitting: Ophthalmology

## 2019-07-27 ENCOUNTER — Other Ambulatory Visit: Payer: Self-pay

## 2019-07-27 DIAGNOSIS — H353211 Exudative age-related macular degeneration, right eye, with active choroidal neovascularization: Secondary | ICD-10-CM

## 2019-07-27 DIAGNOSIS — H35033 Hypertensive retinopathy, bilateral: Secondary | ICD-10-CM | POA: Diagnosis not present

## 2019-07-27 DIAGNOSIS — H43813 Vitreous degeneration, bilateral: Secondary | ICD-10-CM

## 2019-07-27 DIAGNOSIS — H348322 Tributary (branch) retinal vein occlusion, left eye, stable: Secondary | ICD-10-CM

## 2019-07-27 DIAGNOSIS — I10 Essential (primary) hypertension: Secondary | ICD-10-CM

## 2019-08-16 DIAGNOSIS — Z87891 Personal history of nicotine dependence: Secondary | ICD-10-CM | POA: Diagnosis not present

## 2019-08-16 DIAGNOSIS — I10 Essential (primary) hypertension: Secondary | ICD-10-CM | POA: Diagnosis not present

## 2019-08-16 DIAGNOSIS — E119 Type 2 diabetes mellitus without complications: Secondary | ICD-10-CM | POA: Diagnosis not present

## 2019-08-16 DIAGNOSIS — S0081XA Abrasion of other part of head, initial encounter: Secondary | ICD-10-CM | POA: Diagnosis not present

## 2019-08-16 DIAGNOSIS — E785 Hyperlipidemia, unspecified: Secondary | ICD-10-CM | POA: Diagnosis not present

## 2019-08-16 DIAGNOSIS — Z23 Encounter for immunization: Secondary | ICD-10-CM | POA: Diagnosis not present

## 2019-08-16 DIAGNOSIS — W010XXA Fall on same level from slipping, tripping and stumbling without subsequent striking against object, initial encounter: Secondary | ICD-10-CM | POA: Diagnosis not present

## 2019-08-16 DIAGNOSIS — S0121XA Laceration without foreign body of nose, initial encounter: Secondary | ICD-10-CM | POA: Diagnosis not present

## 2019-08-16 DIAGNOSIS — S0031XA Abrasion of nose, initial encounter: Secondary | ICD-10-CM | POA: Diagnosis not present

## 2019-08-16 DIAGNOSIS — S022XXA Fracture of nasal bones, initial encounter for closed fracture: Secondary | ICD-10-CM | POA: Diagnosis not present

## 2019-08-16 DIAGNOSIS — T1490XA Injury, unspecified, initial encounter: Secondary | ICD-10-CM | POA: Diagnosis not present

## 2019-08-20 ENCOUNTER — Telehealth: Payer: Self-pay | Admitting: *Deleted

## 2019-08-20 DIAGNOSIS — S022XXA Fracture of nasal bones, initial encounter for closed fracture: Secondary | ICD-10-CM | POA: Diagnosis not present

## 2019-08-20 DIAGNOSIS — R040 Epistaxis: Secondary | ICD-10-CM | POA: Diagnosis not present

## 2019-08-20 DIAGNOSIS — J342 Deviated nasal septum: Secondary | ICD-10-CM | POA: Diagnosis not present

## 2019-08-20 NOTE — Telephone Encounter (Signed)
[name], son called and stated that he just wanted to let you know that patient is out of town at [employer] and fell and broke his nose. Stated that he saw Dr. Park Breed in [place]. Stated that patient is coming back on Saturday.

## 2019-08-20 NOTE — Telephone Encounter (Signed)
So sorry to hear about his fall.Please let us know whether he needs a follow up appointment at the office.I Wish him a quick recovery.

## 2019-08-22 ENCOUNTER — Other Ambulatory Visit: Payer: Self-pay | Admitting: Internal Medicine

## 2019-08-23 ENCOUNTER — Other Ambulatory Visit: Payer: Self-pay

## 2019-08-23 ENCOUNTER — Ambulatory Visit (INDEPENDENT_AMBULATORY_CARE_PROVIDER_SITE_OTHER): Payer: PPO | Admitting: Family

## 2019-08-23 ENCOUNTER — Encounter: Payer: Self-pay | Admitting: Family

## 2019-08-23 VITALS — BP 160/82 | HR 55 | Temp 97.7°F | Resp 16 | Ht 67.0 in | Wt 192.2 lb

## 2019-08-23 DIAGNOSIS — N1832 Chronic kidney disease, stage 3b: Secondary | ICD-10-CM

## 2019-08-23 DIAGNOSIS — W19XXXD Unspecified fall, subsequent encounter: Secondary | ICD-10-CM | POA: Diagnosis not present

## 2019-08-23 DIAGNOSIS — S022XXD Fracture of nasal bones, subsequent encounter for fracture with routine healing: Secondary | ICD-10-CM | POA: Diagnosis not present

## 2019-08-23 DIAGNOSIS — S0083XD Contusion of other part of head, subsequent encounter: Secondary | ICD-10-CM | POA: Diagnosis not present

## 2019-08-23 DIAGNOSIS — H6122 Impacted cerumen, left ear: Secondary | ICD-10-CM

## 2019-08-23 DIAGNOSIS — E1121 Type 2 diabetes mellitus with diabetic nephropathy: Secondary | ICD-10-CM | POA: Diagnosis not present

## 2019-08-23 MED ORDER — BLOOD GLUCOSE MONITOR KIT: 1.0000 | PACK | Freq: Two times a day (BID) | 0 refills | Status: DC

## 2019-08-23 NOTE — Telephone Encounter (Signed)
Son, [name] notified and scheduled an appointment for Thursday with Dr. Mariea Clonts for ER Follow up. Son is going to have the hospital in [place] fax records to our office. Fax number given.

## 2019-08-23 NOTE — Patient Instructions (Addendum)
-   Take Extra strength tylenol 1000 mg tablet  every 8 hours as needed for pain  - continue on Augmentin as directed from Emergency room  - Referral place today for Ear,Nose and throat specialist for follow up nose fracture and left ear wax removal.  - Notify provider for any signs of infections or drainage from bruised areas on the nose.  - follow up with Ophthalmology to check your eye glasses.

## 2019-08-23 NOTE — Progress Notes (Signed)
Provider: Quillan Whitter FNP-C  Gayland Curry, DO  Patient Care Team: Gayland Curry, DO as PCP - General (Geriatric Medicine) Zebedee Iba., MD as Referring Physician (Ophthalmology) Chesley Mires, MD as Consulting Physician (Pulmonary Disease)  Extended Emergency Contact Information Primary Emergency Contact: Lafferty,Mike Mobile Phone: [phone] Relation: Son Secondary Emergency Contact: Rayos,Matt  [place] Mobile Phone: [phone] Relation: Son  Code Status:  DNR Goals of care: Advanced Directive information Advanced Directives 08/23/2019  Does Patient Have a Medical Advance Directive? Yes  Type of Advance Directive Living will;Healthcare Power of Attorney  Does patient want to make changes to medical advance directive? No - Patient declined  Copy of [place] in Chart? Yes - validated most recent copy scanned in chart (See row information)  Pre-existing out of facility DNR order (yellow form or pink MOST form) -     Chief Complaint  Patient presents with  . Acute Visit    Injured Nose     HPI:  Pt is a 84 y.o. male seen today for an acute visit for follow up ED visit 08/16/2019 post fall episode.He was out of town.He was evaluated at [facility].CT scan of the head without contrast done showed slightly comminuted bilateral nasal bone fractures.No acute intracranial process noted. Augmentin 875/125 mg tablet one by mouth every 12 hrs x 10  Days was prescribed.He denies any fever,chills,nasal congestion or bleeding.States nose ridge still tender and bruised. [place] an [place] would like appointment in [place]. He requests new one touch glucometer current one not working.    Past Medical History:  Diagnosis Date  . Allergy   . Alzheimer's disease ([place])   . Anemia, unspecified   . Anxiety   . Chronic maxillary sinusitis   . COPD (chronic obstructive pulmonary disease)  ([place])   . Depression   . Diabetes mellitus   . Edema   . Hypercalcemia   . Hyperlipidemia   . Hypertension   . Hypertrophy of prostate without urinary obstruction and other lower urinary tract symptoms (LUTS)   . Hypertrophy of prostate without urinary obstruction and other lower urinary tract symptoms (LUTS)   . Kidney disease    mild  . Unspecified disorder of kidney and ureter   . Unspecified sleep apnea   . Ventral hernia, unspecified, without mention of obstruction or gangrene    Past Surgical History:  Procedure Laterality Date  . EXPLORATORY LAPAROTOMY  2002  . HERNIA REPAIR      Allergies  Allergen Reactions  . Ace Inhibitors Cough    Outpatient Encounter Medications as of 08/23/2019  Medication Sig  . acetaminophen (TYLENOL) 500 MG tablet Take 1,000 mg by mouth every 8 (eight) hours as needed.  Marland Kitchen albuterol (VENTOLIN HFA) 108 (90 Base) MCG/ACT inhaler Inhale 2 puffs into the lungs every 6 (six) hours as needed for wheezing or shortness of breath.  Marland Kitchen amLODipine (NORVASC) 10 MG tablet TAKE 1 TABLET BY MOUTH DAILY  . amoxicillin-clavulanate (AUGMENTIN) 875-125 MG tablet Take 1 tablet by mouth 2 (two) times daily.  Marland Kitchen aspirin (ASPIRIN LOW DOSE) 81 MG EC tablet Take 1 tablet (81 mg total) by mouth daily. DX: I50.32  . atorvastatin (LIPITOR) 40 MG tablet TAKE 1 TABLET BY MOUTH EVERY DAY  . BESIVANCE 0.6 % SUSP INSTILL ONE DROP INTO RIGHT EYE 4 TIMES A DAY FOR 2 DAYS AFTER EACH MONTHLY EYE INJECTION  . budesonide-formoterol (SYMBICORT)  160-4.5 MCG/ACT inhaler TAKE TWO PUFFS BY MOUTH TWICE DAILY  . diclofenac sodium (VOLTAREN) 1 % GEL Apply 2 g topically 4 (four) times daily as needed (shoulder pain).  . flurbiprofen (ANSAID) 100 MG tablet Take 100 mg by mouth 3 (three) times daily.  . fluticasone (FLONASE) 50 MCG/ACT nasal spray Place 2 sprays into both nostrils daily.  Marland Kitchen glucose blood (ONE TOUCH ULTRA TEST) test strip Use to test blood sugar twice daily.  DX: E11.9  .  hydrALAZINE (APRESOLINE) 25 MG tablet TAKE 1 TABLET BY MOUTH TWICE A DAY  . ipratropium-albuterol (DUONEB) 0.5-2.5 (3) MG/3ML SOLN Take 3 mLs by nebulization every 6 (six) hours as needed (wheezing).  Marland Kitchen JANUVIA 25 MG tablet TAKE 1 TABLET BY MOUTH EVERY DAY  . Lancet Devices (ONE TOUCH DELICA LANCING DEV) MISC Use to test blood sugar once daily dx: E119  . Lancets (ONETOUCH ULTRASOFT) lancets Use to test blood sugar up to three times daily. Dx E11.9  . losartan (COZAAR) 25 MG tablet TAKE 1 TABLET BY MOUTH EVERY DAY  . metoprolol tartrate (LOPRESSOR) 50 MG tablet TAKE 1 TABLET BY MOUTH TWICE A DAY  . montelukast (SINGULAIR) 10 MG tablet TAKE 1 TABLET BY MOUTH AT BEDTIME  . OVER THE COUNTER MEDICATION 1 application daily. BACTRIM CREAM  . PARoxetine (PAXIL) 30 MG tablet Take 1 tablet (30 mg total) by mouth daily.  . sodium chloride (OCEAN) 0.65 % SOLN nasal spray Place 1 spray into both nostrils as needed for congestion.  . traZODone (DESYREL) 50 MG tablet TAKE 1 TABLET BY MOUTH AT BEDTIME  . trimethoprim-polymyxin b (POLYTRIM) ophthalmic solution INSTILL ONE DROP INTO RIGHT EYE 4 TIMES A DAY FOR 2 DAYS AFTER EACH MONTHLY EYE INJECTION  . Vitamin D, Ergocalciferol, (DRISDOL) 1.25 MG (50000 UNIT) CAPS capsule Take 50,000 Units by mouth once a week.   No facility-administered encounter medications on file as of 08/23/2019.    Review of Systems  Constitutional: Negative for appetite change, chills and fatigue.  HENT: Positive for hearing loss. Negative for congestion, ear pain, rhinorrhea, sinus pressure, sinus pain, sneezing and sore throat.   Eyes: Positive for visual disturbance. Negative for pain, discharge, redness and itching.       Eye glasses broke needs new ones.  Respiratory: Negative for cough, chest tightness, shortness of breath and wheezing.   Cardiovascular: Negative for chest pain, palpitations and leg swelling.  Gastrointestinal: Negative for abdominal distention, abdominal pain,  constipation, diarrhea, nausea and vomiting.  Genitourinary: Negative for difficulty urinating, dysuria, flank pain, frequency and urgency.  Musculoskeletal: Negative for arthralgias and gait problem.  Skin: Negative for color change, pallor and rash.       Bruise  nose.  Neurological: Negative for dizziness, speech difficulty, weakness, light-headedness and headaches.       Occasional numbness and tingling  Hematological: Does not bruise/bleed easily.  Psychiatric/Behavioral: Negative for agitation, confusion and sleep disturbance. The patient is not nervous/anxious.     Immunization History  Administered Date(s) Administered  . Fluad Quad(high Dose 65+) 05/27/2019  . Influenza Split 04/08/2011  . Influenza Whole 04/07/2012  . Influenza, High Dose Seasonal PF 04/07/2017, 05/25/2018, 07/22/2019  . Influenza,inj,Quad PF,6+ Mos 04/23/2013, 03/28/2014, 02/23/2016  . Influenza-Unspecified 04/08/2015, 07/09/2015, 04/08/2017  . Pneumococcal Conjugate-13 06/06/2014  . Pneumococcal-Unspecified 07/09/2007  . Td 08/09/1997  . Tdap 08/12/2011   Pertinent  Health Maintenance Due  Topic Date Due  . HEMOGLOBIN A1C  11/23/2019  . OPHTHALMOLOGY EXAM  11/25/2019  . FOOT EXAM  05/26/2020  . INFLUENZA VACCINE  Completed  . PNA vac Low Risk Adult  Completed   Fall Risk  05/27/2019 01/25/2019 12/18/2018 07/16/2018 07/06/2018  Falls in the past year? 0 0 0 0 0  Number falls in past yr: - 0 0 0 -  Injury with Fall? 0 0 0 0 -       Vitals:   08/23/19 1436  BP: (!) 160/82  Pulse: (!) 55  Resp: 18  Temp: 97.7 F (36.5 C)  SpO2: 93%  Weight: 192 lb 3.2 oz (87.2 kg)  Height: 5' 7" (1.702 m)   Body mass index is 30.1 kg/m. Physical Exam Vitals reviewed.  Constitutional:      General: He is not in acute distress.    Appearance: He is obese. He is not ill-appearing.  HENT:     Head: Normocephalic.     Right Ear: Tympanic membrane, ear canal and external ear normal. There is no impacted  cerumen.     Left Ear: There is impacted cerumen.     Nose: No congestion or rhinorrhea.     Mouth/Throat:     Mouth: Mucous membranes are moist.     Pharynx: Oropharynx is clear. No oropharyngeal exudate or posterior oropharyngeal erythema.  Eyes:     General: No scleral icterus.       Right eye: No discharge.        Left eye: No discharge.     Extraocular Movements: Extraocular movements intact.     Conjunctiva/sclera: Conjunctivae normal.     Pupils: Pupils are equal, round, and reactive to light.  Neck:     Vascular: No carotid bruit.  Cardiovascular:     Rate and Rhythm: Normal rate and regular rhythm.     Pulses: Normal pulses.     Heart sounds: Normal heart sounds. No murmur. No friction rub. No gallop.   Pulmonary:     Effort: Pulmonary effort is normal. No respiratory distress.     Breath sounds: Normal breath sounds. No wheezing, rhonchi or rales.  Chest:     Chest wall: No tenderness.  Abdominal:     General: Bowel sounds are normal. There is no distension.     Palpations: Abdomen is soft. There is no mass.     Tenderness: There is no abdominal tenderness. There is no right CVA tenderness, left CVA tenderness, guarding or rebound.  Musculoskeletal:        General: No swelling or tenderness. Normal range of motion.     Cervical back: Normal range of motion. No rigidity or tenderness.     Right lower leg: No edema.     Left lower leg: No edema.  Lymphadenopathy:     Cervical: No cervical adenopathy.  Skin:    General: Skin is warm and dry.     Coloration: Skin is not pale.     Findings: No rash.     Comments: Bruise on nasal bridge/face scabbed,No signs of infections  Neurological:     Mental Status: He is alert.     Cranial Nerves: No cranial nerve deficit.     Motor: No weakness.     Gait: Gait normal.  Psychiatric:        Mood and Affect: Mood normal.        Behavior: Behavior normal.        Thought Content: Thought content normal.        Cognition and  Memory: Memory is impaired.          Judgment: Judgment normal.    Labs reviewed: Recent Labs    01/25/19 1524 05/26/19 0826  NA 142 140  K 4.3 4.5  CL 107 106  CO2 27 28  GLUCOSE 155* 144*  BUN 42* 43*  CREATININE 2.14* 2.06*  CALCIUM 9.6 9.6   Recent Labs    05/26/19 0826  AST 15  ALT 12  BILITOT 0.4  PROT 6.1   Recent Labs    01/25/19 1524 05/26/19 0826  WBC 8.4 7.6  NEUTROABS 4,838 4,598  HGB 12.1* 12.2*  HCT 35.9* 37.8*  MCV 98.6 97.2  PLT 161 163   No results found for: TSH Lab Results  Component Value Date   HGBA1C 6.6 (H) 05/26/2019   Lab Results  Component Value Date   CHOL 176 05/26/2019   HDL 43 05/26/2019   LDLCALC 107 (H) 05/26/2019   TRIG 144 05/26/2019   CHOLHDL 4.1 05/26/2019    Significant Diagnostic Results in last 30 days:  No results found.  Assessment/Plan 1. Closed fracture of nasal bone with routine healing, subsequent encounter Bruise with progressive healing.No signs of infection. - Continue on Augmentin as directed - Take Extra strength tylenol 1000 mg tablet  every 8 hours as needed for pain - Ambulatory referral to ENT per hospital discharge orders 2. Contusion of face, subsequent encounter Bruise progressive healing no signs of infection. - Notify provider for any signs of infections or drainage from bruised areas on the nose.  3. Type 2 diabetes mellitus with stage 3b chronic kidney disease, without long-term current use of insulin (HCC) No CBG for review. - blood glucose meter kit and supplies KIT; Inject 1 each into the skin 2 (two) times daily. Dispense based on patient and insurance preference. Use up to four times daily as directed. (FOR ICD-9 250.00, 250.01).  Dispense: 1 each; Refill: 0  4. Fall, subsequent encounter Status post ED visit sustained fracture of nasal bone.[place]  5. Impacted cerumen of left ear TM not visualized due to cerumen impaction.  - Ambulatory  referral to ENT  Family/ staff Communication: Reviewed plan of care with patient  Labs/tests ordered: None   Next Appointment: Has upcoming appointment with PCP 01/26/2020    C , NP 

## 2019-08-23 NOTE — Telephone Encounter (Signed)
rx sent to pharmacy by e-script  

## 2019-08-23 NOTE — Telephone Encounter (Signed)
Let's get him scheduled for an emergency room follow up and get his records in advance.  Thanks.

## 2019-08-26 ENCOUNTER — Ambulatory Visit: Payer: PPO | Admitting: Internal Medicine

## 2019-09-01 ENCOUNTER — Other Ambulatory Visit: Payer: Self-pay

## 2019-09-01 DIAGNOSIS — E1122 Type 2 diabetes mellitus with diabetic chronic kidney disease: Secondary | ICD-10-CM

## 2019-09-01 MED ORDER — GLUCOSE BLOOD VI STRP: ORAL_STRIP | 3 refills | Status: DC

## 2019-09-07 ENCOUNTER — Encounter (INDEPENDENT_AMBULATORY_CARE_PROVIDER_SITE_OTHER): Payer: PPO | Admitting: Ophthalmology

## 2019-09-07 DIAGNOSIS — I10 Essential (primary) hypertension: Secondary | ICD-10-CM | POA: Diagnosis not present

## 2019-09-07 DIAGNOSIS — H43813 Vitreous degeneration, bilateral: Secondary | ICD-10-CM

## 2019-09-07 DIAGNOSIS — H353211 Exudative age-related macular degeneration, right eye, with active choroidal neovascularization: Secondary | ICD-10-CM

## 2019-09-07 DIAGNOSIS — H348322 Tributary (branch) retinal vein occlusion, left eye, stable: Secondary | ICD-10-CM | POA: Diagnosis not present

## 2019-09-07 DIAGNOSIS — H35033 Hypertensive retinopathy, bilateral: Secondary | ICD-10-CM | POA: Diagnosis not present

## 2019-09-08 ENCOUNTER — Other Ambulatory Visit: Payer: Self-pay | Admitting: Internal Medicine

## 2019-09-22 ENCOUNTER — Other Ambulatory Visit: Payer: Self-pay | Admitting: Internal Medicine

## 2019-09-22 DIAGNOSIS — E1122 Type 2 diabetes mellitus with diabetic chronic kidney disease: Secondary | ICD-10-CM

## 2019-09-28 DIAGNOSIS — E119 Type 2 diabetes mellitus without complications: Secondary | ICD-10-CM | POA: Diagnosis not present

## 2019-09-28 DIAGNOSIS — H5034 Intermittent alternating exotropia: Secondary | ICD-10-CM | POA: Diagnosis not present

## 2019-09-28 DIAGNOSIS — H31092 Other chorioretinal scars, left eye: Secondary | ICD-10-CM | POA: Diagnosis not present

## 2019-09-28 DIAGNOSIS — H2513 Age-related nuclear cataract, bilateral: Secondary | ICD-10-CM | POA: Diagnosis not present

## 2019-09-28 DIAGNOSIS — H353211 Exudative age-related macular degeneration, right eye, with active choroidal neovascularization: Secondary | ICD-10-CM | POA: Diagnosis not present

## 2019-09-28 DIAGNOSIS — H353122 Nonexudative age-related macular degeneration, left eye, intermediate dry stage: Secondary | ICD-10-CM | POA: Diagnosis not present

## 2019-09-28 DIAGNOSIS — H348322 Tributary (branch) retinal vein occlusion, left eye, stable: Secondary | ICD-10-CM | POA: Diagnosis not present

## 2019-09-28 DIAGNOSIS — H53022 Refractive amblyopia, left eye: Secondary | ICD-10-CM | POA: Diagnosis not present

## 2019-09-28 LAB — HM DIABETES EYE EXAM

## 2019-09-30 ENCOUNTER — Encounter: Payer: Self-pay | Admitting: Internal Medicine

## 2019-10-07 ENCOUNTER — Other Ambulatory Visit: Payer: Self-pay | Admitting: Internal Medicine

## 2019-10-07 DIAGNOSIS — E1122 Type 2 diabetes mellitus with diabetic chronic kidney disease: Secondary | ICD-10-CM

## 2019-10-07 NOTE — Telephone Encounter (Signed)
rx sent to pharmacy by e-script  

## 2019-10-26 ENCOUNTER — Encounter (INDEPENDENT_AMBULATORY_CARE_PROVIDER_SITE_OTHER): Payer: PPO | Admitting: Ophthalmology

## 2019-11-20 ENCOUNTER — Other Ambulatory Visit: Payer: Self-pay | Admitting: Internal Medicine

## 2019-11-20 DIAGNOSIS — N1831 Chronic kidney disease, stage 3a: Secondary | ICD-10-CM

## 2019-11-25 ENCOUNTER — Other Ambulatory Visit: Payer: Self-pay | Admitting: Internal Medicine

## 2019-11-25 ENCOUNTER — Encounter (INDEPENDENT_AMBULATORY_CARE_PROVIDER_SITE_OTHER): Payer: PPO | Admitting: Ophthalmology

## 2019-11-25 MED ORDER — BUDESONIDE-FORMOTEROL FUMARATE 160-4.5 MCG/ACT IN AERO: INHALATION_SPRAY | RESPIRATORY_TRACT | 5 refills | Status: DC

## 2019-11-25 NOTE — Telephone Encounter (Signed)
Patient states that he needs Rx for Symbicort Inhaler.  He will pick up from [facility] on [address].  Patient can be reached at [phone].  Thank you  Ian [name]

## 2019-11-25 NOTE — Telephone Encounter (Signed)
Patient requested refill Pended Rx and sent to [facility] for approval.

## 2019-12-03 ENCOUNTER — Telehealth: Payer: Self-pay

## 2019-12-03 ENCOUNTER — Telehealth: Payer: Self-pay | Admitting: Internal Medicine

## 2019-12-03 NOTE — Telephone Encounter (Signed)
Called and spoke with son, Catalina Antigua. He wanted to know if we had a copy of patient's [place] on file. Explained to son that we wouldn't have a copy of the patient's Policy on file. I did double check out "Media" to be sure, but no copy on file.  Son just stated he was trying to help his dad out because he was hard of hearing and couldn't call himself.

## 2019-12-03 NOTE — Telephone Encounter (Signed)
Matt called about his dad saying that he has an appointment for cataract surgery next week, and need a referral for home health to come out and give dad his medications. He knows he have a  In home care policy but does not know where it is or how to get the information about the policy due to his dad's confusion and none hearing. Please advise. Matt lives in [place]

## 2019-12-03 NOTE — Telephone Encounter (Signed)
Ian [name]  Patient's son Ian [name] returned your call regarding his father Ian [name].  Please call Ian [name] back at [phone].  Thank you  Ian [name]

## 2019-12-03 NOTE — Telephone Encounter (Signed)
Please call Matt back and read him the following:   Typically, home health (as in the kind we order), only comes out 2-3 time per week maximum as part of medicare.    A home care agency (like [facility], [facility], etc) can come according to a patient's needs.  Their services are NOT covered by medicare, but, in some cases are covered by a long-term care insurance plan.  In order to get that coverage up front, paperwork needs to be obtained from the insurance and completed by [facility] and a portion by our office and this takes time.  The insurance agent that sold them the policy or their company would be a good place to start to check on what's covered and to get a copy of the policy in hand.  I agree that his dad will definitely need help managing the array of eye drops related to cataract surgery, but this usually means as many as 4 times per day administering eye drops.  Family members may need to help out to do this since it is coming up so soon.  How can we help?

## 2019-12-07 NOTE — Telephone Encounter (Signed)
Gave message to Ian [name] he is going to call Catalina Antigua, he stated that they will be giving him a combo eye drop, family will also step in to help. Catalina Antigua is going over to his dad this am to look for the policy.

## 2019-12-13 ENCOUNTER — Other Ambulatory Visit: Payer: Self-pay | Admitting: *Deleted

## 2019-12-13 MED ORDER — BUDESONIDE-FORMOTEROL FUMARATE 160-4.5 MCG/ACT IN AERO: INHALATION_SPRAY | RESPIRATORY_TRACT | 5 refills | Status: DC

## 2019-12-13 NOTE — Telephone Encounter (Signed)
Patient requested.  Pharmacy did not receive last refill.

## 2019-12-17 ENCOUNTER — Other Ambulatory Visit: Payer: Self-pay | Admitting: Internal Medicine

## 2019-12-17 ENCOUNTER — Other Ambulatory Visit: Payer: Self-pay | Admitting: Family

## 2019-12-17 DIAGNOSIS — F418 Other specified anxiety disorders: Secondary | ICD-10-CM

## 2019-12-17 DIAGNOSIS — H25811 Combined forms of age-related cataract, right eye: Secondary | ICD-10-CM | POA: Diagnosis not present

## 2019-12-17 NOTE — Telephone Encounter (Signed)
Pharmacy requested refill Pended Rx and sent to Dr. Mariea Clonts for approval due to [place].

## 2019-12-17 NOTE — Telephone Encounter (Signed)
I signed the prescription for his antidepressant; however, I noted that his med list includes an nsaid medication--flurbiprofen (that interacts with the antidepressant) three times a day routinely since October. I recommend he come off of that pain medication at this point because it puts him at risk of stomach upset and bleeding and can worsen his kidney function which has already been a concern (due to his diabetes and hypertension).  Please call his son about this.  [facility] may need [facility] that med out when he's due for his next order.  I have not seen him myself in a while, but I see he has a visit with me in July.

## 2019-12-20 ENCOUNTER — Telehealth: Payer: Self-pay | Admitting: *Deleted

## 2019-12-20 ENCOUNTER — Encounter (INDEPENDENT_AMBULATORY_CARE_PROVIDER_SITE_OTHER): Payer: PPO | Admitting: Ophthalmology

## 2019-12-20 ENCOUNTER — Other Ambulatory Visit: Payer: Self-pay

## 2019-12-20 DIAGNOSIS — H43813 Vitreous degeneration, bilateral: Secondary | ICD-10-CM | POA: Diagnosis not present

## 2019-12-20 DIAGNOSIS — H348322 Tributary (branch) retinal vein occlusion, left eye, stable: Secondary | ICD-10-CM | POA: Diagnosis not present

## 2019-12-20 DIAGNOSIS — H353211 Exudative age-related macular degeneration, right eye, with active choroidal neovascularization: Secondary | ICD-10-CM

## 2019-12-20 DIAGNOSIS — I10 Essential (primary) hypertension: Secondary | ICD-10-CM | POA: Diagnosis not present

## 2019-12-20 DIAGNOSIS — H35033 Hypertensive retinopathy, bilateral: Secondary | ICD-10-CM

## 2019-12-20 NOTE — Telephone Encounter (Signed)
Spoke with [name], son and he stated that he does his dad's pills and patient is not on Flurbiprofen.  Updated medication list.

## 2019-12-20 NOTE — Telephone Encounter (Signed)
Patient son, [name] called and stated that patient had Cataract Surgery Friday. Son stated that he stayed with him this weekend and patient fell Saturday night. Son thinks it was due to taking the Trazadone and wonders if he even needs to be taking it.  Stated that patient is getting up 3-5 times a night and now he is worried about him falling when he is not there.  Son doesn't think he needs the Trazadone anymore but wants your opinion. (stated he took it to help him sleep when his wife died) Please Advise.

## 2019-12-20 NOTE — Telephone Encounter (Signed)
It's ok to stop trazodone and see if he is still able to sleep ok.  I hope he can do without the medication also.  Certainly don't want him falling!

## 2019-12-20 NOTE — Telephone Encounter (Signed)
Son notified and agreed.

## 2020-01-24 ENCOUNTER — Encounter: Payer: Self-pay | Admitting: Internal Medicine

## 2020-01-24 ENCOUNTER — Ambulatory Visit (INDEPENDENT_AMBULATORY_CARE_PROVIDER_SITE_OTHER): Payer: PPO | Admitting: Internal Medicine

## 2020-01-24 ENCOUNTER — Other Ambulatory Visit: Payer: Self-pay

## 2020-01-24 VITALS — BP 130/72 | HR 62 | Temp 97.3°F | Ht 67.0 in | Wt 184.5 lb

## 2020-01-24 DIAGNOSIS — I5032 Chronic diastolic (congestive) heart failure: Secondary | ICD-10-CM | POA: Diagnosis not present

## 2020-01-24 DIAGNOSIS — E785 Hyperlipidemia, unspecified: Secondary | ICD-10-CM

## 2020-01-24 DIAGNOSIS — G3184 Mild cognitive impairment, so stated: Secondary | ICD-10-CM | POA: Diagnosis not present

## 2020-01-24 DIAGNOSIS — N1832 Chronic kidney disease, stage 3b: Secondary | ICD-10-CM

## 2020-01-24 DIAGNOSIS — E1169 Type 2 diabetes mellitus with other specified complication: Secondary | ICD-10-CM

## 2020-01-24 DIAGNOSIS — D692 Other nonthrombocytopenic purpura: Secondary | ICD-10-CM | POA: Diagnosis not present

## 2020-01-24 DIAGNOSIS — E1121 Type 2 diabetes mellitus with diabetic nephropathy: Secondary | ICD-10-CM

## 2020-01-24 DIAGNOSIS — N1831 Chronic kidney disease, stage 3a: Secondary | ICD-10-CM

## 2020-01-24 DIAGNOSIS — E1159 Type 2 diabetes mellitus with other circulatory complications: Secondary | ICD-10-CM | POA: Diagnosis not present

## 2020-01-24 DIAGNOSIS — E1122 Type 2 diabetes mellitus with diabetic chronic kidney disease: Secondary | ICD-10-CM

## 2020-01-24 DIAGNOSIS — I10 Essential (primary) hypertension: Secondary | ICD-10-CM | POA: Diagnosis not present

## 2020-01-24 DIAGNOSIS — J4489 Other specified chronic obstructive pulmonary disease: Secondary | ICD-10-CM

## 2020-01-24 DIAGNOSIS — I152 Hypertension secondary to endocrine disorders: Secondary | ICD-10-CM

## 2020-01-24 DIAGNOSIS — J449 Chronic obstructive pulmonary disease, unspecified: Secondary | ICD-10-CM | POA: Diagnosis not present

## 2020-01-24 NOTE — Progress Notes (Signed)
Location:  [facility] clinic Provider:  Mariadelcarmen Corella L. Mariea Clonts, D.O., C.M.D.  Code Status: DNR Goals of Care:  Advanced Directives 01/24/2020  Does Patient Have a Medical Advance Directive? Yes  Type of Advance Directive Out of facility DNR (pink MOST or yellow form);Healthcare Power of Attorney  Does patient want to make changes to medical advance directive? No - Patient declined  Copy of [place] in Chart? Yes - validated most recent copy scanned in chart (See row information)  Pre-existing out of facility DNR order (yellow form or pink MOST form) Pink MOST/Yellow Form most recent copy in chart - Physician notified to receive inpatient order     Chief Complaint  Patient presents with  . Medical Management of Chronic Issues    6 month follow up     HPI: Patient is a 84 y.o. male seen today for medical management of chronic diseases.    He still misses his wife.    [place] the beach [place].  He was parked at the shops and could not lift his leg enough to get up over the curb.  He fell right on his face and broke his nose.   No falls since he broke his nose.  [facility].    No pain.   Sleeping pretty well.    Saw Dr. Katy Fitch in march after cataract surgery.  Eyes are doing better.  Does have his injection with Dr. Zigmund Daniel the end of the month for his right eye.    He's lost some weight doing walking and going to the gym.  He wants to be around a bit longer.   Sugars are doing well--130 to 140/150.  Takes his januvia 15m (ckd), baby asa, lipitor.  BP was good at 130/72.    Breathing doing well.  Does use his albuterol 3x per day.  Does his symbicort consistently bid.  No need for nebs in a long time.  Asks about his bruising on his arms.    [place]  He goes by himself.    Past Medical History:  Diagnosis Date  . Allergy   . Alzheimer's disease ([place]   . Anemia,  unspecified   . Anxiety   . Chronic maxillary sinusitis   . COPD (chronic obstructive pulmonary disease) ([place]   . Depression   . Diabetes mellitus   . Edema   . Hypercalcemia   . Hyperlipidemia   . Hypertension   . Hypertrophy of prostate without urinary obstruction and other lower urinary tract symptoms (LUTS)   . Hypertrophy of prostate without urinary obstruction and other lower urinary tract symptoms (LUTS)   . Kidney disease    mild  . Unspecified disorder of kidney and ureter   . Unspecified sleep apnea   . Ventral hernia, unspecified, without mention of obstruction or gangrene     Past Surgical History:  Procedure Laterality Date  . EXPLORATORY LAPAROTOMY  2002  . HERNIA REPAIR      Allergies  Allergen Reactions  . Ace Inhibitors Cough    Outpatient Encounter Medications as of 01/24/2020  Medication Sig  . acetaminophen (TYLENOL) 500 MG tablet Take 1,000 mg by mouth every 8 (eight) hours as needed.  .Marland Kitchenalbuterol (VENTOLIN HFA) 108 (90 Base) MCG/ACT inhaler Inhale 2 puffs into the lungs every 6 (six) hours as needed for wheezing or shortness of breath.  .Marland Kitchen  amLODipine (NORVASC) 10 MG tablet TAKE 1 TABLET BY MOUTH EVERY DAY  . ASPIRIN LOW DOSE 81 MG EC tablet TAKE 1 TABLET BY MOUTH EVERY DAY  . atorvastatin (LIPITOR) 40 MG tablet TAKE 1 TABLET BY MOUTH EVERY DAY  . BESIVANCE 0.6 % SUSP INSTILL ONE DROP INTO RIGHT EYE 4 TIMES A DAY FOR 2 DAYS AFTER EACH MONTHLY EYE INJECTION  . blood glucose meter kit and supplies KIT Inject 1 each into the skin 2 (two) times daily. Dispense based on patient and insurance preference. Use up to four times daily as directed. (FOR ICD-9 250.00, 250.01).  . budesonide-formoterol (SYMBICORT) 160-4.5 MCG/ACT inhaler TAKE TWO PUFFS BY MOUTH TWICE DAILY  . diclofenac sodium (VOLTAREN) 1 % GEL Apply 2 g topically 4 (four) times daily as needed (shoulder pain).  . fluticasone (FLONASE) 50 MCG/ACT nasal spray Place 2 sprays into both nostrils daily.    Marland Kitchen glucose blood (ONE TOUCH ULTRA TEST) test strip Use to test blood sugar twice daily.  DX: E11.9  . hydrALAZINE (APRESOLINE) 25 MG tablet TAKE 1 TABLET BY MOUTH TWICE A DAY  . ipratropium-albuterol (DUONEB) 0.5-2.5 (3) MG/3ML SOLN Take 3 mLs by nebulization every 6 (six) hours as needed (wheezing).  Marland Kitchen JANUVIA 25 MG tablet TAKE 1 TABLET BY MOUTH EVERY DAY  . Lancet Devices (ONE TOUCH DELICA LANCING DEV) MISC Use to test blood sugar once daily dx: E119  . Lancets (ONETOUCH ULTRASOFT) lancets Use to test blood sugar up to three times daily. Dx E11.9  . losartan (COZAAR) 25 MG tablet TAKE 1 TABLET BY MOUTH EVERY DAY  . metoprolol tartrate (LOPRESSOR) 50 MG tablet TAKE 1 TABLET BY MOUTH TWICE A DAY  . montelukast (SINGULAIR) 10 MG tablet TAKE 1 TABLET BY MOUTH AT BEDTIME  . OVER THE COUNTER MEDICATION 1 application daily. BACTRIM CREAM  . PARoxetine (PAXIL) 30 MG tablet TAKE 1 TABLET BY MOUTH EVERY DAY  . sodium chloride (OCEAN) 0.65 % SOLN nasal spray Place 1 spray into both nostrils as needed for congestion.  Marland Kitchen trimethoprim-polymyxin b (POLYTRIM) ophthalmic solution INSTILL ONE DROP INTO RIGHT EYE 4 TIMES A DAY FOR 2 DAYS AFTER EACH MONTHLY EYE INJECTION  . Vitamin D, Ergocalciferol, (DRISDOL) 1.25 MG (50000 UNIT) CAPS capsule Take 50,000 Units by mouth once a week.   No facility-administered encounter medications on file as of 01/24/2020.    Review of Systems:  Review of Systems  Constitutional: Negative for chills, fever and malaise/fatigue.  HENT: Positive for hearing loss. Negative for congestion and sore throat.   Eyes: Negative for blurred vision (had cataract surgery which helped a lot, has macular and getting shots in right eye).  Respiratory: Negative for cough, shortness of breath and wheezing.   Cardiovascular: Negative for chest pain, palpitations and leg swelling.  Gastrointestinal: Negative for abdominal pain, blood in stool, constipation and diarrhea.  Genitourinary: Negative  for dysuria.  Musculoskeletal: Negative for falls, joint pain and myalgias.       No falls since feb  Skin: Negative for itching and rash.  Neurological: Negative for dizziness and loss of consciousness.  Endo/Heme/Allergies: Bruises/bleeds easily.  Psychiatric/Behavioral: Positive for memory loss. Negative for depression. The patient is not nervous/anxious and does not have insomnia.        Grief    Health Maintenance  Topic Date Due  . HEMOGLOBIN A1C  11/23/2019  . INFLUENZA VACCINE  02/06/2020  . FOOT EXAM  05/26/2020  . OPHTHALMOLOGY EXAM  09/27/2020  . TETANUS/TDAP  08/11/2021  .  COVID-19 Vaccine  Completed  . PNA vac Low Risk Adult  Completed    Physical Exam: Vitals:   01/24/20 0819  BP: 130/72  Pulse: 62  Temp: (!) 97.3 F (36.3 C)  SpO2: 94%  Weight: 184 lb 8 oz (83.7 kg)  Height: 5' 7"  (1.702 m)   Body mass index is 28.9 kg/m. Physical Exam Vitals reviewed.  Constitutional:      Appearance: Normal appearance.  HENT:     Head: Normocephalic and atraumatic.     Ears:     Comments: Hearing aids Eyes:     Comments: glasses  Cardiovascular:     Rate and Rhythm: Normal rate and regular rhythm.     Pulses: Normal pulses.     Heart sounds: Normal heart sounds.  Pulmonary:     Effort: Pulmonary effort is normal.     Comments: Crackles left greater than right base  Abdominal:     General: Bowel sounds are normal.  Musculoskeletal:        General: Normal range of motion.     Right lower leg: No edema.     Left lower leg: No edema.     Comments: Walks with limp (left hip)  Skin:    General: Skin is warm and dry.  Neurological:     General: No focal deficit present.     Mental Status: He is alert and oriented to person, place, and time.     Cranial Nerves: No cranial nerve deficit.     Motor: No weakness.     Gait: Gait abnormal.  Psychiatric:        Mood and Affect: Mood normal.        Behavior: Behavior normal.     Labs reviewed: Basic Metabolic  Panel: Recent Labs    01/25/19 1524 05/26/19 0826  NA 142 140  K 4.3 4.5  CL 107 106  CO2 27 28  GLUCOSE 155* 144*  BUN 42* 43*  CREATININE 2.14* 2.06*  CALCIUM 9.6 9.6   Liver Function Tests: Recent Labs    05/26/19 0826  AST 15  ALT 12  BILITOT 0.4  PROT 6.1   No results for input(s): LIPASE, AMYLASE in the last 8760 hours. No results for input(s): AMMONIA in the last 8760 hours. CBC: Recent Labs    01/25/19 1524 05/26/19 0826  WBC 8.4 7.6  NEUTROABS 4,838 4,598  HGB 12.1* 12.2*  HCT 35.9* 37.8*  MCV 98.6 97.2  PLT 161 163   Lipid Panel: Recent Labs    05/26/19 0826  CHOL 176  HDL 43  LDLCALC 107*  TRIG 144  CHOLHDL 4.1   Lab Results  Component Value Date   HGBA1C 6.6 (H) 05/26/2019    Procedures since last visit: No results found.  Assessment/Plan 1. Hypertension associated with diabetes ([place]) -bp controlled with current regimen, cont same and monitor - BASIC METABOLIC PANEL WITH GFR; Future  2. Chronic diastolic CHF (congestive heart failure) (HCC) -euvolemic, cont same regimen and monitor  3. Chronic kidney disease (CKD) stage G3a/A2, moderately decreased glomerular filtration rate (GFR) between 45-59 mL/min/1.73 square meter and albuminuria creatinine ratio between 30-299 mg/g -Avoid nephrotoxic agents like nsaids, dose adjust renally excreted meds, hydrate.  4. Hyperlipidemia associated with type 2 diabetes mellitus ([place]) -not fasting to reassess -check next time -cont current meds and monitor  5. Type 2 diabetes mellitus with stage 3a chronic kidney disease, without long-term current use of insulin ([place]) -cont same [place] and monitor -  Lipid panel; Future - Hemoglobin A1c; Future  6. COPD with asthma ([place]) -using albuterol tid and symbicort bid, doing well  7. Mild cognitive impairment with memory loss MMSE - Mini Mental State Exam 07/26/2019 07/06/2018 07/02/2017  Orientation to time 5 5 5   Orientation to Place 5 5 5     Registration 3 3 3   Attention/ Calculation 5 5 5   Recall 3 2 1   Language- name 2 objects 2 2 2   Language- repeat 1 1 1   Language- follow 3 step command 3 3 3   Language- read & follow direction 1 1 1   Write a sentence 1 1 1   Copy design 0 0 0  Total score 29 28 27   -remains quite functional, needs some assistance with meds from his son, still drives  8. Senile purpura ([place]) -counseled that easy bruising is due to advanced age and thinning skin  9. Type 2 diabetes mellitus with stage 3b chronic kidney disease, without long-term current use of insulin (HCC) - sounds like he's well-controlled, check hba1c which is overdue - Hemoglobin F9P - Basic metabolic panel - CBC with Differential/Platelet   Labs/tests ordered:  * No order type specified * Next appt:  Visit date not found   Nastasha Reising L. Bluford Sedler, D.O. [facility] [address], [place] [address] Cell Phone (Mon-Fri 8am-5pm):  [phone] On Call:  [phone] & follow prompts after 5pm & weekends Office Phone:  [phone] Office Fax:  [phone]

## 2020-01-25 ENCOUNTER — Other Ambulatory Visit: Payer: Self-pay

## 2020-01-25 DIAGNOSIS — N1831 Chronic kidney disease, stage 3a: Secondary | ICD-10-CM

## 2020-01-25 LAB — CBC WITH DIFFERENTIAL/PLATELET
Absolute Monocytes: 866 cells/uL (ref 200–950)
Basophils Absolute: 53 cells/uL (ref 0–200)
Basophils Relative: 0.7 %
Eosinophils Absolute: 160 cells/uL (ref 15–500)
Eosinophils Relative: 2.1 %
HCT: 36.3 % — ABNORMAL LOW (ref 38.5–50.0)
Hemoglobin: 11.9 g/dL — ABNORMAL LOW (ref 13.2–17.1)
Lymphs Abs: 1984 cells/uL (ref 850–3900)
MCH: 32.4 pg (ref 27.0–33.0)
MCHC: 32.8 g/dL (ref 32.0–36.0)
MCV: 98.9 fL (ref 80.0–100.0)
MPV: 10.1 fL (ref 7.5–12.5)
Monocytes Relative: 11.4 %
Neutro Abs: 4537 cells/uL (ref 1500–7800)
Neutrophils Relative %: 59.7 %
Platelets: 148 10*3/uL (ref 140–400)
RBC: 3.67 10*6/uL — ABNORMAL LOW (ref 4.20–5.80)
RDW: 13.4 % (ref 11.0–15.0)
Total Lymphocyte: 26.1 %
WBC: 7.6 10*3/uL (ref 3.8–10.8)

## 2020-01-25 LAB — BASIC METABOLIC PANEL
BUN/Creatinine Ratio: 20 (calc) (ref 6–22)
BUN: 54 mg/dL — ABNORMAL HIGH (ref 7–25)
CO2: 27 mmol/L (ref 20–32)
Calcium: 9.5 mg/dL (ref 8.6–10.3)
Chloride: 108 mmol/L (ref 98–110)
Creat: 2.64 mg/dL — ABNORMAL HIGH (ref 0.70–1.11)
Glucose, Bld: 98 mg/dL (ref 65–139)
Potassium: 4.5 mmol/L (ref 3.5–5.3)
Sodium: 140 mmol/L (ref 135–146)

## 2020-01-25 LAB — HEMOGLOBIN A1C
Hgb A1c MFr Bld: 5.9 % of total Hgb — ABNORMAL HIGH (ref <5.7)
Mean Plasma Glucose: 123 (calc)
eAG (mmol/L): 6.8 (calc)

## 2020-01-25 NOTE — Progress Notes (Signed)
Please inform his son: Kidney function has worsened again since last check in Nov '20 (creatinine up to 2.64 from 2.06 He should not be taking any nsaids--aleve/naproxen, ibuprofen/motrin/advil, goody's, etc.  Only oral tylenol or topical medications should be used for arthritis pain.   He also needs to be drinking 6 8oz glasses of water each day to remain hydrated especially in the heat.   I recommend his son get mychart access since Mr. Hampshire's memory is poor at times. Sugar average has improved considerably to 5.9 from 6.6 ([facility]) Anemia is slightly worse (likely due to his declining kidney function) I would like him to come back in one week after avoiding nsaids and drinking more water to recheck his BMP lab test for his kidneys.

## 2020-01-27 ENCOUNTER — Encounter (INDEPENDENT_AMBULATORY_CARE_PROVIDER_SITE_OTHER): Payer: PPO | Admitting: Ophthalmology

## 2020-01-27 ENCOUNTER — Other Ambulatory Visit: Payer: Self-pay

## 2020-01-27 DIAGNOSIS — H43813 Vitreous degeneration, bilateral: Secondary | ICD-10-CM | POA: Diagnosis not present

## 2020-01-27 DIAGNOSIS — I10 Essential (primary) hypertension: Secondary | ICD-10-CM

## 2020-01-27 DIAGNOSIS — H353211 Exudative age-related macular degeneration, right eye, with active choroidal neovascularization: Secondary | ICD-10-CM

## 2020-01-27 DIAGNOSIS — H35033 Hypertensive retinopathy, bilateral: Secondary | ICD-10-CM | POA: Diagnosis not present

## 2020-01-27 DIAGNOSIS — H348322 Tributary (branch) retinal vein occlusion, left eye, stable: Secondary | ICD-10-CM | POA: Diagnosis not present

## 2020-01-28 ENCOUNTER — Telehealth: Payer: Self-pay

## 2020-01-28 NOTE — Telephone Encounter (Signed)
Please refer to labs dated 01/24/2020. Patient was instructed to have a BMP follow-up in 1 week.   Message left on voicemail from patients son stating patient has a pending appointment the first week of August with the Kidney specialist and questions if he needs BMP follow-up here  Please advise

## 2020-01-28 NOTE — Telephone Encounter (Signed)
I was not aware of his kidney appointment.   I'd like to get a copy of the kidney specialist's note and labs done there after that appointment.  Ok to cancel bmp here as long as pt keeps the kidney appt and I get a copy of his results.

## 2020-01-28 NOTE — Telephone Encounter (Signed)
Discussed response with [name]. [name] verbalized understanding and stated he will make note for patient to give to kidney specialist requesting that they send OV note and labs to Dr.[name].

## 2020-01-31 ENCOUNTER — Encounter (INDEPENDENT_AMBULATORY_CARE_PROVIDER_SITE_OTHER): Payer: PPO | Admitting: Ophthalmology

## 2020-02-14 DIAGNOSIS — I129 Hypertensive chronic kidney disease with stage 1 through stage 4 chronic kidney disease, or unspecified chronic kidney disease: Secondary | ICD-10-CM | POA: Diagnosis not present

## 2020-02-14 DIAGNOSIS — N2581 Secondary hyperparathyroidism of renal origin: Secondary | ICD-10-CM | POA: Diagnosis not present

## 2020-02-14 DIAGNOSIS — E1122 Type 2 diabetes mellitus with diabetic chronic kidney disease: Secondary | ICD-10-CM | POA: Diagnosis not present

## 2020-02-14 DIAGNOSIS — N183 Chronic kidney disease, stage 3 unspecified: Secondary | ICD-10-CM | POA: Diagnosis not present

## 2020-02-28 ENCOUNTER — Other Ambulatory Visit: Payer: Self-pay | Admitting: *Deleted

## 2020-02-28 MED ORDER — BUDESONIDE-FORMOTEROL FUMARATE 160-4.5 MCG/ACT IN AERO: INHALATION_SPRAY | RESPIRATORY_TRACT | 5 refills | Status: DC

## 2020-02-28 NOTE — Telephone Encounter (Signed)
Patient requested refill

## 2020-02-29 ENCOUNTER — Other Ambulatory Visit: Payer: Self-pay | Admitting: Internal Medicine

## 2020-02-29 NOTE — Telephone Encounter (Signed)
rx sent to pharmacy by e-script  

## 2020-03-03 ENCOUNTER — Encounter: Payer: Self-pay | Admitting: Nurse Practitioner

## 2020-03-03 ENCOUNTER — Ambulatory Visit (INDEPENDENT_AMBULATORY_CARE_PROVIDER_SITE_OTHER): Payer: PPO | Admitting: Nurse Practitioner

## 2020-03-03 ENCOUNTER — Other Ambulatory Visit: Payer: Self-pay

## 2020-03-03 ENCOUNTER — Telehealth: Payer: Self-pay | Admitting: *Deleted

## 2020-03-03 DIAGNOSIS — K5904 Chronic idiopathic constipation: Secondary | ICD-10-CM

## 2020-03-03 DIAGNOSIS — T148XXA Other injury of unspecified body region, initial encounter: Secondary | ICD-10-CM

## 2020-03-03 DIAGNOSIS — H6121 Impacted cerumen, right ear: Secondary | ICD-10-CM | POA: Diagnosis not present

## 2020-03-03 NOTE — Telephone Encounter (Signed)
1)  Have we gotten a report on the labs from [facility]?  I don't remember seeing one recently (like yesterday when I went through everything on my desk).  Perhaps, something came today?   2)  I recommend he take miralax 17g in 8oz of water or juice now.  If he still does not have a bm by 9pm, take a second dose.  He will need to stay close to the restroom because it may work suddenly.

## 2020-03-03 NOTE — Telephone Encounter (Signed)
Patient is seeing Sherrie Mustache NP as we speak she gave him an enema and  Started him on Miralax 17 grams. Talked to [facility] to [place] and she is going to fax the results over so you can see them before his next appointment which is on Monday.

## 2020-03-03 NOTE — Telephone Encounter (Signed)
1.)  Son, [name] called and stated that patient has been having Labs drawn for his kidney function. Stated that he received our lab results but he has been trying to call [facility], Dr. Posey Pronto and they will not return his call regarding his results.  Son has requested to them that his records be sent to Dr. Mariea Clonts.  Son is wanting to know if you could tell him those results since they will not return his call.   2.) ALSO patient has not had a bowel movement in 3-4 days. Son is wondering if something can be given to help him with this.   Please Advise.

## 2020-03-03 NOTE — Progress Notes (Signed)
Careteam: Patient Care Team: Gayland Curry, DO as PCP - General (Geriatric Medicine) Zebedee Iba., MD as Referring Physician (Ophthalmology) Chesley Mires, MD as Consulting Physician (Pulmonary Disease)  PLACE OF SERVICE:  [facility]  Advanced Directive information    Allergies  Allergen Reactions  . Ace Inhibitors Cough    Chief Complaint  Patient presents with  . Acute Visit    Constipation x 3 days, tried OTC remedies with no relief. Patient also c/o ear fullness.   . Arm Problem    Obsrvation, patient with scratch on right forearm, blood on shirt.      HPI: Patient is a 84 y.o. male for constipation Reports he generally has a BM every day Has not had one for 3 days States he normally takes a stool softener daily and has added exlax but not had results.  No abdominal pain or pressure. No nausea or vomiting Feels like his abdomen in tight.   Was informed by audiologist that he had a buildup of wax in ear.  Wears hearing aids  scratched himself on the way over to office.   Review of Systems:  Review of Systems  Constitutional: Negative for chills, fever and malaise/fatigue.  HENT: Positive for hearing loss.   Gastrointestinal: Positive for constipation. Negative for abdominal pain, blood in stool, diarrhea, heartburn, nausea and vomiting.    Past Medical History:  Diagnosis Date  . Allergy   . Alzheimer's disease ([place])   . Anemia, unspecified   . Anxiety   . Chronic maxillary sinusitis   . COPD (chronic obstructive pulmonary disease) ([place])   . Depression   . Diabetes mellitus   . Edema   . Hypercalcemia   . Hyperlipidemia   . Hypertension   . Hypertrophy of prostate without urinary obstruction and other lower urinary tract symptoms (LUTS)   . Hypertrophy of prostate without urinary obstruction and other lower urinary tract symptoms (LUTS)   . Kidney disease    mild  . Unspecified disorder of kidney and ureter   . Unspecified sleep apnea   .  Ventral hernia, unspecified, without mention of obstruction or gangrene    Past Surgical History:  Procedure Laterality Date  . EXPLORATORY LAPAROTOMY  2002  . HERNIA REPAIR     Social History:   reports that he quit smoking about 31 years ago. His smoking use included cigarettes. He has a 45.00 pack-year smoking history. He has never used smokeless tobacco. He reports that he does not drink alcohol and does not use drugs.  Family History  Problem Relation Age of Onset  . Pancreatic cancer Sister   . Heart disease Father   . Hypertension Brother   . Hypertension Sister     Medications: Patient's Medications  New Prescriptions   No medications on file  Previous Medications   ACETAMINOPHEN (TYLENOL) 500 MG TABLET    Take 1,000 mg by mouth every 8 (eight) hours as needed.   ALBUTEROL (VENTOLIN HFA) 108 (90 BASE) MCG/ACT INHALER    Inhale 2 puffs into the lungs every 6 (six) hours as needed for wheezing or shortness of breath.   AMLODIPINE (NORVASC) 10 MG TABLET    TAKE 1 TABLET BY MOUTH EVERY DAY   ASPIRIN LOW DOSE 81 MG EC TABLET    TAKE 1 TABLET BY MOUTH EVERY DAY   ATORVASTATIN (LIPITOR) 40 MG TABLET    TAKE 1 TABLET BY MOUTH EVERY DAY   BESIVANCE 0.6 % SUSP  INSTILL ONE DROP INTO RIGHT EYE 4 TIMES A DAY FOR 2 DAYS AFTER EACH MONTHLY EYE INJECTION   BLOOD GLUCOSE METER KIT AND SUPPLIES KIT    Inject 1 each into the skin 2 (two) times daily. Dispense based on patient and insurance preference. Use up to four times daily as directed. (FOR ICD-9 250.00, 250.01).   BUDESONIDE-FORMOTEROL (SYMBICORT) 160-4.5 MCG/ACT INHALER    TAKE TWO PUFFS BY MOUTH TWICE DAILY   DICLOFENAC SODIUM (VOLTAREN) 1 % GEL    Apply 2 g topically 4 (four) times daily as needed (shoulder pain).   FLUTICASONE (FLONASE) 50 MCG/ACT NASAL SPRAY    Place 2 sprays into both nostrils daily.   GLUCOSE BLOOD (ONE TOUCH ULTRA TEST) TEST STRIP    Use to test blood sugar twice daily.  DX: E11.9   HYDRALAZINE (APRESOLINE) 25  MG TABLET    TAKE 1 TABLET BY MOUTH TWICE A DAY   IPRATROPIUM-ALBUTEROL (DUONEB) 0.5-2.5 (3) MG/3ML SOLN    Take 3 mLs by nebulization every 6 (six) hours as needed (wheezing).   JANUVIA 25 MG TABLET    TAKE 1 TABLET BY MOUTH EVERY DAY   LANCET DEVICES (ONE TOUCH DELICA LANCING DEV) MISC    Use to test blood sugar once daily dx: E119   LANCETS (ONETOUCH ULTRASOFT) LANCETS    Use to test blood sugar up to three times daily. Dx E11.9   LOSARTAN (COZAAR) 25 MG TABLET    TAKE 1 TABLET BY MOUTH EVERY DAY   METOPROLOL TARTRATE (LOPRESSOR) 50 MG TABLET    TAKE 1 TABLET BY MOUTH TWICE A DAY   MONTELUKAST (SINGULAIR) 10 MG TABLET    TAKE 1 TABLET BY MOUTH AT BEDTIME   OVER THE COUNTER MEDICATION    1 application daily. BACTRIM CREAM   PAROXETINE (PAXIL) 30 MG TABLET    TAKE 1 TABLET BY MOUTH EVERY DAY   SODIUM CHLORIDE (OCEAN) 0.65 % SOLN NASAL SPRAY    Place 1 spray into both nostrils as needed for congestion.   TRIMETHOPRIM-POLYMYXIN B (POLYTRIM) OPHTHALMIC SOLUTION    INSTILL ONE DROP INTO RIGHT EYE 4 TIMES A DAY FOR 2 DAYS AFTER EACH MONTHLY EYE INJECTION   VITAMIN D, ERGOCALCIFEROL, (DRISDOL) 1.25 MG (50000 UNIT) CAPS CAPSULE    Take 50,000 Units by mouth once a week.  Modified Medications   No medications on file  Discontinued Medications   No medications on file    Physical Exam:  There were no vitals filed for this visit. There is no height or weight on file to calculate BMI. Wt Readings from Last 3 Encounters:  01/24/20 184 lb 8 oz (83.7 kg)  08/23/19 192 lb 3.2 oz (87.2 kg)  07/26/19 191 lb (86.6 kg)    Physical Exam Exam conducted with a chaperone present.  Constitutional:      Appearance: Normal appearance.  Abdominal:     General: Abdomen is flat. Bowel sounds are normal. There is no distension.     Palpations: Abdomen is soft. There is no mass.     Tenderness: There is no abdominal tenderness. There is no guarding or rebound.     Hernia: No hernia is present.    Genitourinary:    Prostate: Normal.     Rectum: Normal.     Comments: Large amount of hard stool noted high in the rectal vault, unable to remove  Skin:    General: Skin is warm and dry.     Comments: Linear abrasion noted to  right forearm, scabbed area, without redness or drainage.   Neurological:     Mental Status: He is alert.     Labs reviewed: Basic Metabolic Panel: Recent Labs    05/26/19 0826 01/24/20 0909  NA 140 140  K 4.5 4.5  CL 106 108  CO2 28 27  GLUCOSE 144* 98  BUN 43* 54*  CREATININE 2.06* 2.64*  CALCIUM 9.6 9.5   Liver Function Tests: Recent Labs    05/26/19 0826  AST 15  ALT 12  BILITOT 0.4  PROT 6.1   No results for input(s): LIPASE, AMYLASE in the last 8760 hours. No results for input(s): AMMONIA in the last 8760 hours. CBC: Recent Labs    05/26/19 0826 01/24/20 0909  WBC 7.6 7.6  NEUTROABS 4,598 4,537  HGB 12.2* 11.9*  HCT 37.8* 36.3*  MCV 97.2 98.9  PLT 163 148   Lipid Panel: Recent Labs    05/26/19 0826  CHOL 176  HDL 43  LDLCALC 107*  TRIG 144  CHOLHDL 4.1   TSH: No results for input(s): TSH in the last 8760 hours. A1C: Lab Results  Component Value Date   HGBA1C 5.9 (H) 01/24/2020     Assessment/Plan 1. Chronic idiopathic constipation -To use fleets enema today- try to hold in as long as possible before having a BM, may repeat tomorrow if needed Start mirlax 17 gm daily for constipation Increase water intake.  Continue stool softener   2. Abrasion Cleaned with saline, pt advised to monitor for infection, redness, drainage.   3. Impacted cerumen of right ear Removed via ear lavage; pt tolerated well   Next appt: 03/06/2020 with Dr Mariea Clonts as scheduled.  Carlos American. [place], [facility] [phone]

## 2020-03-03 NOTE — Patient Instructions (Signed)
To use fleets enema today- try to hold in as long as possible before having a BM, may repeat tomorrow if needed Start mirlax 17 gm daily for constipation Increase water intake.    Constipation, Adult Constipation is when a person has fewer bowel movements in a week than normal, has difficulty having a bowel movement, or has stools that are dry, hard, or larger than normal. Constipation may be caused by an underlying condition. It may become worse with age if a person takes certain medicines and does not take in enough fluids. Follow these instructions at home: Eating and drinking   Eat foods that have a lot of fiber, such as fresh fruits and vegetables, whole grains, and beans.  Limit foods that are high in fat, low in fiber, or overly processed, such as french fries, hamburgers, cookies, candies, and soda.  Drink enough fluid to keep your urine clear or pale yellow. General instructions  Exercise regularly or as told by your health care provider.  Go to the restroom when you have the urge to go. Do not hold it in.  Take over-the-counter and prescription medicines only as told by your health care provider. These include any fiber supplements.  Practice pelvic floor retraining exercises, such as deep breathing while relaxing the lower abdomen and pelvic floor relaxation during bowel movements.  Watch your condition for any changes.  Keep all follow-up visits as told by your health care provider. This is important. Contact a health care provider if:  You have pain that gets worse.  You have a fever.  You do not have a bowel movement after 4 days.  You vomit.  You are not hungry.  You lose weight.  You are bleeding from the anus.  You have thin, pencil-like stools. Get help right away if:  You have a fever and your symptoms suddenly get worse.  You leak stool or have blood in your stool.  Your abdomen is bloated.  You have severe pain in your abdomen.  You feel  dizzy or you faint. This information is not intended to replace advice given to you by your health care provider. Make sure you discuss any questions you have with your health care provider. Document Revised: 06/06/2017 Document Reviewed: 12/13/2015 Elsevier Patient Education  2020 [employer].

## 2020-03-06 ENCOUNTER — Ambulatory Visit (INDEPENDENT_AMBULATORY_CARE_PROVIDER_SITE_OTHER): Payer: PPO | Admitting: Internal Medicine

## 2020-03-06 ENCOUNTER — Encounter: Payer: Self-pay | Admitting: Internal Medicine

## 2020-03-06 ENCOUNTER — Other Ambulatory Visit: Payer: Self-pay

## 2020-03-06 VITALS — BP 138/68 | HR 57 | Temp 97.5°F | Ht 67.0 in | Wt 186.6 lb

## 2020-03-06 DIAGNOSIS — E785 Hyperlipidemia, unspecified: Secondary | ICD-10-CM

## 2020-03-06 DIAGNOSIS — G3184 Mild cognitive impairment, so stated: Secondary | ICD-10-CM

## 2020-03-06 DIAGNOSIS — J449 Chronic obstructive pulmonary disease, unspecified: Secondary | ICD-10-CM

## 2020-03-06 DIAGNOSIS — E1121 Type 2 diabetes mellitus with diabetic nephropathy: Secondary | ICD-10-CM | POA: Diagnosis not present

## 2020-03-06 DIAGNOSIS — N1831 Chronic kidney disease, stage 3a: Secondary | ICD-10-CM | POA: Diagnosis not present

## 2020-03-06 DIAGNOSIS — J4489 Other specified chronic obstructive pulmonary disease: Secondary | ICD-10-CM

## 2020-03-06 DIAGNOSIS — K5904 Chronic idiopathic constipation: Secondary | ICD-10-CM | POA: Diagnosis not present

## 2020-03-06 DIAGNOSIS — E1122 Type 2 diabetes mellitus with diabetic chronic kidney disease: Secondary | ICD-10-CM

## 2020-03-06 DIAGNOSIS — E1159 Type 2 diabetes mellitus with other circulatory complications: Secondary | ICD-10-CM | POA: Diagnosis not present

## 2020-03-06 DIAGNOSIS — I10 Essential (primary) hypertension: Secondary | ICD-10-CM | POA: Diagnosis not present

## 2020-03-06 DIAGNOSIS — E1169 Type 2 diabetes mellitus with other specified complication: Secondary | ICD-10-CM | POA: Diagnosis not present

## 2020-03-06 DIAGNOSIS — I5032 Chronic diastolic (congestive) heart failure: Secondary | ICD-10-CM | POA: Diagnosis not present

## 2020-03-06 DIAGNOSIS — I152 Hypertension secondary to endocrine disorders: Secondary | ICD-10-CM

## 2020-03-06 NOTE — Progress Notes (Signed)
Location:  [facility] clinic Provider:  Korry Dalgleish L. Mariea Clonts, D.O., C.M.D.  Code Status: DNR Goals of Care:  Advanced Directives 03/06/2020  Does Patient Have a Medical Advance Directive? Yes  Type of Paramedic of [place];Out of facility DNR (pink MOST or yellow form)  Does patient want to make changes to medical advance directive? No - Guardian declined  Copy of [place] in Chart? Yes - validated most recent copy scanned in chart (See row information)  Pre-existing out of facility DNR order (yellow form or pink MOST form) Pink MOST/Yellow Form most recent copy in chart - Physician notified to receive inpatient order     Chief Complaint  Patient presents with  . Medical Management of Chronic Issues    routine follow up / go over labs  . Health Maintenance    influenza high dose not in stock     HPI: Patient is a 84 y.o. male seen today for medical management of chronic diseases.    His son asked me to review his kidney labs.  They improved a little bit from last time.  His PTH is up. I need the note from nephrology about whether he started him on calcitriol or is just monitoring.  Calcium was normal.  Vitamin D3 was low at 20.6. He thinks he was taken off losartan.     His concern is he has not been able to have a good bm.  He's trying to eat greens.  He cannot do the fleets enema.  He has a stool softener, but it didn't work.  He got ex-lax.  It didn't help much.    He had his ears cleaned Friday.  He had a ton of wax.  He feels better.    Past Medical History:  Diagnosis Date  . Allergy   . Alzheimer's disease ([place])   . Anemia, unspecified   . Anxiety   . Chronic maxillary sinusitis   . COPD (chronic obstructive pulmonary disease) ([place])   . Depression   . Diabetes mellitus   . Edema   . Hypercalcemia   . Hyperlipidemia   . Hypertension   . Hypertrophy of prostate without urinary obstruction and other lower urinary tract symptoms  (LUTS)   . Hypertrophy of prostate without urinary obstruction and other lower urinary tract symptoms (LUTS)   . Kidney disease    mild  . Unspecified disorder of kidney and ureter   . Unspecified sleep apnea   . Ventral hernia, unspecified, without mention of obstruction or gangrene     Past Surgical History:  Procedure Laterality Date  . EXPLORATORY LAPAROTOMY  2002  . HERNIA REPAIR      Allergies  Allergen Reactions  . Ace Inhibitors Cough    Outpatient Encounter Medications as of 03/06/2020  Medication Sig  . acetaminophen (TYLENOL) 500 MG tablet Take 1,000 mg by mouth every 8 (eight) hours as needed.  Marland Kitchen albuterol (VENTOLIN HFA) 108 (90 Base) MCG/ACT inhaler Inhale 2 puffs into the lungs every 6 (six) hours as needed for wheezing or shortness of breath.  Marland Kitchen amLODipine (NORVASC) 10 MG tablet TAKE 1 TABLET BY MOUTH EVERY DAY  . ASPIRIN LOW DOSE 81 MG EC tablet TAKE 1 TABLET BY MOUTH EVERY DAY  . atorvastatin (LIPITOR) 40 MG tablet TAKE 1 TABLET BY MOUTH EVERY DAY  . BESIVANCE 0.6 % SUSP INSTILL ONE DROP INTO RIGHT EYE 4 TIMES A DAY FOR 2 DAYS AFTER EACH MONTHLY EYE INJECTION  .  blood glucose meter kit and supplies KIT Inject 1 each into the skin 2 (two) times daily. Dispense based on patient and insurance preference. Use up to four times daily as directed. (FOR ICD-9 250.00, 250.01).  . budesonide-formoterol (SYMBICORT) 160-4.5 MCG/ACT inhaler TAKE TWO PUFFS BY MOUTH TWICE DAILY  . diclofenac sodium (VOLTAREN) 1 % GEL Apply 2 g topically 4 (four) times daily as needed (shoulder pain).  . fluticasone (FLONASE) 50 MCG/ACT nasal spray Place 2 sprays into both nostrils daily.  Marland Kitchen glucose blood (ONE TOUCH ULTRA TEST) test strip Use to test blood sugar twice daily.  DX: E11.9  . hydrALAZINE (APRESOLINE) 25 MG tablet TAKE 1 TABLET BY MOUTH TWICE A DAY  . ipratropium-albuterol (DUONEB) 0.5-2.5 (3) MG/3ML SOLN Take 3 mLs by nebulization every 6 (six) hours as needed (wheezing).  Marland Kitchen JANUVIA 25  MG tablet TAKE 1 TABLET BY MOUTH EVERY DAY  . Lancet Devices (ONE TOUCH DELICA LANCING DEV) MISC Use to test blood sugar once daily dx: E119  . Lancets (ONETOUCH ULTRASOFT) lancets Use to test blood sugar up to three times daily. Dx E11.9  . losartan (COZAAR) 25 MG tablet TAKE 1 TABLET BY MOUTH EVERY DAY  . metoprolol tartrate (LOPRESSOR) 50 MG tablet TAKE 1 TABLET BY MOUTH TWICE A DAY  . montelukast (SINGULAIR) 10 MG tablet TAKE 1 TABLET BY MOUTH AT BEDTIME  . OVER THE COUNTER MEDICATION 1 application daily. BACTRIM CREAM  . PARoxetine (PAXIL) 30 MG tablet TAKE 1 TABLET BY MOUTH EVERY DAY  . sodium chloride (OCEAN) 0.65 % SOLN nasal spray Place 1 spray into both nostrils as needed for congestion.  Marland Kitchen trimethoprim-polymyxin b (POLYTRIM) ophthalmic solution INSTILL ONE DROP INTO RIGHT EYE 4 TIMES A DAY FOR 2 DAYS AFTER EACH MONTHLY EYE INJECTION  . Vitamin D, Ergocalciferol, (DRISDOL) 1.25 MG (50000 UNIT) CAPS capsule Take 50,000 Units by mouth once a week.   No facility-administered encounter medications on file as of 03/06/2020.    Review of Systems:  Review of Systems  Constitutional: Negative for chills, fever and malaise/fatigue.  HENT: Positive for hearing loss. Negative for sore throat.   Eyes: Negative for blurred vision.  Respiratory: Negative for cough and shortness of breath.   Cardiovascular: Negative for chest pain, palpitations and leg swelling.  Gastrointestinal: Negative for abdominal pain, blood in stool, constipation and melena.  Genitourinary: Negative for dysuria.  Musculoskeletal: Negative for falls and joint pain.  Skin: Negative for rash.  Neurological: Negative for dizziness and loss of consciousness.  Endo/Heme/Allergies: Bruises/bleeds easily.  Psychiatric/Behavioral: Positive for memory loss. Negative for depression. The patient is not nervous/anxious and does not have insomnia.     Health Maintenance  Topic Date Due  . INFLUENZA VACCINE  02/06/2020  . FOOT  EXAM  05/26/2020  . HEMOGLOBIN A1C  07/26/2020  . OPHTHALMOLOGY EXAM  09/27/2020  . TETANUS/TDAP  08/11/2021  . COVID-19 Vaccine  Completed  . PNA vac Low Risk Adult  Completed    Physical Exam: Vitals:   03/06/20 1522  BP: 138/68  Pulse: (!) 57  Temp: (!) 97.5 F (36.4 C)  TempSrc: Temporal  SpO2: 93%  Weight: 186 lb 9.6 oz (84.6 kg)  Height: _0  (1.702 m)   Body mass index is 29.23 kg/m. Physical Exam Vitals reviewed.  Constitutional:      General: He is not in acute distress.    Appearance: Normal appearance. He is not toxic-appearing.  HENT:     Head: Normocephalic and atraumatic.  Eyes:  Comments: glasses  Cardiovascular:     Rate and Rhythm: Normal rate and regular rhythm.     Pulses: Normal pulses.     Heart sounds: Normal heart sounds.  Pulmonary:     Effort: Pulmonary effort is normal. No respiratory distress.     Breath sounds: Normal breath sounds. No wheezing, rhonchi or rales.  Abdominal:     General: Bowel sounds are normal.  Musculoskeletal:        General: Normal range of motion.     Right lower leg: No edema.     Left lower leg: No edema.  Skin:    General: Skin is warm and dry.  Neurological:     General: No focal deficit present.     Mental Status: He is alert and oriented to person, place, and time. Mental status is at baseline.     Cranial Nerves: No cranial nerve deficit.     Motor: No weakness.  Psychiatric:        Mood and Affect: Mood normal.        Behavior: Behavior normal.     Labs reviewed: Basic Metabolic Panel: Recent Labs    05/26/19 0826 01/24/20 0909  NA 140 140  K 4.5 4.5  CL 106 108  CO2 28 27  GLUCOSE 144* 98  BUN 43* 54*  CREATININE 2.06* 2.64*  CALCIUM 9.6 9.5   Liver Function Tests: Recent Labs    05/26/19 0826  AST 15  ALT 12  BILITOT 0.4  PROT 6.1   No results for input(s): LIPASE, AMYLASE in the last 8760 hours. No results for input(s): AMMONIA in the last 8760 hours. CBC: Recent Labs      05/26/19 0826 01/24/20 0909  WBC 7.6 7.6  NEUTROABS 4,598 4,537  HGB 12.2* 11.9*  HCT 37.8* 36.3*  MCV 97.2 98.9  PLT 163 148   Lipid Panel: Recent Labs    05/26/19 0826  CHOL 176  HDL 43  LDLCALC 107*  TRIG 144  CHOLHDL 4.1   Lab Results  Component Value Date   HGBA1C 5.9 (H) 01/24/2020   Assessment/Plan 1. Chronic kidney disease (CKD) stage G3a/A2, moderately decreased glomerular filtration rate (GFR) between 45-59 mL/min/1.73 square meter and albuminuria creatinine ratio between 30-299 mg/g -kidney function was slightly improved from last time -Avoid nephrotoxic agents like nsaids, dose adjust renally excreted meds, hydrate. -await copy of note from his visit with nephrology to see if hyperparathyroidism was addressed -reportedly, he's off losartan also but need note to confirm and take off list permanently  -counseled about cutting down on his soda and drinking more water  2. Hypertension associated with diabetes ([place]) -bp adequately controlled at this time, cont same regimen  3. Chronic diastolic CHF (congestive heart failure) (HCC) -normovolemic, monitor weight, edema, sob  4. Mild cognitive impairment with memory loss -ongoing, his son is more involved now with keeping track of his health, medications, pt still traveling to [place] to visit his brothers, not getting lost, remains fairly independent  5. Type 2 diabetes mellitus with stage 3a chronic kidney disease, without long-term current use of insulin (HCC) -well controlled, cont januvia and statin Lab Results  Component Value Date   HGBA1C 5.9 (H) 01/24/2020    6. COPD with asthma ([place]) -well controlled at present, no wheezing, not needing albuterol very often  7.  Chronic idiopathic constipation -reminded to take miralax daily unless loose bms develop, then hold until returns to normal and resume again; hydrate well, stay  active, eat plenty of veggies and fiber  Labs/tests ordered:   Lab Orders   No laboratory test(s) ordered today    Next appt:  07/06/2020 with fasting labs before   Venecia Mehl L. Maryam Feely, D.O. [facility] [address], [place] [address] Cell Phone (Mon-Fri 8am-5pm):  [phone] On Call:  [phone] & follow prompts after 5pm & weekends Office Phone:  [phone] Office Fax:  [phone]

## 2020-03-06 NOTE — Patient Instructions (Addendum)
Stay off the pop because it can make your kidneys worse. Keep on drinking water. Take miralax 17 grams in 8oz of water or juice daily.  You may hold off if you get diarrhea, but then start back.

## 2020-03-09 ENCOUNTER — Telehealth: Payer: Self-pay | Admitting: Internal Medicine

## 2020-03-09 NOTE — Telephone Encounter (Signed)
He can come in on the nurse schedule for that.  Ian [name] Patient said she will help him.

## 2020-03-09 NOTE — Telephone Encounter (Signed)
Patient called and stated that he has tried everything that he was told to try for bowel trouble.  He wanted to find out of office would help him with enema.   He wanted me to send this message to find out if he could get help with this.  Please call patient.  Thank you  [employer]

## 2020-03-10 LAB — BASIC METABOLIC PANEL
BUN: 50 — AB (ref 4–21)
CO2: 23 — AB (ref 13–22)
Chloride: 110 — AB (ref 99–108)
Creatinine: 2.8 — AB (ref <=1.3)
Glucose: 123
Potassium: 5.1 (ref 3.4–5.3)
Sodium: 145 (ref 137–147)

## 2020-03-10 LAB — COMPREHENSIVE METABOLIC PANEL
Albumin: 3.6 (ref 3.5–5.0)
Calcium: 9.8 (ref 8.7–10.7)

## 2020-03-14 ENCOUNTER — Encounter: Payer: Self-pay | Admitting: Internal Medicine

## 2020-03-14 DIAGNOSIS — N183 Chronic kidney disease, stage 3 unspecified: Secondary | ICD-10-CM | POA: Diagnosis not present

## 2020-03-16 NOTE — Telephone Encounter (Signed)
Did we do a follow up on this matter

## 2020-03-16 NOTE — Telephone Encounter (Signed)
I thought he was called that day to come in?  I don't see any further documentation.  Please call him and find out.

## 2020-03-17 ENCOUNTER — Telehealth: Payer: Self-pay

## 2020-03-17 NOTE — Telephone Encounter (Signed)
I just spoke with Ian [name] and

## 2020-03-17 NOTE — Telephone Encounter (Signed)
I called the  patient and the patients son I left 2 voicemail messages for someone to return the call so we can do a follow up.

## 2020-03-17 NOTE — Telephone Encounter (Signed)
Mr. Westling called and stated his constipation is better and that he went to Dr. Posey Pronto and would like the results of his kidney test. He also stated he will call them and see if he can get a copy.

## 2020-03-17 NOTE — Telephone Encounter (Signed)
I called Ian [name] he stated that they had stopped the losartan about a week ago, and he made an up coming appointment to come and get his BP re-checked after he come [place] from [place] beach.

## 2020-03-17 NOTE — Telephone Encounter (Signed)
Called patient x 2, no answer, and no voicemail pick up. Phone did not ring, gave an unavailable signal.  Reason for call: Check the status of constipation and offer to schedule a nurse visit or visit with provider.   Charlene please continue to try to reach patient today as this message was sent to you on 03/09/2020(4 business days ago) by Dr.[name] and no follow through has occurred.   Once message was sent to you patient was to be contacted for an appointment.

## 2020-03-17 NOTE — Telephone Encounter (Signed)
I'm not sure if he has gone to Dr. Posey Pronto again since the results we received that I had left for you yesterday where the losartan was stopped?  That was only a short time ago, but before I saw him last.  If only they were in epic.

## 2020-03-21 ENCOUNTER — Other Ambulatory Visit: Payer: Self-pay | Admitting: Nephrology

## 2020-03-21 DIAGNOSIS — N184 Chronic kidney disease, stage 4 (severe): Secondary | ICD-10-CM

## 2020-03-22 NOTE — Telephone Encounter (Signed)
Mr. Espana called back to see if we had any update on his Labs, I called Dr. Posey Pronto [facility]) office an asked if they could send the most recent labs that were done on [place]. I spoke with Hinton [name] and she is sending them via fax. I received the fax,  and his last labs were done on 03/14/2020 they are in your office for your review.

## 2020-03-23 ENCOUNTER — Encounter (INDEPENDENT_AMBULATORY_CARE_PROVIDER_SITE_OTHER): Payer: PPO | Admitting: Ophthalmology

## 2020-03-27 ENCOUNTER — Encounter: Payer: Self-pay | Admitting: Internal Medicine

## 2020-03-27 NOTE — Telephone Encounter (Signed)
Labs  Abstracted from [facility]... Copy sent to patient

## 2020-03-27 NOTE — Telephone Encounter (Signed)
Labs received.  Seems that the [facility] office should be providing pt and his son with these results, not [place].  I'm glad we've gotten them and they can be abstracted into his record.

## 2020-03-28 ENCOUNTER — Ambulatory Visit
Admission: RE | Admit: 2020-03-28 | Discharge: 2020-03-28 | Disposition: A | Discharge status: Home / Self Care | Payer: PPO | Source: Ambulatory Visit | Attending: Nephrology | Admitting: Nephrology

## 2020-03-28 DIAGNOSIS — N184 Chronic kidney disease, stage 4 (severe): Secondary | ICD-10-CM

## 2020-03-28 DIAGNOSIS — N281 Cyst of kidney, acquired: Secondary | ICD-10-CM | POA: Diagnosis not present

## 2020-03-28 DIAGNOSIS — R188 Other ascites: Secondary | ICD-10-CM | POA: Diagnosis not present

## 2020-03-28 DIAGNOSIS — N400 Enlarged prostate without lower urinary tract symptoms: Secondary | ICD-10-CM | POA: Diagnosis not present

## 2020-03-30 ENCOUNTER — Encounter: Payer: Self-pay | Admitting: Nurse Practitioner

## 2020-03-30 ENCOUNTER — Ambulatory Visit (INDEPENDENT_AMBULATORY_CARE_PROVIDER_SITE_OTHER): Payer: PPO | Admitting: Nurse Practitioner

## 2020-03-30 ENCOUNTER — Other Ambulatory Visit: Payer: Self-pay

## 2020-03-30 VITALS — BP 130/80 | HR 62 | Temp 98.0°F | Ht 67.0 in | Wt 188.0 lb

## 2020-03-30 DIAGNOSIS — Z23 Encounter for immunization: Secondary | ICD-10-CM | POA: Diagnosis not present

## 2020-03-30 DIAGNOSIS — R6889 Other general symptoms and signs: Secondary | ICD-10-CM | POA: Diagnosis not present

## 2020-03-30 DIAGNOSIS — I10 Essential (primary) hypertension: Secondary | ICD-10-CM | POA: Diagnosis not present

## 2020-03-30 DIAGNOSIS — K5904 Chronic idiopathic constipation: Secondary | ICD-10-CM | POA: Diagnosis not present

## 2020-03-30 NOTE — Progress Notes (Signed)
Careteam: Patient Care Team: Gayland Curry, DO as PCP - General (Geriatric Medicine) Zebedee Iba., MD as Referring Physician (Ophthalmology) Chesley Mires, MD as Consulting Physician (Pulmonary Disease)  PLACE OF SERVICE:  [place] Directive information    Allergies  Allergen Reactions   Ace Inhibitors Cough    Chief Complaint  Patient presents with   Acute Visit    Blood Pressure Check for per Dr. [name]/ discuss medication for sleep     Health Maintenance    Influenza (does not want today)     HPI: Patient is a 84 y.o. male for follow up on blood pressure. His losartan was stopped by nephrology per telephone notes.  Taking norvasc, metoprolol and hydralazine for blood pressure. Reports he ate [place] food and honey bun yesterday for dinner. Reports blood pressure has been good at home but did not bring his blood pressure log. Reports his sbp ranging from 118-130 at home. Nephrology ordered US kidneys and stopped losartan. Reports he will have a follow up with nephrologist but unsure when  Reports that overall he does sleep well but has bad dreams and that effects him. Reports they are not really "bad" but vivid.  Reports sleeps pretty good and then he said I really dont need another pill.      Review of Systems:  Review of Systems  Constitutional: Negative for chills, fever and weight loss.  HENT: Negative for tinnitus.   Respiratory: Negative for cough, sputum production and shortness of breath.   Cardiovascular: Negative for chest pain, palpitations and leg swelling.  Gastrointestinal: Negative for abdominal pain, constipation, diarrhea and heartburn.  Genitourinary: Negative for dysuria, frequency and urgency.  Musculoskeletal: Negative for back pain, falls, joint pain and myalgias.  Skin: Negative.   Neurological: Negative for dizziness and headaches.  Psychiatric/Behavioral: Negative for depression and memory loss. The patient does not have  insomnia.     Past Medical History:  Diagnosis Date   Allergy    Alzheimer's disease ([place])    Anemia, unspecified    Anxiety    Chronic maxillary sinusitis    COPD (chronic obstructive pulmonary disease) ([place])    Depression    Diabetes mellitus    Edema    Hypercalcemia    Hyperlipidemia    Hypertension    Hypertrophy of prostate without urinary obstruction and other lower urinary tract symptoms (LUTS)    Hypertrophy of prostate without urinary obstruction and other lower urinary tract symptoms (LUTS)    Kidney disease    mild   Unspecified disorder of kidney and ureter    Unspecified sleep apnea    Ventral hernia, unspecified, without mention of obstruction or gangrene    Past Surgical History:  Procedure Laterality Date   EXPLORATORY LAPAROTOMY  2002   HERNIA REPAIR     Social History:   reports that he quit smoking about 31 years ago. His smoking use included cigarettes. He has a 45.00 pack-year smoking history. He has never used smokeless tobacco. He reports that he does not drink alcohol and does not use drugs.  Family History  Problem Relation Age of Onset   Pancreatic cancer Sister    Heart disease Father    Hypertension Brother    Hypertension Sister     Medications: Patient's Medications  New Prescriptions   No medications on file  Previous Medications   ACETAMINOPHEN (TYLENOL) 500 MG TABLET    Take 1,000 mg by mouth every 8 (eight) hours as  needed.   ALBUTEROL (VENTOLIN HFA) 108 (90 BASE) MCG/ACT INHALER    Inhale 2 puffs into the lungs every 6 (six) hours as needed for wheezing or shortness of breath.   AMLODIPINE (NORVASC) 10 MG TABLET    TAKE 1 TABLET BY MOUTH EVERY DAY   ASPIRIN LOW DOSE 81 MG EC TABLET    TAKE 1 TABLET BY MOUTH EVERY DAY   ATORVASTATIN (LIPITOR) 40 MG TABLET    TAKE 1 TABLET BY MOUTH EVERY DAY   BESIVANCE 0.6 % SUSP    INSTILL ONE DROP INTO RIGHT EYE 4 TIMES A DAY FOR 2 DAYS AFTER EACH MONTHLY EYE INJECTION    BLOOD GLUCOSE METER KIT AND SUPPLIES KIT    Inject 1 each into the skin 2 (two) times daily. Dispense based on patient and insurance preference. Use up to four times daily as directed. (FOR ICD-9 250.00, 250.01).   BUDESONIDE-FORMOTEROL (SYMBICORT) 160-4.5 MCG/ACT INHALER    TAKE TWO PUFFS BY MOUTH TWICE DAILY   DICLOFENAC SODIUM (VOLTAREN) 1 % GEL    Apply 2 g topically 4 (four) times daily as needed (shoulder pain).   FLUTICASONE (FLONASE) 50 MCG/ACT NASAL SPRAY    Place 2 sprays into both nostrils daily.   GLUCOSE BLOOD (ONE TOUCH ULTRA TEST) TEST STRIP    Use to test blood sugar twice daily.  DX: E11.9   HYDRALAZINE (APRESOLINE) 25 MG TABLET    TAKE 1 TABLET BY MOUTH TWICE A DAY   IPRATROPIUM-ALBUTEROL (DUONEB) 0.5-2.5 (3) MG/3ML SOLN    Take 3 mLs by nebulization every 6 (six) hours as needed (wheezing).   JANUVIA 25 MG TABLET    TAKE 1 TABLET BY MOUTH EVERY DAY   LANCET DEVICES (ONE TOUCH DELICA LANCING DEV) MISC    Use to test blood sugar once daily dx: E119   LANCETS (ONETOUCH ULTRASOFT) LANCETS    Use to test blood sugar up to three times daily. Dx E11.9   LOSARTAN (COZAAR) 25 MG TABLET    TAKE 1 TABLET BY MOUTH EVERY DAY   METOPROLOL TARTRATE (LOPRESSOR) 50 MG TABLET    TAKE 1 TABLET BY MOUTH TWICE A DAY   MONTELUKAST (SINGULAIR) 10 MG TABLET    TAKE 1 TABLET BY MOUTH AT BEDTIME   OVER THE COUNTER MEDICATION    1 application daily. BACTRIM CREAM   PAROXETINE (PAXIL) 30 MG TABLET    TAKE 1 TABLET BY MOUTH EVERY DAY   SODIUM CHLORIDE (OCEAN) 0.65 % SOLN NASAL SPRAY    Place 1 spray into both nostrils as needed for congestion.   TRIMETHOPRIM-POLYMYXIN B (POLYTRIM) OPHTHALMIC SOLUTION    INSTILL ONE DROP INTO RIGHT EYE 4 TIMES A DAY FOR 2 DAYS AFTER EACH MONTHLY EYE INJECTION   VITAMIN D, ERGOCALCIFEROL, (DRISDOL) 1.25 MG (50000 UNIT) CAPS CAPSULE    Take 50,000 Units by mouth once a week.  Modified Medications   No medications on file  Discontinued Medications   No medications on file     Physical Exam:  Vitals:   03/30/20 0857 03/30/20 0940  BP: (!) 142/72 130/80  Pulse: 62   Temp: 98 F (36.7 C)   TempSrc: Temporal   SpO2: 97%   Weight: 188 lb (85.3 kg)   Height: 5' 7"  (1.702 m)    Body mass index is 29.44 kg/m. Wt Readings from Last 3 Encounters:  03/30/20 188 lb (85.3 kg)  03/06/20 186 lb 9.6 oz (84.6 kg)  01/24/20 184 lb 8 oz (83.7 kg)  Physical Exam Constitutional:      General: He is not in acute distress.    Appearance: He is well-developed. He is not diaphoretic.  HENT:     Head: Normocephalic and atraumatic.     Mouth/Throat:     Pharynx: No oropharyngeal exudate.  Eyes:     Conjunctiva/sclera: Conjunctivae normal.     Pupils: Pupils are equal, round, and reactive to light.  Cardiovascular:     Rate and Rhythm: Normal rate and regular rhythm.     Heart sounds: Normal heart sounds.  Pulmonary:     Effort: Pulmonary effort is normal.     Breath sounds: Normal breath sounds.  Abdominal:     General: Bowel sounds are normal.     Palpations: Abdomen is soft.  Musculoskeletal:        General: No tenderness.     Cervical back: Normal range of motion and neck supple.  Skin:    General: Skin is warm and dry.  Neurological:     Mental Status: He is alert and oriented to person, place, and time.     Labs reviewed: Basic Metabolic Panel: Recent Labs    05/26/19 0826 01/24/20 0909 03/10/20 0000  NA 140 140 145  K 4.5 4.5 5.1  CL 106 108 110*  CO2 28 27 23*  GLUCOSE 144* 98  --   BUN 43* 54* 50*  CREATININE 2.06* 2.64* 2.8*  CALCIUM 9.6 9.5 9.8   Liver Function Tests: Recent Labs    05/26/19 0826 03/10/20 0000  AST 15  --   ALT 12  --   BILITOT 0.4  --   PROT 6.1  --   ALBUMIN  --  3.6   No results for input(s): LIPASE, AMYLASE in the last 8760 hours. No results for input(s): AMMONIA in the last 8760 hours. CBC: Recent Labs    05/26/19 0826 01/24/20 0909  WBC 7.6 7.6  NEUTROABS 4,598 4,537  HGB 12.2* 11.9*   HCT 37.8* 36.3*  MCV 97.2 98.9  PLT 163 148   Lipid Panel: Recent Labs    05/26/19 0826  CHOL 176  HDL 43  LDLCALC 107*  TRIG 144  CHOLHDL 4.1   TSH: No results for input(s): TSH in the last 8760 hours. A1C: Lab Results  Component Value Date   HGBA1C 5.9 (H) 01/24/2020     Assessment/Plan 1. Essential hypertension Improved on recheck.  Will continue current regimen for blood pressure. Encouraged dietary modifications.   2. Chronic idiopathic constipation Improved at this time  3. Vivid dream Sleeping well with vivid dream. Encouraged to take paxil during the day to see if this improves.   4. Need for influenza vaccination - Flu Vaccine QUAD High Dose(Fluad)  Next appt: 07/03/2020 as scheduled with Dr Sharee Holster K. [place], [facility] [phone]

## 2020-03-31 DIAGNOSIS — N184 Chronic kidney disease, stage 4 (severe): Secondary | ICD-10-CM | POA: Diagnosis not present

## 2020-04-05 ENCOUNTER — Encounter (INDEPENDENT_AMBULATORY_CARE_PROVIDER_SITE_OTHER): Payer: PPO | Admitting: Ophthalmology

## 2020-04-05 ENCOUNTER — Telehealth: Payer: Self-pay | Admitting: *Deleted

## 2020-04-05 NOTE — Telephone Encounter (Signed)
[name], son, notified and agreed.

## 2020-04-05 NOTE — Telephone Encounter (Signed)
[name], Son, called and wanted to know if you recommend patient taking the Covid Booster Injection. Request your opinion.  Please Advise.

## 2020-04-05 NOTE — Telephone Encounter (Signed)
So far, only the pfizer booster shot has been recommended by CDC for those who had their 2 pfizer vaccines.  Because Ian [name] has not yet been approved as a booster by FDA, there is no current recommendation for those who took that vaccine to get the booster YET.  If the guidelines change, he is eligible for a booster 6 mos from his second moderna vaccine.

## 2020-04-06 ENCOUNTER — Encounter: Payer: Self-pay | Admitting: Dermatology

## 2020-04-06 ENCOUNTER — Ambulatory Visit (INDEPENDENT_AMBULATORY_CARE_PROVIDER_SITE_OTHER): Payer: PPO | Admitting: Dermatology

## 2020-04-06 ENCOUNTER — Other Ambulatory Visit: Payer: Self-pay

## 2020-04-06 DIAGNOSIS — L309 Dermatitis, unspecified: Secondary | ICD-10-CM

## 2020-04-06 DIAGNOSIS — D692 Other nonthrombocytopenic purpura: Secondary | ICD-10-CM | POA: Diagnosis not present

## 2020-04-06 DIAGNOSIS — Z1283 Encounter for screening for malignant neoplasm of skin: Secondary | ICD-10-CM | POA: Diagnosis not present

## 2020-04-06 DIAGNOSIS — L918 Other hypertrophic disorders of the skin: Secondary | ICD-10-CM | POA: Diagnosis not present

## 2020-04-06 DIAGNOSIS — L729 Follicular cyst of the skin and subcutaneous tissue, unspecified: Secondary | ICD-10-CM

## 2020-04-06 DIAGNOSIS — L821 Other seborrheic keratosis: Secondary | ICD-10-CM

## 2020-04-06 MED ORDER — BETAMETHASONE DIPROPIONATE 0.05 % EX CREA: TOPICAL_CREAM | CUTANEOUS | 0 refills | Status: DC

## 2020-04-06 NOTE — Patient Instructions (Addendum)
Several issues discussed with my old friend Ian [name] date of birth 1936/01/20.  Of chief concern is a 1 month history of a very itchy patch on his left outer mid to lower back.  Examination showed bite-like areas of subacute dermatitis without blisters.  I am not 444% certain what the cause of this is, but I feel it is unlikely to be anything contagious or an infection.  We wrote a prescription for a cream (betamethasone diprionate) which hopefully will have a generic co-pay which we would like you to apply to this area once daily after bathing for 2 to 4 weeks.  Please contact my office at that time to let me know how you are doing.  If there is no improvement, we may have you return to do some simple testing.  The second issue discussed was the fragile skin and easy bruising from his elbow down to his hand which is a condition called solar purpura.  Although there is no true cure, a nonprescription cream called Dermend may be helpful if applied daily after bathing.  This can be gotten at [employer] or [place] or on [employer].  Finally I did do skin check of all his assorted lumps and bumps from the waist up and there are no atypical moles or skin cancer that need to be removed.

## 2020-04-12 ENCOUNTER — Other Ambulatory Visit: Payer: Self-pay

## 2020-04-12 NOTE — Telephone Encounter (Signed)
Patient called requesting a refill on Symbicort.  I informed patient that he should have refills on file at [facility] according to our records. Patient plans to call [facility]

## 2020-04-26 ENCOUNTER — Encounter (INDEPENDENT_AMBULATORY_CARE_PROVIDER_SITE_OTHER): Payer: PPO | Admitting: Ophthalmology

## 2020-04-28 ENCOUNTER — Other Ambulatory Visit: Payer: Self-pay | Admitting: Internal Medicine

## 2020-04-28 DIAGNOSIS — E1122 Type 2 diabetes mellitus with diabetic chronic kidney disease: Secondary | ICD-10-CM

## 2020-04-28 DIAGNOSIS — F418 Other specified anxiety disorders: Secondary | ICD-10-CM

## 2020-04-28 NOTE — Telephone Encounter (Signed)
Patient has request refill on medications "Asprin, Paroxetine, and Montelukast". Medications Asprin and Montelukast are too soon to be refilled. Paroxetine can be refilled but medication has 2 warnings. Medications pend and sent to provider Mariea Clonts, [name] L, DO . Please Advise.

## 2020-05-11 NOTE — Progress Notes (Signed)
   New Patient   Subjective  Ian [name] is a 84 y.o. male who presents for the following: Skin Problem (left back itch x month treatment = otc hydrocortisone he thinks, brother also gave him a cream dont know the name yesterday).  rash Location: left back Duration:  Quality:  Associated Signs/Symptoms: Modifying Factors:  Severity:  Timing: Context:    The following portions of the chart were reviewed this encounter and updated as appropriate: Tobacco  Allergies  Meds  Problems  Med Hx  Surg Hx  Fam Hx      Objective  Well appearing patient in no apparent distress; mood and affect are within normal limits.  All skin waist up examined.    Several month history of very itchy rash limited to the left outer mid to lower back.  Examination showed bite-like patches of subacute dermatitis without vesicles (there is a distant history of shingles on the right lower back buttocks area).  No other areas of involvement.  I am uncertain what triggered this but I do not believe it is an infectious process.  Prescription for augmented betamethasone diprionate which have asked Ian [name] to apply to this area daily after bathing for the next 2 to 4 weeks.  If he does not clear, we may proceed to obtain biopsies to exclude rare localized inflammatory disorders.  The rest of his skin examination from waist up showed no atypical moles or melanoma or skin cancer.  He is prone to get keratoses, skin tags, and particularly around the eyes some darkly pigmented small skin cysts.  None of these require intervention.  Several issues discussed with my old friend Chanceler Pullin date of birth [birth year].  Of chief concern is a 1 month history of a very itchy patch on his left outer mid to lower back.  Examination showed bite-like areas of subacute dermatitis without blisters.  I am not 716% certain what the cause of this is, but I feel it is unlikely to be anything contagious or an infection.  We wrote a  prescription for a cream which hopefully will have a generic co-pay which we would like you to apply to this area once daily after bathing for 2 to 4 weeks.  Please contact my office at that time to let me know how you are doing.  If there is no improvement, we may have you return to do some simple testing.  The second issue discussed was the fragile skin and easy bruising from his elbow down to his hand which is a condition called solar purpura.  Although there is no true cure, a nonprescription cream called Dermend may be helpful if applied daily after bathing.  This can be gotten at [employer] or [place] or on [employer].  Finally I did do skin check of all his assorted lumps and bumps from the waist up and there are no atypical moles or skin cancer that need to be removed.  Assessment & Plan  Dermatitis Left Lower Back  betamethasone dipropionate 0.05 % cream - Left Lower Back  Encounter for screening for malignant neoplasm of skin Mid Back  Yearly skin exams.   Seborrheic keratosis (2) Left Upper Eyelid; Right Zygomatic Area  No treatment needed. Benign lesions.   Skin tag (2) Left Malar Cheek; Right Malar Cheek  Benign no treatment needed.   Solar purpura ([place]) (2) Left Forearm - Anterior; Right Forearm - Anterior  OTC dermend.

## 2020-05-13 ENCOUNTER — Encounter: Payer: Self-pay | Admitting: Dermatology

## 2020-05-21 ENCOUNTER — Other Ambulatory Visit: Payer: Self-pay | Admitting: Internal Medicine

## 2020-05-21 DIAGNOSIS — E1122 Type 2 diabetes mellitus with diabetic chronic kidney disease: Secondary | ICD-10-CM

## 2020-05-22 ENCOUNTER — Other Ambulatory Visit: Payer: Self-pay | Admitting: Internal Medicine

## 2020-05-23 ENCOUNTER — Other Ambulatory Visit: Payer: Self-pay

## 2020-05-23 ENCOUNTER — Other Ambulatory Visit: Payer: PPO

## 2020-05-23 DIAGNOSIS — E1122 Type 2 diabetes mellitus with diabetic chronic kidney disease: Secondary | ICD-10-CM

## 2020-05-23 DIAGNOSIS — N1831 Chronic kidney disease, stage 3a: Secondary | ICD-10-CM | POA: Diagnosis not present

## 2020-05-23 DIAGNOSIS — I152 Hypertension secondary to endocrine disorders: Secondary | ICD-10-CM

## 2020-05-23 DIAGNOSIS — E1159 Type 2 diabetes mellitus with other circulatory complications: Secondary | ICD-10-CM | POA: Diagnosis not present

## 2020-05-24 ENCOUNTER — Other Ambulatory Visit: Payer: Self-pay | Admitting: *Deleted

## 2020-05-24 LAB — LIPID PANEL
Cholesterol: 173 mg/dL (ref <200)
HDL: 42 mg/dL (ref >=40)
LDL Cholesterol (Calc): 103 mg/dL (calc) — ABNORMAL HIGH
Non-HDL Cholesterol (Calc): 131 mg/dL (calc) — ABNORMAL HIGH (ref <130)
Total CHOL/HDL Ratio: 4.1 (calc) (ref <5.0)
Triglycerides: 165 mg/dL — ABNORMAL HIGH (ref <150)

## 2020-05-24 LAB — BASIC METABOLIC PANEL WITH GFR
BUN/Creatinine Ratio: 18 (calc) (ref 6–22)
BUN: 53 mg/dL — ABNORMAL HIGH (ref 7–25)
CO2: 25 mmol/L (ref 20–32)
Calcium: 9.8 mg/dL (ref 8.6–10.3)
Chloride: 106 mmol/L (ref 98–110)
Creat: 2.9 mg/dL — ABNORMAL HIGH (ref 0.70–1.11)
GFR, Est African American: 22 mL/min/{1.73_m2} — ABNORMAL LOW (ref >=60)
GFR, Est Non African American: 19 mL/min/{1.73_m2} — ABNORMAL LOW (ref >=60)
Glucose, Bld: 120 mg/dL — ABNORMAL HIGH (ref 65–99)
Potassium: 4.5 mmol/L (ref 3.5–5.3)
Sodium: 140 mmol/L (ref 135–146)

## 2020-05-24 LAB — HEMOGLOBIN A1C
Hgb A1c MFr Bld: 6 % of total Hgb — ABNORMAL HIGH (ref <5.7)
Mean Plasma Glucose: 126 (calc)
eAG (mmol/L): 7 (calc)

## 2020-05-24 MED ORDER — ATORVASTATIN CALCIUM 80 MG PO TABS
80.0000 mg | ORAL_TABLET | Freq: Every day | ORAL | 1 refills | Status: DC
Start: 2020-05-24 — End: 2021-01-16

## 2020-05-24 NOTE — Progress Notes (Signed)
Notify his son:  Kidney function continues to very gradually decline.  He follows with the kidney specialist. Sugar average trended up slightly but remains good. Triglycerides (starchy fats) have gone up and bad cholesterol remains above goal.  If he can tolerate going up to 80mg  of lipitor, I recommend we do that so he could at least be at goal since he's taking the medication.

## 2020-05-24 NOTE — Telephone Encounter (Signed)
Patient son was returning call for [facility] Results.  Notified and agreed.  Medication list updated and Rx sent [facility] for pillpak.     Gayland Curry, DO  05/24/2020 6:02 AM EST     Notify his son: Kidney function continues to very gradually decline. He follows with the kidney specialist. Sugar average trended up slightly but remains good. Triglycerides (starchy fats) have gone up and bad cholesterol remains above goal. If he can tolerate going up to 80mg  of lipitor, I recommend we do that so he could at least be at goal since he's taking the medication.

## 2020-05-25 ENCOUNTER — Encounter: Payer: Self-pay | Admitting: Internal Medicine

## 2020-05-25 ENCOUNTER — Other Ambulatory Visit: Payer: Self-pay

## 2020-05-25 ENCOUNTER — Ambulatory Visit: Payer: PPO | Admitting: Internal Medicine

## 2020-05-25 ENCOUNTER — Ambulatory Visit (INDEPENDENT_AMBULATORY_CARE_PROVIDER_SITE_OTHER): Payer: PPO | Admitting: Internal Medicine

## 2020-05-25 VITALS — BP 120/60 | HR 60 | Temp 97.8°F | Ht 67.0 in | Wt 187.6 lb

## 2020-05-25 DIAGNOSIS — E1169 Type 2 diabetes mellitus with other specified complication: Secondary | ICD-10-CM

## 2020-05-25 DIAGNOSIS — J4489 Other specified chronic obstructive pulmonary disease: Secondary | ICD-10-CM

## 2020-05-25 DIAGNOSIS — G3184 Mild cognitive impairment, so stated: Secondary | ICD-10-CM

## 2020-05-25 DIAGNOSIS — E1122 Type 2 diabetes mellitus with diabetic chronic kidney disease: Secondary | ICD-10-CM

## 2020-05-25 DIAGNOSIS — J449 Chronic obstructive pulmonary disease, unspecified: Secondary | ICD-10-CM

## 2020-05-25 DIAGNOSIS — I10 Essential (primary) hypertension: Secondary | ICD-10-CM

## 2020-05-25 DIAGNOSIS — E785 Hyperlipidemia, unspecified: Secondary | ICD-10-CM | POA: Diagnosis not present

## 2020-05-25 DIAGNOSIS — N1831 Chronic kidney disease, stage 3a: Secondary | ICD-10-CM

## 2020-05-25 MED ORDER — BUDESONIDE-FORMOTEROL FUMARATE 160-4.5 MCG/ACT IN AERO: INHALATION_SPRAY | RESPIRATORY_TRACT | 5 refills | Status: DC

## 2020-05-25 NOTE — Progress Notes (Signed)
Location:  [facility] clinic Provider:  Niaya Hickok L. Mariea Clonts, D.O., C.M.D.  Code Status: DNR Goals of Care:  Advanced Directives 05/25/2020  Does Patient Have a Medical Advance Directive? No  Type of Advance Directive -  Does patient want to make changes to medical advance directive? -  Copy of [place] in Chart? -  Would patient like information on creating a medical advance directive? No - Patient declined  Pre-existing out of facility DNR order (yellow form or pink MOST form) -     Chief Complaint  Patient presents with  . Medical Management of Chronic Issues    4 month follow up     HPI: Patient is a 84 y.o. male seen today for medical management of chronic diseases.    He is doing well without complaints.  He's planning to attend a gathering of 50 people at thanksgiving so I emphasized wearing his mask.  He's had his covid vaccines and booster, as well as his flu shot. Unfortunately, there may be some unvaccinated relatives there who don't believe in modern medical interventions (pt expresses his frustration with this).    We reviewed his labs with some slight decrease in kidney function.  Discouraged soda and encouraged water.  Also has been eating some other sweets and unhealthy items that led to increased hba1c.  He is still going to the gym regularly.    He denies pain.  He's eating and drinking well.  He's had no problems moving his bowels or urinating.  He sleeps well.  He thinks about his wife often and will sometimes be sad b/c he misses her but he seems to be coping normally.  He stays active and busy.    Past Medical History:  Diagnosis Date  . Allergy   . Alzheimer's disease ([place])   . Anemia, unspecified   . Anxiety   . Chronic maxillary sinusitis   . COPD (chronic obstructive pulmonary disease) ([place])   . Depression   . Diabetes mellitus   . Edema   . Hypercalcemia   . Hyperlipidemia   . Hypertension   . Hypertrophy of prostate without urinary  obstruction and other lower urinary tract symptoms (LUTS)   . Hypertrophy of prostate without urinary obstruction and other lower urinary tract symptoms (LUTS)   . Kidney disease    mild  . Unspecified disorder of kidney and ureter   . Unspecified sleep apnea   . Ventral hernia, unspecified, without mention of obstruction or gangrene     Past Surgical History:  Procedure Laterality Date  . EXPLORATORY LAPAROTOMY  2002  . HERNIA REPAIR      Allergies  Allergen Reactions  . Ace Inhibitors Cough    Outpatient Encounter Medications as of 05/25/2020  Medication Sig  . acetaminophen (TYLENOL) 500 MG tablet Take 1,000 mg by mouth every 8 (eight) hours as needed.  Marland Kitchen albuterol (VENTOLIN HFA) 108 (90 Base) MCG/ACT inhaler Inhale 2 puffs into the lungs every 6 (six) hours as needed for wheezing or shortness of breath.  Marland Kitchen amLODipine (NORVASC) 10 MG tablet TAKE 1 TABLET BY MOUTH EVERY DAY  . ASPIRIN LOW DOSE 81 MG EC tablet TAKE 1 TABLET BY MOUTH EVERY DAY  . atorvastatin (LIPITOR) 80 MG tablet Take 1 tablet (80 mg total) by mouth daily.  Marland Kitchen BESIVANCE 0.6 % SUSP INSTILL ONE DROP INTO RIGHT EYE 4 TIMES A DAY FOR 2 DAYS AFTER EACH MONTHLY EYE INJECTION  . betamethasone dipropionate 0.05 % cream Apply  topically to affected area after bathing daily for the next 30 days  . blood glucose meter kit and supplies KIT Inject 1 each into the skin 2 (two) times daily. Dispense based on patient and insurance preference. Use up to four times daily as directed. (FOR ICD-9 250.00, 250.01).  . budesonide-formoterol (SYMBICORT) 160-4.5 MCG/ACT inhaler TAKE TWO PUFFS BY MOUTH TWICE DAILY  . diclofenac sodium (VOLTAREN) 1 % GEL Apply 2 g topically 4 (four) times daily as needed (shoulder pain).  . fluticasone (FLONASE) 50 MCG/ACT nasal spray Place 2 sprays into both nostrils daily.  Marland Kitchen glucose blood (ONE TOUCH ULTRA TEST) test strip Use to test blood sugar twice daily.  DX: E11.9  . hydrALAZINE (APRESOLINE) 25 MG  tablet TAKE 1 TABLET BY MOUTH TWICE A DAY  . ipratropium-albuterol (DUONEB) 0.5-2.5 (3) MG/3ML SOLN Take 3 mLs by nebulization every 6 (six) hours as needed (wheezing).  Marland Kitchen JANUVIA 25 MG tablet TAKE 1 TABLET BY MOUTH EVERY DAY  . Lancet Devices (ONE TOUCH DELICA LANCING DEV) MISC Use to test blood sugar once daily dx: E119  . Lancets (ONETOUCH ULTRASOFT) lancets Use to test blood sugar up to three times daily. Dx E11.9  . metoprolol tartrate (LOPRESSOR) 50 MG tablet TAKE 1 TABLET BY MOUTH TWICE A DAY  . montelukast (SINGULAIR) 10 MG tablet TAKE 1 TABLET BY MOUTH AT BEDTIME  . OVER THE COUNTER MEDICATION 1 application daily. BACTRIM CREAM  . PARoxetine (PAXIL) 30 MG tablet TAKE 1 TABLET BY MOUTH EVERY DAY  . sodium chloride (OCEAN) 0.65 % SOLN nasal spray Place 1 spray into both nostrils as needed for congestion.  Marland Kitchen trimethoprim-polymyxin b (POLYTRIM) ophthalmic solution INSTILL ONE DROP INTO RIGHT EYE 4 TIMES A DAY FOR 2 DAYS AFTER EACH MONTHLY EYE INJECTION  . Vitamin D, Ergocalciferol, (DRISDOL) 1.25 MG (50000 UNIT) CAPS capsule Take 50,000 Units by mouth once a week.   No facility-administered encounter medications on file as of 05/25/2020.    Review of Systems:  Review of Systems  Constitutional: Negative for chills, fever and malaise/fatigue.  HENT: Negative for congestion and sore throat.   Eyes: Negative for blurred vision.       Glasses  Respiratory: Negative for cough, sputum production, shortness of breath and wheezing.   Cardiovascular: Negative for chest pain, palpitations and leg swelling.  Gastrointestinal: Negative for abdominal pain and constipation.  Genitourinary: Negative for dysuria.  Musculoskeletal: Negative for falls and joint pain.  Neurological: Negative for dizziness and loss of consciousness.  Endo/Heme/Allergies: Bruises/bleeds easily.  Psychiatric/Behavioral: Positive for memory loss. Negative for depression. The patient is not nervous/anxious and does not  have insomnia.     Health Maintenance  Topic Date Due  . FOOT EXAM  05/26/2020  . OPHTHALMOLOGY EXAM  09/27/2020  . HEMOGLOBIN A1C  11/20/2020  . TETANUS/TDAP  08/11/2021  . INFLUENZA VACCINE  Completed  . COVID-19 Vaccine  Completed  . PNA vac Low Risk Adult  Completed    Physical Exam: Vitals:   05/25/20 0947  BP: 120/60  Pulse: 60  Temp: 97.8 F (36.6 C)  TempSrc: Temporal  SpO2: 94%  Weight: 187 lb 9.6 oz (85.1 kg)  Height: 5' 7"  (1.702 m)   Body mass index is 29.38 kg/m. Physical Exam Vitals reviewed.  Constitutional:      General: He is not in acute distress.    Appearance: Normal appearance. He is obese. He is not toxic-appearing.  HENT:     Head: Normocephalic and atraumatic.  Ears:     Comments: HOH Eyes:     Comments: glasses  Cardiovascular:     Rate and Rhythm: Normal rate and regular rhythm.     Pulses: Normal pulses.     Heart sounds: Normal heart sounds.  Pulmonary:     Effort: Pulmonary effort is normal.     Breath sounds: Rhonchi present.     Comments: baseline Abdominal:     General: Bowel sounds are normal. There is no distension.     Palpations: Abdomen is soft.     Tenderness: There is no abdominal tenderness.  Musculoskeletal:     Cervical back: Neck supple.  Lymphadenopathy:     Cervical: No cervical adenopathy.  Skin:    General: Skin is warm and dry.  Neurological:     General: No focal deficit present.     Mental Status: He is alert and oriented to person, place, and time.     Motor: No weakness.     Gait: Gait normal.     Comments: Some short-term memory loss   Psychiatric:        Mood and Affect: Mood normal.        Behavior: Behavior normal.     Comments: Pleasant and sociable     Labs reviewed: Basic Metabolic Panel: Recent Labs    01/24/20 0909 03/10/20 0000 05/23/20 0821  NA 140 145 140  K 4.5 5.1 4.5  CL 108 110* 106  CO2 27 23* 25  GLUCOSE 98  --  120*  BUN 54* 50* 53*  CREATININE 2.64* 2.8* 2.90*    CALCIUM 9.5 9.8 9.8   Liver Function Tests: Recent Labs    03/10/20 0000  ALBUMIN 3.6   No results for input(s): LIPASE, AMYLASE in the last 8760 hours. No results for input(s): AMMONIA in the last 8760 hours. CBC: Recent Labs    01/24/20 0909  WBC 7.6  NEUTROABS 4,537  HGB 11.9*  HCT 36.3*  MCV 98.9  PLT 148   Lipid Panel: Recent Labs    05/23/20 0821  CHOL 173  HDL 42  LDLCALC 103*  TRIG 165*  CHOLHDL 4.1   Lab Results  Component Value Date   HGBA1C 6.0 (H) 05/23/2020    Assessment/Plan 1. COPD with asthma ([place]) -stable w/o exacerbation -cont symbicort, prn duonebs, singulair and prn albuterol HFA  2. Hyperlipidemia associated with type 2 diabetes mellitus ([place]) - increase lipitor to 80 mg since he's on it at his advanced age and able to tolerate it, we might as well get him to goal of less than 70 if we can - Lipid panel; Future  3. Mild cognitive impairment with memory loss -ongoing, we try to keep his son informed of any major med or clinical changes for his dad  4. Chronic kidney disease (CKD) stage G3a/A2, moderately decreased glomerular filtration rate (GFR) between 45-59 mL/min/1.73 square meter and albuminuria creatinine ratio between 30-299 mg/g (HCC) -Avoid nephrotoxic agents like nsaids, dose adjust renally excreted meds, hydrate. - BASIC METABOLIC PANEL WITH GFR; Future  5. Type 2 diabetes mellitus with stage 3a chronic kidney disease, without long-term current use of insulin (HCC) - well controlled - cont asa, statin, januvia, cannot take ace - Hemoglobin A1c; Future - BASIC METABOLIC PANEL WITH GFR; Future  6. Essential hypertension -bp controlled on current regimen--no changes needed, monitor - BASIC METABOLIC PANEL WITH GFR; Future  Labs/tests ordered:   Lab Orders     Hemoglobin A1c  Lipid panel     BASIC METABOLIC PANEL WITH GFR  Next appt: 4 mos med mgt, fasting labs before  Taron Conrey L. Mandee Pluta, D.O. [facility] [address], [place] [address] Cell Phone (Mon-Fri 8am-5pm):  [phone] On Call:  [phone] & follow prompts after 5pm & weekends Office Phone:  [phone] Office Fax:  [phone]

## 2020-05-25 NOTE — Telephone Encounter (Signed)
Patient called requesting refill

## 2020-05-26 ENCOUNTER — Other Ambulatory Visit: Payer: Self-pay | Admitting: Internal Medicine

## 2020-05-26 DIAGNOSIS — N1831 Chronic kidney disease, stage 3a: Secondary | ICD-10-CM

## 2020-06-06 ENCOUNTER — Other Ambulatory Visit: Payer: Self-pay

## 2020-06-06 ENCOUNTER — Encounter (INDEPENDENT_AMBULATORY_CARE_PROVIDER_SITE_OTHER): Payer: PPO | Admitting: Ophthalmology

## 2020-06-06 DIAGNOSIS — I10 Essential (primary) hypertension: Secondary | ICD-10-CM | POA: Diagnosis not present

## 2020-06-06 DIAGNOSIS — H353211 Exudative age-related macular degeneration, right eye, with active choroidal neovascularization: Secondary | ICD-10-CM

## 2020-06-06 DIAGNOSIS — H348322 Tributary (branch) retinal vein occlusion, left eye, stable: Secondary | ICD-10-CM | POA: Diagnosis not present

## 2020-06-06 DIAGNOSIS — H43813 Vitreous degeneration, bilateral: Secondary | ICD-10-CM

## 2020-06-06 DIAGNOSIS — H35033 Hypertensive retinopathy, bilateral: Secondary | ICD-10-CM | POA: Diagnosis not present

## 2020-06-08 ENCOUNTER — Other Ambulatory Visit: Payer: Self-pay

## 2020-06-08 DIAGNOSIS — J4489 Other specified chronic obstructive pulmonary disease: Secondary | ICD-10-CM

## 2020-06-08 DIAGNOSIS — J449 Chronic obstructive pulmonary disease, unspecified: Secondary | ICD-10-CM

## 2020-06-08 MED ORDER — ALBUTEROL SULFATE HFA 108 (90 BASE) MCG/ACT IN AERS: 2.0000 | INHALATION_SPRAY | Freq: Four times a day (QID) | RESPIRATORY_TRACT | 5 refills | Status: DC | PRN

## 2020-06-24 ENCOUNTER — Other Ambulatory Visit: Payer: Self-pay | Admitting: Internal Medicine

## 2020-06-26 NOTE — Telephone Encounter (Signed)
rx sent to pharmacy by e-script  

## 2020-07-03 ENCOUNTER — Other Ambulatory Visit: Payer: PPO

## 2020-07-05 ENCOUNTER — Other Ambulatory Visit: Payer: Self-pay | Admitting: *Deleted

## 2020-07-05 DIAGNOSIS — J449 Chronic obstructive pulmonary disease, unspecified: Secondary | ICD-10-CM

## 2020-07-05 MED ORDER — BUDESONIDE-FORMOTEROL FUMARATE 160-4.5 MCG/ACT IN AERO: INHALATION_SPRAY | RESPIRATORY_TRACT | 5 refills | Status: DC

## 2020-07-05 NOTE — Telephone Encounter (Signed)
Patient requested refill

## 2020-07-06 ENCOUNTER — Ambulatory Visit: Payer: PPO | Admitting: Internal Medicine

## 2020-07-11 ENCOUNTER — Encounter (INDEPENDENT_AMBULATORY_CARE_PROVIDER_SITE_OTHER): Payer: PPO | Admitting: Ophthalmology

## 2020-07-11 ENCOUNTER — Other Ambulatory Visit: Payer: Self-pay

## 2020-07-11 DIAGNOSIS — H348322 Tributary (branch) retinal vein occlusion, left eye, stable: Secondary | ICD-10-CM

## 2020-07-11 DIAGNOSIS — I10 Essential (primary) hypertension: Secondary | ICD-10-CM

## 2020-07-11 DIAGNOSIS — H43813 Vitreous degeneration, bilateral: Secondary | ICD-10-CM | POA: Diagnosis not present

## 2020-07-11 DIAGNOSIS — H353211 Exudative age-related macular degeneration, right eye, with active choroidal neovascularization: Secondary | ICD-10-CM

## 2020-07-11 DIAGNOSIS — H35033 Hypertensive retinopathy, bilateral: Secondary | ICD-10-CM

## 2020-07-12 ENCOUNTER — Other Ambulatory Visit: Payer: Self-pay | Admitting: *Deleted

## 2020-07-12 MED ORDER — TRAZODONE HCL 50 MG PO TABS
50.0000 mg | ORAL_TABLET | Freq: Every day | ORAL | 1 refills | Status: DC
Start: 2020-07-12 — End: 2020-08-04

## 2020-07-12 NOTE — Telephone Encounter (Signed)
-----   Message from Dellia Nims sent at 07/12/2020 10:21 AM EST ----- Regarding: Prescription Request Good morning Rodena Piety. The above patients son called in and said his dad needs an updated prescription for his "sleep meds". [facility] but they told him to call Dr. Mariea Clonts to get it updated. I looked on the list, but didn't see a prescription for sleep meds, but I probably overlooked it.  Mordecai Maes Joneen Boers)

## 2020-07-12 NOTE — Telephone Encounter (Signed)
Ok to resume trazodone.

## 2020-07-12 NOTE — Telephone Encounter (Signed)
I called and spoke with Ian [name] [phone] and he stated that patient is still taking Trazodone.   Medication was taken out of medication list back in June because of Cataract Surgery and scared he would fall. But patient continued medication and is now needing a refill for it.   Is it ok to add back and send to pharmacy.  Please Advise.

## 2020-07-12 NOTE — Telephone Encounter (Signed)
Medication list updated.  Pended Rx and sent to Dr. Mariea Clonts for approval due to [place].

## 2020-07-28 ENCOUNTER — Encounter: Payer: PPO | Admitting: Family

## 2020-07-28 ENCOUNTER — Telehealth: Payer: Self-pay

## 2020-07-28 ENCOUNTER — Other Ambulatory Visit: Payer: Self-pay

## 2020-07-28 NOTE — Telephone Encounter (Signed)
Noted  

## 2020-07-28 NOTE — Telephone Encounter (Signed)
Working remotely I called patient at 8:53 am , and 8:58 am. Voicemail was left for patient. I called patient number again on [employer] and patient son "Lawrene Bowcutt" answered the phone. He states that his Dad probably cant hear his phone. Patient told son last night that he had doctors appointment 07/28/2020. Patient son states that patient most likely came to office today 07/28/2020. Patient son states that when he gets into contact with him that he'll let him know to call [facility] office and press 9. I also told patient son to notify his dad that if he was to see a private or no called ID number. It's most likely me calling to start his AWV and to pick up phone. Message routed to Marlowe Sax, NP .

## 2020-08-04 ENCOUNTER — Other Ambulatory Visit: Payer: Self-pay | Admitting: Internal Medicine

## 2020-08-04 NOTE — Telephone Encounter (Signed)
Dr.[name] please advise if you would like for me to approve a 90 day supply versus 30 of trazodone

## 2020-08-07 ENCOUNTER — Encounter (INDEPENDENT_AMBULATORY_CARE_PROVIDER_SITE_OTHER): Payer: PPO | Admitting: Ophthalmology

## 2020-08-07 ENCOUNTER — Other Ambulatory Visit: Payer: Self-pay

## 2020-08-07 DIAGNOSIS — I10 Essential (primary) hypertension: Secondary | ICD-10-CM

## 2020-08-07 DIAGNOSIS — H2512 Age-related nuclear cataract, left eye: Secondary | ICD-10-CM

## 2020-08-07 DIAGNOSIS — H43813 Vitreous degeneration, bilateral: Secondary | ICD-10-CM

## 2020-08-07 DIAGNOSIS — H353211 Exudative age-related macular degeneration, right eye, with active choroidal neovascularization: Secondary | ICD-10-CM | POA: Diagnosis not present

## 2020-08-07 DIAGNOSIS — H35033 Hypertensive retinopathy, bilateral: Secondary | ICD-10-CM | POA: Diagnosis not present

## 2020-08-07 DIAGNOSIS — H34832 Tributary (branch) retinal vein occlusion, left eye, with macular edema: Secondary | ICD-10-CM | POA: Diagnosis not present

## 2020-08-08 ENCOUNTER — Encounter (INDEPENDENT_AMBULATORY_CARE_PROVIDER_SITE_OTHER): Payer: PPO | Admitting: Ophthalmology

## 2020-08-16 ENCOUNTER — Other Ambulatory Visit: Payer: Self-pay | Admitting: *Deleted

## 2020-08-16 DIAGNOSIS — J449 Chronic obstructive pulmonary disease, unspecified: Secondary | ICD-10-CM

## 2020-08-16 MED ORDER — BUDESONIDE-FORMOTEROL FUMARATE 160-4.5 MCG/ACT IN AERO: INHALATION_SPRAY | RESPIRATORY_TRACT | 5 refills | Status: DC

## 2020-08-16 NOTE — Telephone Encounter (Signed)
Patient requested refill

## 2020-08-25 ENCOUNTER — Other Ambulatory Visit: Payer: Self-pay | Admitting: Internal Medicine

## 2020-08-25 DIAGNOSIS — F418 Other specified anxiety disorders: Secondary | ICD-10-CM

## 2020-08-28 ENCOUNTER — Encounter: Payer: Self-pay | Admitting: Internal Medicine

## 2020-09-04 ENCOUNTER — Encounter (INDEPENDENT_AMBULATORY_CARE_PROVIDER_SITE_OTHER): Payer: PPO | Admitting: Ophthalmology

## 2020-09-04 ENCOUNTER — Other Ambulatory Visit: Payer: Self-pay

## 2020-09-04 DIAGNOSIS — H35033 Hypertensive retinopathy, bilateral: Secondary | ICD-10-CM | POA: Diagnosis not present

## 2020-09-04 DIAGNOSIS — H348322 Tributary (branch) retinal vein occlusion, left eye, stable: Secondary | ICD-10-CM | POA: Diagnosis not present

## 2020-09-04 DIAGNOSIS — H43813 Vitreous degeneration, bilateral: Secondary | ICD-10-CM

## 2020-09-04 DIAGNOSIS — H353211 Exudative age-related macular degeneration, right eye, with active choroidal neovascularization: Secondary | ICD-10-CM | POA: Diagnosis not present

## 2020-09-04 DIAGNOSIS — H353122 Nonexudative age-related macular degeneration, left eye, intermediate dry stage: Secondary | ICD-10-CM | POA: Diagnosis not present

## 2020-09-04 DIAGNOSIS — I10 Essential (primary) hypertension: Secondary | ICD-10-CM

## 2020-09-08 ENCOUNTER — Telehealth: Payer: Self-pay

## 2020-09-08 ENCOUNTER — Encounter: Payer: Self-pay | Admitting: Nurse Practitioner

## 2020-09-08 ENCOUNTER — Other Ambulatory Visit: Payer: Self-pay

## 2020-09-08 ENCOUNTER — Ambulatory Visit (INDEPENDENT_AMBULATORY_CARE_PROVIDER_SITE_OTHER): Payer: PPO | Admitting: Nurse Practitioner

## 2020-09-08 DIAGNOSIS — Z0000 Encounter for general adult medical examination without abnormal findings: Secondary | ICD-10-CM

## 2020-09-08 NOTE — Progress Notes (Signed)
Subjective:   Ian [name] is a 85 y.o. male who presents for Medicare Annual/Subsequent preventive examination.  Review of Systems     Cardiac Risk Factors include: advanced age (>64mn, >>16women);hypertension;dyslipidemia;diabetes mellitus;male gender     Objective:    There were no vitals filed for this visit. There is no height or weight on file to calculate BMI.  Advanced Directives 09/08/2020 05/25/2020 03/06/2020 01/24/2020 08/23/2019 07/26/2019 07/26/2019  Does Patient Have a Medical Advance Directive? Yes No _0   Type of Advance Directive Living will - [place] D[place] (pink MOST or yellow form) Out of facility DNR (pink MOST or yellow form);Healthcare Power of Attorney Living will;Healthcare Power of Attorney Living will Living will  Does patient want to make changes to medical advance directive? No - Patient declined - No - Guardian declined No - Patient declined No - Patient declined No - Patient declined No - Patient declined  Copy of [place] Chart? - - Yes - validated most recent copy scanned in chart (See row information) Yes - validated most recent copy scanned in chart (See row information) Yes - validated most recent copy scanned in chart (See row information) - -  Would patient like information on creating a medical advance directive? - No - Patient declined - - - - -  Pre-existing out of facility DNR order (yellow form or pink MOST form) - - Pink MOST/Yellow Form most recent copy in chart - Physician notified to receive inpatient order Pink MOST/Yellow Form most recent copy in chart - Physician notified to receive inpatient order - - -    Current Medications (verified) Outpatient Encounter Medications as of 09/08/2020  Medication Sig  . acetaminophen (TYLENOL) 500 MG tablet Take 1,000 mg by mouth every 8 (eight) hours as needed.  .Marland Kitchenalbuterol (VENTOLIN HFA) 108 (90 Base) MCG/ACT inhaler Inhale 2 puffs into  the lungs every 6 (six) hours as needed for wheezing or shortness of breath.  .Marland KitchenamLODipine (NORVASC) 10 MG tablet TAKE 1 TABLET BY MOUTH EVERY DAY  . ASPIRIN LOW DOSE 81 MG EC tablet TAKE 1 TABLET BY MOUTH EVERY DAY  . atorvastatin (LIPITOR) 40 MG tablet TAKE 1 TABLET BY MOUTH EVERY DAY  . atorvastatin (LIPITOR) 80 MG tablet Take 1 tablet (80 mg total) by mouth daily.  .Marland KitchenBESIVANCE 0.6 % SUSP INSTILL ONE DROP INTO RIGHT EYE 4 TIMES A DAY FOR 2 DAYS AFTER EACH MONTHLY EYE INJECTION  . betamethasone dipropionate 0.05 % cream Apply topically to affected area after bathing daily for the next 30 days  . blood glucose meter kit and supplies KIT Inject 1 each into the skin 2 (two) times daily. Dispense based on patient and insurance preference. Use up to four times daily as directed. (FOR ICD-9 250.00, 250.01).  . budesonide-formoterol (SYMBICORT) 160-4.5 MCG/ACT inhaler TAKE TWO PUFFS BY MOUTH TWICE DAILY  . diclofenac sodium (VOLTAREN) 1 % GEL Apply 2 g topically 4 (four) times daily as needed (shoulder pain).  . fluticasone (FLONASE) 50 MCG/ACT nasal spray Place 2 sprays into both nostrils daily.  .Marland Kitchenglucose blood (ONE TOUCH ULTRA TEST) test strip Use to test blood sugar twice daily.  DX: E11.9  . hydrALAZINE (APRESOLINE) 25 MG tablet TAKE 1 TABLET BY MOUTH TWICE A DAY  . ipratropium-albuterol (DUONEB) 0.5-2.5 (3) MG/3ML SOLN Take 3 mLs by nebulization every 6 (six) hours as needed (wheezing).  .Marland KitchenJANUVIA 25 MG tablet TAKE 1  TABLET BY MOUTH EVERY DAY  . Lancet Devices (ONE TOUCH DELICA LANCING DEV) MISC Use to test blood sugar once daily dx: E119  . Lancets (ONETOUCH ULTRASOFT) lancets Use to test blood sugar up to three times daily. Dx E11.9  . metoprolol tartrate (LOPRESSOR) 50 MG tablet TAKE 1 TABLET BY MOUTH TWICE A DAY  . montelukast (SINGULAIR) 10 MG tablet TAKE 1 TABLET BY MOUTH AT BEDTIME  . OVER THE COUNTER MEDICATION 1 application daily. BACTRIM CREAM  . PARoxetine (PAXIL) 30 MG tablet TAKE 1  TABLET BY MOUTH EVERY DAY  . sodium chloride (OCEAN) 0.65 % SOLN nasal spray Place 1 spray into both nostrils as needed for congestion.  . traZODone (DESYREL) 50 MG tablet TAKE 1 TABLET BY MOUTH EVERYDAY AT BEDTIME  . trimethoprim-polymyxin b (POLYTRIM) ophthalmic solution INSTILL ONE DROP INTO RIGHT EYE 4 TIMES A DAY FOR 2 DAYS AFTER EACH MONTHLY EYE INJECTION  . Vitamin D, Ergocalciferol, (DRISDOL) 1.25 MG (50000 UNIT) CAPS capsule Take 50,000 Units by mouth once a week.   No facility-administered encounter medications on file as of 09/08/2020.    Allergies (verified) Ace inhibitors   History: Past Medical History:  Diagnosis Date  . Allergy   . Alzheimer's disease ([place])   . Anemia, unspecified   . Anxiety   . Chronic maxillary sinusitis   . COPD (chronic obstructive pulmonary disease) ([place])   . Depression   . Diabetes mellitus   . Edema   . Hypercalcemia   . Hyperlipidemia   . Hypertension   . Hypertrophy of prostate without urinary obstruction and other lower urinary tract symptoms (LUTS)   . Hypertrophy of prostate without urinary obstruction and other lower urinary tract symptoms (LUTS)   . Kidney disease    mild  . Unspecified disorder of kidney and ureter   . Unspecified sleep apnea   . Ventral hernia, unspecified, without mention of obstruction or gangrene    Past Surgical History:  Procedure Laterality Date  . EXPLORATORY LAPAROTOMY  2002  . HERNIA REPAIR     Family History  Problem Relation Age of Onset  . Pancreatic cancer Sister   . Heart disease Father   . Hypertension Brother   . Hypertension Sister    Social History   Socioeconomic History  . Marital status: Married    Spouse name: Not on file  . Number of children: 2  . Years of education: Not on file  . Highest education level: Not on file  Occupational History  . Occupation: retired    Comment: [employer]  Tobacco Use  . Smoking status: Former Smoker    Packs/day: 1.50    Years: 30.00     Pack years: 45.00    Types: Cigarettes    Quit date: 07/08/1988    Years since quitting: 32.1  . Smokeless tobacco: Never Used  Vaping Use  . Vaping Use: Never used  Substance and Sexual Activity  . Alcohol use: No  . Drug use: No  . Sexual activity: Not Currently  Other Topics Concern  . Not on file  Social History Narrative  . Not on file   Social Determinants of Health   Financial Resource Strain: Not on file  Food Insecurity: Not on file  Transportation Needs: Not on file  Physical Activity: Not on file  Stress: Not on file  Social Connections: Not on file    Tobacco Counseling Counseling given: Not Answered   Clinical Intake:  Pre-visit preparation completed: Yes  Pain : No/denies pain     BMI - recorded: 28 Nutritional Status: BMI 25 -29 Overweight Nutritional Risks: None Diabetes: No  How often do you need to have someone help you when you read instructions, pamphlets, or other written materials from your doctor or pharmacy?: 1 - Never  Diabetic?yes         Activities of Daily Living In your present state of health, do you have any difficulty performing the following activities: 09/08/2020  Hearing? Y  Vision? N  Difficulty concentrating or making decisions? Y  Walking or climbing stairs? N  Dressing or bathing? N  Doing errands, shopping? N  Preparing Food and eating ? N  Using the Toilet? N  In the past six months, have you accidently leaked urine? N  Do you have problems with loss of bowel control? N  Managing your Medications? Y  Managing your Finances? Y  Housekeeping or managing your Housekeeping? Y  Some recent data might be hidden    Patient Care Team: Lauree Chandler, NP as PCP - General (Geriatric Medicine) Zebedee Iba., MD as Referring Physician (Ophthalmology) Chesley Mires, MD as Consulting Physician (Pulmonary Disease) Lavonna Monarch, MD as Consulting Physician (Dermatology)  Indicate any recent Medical Services you  may have received from other than [facility] providers in the past year (date may be approximate).     Assessment:   This is a routine wellness examination for Favian.  Hearing/Vision screen  Hearing Screening   _0  _1  _2  _3  _4  _5  _6  _7  _8   Right ear:           Left ear:           Comments: Patient wears hearing aids.  Vision Screening Comments: Patient has no vision problems. Patient's last eye exam was March 1,2022. Patient sees Dr. Zigmund Daniel.  Dietary issues and exercise activities discussed: Current Exercise Habits: Structured exercise class;Home exercise routine, Type of exercise: strength training/weights;calisthenics, Time (Minutes): 45, Frequency (Times/Week): 5, Weekly Exercise (Minutes/Week): 225  Goals    . increase water     Patient will drink 6 8 oz glasses a day.    . Patient Stated     Would like to live another year       Depression Screen PHQ 2/9 Scores 09/08/2020 05/25/2020 03/06/2020 05/27/2019 01/25/2019 07/16/2018 07/06/2018  PHQ - 2 Score 0 0 0 0 0 0 0    Fall Risk Fall Risk  09/08/2020 05/25/2020 03/06/2020 01/24/2020 05/27/2019  Falls in the past year? 0 0 0 - 0  Number falls in past yr: 0 0 0 0 -  Injury with Fall? 0 0 0 0 0    FALL RISK PREVENTION PERTAINING TO THE HOME:  Any stairs in or around the home? No  If so, are there any without handrails? No  Home free of loose throw rugs in walkways, pet beds, electrical cords, etc? Yes  Adequate lighting in your home to reduce risk of falls? Yes   ASSISTIVE DEVICES UTILIZED TO PREVENT FALLS:  Life alert? Yes  Use of a cane, walker or w/c? No  Grab bars in the bathroom? Yes  Shower chair or bench in shower? Yes  Elevated toilet seat or a handicapped toilet? Yes   TIMED UP AND GO:  Was the test performed? No .    Cognitive Function: MMSE - Mini Mental State Exam 07/26/2019 07/06/2018 07/02/2017 06/03/2016 05/12/2015  Orientation to time _9 Orientation to Place 5 5 5  5  5  Registration _0 Attention/ Calculation _1 Recall _2 Language- name 2 objects _3 Language- repeat _4 Language- follow 3 step command _5 Language- read & follow direction _6 Write a sentence _7 Copy design 0 0 0 0 1  Total score _8 6CIT Screen 09/08/2020  What Year? 0 points  What month? 0 points  What time? 0 points  Count back from 20 2 points  Months in reverse 4 points  Repeat phrase 8 points  Total Score 14    Immunizations Immunization History  Administered Date(s) Administered  . Fluad Quad(high Dose 65+) 05/27/2019, 03/30/2020  . Influenza Split 04/08/2011  . Influenza Whole 04/07/2012  . Influenza, High Dose Seasonal PF 04/07/2017, 05/25/2018, 07/22/2019  . Influenza,inj,Quad PF,6+ Mos 04/23/2013, 03/28/2014, 02/23/2016  . Influenza-Unspecified 04/08/2015, 07/09/2015, 04/08/2017  . Moderna Sars-Covid-2 Vaccination 10/07/2019, 11/07/2019, 05/10/2020  . Pneumococcal Conjugate-13 06/06/2014  . Pneumococcal-Unspecified 07/09/2007  . Td 08/09/1997  . Tdap 08/12/2011    TDAP status: Up to date  Flu Vaccine status: Up to date  Pneumococcal vaccine status: Up to date  Covid-19 vaccine status: Completed vaccines  Qualifies for Shingles Vaccine? Yes   Zostavax completed No   Shingrix Completed?: No.    Education has been provided regarding the importance of this vaccine. Patient has been advised to call insurance company to determine out of pocket expense if they have not yet received this vaccine. Advised may also receive vaccine at local pharmacy or Health Dept. Verbalized acceptance and understanding.  Screening Tests Health Maintenance  Topic Date Due  . FOOT EXAM  05/26/2020  . OPHTHALMOLOGY EXAM  09/27/2020  . COVID-19 Vaccine (4 - Booster for Moderna series) 11/07/2020  . HEMOGLOBIN A1C  11/20/2020  . TETANUS/TDAP  08/11/2021  . INFLUENZA VACCINE  Completed  . PNA vac Low  Risk Adult  Completed  . HPV VACCINES  Aged Out    Health Maintenance  Health Maintenance Due  Topic Date Due  . FOOT EXAM  05/26/2020    Colorectal cancer screening: No longer required.   Lung Cancer Screening: (Low Dose CT Chest recommended if Age 11-80 years, 30 pack-year currently smoking OR have quit w/in 15years.) does not qualify.  Additional Screening:  Hepatitis C Screening: does not qualify  Vision Screening: Recommended annual ophthalmology exams for early detection of glaucoma and other disorders of the eye. Is the patient up to date with their annual eye exam?  Yes  Who is the provider or what is the name of the office in which the patient attends annual eye exams? Dr Zigmund Daniel If pt is not established with a provider, would they like to be referred to a provider to establish care? No .   Dental Screening: Recommended annual dental exams for proper oral hygiene  Community Resource Referral / Chronic Care Management: CRR required this visit?  No   CCM required this visit?  No      Plan:     I have personally reviewed and noted the following in the patient's chart:   . Medical and social history . Use of alcohol, tobacco or illicit drugs  . Current medications and supplements . Functional ability and status . Nutritional status . Physical activity .  Advanced directives . List of other physicians . Hospitalizations, surgeries, and ER visits in previous 12 months . Vitals . Screenings to include cognitive, depression, and falls . Referrals and appointments  In addition, I have reviewed and discussed with patient certain preventive protocols, quality metrics, and best practice recommendations. A written personalized care plan for preventive services as well as general preventive health recommendations were provided to patient.     Lauree Chandler, NP   09/08/2020    Virtual Visit via Telephone Note  I connected with pt on 09/08/20 at  9:30 AM EST by  telephone and verified that I am speaking with the correct person using two identifiers.  Location: Patient: home [facility]   I discussed the limitations, risks, security and privacy concerns of performing an evaluation and management service by telephone and the availability of in person appointments. I also discussed with the patient that there may be a patient responsible charge related to this service. The patient expressed understanding and agreed to proceed.   I discussed the assessment and treatment plan with the patient. The patient was provided an opportunity to ask questions and all were answered. The patient agreed with the plan and demonstrated an understanding of the instructions.   The patient was advised to call back or seek an in-person evaluation if the symptoms worsen or if the condition fails to improve as anticipated.  I provided 18 minutes of non-face-to-face time during this encounter.  Carlos American. Harle Battiest Avs printed and mailed

## 2020-09-08 NOTE — Progress Notes (Signed)
This service is provided via telemedicine  No vital signs collected/recorded due to the encounter was a telemedicine visit.   Location of patient (ex: home, work): Home  Patient consents to a telephone visit:  Yes, see encounter dated 09/08/2020  Location of the provider (ex: office, home): [facility]  Name of any referring provider: N/A  Names of all persons participating in the telemedicine service and their role in the encounter:  Sherrie Mustache, Nurse Practitioner, Carroll Kinds, CMA, and patient.   Time spent on call:  12 minutes with medical assistant

## 2020-09-08 NOTE — Patient Instructions (Signed)
Ian [name] , Thank you for taking time to come for your Medicare Wellness Visit. I appreciate your ongoing commitment to your health goals. Please review the following plan we discussed and let me know if I can assist you in the future.   Screening recommendations/referrals: Colonoscopy aged out Recommended yearly ophthalmology/optometry visit for glaucoma screening and checkup Recommended yearly dental visit for hygiene and checkup  Vaccinations: Influenza vaccine up to date Pneumococcal vaccine up to date Tdap vaccine up to date  Shingles vaccine RECOMMENDED- to get at local pharmacy.     Advanced directives: on file.   Conditions/risks identified: memory loss, hypertension, diabetes, advanced age.   Next appointment:   Preventive Care 46 Years and Older, Male Preventive care refers to lifestyle choices and visits with your health care provider that can promote health and wellness. What does preventive care include?  A yearly physical exam. This is also called an annual well check.  Dental exams once or twice a year.  Routine eye exams. Ask your health care provider how often you should have your eyes checked.  Personal lifestyle choices, including:  Daily care of your teeth and gums.  Regular physical activity.  Eating a healthy diet.  Avoiding tobacco and drug use.  Limiting alcohol use.  Practicing safe sex.  Taking low doses of aspirin every day.  Taking vitamin and mineral supplements as recommended by your health care provider. What happens during an annual well check? The services and screenings done by your health care provider during your annual well check will depend on your age, overall health, lifestyle risk factors, and family history of disease. Counseling  Your health care provider may ask you questions about your:  Alcohol use.  Tobacco use.  Drug use.  Emotional well-being.  Home and relationship well-being.  Sexual activity.  Eating  habits.  History of falls.  Memory and ability to understand (cognition).  Work and work Statistician. Screening  You may have the following tests or measurements:  Height, weight, and BMI.  Blood pressure.  Lipid and cholesterol levels. These may be checked every 5 years, or more frequently if you are over 45 years old.  Skin check.  Lung cancer screening. You may have this screening every year starting at age 41 if you have a 30-pack-year history of smoking and currently smoke or have quit within the past 15 years.  Fecal occult blood test (FOBT) of the stool. You may have this test every year starting at age 74.  Flexible sigmoidoscopy or colonoscopy. You may have a sigmoidoscopy every 5 years or a colonoscopy every 10 years starting at age 34.  Prostate cancer screening. Recommendations will vary depending on your family history and other risks.  Hepatitis C blood test.  Hepatitis B blood test.  Sexually transmitted disease (STD) testing.  Diabetes screening. This is done by checking your blood sugar (glucose) after you have not eaten for a while (fasting). You may have this done every 1-3 years.  Abdominal aortic aneurysm (AAA) screening. You may need this if you are a current or former smoker.  Osteoporosis. You may be screened starting at age 8 if you are at high risk. Talk with your health care provider about your test results, treatment options, and if necessary, the need for more tests. Vaccines  Your health care provider may recommend certain vaccines, such as:  Influenza vaccine. This is recommended every year.  Tetanus, diphtheria, and acellular pertussis (Tdap, Td) vaccine. You may need a Td  booster every 10 years.  Zoster vaccine. You may need this after age 18.  Pneumococcal 13-valent conjugate (PCV13) vaccine. One dose is recommended after age 73.  Pneumococcal polysaccharide (PPSV23) vaccine. One dose is recommended after age 63. Talk to your health  care provider about which screenings and vaccines you need and how often you need them. This information is not intended to replace advice given to you by your health care provider. Make sure you discuss any questions you have with your health care provider. Document Released: 07/21/2015 Document Revised: 03/13/2016 Document Reviewed: 04/25/2015 Elsevier Interactive Patient Education  2017 [employer] Prevention in the Home Falls can cause injuries. They can happen to people of all ages. There are many things you can do to make your home safe and to help prevent falls. What can I do on the outside of my home?  Regularly fix the edges of walkways and driveways and fix any cracks.  Remove anything that might make you trip as you walk through a door, such as a raised step or threshold.  Trim any bushes or trees on the path to your home.  Use bright outdoor lighting.  Clear any walking paths of anything that might make someone trip, such as rocks or tools.  Regularly check to see if handrails are loose or broken. Make sure that both sides of any steps have handrails.  Any raised decks and porches should have guardrails on the edges.  Have any leaves, snow, or ice cleared regularly.  Use sand or salt on walking paths during winter.  Clean up any spills in your garage right away. This includes oil or grease spills. What can I do in the bathroom?  Use night lights.  Install grab bars by the toilet and in the tub and shower. Do not use towel bars as grab bars.  Use non-skid mats or decals in the tub or shower.  If you need to sit down in the shower, use a plastic, non-slip stool.  Keep the floor dry. Clean up any water that spills on the floor as soon as it happens.  Remove soap buildup in the tub or shower regularly.  Attach bath mats securely with double-sided non-slip rug tape.  Do not have throw rugs and other things on the floor that can make you trip. What can I do  in the bedroom?  Use night lights.  Make sure that you have a light by your bed that is easy to reach.  Do not use any sheets or blankets that are too big for your bed. They should not hang down onto the floor.  Have a firm chair that has side arms. You can use this for support while you get dressed.  Do not have throw rugs and other things on the floor that can make you trip. What can I do in the kitchen?  Clean up any spills right away.  Avoid walking on wet floors.  Keep items that you use a lot in easy-to-reach places.  If you need to reach something above you, use a strong step stool that has a grab bar.  Keep electrical cords out of the way.  Do not use floor polish or wax that makes floors slippery. If you must use wax, use non-skid floor wax.  Do not have throw rugs and other things on the floor that can make you trip. What can I do with my stairs?  Do not leave any items on the stairs.  Make sure that there are handrails on both sides of the stairs and use them. Fix handrails that are broken or loose. Make sure that handrails are as long as the stairways.  Check any carpeting to make sure that it is firmly attached to the stairs. Fix any carpet that is loose or worn.  Avoid having throw rugs at the top or bottom of the stairs. If you do have throw rugs, attach them to the floor with carpet tape.  Make sure that you have a light switch at the top of the stairs and the bottom of the stairs. If you do not have them, ask someone to add them for you. What else can I do to help prevent falls?  Wear shoes that:  Do not have high heels.  Have rubber bottoms.  Are comfortable and fit you well.  Are closed at the toe. Do not wear sandals.  If you use a stepladder:  Make sure that it is fully opened. Do not climb a closed stepladder.  Make sure that both sides of the stepladder are locked into place.  Ask someone to hold it for you, if possible.  Clearly mark  and make sure that you can see:  Any grab bars or handrails.  First and last steps.  Where the edge of each step is.  Use tools that help you move around (mobility aids) if they are needed. These include:  Canes.  Walkers.  Scooters.  Crutches.  Turn on the lights when you go into a dark area. Replace any light bulbs as soon as they burn out.  Set up your furniture so you have a clear path. Avoid moving your furniture around.  If any of your floors are uneven, fix them.  If there are any pets around you, be aware of where they are.  Review your medicines with your doctor. Some medicines can make you feel dizzy. This can increase your chance of falling. Ask your doctor what other things that you can do to help prevent falls. This information is not intended to replace advice given to you by your health care provider. Make sure you discuss any questions you have with your health care provider. Document Released: 04/20/2009 Document Revised: 11/30/2015 Document Reviewed: 07/29/2014 Elsevier Interactive Patient Education  2017 [employer].

## 2020-09-08 NOTE — Telephone Encounter (Signed)
Mr. hatch, pitzer are scheduled for a virtual visit with your provider today.    Just as we do with appointments in the office, we must obtain your consent to participate.  Your consent will be active for this visit and any virtual visit you may have with one of our providers in the next 365 days.    If you have a MyChart account, I can also send a copy of this consent to you electronically.  All virtual visits are billed to your insurance company just like a traditional visit in the office.  As this is a virtual visit, video technology does not allow for your provider to perform a traditional examination.  This may limit your provider's ability to fully assess your condition.  If your provider identifies any concerns that need to be evaluated in person or the need to arrange testing such as labs, EKG, etc, we will make arrangements to do so.    Although advances in technology are sophisticated, we cannot ensure that it will always work on either your end or our end.  If the connection with a video visit is poor, we may have to switch to a telephone visit.  With either a video or telephone visit, we are not always able to ensure that we have a secure connection.   I need to obtain your verbal consent now.   Are you willing to proceed with your visit today?   [facility] has provided verbal consent on 09/08/2020 for a virtual visit (video or telephone).   Carroll Kinds, CMA 09/08/2020  1:22 PM

## 2020-09-13 ENCOUNTER — Other Ambulatory Visit: Payer: Self-pay | Admitting: Internal Medicine

## 2020-09-13 DIAGNOSIS — J449 Chronic obstructive pulmonary disease, unspecified: Secondary | ICD-10-CM

## 2020-09-18 ENCOUNTER — Other Ambulatory Visit: Payer: Self-pay | Admitting: Internal Medicine

## 2020-09-19 ENCOUNTER — Other Ambulatory Visit: Payer: Self-pay | Admitting: Family

## 2020-09-26 ENCOUNTER — Other Ambulatory Visit: Payer: PPO

## 2020-09-26 ENCOUNTER — Other Ambulatory Visit: Payer: Self-pay

## 2020-09-26 DIAGNOSIS — N1831 Chronic kidney disease, stage 3a: Secondary | ICD-10-CM

## 2020-09-26 DIAGNOSIS — E1169 Type 2 diabetes mellitus with other specified complication: Secondary | ICD-10-CM

## 2020-09-26 DIAGNOSIS — I10 Essential (primary) hypertension: Secondary | ICD-10-CM

## 2020-09-28 ENCOUNTER — Ambulatory Visit: Payer: PPO | Admitting: Internal Medicine

## 2020-10-02 ENCOUNTER — Other Ambulatory Visit: Payer: Self-pay

## 2020-10-02 ENCOUNTER — Encounter (INDEPENDENT_AMBULATORY_CARE_PROVIDER_SITE_OTHER): Payer: PPO | Admitting: Ophthalmology

## 2020-10-02 DIAGNOSIS — H43813 Vitreous degeneration, bilateral: Secondary | ICD-10-CM

## 2020-10-02 DIAGNOSIS — H348322 Tributary (branch) retinal vein occlusion, left eye, stable: Secondary | ICD-10-CM

## 2020-10-02 DIAGNOSIS — H353211 Exudative age-related macular degeneration, right eye, with active choroidal neovascularization: Secondary | ICD-10-CM | POA: Diagnosis not present

## 2020-10-02 DIAGNOSIS — H353122 Nonexudative age-related macular degeneration, left eye, intermediate dry stage: Secondary | ICD-10-CM | POA: Diagnosis not present

## 2020-10-02 DIAGNOSIS — I10 Essential (primary) hypertension: Secondary | ICD-10-CM

## 2020-10-02 DIAGNOSIS — H35033 Hypertensive retinopathy, bilateral: Secondary | ICD-10-CM | POA: Diagnosis not present

## 2020-10-04 DIAGNOSIS — H25812 Combined forms of age-related cataract, left eye: Secondary | ICD-10-CM | POA: Diagnosis not present

## 2020-10-04 DIAGNOSIS — Z961 Presence of intraocular lens: Secondary | ICD-10-CM | POA: Diagnosis not present

## 2020-10-09 ENCOUNTER — Ambulatory Visit: Payer: PPO | Admitting: Internal Medicine

## 2020-10-09 ENCOUNTER — Ambulatory Visit (INDEPENDENT_AMBULATORY_CARE_PROVIDER_SITE_OTHER): Payer: PPO | Admitting: Nurse Practitioner

## 2020-10-09 ENCOUNTER — Encounter: Payer: Self-pay | Admitting: Nurse Practitioner

## 2020-10-09 ENCOUNTER — Other Ambulatory Visit: Payer: Self-pay

## 2020-10-09 VITALS — BP 122/70 | HR 66 | Temp 96.9°F | Ht 67.0 in | Wt 176.6 lb

## 2020-10-09 DIAGNOSIS — I10 Essential (primary) hypertension: Secondary | ICD-10-CM | POA: Diagnosis not present

## 2020-10-09 DIAGNOSIS — E1122 Type 2 diabetes mellitus with diabetic chronic kidney disease: Secondary | ICD-10-CM | POA: Diagnosis not present

## 2020-10-09 DIAGNOSIS — E559 Vitamin D deficiency, unspecified: Secondary | ICD-10-CM

## 2020-10-09 DIAGNOSIS — J449 Chronic obstructive pulmonary disease, unspecified: Secondary | ICD-10-CM

## 2020-10-09 DIAGNOSIS — N184 Chronic kidney disease, stage 4 (severe): Secondary | ICD-10-CM | POA: Diagnosis not present

## 2020-10-09 DIAGNOSIS — E785 Hyperlipidemia, unspecified: Secondary | ICD-10-CM

## 2020-10-09 DIAGNOSIS — N1831 Chronic kidney disease, stage 3a: Secondary | ICD-10-CM

## 2020-10-09 DIAGNOSIS — E1169 Type 2 diabetes mellitus with other specified complication: Secondary | ICD-10-CM

## 2020-10-09 DIAGNOSIS — I5032 Chronic diastolic (congestive) heart failure: Secondary | ICD-10-CM

## 2020-10-09 DIAGNOSIS — G3184 Mild cognitive impairment, so stated: Secondary | ICD-10-CM | POA: Diagnosis not present

## 2020-10-09 NOTE — Progress Notes (Signed)
Careteam: Patient Care Team: Lauree Chandler, NP as PCP - General (Geriatric Medicine) Zebedee Iba., MD as Referring Physician (Ophthalmology) Chesley Mires, MD as Consulting Physician (Pulmonary Disease) Lavonna Monarch, MD as Consulting Physician (Dermatology)  PLACE OF SERVICE:  [place] Directive information Does Patient Have a Medical Advance Directive?: Yes, Type of Advance Directive: [place];Living will;Out of facility DNR (pink MOST or yellow form), Pre-existing out of facility DNR order (yellow form or pink MOST form): Yellow form placed in chart (order not valid for inpatient use), Does patient want to make changes to medical advance directive?: No - Patient declined  Allergies  Allergen Reactions  . Ace Inhibitors Cough    Chief Complaint  Patient presents with  . Medication Management    4 month follow-up. Discuss need for eye and foot exam. Clarify which dose of Lipitor patient should be on. Patient is requesting a stronger anxiety and depression medication, paxil is not working as good as when originally prescribed. Patient questions if he should be on prescribed vit D      HPI: Patient is a 85 y.o. male for routine follow up  COPD- breathing well. Does not have albuterol inhaler- would like rx for that today.   htn- on norvasc, hydralazine, metoprolol   Insomnia- sleeping well. Taking trazodone at night. Sleeps 10-12 hours.   Diabetes- no low blood sugar. Checks blood sugars routinely  Reports brother recently died but now in a better place. Reports he is having some anxiety and depression related to the loss but feels like this is expected. Overall feels like mood is controlled on paxil.  Review of Systems:  Review of Systems  Constitutional: Negative for chills, fever and weight loss.  HENT: Negative for tinnitus.   Respiratory: Negative for cough, sputum production and shortness of breath.   Cardiovascular: Negative  for chest pain, palpitations and leg swelling.  Gastrointestinal: Negative for abdominal pain, constipation, diarrhea and heartburn.  Genitourinary: Negative for dysuria, frequency and urgency.  Musculoskeletal: Negative for back pain, falls, joint pain and myalgias.  Skin: Negative.   Neurological: Negative for dizziness and headaches.  Psychiatric/Behavioral: Negative for depression and memory loss. The patient does not have insomnia.     Past Medical History:  Diagnosis Date  . Allergy   . Alzheimer's disease ([place])   . Anemia, unspecified   . Anxiety   . Chronic maxillary sinusitis   . COPD (chronic obstructive pulmonary disease) ([place])   . Depression   . Diabetes mellitus   . Edema   . Hypercalcemia   . Hyperlipidemia   . Hypertension   . Hypertrophy of prostate without urinary obstruction and other lower urinary tract symptoms (LUTS)   . Hypertrophy of prostate without urinary obstruction and other lower urinary tract symptoms (LUTS)   . Kidney disease    mild  . Unspecified disorder of kidney and ureter   . Unspecified sleep apnea   . Ventral hernia, unspecified, without mention of obstruction or gangrene    Past Surgical History:  Procedure Laterality Date  . EXPLORATORY LAPAROTOMY  2002  . HERNIA REPAIR     Social History:   reports that he quit smoking about 32 years ago. His smoking use included cigarettes. He has a 45.00 pack-year smoking history. He has never used smokeless tobacco. He reports that he does not drink alcohol and does not use drugs.  Family History  Problem Relation Age of Onset  . Pancreatic cancer  Sister   . Heart disease Father   . Hypertension Brother   . Hypertension Sister     Medications: Patient's Medications  New Prescriptions   No medications on file  Previous Medications   ACETAMINOPHEN (TYLENOL) 500 MG TABLET    Take 1,000 mg by mouth every 8 (eight) hours as needed.   ALBUTEROL (VENTOLIN HFA) 108 (90 BASE) MCG/ACT INHALER     TAKE 2 PUFFS BY MOUTH EVERY 6 HOURS AS NEEDED FOR WHEEZE OR SHORTNESS OF BREATH   AMLODIPINE (NORVASC) 10 MG TABLET    TAKE 1 TABLET BY MOUTH EVERY DAY   ASPIRIN LOW DOSE 81 MG EC TABLET    TAKE 1 TABLET BY MOUTH EVERY DAY   ATORVASTATIN (LIPITOR) 80 MG TABLET    Take 1 tablet (80 mg total) by mouth daily.   BESIVANCE 0.6 % SUSP    INSTILL ONE DROP INTO RIGHT EYE 4 TIMES A DAY FOR 2 DAYS AFTER EACH MONTHLY EYE INJECTION   BLOOD GLUCOSE METER KIT AND SUPPLIES KIT    Inject 1 each into the skin 2 (two) times daily. Dispense based on patient and insurance preference. Use up to four times daily as directed. (FOR ICD-9 250.00, 250.01).   BUDESONIDE-FORMOTEROL (SYMBICORT) 160-4.5 MCG/ACT INHALER    TAKE TWO PUFFS BY MOUTH TWICE DAILY   DICLOFENAC SODIUM (VOLTAREN) 1 % GEL    Apply 2 g topically 4 (four) times daily as needed (shoulder pain).   FLUTICASONE (FLONASE) 50 MCG/ACT NASAL SPRAY    SPRAY 2 SPRAYS INTO EACH NOSTRIL EVERY DAY   GLUCOSE BLOOD (ONE TOUCH ULTRA TEST) TEST STRIP    Use to test blood sugar twice daily.  DX: E11.9   HYDRALAZINE (APRESOLINE) 25 MG TABLET    TAKE 1 TABLET BY MOUTH TWICE A DAY   IPRATROPIUM-ALBUTEROL (DUONEB) 0.5-2.5 (3) MG/3ML SOLN    Take 3 mLs by nebulization every 6 (six) hours as needed (wheezing).   JANUVIA 25 MG TABLET    TAKE 1 TABLET BY MOUTH EVERY DAY   LANCET DEVICES (ONE TOUCH DELICA LANCING DEV) MISC    Use to test blood sugar once daily dx: E119   LANCETS (ONETOUCH ULTRASOFT) LANCETS    Use to test blood sugar up to three times daily. Dx E11.9   METOPROLOL TARTRATE (LOPRESSOR) 50 MG TABLET    TAKE 1 TABLET BY MOUTH TWICE A DAY   MONTELUKAST (SINGULAIR) 10 MG TABLET    TAKE 1 TABLET BY MOUTH AT BEDTIME   PAROXETINE (PAXIL) 30 MG TABLET    TAKE 1 TABLET BY MOUTH EVERY DAY   SODIUM CHLORIDE (OCEAN) 0.65 % SOLN NASAL SPRAY    Place 1 spray into both nostrils as needed for congestion.   TRAZODONE (DESYREL) 50 MG TABLET    TAKE 1 TABLET BY MOUTH EVERYDAY AT  BEDTIME   TRIMETHOPRIM-POLYMYXIN B (POLYTRIM) OPHTHALMIC SOLUTION    INSTILL ONE DROP INTO RIGHT EYE 4 TIMES A DAY FOR 2 DAYS AFTER EACH MONTHLY EYE INJECTION   VITAMIN D, ERGOCALCIFEROL, (DRISDOL) 1.25 MG (50000 UNIT) CAPS CAPSULE    Take 50,000 Units by mouth once a week.  Modified Medications   No medications on file  Discontinued Medications   ATORVASTATIN (LIPITOR) 40 MG TABLET    TAKE 1 TABLET BY MOUTH EVERY DAY   BETAMETHASONE DIPROPIONATE 0.05 % CREAM    Apply topically to affected area after bathing daily for the next 30 days   OVER THE COUNTER MEDICATION    1  application daily. BACTRIM CREAM    Physical Exam:  Vitals:   10/09/20 1100  BP: 122/70  Pulse: 66  Temp: (!) 96.9 F (36.1 C)  TempSrc: Temporal  SpO2: 94%  Weight: 176 lb 9.6 oz (80.1 kg)  Height: 5' 7"  (1.702 m)   Body mass index is 27.66 kg/m. Wt Readings from Last 3 Encounters:  10/09/20 176 lb 9.6 oz (80.1 kg)  05/25/20 187 lb 9.6 oz (85.1 kg)  03/30/20 188 lb (85.3 kg)    Physical Exam Constitutional:      General: He is not in acute distress.    Appearance: He is well-developed. He is not diaphoretic.  HENT:     Head: Normocephalic and atraumatic.     Mouth/Throat:     Pharynx: No oropharyngeal exudate.  Eyes:     Conjunctiva/sclera: Conjunctivae normal.     Pupils: Pupils are equal, round, and reactive to light.  Cardiovascular:     Rate and Rhythm: Normal rate and regular rhythm.     Heart sounds: Normal heart sounds.  Pulmonary:     Effort: Pulmonary effort is normal.     Breath sounds: Normal breath sounds.  Abdominal:     General: Bowel sounds are normal.     Palpations: Abdomen is soft.  Musculoskeletal:        General: No tenderness.     Cervical back: Normal range of motion and neck supple.  Skin:    General: Skin is warm and dry.  Neurological:     Mental Status: He is alert and oriented to person, place, and time.    Labs reviewed: Basic Metabolic Panel: Recent Labs     01/24/20 0909 03/10/20 0000 05/23/20 0821  NA 140 145 140  K 4.5 5.1 4.5  CL 108 110* 106  CO2 27 23* 25  GLUCOSE 98  --  120*  BUN 54* 50* 53*  CREATININE 2.64* 2.8* 2.90*  CALCIUM 9.5 9.8 9.8   Liver Function Tests: Recent Labs    03/10/20 0000  ALBUMIN 3.6   No results for input(s): LIPASE, AMYLASE in the last 8760 hours. No results for input(s): AMMONIA in the last 8760 hours. CBC: Recent Labs    01/24/20 0909  WBC 7.6  NEUTROABS 4,537  HGB 11.9*  HCT 36.3*  MCV 98.9  PLT 148   Lipid Panel: Recent Labs    05/23/20 0821  CHOL 173  HDL 42  LDLCALC 103*  TRIG 165*  CHOLHDL 4.1   TSH: No results for input(s): TSH in the last 8760 hours. A1C: Lab Results  Component Value Date   HGBA1C 6.0 (H) 05/23/2020     Assessment/Plan 1. COPD with asthma ([employer]) Rx for albuterol recently sent to pharmacy, continues on symbicort 2 puffs BID  2. Hyperlipidemia associated with type 2 diabetes mellitus ([place]) LDL not at goal on last labs, he was increased to Lipitor 80 mg daily after last labs, will follow up at this time - Lipid Panel; Future  3. Mild cognitive impairment with memory loss -stable, without worsening cognitive or functional status.    5. Type 2 diabetes mellitus with stage 4 chronic kidney disease, without long-term current use of insulin ([place]) -continues on januvia. Encouraged dietary compliance, routine foot care/monitoring and to keep up with diabetic eye exams through ophthalmology  - Hemoglobin A1c; Future  6. Essential hypertension -controlled on norvasc 10 mg daily, hydralazine, metoprolol   - COMPLETE METABOLIC PANEL WITH GFR; Future - CBC with Differential/Platelet; Future  7. Chronic diastolic CHF (congestive heart failure) ([place]) Stable at this time. Without worsening shortness of breath, swelling or chest pains.   8. Chronic kidney disease, stage 4 (severe) ([facility]) Encourage proper hydration and to avoid NSAIDS (Aleve, Advil, Motrin,  Ibuprofen). Dose adjust medication as needed. Continues to follow up with nephrologist.   9. Vitamin D deficiency - not on routine supplement - Vitamin D, 25-hydroxy; Future  Next appt: 4 months. Will follow up this week with lab work [employer]. [place], [facility] [phone]

## 2020-10-09 NOTE — Patient Instructions (Addendum)
Please sign record release for Dr Zigmund Daniel for diabetic eye exam  To make appt for this week for fasting blood work.

## 2020-10-10 ENCOUNTER — Other Ambulatory Visit: Payer: PPO

## 2020-10-10 ENCOUNTER — Other Ambulatory Visit: Payer: Self-pay

## 2020-10-10 DIAGNOSIS — E785 Hyperlipidemia, unspecified: Secondary | ICD-10-CM | POA: Diagnosis not present

## 2020-10-10 DIAGNOSIS — I10 Essential (primary) hypertension: Secondary | ICD-10-CM | POA: Diagnosis not present

## 2020-10-10 DIAGNOSIS — E1122 Type 2 diabetes mellitus with diabetic chronic kidney disease: Secondary | ICD-10-CM

## 2020-10-10 DIAGNOSIS — E559 Vitamin D deficiency, unspecified: Secondary | ICD-10-CM

## 2020-10-10 DIAGNOSIS — N1831 Chronic kidney disease, stage 3a: Secondary | ICD-10-CM

## 2020-10-10 DIAGNOSIS — E1169 Type 2 diabetes mellitus with other specified complication: Secondary | ICD-10-CM | POA: Diagnosis not present

## 2020-10-11 LAB — HEMOGLOBIN A1C
Hgb A1c MFr Bld: 5.8 % of total Hgb — ABNORMAL HIGH (ref <5.7)
Mean Plasma Glucose: 120 mg/dL
eAG (mmol/L): 6.6 mmol/L

## 2020-10-11 LAB — CBC WITH DIFFERENTIAL/PLATELET
Absolute Monocytes: 699 cells/uL (ref 200–950)
Basophils Absolute: 82 cells/uL (ref 0–200)
Basophils Relative: 1.3 %
Eosinophils Absolute: 410 cells/uL (ref 15–500)
Eosinophils Relative: 6.5 %
HCT: 37.1 % — ABNORMAL LOW (ref 38.5–50.0)
Hemoglobin: 12.2 g/dL — ABNORMAL LOW (ref 13.2–17.1)
Lymphs Abs: 1972 cells/uL (ref 850–3900)
MCH: 32.4 pg (ref 27.0–33.0)
MCHC: 32.9 g/dL (ref 32.0–36.0)
MCV: 98.7 fL (ref 80.0–100.0)
MPV: 10.3 fL (ref 7.5–12.5)
Monocytes Relative: 11.1 %
Neutro Abs: 3137 cells/uL (ref 1500–7800)
Neutrophils Relative %: 49.8 %
Platelets: 152 10*3/uL (ref 140–400)
RBC: 3.76 10*6/uL — ABNORMAL LOW (ref 4.20–5.80)
RDW: 13.3 % (ref 11.0–15.0)
Total Lymphocyte: 31.3 %
WBC: 6.3 10*3/uL (ref 3.8–10.8)

## 2020-10-11 LAB — COMPLETE METABOLIC PANEL WITH GFR
AG Ratio: 1.4 (calc) (ref 1.0–2.5)
ALT: 11 U/L (ref 9–46)
AST: 15 U/L (ref 10–35)
Albumin: 3.5 g/dL — ABNORMAL LOW (ref 3.6–5.1)
Alkaline phosphatase (APISO): 75 U/L (ref 35–144)
BUN/Creatinine Ratio: 18 (calc) (ref 6–22)
BUN: 51 mg/dL — ABNORMAL HIGH (ref 7–25)
CO2: 23 mmol/L (ref 20–32)
Calcium: 9.9 mg/dL (ref 8.6–10.3)
Chloride: 111 mmol/L — ABNORMAL HIGH (ref 98–110)
Creat: 2.89 mg/dL — ABNORMAL HIGH (ref 0.70–1.11)
GFR, Est African American: 22 mL/min/{1.73_m2} — ABNORMAL LOW (ref >=60)
GFR, Est Non African American: 19 mL/min/{1.73_m2} — ABNORMAL LOW (ref >=60)
Globulin: 2.5 g/dL (calc) (ref 1.9–3.7)
Glucose, Bld: 141 mg/dL — ABNORMAL HIGH (ref 65–99)
Potassium: 4.4 mmol/L (ref 3.5–5.3)
Sodium: 143 mmol/L (ref 135–146)
Total Bilirubin: 0.4 mg/dL (ref 0.2–1.2)
Total Protein: 6 g/dL — ABNORMAL LOW (ref 6.1–8.1)

## 2020-10-11 LAB — LIPID PANEL
Cholesterol: 164 mg/dL (ref <200)
HDL: 42 mg/dL (ref >=40)
LDL Cholesterol (Calc): 99 mg/dL (calc)
Non-HDL Cholesterol (Calc): 122 mg/dL (calc) (ref <130)
Total CHOL/HDL Ratio: 3.9 (calc) (ref <5.0)
Triglycerides: 129 mg/dL (ref <150)

## 2020-10-11 LAB — VITAMIN D 25 HYDROXY (VIT D DEFICIENCY, FRACTURES): Vit D, 25-Hydroxy: 27 ng/mL — ABNORMAL LOW (ref 30–100)

## 2020-10-13 ENCOUNTER — Other Ambulatory Visit: Payer: Self-pay | Admitting: Nurse Practitioner

## 2020-10-13 ENCOUNTER — Telehealth: Payer: Self-pay | Admitting: Nurse Practitioner

## 2020-10-13 DIAGNOSIS — E559 Vitamin D deficiency, unspecified: Secondary | ICD-10-CM

## 2020-10-13 MED ORDER — VITAMIN D (ERGOCALCIFEROL) 1.25 MG (50000 UNIT) PO CAPS
50000.0000 [IU] | ORAL_CAPSULE | ORAL | 0 refills | Status: DC
Start: 2020-10-13 — End: 2021-01-15

## 2020-10-13 NOTE — Progress Notes (Signed)
To take vit d 50,000 units for 12 weeks then to start OTC vit d 2000 units daily

## 2020-10-13 NOTE — Telephone Encounter (Signed)
error 

## 2020-10-19 NOTE — Progress Notes (Signed)
Spoke with [name] to advise on additional instructions to add OTC vit D 2000 units daily once 12 week prescription complete. [name] wrote instructions down and repeated back to me to confirm comprehension

## 2020-10-30 ENCOUNTER — Other Ambulatory Visit: Payer: Self-pay

## 2020-10-30 ENCOUNTER — Encounter (INDEPENDENT_AMBULATORY_CARE_PROVIDER_SITE_OTHER): Payer: PPO | Admitting: Ophthalmology

## 2020-10-30 DIAGNOSIS — H353122 Nonexudative age-related macular degeneration, left eye, intermediate dry stage: Secondary | ICD-10-CM | POA: Diagnosis not present

## 2020-10-30 DIAGNOSIS — H35033 Hypertensive retinopathy, bilateral: Secondary | ICD-10-CM

## 2020-10-30 DIAGNOSIS — I10 Essential (primary) hypertension: Secondary | ICD-10-CM | POA: Diagnosis not present

## 2020-10-30 DIAGNOSIS — H353211 Exudative age-related macular degeneration, right eye, with active choroidal neovascularization: Secondary | ICD-10-CM | POA: Diagnosis not present

## 2020-10-30 DIAGNOSIS — H43813 Vitreous degeneration, bilateral: Secondary | ICD-10-CM | POA: Diagnosis not present

## 2020-10-31 ENCOUNTER — Other Ambulatory Visit: Payer: Self-pay | Admitting: Family

## 2020-10-31 ENCOUNTER — Encounter: Payer: Self-pay | Admitting: Family

## 2020-10-31 ENCOUNTER — Telehealth: Payer: Self-pay

## 2020-10-31 ENCOUNTER — Ambulatory Visit (INDEPENDENT_AMBULATORY_CARE_PROVIDER_SITE_OTHER): Payer: PPO | Admitting: Family

## 2020-10-31 VITALS — BP 140/80 | HR 56 | Temp 98.4°F | Resp 16 | Ht 67.0 in

## 2020-10-31 DIAGNOSIS — R0989 Other specified symptoms and signs involving the circulatory and respiratory systems: Secondary | ICD-10-CM

## 2020-10-31 DIAGNOSIS — J0110 Acute frontal sinusitis, unspecified: Secondary | ICD-10-CM | POA: Diagnosis not present

## 2020-10-31 DIAGNOSIS — J069 Acute upper respiratory infection, unspecified: Secondary | ICD-10-CM | POA: Diagnosis not present

## 2020-10-31 LAB — POCT INFLUENZA A/B
Influenza A, POC: NEGATIVE
Influenza B, POC: NEGATIVE

## 2020-10-31 NOTE — Telephone Encounter (Signed)
Patient seen today but computer was down unable to call patient's son did not have patient's son number and patient had already gone.Ian [name],CMA will call patient's son in the morning to update on treatment plan.Patient treated for acute sinusitis Doxycyline 100 mg tablet twice daily x 7 days and was also advised to get OTC Loratadine 10 mg tablet daily x 7 days for runny nose.Lungs were clear no pneumonia.Encouraged to increase fluid intake to prevent dehydration.

## 2020-10-31 NOTE — Telephone Encounter (Signed)
Patient will be seen today by Webb Silversmith and patients son is asking that Dinah or her medical assistant call him after appointment with Dinah's treatment plan as his father does not hear well and does not always fully understand.  [name] is not available to join his father at today's appointment

## 2020-11-01 LAB — SARS-COV-2 RNA,(COVID-19) QUALITATIVE NAAT: SARS CoV2 RNA: NOT DETECTED

## 2020-11-01 MED ORDER — DOXYCYCLINE HYCLATE 100 MG PO TABS: 100.0000 mg | ORAL_TABLET | Freq: Two times a day (BID) | ORAL | 0 refills | Status: AC

## 2020-11-01 MED ORDER — LORATADINE 10 MG PO TABS: 10.0000 mg | ORAL_TABLET | Freq: Every day | ORAL | 0 refills | Status: DC

## 2020-11-01 NOTE — Telephone Encounter (Signed)
Patient son called back and spoke to me. Advised patient son "Alessandro Prisock" about care plan and notified him we are still waiting for Covid Test results. Patient son notified as well that flu test is negative.

## 2020-11-01 NOTE — Patient Instructions (Addendum)
-   doxycycline (VIBRA-TABS) 100 MG tablet; Take 1 tablet (100 mg total) by mouth 2 (two) times daily for 7 days.  Dispense: 14 tablet; Refill: 0  - loratadine (CLARITIN) 10 MG tablet; Take 1 tablet (10 mg total) by mouth daily.  Dispense: 30 tablet; Refill: 0  - Increase water intake to at least 6-8 glasses daily  - Notify provider's office or go to ED if symptoms worsen or fail to improve

## 2020-11-01 NOTE — Progress Notes (Addendum)
Provider:   FNP-C  Lauree Chandler, NP  Patient Care Team: Lauree Chandler, NP as PCP - General (Geriatric Medicine) Zebedee Iba., MD as Referring Physician (Ophthalmology) Chesley Mires, MD as Consulting Physician (Pulmonary Disease) Lavonna Monarch, MD as Consulting Physician (Dermatology)  Extended Emergency Contact Information Primary Emergency Contact: Akre,Mike Mobile Phone: [phone] Relation: Son Secondary Emergency Contact: Schamp,Matt  [place] Mobile Phone: [phone] Relation: Son  Code Status:  DNR Goals of care: Advanced Directive information Advanced Directives 10/31/2020  Does Patient Have a Medical Advance Directive? Yes  Type of Paramedic of [place];Living will  Does patient want to make changes to medical advance directive? No - Patient declined  Copy of [place] in Chart? Yes - validated most recent copy scanned in chart (See row information)  Would patient like information on creating a medical advance directive? -  Pre-existing out of facility DNR order (yellow form or pink MOST form) -     Chief Complaint  Patient presents with  . Acute Visit    Headache, Low grade fever, fever, and cough 4-5 days.    HPI:  Pt is a 85 y.o. male seen today for an acute visit for evaluation of cough,runny nose,sore throat and sneezing x 4-5 days.describes cough as dry.Appetite is not good but just eats every now and then.He denies any fever,chills,N/V/D,loss of sense of taste or smell.Has not been in contact with sick person with COVID-19 infections. Has not taken any medication for the symptoms.     Past Medical History:  Diagnosis Date  . Allergy   . Alzheimer's disease ([place])   . Anemia, unspecified   . Anxiety   . Chronic maxillary sinusitis   . COPD (chronic obstructive pulmonary disease) ([place])   . Depression   . Diabetes mellitus   . Edema   . Hypercalcemia   .  Hyperlipidemia   . Hypertension   . Hypertrophy of prostate without urinary obstruction and other lower urinary tract symptoms (LUTS)   . Hypertrophy of prostate without urinary obstruction and other lower urinary tract symptoms (LUTS)   . Kidney disease    mild  . Unspecified disorder of kidney and ureter   . Unspecified sleep apnea   . Ventral hernia, unspecified, without mention of obstruction or gangrene    Past Surgical History:  Procedure Laterality Date  . EXPLORATORY LAPAROTOMY  2002  . HERNIA REPAIR      Allergies  Allergen Reactions  . Ace Inhibitors Cough    Outpatient Encounter Medications as of 10/31/2020  Medication Sig  . acetaminophen (TYLENOL) 500 MG tablet Take 1,000 mg by mouth every 8 (eight) hours as needed.  Marland Kitchen albuterol (VENTOLIN HFA) 108 (90 Base) MCG/ACT inhaler TAKE 2 PUFFS BY MOUTH EVERY 6 HOURS AS NEEDED FOR WHEEZE OR SHORTNESS OF BREATH  . amLODipine (NORVASC) 10 MG tablet TAKE 1 TABLET BY MOUTH EVERY DAY  . ASPIRIN LOW DOSE 81 MG EC tablet TAKE 1 TABLET BY MOUTH EVERY DAY  . atorvastatin (LIPITOR) 80 MG tablet Take 1 tablet (80 mg total) by mouth daily.  Marland Kitchen BESIVANCE 0.6 % SUSP INSTILL ONE DROP INTO RIGHT EYE 4 TIMES A DAY FOR 2 DAYS AFTER EACH MONTHLY EYE INJECTION  . blood glucose meter kit and supplies KIT Inject 1 each into the skin 2 (two) times daily. Dispense based on patient and insurance preference. Use up to four times daily as directed. (FOR ICD-9 250.00, 250.01).  . budesonide-formoterol (  SYMBICORT) 160-4.5 MCG/ACT inhaler TAKE TWO PUFFS BY MOUTH TWICE DAILY  . diclofenac sodium (VOLTAREN) 1 % GEL Apply 2 g topically 4 (four) times daily as needed (shoulder pain).  . fluticasone (FLONASE) 50 MCG/ACT nasal spray SPRAY 2 SPRAYS INTO EACH NOSTRIL EVERY DAY  . glucose blood (ONE TOUCH ULTRA TEST) test strip Use to test blood sugar twice daily.  DX: E11.9  . hydrALAZINE (APRESOLINE) 25 MG tablet TAKE 1 TABLET BY MOUTH TWICE A DAY  .  ipratropium-albuterol (DUONEB) 0.5-2.5 (3) MG/3ML SOLN Take 3 mLs by nebulization every 6 (six) hours as needed (wheezing).  Marland Kitchen JANUVIA 25 MG tablet TAKE 1 TABLET BY MOUTH EVERY DAY  . Lancet Devices (ONE TOUCH DELICA LANCING DEV) MISC Use to test blood sugar once daily dx: E119  . Lancets (ONETOUCH ULTRASOFT) lancets Use to test blood sugar up to three times daily. Dx E11.9  . metoprolol tartrate (LOPRESSOR) 50 MG tablet TAKE 1 TABLET BY MOUTH TWICE A DAY  . montelukast (SINGULAIR) 10 MG tablet TAKE 1 TABLET BY MOUTH AT BEDTIME  . PARoxetine (PAXIL) 30 MG tablet TAKE 1 TABLET BY MOUTH EVERY DAY  . sodium chloride (OCEAN) 0.65 % SOLN nasal spray Place 1 spray into both nostrils as needed for congestion.  . traZODone (DESYREL) 50 MG tablet TAKE 1 TABLET BY MOUTH EVERYDAY AT BEDTIME  . trimethoprim-polymyxin b (POLYTRIM) ophthalmic solution INSTILL ONE DROP INTO RIGHT EYE 4 TIMES A DAY FOR 2 DAYS AFTER EACH MONTHLY EYE INJECTION  . Vitamin D, Ergocalciferol, (DRISDOL) 1.25 MG (50000 UNIT) CAPS capsule Take 1 capsule (50,000 Units total) by mouth once a week.   No facility-administered encounter medications on file as of 10/31/2020.    Review of Systems  Constitutional: Negative for appetite change, chills, fatigue, fever and unexpected weight change.  HENT: Positive for rhinorrhea, sneezing and sore throat. Negative for congestion, dental problem, ear discharge, ear pain, facial swelling, hearing loss, nosebleeds, postnasal drip, sinus pressure, sinus pain, tinnitus and trouble swallowing.   Eyes: Negative for pain, discharge, redness, itching and visual disturbance.  Respiratory: Positive for cough. Negative for chest tightness, shortness of breath and wheezing.   Cardiovascular: Negative for chest pain, palpitations and leg swelling.  Gastrointestinal: Negative for abdominal distention, abdominal pain, blood in stool, constipation, diarrhea, nausea and vomiting.  Musculoskeletal: Negative for  arthralgias, back pain, gait problem, joint swelling, myalgias, neck pain and neck stiffness.  Skin: Negative for color change, pallor, rash and wound.  Neurological: Negative for dizziness, syncope, speech difficulty, weakness, light-headedness, numbness and headaches.  Hematological: Does not bruise/bleed easily.  Psychiatric/Behavioral: Negative for agitation, behavioral problems, confusion, hallucinations, self-injury, sleep disturbance and suicidal ideas. The patient is not nervous/anxious.     Immunization History  Administered Date(s) Administered  . Fluad Quad(high Dose 65+) 05/27/2019, 03/30/2020  . Influenza Split 04/08/2011  . Influenza Whole 04/07/2012  . Influenza, High Dose Seasonal PF 04/07/2017, 05/25/2018, 07/22/2019  . Influenza,inj,Quad PF,6+ Mos 04/23/2013, 03/28/2014, 02/23/2016  . Influenza-Unspecified 04/08/2015, 07/09/2015, 04/08/2017  . Moderna Sars-Covid-2 Vaccination 10/07/2019, 11/07/2019, 05/10/2020  . Pneumococcal Conjugate-13 06/06/2014  . Pneumococcal-Unspecified 07/09/2007  . Td 08/09/1997  . Tdap 08/12/2011   Pertinent  Health Maintenance Due  Topic Date Due  . FOOT EXAM  05/26/2020  . OPHTHALMOLOGY EXAM  09/27/2020  . INFLUENZA VACCINE  02/05/2021  . HEMOGLOBIN A1C  04/11/2021  . PNA vac Low Risk Adult  Completed   Fall Risk  10/31/2020 10/09/2020 09/08/2020 05/25/2020 03/06/2020  Falls in the past year?  0 0 0 0 0  Number falls in past yr: 0 0 0 0 0  Injury with Fall? 0 0 0 0 0   Functional Status Survey:    Vitals:   10/31/20 1649  BP: 140/80  Pulse: (!) 56  Resp: 16  Temp: 98.4 F (36.9 C)  SpO2: 92%  Height: 5' 7" (1.702 m)   Body mass index is 27.66 kg/m. Physical Exam Vitals reviewed.  Constitutional:      General: He is not in acute distress.    Appearance: Normal appearance. He is normal weight. He is not ill-appearing or diaphoretic.  HENT:     Head: Normocephalic.     Right Ear: Tympanic membrane, ear canal and external ear  normal. There is no impacted cerumen.     Left Ear: Tympanic membrane, ear canal and external ear normal. There is no impacted cerumen.     Nose: Congestion and rhinorrhea present.     Right Turbinates: Not enlarged or swollen.     Left Turbinates: Not enlarged or swollen.     Right Sinus: Frontal sinus tenderness present. No maxillary sinus tenderness.     Left Sinus: Frontal sinus tenderness present. No maxillary sinus tenderness.     Mouth/Throat:     Mouth: Mucous membranes are moist.     Pharynx: Oropharynx is clear. No oropharyngeal exudate or posterior oropharyngeal erythema.  Eyes:     General: No scleral icterus.       Right eye: No discharge.        Left eye: No discharge.     Extraocular Movements: Extraocular movements intact.     Conjunctiva/sclera: Conjunctivae normal.     Pupils: Pupils are equal, round, and reactive to light.  Neck:     Vascular: No carotid bruit.  Cardiovascular:     Rate and Rhythm: Normal rate and regular rhythm.     Pulses: Normal pulses.     Heart sounds: Normal heart sounds. No murmur heard. No friction rub. No gallop.   Pulmonary:     Effort: Pulmonary effort is normal. No respiratory distress.     Breath sounds: Normal breath sounds. No wheezing, rhonchi or rales.  Chest:     Chest wall: No tenderness.  Abdominal:     General: Bowel sounds are normal. There is no distension.     Palpations: Abdomen is soft. There is no mass.     Tenderness: There is no abdominal tenderness. There is no right CVA tenderness, left CVA tenderness, guarding or rebound.  Musculoskeletal:        General: No swelling or tenderness. Normal range of motion.     Cervical back: Normal range of motion. No rigidity or tenderness.     Right lower leg: No edema.     Left lower leg: No edema.  Lymphadenopathy:     Cervical: No cervical adenopathy.  Skin:    General: Skin is warm and dry.     Coloration: Skin is not pale.     Findings: No bruising, erythema, lesion  or rash.  Neurological:     Mental Status: He is alert and oriented to person, place, and time.     Cranial Nerves: No cranial nerve deficit.     Sensory: No sensory deficit.     Motor: No weakness.     Coordination: Coordination normal.     Gait: Gait normal.  Psychiatric:        Mood and Affect: Mood normal.  Speech: Speech normal.        Behavior: Behavior normal.        Thought Content: Thought content normal.        Judgment: Judgment normal.     Labs reviewed: Recent Labs    01/24/20 0909 03/10/20 0000 05/23/20 0821 10/10/20 0926  NA 140 145 140 143  K 4.5 5.1 4.5 4.4  CL 108 110* 106 111*  CO2 27 23* 25 23  GLUCOSE 98  --  120* 141*  BUN 54* 50* 53* 51*  CREATININE 2.64* 2.8* 2.90* 2.89*  CALCIUM 9.5 9.8 9.8 9.9   Recent Labs    03/10/20 0000 10/10/20 0926  AST  --  15  ALT  --  11  BILITOT  --  0.4  PROT  --  6.0*  ALBUMIN 3.6  --    Recent Labs    01/24/20 0909 10/10/20 0926  WBC 7.6 6.3  NEUTROABS 4,537 3,137  HGB 11.9* 12.2*  HCT 36.3* 37.1*  MCV 98.9 98.7  PLT 148 152   No results found for: TSH Lab Results  Component Value Date   HGBA1C 5.8 (H) 10/10/2020   Lab Results  Component Value Date   CHOL 164 10/10/2020   HDL 42 10/10/2020   LDLCALC 99 10/10/2020   TRIG 129 10/10/2020   CHOLHDL 3.9 10/10/2020    Significant Diagnostic Results in last 30 days:  No results found.  Assessment/Plan  1. Symptoms of upper respiratory infection (URI) Afebrile.presenting symptoms similar to other URI will rule out COVID-19  - POC Influenza A/B results negative  - Made aware COVID-19 test results will be back in 3 days then will call with results. - SARS-COV-2 RNA,(COVID-19) QUAL NAAT - loratadine (CLARITIN) 10 MG tablet; Take 1 tablet (10 mg total) by mouth daily.  Dispense: 30 tablet; Refill: 0  2. Acute non-recurrent frontal sinusitis Afebrile.Yellow thick nasal drainage noted. Frontal sinus tenderness with percussion. -  encouraged to increase water intake to at least 6-8 glasses daily  - start on doxycyline as below.Discussed use, risks and benefits of the medication verbalized understanding.Script written and given to patient to take to pharmacy due to office Internet /computer being down unble to e-scribe.   - advised to notify provider's office or go to ED if symptoms worsen or fail to improve  - doxycycline (VIBRA-TABS) 100 MG tablet; Take 1 tablet (100 mg total) by mouth 2 (two) times daily for 7 days.  Dispense: 14 tablet; Refill: 0 - loratadine (CLARITIN) 10 MG tablet; Take 1 tablet (10 mg total) by mouth daily.  Dispense: 30 tablet; Refill: 0  Family/ staff Communication: Reviewed plan of care with patient verbalized understanding  Labs/tests ordered:  - SARS-COV-2 RNA,(COVID-19) QUAL NAAT - POC Influenza A/B  Next Appointment: As needed if symptoms worsen or fail to improve   Sandrea Hughs, NP

## 2020-11-01 NOTE — Telephone Encounter (Signed)
I called patient son "[name]" and voicemail was left to call the office back and speak to me about dad "Ian [name]" appointment yesterday 10/31/2020.

## 2020-11-03 ENCOUNTER — Telehealth: Payer: Self-pay | Admitting: *Deleted

## 2020-11-03 NOTE — Telephone Encounter (Signed)
Patient notified. Patient stated to leave message on voicemail if he didn't answer. Message left with Jessica's recommendation.

## 2020-11-03 NOTE — Telephone Encounter (Signed)
Yes I would recommend starting mucinex by mouth twice daily with full glass of water.

## 2020-11-03 NOTE — Telephone Encounter (Signed)
Patient called and stated that he is having alittle bit of chest tightness that started 3 days ago. No other symptoms noted. No arm pain and states breathing is good. No fever.  Has some congestion that is clear.  Patient is wondering if he needs to take some Mucinex.  Please Advise.

## 2020-11-13 DIAGNOSIS — I129 Hypertensive chronic kidney disease with stage 1 through stage 4 chronic kidney disease, or unspecified chronic kidney disease: Secondary | ICD-10-CM | POA: Diagnosis not present

## 2020-11-13 DIAGNOSIS — N2581 Secondary hyperparathyroidism of renal origin: Secondary | ICD-10-CM | POA: Diagnosis not present

## 2020-11-13 DIAGNOSIS — N184 Chronic kidney disease, stage 4 (severe): Secondary | ICD-10-CM | POA: Diagnosis not present

## 2020-11-23 ENCOUNTER — Other Ambulatory Visit: Payer: Self-pay | Admitting: *Deleted

## 2020-11-23 DIAGNOSIS — E1122 Type 2 diabetes mellitus with diabetic chronic kidney disease: Secondary | ICD-10-CM

## 2020-11-23 DIAGNOSIS — N1831 Chronic kidney disease, stage 3a: Secondary | ICD-10-CM

## 2020-11-23 MED ORDER — MONTELUKAST SODIUM 10 MG PO TABS: 1.0000 | ORAL_TABLET | Freq: Every day | ORAL | 6 refills | Status: DC

## 2020-11-23 MED ORDER — ASPIRIN 81 MG PO TBEC: 81.0000 mg | DELAYED_RELEASE_TABLET | Freq: Every day | ORAL | 6 refills | Status: DC

## 2020-11-23 MED ORDER — HYDRALAZINE HCL 25 MG PO TABS: 25.0000 mg | ORAL_TABLET | Freq: Two times a day (BID) | ORAL | 5 refills | Status: DC

## 2020-11-23 NOTE — Addendum Note (Signed)
Addended by: Rafael Bihari A on: 11/23/2020 03:41 PM   Modules accepted: Orders

## 2020-11-23 NOTE — Telephone Encounter (Signed)
[facility] Requested refill.

## 2020-11-27 ENCOUNTER — Encounter (INDEPENDENT_AMBULATORY_CARE_PROVIDER_SITE_OTHER): Payer: PPO | Admitting: Ophthalmology

## 2020-12-06 ENCOUNTER — Other Ambulatory Visit: Payer: Self-pay

## 2020-12-06 ENCOUNTER — Encounter (INDEPENDENT_AMBULATORY_CARE_PROVIDER_SITE_OTHER): Payer: PPO | Admitting: Ophthalmology

## 2020-12-06 DIAGNOSIS — H353122 Nonexudative age-related macular degeneration, left eye, intermediate dry stage: Secondary | ICD-10-CM

## 2020-12-06 DIAGNOSIS — H35033 Hypertensive retinopathy, bilateral: Secondary | ICD-10-CM | POA: Diagnosis not present

## 2020-12-06 DIAGNOSIS — H348322 Tributary (branch) retinal vein occlusion, left eye, stable: Secondary | ICD-10-CM | POA: Diagnosis not present

## 2020-12-06 DIAGNOSIS — I10 Essential (primary) hypertension: Secondary | ICD-10-CM

## 2020-12-06 DIAGNOSIS — H43813 Vitreous degeneration, bilateral: Secondary | ICD-10-CM

## 2020-12-06 DIAGNOSIS — H353211 Exudative age-related macular degeneration, right eye, with active choroidal neovascularization: Secondary | ICD-10-CM | POA: Diagnosis not present

## 2020-12-22 ENCOUNTER — Other Ambulatory Visit: Payer: Self-pay

## 2020-12-22 ENCOUNTER — Ambulatory Visit (INDEPENDENT_AMBULATORY_CARE_PROVIDER_SITE_OTHER): Payer: PPO | Admitting: Family

## 2020-12-22 VITALS — BP 139/70 | HR 67 | Temp 97.3°F | Ht 67.0 in | Wt 173.4 lb

## 2020-12-22 DIAGNOSIS — R059 Cough, unspecified: Secondary | ICD-10-CM

## 2020-12-22 MED ORDER — DOXYCYCLINE HYCLATE 100 MG PO TABS: 100.0000 mg | ORAL_TABLET | Freq: Two times a day (BID) | ORAL | 0 refills | Status: AC

## 2020-12-22 NOTE — Patient Instructions (Signed)
-   notify provider or go to ED  if symptoms worsen or fail to improve.

## 2020-12-22 NOTE — Progress Notes (Signed)
Provider: Allison Silva FNP-C  Lauree Chandler, NP  Patient Care Team: Lauree Chandler, NP as PCP - General (Geriatric Medicine) Zebedee Iba., MD as Referring Physician (Ophthalmology) Chesley Mires, MD as Consulting Physician (Pulmonary Disease) Lavonna Monarch, MD as Consulting Physician (Dermatology)  Extended Emergency Contact Information Primary Emergency Contact: Trang,Mike Mobile Phone: [phone] Relation: Son Secondary Emergency Contact: Colella,Matt  [place] Mobile Phone: [phone] Relation: Son  Code Status:  DNR Goals of care: Advanced Directive information Advanced Directives 10/31/2020  Does Patient Have a Medical Advance Directive? Yes  Type of Paramedic of [place];Living will  Does patient want to make changes to medical advance directive? No - Patient declined  Copy of [place] in Chart? Yes - validated most recent copy scanned in chart (See row information)  Would patient like information on creating a medical advance directive? -  Pre-existing out of facility DNR order (yellow form or pink MOST form) -     Chief Complaint  Patient presents with   Acute Visit    Bad cough and mucus. Patient has been taking Mucinex. Patient has had symptoms for about 2 to 3 weeks.    HPI:  Pt is a 85 y.o. male seen today for an acute visit for evaluation of cough x 2-3 weeks.coughs up whitish mucus.Has used mucinex he denies any fever,chills,chest tightness,chest pain or shortness.Has no contact with sick person with COVID-19 lives alone.    Past Medical History:  Diagnosis Date   Allergy    Alzheimer's disease ([facility])    Anemia, unspecified    Anxiety    Chronic maxillary sinusitis    COPD (chronic obstructive pulmonary disease) (HCC)    Depression    Diabetes mellitus    Edema    Hypercalcemia    Hyperlipidemia    Hypertension    Hypertrophy of prostate without urinary obstruction and  other lower urinary tract symptoms (LUTS)    Hypertrophy of prostate without urinary obstruction and other lower urinary tract symptoms (LUTS)    Kidney disease    mild   Unspecified disorder of kidney and ureter    Unspecified sleep apnea    Ventral hernia, unspecified, without mention of obstruction or gangrene    Past Surgical History:  Procedure Laterality Date   EXPLORATORY LAPAROTOMY  2002   HERNIA REPAIR      Allergies  Allergen Reactions   Ace Inhibitors Cough    Outpatient Encounter Medications as of 12/22/2020  Medication Sig   acetaminophen (TYLENOL) 500 MG tablet Take 1,000 mg by mouth every 8 (eight) hours as needed.   albuterol (VENTOLIN HFA) 108 (90 Base) MCG/ACT inhaler TAKE 2 PUFFS BY MOUTH EVERY 6 HOURS AS NEEDED FOR WHEEZE OR SHORTNESS OF BREATH   amLODipine (NORVASC) 10 MG tablet TAKE 1 TABLET BY MOUTH EVERY DAY   aspirin (ASPIRIN LOW DOSE) 81 MG EC tablet Take 1 tablet (81 mg total) by mouth daily. Swallow whole.   atorvastatin (LIPITOR) 80 MG tablet Take 1 tablet (80 mg total) by mouth daily.   BESIVANCE 0.6 % SUSP INSTILL ONE DROP INTO RIGHT EYE 4 TIMES A DAY FOR 2 DAYS AFTER EACH MONTHLY EYE INJECTION   blood glucose meter kit and supplies KIT Inject 1 each into the skin 2 (two) times daily. Dispense based on patient and insurance preference. Use up to four times daily as directed. (FOR ICD-9 250.00, 250.01).   budesonide-formoterol (SYMBICORT) 160-4.5 MCG/ACT inhaler TAKE TWO PUFFS  BY MOUTH TWICE DAILY   diclofenac sodium (VOLTAREN) 1 % GEL Apply 2 g topically 4 (four) times daily as needed (shoulder pain).   fluticasone (FLONASE) 50 MCG/ACT nasal spray SPRAY 2 SPRAYS INTO EACH NOSTRIL EVERY DAY   glucose blood (ONE TOUCH ULTRA TEST) test strip Use to test blood sugar twice daily.  DX: E11.9   hydrALAZINE (APRESOLINE) 25 MG tablet Take 1 tablet (25 mg total) by mouth 2 (two) times daily.   ipratropium-albuterol (DUONEB) 0.5-2.5 (3) MG/3ML SOLN Take 3 mLs by  nebulization every 6 (six) hours as needed (wheezing).   JANUVIA 25 MG tablet TAKE 1 TABLET BY MOUTH EVERY DAY   Lancet Devices (ONE TOUCH DELICA LANCING DEV) MISC Use to test blood sugar once daily dx: E119   Lancets (ONETOUCH ULTRASOFT) lancets Use to test blood sugar up to three times daily. Dx E11.9   loratadine (CLARITIN) 10 MG tablet Take 1 tablet (10 mg total) by mouth daily.   metoprolol tartrate (LOPRESSOR) 50 MG tablet TAKE 1 TABLET BY MOUTH TWICE A DAY   montelukast (SINGULAIR) 10 MG tablet Take 1 tablet (10 mg total) by mouth at bedtime.   PARoxetine (PAXIL) 30 MG tablet TAKE 1 TABLET BY MOUTH EVERY DAY   sodium chloride (OCEAN) 0.65 % SOLN nasal spray Place 1 spray into both nostrils as needed for congestion.   traZODone (DESYREL) 50 MG tablet TAKE 1 TABLET BY MOUTH EVERYDAY AT BEDTIME   trimethoprim-polymyxin b (POLYTRIM) ophthalmic solution INSTILL ONE DROP INTO RIGHT EYE 4 TIMES A DAY FOR 2 DAYS AFTER EACH MONTHLY EYE INJECTION   Vitamin D, Ergocalciferol, (DRISDOL) 1.25 MG (50000 UNIT) CAPS capsule Take 1 capsule (50,000 Units total) by mouth once a week.   No facility-administered encounter medications on file as of 12/22/2020.    Review of Systems  Constitutional:  Negative for appetite change, chills, fatigue, fever and unexpected weight change.  HENT:  Negative for congestion, dental problem, ear discharge, ear pain, facial swelling, hearing loss, nosebleeds, postnasal drip, rhinorrhea, sinus pressure, sinus pain, sneezing, sore throat, tinnitus and trouble swallowing.   Eyes:  Negative for pain, discharge, redness, itching and visual disturbance.  Respiratory:  Positive for cough. Negative for chest tightness, shortness of breath and wheezing.   Cardiovascular:  Negative for chest pain, palpitations and leg swelling.  Gastrointestinal:  Negative for abdominal distention, abdominal pain, blood in stool, constipation, diarrhea, nausea and vomiting.  Skin:  Negative for color  change, pallor, rash and wound.  Neurological:  Negative for dizziness, weakness, light-headedness, numbness and headaches.  Psychiatric/Behavioral:  Negative for agitation, behavioral problems, confusion, hallucinations, self-injury, sleep disturbance and suicidal ideas. The patient is not nervous/anxious.    Immunization History  Administered Date(s) Administered   Fluad Quad(high Dose 65+) 05/27/2019, 03/30/2020   Influenza Split 04/08/2011   Influenza Whole 04/07/2012   Influenza, High Dose Seasonal PF 04/07/2017, 05/25/2018, 07/22/2019   Influenza,inj,Quad PF,6+ Mos 04/23/2013, 03/28/2014, 02/23/2016   Influenza-Unspecified 04/08/2015, 07/09/2015, 04/08/2017   Moderna Sars-Covid-2 Vaccination 10/07/2019, 11/07/2019, 05/10/2020   Pneumococcal Conjugate-13 06/06/2014   Pneumococcal-Unspecified 07/09/2007   Td 08/09/1997   Tdap 08/12/2011   Pertinent  Health Maintenance Due  Topic Date Due   FOOT EXAM  05/26/2020   OPHTHALMOLOGY EXAM  09/27/2020   INFLUENZA VACCINE  02/05/2021   HEMOGLOBIN A1C  04/11/2021   PNA vac Low Risk Adult  Completed   Fall Risk  10/31/2020 10/09/2020 09/08/2020 05/25/2020 03/06/2020  Falls in the past year? 0 0 0 0 0  Number falls in past yr: 0 0 0 0 0  Injury with Fall? 0 0 0 0 0   Functional Status Survey:    Vitals:   12/22/20 1403  BP: 139/70  Pulse: 67  Temp: (!) 97.3 F (36.3 C)  SpO2: 91%  Weight: 173 lb 6.4 oz (78.7 kg)  Height: 5' 7"  (1.702 m)   Body mass index is 27.16 kg/m. Physical Exam Vitals reviewed.  Constitutional:      General: He is not in acute distress.    Appearance: Normal appearance. He is overweight. He is not ill-appearing or diaphoretic.  HENT:     Head: Normocephalic.     Right Ear: Tympanic membrane, ear canal and external ear normal. There is no impacted cerumen.     Left Ear: Tympanic membrane, ear canal and external ear normal. There is no impacted cerumen.     Ears:     Comments: Left hearing aid in place      Nose: Nose normal. No congestion or rhinorrhea.     Mouth/Throat:     Mouth: Mucous membranes are moist.     Pharynx: Oropharynx is clear. No oropharyngeal exudate or posterior oropharyngeal erythema.  Eyes:     General: No scleral icterus.       Right eye: No discharge.        Left eye: No discharge.     Extraocular Movements: Extraocular movements intact.     Conjunctiva/sclera: Conjunctivae normal.     Pupils: Pupils are equal, round, and reactive to light.  Neck:     Vascular: No carotid bruit.  Cardiovascular:     Rate and Rhythm: Normal rate and regular rhythm.     Pulses: Normal pulses.     Heart sounds: Normal heart sounds. No murmur heard.   No friction rub. No gallop.  Pulmonary:     Effort: Pulmonary effort is normal. No respiratory distress.     Breath sounds: No wheezing or rhonchi.     Comments: Coarse breath sound on left lung clears with cough Chest:     Chest wall: No tenderness.  Abdominal:     General: Bowel sounds are normal. There is no distension.     Palpations: Abdomen is soft. There is no mass.     Tenderness: There is no abdominal tenderness. There is no right CVA tenderness, left CVA tenderness, guarding or rebound.     Hernia: A hernia is present.  Musculoskeletal:        General: No swelling or tenderness. Normal range of motion.     Cervical back: Normal range of motion. No rigidity or tenderness.     Right lower leg: No edema.     Left lower leg: No edema.  Lymphadenopathy:     Cervical: No cervical adenopathy.  Skin:    General: Skin is warm and dry.     Coloration: Skin is not pale.     Findings: No bruising, erythema, lesion or rash.  Neurological:     Mental Status: He is alert and oriented to person, place, and time.     Motor: No weakness.     Gait: Gait normal.  Psychiatric:        Mood and Affect: Mood normal.        Speech: Speech normal.        Behavior: Behavior normal.        Thought Content: Thought content normal.         Judgment: Judgment normal.  Labs reviewed: Recent Labs    01/24/20 0909 03/10/20 0000 05/23/20 0821 10/10/20 0926  NA 140 145 140 143  K 4.5 5.1 4.5 4.4  CL 108 110* 106 111*  CO2 27 23* 25 23  GLUCOSE 98  --  120* 141*  BUN 54* 50* 53* 51*  CREATININE 2.64* 2.8* 2.90* 2.89*  CALCIUM 9.5 9.8 9.8 9.9   Recent Labs    03/10/20 0000 10/10/20 0926  AST  --  15  ALT  --  11  BILITOT  --  0.4  PROT  --  6.0*  ALBUMIN 3.6  --    Recent Labs    01/24/20 0909 10/10/20 0926  WBC 7.6 6.3  NEUTROABS 4,537 3,137  HGB 11.9* 12.2*  HCT 36.3* 37.1*  MCV 98.9 98.7  PLT 148 152   No results found for: TSH Lab Results  Component Value Date   HGBA1C 5.8 (H) 10/10/2020   Lab Results  Component Value Date   CHOL 164 10/10/2020   HDL 42 10/10/2020   LDLCALC 99 10/10/2020   TRIG 129 10/10/2020   CHOLHDL 3.9 10/10/2020    Significant Diagnostic Results in last 30 days:  No results found.  Assessment/Plan  Cough Afebrile.left lung coarse breath sounds clears up with coughing.No wheezing or shortness of breath noted.due to on going duration of cough will treat clinically with antibiotics as below.SE discussed.  - continue on mucinex  - doxycycline (VIBRA-TABS) 100 MG tablet; Take 1 tablet (100 mg total) by mouth 2 (two) times daily for 10 days.  Dispense: 20 tablet; Refill: 0   Family/ staff Communication: Reviewed plan of care with patient verbalized understanding.   Labs/tests ordered: None   Next Appointment: As needed if symptoms worsen or fail to improve    Sandrea Hughs, NP

## 2020-12-23 ENCOUNTER — Other Ambulatory Visit: Payer: Self-pay | Admitting: Nurse Practitioner

## 2020-12-23 DIAGNOSIS — J449 Chronic obstructive pulmonary disease, unspecified: Secondary | ICD-10-CM

## 2020-12-27 ENCOUNTER — Other Ambulatory Visit: Payer: Self-pay

## 2020-12-27 ENCOUNTER — Ambulatory Visit
Admission: RE | Admit: 2020-12-27 | Discharge: 2020-12-27 | Disposition: A | Discharge status: Home / Self Care | Payer: PPO | Source: Ambulatory Visit | Attending: Family | Admitting: Family

## 2020-12-27 ENCOUNTER — Telehealth: Payer: Self-pay

## 2020-12-27 DIAGNOSIS — R0989 Other specified symptoms and signs involving the circulatory and respiratory systems: Secondary | ICD-10-CM

## 2020-12-27 DIAGNOSIS — R059 Cough, unspecified: Secondary | ICD-10-CM

## 2020-12-27 NOTE — Telephone Encounter (Signed)
Recommend chest X-ray to evaluate cough.

## 2020-12-27 NOTE — Telephone Encounter (Signed)
Noted  

## 2020-12-27 NOTE — Telephone Encounter (Signed)
Spoke with patient, patient is in agreement with recommendation and states he is going to [facility] now.   Order placed

## 2020-12-27 NOTE — Telephone Encounter (Signed)
Patient called stating he still has productive cough (clear) and runny nose (clear drainage). Patient states the medications that Dinah prescribed are not helping and questions if he can get something different.  Patient denies fever, sore throat, or SOB. Pharmacy confirmed as [facility] on [address].  Please advise

## 2021-01-02 ENCOUNTER — Ambulatory Visit (INDEPENDENT_AMBULATORY_CARE_PROVIDER_SITE_OTHER): Payer: PPO | Admitting: Family

## 2021-01-02 ENCOUNTER — Other Ambulatory Visit: Payer: Self-pay

## 2021-01-02 ENCOUNTER — Encounter: Payer: Self-pay | Admitting: Family

## 2021-01-02 VITALS — BP 108/60 | HR 59 | Temp 97.3°F | Resp 16 | Ht 67.0 in | Wt 173.4 lb

## 2021-01-02 DIAGNOSIS — I5032 Chronic diastolic (congestive) heart failure: Secondary | ICD-10-CM | POA: Diagnosis not present

## 2021-01-02 DIAGNOSIS — J449 Chronic obstructive pulmonary disease, unspecified: Secondary | ICD-10-CM

## 2021-01-02 MED ORDER — PREDNISONE 10 MG PO TABS: ORAL_TABLET | ORAL | 0 refills | Status: AC

## 2021-01-02 NOTE — Progress Notes (Signed)
Provider: Austin Herd FNP-C  Lauree Chandler, NP  Patient Care Team: Lauree Chandler, NP as PCP - General (Geriatric Medicine) Zebedee Iba., MD as Referring Physician (Ophthalmology) Chesley Mires, MD as Consulting Physician (Pulmonary Disease) Lavonna Monarch, MD as Consulting Physician (Dermatology)  Extended Emergency Contact Information Primary Emergency Contact: Brand,Mike Mobile Phone: [phone] Relation: Son Secondary Emergency Contact: Chavous,Matt  [place] Mobile Phone: [phone] Relation: Son  Code Status:  DNR Goals of care: Advanced Directive information Advanced Directives 01/02/2021  Does Patient Have a Medical Advance Directive? Yes  Type of Paramedic of [place];Living will  Does patient want to make changes to medical advance directive? No - Patient declined  Copy of [place] in Chart? Yes - validated most recent copy scanned in chart (See row information)  Would patient like information on creating a medical advance directive? -  Pre-existing out of facility DNR order (yellow form or pink MOST form) -     Chief Complaint  Patient presents with   Acute Visit    Patient requesting more antibiotics.     HPI:  Pt is a 85 y.o. male seen today for an acute visit for evaluation of cough.He was here with similar symptoms cough on 12/22/2020 had left lung coarse breath sound which cleared with cough.Had no shortness of breath or wheezing.Had cough for several days and not resolving was treated clinically for possible Pneumonia.He had 10 days course of doxycycline.On 12/27/2020 he called office to report productive cough antibiotics not helping needed something different.Chest X-ray ordered showed hyperinflation of the lungs consistent with COPD or emphysema but no acute abnormalities. Has been using Albuterol,Symbicort and Duoneb  without any improvement. He denies any fever or chills.No recent  contact to sick person with COVID-19.  Oxygen saturation lower than previous level at 90% on RA.    Past Medical History:  Diagnosis Date   Allergy    Alzheimer's disease ([facility])    Anemia, unspecified    Anxiety    Chronic maxillary sinusitis    COPD (chronic obstructive pulmonary disease) (HCC)    Depression    Diabetes mellitus    Edema    Hypercalcemia    Hyperlipidemia    Hypertension    Hypertrophy of prostate without urinary obstruction and other lower urinary tract symptoms (LUTS)    Hypertrophy of prostate without urinary obstruction and other lower urinary tract symptoms (LUTS)    Kidney disease    mild   Unspecified disorder of kidney and ureter    Unspecified sleep apnea    Ventral hernia, unspecified, without mention of obstruction or gangrene    Past Surgical History:  Procedure Laterality Date   EXPLORATORY LAPAROTOMY  2002   HERNIA REPAIR      Allergies  Allergen Reactions   Ace Inhibitors Cough    Outpatient Encounter Medications as of 01/02/2021  Medication Sig   acetaminophen (TYLENOL) 500 MG tablet Take 1,000 mg by mouth every 8 (eight) hours as needed.   albuterol (VENTOLIN HFA) 108 (90 Base) MCG/ACT inhaler TAKE 2 PUFFS BY MOUTH EVERY 6 HOURS AS NEEDED FOR WHEEZE OR SHORTNESS OF BREATH   amLODipine (NORVASC) 10 MG tablet TAKE 1 TABLET BY MOUTH EVERY DAY   aspirin (ASPIRIN LOW DOSE) 81 MG EC tablet Take 1 tablet (81 mg total) by mouth daily. Swallow whole.   atorvastatin (LIPITOR) 80 MG tablet Take 1 tablet (80 mg total) by mouth daily.   BESIVANCE 0.6 %  SUSP INSTILL ONE DROP INTO RIGHT EYE 4 TIMES A DAY FOR 2 DAYS AFTER EACH MONTHLY EYE INJECTION   blood glucose meter kit and supplies KIT Inject 1 each into the skin 2 (two) times daily. Dispense based on patient and insurance preference. Use up to four times daily as directed. (FOR ICD-9 250.00, 250.01).   budesonide-formoterol (SYMBICORT) 160-4.5 MCG/ACT inhaler TAKE TWO PUFFS BY MOUTH TWICE DAILY    diclofenac sodium (VOLTAREN) 1 % GEL Apply 2 g topically 4 (four) times daily as needed (shoulder pain).   fluticasone (FLONASE) 50 MCG/ACT nasal spray SPRAY 2 SPRAYS INTO EACH NOSTRIL EVERY DAY   glucose blood (ONE TOUCH ULTRA TEST) test strip Use to test blood sugar twice daily.  DX: E11.9   hydrALAZINE (APRESOLINE) 25 MG tablet Take 1 tablet (25 mg total) by mouth 2 (two) times daily.   ipratropium-albuterol (DUONEB) 0.5-2.5 (3) MG/3ML SOLN Take 3 mLs by nebulization every 6 (six) hours as needed (wheezing).   JANUVIA 25 MG tablet TAKE 1 TABLET BY MOUTH EVERY DAY   Lancet Devices (ONE TOUCH DELICA LANCING DEV) MISC Use to test blood sugar once daily dx: E119   Lancets (ONETOUCH ULTRASOFT) lancets Use to test blood sugar up to three times daily. Dx E11.9   loratadine (CLARITIN) 10 MG tablet Take 1 tablet (10 mg total) by mouth daily.   metoprolol tartrate (LOPRESSOR) 50 MG tablet TAKE 1 TABLET BY MOUTH TWICE A DAY   montelukast (SINGULAIR) 10 MG tablet Take 1 tablet (10 mg total) by mouth at bedtime.   PARoxetine (PAXIL) 30 MG tablet TAKE 1 TABLET BY MOUTH EVERY DAY   sodium chloride (OCEAN) 0.65 % SOLN nasal spray Place 1 spray into both nostrils as needed for congestion.   traZODone (DESYREL) 50 MG tablet TAKE 1 TABLET BY MOUTH EVERYDAY AT BEDTIME   trimethoprim-polymyxin b (POLYTRIM) ophthalmic solution INSTILL ONE DROP INTO RIGHT EYE 4 TIMES A DAY FOR 2 DAYS AFTER EACH MONTHLY EYE INJECTION   Vitamin D, Ergocalciferol, (DRISDOL) 1.25 MG (50000 UNIT) CAPS capsule Take 1 capsule (50,000 Units total) by mouth once a week.   No facility-administered encounter medications on file as of 01/02/2021.    Review of Systems  Constitutional:  Negative for appetite change, chills, fatigue and fever.  HENT:  Positive for hearing loss. Negative for congestion, rhinorrhea, sinus pressure, sinus pain, sneezing and sore throat.   Respiratory:  Positive for cough. Negative for chest tightness, shortness of  breath and wheezing.   Cardiovascular:  Negative for chest pain, palpitations and leg swelling.  Gastrointestinal:  Negative for abdominal distention, abdominal pain, constipation, diarrhea, nausea and vomiting.  Skin:  Negative for color change, pallor and rash.  Neurological:  Negative for dizziness, speech difficulty, light-headedness and headaches.  Psychiatric/Behavioral:  Negative for agitation, behavioral problems, confusion and sleep disturbance. The patient is not nervous/anxious.    Immunization History  Administered Date(s) Administered   Fluad Quad(high Dose 65+) 05/27/2019, 03/30/2020   Influenza Split 04/08/2011   Influenza Whole 04/07/2012   Influenza, High Dose Seasonal PF 04/07/2017, 05/25/2018, 07/22/2019   Influenza,inj,Quad PF,6+ Mos 04/23/2013, 03/28/2014, 02/23/2016   Influenza-Unspecified 04/08/2015, 07/09/2015, 04/08/2017   Moderna Sars-Covid-2 Vaccination 10/07/2019, 11/07/2019, 05/10/2020   Pneumococcal Conjugate-13 06/06/2014   Pneumococcal-Unspecified 07/09/2007   Td 08/09/1997   Tdap 08/12/2011   Pertinent  Health Maintenance Due  Topic Date Due   FOOT EXAM  05/26/2020   OPHTHALMOLOGY EXAM  09/27/2020   INFLUENZA VACCINE  02/05/2021   HEMOGLOBIN A1C  04/11/2021   PNA vac Low Risk Adult  Completed   Fall Risk  01/02/2021 10/31/2020 10/09/2020 09/08/2020 05/25/2020  Falls in the past year? 0 0 0 0 0  Number falls in past yr: 0 0 0 0 0  Injury with Fall? 0 0 0 0 0  Risk for fall due to : No Fall Risks - - - -  Follow up Falls evaluation completed - - - -   Functional Status Survey:    Vitals:   01/02/21 1117  BP: 108/60  Pulse: (!) 59  Resp: 16  Temp: (!) 97.3 F (36.3 C)  SpO2: 90%  Weight: 173 lb 6.4 oz (78.7 kg)  Height: 5' 7"  (1.702 m)   Body mass index is 27.16 kg/m. Physical Exam Vitals reviewed.  Constitutional:      General: He is not in acute distress.    Appearance: He is overweight. He is not ill-appearing.  HENT:     Head:  Normocephalic.     Ears:     Comments: HOH left ear hearing aid     Mouth/Throat:     Mouth: Mucous membranes are moist.     Pharynx: No oropharyngeal exudate or posterior oropharyngeal erythema.  Eyes:     General: No scleral icterus.       Right eye: No discharge.        Left eye: No discharge.     Extraocular Movements: Extraocular movements intact.     Conjunctiva/sclera: Conjunctivae normal.     Pupils: Pupils are equal, round, and reactive to light.  Cardiovascular:     Rate and Rhythm: Normal rate and regular rhythm.     Pulses: Normal pulses.     Heart sounds: Normal heart sounds. No murmur heard.   No friction rub. No gallop.  Pulmonary:     Effort: Pulmonary effort is normal. No respiratory distress.     Breath sounds: Wheezing present. No rhonchi or rales.  Chest:     Chest wall: No tenderness.  Abdominal:     General: Bowel sounds are normal. There is no distension.     Palpations: Abdomen is soft. There is no mass.     Tenderness: There is no abdominal tenderness. There is no right CVA tenderness, left CVA tenderness, guarding or rebound.  Musculoskeletal:        General: No swelling or tenderness. Normal range of motion.     Cervical back: Normal range of motion. No rigidity or tenderness.     Right lower leg: No edema.     Left lower leg: No edema.  Lymphadenopathy:     Cervical: No cervical adenopathy.  Skin:    General: Skin is warm and dry.     Coloration: Skin is not pale.     Findings: No bruising or erythema.  Neurological:     Mental Status: He is alert and oriented to person, place, and time.     Cranial Nerves: No cranial nerve deficit.     Motor: No weakness.     Gait: Gait normal.  Psychiatric:        Mood and Affect: Mood normal.        Behavior: Behavior normal.        Thought Content: Thought content normal.        Judgment: Judgment normal.    Labs reviewed: Recent Labs    01/24/20 0909 03/10/20 0000 05/23/20 0821 10/10/20 0926   NA 140 145 140 143  K 4.5 5.1  4.5 4.4  CL 108 110* 106 111*  CO2 27 23* 25 23  GLUCOSE 98  --  120* 141*  BUN 54* 50* 53* 51*  CREATININE 2.64* 2.8* 2.90* 2.89*  CALCIUM 9.5 9.8 9.8 9.9   Recent Labs    03/10/20 0000 10/10/20 0926  AST  --  15  ALT  --  11  BILITOT  --  0.4  PROT  --  6.0*  ALBUMIN 3.6  --    Recent Labs    01/24/20 0909 10/10/20 0926  WBC 7.6 6.3  NEUTROABS 4,537 3,137  HGB 11.9* 12.2*  HCT 36.3* 37.1*  MCV 98.9 98.7  PLT 148 152   No results found for: TSH Lab Results  Component Value Date   HGBA1C 5.8 (H) 10/10/2020   Lab Results  Component Value Date   CHOL 164 10/10/2020   HDL 42 10/10/2020   LDLCALC 99 10/10/2020   TRIG 129 10/10/2020   CHOLHDL 3.9 10/10/2020    Significant Diagnostic Results in last 30 days:  DG Chest 2 View  Result Date: 12/28/2020 CLINICAL DATA:  Cough.  Congestion. EXAM: CHEST - 2 VIEW COMPARISON:  April 07, 2018 FINDINGS: No pneumothorax. Hyperinflation of the lungs is identified. Stable cardiomegaly. The hila and mediastinum are unchanged. No pulmonary nodules or masses. No focal infiltrates. No other acute abnormalities. IMPRESSION: Hyperinflation of the lungs consistent with COPD or emphysema. No other abnormalities. Electronically Signed   By: Dorise Bullion III M.D   On: 12/28/2020 16:42    Assessment/Plan  1.COPD with asthma ([place]) Afebrile.oxygen saturation lower than previous 90% on room air cough has improved but not resolved. Bilateral wheezing noted.will treat with prednisone taper as below.side effects discussed.do not  think he needs any additional antibiotics for now CXR negative.  - continue on Mucinex  - will refer to Pulmonology for PFT not seen for several years.Previous seen by Dr.Sood Vineet at [facility] and would like referral to Dr.Sood.  - Advised to Notify provider or go to [facility] if symptoms worsen  - predniSONE (DELTASONE) 10 MG tablet; Take 4 tablets (40 mg total) by mouth daily  with breakfast for 1 day, THEN 3 tablets (30 mg total) daily with breakfast for 1 day, THEN 2 tablets (20 mg total) daily with breakfast for 1 day, THEN 1 tablet (10 mg total) daily with breakfast for 1 day, THEN 0.5 tablets (5 mg total) daily with breakfast for 1 day.  Dispense: 10.5 tablet; Refill: 0 - Ambulatory referral to Pulmonology  2. Chronic diastolic CHF (congestive heart failure) (HCC) Weight stable.No shortness of breath or edema.    Family/ staff Communication: Reviewed plan of care with patient verbalized understanding.   Labs/tests ordered: None   Next Appointment: As needed if symptoms worsen or fail to improve    Sandrea Hughs, NP

## 2021-01-02 NOTE — Patient Instructions (Signed)
- continue on Mucinex  - Take Prednisone as directed  - Referral ordered for you to follow up with Pulmonologist for evaluation COPD - Notify provider or go to ED  if symptoms worsen   COPD and Physical Activity Chronic obstructive pulmonary disease (COPD) is a long-term, or chronic, condition that affects the lungs. COPD is a general term that can be used to describe many problems that cause inflammation of the lungs and limit airflow.These conditions include chronic bronchitis and emphysema. The main symptom of COPD is shortness of breath, which makes it harder to do even simple tasks. This can also make it harder to exercise and stay active. Talk with your health care provider about treatments to help you breathe betterand actions you can take to prevent breathing problems during physical activity. What are the benefits of exercising when you have COPD? Exercising regularly is an important part of a healthy lifestyle. You can still exercise and do physical activities even though you have COPD. Exercise and physical activity improve your shortness of breath by increasing blood flow (circulation). This causes your heart to pump more oxygen through your body. Moderate exercise can: Improve oxygen use. Increase your energy level. Help with shortness of breath. Strengthen your breathing muscles. Improve heart health. Help with sleep. Improve your self-esteem and feelings of self-worth. Lower depression, stress, and anxiety. Exercise can benefit everyone with COPD. The severity of your disease may affect how hard you can exercise, especially at first, but everyone can benefit. Talk with your health care provider about how much exercise is safefor you, and which activities and exercises are safe for you. What actions can I take to prevent breathing problems during physical activity? Sign up for a pulmonary rehabilitation program. This type of program may include: Education about lung  diseases. Exercise classes that teach you how to exercise and be more active while improving your breathing. This usually involves: Exercise using your lower extremities, such as a stationary bicycle. About 30 minutes of exercise, 2 to 5 times per week, for 6 to 12 weeks. Strength training, such as push-ups or leg lifts. Nutrition education. Group classes in which you can talk with others who also have COPD and learn ways to manage stress. If you use an oxygen tank, you should use it while you exercise. Work with your health care provider to adjust your oxygen for your physical activity. Your resting flow rate is different from your flow rate during physical activity. How to manage your breathing while exercising While you are exercising: Take slow breaths. Pace yourself, and do nottry to go too fast. Purse your lips while breathing out. Pursing your lips is similar to a kissing or whistling position. If doing exercise that uses a quick burst of effort, such as weight lifting: Breathe in before starting the exercise. Breathe out during the hardest part of the exercise, such as raising the weights. Where to find support You can find support for exercising with COPD from: Your health care provider. A pulmonary rehabilitation program. Your local health department or community health programs. Support groups, either online or in-person. Your health care provider may be able to recommend support groups. Where to find more information You can find more information about exercising with COPD from: American Lung Association: lung.org COPD Foundation: copdfoundation.org Contact a health care provider if: Your symptoms get worse. You have nausea. You have a fever. You want to start a new exercise program or a new activity. Get help right away if: You  have chest pain. You cannot breathe. These symptoms may represent a serious problem that is an emergency. Do not wait to see if the symptoms  will go away. Get medical help right away. Call your local emergency services (911 in the [place] Do not drive yourself to the hospital. Summary COPD is a general term that can be used to describe many different lung problems that cause lung inflammation and limit airflow. This includes chronic bronchitis and emphysema. Exercise and physical activity improve your shortness of breath by increasing blood flow (circulation). This causes your heart to provide more oxygen to your body. Contact your health care provider before starting any exercise program or new activity. Ask your health care provider what exercises and activities are safe for you. This information is not intended to replace advice given to you by your health care provider. Make sure you discuss any questions you have with your healthcare provider. Document Revised: 05/02/2020 Document Reviewed: 05/02/2020 Elsevier Patient Education  2022 [employer].

## 2021-01-10 ENCOUNTER — Other Ambulatory Visit: Payer: Self-pay

## 2021-01-10 ENCOUNTER — Encounter (INDEPENDENT_AMBULATORY_CARE_PROVIDER_SITE_OTHER): Payer: PPO | Admitting: Ophthalmology

## 2021-01-10 DIAGNOSIS — H43813 Vitreous degeneration, bilateral: Secondary | ICD-10-CM | POA: Diagnosis not present

## 2021-01-10 DIAGNOSIS — H35033 Hypertensive retinopathy, bilateral: Secondary | ICD-10-CM

## 2021-01-10 DIAGNOSIS — H353211 Exudative age-related macular degeneration, right eye, with active choroidal neovascularization: Secondary | ICD-10-CM

## 2021-01-10 DIAGNOSIS — I10 Essential (primary) hypertension: Secondary | ICD-10-CM

## 2021-01-10 DIAGNOSIS — H34832 Tributary (branch) retinal vein occlusion, left eye, with macular edema: Secondary | ICD-10-CM | POA: Diagnosis not present

## 2021-01-13 ENCOUNTER — Other Ambulatory Visit: Payer: Self-pay | Admitting: Nurse Practitioner

## 2021-01-13 DIAGNOSIS — E559 Vitamin D deficiency, unspecified: Secondary | ICD-10-CM

## 2021-01-15 ENCOUNTER — Other Ambulatory Visit: Payer: Self-pay | Admitting: *Deleted

## 2021-01-15 MED ORDER — TRAZODONE HCL 50 MG PO TABS: ORAL_TABLET | ORAL | 1 refills | Status: DC

## 2021-01-15 NOTE — Telephone Encounter (Signed)
Received request from pharmacy Pended Rx and sent to [facility] for approval due to [place].

## 2021-01-16 ENCOUNTER — Other Ambulatory Visit: Payer: Self-pay | Admitting: *Deleted

## 2021-01-16 MED ORDER — AMLODIPINE BESYLATE 10 MG PO TABS: 10.0000 mg | ORAL_TABLET | Freq: Every day | ORAL | 5 refills | Status: DC

## 2021-01-16 MED ORDER — SITAGLIPTIN PHOSPHATE 25 MG PO TABS: 25.0000 mg | ORAL_TABLET | Freq: Every day | ORAL | 5 refills | Status: DC

## 2021-01-16 MED ORDER — ATORVASTATIN CALCIUM 80 MG PO TABS: 80.0000 mg | ORAL_TABLET | Freq: Every day | ORAL | 5 refills | Status: DC

## 2021-01-16 NOTE — Addendum Note (Signed)
Addended by: Rafael Bihari A on: 01/16/2021 10:20 AM   Modules accepted: Orders

## 2021-01-16 NOTE — Telephone Encounter (Signed)
[facility]

## 2021-01-23 ENCOUNTER — Ambulatory Visit: Payer: PPO | Admitting: Pulmonary Disease

## 2021-01-23 ENCOUNTER — Other Ambulatory Visit: Payer: Self-pay

## 2021-01-23 ENCOUNTER — Encounter: Payer: Self-pay | Admitting: Pulmonary Disease

## 2021-01-23 DIAGNOSIS — J449 Chronic obstructive pulmonary disease, unspecified: Secondary | ICD-10-CM | POA: Diagnosis not present

## 2021-01-23 DIAGNOSIS — J0110 Acute frontal sinusitis, unspecified: Secondary | ICD-10-CM

## 2021-01-23 DIAGNOSIS — R0989 Other specified symptoms and signs involving the circulatory and respiratory systems: Secondary | ICD-10-CM

## 2021-01-23 DIAGNOSIS — J069 Acute upper respiratory infection, unspecified: Secondary | ICD-10-CM | POA: Diagnosis not present

## 2021-01-23 MED ORDER — LORATADINE 10 MG PO TABS: 10.0000 mg | ORAL_TABLET | Freq: Every day | ORAL | 5 refills | Status: DC

## 2021-01-23 MED ORDER — GUAIFENESIN ER 600 MG PO TB12: 1200.0000 mg | ORAL_TABLET | Freq: Two times a day (BID) | ORAL | Status: DC | PRN

## 2021-01-23 MED ORDER — ALBUTEROL SULFATE HFA 108 (90 BASE) MCG/ACT IN AERS: 2.0000 | INHALATION_SPRAY | Freq: Four times a day (QID) | RESPIRATORY_TRACT | 3 refills | Status: DC | PRN

## 2021-01-23 MED ORDER — FLUTICASONE PROPIONATE 50 MCG/ACT NA SUSP: 1.0000 | Freq: Every day | NASAL | 3 refills | Status: DC

## 2021-01-23 MED ORDER — MONTELUKAST SODIUM 10 MG PO TABS: 10.0000 mg | ORAL_TABLET | Freq: Every day | ORAL | 6 refills | Status: DC

## 2021-01-23 NOTE — Progress Notes (Signed)
 [facility]  Chief Complaint  Patient presents with   Follow-up    Patient does not have any concerns today.     Constitutional:  BP 120/60 (BP Location: Right Arm, Patient Position: Sitting, Cuff Size: Normal)   Pulse (!) 50   Ht 5' 7" (1.702 m)   Wt 176 lb 12.8 oz (80.2 kg)   SpO2 94%   BMI 27.69 kg/m   Past Medical History:  Alzheimer's disease, Anemia, Anxiety, Depression, DM, HLD, HTN, BPH  Past Surgical History:  He  has a past surgical history that includes Hernia repair and Exploratory laparotomy (2002).  Brief Summary:  Ian [name] is a 85 y.o. male former smoker with chronic cough from COPD/asthma and rhinitis.      Subjective:   He has difficulty hearing.  Used Microsoft Word and typed in my dialogue which he read and then responded verbally.  He has sinus and chest congestion.  Gets cough with clear sputum.  Not having wheeze, chest pain, fever, or hemoptysis.  It isn't clear which inhalers he has been using.  It seems he is using symbicort and is using mucinex.  He says he doesn't have albuterol, flonase, singulair, or claritin.  Physical Exam:   Appearance - well kempt   ENMT - no sinus tenderness, no oral exudate, no LAN, Mallampati 4 airway, no stridor, narrow nasal angles  Respiratory - equal breath sounds bilaterally, no wheezing or rales  CV - s1s2 regular rate and rhythm, no murmurs  Ext - no clubbing, no edema  Skin - no rashes  Psych - normal mood and affect   Pulmonary testing:  PFT 11/29/11 >> FEV1 1.71 (67%), FEV1% 64, TLC 5.29 (92%), DLCO 84%, +BD PFT 01/01/18 >> FEV1 1.34 (54%), FEV1% 63, DLCO 60%  Chest Imaging:  CT chest 12/29/12 >> lingular PNA CT chest 12/06/15 >> mild centrilobular emphysema, nodularity in Lt upper and lower lobes CT chest 02/05/16 >> mild paraseptal emphysema, scar in lingula, much improved nodules in LUL  Cardiac Tests:  Echo 01/03/13 >> EF 60 to 65%, grade 1  DD  Social History:  He  reports that he quit smoking about 32 years ago. His smoking use included cigarettes. He has a 45.00 pack-year smoking history. He has never used smokeless tobacco. He reports that he does not drink alcohol and does not use drugs.  Family History:  His family history includes Heart disease in his father; Hypertension in his brother and sister; Pancreatic cancer in his sister.     Assessment/Plan:   COPD with asthma and emphysema. - continue symbicort - renew singulair and albuterol - prn mucinex for chest congestion and cough - will arrange for flutter valve  Allergic rhinitis. - refill claritin, flonase, singulair  Time Spent Involved in Patient Care on Day of Examination:  32 minutes  Follow up:   Patient Instructions  Symbicort two puffs in the morning and two puffs in the evening Singulair 10 mg pill nightly Flonase one spray in each nostril daily Claritin 10 mg pill nightly as needed for allergies Albuterol two puffs every 6 hours as needed for cough, wheeze, or chest congestion Mucinex 1200 mg twice per day as needed to help with cough and chest congestion Will arrange for flutter valve  Follow up in 6 months  Medication List:   Allergies as of 01/23/2021       Reactions   Ace Inhibitors Cough          Medication List        Accurate as of January 23, 2021  2:56 PM. If you have any questions, ask your nurse or doctor.          acetaminophen 500 MG tablet Commonly known as: TYLENOL Take 1,000 mg by mouth every 8 (eight) hours as needed.   albuterol 108 (90 Base) MCG/ACT inhaler Commonly known as: VENTOLIN HFA Inhale 2 puffs into the lungs every 6 (six) hours as needed for wheezing or shortness of breath. What changed: See the new instructions. Changed by:  , MD   amLODipine 10 MG tablet Commonly known as: NORVASC Take 1 tablet (10 mg total) by mouth daily.   aspirin 81 MG EC tablet Commonly known as: Aspirin Low  Dose Take 1 tablet (81 mg total) by mouth daily. Swallow whole.   atorvastatin 80 MG tablet Commonly known as: LIPITOR Take 1 tablet (80 mg total) by mouth daily.   Besivance 0.6 % Susp Generic drug: Besifloxacin HCl INSTILL ONE DROP INTO RIGHT EYE 4 TIMES A DAY FOR 2 DAYS AFTER EACH MONTHLY EYE INJECTION   blood glucose meter kit and supplies Kit Inject 1 each into the skin 2 (two) times daily. Dispense based on patient and insurance preference. Use up to four times daily as directed. (FOR ICD-9 250.00, 250.01).   budesonide-formoterol 160-4.5 MCG/ACT inhaler Commonly known as: SYMBICORT TAKE TWO PUFFS BY MOUTH TWICE DAILY   diclofenac sodium 1 % Gel Commonly known as: VOLTAREN Apply 2 g topically 4 (four) times daily as needed (shoulder pain).   fluticasone 50 MCG/ACT nasal spray Commonly known as: FLONASE Place 1 spray into both nostrils daily. What changed: See the new instructions. Changed by:  , MD   glucose blood test strip Commonly known as: ONE TOUCH ULTRA TEST Use to test blood sugar twice daily.  DX: E11.9   guaiFENesin 600 MG 12 hr tablet Commonly known as: Mucinex Take 2 tablets (1,200 mg total) by mouth 2 (two) times daily as needed for cough or to loosen phlegm. Started by:  , MD   hydrALAZINE 25 MG tablet Commonly known as: APRESOLINE Take 1 tablet (25 mg total) by mouth 2 (two) times daily.   ipratropium-albuterol 0.5-2.5 (3) MG/3ML Soln Commonly known as: DUONEB Take 3 mLs by nebulization every 6 (six) hours as needed (wheezing).   loratadine 10 MG tablet Commonly known as: CLARITIN Take 1 tablet (10 mg total) by mouth daily.   metoprolol tartrate 50 MG tablet Commonly known as: LOPRESSOR TAKE 1 TABLET BY MOUTH TWICE A DAY   montelukast 10 MG tablet Commonly known as: SINGULAIR Take 1 tablet (10 mg total) by mouth at bedtime.   ONE TOUCH DELICA LANCING DEV Misc Use to test blood sugar once daily dx: E119   onetouch  ultrasoft lancets Use to test blood sugar up to three times daily. Dx E11.9   PARoxetine 30 MG tablet Commonly known as: PAXIL TAKE 1 TABLET BY MOUTH EVERY DAY   sitaGLIPtin 25 MG tablet Commonly known as: Januvia Take 1 tablet (25 mg total) by mouth daily.   sodium chloride 0.65 % Soln nasal spray Commonly known as: OCEAN Place 1 spray into both nostrils as needed for congestion.   traZODone 50 MG tablet Commonly known as: DESYREL TAKE 1 TABLET BY MOUTH EVERYDAY AT BEDTIME   trimethoprim-polymyxin b ophthalmic solution Commonly known as: POLYTRIM INSTILL ONE DROP INTO RIGHT EYE 4 TIMES A DAY FOR 2 DAYS AFTER EACH MONTHLY EYE INJECTION     Vitamin D (Ergocalciferol) 1.25 MG (50000 UNIT) Caps capsule Commonly known as: DRISDOL TAKE 1 CAPSULE BY MOUTH ONE TIME PER WEEK        Signature:   , MD [facility] Pager - [phone] 01/23/2021, 2:56 PM           

## 2021-01-23 NOTE — Patient Instructions (Signed)
Symbicort two puffs in the morning and two puffs in the evening Singulair 10 mg pill nightly Flonase one spray in each nostril daily Claritin 10 mg pill nightly as needed for allergies Albuterol two puffs every 6 hours as needed for cough, wheeze, or chest congestion Mucinex 1200 mg twice per day as needed to help with cough and chest congestion Will arrange for flutter valve  Follow up in 6 months

## 2021-01-24 NOTE — Addendum Note (Signed)
Addended by: Dessie Coma on: 01/24/2021 10:48 AM   Modules accepted: Orders

## 2021-01-26 ENCOUNTER — Other Ambulatory Visit: Payer: Self-pay

## 2021-01-26 ENCOUNTER — Encounter: Payer: Self-pay | Admitting: Family

## 2021-01-26 ENCOUNTER — Ambulatory Visit (INDEPENDENT_AMBULATORY_CARE_PROVIDER_SITE_OTHER): Payer: PPO | Admitting: Family

## 2021-01-26 VITALS — BP 120/70 | HR 80 | Temp 97.8°F | Resp 16 | Ht 67.0 in | Wt 172.6 lb

## 2021-01-26 DIAGNOSIS — J449 Chronic obstructive pulmonary disease, unspecified: Secondary | ICD-10-CM

## 2021-01-26 DIAGNOSIS — K5904 Chronic idiopathic constipation: Secondary | ICD-10-CM

## 2021-01-26 MED ORDER — BUDESONIDE-FORMOTEROL FUMARATE 160-4.5 MCG/ACT IN AERO: INHALATION_SPRAY | RESPIRATORY_TRACT | 5 refills | Status: DC

## 2021-01-26 MED ORDER — BISACODYL EC 5 MG PO TBEC: 5.0000 mg | DELAYED_RELEASE_TABLET | Freq: Every day | ORAL | 0 refills | Status: DC | PRN

## 2021-01-26 NOTE — Progress Notes (Signed)
Provider: Pearlean Sabina FNP-C  Lauree Chandler, NP  Patient Care Team: Lauree Chandler, NP as PCP - General (Geriatric Medicine) Zebedee Iba., MD as Referring Physician (Ophthalmology) Chesley Mires, MD as Consulting Physician (Pulmonary Disease) Lavonna Monarch, MD as Consulting Physician (Dermatology)  Extended Emergency Contact Information Primary Emergency Contact: Fritzler,Mike Mobile Phone: [phone] Relation: Son Secondary Emergency Contact: Topping,Matt  [place] Mobile Phone: [phone] Relation: Son  Code Status:  DNR Goals of care: Advanced Directive information Advanced Directives 01/26/2021  Does Patient Have a Medical Advance Directive? Yes  Type of Paramedic of [place];Living will  Does patient want to make changes to medical advance directive? No - Patient declined  Copy of [place] in Chart? Yes - validated most recent copy scanned in chart (See row information)  Would patient like information on creating a medical advance directive? -  Pre-existing out of facility DNR order (yellow form or pink MOST form) -     Chief Complaint  Patient presents with   Acute Visit    Patient complains that stomach is hurting and not sure if he's constipated.     HPI:  Pt is a 85 y.o. male seen today for an acute visit for evaluation of constipation.unclear how many days he has not had a bowel movement. Has stopped taking Miralax wanted to see whether he can do without it.Had abdominal pain few days ago but has resolved.Denies any abdominal distention,cramping,nausea or vomiting.  Also denies any fever or chills. He states previous cough has improved but not resolved.request another cough medication other than Mucinex states cough waking him awake at night.CXR done 12/27/2020 consistent with COPD/emphysema.Has been using his Albuterol and Symbicort.Request refills for Symbicort. No contact with sick person  with COVID-19   Past Medical History:  Diagnosis Date   Allergy    Alzheimer's disease ([place])    Anemia, unspecified    Anxiety    Chronic maxillary sinusitis    COPD (chronic obstructive pulmonary disease) (HCC)    Depression    Diabetes mellitus    Edema    Hypercalcemia    Hyperlipidemia    Hypertension    Hypertrophy of prostate without urinary obstruction and other lower urinary tract symptoms (LUTS)    Hypertrophy of prostate without urinary obstruction and other lower urinary tract symptoms (LUTS)    Kidney disease    mild   Unspecified disorder of kidney and ureter    Unspecified sleep apnea    Ventral hernia, unspecified, without mention of obstruction or gangrene    Past Surgical History:  Procedure Laterality Date   EXPLORATORY LAPAROTOMY  2002   HERNIA REPAIR      Allergies  Allergen Reactions   Ace Inhibitors Cough    Outpatient Encounter Medications as of 01/26/2021  Medication Sig   acetaminophen (TYLENOL) 500 MG tablet Take 1,000 mg by mouth every 8 (eight) hours as needed.   albuterol (VENTOLIN HFA) 108 (90 Base) MCG/ACT inhaler Inhale 2 puffs into the lungs every 6 (six) hours as needed for wheezing or shortness of breath.   amLODipine (NORVASC) 10 MG tablet Take 1 tablet (10 mg total) by mouth daily.   aspirin (ASPIRIN LOW DOSE) 81 MG EC tablet Take 1 tablet (81 mg total) by mouth daily. Swallow whole.   atorvastatin (LIPITOR) 80 MG tablet Take 1 tablet (80 mg total) by mouth daily.   BESIVANCE 0.6 % SUSP INSTILL ONE DROP INTO RIGHT EYE 4 TIMES  A DAY FOR 2 DAYS AFTER EACH MONTHLY EYE INJECTION   blood glucose meter kit and supplies KIT Inject 1 each into the skin 2 (two) times daily. Dispense based on patient and insurance preference. Use up to four times daily as directed. (FOR ICD-9 250.00, 250.01).   budesonide-formoterol (SYMBICORT) 160-4.5 MCG/ACT inhaler TAKE TWO PUFFS BY MOUTH TWICE DAILY   diclofenac sodium (VOLTAREN) 1 % GEL Apply 2 g topically 4  (four) times daily as needed (shoulder pain).   fluticasone (FLONASE) 50 MCG/ACT nasal spray Place 1 spray into both nostrils daily.   glucose blood (ONE TOUCH ULTRA TEST) test strip Use to test blood sugar twice daily.  DX: E11.9   guaiFENesin (MUCINEX) 600 MG 12 hr tablet Take 2 tablets (1,200 mg total) by mouth 2 (two) times daily as needed for cough or to loosen phlegm.   hydrALAZINE (APRESOLINE) 25 MG tablet Take 1 tablet (25 mg total) by mouth 2 (two) times daily.   ipratropium-albuterol (DUONEB) 0.5-2.5 (3) MG/3ML SOLN Take 3 mLs by nebulization every 6 (six) hours as needed (wheezing).   Lancet Devices (ONE TOUCH DELICA LANCING DEV) MISC Use to test blood sugar once daily dx: E119   Lancets (ONETOUCH ULTRASOFT) lancets Use to test blood sugar up to three times daily. Dx E11.9   loratadine (CLARITIN) 10 MG tablet Take 1 tablet (10 mg total) by mouth daily.   metoprolol tartrate (LOPRESSOR) 50 MG tablet TAKE 1 TABLET BY MOUTH TWICE A DAY   montelukast (SINGULAIR) 10 MG tablet Take 1 tablet (10 mg total) by mouth at bedtime.   PARoxetine (PAXIL) 30 MG tablet TAKE 1 TABLET BY MOUTH EVERY DAY   sitaGLIPtin (JANUVIA) 25 MG tablet Take 1 tablet (25 mg total) by mouth daily.   sodium chloride (OCEAN) 0.65 % SOLN nasal spray Place 1 spray into both nostrils as needed for congestion.   traZODone (DESYREL) 50 MG tablet TAKE 1 TABLET BY MOUTH EVERYDAY AT BEDTIME   trimethoprim-polymyxin b (POLYTRIM) ophthalmic solution INSTILL ONE DROP INTO RIGHT EYE 4 TIMES A DAY FOR 2 DAYS AFTER EACH MONTHLY EYE INJECTION   Vitamin D, Ergocalciferol, (DRISDOL) 1.25 MG (50000 UNIT) CAPS capsule TAKE 1 CAPSULE BY MOUTH ONE TIME PER WEEK   No facility-administered encounter medications on file as of 01/26/2021.    Review of Systems  Constitutional:  Negative for appetite change, chills, fatigue, fever and unexpected weight change.  HENT:  Negative for congestion, dental problem, ear discharge, ear pain, facial  swelling, hearing loss, nosebleeds, postnasal drip, rhinorrhea, sinus pressure, sinus pain, sneezing, sore throat, tinnitus and trouble swallowing.   Eyes:  Negative for pain, discharge, redness, itching and visual disturbance.  Respiratory:  Positive for cough. Negative for chest tightness, shortness of breath and wheezing.        Cough has improved but not resolved.request something else other than mucinex   Cardiovascular:  Negative for chest pain, palpitations and leg swelling.  Gastrointestinal:  Positive for constipation. Negative for abdominal distention, blood in stool, diarrhea, nausea and vomiting.       Had abdominal pain few days ago but resolved   Musculoskeletal:  Positive for gait problem. Negative for arthralgias, back pain, joint swelling, myalgias, neck pain and neck stiffness.  Skin:  Negative for color change, pallor, rash and wound.  Neurological:  Negative for dizziness, speech difficulty, weakness, light-headedness and headaches.  Psychiatric/Behavioral:  Negative for agitation, behavioral problems, confusion, hallucinations and sleep disturbance. The patient is not nervous/anxious.  Immunization History  Administered Date(s) Administered   Fluad Quad(high Dose 65+) 05/27/2019, 03/30/2020   Influenza Split 04/08/2011   Influenza Whole 04/07/2012   Influenza, High Dose Seasonal PF 04/07/2017, 05/25/2018, 07/22/2019   Influenza,inj,Quad PF,6+ Mos 04/23/2013, 03/28/2014, 02/23/2016   Influenza-Unspecified 04/08/2015, 07/09/2015, 04/08/2017   Moderna Sars-Covid-2 Vaccination 10/07/2019, 11/07/2019, 05/10/2020   Pneumococcal Conjugate-13 06/06/2014   Pneumococcal-Unspecified 07/09/2007   Td 08/09/1997   Tdap 08/12/2011   Pertinent  Health Maintenance Due  Topic Date Due   FOOT EXAM  05/26/2020   OPHTHALMOLOGY EXAM  09/27/2020   INFLUENZA VACCINE  02/05/2021   HEMOGLOBIN A1C  04/11/2021   PNA vac Low Risk Adult  Completed   Fall Risk  01/26/2021 01/02/2021 10/31/2020  10/09/2020 09/08/2020  Falls in the past year? 0 0 0 0 0  Number falls in past yr: 0 0 0 0 0  Injury with Fall? 0 0 0 0 0  Risk for fall due to : No Fall Risks No Fall Risks - - -  Follow up Falls evaluation completed Falls evaluation completed - - -   Functional Status Survey:    Vitals:   01/26/21 1519  BP: 120/70  Pulse: 80  Resp: 16  Temp: 97.8 F (36.6 C)  SpO2: 92%  Weight: 172 lb 9.6 oz (78.3 kg)  Height: _0  (1.702 m)   Body mass index is 27.03 kg/m. Physical Exam Vitals reviewed.  Constitutional:      General: He is not in acute distress.    Appearance: Normal appearance. He is normal weight. He is not ill-appearing or diaphoretic.  HENT:     Head: Normocephalic.     Comments: HOH     Mouth/Throat:     Mouth: Mucous membranes are moist.     Pharynx: Oropharynx is clear. No oropharyngeal exudate or posterior oropharyngeal erythema.  Eyes:     General: No scleral icterus.       Right eye: No discharge.        Left eye: No discharge.     Extraocular Movements: Extraocular movements intact.     Conjunctiva/sclera: Conjunctivae normal.     Pupils: Pupils are equal, round, and reactive to light.  Neck:     Vascular: No carotid bruit.  Cardiovascular:     Rate and Rhythm: Normal rate and regular rhythm.     Pulses: Normal pulses.     Heart sounds: Normal heart sounds. No murmur heard.   No friction rub. No gallop.  Pulmonary:     Effort: Pulmonary effort is normal. No respiratory distress.     Breath sounds: No wheezing, rhonchi or rales.     Comments: Diminished clear bases  Chest:     Chest wall: No tenderness.  Abdominal:     General: Bowel sounds are normal. There is no distension.     Palpations: Abdomen is soft. There is no mass.     Tenderness: There is no abdominal tenderness. There is no right CVA tenderness, left CVA tenderness, guarding or rebound.  Musculoskeletal:        General: No swelling or tenderness.     Cervical back: Normal range of  motion. No rigidity or tenderness.     Right lower leg: No edema.     Left lower leg: No edema.     Comments: Unsteady gait   Lymphadenopathy:     Cervical: No cervical adenopathy.  Skin:    General: Skin is warm and dry.     Coloration:  Skin is not pale.     Findings: No bruising, erythema or rash.  Neurological:     Mental Status: He is alert and oriented to person, place, and time.     Motor: No weakness.     Gait: Gait abnormal.  Psychiatric:        Mood and Affect: Mood normal.        Speech: Speech normal.        Behavior: Behavior normal.        Thought Content: Thought content normal.        Judgment: Judgment normal.    Labs reviewed: Recent Labs    03/10/20 0000 05/23/20 0821 10/10/20 0926  NA 145 140 143  K 5.1 4.5 4.4  CL 110* 106 111*  CO2 23* 25 23  GLUCOSE  --  120* 141*  BUN 50* 53* 51*  CREATININE 2.8* 2.90* 2.89*  CALCIUM 9.8 9.8 9.9   Recent Labs    03/10/20 0000 10/10/20 0926  AST  --  15  ALT  --  11  BILITOT  --  0.4  PROT  --  6.0*  ALBUMIN 3.6  --    Recent Labs    10/10/20 0926  WBC 6.3  NEUTROABS 3,137  HGB 12.2*  HCT 37.1*  MCV 98.7  PLT 152   No results found for: TSH Lab Results  Component Value Date   HGBA1C 5.8 (H) 10/10/2020   Lab Results  Component Value Date   CHOL 164 10/10/2020   HDL 42 10/10/2020   LDLCALC 99 10/10/2020   TRIG 129 10/10/2020   CHOLHDL 3.9 10/10/2020    Significant Diagnostic Results in last 30 days:  No results found.  Assessment/Plan 1. Chronic idiopathic constipation Unclear how many days without BM. - encouraged to increase fiber in diet  - increase water intake to 6-8 glasses of water daily and exercise as tolerated  - continue on miralax  - bisacodyl 5 MG EC tablet; Take 1 tablet (5 mg total) by mouth daily as needed for moderate constipation.  Dispense: 30 tablet; Refill: 0 - advised to notify provider if symptoms worsen or no bowel movement.   2. COPD with asthma ([place]) No  shortness of breath noted.bilateral bases diminished clear breath sounds. - continue on Albuterol and Symbicort  Dylsym cough syrup 15 ml samples x 2 given advised to take at bedtime.  - budesonide-formoterol (SYMBICORT) 160-4.5 MCG/ACT inhaler; TAKE TWO PUFFS BY MOUTH TWICE DAILY  Dispense: 10.2 g; Refill: 5 - notify provider if symptoms worsen or not resolved.  Family/ staff Communication: Reviewed plan of care with patient verbalized understanding   Labs/tests ordered: None   Next Appointment:As needed if symptoms worsen or fail to improve    Sandrea Hughs, NP

## 2021-01-26 NOTE — Patient Instructions (Signed)
-   Take Dulcolax 5 mg tablet today may repeat x 1 dose if still no bowel movement   - Restart Miralax podwer for constipation   - Notify provider's office if still no bowel movement after taking Dulcolax and miralax

## 2021-01-29 DIAGNOSIS — N184 Chronic kidney disease, stage 4 (severe): Secondary | ICD-10-CM | POA: Diagnosis not present

## 2021-02-06 DIAGNOSIS — N184 Chronic kidney disease, stage 4 (severe): Secondary | ICD-10-CM | POA: Diagnosis not present

## 2021-02-06 DIAGNOSIS — N2581 Secondary hyperparathyroidism of renal origin: Secondary | ICD-10-CM | POA: Diagnosis not present

## 2021-02-06 DIAGNOSIS — I129 Hypertensive chronic kidney disease with stage 1 through stage 4 chronic kidney disease, or unspecified chronic kidney disease: Secondary | ICD-10-CM | POA: Diagnosis not present

## 2021-02-08 ENCOUNTER — Ambulatory Visit: Payer: PPO | Admitting: Nurse Practitioner

## 2021-02-12 ENCOUNTER — Encounter: Payer: Self-pay | Admitting: Family

## 2021-02-12 ENCOUNTER — Ambulatory Visit: Payer: PPO | Admitting: Nurse Practitioner

## 2021-02-12 ENCOUNTER — Other Ambulatory Visit: Payer: Self-pay

## 2021-02-12 ENCOUNTER — Ambulatory Visit (INDEPENDENT_AMBULATORY_CARE_PROVIDER_SITE_OTHER): Payer: PPO | Admitting: Family

## 2021-02-12 VITALS — BP 130/60 | HR 60 | Temp 97.7°F | Resp 16 | Ht 67.0 in | Wt 172.4 lb

## 2021-02-12 DIAGNOSIS — I10 Essential (primary) hypertension: Secondary | ICD-10-CM

## 2021-02-12 DIAGNOSIS — E785 Hyperlipidemia, unspecified: Secondary | ICD-10-CM

## 2021-02-12 DIAGNOSIS — Z23 Encounter for immunization: Secondary | ICD-10-CM

## 2021-02-12 DIAGNOSIS — E1122 Type 2 diabetes mellitus with diabetic chronic kidney disease: Secondary | ICD-10-CM

## 2021-02-12 DIAGNOSIS — N184 Chronic kidney disease, stage 4 (severe): Secondary | ICD-10-CM

## 2021-02-12 DIAGNOSIS — E1169 Type 2 diabetes mellitus with other specified complication: Secondary | ICD-10-CM | POA: Diagnosis not present

## 2021-02-12 DIAGNOSIS — I5032 Chronic diastolic (congestive) heart failure: Secondary | ICD-10-CM

## 2021-02-12 DIAGNOSIS — J449 Chronic obstructive pulmonary disease, unspecified: Secondary | ICD-10-CM | POA: Diagnosis not present

## 2021-02-12 MED ORDER — ZOSTER VAC RECOMB ADJUVANTED 50 MCG/0.5ML IM SUSR: 0.5000 mL | Freq: Once | INTRAMUSCULAR | 0 refills | Status: AC

## 2021-02-12 NOTE — Patient Instructions (Signed)
Please get your shingrix vaccine and 2nd COVID-19 booster vaccine at your pharmacy

## 2021-02-12 NOTE — Progress Notes (Signed)
Provider: Sandhya Denherder FNP-C   Lauree Chandler, NP  Patient Care Team: Lauree Chandler, NP as PCP - General (Geriatric Medicine) Zebedee Iba., MD as Referring Physician (Ophthalmology) Chesley Mires, MD as Consulting Physician (Pulmonary Disease) Lavonna Monarch, MD as Consulting Physician (Dermatology)  Extended Emergency Contact Information Primary Emergency Contact: Harned,Mike Mobile Phone: [phone] Relation: Son Secondary Emergency Contact: Lecomte,Matt  [place] Mobile Phone: [phone] Relation: Son  Code Status:  DNR Goals of care: Advanced Directive information Advanced Directives 02/12/2021  Does Patient Have a Medical Advance Directive? Yes  Type of Paramedic of [place];Living will;Out of facility DNR (pink MOST or yellow form)  Does patient want to make changes to medical advance directive? No - Patient declined  Copy of [place] in Chart? Yes - validated most recent copy scanned in chart (See row information)  Would patient like information on creating a medical advance directive? -  Pre-existing out of facility DNR order (yellow form or pink MOST form) -     Chief Complaint  Patient presents with   Medical Management of Chronic Issues    4 month follow up.    Health Maintenance    Discuss the need for foot exam, and eye exam.    Immunizations    Discuss the need for shingrix vaccine, 2nd covid booster, and influenza vaccine.     HPI:  Pt is a 85 y.o. male seen today for 4 month follow up for medical management of chronic diseases. Has a medical history of Hypertension,Hyperlipidemia,type 2 DM with CKD,Chronic congestive Heart Failure,COPD with Asthma,Depression,Anxiety ,Alzheimer's disease among others.He denies any acute issues   Weight stable no abrupt weight gain.Denies chest pain, shortness of breath, lower extremity edema, fatigue, palpitations,weakness, presyncope, syncope,  orthopnea, and PND. His previous lab work reviewed Hgb A1C 5.6 previous 6.0  Previous Anemia resolved latest hgb 12.2 previous was 11.9  States following up with Ophthalmologist Dr.Mathews  His HR on arrival was low 50 but rechecked apical 60 b/min denies any weakness,dizziness or faintness.    Past Medical History:  Diagnosis Date   Allergy    Alzheimer's disease ([place])    Anemia, unspecified    Anxiety    Chronic maxillary sinusitis    COPD (chronic obstructive pulmonary disease) ([place])    Depression    Diabetes mellitus    Edema    Hypercalcemia    Hyperlipidemia    Hypertension    Hypertrophy of prostate without urinary obstruction and other lower urinary tract symptoms (LUTS)    Hypertrophy of prostate without urinary obstruction and other lower urinary tract symptoms (LUTS)    Kidney disease    mild   Unspecified disorder of kidney and ureter    Unspecified sleep apnea    Ventral hernia, unspecified, without mention of obstruction or gangrene    Past Surgical History:  Procedure Laterality Date   EXPLORATORY LAPAROTOMY  2002   HERNIA REPAIR      Allergies  Allergen Reactions   Ace Inhibitors Cough    Allergies as of 02/12/2021       Reactions   Ace Inhibitors Cough        Medication List        Accurate as of February 12, 2021 11:55 AM. If you have any questions, ask your nurse or doctor.          acetaminophen 500 MG tablet Commonly known as: TYLENOL Take 1,000 mg by mouth every  8 (eight) hours as needed.   albuterol 108 (90 Base) MCG/ACT inhaler Commonly known as: VENTOLIN HFA Inhale 2 puffs into the lungs every 6 (six) hours as needed for wheezing or shortness of breath.   amLODipine 10 MG tablet Commonly known as: NORVASC Take 1 tablet (10 mg total) by mouth daily.   aspirin 81 MG EC tablet Commonly known as: Aspirin Low Dose Take 1 tablet (81 mg total) by mouth daily. Swallow whole.   atorvastatin 80 MG tablet Commonly known as:  LIPITOR Take 1 tablet (80 mg total) by mouth daily.   Besivance 0.6 % Susp Generic drug: Besifloxacin HCl INSTILL ONE DROP INTO RIGHT EYE 4 TIMES A DAY FOR 2 DAYS AFTER EACH MONTHLY EYE INJECTION   bisacodyl 5 MG EC tablet Generic drug: bisacodyl Take 1 tablet (5 mg total) by mouth daily as needed for moderate constipation.   blood glucose meter kit and supplies Kit Inject 1 each into the skin 2 (two) times daily. Dispense based on patient and insurance preference. Use up to four times daily as directed. (FOR ICD-9 250.00, 250.01).   budesonide-formoterol 160-4.5 MCG/ACT inhaler Commonly known as: SYMBICORT TAKE TWO PUFFS BY MOUTH TWICE DAILY   diclofenac sodium 1 % Gel Commonly known as: VOLTAREN Apply 2 g topically 4 (four) times daily as needed (shoulder pain).   fluticasone 50 MCG/ACT nasal spray Commonly known as: FLONASE Place 1 spray into both nostrils daily.   glucose blood test strip Commonly known as: ONE TOUCH ULTRA TEST Use to test blood sugar twice daily.  DX: E11.9   guaiFENesin 600 MG 12 hr tablet Commonly known as: Mucinex Take 2 tablets (1,200 mg total) by mouth 2 (two) times daily as needed for cough or to loosen phlegm.   hydrALAZINE 25 MG tablet Commonly known as: APRESOLINE Take 1 tablet (25 mg total) by mouth 2 (two) times daily.   ipratropium-albuterol 0.5-2.5 (3) MG/3ML Soln Commonly known as: DUONEB Take 3 mLs by nebulization every 6 (six) hours as needed (wheezing).   loratadine 10 MG tablet Commonly known as: CLARITIN Take 1 tablet (10 mg total) by mouth daily.   metoprolol tartrate 50 MG tablet Commonly known as: LOPRESSOR TAKE 1 TABLET BY MOUTH TWICE A DAY   montelukast 10 MG tablet Commonly known as: SINGULAIR Take 1 tablet (10 mg total) by mouth at bedtime.   ONE TOUCH DELICA LANCING DEV Misc Use to test blood sugar once daily dx: E119   onetouch ultrasoft lancets Use to test blood sugar up to three times daily. Dx E11.9    PARoxetine 30 MG tablet Commonly known as: PAXIL TAKE 1 TABLET BY MOUTH EVERY DAY   sitaGLIPtin 25 MG tablet Commonly known as: Januvia Take 1 tablet (25 mg total) by mouth daily.   sodium chloride 0.65 % Soln nasal spray Commonly known as: OCEAN Place 1 spray into both nostrils as needed for congestion.   traZODone 50 MG tablet Commonly known as: DESYREL TAKE 1 TABLET BY MOUTH EVERYDAY AT BEDTIME   trimethoprim-polymyxin b ophthalmic solution Commonly known as: POLYTRIM INSTILL ONE DROP INTO RIGHT EYE 4 TIMES A DAY FOR 2 DAYS AFTER EACH MONTHLY EYE INJECTION   Vitamin D (Ergocalciferol) 1.25 MG (50000 UNIT) Caps capsule Commonly known as: DRISDOL TAKE 1 CAPSULE BY MOUTH ONE TIME PER WEEK   Zoster Vaccine Adjuvanted injection Commonly known as: SHINGRIX Inject 0.5 mLs into the muscle once for 1 dose.        Review of Systems  Constitutional:  Negative for appetite change, chills, fatigue, fever and unexpected weight change.  HENT:  Positive for hearing loss. Negative for congestion, dental problem, ear discharge, ear pain, facial swelling, nosebleeds, postnasal drip, rhinorrhea, sinus pressure, sinus pain, sneezing, sore throat, tinnitus and trouble swallowing.        Hearing aids bilateral  Eyes:  Positive for visual disturbance. Negative for pain, discharge, redness and itching.       Wears Eye glasses follows up with Opthalomology Dr.Mathews has appt 02/14/2021   Respiratory:  Negative for chest tightness, shortness of breath and wheezing.        Previous cough has improved   Cardiovascular:  Negative for chest pain, palpitations and leg swelling.  Gastrointestinal:  Negative for abdominal distention, abdominal pain, blood in stool, constipation, diarrhea, nausea and vomiting.  Endocrine: Negative for cold intolerance, heat intolerance, polydipsia, polyphagia and polyuria.  Genitourinary:  Negative for difficulty urinating, dysuria, flank pain, frequency and urgency.   Musculoskeletal:  Negative for arthralgias, back pain, gait problem, joint swelling, myalgias, neck pain and neck stiffness.  Skin:  Negative for color change, pallor, rash and wound.  Neurological:  Negative for dizziness, syncope, speech difficulty, weakness, light-headedness, numbness and headaches.  Hematological:  Does not bruise/bleed easily.  Psychiatric/Behavioral:  Negative for agitation, behavioral problems, confusion, hallucinations, self-injury, sleep disturbance and suicidal ideas. The patient is not nervous/anxious.    Immunization History  Administered Date(s) Administered   Fluad Quad(high Dose 65+) 05/27/2019, 03/30/2020   Influenza Split 04/08/2011   Influenza Whole 04/07/2012   Influenza, High Dose Seasonal PF 04/07/2017, 05/25/2018, 07/22/2019   Influenza,inj,Quad PF,6+ Mos 04/23/2013, 03/28/2014, 02/23/2016   Influenza-Unspecified 04/08/2015, 07/09/2015, 04/08/2017   Moderna Sars-Covid-2 Vaccination 10/07/2019, 11/07/2019, 05/10/2020   Pneumococcal Conjugate-13 06/06/2014   Pneumococcal-Unspecified 07/09/2007   Td 08/09/1997   Tdap 08/12/2011   Pertinent  Health Maintenance Due  Topic Date Due   FOOT EXAM  05/26/2020   OPHTHALMOLOGY EXAM  09/27/2020   INFLUENZA VACCINE  02/05/2021   HEMOGLOBIN A1C  04/11/2021   PNA vac Low Risk Adult  Completed   Fall Risk  02/12/2021 01/26/2021 01/02/2021 10/31/2020 10/09/2020  Falls in the past year? 0 0 0 0 0  Number falls in past yr: 0 0 0 0 0  Injury with Fall? 0 0 0 0 0  Risk for fall due to : No Fall Risks No Fall Risks No Fall Risks - -  Follow up Falls evaluation completed Falls evaluation completed Falls evaluation completed - -   Functional Status Survey:    Vitals:   02/12/21 1103  BP: 130/60  Pulse: (!) 50  Resp: 16  Temp: 97.7 F (36.5 C)  SpO2: 94%  Weight: 172 lb 6.4 oz (78.2 kg)  Height: _0  (1.702 m)   Body mass index is 27 kg/m. Physical Exam Vitals reviewed.  Constitutional:      General: He  is not in acute distress.    Appearance: Normal appearance. He is overweight. He is not ill-appearing or diaphoretic.  HENT:     Head: Normocephalic.     Right Ear: Tympanic membrane, ear canal and external ear normal. There is no impacted cerumen.     Left Ear: Tympanic membrane, ear canal and external ear normal. There is no impacted cerumen.     Ears:     Comments: Bilateral Hearing aids in place     Nose: Nose normal. No congestion or rhinorrhea.     Mouth/Throat:     Mouth:  Mucous membranes are moist.     Pharynx: Oropharynx is clear. No oropharyngeal exudate or posterior oropharyngeal erythema.  Eyes:     General: No scleral icterus.       Right eye: No discharge.        Left eye: No discharge.     Extraocular Movements: Extraocular movements intact.     Conjunctiva/sclera: Conjunctivae normal.     Pupils: Pupils are equal, round, and reactive to light.  Neck:     Vascular: No carotid bruit.  Cardiovascular:     Rate and Rhythm: Normal rate and regular rhythm.     Pulses: Normal pulses.     Heart sounds: Normal heart sounds. No murmur heard.   No friction rub. No gallop.  Pulmonary:     Effort: Pulmonary effort is normal. No respiratory distress.     Breath sounds: Normal breath sounds. No wheezing, rhonchi or rales.  Chest:     Chest wall: No tenderness.  Abdominal:     General: Bowel sounds are normal. There is no distension.     Palpations: Abdomen is soft. There is no mass.     Tenderness: There is no abdominal tenderness. There is no right CVA tenderness, left CVA tenderness, guarding or rebound.     Hernia: A hernia is present.     Comments: Mid -abdominal Hernia soft and reducible   Musculoskeletal:        General: No swelling or tenderness. Normal range of motion.     Cervical back: Normal range of motion. No rigidity or tenderness.     Right lower leg: No edema.     Left lower leg: No edema.  Lymphadenopathy:     Cervical: No cervical adenopathy.  Skin:     General: Skin is warm and dry.     Coloration: Skin is not pale.     Findings: No bruising, erythema, lesion or rash.  Neurological:     Mental Status: He is alert. Mental status is at baseline.     Cranial Nerves: No cranial nerve deficit.     Sensory: No sensory deficit.     Motor: No weakness.     Coordination: Coordination normal.     Gait: Gait normal.  Psychiatric:        Mood and Affect: Mood normal.        Speech: Speech normal.        Behavior: Behavior normal.        Thought Content: Thought content normal.        Judgment: Judgment normal.    Labs reviewed: Recent Labs    03/10/20 0000 05/23/20 0821 10/10/20 0926  NA 145 140 143  K 5.1 4.5 4.4  CL 110* 106 111*  CO2 23* 25 23  GLUCOSE  --  120* 141*  BUN 50* 53* 51*  CREATININE 2.8* 2.90* 2.89*  CALCIUM 9.8 9.8 9.9   Recent Labs    03/10/20 0000 10/10/20 0926  AST  --  15  ALT  --  11  BILITOT  --  0.4  PROT  --  6.0*  ALBUMIN 3.6  --    Recent Labs    10/10/20 0926  WBC 6.3  NEUTROABS 3,137  HGB 12.2*  HCT 37.1*  MCV 98.7  PLT 152   No results found for: TSH Lab Results  Component Value Date   HGBA1C 5.8 (H) 10/10/2020   Lab Results  Component Value Date   CHOL 164 10/10/2020  HDL 42 10/10/2020   LDLCALC 99 10/10/2020   TRIG 129 10/10/2020   CHOLHDL 3.9 10/10/2020    Significant Diagnostic Results in last 30 days:  No results found.  Assessment/Plan 1. Need for shingles vaccine Advised to get shingrix vaccine at his pharmacy  - Zoster Vaccine Adjuvanted [facility]) injection; Inject 0.5 mLs into the muscle once for 1 dose.  Dispense: 0.5 mL; Refill: 0  2. Type 2 diabetes mellitus with stage 4 chronic kidney disease, without long-term current use of insulin (HCC) Lab Results  Component Value Date   HGBA1C 5.8 (H) 10/10/2020  On Januvia  No home CBG for review  Recheck A1C then reduce Januvia if remains stable.  - Hemoglobin A1c; Future  3. Essential hypertension B/p at  goal  Continue on hydralazine,metoprolol and amlodipine  On ASA and Atorvastatin  - CBC with Differential/Platelet; Future - CMP with eGFR([facility]); Future - TSH; Future  4. Chronic diastolic CHF (congestive heart failure) (HCC) No signs of fluid overload.  5. COPD with asthma ([place]) Breathing stable  Previous cough has improved  Continue on Duoneb,symbicort and albuterol  - continue on mucinex  - on Singulair and Loratadine    6. Hyperlipidemia associated with type 2 diabetes mellitus ([place]) Latest LDL at goal  - continue on atorvastatin and dietary modification  - Lipid panel; Future  Family/ staff Communication: Reviewed plan of care with patient verbalized understanding   Labs/tests ordered:  - CBC with Differential/Platelet - CMP with eGFR([facility]) - TSH - Hgb A1C - Lipid panel  Next Appointment : 6 months for medical management of chronic issues.Fasting Labs prior to visit.    Sandrea Hughs, NP

## 2021-02-14 ENCOUNTER — Other Ambulatory Visit: Payer: Self-pay

## 2021-02-14 ENCOUNTER — Encounter (INDEPENDENT_AMBULATORY_CARE_PROVIDER_SITE_OTHER): Payer: PPO | Admitting: Ophthalmology

## 2021-02-14 DIAGNOSIS — H353211 Exudative age-related macular degeneration, right eye, with active choroidal neovascularization: Secondary | ICD-10-CM

## 2021-02-14 DIAGNOSIS — H43813 Vitreous degeneration, bilateral: Secondary | ICD-10-CM | POA: Diagnosis not present

## 2021-02-14 DIAGNOSIS — I10 Essential (primary) hypertension: Secondary | ICD-10-CM | POA: Diagnosis not present

## 2021-02-14 DIAGNOSIS — H34832 Tributary (branch) retinal vein occlusion, left eye, with macular edema: Secondary | ICD-10-CM

## 2021-02-14 DIAGNOSIS — H35033 Hypertensive retinopathy, bilateral: Secondary | ICD-10-CM | POA: Diagnosis not present

## 2021-02-26 ENCOUNTER — Other Ambulatory Visit: Payer: Self-pay | Admitting: *Deleted

## 2021-02-26 DIAGNOSIS — F418 Other specified anxiety disorders: Secondary | ICD-10-CM

## 2021-02-26 MED ORDER — METOPROLOL TARTRATE 50 MG PO TABS: 50.0000 mg | ORAL_TABLET | Freq: Two times a day (BID) | ORAL | 1 refills | Status: DC

## 2021-02-26 MED ORDER — PAROXETINE HCL 30 MG PO TABS: 30.0000 mg | ORAL_TABLET | Freq: Every day | ORAL | 1 refills | Status: DC

## 2021-02-26 NOTE — Telephone Encounter (Signed)
Pharmacy requested refill. Pended Rx and sent to [facility] for approval due to HIGH ALERT Warning.  

## 2021-03-13 ENCOUNTER — Other Ambulatory Visit: Payer: Self-pay | Admitting: *Deleted

## 2021-03-13 DIAGNOSIS — E1122 Type 2 diabetes mellitus with diabetic chronic kidney disease: Secondary | ICD-10-CM

## 2021-03-13 MED ORDER — GLUCOSE BLOOD VI STRP: ORAL_STRIP | 3 refills | Status: DC

## 2021-03-13 NOTE — Telephone Encounter (Signed)
Pharmacy requested refill

## 2021-03-26 ENCOUNTER — Telehealth: Payer: Self-pay | Admitting: *Deleted

## 2021-03-26 NOTE — Telephone Encounter (Signed)
Son, [name], called and wanted to know if patient is still suppose to be taking the Montlukast. Stated that patient now has pill pak and it has not been in his pill pak.   I informed son that medication is in patient's current medication list and it looks like the Pulmonologist prescribes. He just sent in a refill for the medication in July with 6 refills.   Son stated that he was going to call and confirm with the pharmacy.

## 2021-03-28 ENCOUNTER — Other Ambulatory Visit: Payer: Self-pay

## 2021-03-28 ENCOUNTER — Encounter (INDEPENDENT_AMBULATORY_CARE_PROVIDER_SITE_OTHER): Payer: PPO | Admitting: Ophthalmology

## 2021-03-28 DIAGNOSIS — I10 Essential (primary) hypertension: Secondary | ICD-10-CM | POA: Diagnosis not present

## 2021-03-28 DIAGNOSIS — H34832 Tributary (branch) retinal vein occlusion, left eye, with macular edema: Secondary | ICD-10-CM

## 2021-03-28 DIAGNOSIS — H43813 Vitreous degeneration, bilateral: Secondary | ICD-10-CM

## 2021-03-28 DIAGNOSIS — H353211 Exudative age-related macular degeneration, right eye, with active choroidal neovascularization: Secondary | ICD-10-CM

## 2021-03-28 DIAGNOSIS — H35033 Hypertensive retinopathy, bilateral: Secondary | ICD-10-CM | POA: Diagnosis not present

## 2021-05-07 DIAGNOSIS — N184 Chronic kidney disease, stage 4 (severe): Secondary | ICD-10-CM | POA: Diagnosis not present

## 2021-05-09 ENCOUNTER — Encounter (INDEPENDENT_AMBULATORY_CARE_PROVIDER_SITE_OTHER): Payer: PPO | Admitting: Ophthalmology

## 2021-05-15 ENCOUNTER — Other Ambulatory Visit: Payer: Self-pay

## 2021-05-15 ENCOUNTER — Encounter (INDEPENDENT_AMBULATORY_CARE_PROVIDER_SITE_OTHER): Payer: PPO | Admitting: Ophthalmology

## 2021-05-15 DIAGNOSIS — I10 Essential (primary) hypertension: Secondary | ICD-10-CM | POA: Diagnosis not present

## 2021-05-15 DIAGNOSIS — H34832 Tributary (branch) retinal vein occlusion, left eye, with macular edema: Secondary | ICD-10-CM

## 2021-05-15 DIAGNOSIS — H353211 Exudative age-related macular degeneration, right eye, with active choroidal neovascularization: Secondary | ICD-10-CM

## 2021-05-15 DIAGNOSIS — H43813 Vitreous degeneration, bilateral: Secondary | ICD-10-CM

## 2021-05-15 DIAGNOSIS — H35033 Hypertensive retinopathy, bilateral: Secondary | ICD-10-CM | POA: Diagnosis not present

## 2021-05-18 DIAGNOSIS — I129 Hypertensive chronic kidney disease with stage 1 through stage 4 chronic kidney disease, or unspecified chronic kidney disease: Secondary | ICD-10-CM | POA: Diagnosis not present

## 2021-05-18 DIAGNOSIS — N184 Chronic kidney disease, stage 4 (severe): Secondary | ICD-10-CM | POA: Diagnosis not present

## 2021-05-18 DIAGNOSIS — N2581 Secondary hyperparathyroidism of renal origin: Secondary | ICD-10-CM | POA: Diagnosis not present

## 2021-05-27 ENCOUNTER — Other Ambulatory Visit: Payer: Self-pay | Admitting: Nurse Practitioner

## 2021-05-27 DIAGNOSIS — N1831 Chronic kidney disease, stage 3a: Secondary | ICD-10-CM

## 2021-06-25 ENCOUNTER — Encounter (INDEPENDENT_AMBULATORY_CARE_PROVIDER_SITE_OTHER): Payer: PPO | Admitting: Ophthalmology

## 2021-06-25 ENCOUNTER — Ambulatory Visit (INDEPENDENT_AMBULATORY_CARE_PROVIDER_SITE_OTHER): Payer: PPO | Admitting: Nurse Practitioner

## 2021-06-25 ENCOUNTER — Other Ambulatory Visit: Payer: Self-pay

## 2021-06-25 ENCOUNTER — Encounter: Payer: Self-pay | Admitting: Nurse Practitioner

## 2021-06-25 VITALS — BP 116/58 | HR 56 | Temp 97.1°F | Ht 67.0 in | Wt 175.0 lb

## 2021-06-25 DIAGNOSIS — I10 Essential (primary) hypertension: Secondary | ICD-10-CM

## 2021-06-25 DIAGNOSIS — N184 Chronic kidney disease, stage 4 (severe): Secondary | ICD-10-CM

## 2021-06-25 DIAGNOSIS — D649 Anemia, unspecified: Secondary | ICD-10-CM | POA: Diagnosis not present

## 2021-06-25 DIAGNOSIS — E559 Vitamin D deficiency, unspecified: Secondary | ICD-10-CM | POA: Diagnosis not present

## 2021-06-25 DIAGNOSIS — H35033 Hypertensive retinopathy, bilateral: Secondary | ICD-10-CM

## 2021-06-25 DIAGNOSIS — H2512 Age-related nuclear cataract, left eye: Secondary | ICD-10-CM | POA: Diagnosis not present

## 2021-06-25 DIAGNOSIS — E1122 Type 2 diabetes mellitus with diabetic chronic kidney disease: Secondary | ICD-10-CM

## 2021-06-25 DIAGNOSIS — Z23 Encounter for immunization: Secondary | ICD-10-CM | POA: Diagnosis not present

## 2021-06-25 DIAGNOSIS — F325 Major depressive disorder, single episode, in full remission: Secondary | ICD-10-CM | POA: Diagnosis not present

## 2021-06-25 DIAGNOSIS — H43813 Vitreous degeneration, bilateral: Secondary | ICD-10-CM

## 2021-06-25 DIAGNOSIS — I5032 Chronic diastolic (congestive) heart failure: Secondary | ICD-10-CM | POA: Diagnosis not present

## 2021-06-25 DIAGNOSIS — H34832 Tributary (branch) retinal vein occlusion, left eye, with macular edema: Secondary | ICD-10-CM | POA: Diagnosis not present

## 2021-06-25 DIAGNOSIS — H353211 Exudative age-related macular degeneration, right eye, with active choroidal neovascularization: Secondary | ICD-10-CM

## 2021-06-25 DIAGNOSIS — J449 Chronic obstructive pulmonary disease, unspecified: Secondary | ICD-10-CM

## 2021-06-25 DIAGNOSIS — E1169 Type 2 diabetes mellitus with other specified complication: Secondary | ICD-10-CM | POA: Diagnosis not present

## 2021-06-25 DIAGNOSIS — E785 Hyperlipidemia, unspecified: Secondary | ICD-10-CM

## 2021-06-25 DIAGNOSIS — R413 Other amnesia: Secondary | ICD-10-CM | POA: Diagnosis not present

## 2021-06-25 MED ORDER — BUDESONIDE-FORMOTEROL FUMARATE 160-4.5 MCG/ACT IN AERO: INHALATION_SPRAY | RESPIRATORY_TRACT | 5 refills | Status: DC

## 2021-06-25 MED ORDER — ALBUTEROL SULFATE HFA 108 (90 BASE) MCG/ACT IN AERS: 2.0000 | INHALATION_SPRAY | Freq: Four times a day (QID) | RESPIRATORY_TRACT | 5 refills | Status: DC | PRN

## 2021-06-25 NOTE — Progress Notes (Signed)
Careteam: Patient Care Team: Lauree Chandler, NP as PCP - General (Geriatric Medicine) Zebedee Iba., MD as Referring Physician (Ophthalmology) Chesley Mires, MD as Consulting Physician (Pulmonary Disease) Lavonna Monarch, MD as Consulting Physician (Dermatology)  PLACE OF SERVICE:  [place] Directive information Does Patient Have a Medical Advance Directive?: Yes, Type of Advance Directive: [place];Living will, Does patient want to make changes to medical advance directive?: No - Patient declined  Allergies  Allergen Reactions   Ace Inhibitors Cough    Chief Complaint  Patient presents with   Acute Visit    Per patient request to be seen prior to Christmas (routine visit not due until Feb 2023). Discuss need for shingrix, PCV, covid booster, eye exam, and flu vaccine or postpone if patient refuses. Foot exam today. Patient requesting refill on inhaler.      HPI: Patient is a 85 y.o. male for follow up.  No concerns.   COPD/asthma-Reports his breathing has been doing good- using inhaler but only once daily   DM- reports he has his scheduled eye exam for this week. On januvia 25 mg daily. No hypoglemia.  Nails are long but do not bother him.   Depression- in remission. Reports his children help him since his wife died 4 years ago. He is doing well on paxil.   Htn- controlled on norvasc, hydralazine, metoprolol   Hyperlipidemia- continues on lipitor 80 mg with dietary modifications.   Vit d def- on supplement weekly    Review of Systems:  Review of Systems  Constitutional:  Negative for chills, fever and weight loss.  HENT:  Negative for tinnitus.   Respiratory:  Negative for cough, sputum production and shortness of breath.   Cardiovascular:  Negative for chest pain, palpitations and leg swelling.  Gastrointestinal:  Negative for abdominal pain, constipation, diarrhea and heartburn.  Genitourinary:  Negative for dysuria,  frequency and urgency.  Musculoskeletal:  Negative for back pain, falls, joint pain and myalgias.  Skin: Negative.   Neurological:  Negative for dizziness and headaches.  Psychiatric/Behavioral:  Negative for depression and memory loss. The patient does not have insomnia.    Past Medical History:  Diagnosis Date   Allergy    Alzheimer's disease ([place])    Anemia, unspecified    Anxiety    Chronic maxillary sinusitis    COPD (chronic obstructive pulmonary disease) ([place])    Depression    Diabetes mellitus    Edema    Hypercalcemia    Hyperlipidemia    Hypertension    Hypertrophy of prostate without urinary obstruction and other lower urinary tract symptoms (LUTS)    Hypertrophy of prostate without urinary obstruction and other lower urinary tract symptoms (LUTS)    Kidney disease    mild   Unspecified disorder of kidney and ureter    Unspecified sleep apnea    Ventral hernia, unspecified, without mention of obstruction or gangrene    Past Surgical History:  Procedure Laterality Date   EXPLORATORY LAPAROTOMY  2002   HERNIA REPAIR     Social History:   reports that he quit smoking about 32 years ago. His smoking use included cigarettes. He has a 45.00 pack-year smoking history. He has never used smokeless tobacco. He reports that he does not drink alcohol and does not use drugs.  Family History  Problem Relation Age of Onset   Pancreatic cancer Sister    Heart disease Father    Hypertension Brother  Hypertension Sister     Medications: Patient's Medications  New Prescriptions   No medications on file  Previous Medications   ACETAMINOPHEN (TYLENOL) 500 MG TABLET    Take 1,000 mg by mouth every 8 (eight) hours as needed.   ALBUTEROL (VENTOLIN HFA) 108 (90 BASE) MCG/ACT INHALER    Inhale 2 puffs into the lungs every 6 (six) hours as needed for wheezing or shortness of breath.   AMLODIPINE (NORVASC) 10 MG TABLET    Take 1 tablet (10 mg total) by mouth daily.   ASPIRIN  (ASPIRIN LOW DOSE) 81 MG EC TABLET    Take 1 tablet (81 mg total) by mouth daily. Swallow whole.   ATORVASTATIN (LIPITOR) 80 MG TABLET    Take 1 tablet (80 mg total) by mouth daily.   BESIVANCE 0.6 % SUSP    INSTILL ONE DROP INTO RIGHT EYE 4 TIMES A DAY FOR 2 DAYS AFTER EACH MONTHLY EYE INJECTION   BISACODYL 5 MG EC TABLET    Take 1 tablet (5 mg total) by mouth daily as needed for moderate constipation.   BLOOD GLUCOSE METER KIT AND SUPPLIES KIT    Inject 1 each into the skin 2 (two) times daily. Dispense based on patient and insurance preference. Use up to four times daily as directed. (FOR ICD-9 250.00, 250.01).   BUDESONIDE-FORMOTEROL (SYMBICORT) 160-4.5 MCG/ACT INHALER    TAKE TWO PUFFS BY MOUTH TWICE DAILY   DICLOFENAC SODIUM (VOLTAREN) 1 % GEL    Apply 2 g topically 4 (four) times daily as needed (shoulder pain).   FLUTICASONE (FLONASE) 50 MCG/ACT NASAL SPRAY    Place 1 spray into both nostrils daily.   GLUCOSE BLOOD (ONE TOUCH ULTRA TEST) TEST STRIP    Use to test blood sugar twice daily.  DX: E11.9   GUAIFENESIN (MUCINEX) 600 MG 12 HR TABLET    Take 2 tablets (1,200 mg total) by mouth 2 (two) times daily as needed for cough or to loosen phlegm.   HYDRALAZINE (APRESOLINE) 25 MG TABLET    TAKE 1 TABLET BY MOUTH TWICE A DAY   IPRATROPIUM-ALBUTEROL (DUONEB) 0.5-2.5 (3) MG/3ML SOLN    Take 3 mLs by nebulization every 6 (six) hours as needed (wheezing).   LANCET DEVICES (ONE TOUCH DELICA LANCING DEV) MISC    Use to test blood sugar once daily dx: E119   LANCETS (ONETOUCH ULTRASOFT) LANCETS    Use to test blood sugar up to three times daily. Dx E11.9   LORATADINE (CLARITIN) 10 MG TABLET    Take 1 tablet (10 mg total) by mouth daily.   METOPROLOL TARTRATE (LOPRESSOR) 50 MG TABLET    Take 1 tablet (50 mg total) by mouth 2 (two) times daily.   MONTELUKAST (SINGULAIR) 10 MG TABLET    TAKE 1 TABLET BY MOUTH EVERYDAY AT BEDTIME   PAROXETINE (PAXIL) 30 MG TABLET    Take 1 tablet (30 mg total) by mouth  daily.   SITAGLIPTIN (JANUVIA) 25 MG TABLET    Take 1 tablet (25 mg total) by mouth daily.   SODIUM CHLORIDE (OCEAN) 0.65 % SOLN NASAL SPRAY    Place 1 spray into both nostrils as needed for congestion.   TRAZODONE (DESYREL) 50 MG TABLET    TAKE 1 TABLET BY MOUTH EVERYDAY AT BEDTIME   TRIMETHOPRIM-POLYMYXIN B (POLYTRIM) OPHTHALMIC SOLUTION    INSTILL ONE DROP INTO RIGHT EYE 4 TIMES A DAY FOR 2 DAYS AFTER EACH MONTHLY EYE INJECTION   VITAMIN D, ERGOCALCIFEROL, (DRISDOL) 1.25  MG (50000 UNIT) CAPS CAPSULE    TAKE 1 CAPSULE BY MOUTH ONE TIME PER WEEK  Modified Medications   No medications on file  Discontinued Medications   No medications on file    Physical Exam:  Vitals:   06/25/21 1307  BP: (!) 116/58  Pulse: (!) 56  Temp: (!) 97.1 F (36.2 C)  TempSrc: Temporal  SpO2: 93%  Weight: 175 lb (79.4 kg)  Height: 5' 7"  (1.702 m)   Body mass index is 27.41 kg/m. Wt Readings from Last 3 Encounters:  06/25/21 175 lb (79.4 kg)  02/12/21 172 lb 6.4 oz (78.2 kg)  01/26/21 172 lb 9.6 oz (78.3 kg)    Physical Exam Constitutional:      General: He is not in acute distress.    Appearance: He is well-developed. He is not diaphoretic.  HENT:     Head: Normocephalic and atraumatic.     Right Ear: External ear normal.     Left Ear: External ear normal.     Mouth/Throat:     Pharynx: No oropharyngeal exudate.  Eyes:     Conjunctiva/sclera: Conjunctivae normal.     Pupils: Pupils are equal, round, and reactive to light.  Cardiovascular:     Rate and Rhythm: Normal rate and regular rhythm.     Heart sounds: Normal heart sounds.  Pulmonary:     Effort: Pulmonary effort is normal.     Breath sounds: Normal breath sounds.  Abdominal:     General: Bowel sounds are normal.     Palpations: Abdomen is soft.  Musculoskeletal:        General: No tenderness.     Cervical back: Normal range of motion and neck supple.     Right lower leg: No edema.     Left lower leg: No edema.  Skin:     General: Skin is warm and dry.  Neurological:     Mental Status: He is alert and oriented to person, place, and time.    Labs reviewed: Basic Metabolic Panel: Recent Labs    10/10/20 0926  NA 143  K 4.4  CL 111*  CO2 23  GLUCOSE 141*  BUN 51*  CREATININE 2.89*  CALCIUM 9.9   Liver Function Tests: Recent Labs    10/10/20 0926  AST 15  ALT 11  BILITOT 0.4  PROT 6.0*   No results for input(s): LIPASE, AMYLASE in the last 8760 hours. No results for input(s): AMMONIA in the last 8760 hours. CBC: Recent Labs    10/10/20 0926  WBC 6.3  NEUTROABS 3,137  HGB 12.2*  HCT 37.1*  MCV 98.7  PLT 152   Lipid Panel: Recent Labs    10/10/20 0926  CHOL 164  HDL 42  LDLCALC 99  TRIG 129  CHOLHDL 3.9   TSH: No results for input(s): TSH in the last 8760 hours. A1C: Lab Results  Component Value Date   HGBA1C 5.8 (H) 10/10/2020     Assessment/Plan 1. COPD with asthma ([place]) -controlled at this time, encouraged to use symbicort twice daily - albuterol (VENTOLIN HFA) 108 (90 Base) MCG/ACT inhaler; Inhale 2 puffs into the lungs every 6 (six) hours as needed for wheezing or shortness of breath.  Dispense: 8.5 each; Refill: 5 - budesonide-formoterol (SYMBICORT) 160-4.5 MCG/ACT inhaler; TAKE TWO PUFFS BY MOUTH TWICE DAILY  Dispense: 10.2 g; Refill: 5  2. Essential hypertension -Blood pressure well controlled Continue current medications Recheck metabolic panel  3. Chronic diastolic CHF (congestive heart failure) (  [place]) -stable, no signs of fluid overload. Continues on hydralazine and metoprolol.   4. Type 2 diabetes mellitus with stage 4 chronic kidney disease, without long-term current use of insulin ([place]) -Encouraged dietary compliance, routine foot care/monitoring and to keep up with diabetic eye exams through ophthalmology  -continues on januvia, may be able to stop medication if A1c continues to be well controlled.  - Hemoglobin A1c - Flu Vaccine QUAD High  Dose(Fluad)  5. Hyperlipidemia associated with type 2 diabetes mellitus ([place]) -continues on lipitor.  - CMP with eGFR([facility])  6. Chronic kidney disease, stage 4 (severe) ([facility]) -Encourage proper hydration and to avoid NSAIDS (Aleve, Advil, Motrin, Ibuprofen)  - CBC with Differential/Platelet - CMP with eGFR([facility])  7. Major depressive disorder with single episode, in full remission ([place]) -stable on paxil  8. Vitamin D deficiency -on weekly supplement - Vitamin D, 25-hydroxy  9. Need for influenza vaccination - Flu Vaccine QUAD High Dose(Fluad)  Janett Billow K. Hooks, [facility] [phone]

## 2021-06-25 NOTE — Patient Instructions (Addendum)
Please sign a record release for Dr Rodena Piety ophthalmologist on check out.   To get COVID booster at local pharmacy Also can get shingrix at pharmacy

## 2021-06-26 ENCOUNTER — Other Ambulatory Visit: Payer: Self-pay | Admitting: Nurse Practitioner

## 2021-06-26 DIAGNOSIS — E1122 Type 2 diabetes mellitus with diabetic chronic kidney disease: Secondary | ICD-10-CM

## 2021-06-27 ENCOUNTER — Encounter (INDEPENDENT_AMBULATORY_CARE_PROVIDER_SITE_OTHER): Payer: PPO | Admitting: Ophthalmology

## 2021-06-27 ENCOUNTER — Ambulatory Visit: Payer: PPO | Admitting: Nurse Practitioner

## 2021-06-29 LAB — TEST AUTHORIZATION 2

## 2021-06-29 LAB — CBC WITH DIFFERENTIAL/PLATELET
Absolute Monocytes: 787 cells/uL (ref 200–950)
Basophils Absolute: 59 cells/uL (ref 0–200)
Basophils Relative: 0.9 %
Eosinophils Absolute: 208 cells/uL (ref 15–500)
Eosinophils Relative: 3.2 %
HCT: 33 % — ABNORMAL LOW (ref 38.5–50.0)
Hemoglobin: 10.9 g/dL — ABNORMAL LOW (ref 13.2–17.1)
Lymphs Abs: 2126 cells/uL (ref 850–3900)
MCH: 33.1 pg — ABNORMAL HIGH (ref 27.0–33.0)
MCHC: 33 g/dL (ref 32.0–36.0)
MCV: 100.3 fL — ABNORMAL HIGH (ref 80.0–100.0)
MPV: 10.4 fL (ref 7.5–12.5)
Monocytes Relative: 12.1 %
Neutro Abs: 3322 cells/uL (ref 1500–7800)
Neutrophils Relative %: 51.1 %
Platelets: 128 10*3/uL — ABNORMAL LOW (ref 140–400)
RBC: 3.29 10*6/uL — ABNORMAL LOW (ref 4.20–5.80)
RDW: 13.1 % (ref 11.0–15.0)
Total Lymphocyte: 32.7 %
WBC: 6.5 10*3/uL (ref 3.8–10.8)

## 2021-06-29 LAB — IRON, TOTAL/TOTAL IRON BINDING CAP
%SAT: 25 % (calc) (ref 20–48)
Iron: 67 ug/dL (ref 50–180)
TIBC: 269 mcg/dL (calc) (ref 250–425)

## 2021-06-29 LAB — TEST AUTHORIZATION 3

## 2021-06-29 LAB — COMPLETE METABOLIC PANEL WITH GFR
AG Ratio: 1.6 (calc) (ref 1.0–2.5)
ALT: 14 U/L (ref 9–46)
AST: 17 U/L (ref 10–35)
Albumin: 3.6 g/dL (ref 3.6–5.1)
Alkaline phosphatase (APISO): 60 U/L (ref 35–144)
BUN/Creatinine Ratio: 16 (calc) (ref 6–22)
BUN: 47 mg/dL — ABNORMAL HIGH (ref 7–25)
CO2: 25 mmol/L (ref 20–32)
Calcium: 9.5 mg/dL (ref 8.6–10.3)
Chloride: 109 mmol/L (ref 98–110)
Creat: 2.95 mg/dL — ABNORMAL HIGH (ref 0.70–1.22)
Globulin: 2.3 g/dL (calc) (ref 1.9–3.7)
Glucose, Bld: 99 mg/dL (ref 65–139)
Potassium: 4.4 mmol/L (ref 3.5–5.3)
Sodium: 142 mmol/L (ref 135–146)
Total Bilirubin: 0.3 mg/dL (ref 0.2–1.2)
Total Protein: 5.9 g/dL — ABNORMAL LOW (ref 6.1–8.1)
eGFR: 20 mL/min/{1.73_m2} — ABNORMAL LOW (ref >=60)

## 2021-06-29 LAB — TEST AUTHORIZATION

## 2021-06-29 LAB — HEMOGLOBIN A1C
Hgb A1c MFr Bld: 5.8 % of total Hgb — ABNORMAL HIGH (ref <5.7)
Mean Plasma Glucose: 120 mg/dL
eAG (mmol/L): 6.6 mmol/L

## 2021-06-29 LAB — FERRITIN: Ferritin: 30 ng/mL (ref 24–380)

## 2021-06-29 LAB — VITAMIN B12: Vitamin B-12: 492 pg/mL (ref 200–1100)

## 2021-06-29 LAB — VITAMIN D 25 HYDROXY (VIT D DEFICIENCY, FRACTURES): Vit D, 25-Hydroxy: 45 ng/mL (ref 30–100)

## 2021-07-03 ENCOUNTER — Other Ambulatory Visit: Payer: Self-pay

## 2021-07-03 DIAGNOSIS — N184 Chronic kidney disease, stage 4 (severe): Secondary | ICD-10-CM

## 2021-07-03 DIAGNOSIS — D649 Anemia, unspecified: Secondary | ICD-10-CM

## 2021-07-10 ENCOUNTER — Other Ambulatory Visit: Payer: Self-pay

## 2021-07-10 ENCOUNTER — Other Ambulatory Visit: Payer: PPO

## 2021-07-10 ENCOUNTER — Telehealth: Payer: Self-pay

## 2021-07-10 DIAGNOSIS — D649 Anemia, unspecified: Secondary | ICD-10-CM

## 2021-07-10 LAB — CBC WITH DIFFERENTIAL/PLATELET
Absolute Monocytes: 1009 cells/uL — ABNORMAL HIGH (ref 200–950)
Basophils Absolute: 74 cells/uL (ref 0–200)
Basophils Relative: 0.9 %
Eosinophils Absolute: 353 cells/uL (ref 15–500)
Eosinophils Relative: 4.3 %
HCT: 31.8 % — ABNORMAL LOW (ref 38.5–50.0)
Hemoglobin: 10.4 g/dL — ABNORMAL LOW (ref 13.2–17.1)
Lymphs Abs: 2665 cells/uL (ref 850–3900)
MCH: 32.5 pg (ref 27.0–33.0)
MCHC: 32.7 g/dL (ref 32.0–36.0)
MCV: 99.4 fL (ref 80.0–100.0)
MPV: 10.3 fL (ref 7.5–12.5)
Monocytes Relative: 12.3 %
Neutro Abs: 4100 cells/uL (ref 1500–7800)
Neutrophils Relative %: 50 %
Platelets: 127 10*3/uL — ABNORMAL LOW (ref 140–400)
RBC: 3.2 10*6/uL — ABNORMAL LOW (ref 4.20–5.80)
RDW: 13.1 % (ref 11.0–15.0)
Total Lymphocyte: 32.5 %
WBC: 8.2 10*3/uL (ref 3.8–10.8)

## 2021-07-10 NOTE — Telephone Encounter (Signed)
Incoming call received from patients son questioning again why we decided to stop Januvia as he is worried that will cause his father to have elevated blood sugars and patient has a long-term history of Diabetes.  Refer to lab results dated 06/25/2021.  I discussed Jessica's comments with [name] and he verbalized understanding

## 2021-07-16 ENCOUNTER — Other Ambulatory Visit: Payer: Self-pay

## 2021-07-16 DIAGNOSIS — D696 Thrombocytopenia, unspecified: Secondary | ICD-10-CM

## 2021-07-17 ENCOUNTER — Other Ambulatory Visit: Payer: Self-pay | Admitting: Nurse Practitioner

## 2021-07-17 NOTE — Telephone Encounter (Signed)
Patient has request refill on medication "Trazodone 50mg ". Patient medication last refilled in July 2022. Patient medication has High Risk Warnings. Medication pend and sent to PCP Dewaine Oats Carlos American, NP for approval. Please Advise.

## 2021-07-18 ENCOUNTER — Other Ambulatory Visit: Payer: Self-pay | Admitting: *Deleted

## 2021-07-18 ENCOUNTER — Telehealth: Payer: Self-pay | Admitting: *Deleted

## 2021-07-18 DIAGNOSIS — D649 Anemia, unspecified: Secondary | ICD-10-CM

## 2021-07-18 NOTE — Telephone Encounter (Signed)
Patient son, [name] called and stated that he wanted to let Dinah know, due to labwork, that patient has been sleeping late and alittle more depressed.   Stated that patient usually gets up early in the morning but lately has been getting up late.   Son is wondering if this is contributing to anything.   Please Advise.

## 2021-07-18 NOTE — Telephone Encounter (Signed)
Recommend evaluation further at the office for depression and rule out any other signs of infection too.

## 2021-07-18 NOTE — Progress Notes (Signed)
New patient appt with Dr Benay Spice on 08/06/21 and labs same day. Lab orders entered

## 2021-07-18 NOTE — Telephone Encounter (Signed)
Son scheduled an appointment for Friday 1/13 with [place].

## 2021-07-20 ENCOUNTER — Other Ambulatory Visit: Payer: Self-pay

## 2021-07-20 ENCOUNTER — Encounter: Payer: Self-pay | Admitting: Family

## 2021-07-20 ENCOUNTER — Ambulatory Visit (INDEPENDENT_AMBULATORY_CARE_PROVIDER_SITE_OTHER): Payer: PPO | Admitting: Family

## 2021-07-20 VITALS — BP 100/60 | HR 60 | Temp 97.6°F | Resp 16 | Ht 67.0 in | Wt 166.4 lb

## 2021-07-20 DIAGNOSIS — F418 Other specified anxiety disorders: Secondary | ICD-10-CM | POA: Diagnosis not present

## 2021-07-20 DIAGNOSIS — J302 Other seasonal allergic rhinitis: Secondary | ICD-10-CM | POA: Diagnosis not present

## 2021-07-20 MED ORDER — LORATADINE 10 MG PO TABS: 10.0000 mg | ORAL_TABLET | Freq: Every day | ORAL | 5 refills | Status: DC

## 2021-07-20 MED ORDER — PAROXETINE HCL 40 MG PO TABS: 40.0000 mg | ORAL_TABLET | Freq: Every day | ORAL | 0 refills | Status: DC

## 2021-07-20 NOTE — Patient Instructions (Signed)
Increase Paxil from 30 mg to 40 mg tablet one by mouth daily   - Take Loratadine 10 mg tablet one by mouth daily   - Notify provider if depression symptoms worst   - check weight at least three times per week.Notify provider for progressive weight loss

## 2021-07-20 NOTE — Progress Notes (Signed)
Provider: Dajiah Kooi FNP-C  Lauree Chandler, NP  Patient Care Team: Lauree Chandler, NP as PCP - General (Geriatric Medicine) Zebedee Iba., MD as Referring Physician (Ophthalmology) Chesley Mires, MD as Consulting Physician (Pulmonary Disease) Lavonna Monarch, MD as Consulting Physician (Dermatology)  Extended Emergency Contact Information Primary Emergency Contact: Lewing,Mike Mobile Phone: [phone] Relation: Son Secondary Emergency Contact: Laprise,Matt  [place] Mobile Phone: [phone] Relation: Son  Code Status:  DNR Goals of care: Advanced Directive information Advanced Directives 07/20/2021  Does Patient Have a Medical Advance Directive? Yes  Type of Paramedic of [place];Living will  Does patient want to make changes to medical advance directive? No - Patient declined  Copy of [place] in Chart? Yes - validated most recent copy scanned in chart (See row information)  Would patient like information on creating a medical advance directive? -  Pre-existing out of facility DNR order (yellow form or pink MOST form) -     Chief Complaint  Patient presents with   Acute Visit    Patient complains of fatigue, and depression.     HPI:  Pt is a 86 y.o. male seen today for an acute visit for evaluation of fatigue and depression.states not sleeping well.usually goes to [facility] 5 x per week but does not have any energy.No thoughts  suicide ideation or injury to self or others. Denies any fever,chills,cough or signs of UTI.    Past Medical History:  Diagnosis Date   Allergy    Alzheimer's disease ([place])    Anemia, unspecified    Anxiety    Chronic maxillary sinusitis    COPD (chronic obstructive pulmonary disease) ([facility])    Depression    Diabetes mellitus    Edema    Hypercalcemia    Hyperlipidemia    Hypertension    Hypertrophy of prostate without urinary obstruction and other lower urinary tract  symptoms (LUTS)    Hypertrophy of prostate without urinary obstruction and other lower urinary tract symptoms (LUTS)    Kidney disease    mild   Unspecified disorder of kidney and ureter    Unspecified sleep apnea    Ventral hernia, unspecified, without mention of obstruction or gangrene    Past Surgical History:  Procedure Laterality Date   EXPLORATORY LAPAROTOMY  2002   HERNIA REPAIR      Allergies  Allergen Reactions   Ace Inhibitors Cough    Outpatient Encounter Medications as of 07/20/2021  Medication Sig   acetaminophen (TYLENOL) 500 MG tablet Take 1,000 mg by mouth every 8 (eight) hours as needed.   albuterol (VENTOLIN HFA) 108 (90 Base) MCG/ACT inhaler Inhale 2 puffs into the lungs every 6 (six) hours as needed for wheezing or shortness of breath.   amLODipine (NORVASC) 10 MG tablet TAKE 1 TABLET BY MOUTH EVERY DAY   ASPIRIN LOW DOSE 81 MG EC tablet TAKE 1 TABLET BY MOUTH DAILY SWALLOW WHOLE   atorvastatin (LIPITOR) 80 MG tablet TAKE 1 TABLET BY MOUTH EVERY DAY   BESIVANCE 0.6 % SUSP INSTILL ONE DROP INTO RIGHT EYE 4 TIMES A DAY FOR 2 DAYS AFTER EACH MONTHLY EYE INJECTION   bisacodyl 5 MG EC tablet Take 1 tablet (5 mg total) by mouth daily as needed for moderate constipation.   blood glucose meter kit and supplies KIT Inject 1 each into the skin 2 (two) times daily. Dispense based on patient and insurance preference. Use up to four times daily as  directed. (FOR ICD-9 250.00, 250.01).   budesonide-formoterol (SYMBICORT) 160-4.5 MCG/ACT inhaler TAKE TWO PUFFS BY MOUTH TWICE DAILY   diclofenac sodium (VOLTAREN) 1 % GEL Apply 2 g topically 4 (four) times daily as needed (shoulder pain).   fluticasone (FLONASE) 50 MCG/ACT nasal spray Place 1 spray into both nostrils daily.   glucose blood (ONE TOUCH ULTRA TEST) test strip Use to test blood sugar twice daily.  DX: E11.9   guaiFENesin (MUCINEX) 600 MG 12 hr tablet Take 2 tablets (1,200 mg total) by mouth 2 (two) times daily as needed  for cough or to loosen phlegm.   hydrALAZINE (APRESOLINE) 25 MG tablet TAKE 1 TABLET BY MOUTH TWICE A DAY   ipratropium-albuterol (DUONEB) 0.5-2.5 (3) MG/3ML SOLN Take 3 mLs by nebulization every 6 (six) hours as needed (wheezing).   Lancet Devices (ONE TOUCH DELICA LANCING DEV) MISC Use to test blood sugar once daily dx: E119   Lancets (ONETOUCH ULTRASOFT) lancets Use to test blood sugar up to three times daily. Dx E11.9   loratadine (CLARITIN) 10 MG tablet Take 1 tablet (10 mg total) by mouth daily.   metoprolol tartrate (LOPRESSOR) 50 MG tablet Take 1 tablet (50 mg total) by mouth 2 (two) times daily.   montelukast (SINGULAIR) 10 MG tablet TAKE 1 TABLET BY MOUTH EVERYDAY AT BEDTIME   PARoxetine (PAXIL) 30 MG tablet Take 1 tablet (30 mg total) by mouth daily.   sodium chloride (OCEAN) 0.65 % SOLN nasal spray Place 1 spray into both nostrils as needed for congestion.   traZODone (DESYREL) 50 MG tablet TAKE 1 TABLET BY MOUTH EVERYDAY AT BEDTIME   trimethoprim-polymyxin b (POLYTRIM) ophthalmic solution INSTILL ONE DROP INTO RIGHT EYE 4 TIMES A DAY FOR 2 DAYS AFTER EACH MONTHLY EYE INJECTION   Vitamin D, Ergocalciferol, (DRISDOL) 1.25 MG (50000 UNIT) CAPS capsule TAKE 1 CAPSULE BY MOUTH ONE TIME PER WEEK   [DISCONTINUED] JANUVIA 25 MG tablet TAKE 1 TABLET (25 MG TOTAL) BY MOUTH DAILY.   No facility-administered encounter medications on file as of 07/20/2021.    Review of Systems  Constitutional:  Negative for appetite change, chills, fatigue, fever and unexpected weight change.  HENT:  Positive for postnasal drip and rhinorrhea. Negative for congestion, dental problem, ear discharge, ear pain, facial swelling, hearing loss, nosebleeds, sinus pressure, sinus pain, sneezing, sore throat, tinnitus and trouble swallowing.   Eyes:  Negative for pain, discharge, redness, itching and visual disturbance.  Respiratory:  Negative for cough, chest tightness, shortness of breath and wheezing.    Cardiovascular:  Negative for chest pain, palpitations and leg swelling.  Gastrointestinal:  Negative for abdominal distention, abdominal pain, blood in stool, constipation, diarrhea, nausea and vomiting.  Endocrine: Negative for cold intolerance, heat intolerance, polydipsia, polyphagia and polyuria.  Genitourinary:  Negative for difficulty urinating, dysuria, flank pain, frequency and urgency.  Musculoskeletal:  Negative for arthralgias, back pain, gait problem, joint swelling, myalgias, neck pain and neck stiffness.  Skin:  Negative for color change, pallor, rash and wound.  Neurological:  Negative for dizziness, syncope, speech difficulty, weakness, light-headedness, numbness and headaches.  Hematological:  Does not bruise/bleed easily.  Psychiatric/Behavioral:  Negative for agitation, behavioral problems, confusion, hallucinations, self-injury, sleep disturbance and suicidal ideas. The patient is not nervous/anxious.    Immunization History  Administered Date(s) Administered   Fluad Quad(high Dose 65+) 05/27/2019, 03/30/2020, 06/25/2021   Influenza Split 04/08/2011   Influenza Whole 04/07/2012   Influenza, High Dose Seasonal PF 04/07/2017, 05/25/2018, 07/22/2019   Influenza,inj,Quad PF,6+ Mos  04/23/2013, 03/28/2014, 02/23/2016   Influenza-Unspecified 04/08/2015, 07/09/2015, 04/08/2017   Moderna Sars-Covid-2 Vaccination 10/07/2019, 11/07/2019, 05/10/2020   Pneumococcal Conjugate-13 06/06/2014   Pneumococcal-Unspecified 07/09/2007   Td 08/09/1997   Tdap 08/12/2011   Pertinent  Health Maintenance Due  Topic Date Due   OPHTHALMOLOGY EXAM  10/04/2021   HEMOGLOBIN A1C  12/24/2021   FOOT EXAM  06/25/2022   INFLUENZA VACCINE  Completed   Fall Risk 01/02/2021 01/26/2021 02/12/2021 06/25/2021 07/20/2021  Falls in the past year? 0 0 0 0 0  Was there an injury with Fall? 0 0 0 0 0  Fall Risk Category Calculator 0 0 0 0 0  Fall Risk Category _0   Patient Fall Risk Level Low  fall risk Low fall risk Low fall risk Low fall risk Low fall risk  Patient at Risk for Falls Due to _1   Fall risk Follow up _2    Functional Status Survey:    Vitals:   07/20/21 1456  BP: 100/60  Pulse: 60  Resp: 16  Temp: 97.6 F (36.4 C)  SpO2: 96%  Weight: 166 lb 6.4 oz (75.5 kg)  Height: _3  (1.702 m)   Body mass index is 26.06 kg/m. Physical Exam Vitals reviewed.  Constitutional:      General: He is not in acute distress.    Appearance: Normal appearance. He is normal weight. He is not ill-appearing or diaphoretic.  HENT:     Head: Normocephalic.     Right Ear: Tympanic membrane, ear canal and external ear normal. There is no impacted cerumen.     Left Ear: Tympanic membrane, ear canal and external ear normal. There is no impacted cerumen.     Nose: Nose normal. No congestion or rhinorrhea.     Mouth/Throat:     Mouth: Mucous membranes are moist.     Pharynx: Oropharynx is clear. No oropharyngeal exudate or posterior oropharyngeal erythema.  Eyes:     General: No scleral icterus.       Right eye: No discharge.        Left eye: No discharge.     Extraocular Movements: Extraocular movements intact.     Conjunctiva/sclera: Conjunctivae normal.     Pupils: Pupils are equal, round, and reactive to light.  Neck:     Vascular: No carotid bruit.  Cardiovascular:     Rate and Rhythm: Normal rate and regular rhythm.     Pulses: Normal pulses.     Heart sounds: Normal heart sounds. No murmur heard.   No friction rub. No gallop.  Pulmonary:     Effort: Pulmonary effort is normal. No respiratory distress.     Breath sounds: Normal breath sounds. No wheezing, rhonchi or rales.  Chest:     Chest wall: No tenderness.  Abdominal:     General: Bowel sounds are normal. There is no  distension.     Palpations: Abdomen is soft. There is no mass.     Tenderness: There is no abdominal tenderness. There is no right CVA tenderness, left CVA tenderness, guarding or rebound.  Musculoskeletal:        General: No swelling or tenderness. Normal range of motion.     Cervical back: Normal range of motion. No rigidity or tenderness.     Right lower leg: No edema.  Left lower leg: No edema.  Lymphadenopathy:     Cervical: No cervical adenopathy.  Skin:    General: Skin is warm and dry.     Coloration: Skin is not pale.     Findings: No bruising, erythema, lesion or rash.  Neurological:     Mental Status: He is alert and oriented to person, place, and time.     Cranial Nerves: No cranial nerve deficit.     Sensory: No sensory deficit.     Motor: No weakness.     Coordination: Coordination normal.     Gait: Gait normal.  Psychiatric:        Mood and Affect: Mood normal.        Speech: Speech normal.        Behavior: Behavior normal.        Thought Content: Thought content normal.        Judgment: Judgment normal.    Labs reviewed: Recent Labs    10/10/20 0926 06/25/21 1355  NA 143 142  K 4.4 4.4  CL 111* 109  CO2 23 25  GLUCOSE 141* 99  BUN 51* 47*  CREATININE 2.89* 2.95*  CALCIUM 9.9 9.5   Recent Labs    10/10/20 0926 06/25/21 1355  AST 15 17  ALT 11 14  BILITOT 0.4 0.3  PROT 6.0* 5.9*   Recent Labs    10/10/20 0926 06/25/21 1355 07/10/21 1357  WBC 6.3 6.5 8.2  NEUTROABS 3,137 3,322 4,100  HGB 12.2* 10.9* 10.4*  HCT 37.1* 33.0* 31.8*  MCV 98.7 100.3* 99.4  PLT 152 128* 127*   No results found for: TSH Lab Results  Component Value Date   HGBA1C 5.8 (H) 06/25/2021   Lab Results  Component Value Date   CHOL 164 10/10/2020   HDL 42 10/10/2020   LDLCALC 99 10/10/2020   TRIG 129 10/10/2020   CHOLHDL 3.9 10/10/2020    Significant Diagnostic Results in last 30 days:  No results found.  Assessment/Plan  1. Depression with  anxiety Worsening depression symptoms  Increase Paxil from 30 to 40 mg tablet daily. - PARoxetine (PAXIL) 40 MG tablet; Take 1 tablet (40 mg total) by mouth daily.  Dispense: 90 tablet; Refill: 0 - Notify provider if symptoms worsen or fail to improve  2. Seasonal allergic rhinitis, unspecified trigger Start on loratadine 10 mg tablet daily   Family/ staff Communication: Reviewed plan of care with patient verbalized understanding.  Labs/tests ordered: None   Next Appointment: As needed if symptoms worsen or fail to improve    Sandrea Hughs, NP

## 2021-08-06 ENCOUNTER — Inpatient Hospital Stay: Payer: PPO | Attending: Oncology | Admitting: Oncology

## 2021-08-06 ENCOUNTER — Other Ambulatory Visit: Payer: Self-pay

## 2021-08-06 ENCOUNTER — Inpatient Hospital Stay: Payer: PPO

## 2021-08-06 VITALS — BP 138/58 | HR 83 | Temp 97.8°F | Resp 20 | Ht 67.0 in | Wt 170.2 lb

## 2021-08-06 DIAGNOSIS — I129 Hypertensive chronic kidney disease with stage 1 through stage 4 chronic kidney disease, or unspecified chronic kidney disease: Secondary | ICD-10-CM | POA: Insufficient documentation

## 2021-08-06 DIAGNOSIS — E1122 Type 2 diabetes mellitus with diabetic chronic kidney disease: Secondary | ICD-10-CM | POA: Diagnosis not present

## 2021-08-06 DIAGNOSIS — D649 Anemia, unspecified: Secondary | ICD-10-CM | POA: Insufficient documentation

## 2021-08-06 DIAGNOSIS — Z87891 Personal history of nicotine dependence: Secondary | ICD-10-CM | POA: Insufficient documentation

## 2021-08-06 DIAGNOSIS — E785 Hyperlipidemia, unspecified: Secondary | ICD-10-CM | POA: Diagnosis not present

## 2021-08-06 DIAGNOSIS — D696 Thrombocytopenia, unspecified: Secondary | ICD-10-CM | POA: Insufficient documentation

## 2021-08-06 DIAGNOSIS — N189 Chronic kidney disease, unspecified: Secondary | ICD-10-CM | POA: Diagnosis not present

## 2021-08-06 DIAGNOSIS — F32A Depression, unspecified: Secondary | ICD-10-CM | POA: Insufficient documentation

## 2021-08-06 DIAGNOSIS — J449 Chronic obstructive pulmonary disease, unspecified: Secondary | ICD-10-CM | POA: Insufficient documentation

## 2021-08-06 DIAGNOSIS — J320 Chronic maxillary sinusitis: Secondary | ICD-10-CM | POA: Diagnosis not present

## 2021-08-06 DIAGNOSIS — G309 Alzheimer's disease, unspecified: Secondary | ICD-10-CM | POA: Diagnosis not present

## 2021-08-06 DIAGNOSIS — F0283 Dementia in other diseases classified elsewhere, unspecified severity, with mood disturbance: Secondary | ICD-10-CM | POA: Insufficient documentation

## 2021-08-06 LAB — CBC WITH DIFFERENTIAL (CANCER CENTER ONLY)
Abs Immature Granulocytes: 0.02 10*3/uL (ref 0.00–0.07)
Basophils Absolute: 0.1 10*3/uL (ref 0.0–0.1)
Basophils Relative: 1 %
Eosinophils Absolute: 0.5 10*3/uL (ref 0.0–0.5)
Eosinophils Relative: 6 %
HCT: 34 % — ABNORMAL LOW (ref 39.0–52.0)
Hemoglobin: 11 g/dL — ABNORMAL LOW (ref 13.0–17.0)
Immature Granulocytes: 0 %
Lymphocytes Relative: 34 %
Lymphs Abs: 2.6 10*3/uL (ref 0.7–4.0)
MCH: 32.7 pg (ref 26.0–34.0)
MCHC: 32.4 g/dL (ref 30.0–36.0)
MCV: 101.2 fL — ABNORMAL HIGH (ref 80.0–100.0)
Monocytes Absolute: 0.7 10*3/uL (ref 0.1–1.0)
Monocytes Relative: 9 %
Neutro Abs: 3.9 10*3/uL (ref 1.7–7.7)
Neutrophils Relative %: 50 %
Platelet Count: 145 10*3/uL — ABNORMAL LOW (ref 150–400)
RBC: 3.36 MIL/uL — ABNORMAL LOW (ref 4.22–5.81)
RDW: 14.3 % (ref 11.5–15.5)
WBC Count: 7.7 10*3/uL (ref 4.0–10.5)
nRBC: 0 % (ref 0.0–0.2)

## 2021-08-06 LAB — RETIC PANEL
Immature Retic Fract: 9.4 % (ref 2.3–15.9)
RBC.: 3.32 MIL/uL — ABNORMAL LOW (ref 4.22–5.81)
Retic Count, Absolute: 58.8 10*3/uL (ref 19.0–186.0)
Retic Ct Pct: 1.8 % (ref 0.4–3.1)
Reticulocyte Hemoglobin: 35.8 pg (ref >27.9)

## 2021-08-06 LAB — SAVE SMEAR(SSMR), FOR PROVIDER SLIDE REVIEW

## 2021-08-06 NOTE — Progress Notes (Signed)
[facility] New Patient Consult   Requesting MD: Sandrea Hughs, [place],  [place] [id number]   Ian [name] 86 y.o.  [birth year]    Reason for Consult: Anemia, thrombocytopenia   HPI: Ian [name] is followed at [facility] care for primary care.  He had a CBC on 06/25/2021.  The hemoglobin returned at 10.9, MCV 100.3, hematocrit 33%, platelets 128,000, white count 6.5, ANC 3.3, and absolute lymphocyte count 2.1. A follow-up CBC on 07/10/2021 found the hemoglobin to 10.4 and platelets 127,000.  Ian [name] believes he was anemic many years ago.  No bleeding.  He feels well.  Past Medical History:  Diagnosis Date   Allergy    Alzheimer's disease ([place])    Anemia, unspecified    Anxiety    Chronic maxillary sinusitis    COPD (chronic obstructive pulmonary disease) ([place])    Depression    Diabetes mellitus    Edema    History of hypercalcemia    Hyperlipidemia    Hypertension    Hypertrophy of prostate without urinary obstruction and other lower urinary tract symptoms (LUTS)    Hypertrophy of prostate without urinary obstruction and other lower urinary tract symptoms (LUTS)    Kidney disease    mild   Unspecified disorder of kidney and ureter    Unspecified sleep apnea    Ventral hernia, unspecified, without mention of obstruction or gangrene     Past Surgical History:  Procedure Laterality Date   EXPLORATORY LAPAROTOMY  2002   HERNIA REPAIR      Medications: Reviewed  Allergies:  Allergies  Allergen Reactions   Ace Inhibitors Cough    Family history: A sister had colon cancer.  Social History:   He lives alone in [place].  His wife died approximately 4 years ago.  He is retired from a Programmer, applications.  He quit smoking cigarettes 40 years ago after smoking 2 packs/day, no alcohol use.  He has been a blood donor.  No blood transfusion.  No risk factor for HIV or hepatitis.  ROS:   Positives include: None  A complete ROS was otherwise  negative.  Physical Exam:  Blood pressure (!) 138/58, pulse 83, temperature 97.8 F (36.6 C), temperature source Oral, resp. rate 20, height 5' 7"  (1.702 m), weight 170 lb 3.2 oz (77.2 kg), SpO2 95 %.  HEENT: Oropharynx without visible mass, neck without mass Lungs: Distant breath sounds, no respiratory distress Cardiac: Regular rate and rhythm Abdomen: No hepatosplenomegaly, no mass, nontender  Vascular: No leg edema Lymph nodes: No cervical, supraclavicular, axillary, or inguinal nodes Neurologic: Alert, oriented to year and day, not month.  Follows commands.  The motor exam appears intact in the upper and lower extremities bilaterally Skin: No rash Musculoskeletal: No spine tenderness   LAB:  CBC  Lab Results  Component Value Date   WBC 7.7 08/06/2021   HGB 11.0 (L) 08/06/2021   HCT 34.0 (L) 08/06/2021   MCV 101.2 (H) 08/06/2021   PLT 145 (L) 08/06/2021   NEUTROABS 3.9 08/06/2021  Blood smear: The majority of  the white cells are mature neutrophils and lymphocytes.  No blasts or other young forms are seen.  The plates appear normal in number.  No large platelet clumps.  Few ovalocytes and teardrops.  The polychromasia is not increased.  No nucleated red cells.      CMP  Lab Results  Component Value Date   NA 142 06/25/2021   K 4.4  06/25/2021   CL 109 06/25/2021   CO2 25 06/25/2021   GLUCOSE 99 06/25/2021   BUN 47 (H) 06/25/2021   CREATININE 2.95 (H) 06/25/2021   CALCIUM 9.5 06/25/2021   PROT 5.9 (L) 06/25/2021   ALBUMIN 3.6 03/10/2020   AST 17 06/25/2021   ALT 14 06/25/2021   ALKPHOS 37 (L) 01/01/2017   BILITOT 0.3 06/25/2021   GFRNONAA 19 (L) 10/10/2020   GFRAA 22 (L) 10/10/2020        Assessment/Plan:   Anemia-macrocytic Chronic mild anemia, elevated MCV beginning 2022 Mild thrombocytopenia Dementia COPD Chronic renal failure Hypertension Hyperlipidemia History of hypercalcemia Depression   Disposition:   Ian [name] is referred for  evaluation of anemia and mild thrombocytopenia.  Appears to have chronic mild anemia.  The anemia is most likely related to chronic renal failure or early myelodysplasia.  He had  normal vitamin B12 and ferritin levels in December 2022.  We will check the TSH.  The differential diagnosis includes multiple myeloma.  We will check a myeloma panel and serum light chains today.  I recommend proceeding with a diagnostic bone marrow biopsy if he develops progressive anemia or thrombocytopenia.  He will return for an office visit and CBC in 4 months.  Betsy Coder, MD  08/06/2021, 2:39 PM

## 2021-08-07 ENCOUNTER — Ambulatory Visit (INDEPENDENT_AMBULATORY_CARE_PROVIDER_SITE_OTHER): Payer: PPO | Admitting: Pulmonary Disease

## 2021-08-07 ENCOUNTER — Encounter: Payer: Self-pay | Admitting: Pulmonary Disease

## 2021-08-07 VITALS — BP 144/60 | HR 53 | Temp 97.6°F | Ht 67.0 in | Wt 169.8 lb

## 2021-08-07 DIAGNOSIS — J301 Allergic rhinitis due to pollen: Secondary | ICD-10-CM | POA: Diagnosis not present

## 2021-08-07 DIAGNOSIS — J449 Chronic obstructive pulmonary disease, unspecified: Secondary | ICD-10-CM

## 2021-08-07 LAB — KAPPA/LAMBDA LIGHT CHAINS
Kappa free light chain: 91.6 mg/L — ABNORMAL HIGH (ref 3.3–19.4)
Kappa, lambda light chain ratio: 2.27 — ABNORMAL HIGH (ref 0.26–1.65)
Lambda free light chains: 40.4 mg/L — ABNORMAL HIGH (ref 5.7–26.3)

## 2021-08-07 NOTE — Progress Notes (Signed)
[facility]  Chief Complaint  Patient presents with   Follow-up    copd    Constitutional:  BP (!) 144/60 (BP Location: Left Arm, Cuff Size: Normal)    Pulse (!) 53    Temp 97.6 F (36.4 C) (Oral)    Ht 5' 7"  (1.702 m)    Wt 169 lb 12.8 oz (77 kg)    SpO2 93%    BMI 26.59 kg/m   Past Medical History:  Alzheimer's disease, Anemia, Anxiety, Depression, DM, HLD, HTN, BPH  Past Surgical History:  He  has a past surgical history that includes Hernia repair and Exploratory laparotomy (2002).  Brief Summary:  Ian [name] is a 86 y.o. male former smoker with chronic cough from COPD/asthma and rhinitis.      Subjective:   Breathing okay.  Not having cough, wheeze, or sputum.  Using albuterol few times per week.  Has persistent sinus congestion and runny nose  has been using flonase.  Sleeping okay.  Not having breathing trouble at present.  Physical Exam:   Appearance - well kempt   ENMT - no sinus tenderness, no oral exudate, no LAN, Mallampati 4 airway, no stridor  Respiratory - equal breath sounds bilaterally, no wheezing or rales  CV - s1s2 regular rate and rhythm, no murmurs  Ext - no clubbing, no edema  Skin - no rashes  Psych - normal mood and affect    Pulmonary testing:  PFT 11/29/11 >> FEV1 1.71 (67%), FEV1% 64, TLC 5.29 (92%), DLCO 84%, +BD PFT 01/01/18 >> FEV1 1.34 (54%), FEV1% 63, DLCO 60%  Chest Imaging:  CT chest 12/29/12 >> lingular PNA CT chest 12/06/15 >> mild centrilobular emphysema, nodularity in Lt upper and lower lobes CT chest 02/05/16 >> mild paraseptal emphysema, scar in lingula, much improved nodules in LUL  Cardiac Tests:  Echo 01/03/13 >> EF 60 to 65%, grade 1 DD  Social History:  He  reports that he quit smoking about 33 years ago. His smoking use included cigarettes. He has a 45.00 pack-year smoking history. He has never used smokeless tobacco. He reports that he does not drink alcohol and does  not use drugs.  Family History:  His family history includes Heart disease in his father; Hypertension in his brother and sister; Pancreatic cancer in his sister.     Assessment/Plan:   COPD with asthma and emphysema. - singulair 10 mg qhs - prn albuterol  Allergic rhinitis. - continue flonase, signulair - he can try adding OTC astepro if symptoms progress  Time Spent Involved in Patient Care on Day of Examination:  27 minutes  Follow up:   Patient Instructions  Can try using Astepro one spray in each nostril twice per day as needed for sinus congestion and runny nose  Follow up in 1 year  Medication List:   Allergies as of 08/07/2021       Reactions   Ace Inhibitors Cough        Medication List        Accurate as of August 07, 2021  2:55 PM. If you have any questions, ask your nurse or doctor.          STOP taking these medications    budesonide-formoterol 160-4.5 MCG/ACT inhaler Commonly known as: SYMBICORT Stopped by: Chesley Mires, MD       TAKE these medications    acetaminophen 500 MG tablet Commonly known as: TYLENOL Take 1,000 mg by mouth every 8 (  eight) hours as needed.   albuterol 108 (90 Base) MCG/ACT inhaler Commonly known as: VENTOLIN HFA Inhale 2 puffs into the lungs every 6 (six) hours as needed for wheezing or shortness of breath.   amLODipine 10 MG tablet Commonly known as: NORVASC TAKE 1 TABLET BY MOUTH EVERY DAY   Aspirin Low Dose 81 MG EC tablet Generic drug: aspirin TAKE 1 TABLET BY MOUTH DAILY SWALLOW WHOLE   atorvastatin 80 MG tablet Commonly known as: LIPITOR TAKE 1 TABLET BY MOUTH EVERY DAY   Besivance 0.6 % Susp Generic drug: Besifloxacin HCl INSTILL ONE DROP INTO RIGHT EYE 4 TIMES A DAY FOR 2 DAYS AFTER EACH MONTHLY EYE INJECTION   bisacodyl 5 MG EC tablet Generic drug: bisacodyl Take 1 tablet (5 mg total) by mouth daily as needed for moderate constipation.   blood glucose meter kit and supplies Kit Inject  1 each into the skin 2 (two) times daily. Dispense based on patient and insurance preference. Use up to four times daily as directed. (FOR ICD-9 250.00, 250.01).   diclofenac sodium 1 % Gel Commonly known as: VOLTAREN Apply 2 g topically 4 (four) times daily as needed (shoulder pain).   fluticasone 50 MCG/ACT nasal spray Commonly known as: FLONASE Place 1 spray into both nostrils daily.   glucose blood test strip Commonly known as: ONE TOUCH ULTRA TEST Use to test blood sugar twice daily.  DX: E11.9   guaiFENesin 600 MG 12 hr tablet Commonly known as: Mucinex Take 2 tablets (1,200 mg total) by mouth 2 (two) times daily as needed for cough or to loosen phlegm.   hydrALAZINE 25 MG tablet Commonly known as: APRESOLINE TAKE 1 TABLET BY MOUTH TWICE A DAY   ipratropium-albuterol 0.5-2.5 (3) MG/3ML Soln Commonly known as: DUONEB Take 3 mLs by nebulization every 6 (six) hours as needed (wheezing).   loratadine 10 MG tablet Commonly known as: CLARITIN Take 1 tablet (10 mg total) by mouth daily.   metoprolol tartrate 50 MG tablet Commonly known as: LOPRESSOR Take 1 tablet (50 mg total) by mouth 2 (two) times daily.   montelukast 10 MG tablet Commonly known as: SINGULAIR TAKE 1 TABLET BY MOUTH EVERYDAY AT BEDTIME   ONE TOUCH DELICA LANCING DEV Misc Use to test blood sugar once daily dx: E119   onetouch ultrasoft lancets Use to test blood sugar up to three times daily. Dx E11.9   PARoxetine 40 MG tablet Commonly known as: PAXIL Take 1 tablet (40 mg total) by mouth daily.   sodium chloride 0.65 % Soln nasal spray Commonly known as: OCEAN Place 1 spray into both nostrils as needed for congestion.   traZODone 50 MG tablet Commonly known as: DESYREL TAKE 1 TABLET BY MOUTH EVERYDAY AT BEDTIME   trimethoprim-polymyxin b ophthalmic solution Commonly known as: POLYTRIM INSTILL ONE DROP INTO RIGHT EYE 4 TIMES A DAY FOR 2 DAYS AFTER EACH MONTHLY EYE INJECTION   Vitamin D  (Ergocalciferol) 1.25 MG (50000 UNIT) Caps capsule Commonly known as: DRISDOL TAKE 1 CAPSULE BY MOUTH ONE TIME PER WEEK        Signature:  Chesley Mires, MD [place] Pager - [phone] 08/07/2021, 2:55 PM

## 2021-08-07 NOTE — Patient Instructions (Signed)
Can try using Astepro one spray in each nostril twice per day as needed for sinus congestion and runny nose  Follow up in 1 year

## 2021-08-08 LAB — MULTIPLE MYELOMA PANEL, SERUM
Albumin SerPl Elph-Mcnc: 3.1 g/dL (ref 2.9–4.4)
Albumin/Glob SerPl: 1.1 (ref 0.7–1.7)
Alpha 1: 0.3 g/dL (ref 0.0–0.4)
Alpha2 Glob SerPl Elph-Mcnc: 0.8 g/dL (ref 0.4–1.0)
B-Globulin SerPl Elph-Mcnc: 0.9 g/dL (ref 0.7–1.3)
Gamma Glob SerPl Elph-Mcnc: 0.9 g/dL (ref 0.4–1.8)
Globulin, Total: 2.9 g/dL (ref 2.2–3.9)
IgA: 239 mg/dL (ref 61–437)
IgG (Immunoglobin G), Serum: 911 mg/dL (ref 603–1613)
IgM (Immunoglobulin M), Srm: 49 mg/dL (ref 15–143)
Total Protein ELP: 6 g/dL (ref 6.0–8.5)

## 2021-08-09 ENCOUNTER — Telehealth: Payer: Self-pay

## 2021-08-09 ENCOUNTER — Other Ambulatory Visit: Payer: Self-pay

## 2021-08-09 DIAGNOSIS — D649 Anemia, unspecified: Secondary | ICD-10-CM

## 2021-08-09 NOTE — Telephone Encounter (Signed)
Pt verbalized understanding. Ferritin ordered.

## 2021-08-09 NOTE — Telephone Encounter (Signed)
-----   Message from Ladell Pier, MD sent at 08/08/2021  5:46 PM EST ----- Please call patient, myeloma panel is negative, follow-up as scheduled, add ferritin to next lab visit

## 2021-08-13 ENCOUNTER — Other Ambulatory Visit: Payer: PPO

## 2021-08-13 ENCOUNTER — Encounter (INDEPENDENT_AMBULATORY_CARE_PROVIDER_SITE_OTHER): Payer: PPO | Admitting: Ophthalmology

## 2021-08-13 ENCOUNTER — Other Ambulatory Visit: Payer: Self-pay

## 2021-08-13 DIAGNOSIS — H35033 Hypertensive retinopathy, bilateral: Secondary | ICD-10-CM | POA: Diagnosis not present

## 2021-08-13 DIAGNOSIS — H43813 Vitreous degeneration, bilateral: Secondary | ICD-10-CM

## 2021-08-13 DIAGNOSIS — H34832 Tributary (branch) retinal vein occlusion, left eye, with macular edema: Secondary | ICD-10-CM

## 2021-08-13 DIAGNOSIS — I10 Essential (primary) hypertension: Secondary | ICD-10-CM

## 2021-08-13 DIAGNOSIS — H353211 Exudative age-related macular degeneration, right eye, with active choroidal neovascularization: Secondary | ICD-10-CM

## 2021-08-13 DIAGNOSIS — H2512 Age-related nuclear cataract, left eye: Secondary | ICD-10-CM

## 2021-08-15 ENCOUNTER — Encounter: Payer: Self-pay | Admitting: Family

## 2021-08-15 ENCOUNTER — Other Ambulatory Visit: Payer: Self-pay | Admitting: Family

## 2021-08-15 ENCOUNTER — Other Ambulatory Visit: Payer: Self-pay

## 2021-08-15 ENCOUNTER — Ambulatory Visit (INDEPENDENT_AMBULATORY_CARE_PROVIDER_SITE_OTHER): Payer: PPO | Admitting: Family

## 2021-08-15 ENCOUNTER — Ambulatory Visit
Admission: RE | Admit: 2021-08-15 | Discharge: 2021-08-15 | Disposition: A | Discharge status: Home / Self Care | Payer: PPO | Source: Ambulatory Visit | Attending: Family | Admitting: Family

## 2021-08-15 VITALS — BP 136/70 | HR 60 | Temp 97.8°F | Resp 16 | Ht 67.0 in | Wt 166.0 lb

## 2021-08-15 DIAGNOSIS — M25551 Pain in right hip: Secondary | ICD-10-CM

## 2021-08-15 NOTE — Patient Instructions (Addendum)
-   Continue on  Extra strength tylenol 1000 mg tablet  - Please get right hip X-ray at [place] at [facility] then will call you with results.

## 2021-08-15 NOTE — Progress Notes (Signed)
Provider: Bo Rogue FNP-C  Lauree Chandler, NP  Patient Care Team: Lauree Chandler, NP as PCP - General (Geriatric Medicine) Zebedee Iba., MD as Referring Physician (Ophthalmology) Chesley Mires, MD as Consulting Physician (Pulmonary Disease) Lavonna Monarch, MD as Consulting Physician (Dermatology) Ladell Pier, MD as Consulting Physician (Oncology)  Extended Emergency Contact Information Primary Emergency Contact: Heumann,Mike Mobile Phone: [phone] Relation: Son Secondary Emergency Contact: Auletta,Matt  [place] Mobile Phone: [phone] Relation: Son  Code Status:  DNR Goals of care: Advanced Directive information Advanced Directives 08/15/2021  Does Patient Have a Medical Advance Directive? Yes  Type of Paramedic of [place];Living will  Does patient want to make changes to medical advance directive? No - Patient declined  Copy of [place] in Chart? Yes - validated most recent copy scanned in chart (See row information)  Would patient like information on creating a medical advance directive? -  Pre-existing out of facility DNR order (yellow form or pink MOST form) -     Chief Complaint  Patient presents with   Acute Visit    Patient complains of Right hip pain.    HPI:  Pt is a 86 y.o. male seen today for an acute visit for evaluation of right hip pain x 1 week.states got out of the bed a week when he stepped down felt like something twisted on the right hip.pain is described as sharp.Pain worst when moving and walking.denies and numbness,tingling or weakness.    Past Medical History:  Diagnosis Date   Allergy    Alzheimer's disease ([place])    Anemia, unspecified    Anxiety    Chronic maxillary sinusitis    COPD (chronic obstructive pulmonary disease) ([facility])    Depression    Diabetes mellitus    Edema    Hypercalcemia    Hyperlipidemia    Hypertension    Hypertrophy of prostate  without urinary obstruction and other lower urinary tract symptoms (LUTS)    Hypertrophy of prostate without urinary obstruction and other lower urinary tract symptoms (LUTS)    Kidney disease    mild   Unspecified disorder of kidney and ureter    Unspecified sleep apnea    Ventral hernia, unspecified, without mention of obstruction or gangrene    Past Surgical History:  Procedure Laterality Date   EXPLORATORY LAPAROTOMY  2002   HERNIA REPAIR      Allergies  Allergen Reactions   Ace Inhibitors Cough    Outpatient Encounter Medications as of 08/15/2021  Medication Sig   acetaminophen (TYLENOL) 500 MG tablet Take 1,000 mg by mouth every 8 (eight) hours as needed.   albuterol (VENTOLIN HFA) 108 (90 Base) MCG/ACT inhaler Inhale 2 puffs into the lungs every 6 (six) hours as needed for wheezing or shortness of breath.   amLODipine (NORVASC) 10 MG tablet TAKE 1 TABLET BY MOUTH EVERY DAY   ASPIRIN LOW DOSE 81 MG EC tablet TAKE 1 TABLET BY MOUTH DAILY SWALLOW WHOLE   atorvastatin (LIPITOR) 80 MG tablet TAKE 1 TABLET BY MOUTH EVERY DAY   BESIVANCE 0.6 % SUSP INSTILL ONE DROP INTO RIGHT EYE 4 TIMES A DAY FOR 2 DAYS AFTER EACH MONTHLY EYE INJECTION   bisacodyl 5 MG EC tablet Take 1 tablet (5 mg total) by mouth daily as needed for moderate constipation.   blood glucose meter kit and supplies KIT Inject 1 each into the skin 2 (two) times daily. Dispense based on patient and  insurance preference. Use up to four times daily as directed. (FOR ICD-9 250.00, 250.01).   diclofenac sodium (VOLTAREN) 1 % GEL Apply 2 g topically 4 (four) times daily as needed (shoulder pain).   fluticasone (FLONASE) 50 MCG/ACT nasal spray Place 1 spray into both nostrils daily.   glucose blood (ONE TOUCH ULTRA TEST) test strip Use to test blood sugar twice daily.  DX: E11.9   guaiFENesin (MUCINEX) 600 MG 12 hr tablet Take 2 tablets (1,200 mg total) by mouth 2 (two) times daily as needed for cough or to loosen phlegm.    hydrALAZINE (APRESOLINE) 25 MG tablet TAKE 1 TABLET BY MOUTH TWICE A DAY   ipratropium-albuterol (DUONEB) 0.5-2.5 (3) MG/3ML SOLN Take 3 mLs by nebulization every 6 (six) hours as needed (wheezing).   Lancet Devices (ONE TOUCH DELICA LANCING DEV) MISC Use to test blood sugar once daily dx: E119   Lancets (ONETOUCH ULTRASOFT) lancets Use to test blood sugar up to three times daily. Dx E11.9   loratadine (CLARITIN) 10 MG tablet Take 1 tablet (10 mg total) by mouth daily.   metoprolol tartrate (LOPRESSOR) 50 MG tablet Take 1 tablet (50 mg total) by mouth 2 (two) times daily.   montelukast (SINGULAIR) 10 MG tablet TAKE 1 TABLET BY MOUTH EVERYDAY AT BEDTIME   PARoxetine (PAXIL) 40 MG tablet Take 1 tablet (40 mg total) by mouth daily.   sodium chloride (OCEAN) 0.65 % SOLN nasal spray Place 1 spray into both nostrils as needed for congestion.   traZODone (DESYREL) 50 MG tablet TAKE 1 TABLET BY MOUTH EVERYDAY AT BEDTIME   trimethoprim-polymyxin b (POLYTRIM) ophthalmic solution INSTILL ONE DROP INTO RIGHT EYE 4 TIMES A DAY FOR 2 DAYS AFTER EACH MONTHLY EYE INJECTION   Vitamin D, Ergocalciferol, (DRISDOL) 1.25 MG (50000 UNIT) CAPS capsule TAKE 1 CAPSULE BY MOUTH ONE TIME PER WEEK   No facility-administered encounter medications on file as of 08/15/2021.    Review of Systems  Constitutional:  Negative for chills, fatigue and fever.  Musculoskeletal:  Positive for arthralgias. Negative for back pain, gait problem, joint swelling, neck pain and neck stiffness.       Right hip  Neurological:  Negative for weakness and numbness.   Immunization History  Administered Date(s) Administered   Fluad Quad(high Dose 65+) 05/27/2019, 03/30/2020, 06/25/2021   Influenza Split 04/08/2011   Influenza Whole 04/07/2012   Influenza, High Dose Seasonal PF 04/07/2017, 05/25/2018, 07/22/2019   Influenza,inj,Quad PF,6+ Mos 04/23/2013, 03/28/2014, 02/23/2016   Influenza-Unspecified 04/08/2015, 07/09/2015, 04/08/2017    Moderna Sars-Covid-2 Vaccination 10/07/2019, 11/07/2019, 05/10/2020   Pneumococcal Conjugate-13 06/06/2014   Pneumococcal-Unspecified 07/09/2007   Td 08/09/1997   Tdap 08/12/2011   Pertinent  Health Maintenance Due  Topic Date Due   OPHTHALMOLOGY EXAM  10/04/2021   HEMOGLOBIN A1C  12/24/2021   FOOT EXAM  06/25/2022   INFLUENZA VACCINE  Completed   Fall Risk 01/26/2021 02/12/2021 06/25/2021 07/20/2021 08/15/2021  Falls in the past year? 0 0 0 0 0  Was there an injury with Fall? 0 0 0 0 0  Fall Risk Category Calculator 0 0 0 0 0  Fall Risk Category Low Low Low Low Low  Patient Fall Risk Level Low fall risk Low fall risk Low fall risk Low fall risk Low fall risk  Patient at Risk for Falls Due to No Fall Risks No Fall Risks No Fall Risks No Fall Risks No Fall Risks  Fall risk Follow up Falls evaluation completed Falls evaluation completed Falls evaluation completed  Falls evaluation completed Falls evaluation completed   Functional Status Survey:    Vitals:   08/15/21 1408  BP: 136/70  Pulse: 60  Resp: 16  Temp: 97.8 F (36.6 C)  SpO2: 91%  Weight: 166 lb (75.3 kg)  Height: 5' 7"  (1.702 m)   Body mass index is 26 kg/m. Physical Exam Vitals reviewed.  Constitutional:      General: He is not in acute distress.    Appearance: Normal appearance. He is normal weight. He is not ill-appearing or diaphoretic.  HENT:     Head: Normocephalic.     Comments: HOH Eyes:     General: No scleral icterus.       Right eye: No discharge.        Left eye: No discharge.     Conjunctiva/sclera: Conjunctivae normal.     Pupils: Pupils are equal, round, and reactive to light.  Cardiovascular:     Rate and Rhythm: Normal rate and regular rhythm.     Pulses: Normal pulses.     Heart sounds: Normal heart sounds. No murmur heard.   No friction rub. No gallop.  Pulmonary:     Effort: Pulmonary effort is normal. No respiratory distress.     Breath sounds: Normal breath sounds. No wheezing, rhonchi  or rales.  Chest:     Chest wall: No tenderness.  Abdominal:     General: Bowel sounds are normal. There is no distension.     Palpations: Abdomen is soft. There is no mass.     Tenderness: There is no abdominal tenderness. There is no right CVA tenderness, left CVA tenderness, guarding or rebound.  Musculoskeletal:        General: No swelling.     Right hip: Tenderness present. No crepitus. Normal range of motion. Normal strength.     Left hip: Normal.     Right lower leg: No edema.     Left lower leg: No edema.  Skin:    General: Skin is warm and dry.     Coloration: Skin is not pale.     Findings: No erythema.  Neurological:     Mental Status: He is alert and oriented to person, place, and time.     Motor: No weakness.     Gait: Gait normal.  Psychiatric:        Mood and Affect: Mood normal.        Speech: Speech normal.        Behavior: Behavior normal.        Thought Content: Thought content normal.    Labs reviewed: Recent Labs    10/10/20 0926 06/25/21 1355  NA 143 142  K 4.4 4.4  CL 111* 109  CO2 23 25  GLUCOSE 141* 99  BUN 51* 47*  CREATININE 2.89* 2.95*  CALCIUM 9.9 9.5   Recent Labs    10/10/20 0926 06/25/21 1355  AST 15 17  ALT 11 14  BILITOT 0.4 0.3  PROT 6.0* 5.9*   Recent Labs    06/25/21 1355 07/10/21 1357 08/06/21 1345  WBC 6.5 8.2 7.7  NEUTROABS 3,322 4,100 3.9  HGB 10.9* 10.4* 11.0*  HCT 33.0* 31.8* 34.0*  MCV 100.3* 99.4 101.2*  PLT 128* 127* 145*   No results found for: TSH Lab Results  Component Value Date   HGBA1C 5.8 (H) 06/25/2021   Lab Results  Component Value Date   CHOL 164 10/10/2020   HDL 42 10/10/2020   LDLCALC 99 10/10/2020  TRIG 129 10/10/2020   CHOLHDL 3.9 10/10/2020    Significant Diagnostic Results in last 30 days:  No results found.  Assessment/Plan  Acute right hip pain Right hip tender to palpation.pain with ROM  - continue on Extra strength tylenol 1000 mg tablet  - Please get right hip X-ray  at [place] at [facility] then will call you with results. - DG Hip Unilat W OR W/O Pelvis 1V Right; Future - fall and safety precaution advised.encouraged to use a cane for stability. . Family/ staff Communication: Reviewed plan of care with patient verbalized understanding  Labs/tests ordered:  - DG Hip Unilat W OR W/O Pelvis 1V Right; Future  Next Appointment: As needed if symptoms worsen or fail to improve    Sandrea Hughs, NP

## 2021-08-17 ENCOUNTER — Ambulatory Visit: Payer: PPO | Admitting: Nurse Practitioner

## 2021-08-25 ENCOUNTER — Other Ambulatory Visit: Payer: Self-pay | Admitting: Nurse Practitioner

## 2021-09-03 DIAGNOSIS — N184 Chronic kidney disease, stage 4 (severe): Secondary | ICD-10-CM | POA: Diagnosis not present

## 2021-09-10 ENCOUNTER — Encounter: Payer: PPO | Admitting: Nurse Practitioner

## 2021-09-11 DIAGNOSIS — N184 Chronic kidney disease, stage 4 (severe): Secondary | ICD-10-CM | POA: Diagnosis not present

## 2021-09-11 DIAGNOSIS — N2581 Secondary hyperparathyroidism of renal origin: Secondary | ICD-10-CM | POA: Diagnosis not present

## 2021-09-11 DIAGNOSIS — I129 Hypertensive chronic kidney disease with stage 1 through stage 4 chronic kidney disease, or unspecified chronic kidney disease: Secondary | ICD-10-CM | POA: Diagnosis not present

## 2021-09-12 ENCOUNTER — Encounter: Payer: Self-pay | Admitting: Nurse Practitioner

## 2021-09-12 ENCOUNTER — Ambulatory Visit (INDEPENDENT_AMBULATORY_CARE_PROVIDER_SITE_OTHER): Payer: PPO | Admitting: Nurse Practitioner

## 2021-09-12 ENCOUNTER — Other Ambulatory Visit: Payer: Self-pay

## 2021-09-12 ENCOUNTER — Other Ambulatory Visit: Payer: PPO

## 2021-09-12 VITALS — BP 120/60 | HR 50 | Temp 97.1°F | Ht 67.0 in | Wt 168.0 lb

## 2021-09-12 DIAGNOSIS — E559 Vitamin D deficiency, unspecified: Secondary | ICD-10-CM | POA: Diagnosis not present

## 2021-09-12 DIAGNOSIS — H6121 Impacted cerumen, right ear: Secondary | ICD-10-CM

## 2021-09-12 DIAGNOSIS — F3342 Major depressive disorder, recurrent, in full remission: Secondary | ICD-10-CM | POA: Diagnosis not present

## 2021-09-12 DIAGNOSIS — D696 Thrombocytopenia, unspecified: Secondary | ICD-10-CM

## 2021-09-12 DIAGNOSIS — E1169 Type 2 diabetes mellitus with other specified complication: Secondary | ICD-10-CM | POA: Diagnosis not present

## 2021-09-12 DIAGNOSIS — E785 Hyperlipidemia, unspecified: Secondary | ICD-10-CM

## 2021-09-12 DIAGNOSIS — I10 Essential (primary) hypertension: Secondary | ICD-10-CM

## 2021-09-12 DIAGNOSIS — E1122 Type 2 diabetes mellitus with diabetic chronic kidney disease: Secondary | ICD-10-CM

## 2021-09-12 DIAGNOSIS — N184 Chronic kidney disease, stage 4 (severe): Secondary | ICD-10-CM | POA: Diagnosis not present

## 2021-09-12 DIAGNOSIS — J449 Chronic obstructive pulmonary disease, unspecified: Secondary | ICD-10-CM | POA: Diagnosis not present

## 2021-09-12 NOTE — Patient Instructions (Signed)
To schedule fasting lab appt AFTER 3/20 ? ?

## 2021-09-12 NOTE — Progress Notes (Signed)
Careteam: Patient Care Team: Lauree Chandler, NP as PCP - General (Geriatric Medicine) Zebedee Iba., MD as Referring Physician (Ophthalmology) Chesley Mires, MD as Consulting Physician (Pulmonary Disease) Lavonna Monarch, MD as Consulting Physician (Dermatology) Ladell Pier, MD as Consulting Physician (Oncology)  PLACE OF SERVICE:  [place] Directive information Does Patient Have a Medical Advance Directive?: Yes, Type of Advance Directive: Living will, Does patient want to make changes to medical advance directive?: No - Patient declined  Allergies  Allergen Reactions   Ace Inhibitors Cough    Chief Complaint  Patient presents with   Medical Management of Chronic Issues    6 month follow up   Health Maintenance    Shingrix vaccine,pneumonia vaccine, 2nd COVID booster, Tetanus/tdap     HPI: Patient is a 86 y.o. male follow up.   Pt reports he feels like wax is in his ears.   DM- up to date with eye exam, controlled at home.  glucose this morning was 125. No longer on medication.   Depression: recently started paxil for for mood, reports this is helping. No side effects noted.   HTN- On Metoprolol 50 mg BID and norvasc 10 mg, controlled at home, last BP this AM home was 599 Systolic. And usually 119 SBP in the afternoon.   HLD- Continues on Lipitor 80 mg, no issues, continue on dietary modification. Mostly consumes soup, eggs, grits, no bread.   Vit D def- on supplement weekly.   Review of Systems:  Review of Systems  Constitutional:  Negative for chills, fever and weight loss.  HENT:  Positive for hearing loss. Negative for tinnitus.   Respiratory:  Negative for cough, sputum production and shortness of breath.   Cardiovascular:  Negative for chest pain, palpitations and leg swelling.  Gastrointestinal:  Negative for abdominal pain, constipation, diarrhea, heartburn, nausea and vomiting.  Genitourinary:  Negative for dysuria, frequency and  urgency.  Musculoskeletal:  Negative for back pain, falls, joint pain and myalgias.  Skin: Negative.   Neurological:  Negative for dizziness and headaches.  Psychiatric/Behavioral:  Negative for depression and memory loss. The patient is not nervous/anxious and does not have insomnia.        Depression controlled   Past Medical History:  Diagnosis Date   Allergy    Alzheimer's disease ([place])    Anemia, unspecified    Anxiety    Chronic maxillary sinusitis    COPD (chronic obstructive pulmonary disease) ([place])    Depression    Diabetes mellitus    Edema    Hypercalcemia    Hyperlipidemia    Hypertension    Hypertrophy of prostate without urinary obstruction and other lower urinary tract symptoms (LUTS)    Hypertrophy of prostate without urinary obstruction and other lower urinary tract symptoms (LUTS)    Kidney disease    mild   Unspecified disorder of kidney and ureter    Unspecified sleep apnea    Ventral hernia, unspecified, without mention of obstruction or gangrene    Past Surgical History:  Procedure Laterality Date   EXPLORATORY LAPAROTOMY  2002   HERNIA REPAIR     Social History:   reports that he quit smoking about 33 years ago. His smoking use included cigarettes. He has a 45.00 pack-year smoking history. He has never used smokeless tobacco. He reports that he does not drink alcohol and does not use drugs.  Family History  Problem Relation Age of Onset   Pancreatic cancer Sister  Heart disease Father    Hypertension Brother    Hypertension Sister     Medications: Patient's Medications  New Prescriptions   No medications on file  Previous Medications   ACETAMINOPHEN (TYLENOL) 500 MG TABLET    Take 1,000 mg by mouth every 8 (eight) hours as needed.   ALBUTEROL (VENTOLIN HFA) 108 (90 BASE) MCG/ACT INHALER    Inhale 2 puffs into the lungs every 6 (six) hours as needed for wheezing or shortness of breath.   AMLODIPINE (NORVASC) 10 MG TABLET    TAKE 1 TABLET BY  MOUTH EVERY DAY   ASPIRIN LOW DOSE 81 MG EC TABLET    TAKE 1 TABLET BY MOUTH DAILY SWALLOW WHOLE   ATORVASTATIN (LIPITOR) 80 MG TABLET    TAKE 1 TABLET BY MOUTH EVERY DAY   BESIVANCE 0.6 % SUSP    INSTILL ONE DROP INTO RIGHT EYE 4 TIMES A DAY FOR 2 DAYS AFTER EACH MONTHLY EYE INJECTION   BISACODYL 5 MG EC TABLET    Take 1 tablet (5 mg total) by mouth daily as needed for moderate constipation.   BLOOD GLUCOSE METER KIT AND SUPPLIES KIT    Inject 1 each into the skin 2 (two) times daily. Dispense based on patient and insurance preference. Use up to four times daily as directed. (FOR ICD-9 250.00, 250.01).   DICLOFENAC SODIUM (VOLTAREN) 1 % GEL    Apply 2 g topically 4 (four) times daily as needed (shoulder pain).   FLUTICASONE (FLONASE) 50 MCG/ACT NASAL SPRAY    Place 1 spray into both nostrils daily.   GLUCOSE BLOOD (ONE TOUCH ULTRA TEST) TEST STRIP    Use to test blood sugar twice daily.  DX: E11.9   GUAIFENESIN (MUCINEX) 600 MG 12 HR TABLET    Take 2 tablets (1,200 mg total) by mouth 2 (two) times daily as needed for cough or to loosen phlegm.   HYDRALAZINE (APRESOLINE) 25 MG TABLET    TAKE 1 TABLET BY MOUTH TWICE A DAY   IPRATROPIUM-ALBUTEROL (DUONEB) 0.5-2.5 (3) MG/3ML SOLN    Take 3 mLs by nebulization every 6 (six) hours as needed (wheezing).   LANCET DEVICES (ONE TOUCH DELICA LANCING DEV) MISC    Use to test blood sugar once daily dx: E119   LANCETS (ONETOUCH ULTRASOFT) LANCETS    Use to test blood sugar up to three times daily. Dx E11.9   LORATADINE (CLARITIN) 10 MG TABLET    Take 1 tablet (10 mg total) by mouth daily.   METOPROLOL TARTRATE (LOPRESSOR) 50 MG TABLET    TAKE 1 TABLET BY MOUTH TWICE A DAY   MONTELUKAST (SINGULAIR) 10 MG TABLET    TAKE 1 TABLET BY MOUTH EVERYDAY AT BEDTIME   PAROXETINE (PAXIL) 40 MG TABLET    Take 1 tablet (40 mg total) by mouth daily.   SODIUM CHLORIDE (OCEAN) 0.65 % SOLN NASAL SPRAY    Place 1 spray into both nostrils as needed for congestion.   TRAZODONE  (DESYREL) 50 MG TABLET    TAKE 1 TABLET BY MOUTH EVERYDAY AT BEDTIME   TRIMETHOPRIM-POLYMYXIN B (POLYTRIM) OPHTHALMIC SOLUTION    INSTILL ONE DROP INTO RIGHT EYE 4 TIMES A DAY FOR 2 DAYS AFTER EACH MONTHLY EYE INJECTION   VITAMIN D, ERGOCALCIFEROL, (DRISDOL) 1.25 MG (50000 UNIT) CAPS CAPSULE    TAKE 1 CAPSULE BY MOUTH ONE TIME PER WEEK  Modified Medications   No medications on file  Discontinued Medications   No medications on file  Physical Exam:  Vitals:   09/12/21 1003  BP: 120/60  Pulse: (!) 50  Temp: (!) 97.1 F (36.2 C)  SpO2: 93%  Weight: 168 lb (76.2 kg)  Height: _0  (1.702 m)   Body mass index is 26.31 kg/m. Wt Readings from Last 3 Encounters:  09/12/21 168 lb (76.2 kg)  08/15/21 166 lb (75.3 kg)  08/07/21 169 lb 12.8 oz (77 kg)    Physical Exam Constitutional:      General: He is not in acute distress.    Appearance: Normal appearance. He is normal weight.  HENT:     Right Ear: Tympanic membrane and ear canal normal. There is impacted cerumen.     Left Ear: Tympanic membrane and ear canal normal.     Mouth/Throat:     Mouth: Mucous membranes are moist.     Pharynx: Oropharynx is clear.  Eyes:     Conjunctiva/sclera: Conjunctivae normal.  Cardiovascular:     Rate and Rhythm: Normal rate and regular rhythm.     Pulses: Normal pulses.     Heart sounds: Normal heart sounds.  Pulmonary:     Effort: Pulmonary effort is normal.     Breath sounds: Normal breath sounds.  Abdominal:     General: Abdomen is flat. Bowel sounds are normal.  Musculoskeletal:        General: Normal range of motion.  Skin:    General: Skin is warm and dry.  Neurological:     Mental Status: He is alert and oriented to person, place, and time.  Psychiatric:        Mood and Affect: Mood normal.        Behavior: Behavior normal.        Thought Content: Thought content normal.        Judgment: Judgment normal.    Labs reviewed: Basic Metabolic Panel: Recent Labs     10/10/20 0926 06/25/21 1355  NA 143 142  K 4.4 4.4  CL 111* 109  CO2 23 25  GLUCOSE 141* 99  BUN 51* 47*  CREATININE 2.89* 2.95*  CALCIUM 9.9 9.5   Liver Function Tests: Recent Labs    10/10/20 0926 06/25/21 1355  AST 15 17  ALT 11 14  BILITOT 0.4 0.3  PROT 6.0* 5.9*   No results for input(s): LIPASE, AMYLASE in the last 8760 hours. No results for input(s): AMMONIA in the last 8760 hours. CBC: Recent Labs    06/25/21 1355 07/10/21 1357 08/06/21 1345  WBC 6.5 8.2 7.7  NEUTROABS 3,322 4,100 3.9  HGB 10.9* 10.4* 11.0*  HCT 33.0* 31.8* 34.0*  MCV 100.3* 99.4 101.2*  PLT 128* 127* 145*   Lipid Panel: Recent Labs    10/10/20 0926  CHOL 164  HDL 42  LDLCALC 99  TRIG 129  CHOLHDL 3.9   TSH: No results for input(s): TSH in the last 8760 hours. A1C: Lab Results  Component Value Date   HGBA1C 5.8 (H) 06/25/2021     Assessment/Plan 1. Essential hypertension -Takes Norvasc 10 mg , no issues with leg swelling. -Bp is stable and controlled.  Continue medication and dietary modifications.   2. Type 2 diabetes mellitus with stage 4 chronic kidney disease, without long-term current use of insulin ([place]) -Diabetes controlled at home ,continue dietary modification and physical activity. -Last A1C was 5/8 on 06/25/21- off all medication at this time. Will follow up a1c when due - Hemoglobin A1c; Future - CMP with eGFR([facility]); Future  3. Major depressive  disorder, in full remission ([place]) -recently started on  Paxil 40 mg, doing well on medication, reports depression has improved  -Takes Trazodone 50 mg as well. Will continue current regimen  4. Vitamin D deficiency - Takes Vit D supplement weekly  5. Impacted cerumen of right ear - Flushed in office with good success.   6. CKD stage 4 due to type 2 diabetes mellitus (H[facility]) Chronic and stable Encourage proper hydration Follow metabolic panel Avoid nephrotoxic meds ([facility])  7. COPD with asthma ([place]) -No  issues, stable, will continue current regimen.   8. Hyperlipidemia associated with type 2 diabetes mellitus ([place]) -Takes Lipitor 80 mg  - Lipid panel; Future  9. Anemia with low platelet count ([facility]) - Has improved, Hgb is now 11.0 on 08/06/21, was 10.4 on 07/10/21 and 10.9 on 06/25/21 -Platelet count is now 145 on 1/30.23 , was 127 on 07/10/21 and 128 on 06/25/21 -followed by hematology continue to monitor.  Next appt: Return in about 4 months (around 01/12/2022) for routine follow up. I personally was present during the history, physical exam and medical decision-making activities of this service and have verified that the service and findings are accurately documented in the students note Janett Billow K. [place], [facility] [phone]

## 2021-09-14 ENCOUNTER — Ambulatory Visit: Payer: PPO | Admitting: Nurse Practitioner

## 2021-10-01 ENCOUNTER — Encounter (INDEPENDENT_AMBULATORY_CARE_PROVIDER_SITE_OTHER): Payer: PPO | Admitting: Ophthalmology

## 2021-10-01 ENCOUNTER — Other Ambulatory Visit: Payer: Self-pay

## 2021-10-01 DIAGNOSIS — H43813 Vitreous degeneration, bilateral: Secondary | ICD-10-CM

## 2021-10-01 DIAGNOSIS — H353211 Exudative age-related macular degeneration, right eye, with active choroidal neovascularization: Secondary | ICD-10-CM | POA: Diagnosis not present

## 2021-10-01 DIAGNOSIS — H34832 Tributary (branch) retinal vein occlusion, left eye, with macular edema: Secondary | ICD-10-CM

## 2021-10-01 DIAGNOSIS — I10 Essential (primary) hypertension: Secondary | ICD-10-CM | POA: Diagnosis not present

## 2021-10-01 DIAGNOSIS — H353122 Nonexudative age-related macular degeneration, left eye, intermediate dry stage: Secondary | ICD-10-CM

## 2021-10-01 DIAGNOSIS — H2512 Age-related nuclear cataract, left eye: Secondary | ICD-10-CM | POA: Diagnosis not present

## 2021-10-01 DIAGNOSIS — H35033 Hypertensive retinopathy, bilateral: Secondary | ICD-10-CM

## 2021-10-15 ENCOUNTER — Other Ambulatory Visit: Payer: Self-pay | Admitting: Family

## 2021-10-15 DIAGNOSIS — F418 Other specified anxiety disorders: Secondary | ICD-10-CM

## 2021-10-15 NOTE — Telephone Encounter (Signed)
High risk or very high risk warning populated when attempting to refill medication. RX request sent to PCP for review and approval if warranted.   

## 2021-11-05 ENCOUNTER — Other Ambulatory Visit: Payer: Self-pay | Admitting: Nurse Practitioner

## 2021-11-05 NOTE — Telephone Encounter (Signed)
[facility] requested refill.  ?Pended Rx and sent to [facility] for approval due to [place].  ?

## 2021-11-19 ENCOUNTER — Encounter: Payer: Self-pay | Admitting: Nurse Practitioner

## 2021-11-19 ENCOUNTER — Encounter (INDEPENDENT_AMBULATORY_CARE_PROVIDER_SITE_OTHER): Payer: PPO | Admitting: Ophthalmology

## 2021-11-19 ENCOUNTER — Ambulatory Visit (INDEPENDENT_AMBULATORY_CARE_PROVIDER_SITE_OTHER): Payer: PPO | Admitting: Nurse Practitioner

## 2021-11-19 VITALS — BP 148/78 | HR 53 | Temp 98.1°F | Ht 67.0 in | Wt 168.8 lb

## 2021-11-19 DIAGNOSIS — J302 Other seasonal allergic rhinitis: Secondary | ICD-10-CM

## 2021-11-19 NOTE — Patient Instructions (Addendum)
Stop Claritin and start zyrtec 10 mg daily  ?Can add flonase 1 spray into both nares daily- this is OTC ?Netipot or saline wash daily ?Plain nasal saline spray throughout the day as needed ?May use tylenol 500 mg 2 tablets every 8 hours as needed aches and pains or sore throat ?Keep well hydrated ?Avoid forcefully blowing nose ? ? ?

## 2021-11-19 NOTE — Progress Notes (Signed)
? ? ?Careteam: ?Patient Care Team: ?Lauree Chandler, NP as PCP - General (Geriatric Medicine) ?Zebedee Iba., MD as Referring Physician (Ophthalmology) ?Chesley Mires, MD as Consulting Physician (Pulmonary Disease) ?Lavonna Monarch, MD as Consulting Physician (Dermatology) ?Ladell Pier, MD as Consulting Physician (Oncology) ? ?PLACE OF SERVICE:  ?[facility]  ?Advanced Directive information ?Does Patient Have a Medical Advance Directive?: Yes, Type of Advance Directive: [place];Living will, Does patient want to make changes to medical advance directive?: No - Patient declined ? ?Allergies  ?Allergen Reactions  ? Ace Inhibitors Cough  ? ? ?Chief Complaint  ?Patient presents with  ? Acute Visit  ?  Patient is here for CC of sore throat, runny nose, headaches,bilateral ear pain, runny nose, cough and sinus pressure for 2 weeks now. He reports taking Advil.  ? ? ? ?HPI: Patient is a 86 y.o. male for not feeling well.  ?Had a sore throat that has improved but was ongoing for 1.5 weeks.  ?Still there but better.  ?Reports he has been in the bed sneezing. Had a hard time sleeping.  ?Coughing some. Nonproductive.  ?No chest congestion.  ?Nose is running, nasal congestion.  ?No fever.  ?Eyes not itching but having some clear drainage.  ?Reports he does get seasonal allergies, taking claritin.  ?Feels like it is bothering him more now.  ? ?Review of Systems:  ?Review of Systems  ?Constitutional:  Negative for chills, fever and weight loss.  ?HENT:  Positive for congestion and sore throat. Negative for tinnitus.   ?Respiratory:  Negative for cough, sputum production, shortness of breath and wheezing.   ?Cardiovascular:  Negative for chest pain, palpitations and leg swelling.  ?Gastrointestinal:  Negative for abdominal pain, constipation, diarrhea and heartburn.  ?Skin: Negative.   ?Neurological:  Negative for dizziness and headaches.  ? ?Past Medical History:  ?Diagnosis Date  ? Allergy   ?  Alzheimer's disease ([place])   ? Anemia, unspecified   ? Anxiety   ? Chronic maxillary sinusitis   ? COPD (chronic obstructive pulmonary disease) ([place])   ? Depression   ? Diabetes mellitus   ? Edema   ? Hypercalcemia   ? Hyperlipidemia   ? Hypertension   ? Hypertrophy of prostate without urinary obstruction and other lower urinary tract symptoms (LUTS)   ? Hypertrophy of prostate without urinary obstruction and other lower urinary tract symptoms (LUTS)   ? Kidney disease   ? mild  ? Unspecified disorder of kidney and ureter   ? Unspecified sleep apnea   ? Ventral hernia, unspecified, without mention of obstruction or gangrene   ? ?Past Surgical History:  ?Procedure Laterality Date  ? EXPLORATORY LAPAROTOMY  2002  ? HERNIA REPAIR    ? ?Social History: ?  reports that he quit smoking about 33 years ago. His smoking use included cigarettes. He has a 45.00 pack-year smoking history. He has never used smokeless tobacco. He reports that he does not drink alcohol and does not use drugs. ? ?Family History  ?Problem Relation Age of Onset  ? Pancreatic cancer Sister   ? Heart disease Father   ? Hypertension Brother   ? Hypertension Sister   ? ? ?Medications: ?Patient's Medications  ?New Prescriptions  ? No medications on file  ?Previous Medications  ? ACETAMINOPHEN (TYLENOL) 500 MG TABLET    Take 1,000 mg by mouth every 8 (eight) hours as needed.  ? ALBUTEROL (VENTOLIN HFA) 108 (90 BASE) MCG/ACT INHALER    Inhale  2 puffs into the lungs every 6 (six) hours as needed for wheezing or shortness of breath.  ? AMLODIPINE (NORVASC) 10 MG TABLET    TAKE 1 TABLET BY MOUTH EVERY DAY  ? ASPIRIN LOW DOSE 81 MG EC TABLET    TAKE 1 TABLET BY MOUTH DAILY SWALLOW WHOLE  ? ATORVASTATIN (LIPITOR) 80 MG TABLET    TAKE 1 TABLET BY MOUTH EVERY DAY  ? BESIVANCE 0.6 % SUSP    INSTILL ONE DROP INTO RIGHT EYE 4 TIMES A DAY FOR 2 DAYS AFTER EACH MONTHLY EYE INJECTION  ? BISACODYL 5 MG EC TABLET    Take 1 tablet (5 mg total) by mouth daily as needed for  moderate constipation.  ? BLOOD GLUCOSE METER KIT AND SUPPLIES KIT    Inject 1 each into the skin 2 (two) times daily. Dispense based on patient and insurance preference. Use up to four times daily as directed. (FOR ICD-9 250.00, 250.01).  ? DICLOFENAC SODIUM (VOLTAREN) 1 % GEL    Apply 2 g topically 4 (four) times daily as needed (shoulder pain).  ? FLUTICASONE (FLONASE) 50 MCG/ACT NASAL SPRAY    Place 1 spray into both nostrils daily.  ? GLUCOSE BLOOD (ONE TOUCH ULTRA TEST) TEST STRIP    Use to test blood sugar twice daily.  DX: E11.9  ? GUAIFENESIN (MUCINEX) 600 MG 12 HR TABLET    Take 2 tablets (1,200 mg total) by mouth 2 (two) times daily as needed for cough or to loosen phlegm.  ? HYDRALAZINE (APRESOLINE) 25 MG TABLET    TAKE 1 TABLET BY MOUTH TWICE A DAY  ? IPRATROPIUM-ALBUTEROL (DUONEB) 0.5-2.5 (3) MG/3ML SOLN    Take 3 mLs by nebulization every 6 (six) hours as needed (wheezing).  ? LANCET DEVICES (ONE TOUCH DELICA LANCING DEV) MISC    Use to test blood sugar once daily dx: E119  ? LORATADINE (CLARITIN) 10 MG TABLET    Take 1 tablet (10 mg total) by mouth daily.  ? METOPROLOL TARTRATE (LOPRESSOR) 50 MG TABLET    TAKE 1 TABLET BY MOUTH TWICE A DAY  ? MONTELUKAST (SINGULAIR) 10 MG TABLET    TAKE 1 TABLET BY MOUTH EVERYDAY AT BEDTIME  ? PAROXETINE (PAXIL) 40 MG TABLET    TAKE 1 TABLET BY MOUTH EVERY DAY  ? SODIUM CHLORIDE (OCEAN) 0.65 % SOLN NASAL SPRAY    Place 1 spray into both nostrils as needed for congestion.  ? TRAZODONE (DESYREL) 50 MG TABLET    TAKE 1 TABLET BY MOUTH EVERYDAY AT BEDTIME  ? TRIMETHOPRIM-POLYMYXIN B (POLYTRIM) OPHTHALMIC SOLUTION    INSTILL ONE DROP INTO RIGHT EYE 4 TIMES A DAY FOR 2 DAYS AFTER EACH MONTHLY EYE INJECTION  ? VITAMIN D, ERGOCALCIFEROL, (DRISDOL) 1.25 MG (50000 UNIT) CAPS CAPSULE    TAKE 1 CAPSULE BY MOUTH ONE TIME PER WEEK  ?Modified Medications  ? No medications on file  ?Discontinued Medications  ? No medications on file  ? ? ?Physical Exam: ? ?Vitals:  ? 11/19/21 1258   ?BP: (!) 148/78  ?Pulse: (!) 53  ?Temp: 98.1 ?F (36.7 ?C)  ?SpO2: 98%  ?Weight: 168 lb 12.8 oz (76.6 kg)  ?Height: 5' 7"  (1.702 m)  ? ?Body mass index is 26.44 kg/m?. ?Wt Readings from Last 3 Encounters:  ?11/19/21 168 lb 12.8 oz (76.6 kg)  ?09/12/21 168 lb (76.2 kg)  ?08/15/21 166 lb (75.3 kg)  ? ? ?Physical Exam ?Constitutional:   ?   General: He is not in  acute distress. ?   Appearance: He is well-developed. He is not diaphoretic.  ?HENT:  ?   Head: Normocephalic and atraumatic.  ?   Right Ear: Tympanic membrane, ear canal and external ear normal. There is no impacted cerumen.  ?   Left Ear: Tympanic membrane, ear canal and external ear normal. There is no impacted cerumen.  ?   Nose: Congestion and rhinorrhea present.  ?   Mouth/Throat:  ?   Mouth: Mucous membranes are moist.  ?   Pharynx: No oropharyngeal exudate or posterior oropharyngeal erythema.  ?Eyes:  ?   Conjunctiva/sclera: Conjunctivae normal.  ?   Pupils: Pupils are equal, round, and reactive to light.  ?Cardiovascular:  ?   Rate and Rhythm: Normal rate and regular rhythm.  ?   Heart sounds: Normal heart sounds.  ?Pulmonary:  ?   Effort: Pulmonary effort is normal.  ?   Breath sounds: Normal breath sounds.  ?Musculoskeletal:  ?   Cervical back: Normal range of motion and neck supple.  ?Skin: ?   General: Skin is warm and dry.  ?Neurological:  ?   Mental Status: He is alert and oriented to person, place, and time.  ? ? ?Labs reviewed: ?Basic Metabolic Panel: ?Recent Labs  ?  06/25/21 ?6967  ?NA 142  ?K 4.4  ?CL 109  ?CO2 25  ?GLUCOSE 99  ?BUN 47*  ?CREATININE 2.95*  ?CALCIUM 9.5  ? ?Liver Function Tests: ?Recent Labs  ?  06/25/21 ?1355  ?AST 17  ?ALT 14  ?BILITOT 0.3  ?PROT 5.9*  ? ?No results for input(s): LIPASE, AMYLASE in the last 8760 hours. ?No results for input(s): AMMONIA in the last 8760 hours. ?CBC: ?Recent Labs  ?  06/25/21 ?1355 07/10/21 ?1357 08/06/21 ?1345  ?WBC 6.5 8.2 7.7  ?NEUTROABS 3,322 4,100 3.9  ?HGB 10.9* 10.4* 11.0*  ?HCT 33.0*  31.8* 34.0*  ?MCV 100.3* 99.4 101.2*  ?PLT 128* 127* 145*  ? ?Lipid Panel: ?No results for input(s): CHOL, HDL, LDLCALC, TRIG, CHOLHDL, LDLDIRECT in the last 8760 hours. ?TSH: ?No results for input(s): TSH in

## 2021-11-27 ENCOUNTER — Ambulatory Visit
Admission: RE | Admit: 2021-11-27 | Discharge: 2021-11-27 | Disposition: A | Discharge status: Home / Self Care | Payer: PPO | Source: Ambulatory Visit | Attending: Family | Admitting: Family

## 2021-11-27 ENCOUNTER — Encounter: Payer: Self-pay | Admitting: Family

## 2021-11-27 ENCOUNTER — Ambulatory Visit (INDEPENDENT_AMBULATORY_CARE_PROVIDER_SITE_OTHER): Payer: PPO | Admitting: Family

## 2021-11-27 VITALS — BP 118/62 | HR 63 | Temp 97.8°F | Ht 67.0 in | Wt 169.2 lb

## 2021-11-27 DIAGNOSIS — R059 Cough, unspecified: Secondary | ICD-10-CM | POA: Diagnosis not present

## 2021-11-27 DIAGNOSIS — R0602 Shortness of breath: Secondary | ICD-10-CM | POA: Diagnosis not present

## 2021-11-27 DIAGNOSIS — J441 Chronic obstructive pulmonary disease with (acute) exacerbation: Secondary | ICD-10-CM

## 2021-11-27 MED ORDER — DOXYCYCLINE HYCLATE 100 MG PO TABS
100.0000 mg | ORAL_TABLET | Freq: Two times a day (BID) | ORAL | 0 refills | Status: AC
Start: 2021-11-27 — End: 2021-12-04

## 2021-11-27 MED ORDER — IPRATROPIUM-ALBUTEROL 0.5-2.5 (3) MG/3ML IN SOLN: 3.0000 mL | Freq: Four times a day (QID) | RESPIRATORY_TRACT | 3 refills | Status: DC | PRN

## 2021-11-27 MED ORDER — GUAIFENESIN ER 600 MG PO TB12: 1200.0000 mg | ORAL_TABLET | Freq: Two times a day (BID) | ORAL | Status: DC | PRN

## 2021-11-27 NOTE — Patient Instructions (Addendum)
-   Please get chest X-ray at [place] at [facility] then will call you with results.  - Use Duoneb nebulizing solution via nebulizer every 6 hrs for wheezing or shortness of breath

## 2021-11-27 NOTE — Addendum Note (Signed)
Addended byMarlowe Sax C on: 11/27/2021 05:04 PM   Modules accepted: Level of Service

## 2021-11-27 NOTE — Progress Notes (Addendum)
Provider: Chandelle Harkey FNP-C  Lauree Chandler, NP  Patient Care Team: Lauree Chandler, NP as PCP - General (Geriatric Medicine) Zebedee Iba., MD as Referring Physician (Ophthalmology) Chesley Mires, MD as Consulting Physician (Pulmonary Disease) Lavonna Monarch, MD as Consulting Physician (Dermatology) Ladell Pier, MD as Consulting Physician (Oncology)  Extended Emergency Contact Information Primary Emergency Contact: Carlile,Mike Mobile Phone: [phone] Relation: Son Secondary Emergency Contact: Stelly,Matt  [place] Mobile Phone: [phone] Relation: Son  Code Status:  DNR Goals of care: Advanced Directive information    11/27/2021    1:03 PM  Advanced Directives  Does Patient Have a Medical Advance Directive? Yes  Type of Paramedic of [place];Living will  Does patient want to make changes to medical advance directive? No - Patient declined  Copy of [place] in Chart? Yes - validated most recent copy scanned in chart (See row information)     Chief Complaint  Patient presents with   Acute Visit    Patient complains of nasal congestion. Patient states that it has been going on for 2 weeks.  Patient was last seen on 5/15.     HPI:  Pt is a 86 y.o. male seen today for an acute visit for evaluation of nasal congestion x 2 weeks.Has nasal drainage.Has not been taking loratadine and Singulair.  Feels like chest wheezes at times. Sometimes able to cough up whitish phlegm.  Denies any fever,chills,shortness of breath.    Past Medical History:  Diagnosis Date   Allergy    Alzheimer's disease ([place])    Anemia, unspecified    Anxiety    Chronic maxillary sinusitis    COPD (chronic obstructive pulmonary disease) (HCC)    Depression    Diabetes mellitus    Edema    Hypercalcemia    Hyperlipidemia    Hypertension    Hypertrophy of prostate without urinary obstruction and other lower urinary  tract symptoms (LUTS)    Hypertrophy of prostate without urinary obstruction and other lower urinary tract symptoms (LUTS)    Kidney disease    mild   Unspecified disorder of kidney and ureter    Unspecified sleep apnea    Ventral hernia, unspecified, without mention of obstruction or gangrene    Past Surgical History:  Procedure Laterality Date   EXPLORATORY LAPAROTOMY  2002   HERNIA REPAIR      Allergies  Allergen Reactions   Ace Inhibitors Cough    Outpatient Encounter Medications as of 11/27/2021  Medication Sig   acetaminophen (TYLENOL) 500 MG tablet Take 1,000 mg by mouth every 8 (eight) hours as needed.   albuterol (VENTOLIN HFA) 108 (90 Base) MCG/ACT inhaler Inhale 2 puffs into the lungs every 6 (six) hours as needed for wheezing or shortness of breath.   amLODipine (NORVASC) 10 MG tablet TAKE 1 TABLET BY MOUTH EVERY DAY   ASPIRIN LOW DOSE 81 MG EC tablet TAKE 1 TABLET BY MOUTH DAILY SWALLOW WHOLE   atorvastatin (LIPITOR) 80 MG tablet TAKE 1 TABLET BY MOUTH EVERY DAY   BESIVANCE 0.6 % SUSP INSTILL ONE DROP INTO RIGHT EYE 4 TIMES A DAY FOR 2 DAYS AFTER EACH MONTHLY EYE INJECTION   bisacodyl 5 MG EC tablet Take 1 tablet (5 mg total) by mouth daily as needed for moderate constipation.   blood glucose meter kit and supplies KIT Inject 1 each into the skin 2 (two) times daily. Dispense based on patient and insurance preference. Use up  to four times daily as directed. (FOR ICD-9 250.00, 250.01).   diclofenac sodium (VOLTAREN) 1 % GEL Apply 2 g topically 4 (four) times daily as needed (shoulder pain).   fluticasone (FLONASE) 50 MCG/ACT nasal spray Place 1 spray into both nostrils daily.   glucose blood (ONE TOUCH ULTRA TEST) test strip Use to test blood sugar twice daily.  DX: E11.9   guaiFENesin (MUCINEX) 600 MG 12 hr tablet Take 2 tablets (1,200 mg total) by mouth 2 (two) times daily as needed for cough or to loosen phlegm.   hydrALAZINE (APRESOLINE) 25 MG tablet TAKE 1 TABLET BY  MOUTH TWICE A DAY   ipratropium-albuterol (DUONEB) 0.5-2.5 (3) MG/3ML SOLN Take 3 mLs by nebulization every 6 (six) hours as needed (wheezing).   Lancet Devices (ONE TOUCH DELICA LANCING DEV) MISC Use to test blood sugar once daily dx: E119   loratadine (CLARITIN) 10 MG tablet Take 1 tablet (10 mg total) by mouth daily.   metoprolol tartrate (LOPRESSOR) 50 MG tablet TAKE 1 TABLET BY MOUTH TWICE A DAY   montelukast (SINGULAIR) 10 MG tablet TAKE 1 TABLET BY MOUTH EVERYDAY AT BEDTIME   PARoxetine (PAXIL) 40 MG tablet TAKE 1 TABLET BY MOUTH EVERY DAY   sodium chloride (OCEAN) 0.65 % SOLN nasal spray Place 1 spray into both nostrils as needed for congestion.   traZODone (DESYREL) 50 MG tablet TAKE 1 TABLET BY MOUTH EVERYDAY AT BEDTIME   trimethoprim-polymyxin b (POLYTRIM) ophthalmic solution INSTILL ONE DROP INTO RIGHT EYE 4 TIMES A DAY FOR 2 DAYS AFTER EACH MONTHLY EYE INJECTION   Vitamin D, Ergocalciferol, (DRISDOL) 1.25 MG (50000 UNIT) CAPS capsule TAKE 1 CAPSULE BY MOUTH ONE TIME PER WEEK   No facility-administered encounter medications on file as of 11/27/2021.    Review of Systems  Constitutional:  Negative for chills, fatigue and fever.  HENT:  Positive for congestion, hearing loss, rhinorrhea and sore throat. Negative for sinus pressure, sinus pain and sneezing.   Eyes:  Negative for discharge, redness and itching.  Respiratory:  Positive for cough and wheezing.   Neurological:  Positive for headaches. Negative for dizziness and light-headedness.   Immunization History  Administered Date(s) Administered   Fluad Quad(high Dose 65+) 05/27/2019, 03/30/2020, 06/25/2021   Influenza Split 04/08/2011   Influenza Whole 04/07/2012   Influenza, High Dose Seasonal PF 04/07/2017, 05/25/2018, 07/22/2019   Influenza,inj,Quad PF,6+ Mos 04/23/2013, 03/28/2014, 02/23/2016   Influenza-Unspecified 04/08/2015, 07/09/2015, 04/08/2017   Moderna Sars-Covid-2 Vaccination 10/07/2019, 11/07/2019, 05/10/2020    Pneumococcal Conjugate-13 06/06/2014   Pneumococcal-Unspecified 07/09/2007   Td 08/09/1997   Tdap 08/12/2011   Pertinent  Health Maintenance Due  Topic Date Due   OPHTHALMOLOGY EXAM  10/04/2021   HEMOGLOBIN A1C  12/24/2021   INFLUENZA VACCINE  02/05/2022   FOOT EXAM  06/25/2022      02/12/2021    9:54 AM 06/25/2021    1:11 PM 07/20/2021    2:47 PM 08/15/2021    2:00 PM 09/12/2021   10:01 AM  Fall Risk  Falls in the past year? 0 0 0 0 0  Was there an injury with Fall? 0 0 0 0 0  Fall Risk Category Calculator 0 0 0 0 0  Fall Risk Category _0   Patient Fall Risk Level _1   Patient at Risk for Falls Due to No Fall Risks No Fall Risks No Fall Risks No Fall Risks No Fall  Risks  Fall risk Follow up _0    Functional Status Survey:    Vitals:   11/27/21 1256  BP: 118/62  Pulse: 63  Temp: 97.8 F (36.6 C)  TempSrc: Temporal  SpO2: 90%  Weight: 169 lb 3.2 oz (76.7 kg)  Height: _1  (1.702 m)   Body mass index is 26.5 kg/m. Physical Exam Vitals reviewed.  Constitutional:      General: He is not in acute distress.    Appearance: Normal appearance. He is normal weight. He is not ill-appearing or diaphoretic.  HENT:     Head: Normocephalic.     Right Ear: Tympanic membrane, ear canal and external ear normal. There is no impacted cerumen.     Left Ear: There is impacted cerumen.     Ears:     Comments: Hearing aids in place     Nose: Nose normal. No congestion or rhinorrhea.     Mouth/Throat:     Mouth: Mucous membranes are moist.     Pharynx: Oropharynx is clear. No oropharyngeal exudate or posterior oropharyngeal erythema.  Eyes:     General: No scleral icterus.       Right eye: No discharge.        Left eye: No discharge.     Extraocular Movements: Extraocular  movements intact.     Conjunctiva/sclera: Conjunctivae normal.     Pupils: Pupils are equal, round, and reactive to light.  Cardiovascular:     Rate and Rhythm: Normal rate and regular rhythm.     Pulses: Normal pulses.     Heart sounds: Normal heart sounds. No murmur heard.   No friction rub. No gallop.  Pulmonary:     Effort: Pulmonary effort is normal. No respiratory distress.     Breath sounds: Examination of the right-upper field reveals wheezing. Examination of the left-upper field reveals wheezing. Examination of the left-middle field reveals wheezing. Examination of the right-lower field reveals decreased breath sounds. Examination of the left-lower field reveals decreased breath sounds. Decreased breath sounds and wheezing present. No rhonchi or rales.  Chest:     Chest wall: No tenderness.  Abdominal:     General: Bowel sounds are normal. There is no distension.     Palpations: Abdomen is soft. There is no mass.     Tenderness: There is no abdominal tenderness. There is no right CVA tenderness, left CVA tenderness, guarding or rebound.  Musculoskeletal:        General: No swelling or tenderness. Normal range of motion.     Cervical back: Normal range of motion. No tenderness.     Right lower leg: No edema.     Left lower leg: No edema.  Skin:    General: Skin is warm and dry.     Coloration: Skin is not pale.     Findings: No erythema or rash.  Neurological:     Mental Status: He is alert. Mental status is at baseline.     Motor: No weakness.     Gait: Gait abnormal.  Psychiatric:        Mood and Affect: Mood normal.        Speech: Speech normal.        Behavior: Behavior normal.    Labs reviewed: Recent Labs    06/25/21 1355  NA 142  K 4.4  CL 109  CO2 25  GLUCOSE 99  BUN 47*  CREATININE 2.95*  CALCIUM 9.5   Recent Labs    06/25/21 1355  AST 17  ALT 14  BILITOT 0.3  PROT 5.9*   Recent Labs    06/25/21 1355 07/10/21 1357 08/06/21 1345  WBC 6.5  8.2 7.7  NEUTROABS 3,322 4,100 3.9  HGB 10.9* 10.4* 11.0*  HCT 33.0* 31.8* 34.0*  MCV 100.3* 99.4 101.2*  PLT 128* 127* 145*   No results found for: TSH Lab Results  Component Value Date   HGBA1C 5.8 (H) 06/25/2021   Lab Results  Component Value Date   CHOL 164 10/10/2020   HDL 42 10/10/2020   LDLCALC 99 10/10/2020   TRIG 129 10/10/2020   CHOLHDL 3.9 10/10/2020    Significant Diagnostic Results in last 30 days:  No results found.  Assessment/Plan  1. Cough, unspecified type Afebrile Bilateral upper lobe expiration wheezes with diminished bases. - will obtain imaging  Start on doxycycline as below  - DG Chest 2 View; Future - doxycycline (VIBRA-TABS) 100 MG tablet; Take 1 tablet (100 mg total) by mouth 2 (two) times daily for 7 days.  Dispense: 14 tablet; Refill: 0  2. COPD exacerbation (HCC) Worsening cough and wheezing. Has no Duobneb or nebulizing machine at home.Has been using Albuterol  Will refill Duoneb and order nebulizer.  - ipratropium-albuterol (DUONEB) 0.5-2.5 (3) MG/3ML SOLN; Take 3 mLs by nebulization every 6 (six) hours as needed (wheezing).  Dispense: 360 mL; Refill: 3 - doxycycline (VIBRA-TABS) 100 MG tablet; Take 1 tablet (100 mg total) by mouth 2 (two) times daily for 7 days.  Dispense: 14 tablet; Refill: 0 - For home use only DME Other see comment: Nebulizer machine use every 6 hrs with nebulizing solution for cough,wheezing or shortness of breath  Family/ staff Communication: Reviewed plan of care with patient verbalized understanding   Labs/tests ordered: - DG Chest 2 View; Future  Next Appointment: As needed if symptoms worsen or fail to improve    Sandrea Hughs, NP

## 2021-11-29 ENCOUNTER — Telehealth: Payer: Self-pay | Admitting: *Deleted

## 2021-11-29 NOTE — Telephone Encounter (Signed)
Son, Ian [name] Notified and stated that he will go and get the supplies from the medical supply store. Stated that he will take the Nebulizer machine with him to get the correct supplies.  Agreed.

## 2021-11-29 NOTE — Telephone Encounter (Signed)
Ian [name], son called and left message on Clinical intake stating that patient was prescribed Nebulizing machine but no Tubing/mask.   I called [facility] and spoke with pharmacist and she stated that they use to carry the Nebulizing supplies but no longer. Stated that patient would need to go to a Medical Supply store to get the Tubing/mask for the machine.   Tried calling son, Ian [name] and [facility] to return call.

## 2021-12-02 ENCOUNTER — Other Ambulatory Visit: Payer: Self-pay | Admitting: Nurse Practitioner

## 2021-12-02 ENCOUNTER — Other Ambulatory Visit: Payer: Self-pay | Admitting: Family

## 2021-12-02 DIAGNOSIS — R059 Cough, unspecified: Secondary | ICD-10-CM

## 2021-12-02 DIAGNOSIS — J449 Chronic obstructive pulmonary disease, unspecified: Secondary | ICD-10-CM

## 2021-12-02 DIAGNOSIS — J441 Chronic obstructive pulmonary disease with (acute) exacerbation: Secondary | ICD-10-CM

## 2021-12-04 ENCOUNTER — Inpatient Hospital Stay: Payer: PPO | Attending: Nurse Practitioner

## 2021-12-04 ENCOUNTER — Inpatient Hospital Stay: Payer: PPO | Admitting: Nurse Practitioner

## 2021-12-05 ENCOUNTER — Encounter (INDEPENDENT_AMBULATORY_CARE_PROVIDER_SITE_OTHER): Payer: PPO | Admitting: Ophthalmology

## 2021-12-06 ENCOUNTER — Encounter: Payer: Self-pay | Admitting: *Deleted

## 2021-12-06 DIAGNOSIS — N184 Chronic kidney disease, stage 4 (severe): Secondary | ICD-10-CM | POA: Diagnosis not present

## 2021-12-06 DIAGNOSIS — N390 Urinary tract infection, site not specified: Secondary | ICD-10-CM | POA: Diagnosis not present

## 2021-12-06 NOTE — Progress Notes (Signed)
"  No show" for lab/OV on 5/30. Sent scheduling message to schedule for next 1 month.

## 2021-12-07 ENCOUNTER — Encounter (HOSPITAL_BASED_OUTPATIENT_CLINIC_OR_DEPARTMENT_OTHER): Payer: Self-pay | Admitting: Obstetrics and Gynecology

## 2021-12-07 ENCOUNTER — Inpatient Hospital Stay (HOSPITAL_BASED_OUTPATIENT_CLINIC_OR_DEPARTMENT_OTHER)
Admission: EM | Admit: 2021-12-07 | Discharge: 2021-12-14 | DRG: 871 | Disposition: A | Discharge status: Skilled Nursing Facility | Payer: PPO | Attending: Internal Medicine | Admitting: Internal Medicine

## 2021-12-07 ENCOUNTER — Emergency Department (HOSPITAL_BASED_OUTPATIENT_CLINIC_OR_DEPARTMENT_OTHER): Payer: PPO | Admitting: Radiology

## 2021-12-07 ENCOUNTER — Other Ambulatory Visit: Payer: Self-pay

## 2021-12-07 ENCOUNTER — Telehealth: Payer: Self-pay | Admitting: *Deleted

## 2021-12-07 DIAGNOSIS — I739 Peripheral vascular disease, unspecified: Secondary | ICD-10-CM | POA: Diagnosis not present

## 2021-12-07 DIAGNOSIS — Z66 Do not resuscitate: Secondary | ICD-10-CM | POA: Diagnosis present

## 2021-12-07 DIAGNOSIS — I10 Essential (primary) hypertension: Secondary | ICD-10-CM | POA: Diagnosis present

## 2021-12-07 DIAGNOSIS — F418 Other specified anxiety disorders: Secondary | ICD-10-CM | POA: Diagnosis not present

## 2021-12-07 DIAGNOSIS — G309 Alzheimer's disease, unspecified: Secondary | ICD-10-CM | POA: Diagnosis not present

## 2021-12-07 DIAGNOSIS — R5383 Other fatigue: Secondary | ICD-10-CM | POA: Diagnosis not present

## 2021-12-07 DIAGNOSIS — I272 Pulmonary hypertension, unspecified: Secondary | ICD-10-CM | POA: Diagnosis not present

## 2021-12-07 DIAGNOSIS — R6520 Severe sepsis without septic shock: Secondary | ICD-10-CM | POA: Diagnosis present

## 2021-12-07 DIAGNOSIS — D631 Anemia in chronic kidney disease: Secondary | ICD-10-CM | POA: Diagnosis present

## 2021-12-07 DIAGNOSIS — Z87891 Personal history of nicotine dependence: Secondary | ICD-10-CM

## 2021-12-07 DIAGNOSIS — J189 Pneumonia, unspecified organism: Secondary | ICD-10-CM | POA: Diagnosis present

## 2021-12-07 DIAGNOSIS — J449 Chronic obstructive pulmonary disease, unspecified: Secondary | ICD-10-CM | POA: Diagnosis present

## 2021-12-07 DIAGNOSIS — E1159 Type 2 diabetes mellitus with other circulatory complications: Secondary | ICD-10-CM | POA: Diagnosis not present

## 2021-12-07 DIAGNOSIS — E860 Dehydration: Secondary | ICD-10-CM | POA: Diagnosis present

## 2021-12-07 DIAGNOSIS — J9601 Acute respiratory failure with hypoxia: Secondary | ICD-10-CM | POA: Diagnosis not present

## 2021-12-07 DIAGNOSIS — F0283 Dementia in other diseases classified elsewhere, unspecified severity, with mood disturbance: Secondary | ICD-10-CM | POA: Diagnosis not present

## 2021-12-07 DIAGNOSIS — M79671 Pain in right foot: Secondary | ICD-10-CM | POA: Diagnosis not present

## 2021-12-07 DIAGNOSIS — N179 Acute kidney failure, unspecified: Secondary | ICD-10-CM | POA: Diagnosis present

## 2021-12-07 DIAGNOSIS — G4730 Sleep apnea, unspecified: Secondary | ICD-10-CM | POA: Diagnosis present

## 2021-12-07 DIAGNOSIS — D649 Anemia, unspecified: Secondary | ICD-10-CM | POA: Diagnosis not present

## 2021-12-07 DIAGNOSIS — A419 Sepsis, unspecified organism: Secondary | ICD-10-CM

## 2021-12-07 DIAGNOSIS — G9341 Metabolic encephalopathy: Secondary | ICD-10-CM | POA: Diagnosis not present

## 2021-12-07 DIAGNOSIS — M6281 Muscle weakness (generalized): Secondary | ICD-10-CM | POA: Diagnosis not present

## 2021-12-07 DIAGNOSIS — E1122 Type 2 diabetes mellitus with diabetic chronic kidney disease: Secondary | ICD-10-CM | POA: Diagnosis present

## 2021-12-07 DIAGNOSIS — I130 Hypertensive heart and chronic kidney disease with heart failure and stage 1 through stage 4 chronic kidney disease, or unspecified chronic kidney disease: Secondary | ICD-10-CM | POA: Diagnosis not present

## 2021-12-07 DIAGNOSIS — N184 Chronic kidney disease, stage 4 (severe): Secondary | ICD-10-CM | POA: Diagnosis not present

## 2021-12-07 DIAGNOSIS — E785 Hyperlipidemia, unspecified: Secondary | ICD-10-CM | POA: Diagnosis present

## 2021-12-07 DIAGNOSIS — R41841 Cognitive communication deficit: Secondary | ICD-10-CM | POA: Diagnosis not present

## 2021-12-07 DIAGNOSIS — M79604 Pain in right leg: Secondary | ICD-10-CM | POA: Diagnosis not present

## 2021-12-07 DIAGNOSIS — M25571 Pain in right ankle and joints of right foot: Secondary | ICD-10-CM | POA: Diagnosis not present

## 2021-12-07 DIAGNOSIS — R262 Difficulty in walking, not elsewhere classified: Secondary | ICD-10-CM | POA: Diagnosis not present

## 2021-12-07 DIAGNOSIS — I5032 Chronic diastolic (congestive) heart failure: Secondary | ICD-10-CM | POA: Diagnosis present

## 2021-12-07 DIAGNOSIS — M25561 Pain in right knee: Secondary | ICD-10-CM | POA: Diagnosis present

## 2021-12-07 DIAGNOSIS — G928 Other toxic encephalopathy: Secondary | ICD-10-CM | POA: Diagnosis not present

## 2021-12-07 DIAGNOSIS — N189 Chronic kidney disease, unspecified: Secondary | ICD-10-CM

## 2021-12-07 DIAGNOSIS — J301 Allergic rhinitis due to pollen: Secondary | ICD-10-CM

## 2021-12-07 DIAGNOSIS — K5900 Constipation, unspecified: Secondary | ICD-10-CM | POA: Diagnosis present

## 2021-12-07 DIAGNOSIS — J309 Allergic rhinitis, unspecified: Secondary | ICD-10-CM | POA: Diagnosis present

## 2021-12-07 DIAGNOSIS — F32A Depression, unspecified: Secondary | ICD-10-CM | POA: Diagnosis not present

## 2021-12-07 DIAGNOSIS — F0284 Dementia in other diseases classified elsewhere, unspecified severity, with anxiety: Secondary | ICD-10-CM | POA: Diagnosis not present

## 2021-12-07 DIAGNOSIS — M7989 Other specified soft tissue disorders: Secondary | ICD-10-CM | POA: Diagnosis not present

## 2021-12-07 DIAGNOSIS — M1711 Unilateral primary osteoarthritis, right knee: Secondary | ICD-10-CM | POA: Diagnosis not present

## 2021-12-07 DIAGNOSIS — E119 Type 2 diabetes mellitus without complications: Secondary | ICD-10-CM

## 2021-12-07 DIAGNOSIS — R059 Cough, unspecified: Secondary | ICD-10-CM | POA: Diagnosis not present

## 2021-12-07 DIAGNOSIS — Z20822 Contact with and (suspected) exposure to covid-19: Secondary | ICD-10-CM | POA: Diagnosis not present

## 2021-12-07 DIAGNOSIS — R627 Adult failure to thrive: Secondary | ICD-10-CM | POA: Diagnosis present

## 2021-12-07 DIAGNOSIS — M7731 Calcaneal spur, right foot: Secondary | ICD-10-CM | POA: Diagnosis present

## 2021-12-07 DIAGNOSIS — I129 Hypertensive chronic kidney disease with stage 1 through stage 4 chronic kidney disease, or unspecified chronic kidney disease: Secondary | ICD-10-CM | POA: Diagnosis not present

## 2021-12-07 DIAGNOSIS — N1831 Chronic kidney disease, stage 3a: Secondary | ICD-10-CM

## 2021-12-07 DIAGNOSIS — R2689 Other abnormalities of gait and mobility: Secondary | ICD-10-CM | POA: Diagnosis not present

## 2021-12-07 DIAGNOSIS — N19 Unspecified kidney failure: Secondary | ICD-10-CM | POA: Diagnosis not present

## 2021-12-07 DIAGNOSIS — R531 Weakness: Secondary | ICD-10-CM | POA: Diagnosis not present

## 2021-12-07 DIAGNOSIS — Z7401 Bed confinement status: Secondary | ICD-10-CM | POA: Diagnosis not present

## 2021-12-07 DIAGNOSIS — Z888 Allergy status to other drugs, medicaments and biological substances status: Secondary | ICD-10-CM

## 2021-12-07 DIAGNOSIS — Z7982 Long term (current) use of aspirin: Secondary | ICD-10-CM

## 2021-12-07 DIAGNOSIS — Z79899 Other long term (current) drug therapy: Secondary | ICD-10-CM

## 2021-12-07 DIAGNOSIS — M2041 Other hammer toe(s) (acquired), right foot: Secondary | ICD-10-CM | POA: Diagnosis present

## 2021-12-07 DIAGNOSIS — Z8249 Family history of ischemic heart disease and other diseases of the circulatory system: Secondary | ICD-10-CM

## 2021-12-07 HISTORY — DX: Chronic kidney disease, unspecified: N18.9

## 2021-12-07 HISTORY — DX: Severe sepsis without septic shock: R65.20

## 2021-12-07 HISTORY — DX: Severe sepsis without septic shock: A41.9

## 2021-12-07 LAB — COMPREHENSIVE METABOLIC PANEL
ALT: 12 U/L (ref 0–44)
AST: 14 U/L — ABNORMAL LOW (ref 15–41)
Albumin: 3.5 g/dL (ref 3.5–5.0)
Alkaline Phosphatase: 52 U/L (ref 38–126)
Anion gap: 9 (ref 5–15)
BUN: 69 mg/dL — ABNORMAL HIGH (ref 8–23)
CO2: 21 mmol/L — ABNORMAL LOW (ref 22–32)
Calcium: 9.8 mg/dL (ref 8.9–10.3)
Chloride: 109 mmol/L (ref 98–111)
Creatinine, Ser: 3.65 mg/dL — ABNORMAL HIGH (ref 0.61–1.24)
GFR, Estimated: 16 mL/min — ABNORMAL LOW (ref >60)
Glucose, Bld: 137 mg/dL — ABNORMAL HIGH (ref 70–99)
Potassium: 4.3 mmol/L (ref 3.5–5.1)
Sodium: 139 mmol/L (ref 135–145)
Total Bilirubin: 0.4 mg/dL (ref 0.3–1.2)
Total Protein: 6.6 g/dL (ref 6.5–8.1)

## 2021-12-07 LAB — CBC WITH DIFFERENTIAL/PLATELET
Abs Immature Granulocytes: 0.09 10*3/uL — ABNORMAL HIGH (ref 0.00–0.07)
Basophils Absolute: 0.1 10*3/uL (ref 0.0–0.1)
Basophils Relative: 0 %
Eosinophils Absolute: 0.2 10*3/uL (ref 0.0–0.5)
Eosinophils Relative: 1 %
HCT: 31.2 % — ABNORMAL LOW (ref 39.0–52.0)
Hemoglobin: 9.9 g/dL — ABNORMAL LOW (ref 13.0–17.0)
Immature Granulocytes: 1 %
Lymphocytes Relative: 17 %
Lymphs Abs: 2.3 10*3/uL (ref 0.7–4.0)
MCH: 32.2 pg (ref 26.0–34.0)
MCHC: 31.7 g/dL (ref 30.0–36.0)
MCV: 101.6 fL — ABNORMAL HIGH (ref 80.0–100.0)
Monocytes Absolute: 1.2 10*3/uL — ABNORMAL HIGH (ref 0.1–1.0)
Monocytes Relative: 9 %
Neutro Abs: 9.2 10*3/uL — ABNORMAL HIGH (ref 1.7–7.7)
Neutrophils Relative %: 72 %
Platelets: 173 10*3/uL (ref 150–400)
RBC: 3.07 MIL/uL — ABNORMAL LOW (ref 4.22–5.81)
RDW: 14.5 % (ref 11.5–15.5)
WBC: 12.9 10*3/uL — ABNORMAL HIGH (ref 4.0–10.5)
nRBC: 0 % (ref 0.0–0.2)

## 2021-12-07 LAB — LACTIC ACID, PLASMA: Lactic Acid, Venous: 0.8 mmol/L (ref 0.5–1.9)

## 2021-12-07 LAB — BRAIN NATRIURETIC PEPTIDE: B Natriuretic Peptide: 726.1 pg/mL — ABNORMAL HIGH (ref 0.0–100.0)

## 2021-12-07 LAB — SARS CORONAVIRUS 2 BY RT PCR: SARS Coronavirus 2 by RT PCR: NEGATIVE

## 2021-12-07 MED ORDER — INSULIN ASPART 100 UNIT/ML IJ SOLN
0.0000 [IU] | Freq: Three times a day (TID) | INTRAMUSCULAR | Status: DC
  Administered 2021-12-10: 1 [IU] via SUBCUTANEOUS

## 2021-12-07 MED ORDER — FLUTICASONE PROPIONATE 50 MCG/ACT NA SUSP
1.0000 | Freq: Every day | NASAL | Status: DC
  Administered 2021-12-08 – 2021-12-14 (×7): 1 via NASAL
  Filled 2021-12-07: qty 16

## 2021-12-07 MED ORDER — GUAIFENESIN ER 600 MG PO TB12
1200.0000 mg | ORAL_TABLET | Freq: Two times a day (BID) | ORAL | Status: DC | PRN
Start: 2021-12-07 — End: 2021-12-14

## 2021-12-07 MED ORDER — SODIUM CHLORIDE 0.9 % IV SOLN
500.0000 mg | INTRAVENOUS | Status: AC
  Administered 2021-12-08 – 2021-12-12 (×5): 500 mg via INTRAVENOUS
  Filled 2021-12-07 (×5): qty 5

## 2021-12-07 MED ORDER — ALBUTEROL SULFATE (2.5 MG/3ML) 0.083% IN NEBU: 2.5000 mg | INHALATION_SOLUTION | RESPIRATORY_TRACT | Status: DC | PRN

## 2021-12-07 MED ORDER — IPRATROPIUM-ALBUTEROL 0.5-2.5 (3) MG/3ML IN SOLN
3.0000 mL | Freq: Four times a day (QID) | RESPIRATORY_TRACT | Status: DC
  Administered 2021-12-08 (×4): 3 mL via RESPIRATORY_TRACT
  Filled 2021-12-07 (×4): qty 3

## 2021-12-07 MED ORDER — LACTATED RINGERS IV SOLN: INTRAVENOUS | Status: AC

## 2021-12-07 MED ORDER — ACETAMINOPHEN 650 MG RE SUPP: 650.0000 mg | Freq: Four times a day (QID) | RECTAL | Status: DC | PRN

## 2021-12-07 MED ORDER — AZITHROMYCIN 250 MG PO TABS
500.0000 mg | ORAL_TABLET | Freq: Once | ORAL | Status: AC
  Administered 2021-12-07: 500 mg via ORAL
  Filled 2021-12-07: qty 2

## 2021-12-07 MED ORDER — SODIUM CHLORIDE 0.9 % IV BOLUS
1000.0000 mL | Freq: Once | INTRAVENOUS | Status: AC
  Administered 2021-12-07: 1000 mL via INTRAVENOUS

## 2021-12-07 MED ORDER — SODIUM CHLORIDE 0.9 % IV SOLN
1.0000 g | Freq: Once | INTRAVENOUS | Status: AC
  Administered 2021-12-07: 1 g via INTRAVENOUS
  Filled 2021-12-07: qty 10

## 2021-12-07 MED ORDER — MONTELUKAST SODIUM 10 MG PO TABS
10.0000 mg | ORAL_TABLET | Freq: Every day | ORAL | Status: DC
  Administered 2021-12-08 – 2021-12-13 (×7): 10 mg via ORAL
  Filled 2021-12-07 (×7): qty 1

## 2021-12-07 MED ORDER — ASPIRIN 81 MG PO TBEC
81.0000 mg | DELAYED_RELEASE_TABLET | Freq: Every day | ORAL | Status: DC
  Administered 2021-12-08 – 2021-12-14 (×7): 81 mg via ORAL
  Filled 2021-12-07 (×7): qty 1

## 2021-12-07 MED ORDER — GATIFLOXACIN 0.5 % OP SOLN
1.0000 [drp] | Freq: Two times a day (BID) | OPHTHALMIC | Status: DC
  Administered 2021-12-08 – 2021-12-14 (×13): 1 [drp] via OPHTHALMIC
  Filled 2021-12-07: qty 2.5

## 2021-12-07 MED ORDER — SODIUM CHLORIDE 0.9 % IV SOLN
1.0000 g | INTRAVENOUS | Status: AC
  Administered 2021-12-08 – 2021-12-12 (×5): 1 g via INTRAVENOUS
  Filled 2021-12-07 (×5): qty 10

## 2021-12-07 MED ORDER — ACETAMINOPHEN 325 MG PO TABS
650.0000 mg | ORAL_TABLET | Freq: Four times a day (QID) | ORAL | Status: DC | PRN
  Administered 2021-12-10 – 2021-12-12 (×3): 650 mg via ORAL
  Filled 2021-12-07 (×3): qty 2

## 2021-12-07 NOTE — Assessment & Plan Note (Signed)
  #)   Essential Hypertension: documented h/o such, with outpatient antihypertensive regimen including Lopressor, Norvasc, hydralazine.  SBP's in the ED today: Initially in the 110s, subsequent proving with interval IV fluids, swelling quantified above.  In the setting of presenting severe sepsis, will hold home antihypertensive medications for now.  Plan: Close monitoring of subsequent BP via routine VS. hold home antihypertensive medications for now.

## 2021-12-07 NOTE — Telephone Encounter (Signed)
Patient son, [name] called and stated that patient is feeling worse today. Stated that he is Disoriented, No energy, weak, cough and tired. Not sure if he has fever, no thermometer.   Stated that he has completed the Antibiotics for Pneumonia and still doing the Neb Treatments.   Son stated that his dad told him that he feels bad today and patient never complains, so he knows he is not feeling good.   Requesting to come into office today to see Janett Billow for evaluation but no available appointment.   Please Advise.

## 2021-12-07 NOTE — Assessment & Plan Note (Signed)
  #)   Chronic diastolic heart failure: documented history of such, with most recent echocardiogram performed in 2014 notable for LVEF 60 to 65%, and grade 1 diastolic dysfunction. No clinical or radiographic evidence to suggest acutely decompensated heart failure at this time, including 2 view chest x-ray showing no evidence of edema, effusion. home diuretic regimen reportedly consists of the following: None.  Rather, patient appears clinically mildly dehydrated.  We will provide very gentle IV fluids, closely monitor for ensuing evidence of acute 5 overload.  Plan: monitor strict I's & O's and daily weights. Repeat BMP in AM. Check serum mag level.  Check BNP.

## 2021-12-07 NOTE — Assessment & Plan Note (Signed)
 #)   COPD: Documented history of such in the setting of being a former smoker, with 45-pack-year history of smoking prior to complete discontinuation in the early 1990s.  As an outpatient, appears to be on prn albuterol, but in the absence of any scheduled respiratory regimen.  No overt evidence of acute COPD exacerbation at this time, although he is certainly at risk for ensuing development of such in the context of suspected presenting pneumonia, as above.  Consequently, will emphasize pulmonary toilet, start scheduled duo nebulizer treatments, prn albuterol treatments while pursuing further management of severe sepsis due to pneumonia.  For now, we will refrain from initiation of systemic corticosteroids.  Plan: Scheduled duo nebulizer treatments.  Prn albuterol nebulizer.  Flutter valve, NSAIDs [place].  Check VBG.  Repeat CMP in the morning.  Add on serum magnesium and phosphorus levels.

## 2021-12-07 NOTE — Assessment & Plan Note (Signed)
   #)   Hyperlipidemia: documented h/o such. On high intensity atorvastatin as outpatient.    Plan: continue home statin.      

## 2021-12-07 NOTE — Assessment & Plan Note (Signed)
(  please see severe sepsis) 

## 2021-12-07 NOTE — Telephone Encounter (Signed)
If he is doing worse with disorientation (and has completed antibiotic) he needs to go to the hospital for further evaluation and treatment.  Would recommend going to ED at this time- he can go to [facility] which generally has less of a wait time.

## 2021-12-07 NOTE — ED Notes (Signed)
Called [facility] to transport patient to [facility] rm# [phone]

## 2021-12-07 NOTE — ED Provider Notes (Signed)
[facility] EMERGENCY DEPT Provider Note   CSN: [id number] Arrival date & time: 12/07/21  1430     History {Add pertinent medical, surgical, social history, OB history to HPI:1} Chief Complaint  Patient presents with   Shortness of Breath    Ian [name] is a 86 y.o. male.  He has a history of COPD CKD hypertension diabetes.  Complaining of cough shortness of breath for the last 4 weeks.  Saw PCP about a week and a half ago and put on antibiotics for pneumonia.  Do not recall name.  Continues with cough productive of white sputum and decreased appetite generalized weakness.  Son has noticed the last few days that he seems more generally weak and out of it.  Lives alone and is usually able to care for self.  Patient does not feel any improvement despite antibiotics.  The history is provided by the patient.  Shortness of Breath Severity:  Moderate Onset quality:  Gradual Duration:  4 weeks Timing:  Intermittent Progression:  Unchanged Chronicity:  New Context: activity   Relieved by:  Nothing Worsened by:  Activity Ineffective treatments:  Rest Associated symptoms: cough, sputum production and wheezing   Associated symptoms: no abdominal pain, no chest pain, no fever, no headaches, no hemoptysis, no rash, no sore throat and no syncope       Home Medications Prior to Admission medications   Medication Sig Start Date End Date Taking? Authorizing Provider  acetaminophen (TYLENOL) 500 MG tablet Take 1,000 mg by mouth every 8 (eight) hours as needed.    [provider]  albuterol (VENTOLIN HFA) 108 (90 Base) MCG/ACT inhaler Inhale 2 puffs into the lungs every 6 (six) hours as needed for wheezing or shortness of breath. 06/25/21   Lauree Chandler, NP  amLODipine (NORVASC) 10 MG tablet TAKE 1 TABLET BY MOUTH EVERY DAY 06/26/21   Lauree Chandler, NP  ASPIRIN LOW DOSE 81 MG EC tablet TAKE 1 TABLET BY MOUTH DAILY SWALLOW WHOLE 06/26/21   Lauree Chandler, NP   atorvastatin (LIPITOR) 80 MG tablet TAKE 1 TABLET BY MOUTH EVERY DAY 06/26/21   Lauree Chandler, NP  BESIVANCE 0.6 % SUSP INSTILL ONE DROP INTO RIGHT EYE 4 TIMES A DAY FOR 2 DAYS AFTER EACH MONTHLY EYE INJECTION 02/09/19   [provider]  bisacodyl 5 MG EC tablet Take 1 tablet (5 mg total) by mouth daily as needed for moderate constipation. 01/26/21   Ngetich, Dinah C, NP  blood glucose meter kit and supplies KIT Inject 1 each into the skin 2 (two) times daily. Dispense based on patient and insurance preference. Use up to four times daily as directed. (FOR ICD-9 250.00, 250.01). 08/23/19   Ngetich, Dinah C, NP  diclofenac sodium (VOLTAREN) 1 % GEL Apply 2 g topically 4 (four) times daily as needed (shoulder pain). 01/25/19   [name], [name] L, DO  fluticasone (FLONASE) 50 MCG/ACT nasal spray Place 1 spray into both nostrils daily. 01/23/21   Chesley Mires, MD  glucose blood (ONE TOUCH ULTRA TEST) test strip Use to test blood sugar twice daily.  DX: E11.9 03/13/21   Lauree Chandler, NP  guaiFENesin (MUCINEX) 600 MG 12 hr tablet Take 2 tablets (1,200 mg total) by mouth 2 (two) times daily as needed for cough or to loosen phlegm. 11/27/21   Ngetich, Dinah C, NP  hydrALAZINE (APRESOLINE) 25 MG tablet TAKE 1 TABLET BY MOUTH TWICE A DAY 05/28/21   Lauree Chandler, NP  ipratropium-albuterol (  DUONEB) 0.5-2.5 (3) MG/3ML SOLN Take 3 mLs by nebulization every 6 (six) hours as needed (wheezing). 11/27/21   Ngetich, Nelda Bucks, NP  Lancet Devices (ONE TOUCH DELICA LANCING DEV) MISC Use to test blood sugar once daily dx: E119 02/06/18   [name], [name] L, DO  loratadine (CLARITIN) 10 MG tablet Take 1 tablet (10 mg total) by mouth daily. 07/20/21   Ngetich, Dinah C, NP  metoprolol tartrate (LOPRESSOR) 50 MG tablet TAKE 1 TABLET BY MOUTH TWICE A DAY 08/27/21   Lauree Chandler, NP  montelukast (SINGULAIR) 10 MG tablet TAKE 1 TABLET BY MOUTH EVERYDAY AT BEDTIME 05/28/21   Lauree Chandler, NP  PARoxetine (PAXIL) 40  MG tablet TAKE 1 TABLET BY MOUTH EVERY DAY 10/15/21   Lauree Chandler, NP  sodium chloride (OCEAN) 0.65 % SOLN nasal spray Place 1 spray into both nostrils as needed for congestion.    [provider]  traZODone (DESYREL) 50 MG tablet TAKE 1 TABLET BY MOUTH EVERYDAY AT BEDTIME 11/05/21   Lauree Chandler, NP  trimethoprim-polymyxin b (POLYTRIM) ophthalmic solution INSTILL ONE DROP INTO RIGHT EYE 4 TIMES A DAY FOR 2 DAYS AFTER EACH MONTHLY EYE INJECTION 02/09/19   [provider]  Vitamin D, Ergocalciferol, (DRISDOL) 1.25 MG (50000 UNIT) CAPS capsule TAKE 1 CAPSULE BY MOUTH ONE TIME PER WEEK 01/15/21   Lauree Chandler, NP      Allergies    Ace inhibitors    Review of Systems   Review of Systems  Constitutional:  Negative for fever.  HENT:  Negative for sore throat.   Eyes:  Negative for visual disturbance.  Respiratory:  Positive for cough, sputum production, shortness of breath and wheezing. Negative for hemoptysis.   Cardiovascular:  Negative for chest pain and syncope.  Gastrointestinal:  Negative for abdominal pain.  Genitourinary:  Negative for dysuria.  Skin:  Negative for rash.  Neurological:  Negative for headaches.   Physical Exam Updated Vital Signs BP (!) 152/68   Pulse (!) 54   Temp 98.1 F (36.7 C)   Resp (!) 26   SpO2 91%  Physical Exam Vitals and nursing note reviewed.  Constitutional:      General: He is not in acute distress.    Appearance: He is well-developed.  HENT:     Head: Normocephalic and atraumatic.  Eyes:     Conjunctiva/sclera: Conjunctivae normal.  Cardiovascular:     Rate and Rhythm: Normal rate and regular rhythm.     Heart sounds: No murmur heard. Pulmonary:     Effort: Pulmonary effort is normal. Tachypnea present. No respiratory distress.     Breath sounds: Rhonchi present.  Abdominal:     Palpations: Abdomen is soft.     Tenderness: There is no abdominal tenderness.  Musculoskeletal:        General: No swelling.      Cervical back: Neck supple.     Right lower leg: No tenderness.     Left lower leg: No tenderness.  Skin:    General: Skin is warm and dry.     Capillary Refill: Capillary refill takes less than 2 seconds.  Neurological:     General: No focal deficit present.     Mental Status: He is alert.    ED Results / Procedures / Treatments   Labs (all labs ordered are listed, but only abnormal results are displayed) Labs Reviewed  COMPREHENSIVE METABOLIC PANEL - Abnormal; Notable for the following components:  Result Value   CO2 21 (*)    Glucose, Bld 137 (*)    BUN 69 (*)    Creatinine, Ser 3.65 (*)    AST 14 (*)    GFR, Estimated 16 (*)    All other components within normal limits  CBC WITH DIFFERENTIAL/PLATELET - Abnormal; Notable for the following components:   WBC 12.9 (*)    RBC 3.07 (*)    Hemoglobin 9.9 (*)    HCT 31.2 (*)    MCV 101.6 (*)    Neutro Abs 9.2 (*)    Monocytes Absolute 1.2 (*)    Abs Immature Granulocytes 0.09 (*)    All other components within normal limits  CULTURE, BLOOD (ROUTINE X 2)  CULTURE, BLOOD (ROUTINE X 2)  LACTIC ACID, PLASMA    EKG EKG Interpretation  Date/Time:  Friday December 07 2021 14:44:55 EDT Ventricular Rate:  63 PR Interval:  204 QRS Duration: 104 QT Interval:  446 QTC Calculation: 456 R Axis:   48 Text Interpretation: Normal sinus rhythm with sinus arrhythmia Normal ECG When compared with ECG of 07-Apr-2018 01:11, No significant change since last tracing Confirmed by Aletta Edouard [phone]) on 12/07/2021 4:59:34 PM  Radiology DG Chest 2 View  Result Date: 12/07/2021 CLINICAL DATA:  Coughing up mucus for a couple weeks EXAM: CHEST - 2 VIEW COMPARISON:  Chest radiograph 11/27/2021 FINDINGS: The heart is at the upper limits of normal for size, unchanged. The upper mediastinal contours are normal. The lungs hyperinflated with flattening of the diaphragms suggesting underlying COPD. Patchy retrocardiac opacities are again seen in the  lateral projection which could reflect persistent infection. There is no other focal airspace disease. There is no pleural effusion or pneumothorax. There is no acute osseous abnormality. IMPRESSION: Persistent patchy opacities in the retrocardiac region seen on the lateral projection are suspicious for persistent infection. Recommend follow-up radiographs in 6-8 weeks to assess for resolution. Electronically Signed   By: Valetta Mole M.D.   On: 12/07/2021 15:26    Procedures Procedures  {Document cardiac monitor, telemetry assessment procedure when appropriate:1}  Medications Ordered in ED Medications  sodium chloride 0.9 % bolus 1,000 mL (has no administration in time range)  cefTRIAXone (ROCEPHIN) 1 g in sodium chloride 0.9 % 100 mL IVPB (has no administration in time range)  azithromycin (ZITHROMAX) tablet 500 mg (has no administration in time range)    ED Course/ Medical Decision Making/ A&P                           Medical Decision Making Amount and/or Complexity of Data Reviewed Labs: ordered. Radiology: ordered.  Risk Prescription drug management.  Raad Clayson was evaluated in Emergency Department on 12/07/2021 for the symptoms described in the history of present illness. He was evaluated in the context of the global COVID-19 pandemic, which necessitated consideration that the patient might be at risk for infection with the SARS-CoV-2 virus that causes COVID-19. Institutional protocols and algorithms that pertain to the evaluation of patients at risk for COVID-19 are in a state of rapid change based on information released by regulatory bodies including the CDC and federal and state organizations. These policies and algorithms were followed during the patient's care in the ED.  This patient complains of ***; this involves an extensive number of treatment Options and is a complaint that carries with it a high risk of complications and morbidity. The differential includes ***  I  ordered, reviewed  and interpreted labs, which included *** I ordered medication *** and reviewed PMP when indicated. I ordered imaging studies which included *** and I independently    visualized and interpreted imaging which showed *** Additional history obtained from *** Previous records obtained and reviewed *** I consulted *** and discussed lab and imaging findings and discussed disposition.  Cardiac monitoring reviewed, *** Social determinants considered, *** Critical Interventions: ***  After the interventions stated above, I reevaluated the patient and found *** Admission and further testing considered, ***   {Document critical care time when appropriate:1} {Document review of labs and clinical decision tools ie heart score, Chads2Vasc2 etc:1}  {Document your independent review of radiology images, and any outside records:1} {Document your discussion with family members, caretakers, and with consultants:1} {Document social determinants of health affecting pt's care:1} {Document your decision making why or why not admission, treatments were needed:1} Final Clinical Impression(s) / ED Diagnoses Final diagnoses:  None    Rx / DC Orders ED Discharge Orders     None

## 2021-12-07 NOTE — Telephone Encounter (Signed)
Discussed with the patient's son. No further questions.

## 2021-12-07 NOTE — Assessment & Plan Note (Signed)
  #)   Severe sepsis due to community-acquired pneumonia: In the setting of 10 days of progressive associated shortness of breath, mildly increased work of breathing, with worsening of the symptoms in spite of recently completed course of doxycycline for retrocardiac airspace opacity identified via plain films on 11/27/2021, and repeat two-view chest x-ray performed this evening showing persistence of this retrocardiac airspace opacity.  Concerning for persistence of pneumonia with failure of outpatient antibiotics given the above history, previous radiographic findings, and persistence/progression of the patient's acute respiratory symptoms following course of doxycycline.   SIRS criteria met via leukocytosis, tachypnea. Lactic acid level: 0.8. Of note, given the associated presence of suspected end organ damage in the form of concominant presenting acute kidney injury superimposed on stage IV CKD, criteria are met for pt's sepsis to be considered severe in nature. However, in the absence of lactic acid level that is greater than or equal to 4.0, and in the absence of any associated hypotension refractory to IVF's, there are no indications for administration of a 30 mL/kg IVF bolus at this time.   Additional ED work-up/management notable for: Collection of blood cultures followed by initiation azithromycin and Rocephin, which will be continued for now, with close ensuing clinical monitoring to monitor for need for further expansion of this spectrum of coverage.  Of note, COVID-19 PCR negative when checked in the ED this evening. Will also check UA.    Plan: CBC w/ diff and CMP in AM.  Follow for results of blood cx's x 2. Abx: Continue azithromycin and Rocephin, as above.  Gentle IV fluids overnight.  Add on procalcitonin level.  Check serum magnesium and phosphorus levels.  Incentive spirometry and flutter valve.  Check strep pneumoniae urine antigen.  Add on BNP.  In the context of underlying history of  COPD, also scheduled duo nebulizer treatments and add prn albuterol nebs.  Monitor continuous pulse oximetry.

## 2021-12-07 NOTE — ED Notes (Signed)
Patient's son [name] updated over the phone.

## 2021-12-07 NOTE — Assessment & Plan Note (Signed)
 #)   Acute metabolic encephalopathy: Patient's son is noted the patient to exhibit evidence of 1 to 2 days of confusion and somnolence relative to the patient's baseline mental status which she carries underlying diagnosis of Alzheimer's dementia. Patient's acute encephalopathy appears to be on the basis of physiologic stressors stemming from presenting severe sepsis d/t suspected persistent pneumonia, as further detailed above, with potential additional metabolic contributions from acute kidney injury superimposed on stage IV CKD.  Potential mild pharmacologic factors on a secondary basis in the setting of multiple central acting medications as an outpatient, including Paxil and scheduled trazodone.   No obvious additional contributory underlying infectious process at this time, including negative COVID-19 PCR performed today. Will check UA to further assess.    No overt acute focal neurologic deficits to suggest a contribution from an underlying acute CVA. Seizures are also felt to be less likely.  Particularly in the setting of a documented history of COPD, with increased risk for exacerbation thereof in the context of presenting pneumonia, will check VBG to evaluate for any contribution from hypercapnic encephalopathy.    Plan: Repeat CMP/CBC in the AM. Check magnesium level. check VBG, TSH, MMA.  Hold outpatient trazodone and Paxil for now.  Work-up and management of severe sepsis on the basis of suspected pneumonia, as described above, in addition to further evaluation management of AKI on CKD 4.  Check urinalysis, urinary drug screen, CPK level, ionized calcium level.  Check INR.

## 2021-12-07 NOTE — Assessment & Plan Note (Signed)
 #)   Acute Kidney Injury superimposed on stage IV CKD: In the context of documented history of stage IV CKD associated baseline creatinine range of 2.9-3.0, presenting serum creatinine noted to be 3.65.  Suspect that this is prerenal in nature on the basis of intravascular depletion stemming from severe sepsis due to pneumonia.  No overt pharmacologic contributing factors at this time.  No evidence of hyperkalemia, anion gap metabolic acidosis, or acute volume overload. Will provide gentle IV fluids, while pursuing further evaluation management of presenting severe sepsis due to pneumonia, closely monitor ensuing renal response to these measures.    Plan: monitor strict I's & O's and daily weights. Attempt to avoid nephrotoxic agents. Refrain from NSAIDs. Repeat CMP in the morning. Check serum magnesium level.  Check urinalysis with microscopy.  Add-on random urine sodium and random urine creatinine.  Check CPK level.  Gentle IV fluids in the form of lactated Ringer's at 50 cc/h x 8 hours. if renal function does not improve with the above measures, can consider obtaining a renal US to evaluate for parenchymal abnormality as well as to assess for evidence of post-renal obstructive process.

## 2021-12-07 NOTE — H&P (Incomplete Revision)
History and Physical    PLEASE NOTE THAT DRAGON DICTATION SOFTWARE WAS USED IN THE CONSTRUCTION OF THIS NOTE.   Ian [name] LNL:[id number] DOB: 01/12/1936 DOA: 12/07/2021  PCP: Lauree Chandler, NP  Patient coming from: home   I have personally briefly reviewed patient's old medical records in [place]  Chief Complaint: Altered mental status  HPI: Ian [name] is a 86 y.o. male with medical history significant for Alzheimer's dementia, stage IV CKD with baseline creatinine 2.9-3.0, hypertension, depression, COPD, chronic anemia with baseline hemoglobin 10-12, hyperlipidemia, chronic diastolic heart failure, of who is admitted to [facility] on 12/07/2021 by way of transfer from [facility] emergency department with severe sepsis due to community-acquired pneumonia after presenting from home to [facility] emergency department for further evaluation of altered mental status.  In the setting of altered mental status superimposed on dementia, the following history is provided by the patient's son in addition to the EDP and via chart review.  The patient has been experiencing progressive nonproductive cough with mild shortness of breath over the last 10 to 12 days, prompting outpatient evaluation with two-view chest x-ray on 11/27/2021, which demonstrated evidence of retrocardiac airspace opacity concerning for pneumonia in the absence of any additional cardiopulmonary process.  Consequently, the patient was started on doxycycline, but without any significant improvement in cough/shortness of breath following compliantly completing this course as an outpatient.  No reported recent or preceding trauma.  No report of nausea, vomiting, diarrhea.   In the context of son noting interval worsening of the patient's cough in spite of recently completed regimen of doxycycline, he also noted the patient to be mildly confused over the course the last 1 to 2 days relative to his baseline mental status  with documentation of underlying diagnosis of Alzheimer dementia.  Son also noted patient to be slightly more somnolent over similar timeframe relative to baseline, prompting the patient to be brought to the [facility] emergency department for further evaluation and management thereof.  Medical history notable for COPD in the setting of being a former smoker, having reportedly quit smoking in the early 1990s after 45-pack-year history of such.  No known baseline supplemental oxygen requirements.  He also has a history of chronic diastolic heart failure, with most recent echocardiogram, per chart review, found to have occurred in 2014, which demonstrated LVEF 60 to 65%, no focal wall motion rise, and showed grade 1 diastolic dysfunction.  Not on any scheduled diuretic medications as an outpatient.  He also has a history of stage IV CKD with baseline creatinine 2.9-3.0, with most recent prior serum creatinine data point noted to be 2.95 on 06/25/2021.  Medical history is notable for chronic anemia with baseline hemoglobin 10-12, with most recent prior value noted to be 11.0 on 08/06/2021.    [facility] ED Course:  Vital signs in the ED were notable for the following: Afebrile; heart rate 53-65; initial blood pressure 110/54, which increased to 159/63 following interval IV fluids; respiratory rate 16-26, oxygen saturation on room air noted to be 91 to 92%, prompting initiation of 2 L nasal cannula, upon which ensuing O2 sats noted to be 95 to 96%.  Labs were notable for the following: CMP notable for the following: Sodium 139, bicarbonate 21, creatinine 3.65, glucose 137, calcium, corrected for mild hypoalbuminemia noted to be 10.2, albumin 3.5, otherwise, liver enzymes within normal limits.  CBC notable for white blood cell count 12,900 with 72% neutrophils, hemoglobin 9.9 associated with macrocytic finding.  Lactic  acid 0.8.  Blood cultures collected prior to initiation of IV antibiotics.  COVID-19 PCR  negative.  Imaging and additional notable ED work-up: EKG shows sinus rhythm with sinus arrhythmia, rate 63, normal intervals, noting borderline prolonged PR of 204, will demonstrating no evidence of T wave or ST changes, including no evidence of ST elevation.  2 view chest x-ray, in comparison to two-view chest x-ray from 11/27/2021 showed persistence of retrocardiac airspace opacity concerning for persistent pneumonia in the absence of any evidence of edema, effusion, or pneumothorax.  While in the ED, the following were administered: Azithromycin, Rocephin, 1 L normal saline bolus.  Subsequently, the patient was transferred to med telemetry at [facility] for further evaluation / management of severe sepsis due to CAP in setting of suspected failure of outpatient antibiotics, with presentation also notable for acute kidney injury superimposed on stage IV CKD as well as suspected acute metabolic encephalopathy. .    Review of Systems: As per HPI otherwise 10 point review of systems negative.   Past Medical History:  Diagnosis Date   Allergy    Alzheimer's disease ([place])    Anemia, unspecified    Anxiety    Chronic maxillary sinusitis    CKD (chronic kidney disease)    3b/4   COPD (chronic obstructive pulmonary disease) (HCC)    Depression    Diabetes mellitus    Edema    Hypercalcemia    Hyperlipidemia    Hypertension    Hypertrophy of prostate without urinary obstruction and other lower urinary tract symptoms (LUTS)    Hypertrophy of prostate without urinary obstruction and other lower urinary tract symptoms (LUTS)    Kidney disease    mild   Unspecified disorder of kidney and ureter    Unspecified sleep apnea    Ventral hernia, unspecified, without mention of obstruction or gangrene     Past Surgical History:  Procedure Laterality Date   EXPLORATORY LAPAROTOMY  2002   HERNIA REPAIR      Social History:  reports that he quit smoking about 33 years ago. His smoking use  included cigarettes. He has a 45.00 pack-year smoking history. He has been exposed to tobacco smoke. He has never used smokeless tobacco. He reports that he does not drink alcohol and does not use drugs.   Allergies  Allergen Reactions   Ace Inhibitors Cough    Family History  Problem Relation Age of Onset   Pancreatic cancer Sister    Heart disease Father    Hypertension Brother    Hypertension Sister     Family history reviewed and not pertinent    Prior to Admission medications   Medication Sig Start Date End Date Taking? Authorizing Provider  acetaminophen (TYLENOL) 500 MG tablet Take 500 mg by mouth every 8 (eight) hours as needed for moderate pain.   Yes [provider]  albuterol (VENTOLIN HFA) 108 (90 Base) MCG/ACT inhaler Inhale 2 puffs into the lungs every 6 (six) hours as needed for wheezing or shortness of breath. 06/25/21  Yes Lauree Chandler, NP  amLODipine (NORVASC) 10 MG tablet TAKE 1 TABLET BY MOUTH EVERY DAY Patient taking differently: Take 10 mg by mouth daily. 06/26/21  Yes Eubanks, Carlos American, NP  ASPIRIN LOW DOSE 81 MG EC tablet TAKE 1 TABLET BY MOUTH DAILY SWALLOW WHOLE Patient taking differently: 81 mg daily. 06/26/21  Yes Lauree Chandler, NP  atorvastatin (LIPITOR) 80 MG tablet TAKE 1 TABLET BY MOUTH EVERY DAY Patient taking differently:  Take 80 mg by mouth daily. 06/26/21  Yes Lauree Chandler, NP  hydrALAZINE (APRESOLINE) 25 MG tablet TAKE 1 TABLET BY MOUTH TWICE A DAY Patient taking differently: Take 25 mg by mouth 2 (two) times daily. 05/28/21  Yes Lauree Chandler, NP  metoprolol tartrate (LOPRESSOR) 50 MG tablet TAKE 1 TABLET BY MOUTH TWICE A DAY Patient taking differently: Take 50 mg by mouth 2 (two) times daily. 08/27/21  Yes Lauree Chandler, NP  montelukast (SINGULAIR) 10 MG tablet TAKE 1 TABLET BY MOUTH EVERYDAY AT BEDTIME Patient taking differently: Take 10 mg by mouth at bedtime. 05/28/21  Yes Lauree Chandler, NP  Multiple  Vitamins-Minerals (PRESERVISION AREDS PO) Take 1 tablet by mouth 2 (two) times daily.   Yes [provider]  traZODone (DESYREL) 50 MG tablet TAKE 1 TABLET BY MOUTH EVERYDAY AT BEDTIME Patient taking differently: Take 50 mg by mouth at bedtime. 11/05/21  Yes Eubanks, Carlos American, NP  BESIVANCE 0.6 % SUSP INSTILL ONE DROP INTO RIGHT EYE 4 TIMES A DAY FOR 2 DAYS AFTER EACH MONTHLY EYE INJECTION 02/09/19   [provider]  bisacodyl 5 MG EC tablet Take 1 tablet (5 mg total) by mouth daily as needed for moderate constipation. 01/26/21   Ngetich, Dinah C, NP  blood glucose meter kit and supplies KIT Inject 1 each into the skin 2 (two) times daily. Dispense based on patient and insurance preference. Use up to four times daily as directed. (FOR ICD-9 250.00, 250.01). 08/23/19   Ngetich, Dinah C, NP  diclofenac sodium (VOLTAREN) 1 % GEL Apply 2 g topically 4 (four) times daily as needed (shoulder pain). 01/25/19   [name], [name] L, DO  fluticasone (FLONASE) 50 MCG/ACT nasal spray Place 1 spray into both nostrils daily. 01/23/21   Chesley Mires, MD  glucose blood (ONE TOUCH ULTRA TEST) test strip Use to test blood sugar twice daily.  DX: E11.9 03/13/21   Lauree Chandler, NP  guaiFENesin (MUCINEX) 600 MG 12 hr tablet Take 2 tablets (1,200 mg total) by mouth 2 (two) times daily as needed for cough or to loosen phlegm. 11/27/21   Ngetich, Dinah C, NP  ipratropium-albuterol (DUONEB) 0.5-2.5 (3) MG/3ML SOLN Take 3 mLs by nebulization every 6 (six) hours as needed (wheezing). 11/27/21   Ngetich, Nelda Bucks, NP  Lancet Devices (ONE TOUCH DELICA LANCING DEV) MISC Use to test blood sugar once daily dx: E119 02/06/18   [name], [name] L, DO  loratadine (CLARITIN) 10 MG tablet Take 1 tablet (10 mg total) by mouth daily. 07/20/21   Ngetich, Dinah C, NP  PARoxetine (PAXIL) 40 MG tablet TAKE 1 TABLET BY MOUTH EVERY DAY 10/15/21   Lauree Chandler, NP  sodium chloride (OCEAN) 0.65 % SOLN nasal spray Place 1 spray into both  nostrils as needed for congestion.    [provider]  SYMBICORT 160-4.5 MCG/ACT inhaler Inhale into the lungs. 12/06/21   [provider]  trimethoprim-polymyxin b (POLYTRIM) ophthalmic solution INSTILL ONE DROP INTO RIGHT EYE 4 TIMES A DAY FOR 2 DAYS AFTER EACH MONTHLY EYE INJECTION 02/09/19   [provider]  Vitamin D, Ergocalciferol, (DRISDOL) 1.25 MG (50000 UNIT) CAPS capsule TAKE 1 CAPSULE BY MOUTH ONE TIME PER WEEK 01/15/21   Lauree Chandler, NP     Objective    Physical Exam: Vitals:   12/07/21 1800 12/07/21 1900 12/07/21 1938 12/07/21 2218  BP: (!) 159/63 (!) 145/59 (!) 145/59 (!) 170/58  Pulse: (!) 55 (!) 55 (!)  57 65  Resp: (!) 25 (!) 26 (!) 24 16  Temp:   98 F (36.7 C) 97.8 F (36.6 C)  TempSrc:   Oral Oral  SpO2: 92% 90% 95% 95%    General: appears to be stated age; alert, confused; mildly increased work of breathing noted.  Skin: warm, dry, no rash Head:  AT/[place] Mouth:  Oral mucosa membranes appear dry, normal dentition Neck: supple; trachea midline Heart:  RRR; did not appreciate any M/R/G Lungs: CTAB, did not appreciate any wheezes, rales, or rhonchi Abdomen: + BS; soft, ND, NT Vascular: 2+ pedal pulses b/l; 2+ radial pulses b/l Extremities: no peripheral edema, no muscle wasting Neuro: strength and sensation intact in upper and lower extremities b/l   Labs on Admission: I have personally reviewed following labs and imaging studies  CBC: Recent Labs  Lab 12/07/21 1504  WBC 12.9*  NEUTROABS 9.2*  HGB 9.9*  HCT 31.2*  MCV 101.6*  PLT 672   Basic Metabolic Panel: Recent Labs  Lab 12/07/21 1504  NA 139  K 4.3  CL 109  CO2 21*  GLUCOSE 137*  BUN 69*  CREATININE 3.65*  CALCIUM 9.8   GFR: Estimated Creatinine Clearance: 14.3 mL/min (A) (by C-G formula based on SCr of 3.65 mg/dL (H)). Liver Function Tests: Recent Labs  Lab 12/07/21 1504  AST 14*  ALT 12  ALKPHOS 52  BILITOT 0.4  PROT 6.6  ALBUMIN 3.5   No  results for input(s): LIPASE, AMYLASE in the last 168 hours. No results for input(s): AMMONIA in the last 168 hours. Coagulation Profile: No results for input(s): INR, PROTIME in the last 168 hours. Cardiac Enzymes: No results for input(s): CKTOTAL, CKMB, CKMBINDEX, TROPONINI in the last 168 hours. BNP (last 3 results) No results for input(s): PROBNP in the last 8760 hours. HbA1C: No results for input(s): HGBA1C in the last 72 hours. CBG: No results for input(s): GLUCAP in the last 168 hours. Lipid Profile: No results for input(s): CHOL, HDL, LDLCALC, TRIG, CHOLHDL, LDLDIRECT in the last 72 hours. Thyroid Function Tests: No results for input(s): TSH, T4TOTAL, FREET4, T3FREE, THYROIDAB in the last 72 hours. Anemia Panel: No results for input(s): VITAMINB12, FOLATE, FERRITIN, TIBC, IRON, RETICCTPCT in the last 72 hours. Urine analysis:    Component Value Date/Time   COLORURINE YELLOW 12/29/2012 [place] 12/29/2012 1650   LABSPEC 1.025 12/29/2012 1650   PHURINE 5.5 12/29/2012 1650   GLUCOSEU NEGATIVE 12/29/2012 1650   HGBUR MODERATE (A) 12/29/2012 1650   BILIRUBINUR SMALL (A) 12/29/2012 1650   KETONESUR NEGATIVE 12/29/2012 1650   PROTEINUR >300 (A) 12/29/2012 1650   UROBILINOGEN 1.0 12/29/2012 1650   NITRITE NEGATIVE 12/29/2012 1650   LEUKOCYTESUR NEGATIVE 12/29/2012 1650    Radiological Exams on Admission: DG Chest 2 View  Result Date: 12/07/2021 CLINICAL DATA:  Coughing up mucus for a couple weeks EXAM: CHEST - 2 VIEW COMPARISON:  Chest radiograph 11/27/2021 FINDINGS: The heart is at the upper limits of normal for size, unchanged. The upper mediastinal contours are normal. The lungs hyperinflated with flattening of the diaphragms suggesting underlying COPD. Patchy retrocardiac opacities are again seen in the lateral projection which could reflect persistent infection. There is no other focal airspace disease. There is no pleural effusion or pneumothorax. There is  no acute osseous abnormality. IMPRESSION: Persistent patchy opacities in the retrocardiac region seen on the lateral projection are suspicious for persistent infection. Recommend follow-up radiographs in 6-8 weeks to assess for resolution. Electronically Signed  By: Valetta Mole M.D.   On: 12/07/2021 15:26     EKG: Independently reviewed, with result as described above.    Assessment/Plan    Principal Problem:   Severe sepsis (HCC) Active Problems:   Allergic rhinitis   COPD (chronic obstructive pulmonary disease) (HCC)   Hyperlipidemia   DM2 (diabetes mellitus, type 2) (HCC)   Chronic diastolic CHF (congestive heart failure) (HCC)   HTN (hypertension)   Depression with anxiety   CAP (community acquired pneumonia)   Acute renal failure superimposed on stage 4 chronic kidney disease (HCC)   Acute metabolic encephalopathy   Anemia, unspecified        #) Severe sepsis due to community-acquired pneumonia: In the setting of 10 days of progressive associated shortness of breath, mildly increased work of breathing, with worsening of the symptoms in spite of recently completed course of doxycycline for retrocardiac airspace opacity identified via plain films on 11/27/2021, and repeat two-view chest x-ray performed this evening showing persistence of this retrocardiac airspace opacity.  Concerning for persistence of pneumonia with failure of outpatient antibiotics given the above history, previous radiographic findings, and persistence/progression of the patient's acute respiratory symptoms following course of doxycycline.   SIRS criteria met via leukocytosis, tachypnea. Lactic acid level: 0.8. Of note, given the associated presence of suspected end organ damage in the form of concominant presenting acute kidney injury superimposed on stage IV CKD, criteria are met for pt's sepsis to be considered severe in nature. However, in the absence of lactic acid level that is greater than or equal to  4.0, and in the absence of any associated hypotension refractory to IVF's, there are no indications for administration of a 30 mL/kg IVF bolus at this time.   Additional ED work-up/management notable for: Collection of blood cultures followed by initiation azithromycin and Rocephin, which will be continued for now, with close ensuing clinical monitoring to monitor for need for further expansion of this spectrum of coverage.  Of note, COVID-19 PCR negative when checked in the ED this evening. Will also check UA.    Plan: CBC w/ diff and CMP in AM.  Follow for results of blood cx's x 2. Abx: Continue azithromycin and Rocephin, as above.  Gentle IV fluids overnight.  Add on procalcitonin level.  Check serum magnesium and phosphorus levels.  Incentive spirometry and flutter valve.  Check strep pneumoniae urine antigen.  Add on BNP.  In the context of underlying history of COPD, also scheduled duo nebulizer treatments and add prn albuterol nebs.  Monitor continuous pulse oximetry.         #) Acute Kidney Injury superimposed on stage IV CKD: In the context of documented history of stage IV CKD associated baseline creatinine range of 2.9-3.0, presenting serum creatinine noted to be 3.65.  Suspect that this is prerenal in nature on the basis of intravascular depletion stemming from severe sepsis due to pneumonia.  No overt pharmacologic contributing factors at this time.  No evidence of hyperkalemia, anion gap metabolic acidosis, or acute volume overload. Will provide gentle IV fluids, while pursuing further evaluation management of presenting severe sepsis due to pneumonia, closely monitor ensuing renal response to these measures.    Plan: monitor strict I's & O's and daily weights. Attempt to avoid nephrotoxic agents. Refrain from NSAIDs. Repeat CMP in the morning. Check serum magnesium level.  Check urinalysis with microscopy.  Add-on random urine sodium and random urine creatinine.  Check CPK level.   Gentle IV fluids in  the form of lactated Ringer's at 50 cc/h x 8 hours. if renal function does not improve with the above measures, can consider obtaining a renal US to evaluate for parenchymal abnormality as well as to assess for evidence of post-renal obstructive process.          #) Acute metabolic encephalopathy: Patient's son is noted the patient to exhibit evidence of 1 to 2 days of confusion and somnolence relative to the patient's baseline mental status which she carries underlying diagnosis of Alzheimer's dementia. Patient's acute encephalopathy appears to be on the basis of physiologic stressors stemming from presenting severe sepsis d/t suspected persistent pneumonia, as further detailed above, with potential additional metabolic contributions from acute kidney injury superimposed on stage IV CKD.  Potential mild pharmacologic factors on a secondary basis in the setting of multiple central acting medications as an outpatient, including Paxil and scheduled trazodone.   No obvious additional contributory underlying infectious process at this time, including negative COVID-19 PCR performed today. Will check UA to further assess.    No overt acute focal neurologic deficits to suggest a contribution from an underlying acute CVA. Seizures are also felt to be less likely.  Particularly in the setting of a documented history of COPD, with increased risk for exacerbation thereof in the context of presenting pneumonia, will check VBG to evaluate for any contribution from hypercapnic encephalopathy.    Plan: Repeat CMP/CBC in the AM. Check magnesium level. check VBG, TSH, MMA.  Hold outpatient trazodone and Paxil for now.  Work-up and management of severe sepsis on the basis of suspected pneumonia, as described above, in addition to further evaluation management of AKI on CKD 4.  Check urinalysis, urinary drug screen, CPK level, ionized calcium level.  Check INR.           #) Essential  Hypertension: documented h/o such, with outpatient antihypertensive regimen including Lopressor, Norvasc, hydralazine.  SBP's in the ED today: Initially in the 110s, subsequent proving with interval IV fluids, swelling quantified above.  In the setting of presenting severe sepsis, will hold home antihypertensive medications for now.  Plan: Close monitoring of subsequent BP via routine VS. hold home antihypertensive medications for now.          #) Depression: Documented history of such, on Paxil as an outpatient.  In the setting of presenting acute encephalopathy, will hold this of central acting medication for now, as further detailed above.  Plan: Hold on Paxil for now.        #) COPD: Documented history of such in the setting of being a former smoker, with 45-pack-year history of smoking prior to complete discontinuation in the early 1990s.  As an outpatient, appears to be on prn albuterol, but in the absence of any scheduled respiratory regimen.  No overt evidence of acute COPD exacerbation at this time, although he is certainly at risk for ensuing development of such in the context of suspected presenting pneumonia, as above.  Consequently, will emphasize pulmonary toilet, start scheduled duo nebulizer treatments, prn albuterol treatments while pursuing further management of severe sepsis due to pneumonia.  For now, we will refrain from initiation of systemic corticosteroids.  Plan: Scheduled duo nebulizer treatments.  Prn albuterol nebulizer.  Flutter valve, NSAIDs [place].  Check VBG.  Repeat CMP in the morning.  Add on serum magnesium and phosphorus levels.        #) Chronic anemia: Documented history of such, associated baseline hemoglobin range of 10-12, with presenting hemoglobin consistent  with his baseline range, in the absence of any overt evidence of active bleed.  Likely multifactorial in its etiology, with contributions from anemia of chronic disease/anemia of chronic  kidney disease, although presenting CBC reflects mild macrocytic findings.   Plan: Check INR, MMA level.  Repeat CBC in the morning.          #) Type 2 Diabetes Mellitus: documented history of such, which appears to be managed via lifestyle modifications in the absence of any outpatient insulin or oral hypoglycemic regimen after most recent hemoglobin A1c in December 2022 was noted to be 5.8%.  Presenting blood sugar this evening 137.  In the context of increased risk for relative hypoglycemia in the context of physiologic stress stemming from presenting severe sepsis, will monitor blood sugars (hospitalization, and provide very low-dose sliding scale  Plan: accuchecks QAC and HS with very low dose SSI.            #) Hyperlipidemia: documented h/o such. On high intensity atorvastatin as outpatient.    Plan: continue home statin.           #) Allergic Rhinitis: documented h/o such, on scheduled intranasal Flonase as outpatient, in addition to scheduled Claritin.    Plan: cont home Flonase and Claritin.             #) Chronic diastolic heart failure: documented history of such, with most recent echocardiogram performed in 2014 notable for LVEF 60 to 65%, and grade 1 diastolic dysfunction. No clinical or radiographic evidence to suggest acutely decompensated heart failure at this time, including 2 view chest x-ray showing no evidence of edema, effusion. home diuretic regimen reportedly consists of the following: None.  Rather, patient appears clinically mildly dehydrated.  We will provide very gentle IV fluids, closely monitor for ensuing evidence of acute 5 overload.  Plan: monitor strict I's & O's and daily weights. Repeat BMP in AM. Check serum mag level.  Check BNP.      DVT prophylaxis: SCD's   Code Status: DNR (consistent with code status during most recent prior hospitalizations) Disposition Plan: Per Rounding Team Consults called: none;  Admission  status: Inpatient   PLEASE NOTE THAT DRAGON DICTATION SOFTWARE WAS USED IN THE CONSTRUCTION OF THIS NOTE.   [place] DO [facility] [place]   12/07/2021, 10:53 PM

## 2021-12-07 NOTE — Assessment & Plan Note (Signed)
 #)   Depression: Documented history of such, on Paxil as an outpatient.  In the setting of presenting acute encephalopathy, will hold this of central acting medication for now, as further detailed above.  Plan: Hold on Paxil for now.

## 2021-12-07 NOTE — ED Notes (Signed)
[facility] at bedside for transport. 

## 2021-12-07 NOTE — Assessment & Plan Note (Signed)
 #)   Chronic anemia: Documented history of such, associated baseline hemoglobin range of 10-12, with presenting hemoglobin consistent with his baseline range, in the absence of any overt evidence of active bleed.  Likely multifactorial in its etiology, with contributions from anemia of chronic disease/anemia of chronic kidney disease, although presenting CBC reflects mild macrocytic findings.   Plan: Check INR, MMA level.  Repeat CBC in the morning.

## 2021-12-07 NOTE — Assessment & Plan Note (Signed)
 #)   Type 2 Diabetes Mellitus: documented history of such, which appears to be managed via lifestyle modifications in the absence of any outpatient insulin or oral hypoglycemic regimen after most recent hemoglobin A1c in December 2022 was noted to be 5.8%.  Presenting blood sugar this evening 137.  In the context of increased risk for relative hypoglycemia in the context of physiologic stress stemming from presenting severe sepsis, will monitor blood sugars (hospitalization, and provide very low-dose sliding scale  Plan: accuchecks QAC and HS with very low dose SSI.

## 2021-12-07 NOTE — ED Triage Notes (Signed)
Patient reports to the ER for shortness of breath and fatigue and confusion. Patient reports he had a diagnosis of pneumonia a week and a half ago and has not been getting better despite antibiotics.

## 2021-12-07 NOTE — Telephone Encounter (Signed)
I agree with Janett Billow.

## 2021-12-07 NOTE — ED Notes (Signed)
Telephone update given to pt's son, [name].

## 2021-12-07 NOTE — Assessment & Plan Note (Signed)
  #)   Allergic Rhinitis: documented h/o such, on scheduled intranasal Flonase as outpatient, in addition to scheduled Claritin.    Plan: cont home Flonase and Claritin.

## 2021-12-07 NOTE — Telephone Encounter (Signed)
LM on Mike's Voicemail to return call.

## 2021-12-07 NOTE — H&P (Signed)
History and Physical    PLEASE NOTE THAT DRAGON DICTATION SOFTWARE WAS USED IN THE CONSTRUCTION OF THIS NOTE.   Ian [name] SFS:[id number] DOB: 07-18-1935 DOA: 12/07/2021  PCP: Lauree Chandler, NP  Patient coming from: home   I have personally briefly reviewed patient's old medical records in [place]  Chief Complaint: Altered mental status  HPI: Ian [name] is a 86 y.o. male with medical history significant for Alzheimer's dementia, stage IV CKD with baseline creatinine 2.9-3.0, hypertension, depression, COPD, chronic anemia with baseline hemoglobin 10-12, hyperlipidemia, chronic diastolic heart failure, of who is admitted to [facility] on 12/07/2021 by way of transfer from [facility] emergency department with severe sepsis due to community-acquired pneumonia after presenting from home to [facility] emergency department for further evaluation of altered mental status.  In the setting of altered mental status superimposed on dementia, the following history is provided by the patient's son in addition to the EDP and via chart review.  The patient has been experiencing progressive nonproductive cough with mild shortness of breath over the last 10 to 12 days, prompting outpatient evaluation with two-view chest x-ray on 11/27/2021, which demonstrated evidence of retrocardiac airspace opacity concerning for pneumonia in the absence of any additional cardiopulmonary process.  Consequently, the patient was started on doxycycline, but without any significant improvement in cough/shortness of breath following compliantly completing this course as an outpatient.  No reported recent or preceding trauma.  No report of nausea, vomiting, diarrhea.   In the context of son noting interval worsening of the patient's cough in spite of recently completed regimen of doxycycline, he also noted the patient to be mildly confused over the course the last 1 to 2 days relative to his baseline mental status  with documentation of underlying diagnosis of Alzheimer dementia.  Son also noted patient to be slightly more somnolent over similar timeframe relative to baseline, prompting the patient to be brought to the [facility] emergency department for further evaluation and management thereof.  Medical history notable for COPD in the setting of being a former smoker, having reportedly quit smoking in the early 1990s after 45-pack-year history of such.  No known baseline supplemental oxygen requirements.  He also has a history of chronic diastolic heart failure, with most recent echocardiogram, per chart review, found to have occurred in 2014, which demonstrated LVEF 60 to 65%, no focal wall motion rise, and showed grade 1 diastolic dysfunction.  Not on any scheduled diuretic medications as an outpatient.  He also has a history of stage IV CKD with baseline creatinine 2.9-3.0, with most recent prior serum creatinine data point noted to be 2.95 on 06/25/2021.  Medical history is notable for chronic anemia with baseline hemoglobin 10-12, with most recent prior value noted to be 11.0 on 08/06/2021.    [facility] ED Course:  Vital signs in the ED were notable for the following: Afebrile; heart rate 53-65; initial blood pressure 110/54, which increased to 159/63 following interval IV fluids; respiratory rate 16-26, oxygen saturation on room air noted to be 91 to 92%, prompting initiation of 2 L nasal cannula, upon which ensuing O2 sats noted to be 95 to 96%.  Labs were notable for the following: CMP notable for the following: Sodium 139, bicarbonate 21, creatinine 3.65, glucose 137, calcium, corrected for mild hypoalbuminemia noted to be 10.2, albumin 3.5, otherwise, liver enzymes within normal limits.  CBC notable for white blood cell count 12,900 with 72% neutrophils, hemoglobin 9.9 associated with macrocytic finding.  Lactic  acid 0.8.  Blood cultures collected prior to initiation of IV antibiotics.  COVID-19 PCR  negative.  Imaging and additional notable ED work-up: EKG shows sinus rhythm with sinus arrhythmia, rate 63, normal intervals, noting borderline prolonged PR of 204, will demonstrating no evidence of T wave or ST changes, including no evidence of ST elevation.  2 view chest x-ray, in comparison to two-view chest x-ray from 11/27/2021 showed persistence of retrocardiac airspace opacity concerning for persistent pneumonia in the absence of any evidence of edema, effusion, or pneumothorax.  While in the ED, the following were administered: Azithromycin, Rocephin, 1 L normal saline bolus.  Subsequently, the patient was transferred to med telemetry at [facility] for further evaluation management of severe sepsis due to suspected failure of outpatient biotics, with presentation also notable for acute kidney injury superimposed on stage IV CKD as well as suspected acute metabolic encephalopathy.    Review of Systems: As per HPI otherwise 10 point review of systems negative.   Past Medical History:  Diagnosis Date   Allergy    Alzheimer's disease ([place])    Anemia, unspecified    Anxiety    Chronic maxillary sinusitis    CKD (chronic kidney disease)    3b/4   COPD (chronic obstructive pulmonary disease) (HCC)    Depression    Diabetes mellitus    Edema    Hypercalcemia    Hyperlipidemia    Hypertension    Hypertrophy of prostate without urinary obstruction and other lower urinary tract symptoms (LUTS)    Hypertrophy of prostate without urinary obstruction and other lower urinary tract symptoms (LUTS)    Kidney disease    mild   Unspecified disorder of kidney and ureter    Unspecified sleep apnea    Ventral hernia, unspecified, without mention of obstruction or gangrene     Past Surgical History:  Procedure Laterality Date   EXPLORATORY LAPAROTOMY  2002   HERNIA REPAIR      Social History:  reports that he quit smoking about 33 years ago. His smoking use included cigarettes. He has a  45.00 pack-year smoking history. He has been exposed to tobacco smoke. He has never used smokeless tobacco. He reports that he does not drink alcohol and does not use drugs.   Allergies  Allergen Reactions   Ace Inhibitors Cough    Family History  Problem Relation Age of Onset   Pancreatic cancer Sister    Heart disease Father    Hypertension Brother    Hypertension Sister     Family history reviewed and not pertinent    Prior to Admission medications   Medication Sig Start Date End Date Taking? Authorizing Provider  acetaminophen (TYLENOL) 500 MG tablet Take 500 mg by mouth every 8 (eight) hours as needed for moderate pain.   Yes [provider]  albuterol (VENTOLIN HFA) 108 (90 Base) MCG/ACT inhaler Inhale 2 puffs into the lungs every 6 (six) hours as needed for wheezing or shortness of breath. 06/25/21  Yes Lauree Chandler, NP  amLODipine (NORVASC) 10 MG tablet TAKE 1 TABLET BY MOUTH EVERY DAY Patient taking differently: Take 10 mg by mouth daily. 06/26/21  Yes Eubanks, Carlos American, NP  ASPIRIN LOW DOSE 81 MG EC tablet TAKE 1 TABLET BY MOUTH DAILY SWALLOW WHOLE Patient taking differently: 81 mg daily. 06/26/21  Yes Lauree Chandler, NP  atorvastatin (LIPITOR) 80 MG tablet TAKE 1 TABLET BY MOUTH EVERY DAY Patient taking differently: Take 80 mg by mouth daily.  06/26/21  Yes Lauree Chandler, NP  hydrALAZINE (APRESOLINE) 25 MG tablet TAKE 1 TABLET BY MOUTH TWICE A DAY Patient taking differently: Take 25 mg by mouth 2 (two) times daily. 05/28/21  Yes Lauree Chandler, NP  metoprolol tartrate (LOPRESSOR) 50 MG tablet TAKE 1 TABLET BY MOUTH TWICE A DAY Patient taking differently: Take 50 mg by mouth 2 (two) times daily. 08/27/21  Yes Lauree Chandler, NP  montelukast (SINGULAIR) 10 MG tablet TAKE 1 TABLET BY MOUTH EVERYDAY AT BEDTIME Patient taking differently: Take 10 mg by mouth at bedtime. 05/28/21  Yes Lauree Chandler, NP  Multiple Vitamins-Minerals  (PRESERVISION AREDS PO) Take 1 tablet by mouth 2 (two) times daily.   Yes [provider]  traZODone (DESYREL) 50 MG tablet TAKE 1 TABLET BY MOUTH EVERYDAY AT BEDTIME Patient taking differently: Take 50 mg by mouth at bedtime. 11/05/21  Yes Eubanks, Carlos American, NP  BESIVANCE 0.6 % SUSP INSTILL ONE DROP INTO RIGHT EYE 4 TIMES A DAY FOR 2 DAYS AFTER EACH MONTHLY EYE INJECTION 02/09/19   [provider]  bisacodyl 5 MG EC tablet Take 1 tablet (5 mg total) by mouth daily as needed for moderate constipation. 01/26/21   Ngetich, Dinah C, NP  blood glucose meter kit and supplies KIT Inject 1 each into the skin 2 (two) times daily. Dispense based on patient and insurance preference. Use up to four times daily as directed. (FOR ICD-9 250.00, 250.01). 08/23/19   Ngetich, Dinah C, NP  diclofenac sodium (VOLTAREN) 1 % GEL Apply 2 g topically 4 (four) times daily as needed (shoulder pain). 01/25/19   [name], [name] L, DO  fluticasone (FLONASE) 50 MCG/ACT nasal spray Place 1 spray into both nostrils daily. 01/23/21   Chesley Mires, MD  glucose blood (ONE TOUCH ULTRA TEST) test strip Use to test blood sugar twice daily.  DX: E11.9 03/13/21   Lauree Chandler, NP  guaiFENesin (MUCINEX) 600 MG 12 hr tablet Take 2 tablets (1,200 mg total) by mouth 2 (two) times daily as needed for cough or to loosen phlegm. 11/27/21   Ngetich, Dinah C, NP  ipratropium-albuterol (DUONEB) 0.5-2.5 (3) MG/3ML SOLN Take 3 mLs by nebulization every 6 (six) hours as needed (wheezing). 11/27/21   Ngetich, Nelda Bucks, NP  Lancet Devices (ONE TOUCH DELICA LANCING DEV) MISC Use to test blood sugar once daily dx: E119 02/06/18   [name], [name] L, DO  loratadine (CLARITIN) 10 MG tablet Take 1 tablet (10 mg total) by mouth daily. 07/20/21   Ngetich, Dinah C, NP  PARoxetine (PAXIL) 40 MG tablet TAKE 1 TABLET BY MOUTH EVERY DAY 10/15/21   Lauree Chandler, NP  sodium chloride (OCEAN) 0.65 % SOLN nasal spray Place 1 spray into both nostrils as needed for  congestion.    [provider]  SYMBICORT 160-4.5 MCG/ACT inhaler Inhale into the lungs. 12/06/21   [provider]  trimethoprim-polymyxin b (POLYTRIM) ophthalmic solution INSTILL ONE DROP INTO RIGHT EYE 4 TIMES A DAY FOR 2 DAYS AFTER EACH MONTHLY EYE INJECTION 02/09/19   [provider]  Vitamin D, Ergocalciferol, (DRISDOL) 1.25 MG (50000 UNIT) CAPS capsule TAKE 1 CAPSULE BY MOUTH ONE TIME PER WEEK 01/15/21   Lauree Chandler, NP     Objective    Physical Exam: Vitals:   12/07/21 1800 12/07/21 1900 12/07/21 1938 12/07/21 2218  BP: (!) 159/63 (!) 145/59 (!) 145/59 (!) 170/58  Pulse: (!) 55 (!) 55 (!) 57 65  Resp: (!) 25 (!)  26 (!) 24 16  Temp:   98 F (36.7 C) 97.8 F (36.6 C)  TempSrc:   Oral Oral  SpO2: 92% 90% 95% 95%    General: appears to be stated age; alert, confused; mildly increased work of breathing noted.  Skin: warm, dry, no rash Head:  AT/[place] Mouth:  Oral mucosa membranes appear dry, normal dentition Neck: supple; trachea midline Heart:  RRR; did not appreciate any M/R/G Lungs: CTAB, did not appreciate any wheezes, rales, or rhonchi Abdomen: + BS; soft, ND, NT Vascular: 2+ pedal pulses b/l; 2+ radial pulses b/l Extremities: no peripheral edema, no muscle wasting Neuro: strength and sensation intact in upper and lower extremities b/l   Labs on Admission: I have personally reviewed following labs and imaging studies  CBC: Recent Labs  Lab 12/07/21 1504  WBC 12.9*  NEUTROABS 9.2*  HGB 9.9*  HCT 31.2*  MCV 101.6*  PLT 233   Basic Metabolic Panel: Recent Labs  Lab 12/07/21 1504  NA 139  K 4.3  CL 109  CO2 21*  GLUCOSE 137*  BUN 69*  CREATININE 3.65*  CALCIUM 9.8   GFR: Estimated Creatinine Clearance: 14.3 mL/min (A) (by C-G formula based on SCr of 3.65 mg/dL (H)). Liver Function Tests: Recent Labs  Lab 12/07/21 1504  AST 14*  ALT 12  ALKPHOS 52  BILITOT 0.4  PROT 6.6  ALBUMIN 3.5   No results for input(s):  LIPASE, AMYLASE in the last 168 hours. No results for input(s): AMMONIA in the last 168 hours. Coagulation Profile: No results for input(s): INR, PROTIME in the last 168 hours. Cardiac Enzymes: No results for input(s): CKTOTAL, CKMB, CKMBINDEX, TROPONINI in the last 168 hours. BNP (last 3 results) No results for input(s): PROBNP in the last 8760 hours. HbA1C: No results for input(s): HGBA1C in the last 72 hours. CBG: No results for input(s): GLUCAP in the last 168 hours. Lipid Profile: No results for input(s): CHOL, HDL, LDLCALC, TRIG, CHOLHDL, LDLDIRECT in the last 72 hours. Thyroid Function Tests: No results for input(s): TSH, T4TOTAL, FREET4, T3FREE, THYROIDAB in the last 72 hours. Anemia Panel: No results for input(s): VITAMINB12, FOLATE, FERRITIN, TIBC, IRON, RETICCTPCT in the last 72 hours. Urine analysis:    Component Value Date/Time   COLORURINE YELLOW 12/29/2012 [place] 12/29/2012 1650   LABSPEC 1.025 12/29/2012 1650   PHURINE 5.5 12/29/2012 1650   GLUCOSEU NEGATIVE 12/29/2012 1650   HGBUR MODERATE (A) 12/29/2012 1650   BILIRUBINUR SMALL (A) 12/29/2012 1650   KETONESUR NEGATIVE 12/29/2012 1650   PROTEINUR >300 (A) 12/29/2012 1650   UROBILINOGEN 1.0 12/29/2012 1650   NITRITE NEGATIVE 12/29/2012 1650   LEUKOCYTESUR NEGATIVE 12/29/2012 1650    Radiological Exams on Admission: DG Chest 2 View  Result Date: 12/07/2021 CLINICAL DATA:  Coughing up mucus for a couple weeks EXAM: CHEST - 2 VIEW COMPARISON:  Chest radiograph 11/27/2021 FINDINGS: The heart is at the upper limits of normal for size, unchanged. The upper mediastinal contours are normal. The lungs hyperinflated with flattening of the diaphragms suggesting underlying COPD. Patchy retrocardiac opacities are again seen in the lateral projection which could reflect persistent infection. There is no other focal airspace disease. There is no pleural effusion or pneumothorax. There is no acute osseous  abnormality. IMPRESSION: Persistent patchy opacities in the retrocardiac region seen on the lateral projection are suspicious for persistent infection. Recommend follow-up radiographs in 6-8 weeks to assess for resolution. Electronically Signed   By: Court Joy.D.  On: 12/07/2021 15:26     EKG: Independently reviewed, with result as described above.    Assessment/Plan    Principal Problem:   Severe sepsis (HCC) Active Problems:   Allergic rhinitis   COPD (chronic obstructive pulmonary disease) (HCC)   Hyperlipidemia   DM2 (diabetes mellitus, type 2) (HCC)   Chronic diastolic CHF (congestive heart failure) (HCC)   HTN (hypertension)   Depression with anxiety   CAP (community acquired pneumonia)   Acute renal failure superimposed on stage 4 chronic kidney disease (HCC)   Acute metabolic encephalopathy   Anemia, unspecified        #) Severe sepsis due to community-acquired pneumonia: In the setting of 10 days of progressive associated shortness of breath, mildly increased work of breathing, with worsening of the symptoms in spite of recently completed course of doxycycline for retrocardiac airspace opacity identified via plain films on 11/27/2021, and repeat two-view chest x-ray performed this evening showing persistence of this retrocardiac airspace opacity.  Concerning for persistence of pneumonia with failure of outpatient antibiotics given the above history, previous radiographic findings, and persistence/progression of the patient's acute respiratory symptoms following course of doxycycline.   SIRS criteria met via leukocytosis, tachypnea. Lactic acid level: 0.8. Of note, given the associated presence of suspected end organ damage in the form of concominant presenting acute kidney injury superimposed on stage IV CKD, criteria are met for pt's sepsis to be considered severe in nature. However, in the absence of lactic acid level that is greater than or equal to 4.0, and in the  absence of any associated hypotension refractory to IVF's, there are no indications for administration of a 30 mL/kg IVF bolus at this time.   Additional ED work-up/management notable for: Collection of blood cultures followed by initiation azithromycin and Rocephin, which will be continued for now, with close ensuing clinical monitoring to monitor for need for further expansion of this spectrum of coverage.  Of note, COVID-19 PCR negative when checked in the ED this evening. Will also check UA.    Plan: CBC w/ diff and CMP in AM.  Follow for results of blood cx's x 2. Abx: Continue azithromycin and Rocephin, as above.  Gentle IV fluids overnight.  Add on procalcitonin level.  Check serum magnesium and phosphorus levels.  Incentive spirometry and flutter valve.  Check strep pneumoniae urine antigen.  Add on BNP.  In the context of underlying history of COPD, also scheduled duo nebulizer treatments and add prn albuterol nebs.  Monitor continuous pulse oximetry.         #) Acute Kidney Injury superimposed on stage IV CKD: In the context of documented history of stage IV CKD associated baseline creatinine range of 2.9-3.0, presenting serum creatinine noted to be 3.65.  Suspect that this is prerenal in nature on the basis of intravascular depletion stemming from severe sepsis due to pneumonia.  No overt pharmacologic contributing factors at this time.  No evidence of hyperkalemia, anion gap metabolic acidosis, or acute volume overload. Will provide gentle IV fluids, while pursuing further evaluation management of presenting severe sepsis due to pneumonia, closely monitor ensuing renal response to these measures.    Plan: monitor strict I's & O's and daily weights. Attempt to avoid nephrotoxic agents. Refrain from NSAIDs. Repeat CMP in the morning. Check serum magnesium level.  Check urinalysis with microscopy.  Add-on random urine sodium and random urine creatinine.  Check CPK level.  Gentle IV fluids  in the form of lactated Ringer's at 20  cc/h x 8 hours. if renal function does not improve with the above measures, can consider obtaining a renal US to evaluate for parenchymal abnormality as well as to assess for evidence of post-renal obstructive process.          #) Acute metabolic encephalopathy: Patient's son is noted the patient to exhibit evidence of 1 to 2 days of confusion and somnolence relative to the patient's baseline mental status which she carries underlying diagnosis of Alzheimer's dementia. Patient's acute encephalopathy appears to be on the basis of physiologic stressors stemming from presenting severe sepsis d/t suspected persistent pneumonia, as further detailed above, with potential additional metabolic contributions from acute kidney injury superimposed on stage IV CKD.  Potential mild pharmacologic factors on a secondary basis in the setting of multiple central acting medications as an outpatient, including Paxil and scheduled trazodone.   No obvious additional contributory underlying infectious process at this time, including negative COVID-19 PCR performed today. Will check UA to further assess.    No overt acute focal neurologic deficits to suggest a contribution from an underlying acute CVA. Seizures are also felt to be less likely.  Particularly in the setting of a documented history of COPD, with increased risk for exacerbation thereof in the context of presenting pneumonia, will check VBG to evaluate for any contribution from hypercapnic encephalopathy.    Plan: Repeat CMP/CBC in the AM. Check magnesium level. check VBG, TSH, MMA.  Hold outpatient trazodone and Paxil for now.  Work-up and management of severe sepsis on the basis of suspected pneumonia, as described above, in addition to further evaluation management of AKI on CKD 4.  Check urinalysis, urinary drug screen, CPK level, ionized calcium level.  Check INR.           #) Essential Hypertension:  documented h/o such, with outpatient antihypertensive regimen including Lopressor, Norvasc, hydralazine.  SBP's in the ED today: Initially in the 110s, subsequent proving with interval IV fluids, swelling quantified above.  In the setting of presenting severe sepsis, will hold home antihypertensive medications for now.  Plan: Close monitoring of subsequent BP via routine VS. hold home antihypertensive medications for now.          #) Depression: Documented history of such, on Paxil as an outpatient.  In the setting of presenting acute encephalopathy, will hold this of central acting medication for now, as further detailed above.  Plan: Hold on Paxil for now.        #) COPD: Documented history of such in the setting of being a former smoker, with 45-pack-year history of smoking prior to complete discontinuation in the early 1990s.  As an outpatient, appears to be on prn albuterol, but in the absence of any scheduled respiratory regimen.  No overt evidence of acute COPD exacerbation at this time, although he is certainly at risk for ensuing development of such in the context of suspected presenting pneumonia, as above.  Consequently, will emphasize pulmonary toilet, start scheduled duo nebulizer treatments, prn albuterol treatments while pursuing further management of severe sepsis due to pneumonia.  For now, we will refrain from initiation of systemic corticosteroids.  Plan: Scheduled duo nebulizer treatments.  Prn albuterol nebulizer.  Flutter valve, NSAIDs [place].  Check VBG.  Repeat CMP in the morning.  Add on serum magnesium and phosphorus levels.        #) Chronic anemia: Documented history of such, associated baseline hemoglobin range of 10-12, with presenting hemoglobin consistent with his baseline range, in the absence  of any overt evidence of active bleed.  Likely multifactorial in its etiology, with contributions from anemia of chronic disease/anemia of chronic kidney  disease, although presenting CBC reflects mild macrocytic findings.   Plan: Check INR, MMA level.  Repeat CBC in the morning.          #) Type 2 Diabetes Mellitus: documented history of such, which appears to be managed via lifestyle modifications in the absence of any outpatient insulin or oral hypoglycemic regimen after most recent hemoglobin A1c in December 2022 was noted to be 5.8%.  Presenting blood sugar this evening 137.  In the context of increased risk for relative hypoglycemia in the context of physiologic stress stemming from presenting severe sepsis, will monitor blood sugars (hospitalization, and provide very low-dose sliding scale  Plan: accuchecks QAC and HS with very low dose SSI.            #) Hyperlipidemia: documented h/o such. On high intensity atorvastatin as outpatient.    Plan: continue home statin.           #) Allergic Rhinitis: documented h/o such, on scheduled intranasal Flonase as outpatient, in addition to scheduled Claritin.    Plan: cont home Flonase and Claritin.             #) Chronic diastolic heart failure: documented history of such, with most recent echocardiogram performed in 2014 notable for LVEF 60 to 65%, and grade 1 diastolic dysfunction. No clinical or radiographic evidence to suggest acutely decompensated heart failure at this time, including 2 view chest x-ray showing no evidence of edema, effusion. home diuretic regimen reportedly consists of the following: None.  Rather, patient appears clinically mildly dehydrated.  We will provide very gentle IV fluids, closely monitor for ensuing evidence of acute 5 overload.  Plan: monitor strict I's & O's and daily weights. Repeat BMP in AM. Check serum mag level.  Check BNP.      DVT prophylaxis: SCD's   Code Status: DNR (consistent with code status during most recent prior hospitalizations) Disposition Plan: Per Rounding Team Consults called: none;  Admission  status: Inpatient   PLEASE NOTE THAT DRAGON DICTATION SOFTWARE WAS USED IN THE CONSTRUCTION OF THIS NOTE.   [place] DO [facility] [place]   12/07/2021, 10:53 PM

## 2021-12-08 DIAGNOSIS — G9341 Metabolic encephalopathy: Secondary | ICD-10-CM | POA: Diagnosis not present

## 2021-12-08 DIAGNOSIS — R6520 Severe sepsis without septic shock: Secondary | ICD-10-CM | POA: Diagnosis not present

## 2021-12-08 DIAGNOSIS — A419 Sepsis, unspecified organism: Secondary | ICD-10-CM | POA: Diagnosis not present

## 2021-12-08 LAB — COMPREHENSIVE METABOLIC PANEL
ALT: 14 U/L (ref 0–44)
AST: 13 U/L — ABNORMAL LOW (ref 15–41)
Albumin: 2.9 g/dL — ABNORMAL LOW (ref 3.5–5.0)
Alkaline Phosphatase: 57 U/L (ref 38–126)
Anion gap: 6 (ref 5–15)
BUN: 61 mg/dL — ABNORMAL HIGH (ref 8–23)
CO2: 20 mmol/L — ABNORMAL LOW (ref 22–32)
Calcium: 9.6 mg/dL (ref 8.9–10.3)
Chloride: 114 mmol/L — ABNORMAL HIGH (ref 98–111)
Creatinine, Ser: 3.17 mg/dL — ABNORMAL HIGH (ref 0.61–1.24)
GFR, Estimated: 19 mL/min — ABNORMAL LOW (ref >60)
Glucose, Bld: 127 mg/dL — ABNORMAL HIGH (ref 70–99)
Potassium: 4.1 mmol/L (ref 3.5–5.1)
Sodium: 140 mmol/L (ref 135–145)
Total Bilirubin: 0.6 mg/dL (ref 0.3–1.2)
Total Protein: 6 g/dL — ABNORMAL LOW (ref 6.5–8.1)

## 2021-12-08 LAB — BLOOD GAS, VENOUS
Acid-base deficit: 5.6 mmol/L — ABNORMAL HIGH (ref 0.0–2.0)
Bicarbonate: 21.1 mmol/L (ref 20.0–28.0)
O2 Saturation: 78.3 %
Patient temperature: 37
pCO2, Ven: 45 mmHg (ref 44–60)
pH, Ven: 7.28 (ref 7.25–7.43)
pO2, Ven: 45 mmHg (ref 32–45)

## 2021-12-08 LAB — URINALYSIS, COMPLETE (UACMP) WITH MICROSCOPIC
Bacteria, UA: NONE SEEN
Bilirubin Urine: NEGATIVE
Glucose, UA: NEGATIVE mg/dL
Hgb urine dipstick: NEGATIVE
Ketones, ur: NEGATIVE mg/dL
Leukocytes,Ua: NEGATIVE
Nitrite: NEGATIVE
Protein, ur: 100 mg/dL — AB
Specific Gravity, Urine: 1.012 (ref 1.005–1.030)
pH: 5 (ref 5.0–8.0)

## 2021-12-08 LAB — CBC WITH DIFFERENTIAL/PLATELET
Abs Immature Granulocytes: 0.14 10*3/uL — ABNORMAL HIGH (ref 0.00–0.07)
Basophils Absolute: 0.1 10*3/uL (ref 0.0–0.1)
Basophils Relative: 1 %
Eosinophils Absolute: 0.2 10*3/uL (ref 0.0–0.5)
Eosinophils Relative: 2 %
HCT: 31.4 % — ABNORMAL LOW (ref 39.0–52.0)
Hemoglobin: 10 g/dL — ABNORMAL LOW (ref 13.0–17.0)
Immature Granulocytes: 1 %
Lymphocytes Relative: 17 %
Lymphs Abs: 1.8 10*3/uL (ref 0.7–4.0)
MCH: 33 pg (ref 26.0–34.0)
MCHC: 31.8 g/dL (ref 30.0–36.0)
MCV: 103.6 fL — ABNORMAL HIGH (ref 80.0–100.0)
Monocytes Absolute: 1.3 10*3/uL — ABNORMAL HIGH (ref 0.1–1.0)
Monocytes Relative: 12 %
Neutro Abs: 7.4 10*3/uL (ref 1.7–7.7)
Neutrophils Relative %: 67 %
Platelets: 149 10*3/uL — ABNORMAL LOW (ref 150–400)
RBC: 3.03 MIL/uL — ABNORMAL LOW (ref 4.22–5.81)
RDW: 14.4 % (ref 11.5–15.5)
WBC: 10.9 10*3/uL — ABNORMAL HIGH (ref 4.0–10.5)
nRBC: 0 % (ref 0.0–0.2)

## 2021-12-08 LAB — RAPID URINE DRUG SCREEN, HOSP PERFORMED
Amphetamines: NOT DETECTED
Barbiturates: NOT DETECTED
Benzodiazepines: NOT DETECTED
Cocaine: NOT DETECTED
Opiates: NOT DETECTED
Tetrahydrocannabinol: NOT DETECTED

## 2021-12-08 LAB — PHOSPHORUS: Phosphorus: 3.6 mg/dL (ref 2.5–4.6)

## 2021-12-08 LAB — STREP PNEUMONIAE URINARY ANTIGEN: Strep Pneumo Urinary Antigen: NEGATIVE

## 2021-12-08 LAB — MAGNESIUM: Magnesium: 2.3 mg/dL (ref 1.7–2.4)

## 2021-12-08 LAB — CK: Total CK: 39 U/L — ABNORMAL LOW (ref 49–397)

## 2021-12-08 LAB — PROTIME-INR
INR: 1.1 (ref 0.8–1.2)
Prothrombin Time: 14.1 seconds (ref 11.4–15.2)

## 2021-12-08 LAB — CREATININE, URINE, RANDOM: Creatinine, Urine: 71.99 mg/dL

## 2021-12-08 LAB — PROCALCITONIN: Procalcitonin: 0.1 ng/mL

## 2021-12-08 LAB — GLUCOSE, CAPILLARY
Glucose-Capillary: 114 mg/dL — ABNORMAL HIGH (ref 70–99)
Glucose-Capillary: 123 mg/dL — ABNORMAL HIGH (ref 70–99)
Glucose-Capillary: 135 mg/dL — ABNORMAL HIGH (ref 70–99)
Glucose-Capillary: 154 mg/dL — ABNORMAL HIGH (ref 70–99)

## 2021-12-08 LAB — SODIUM, URINE, RANDOM: Sodium, Ur: 70 mmol/L

## 2021-12-08 LAB — TSH: TSH: 1.67 u[IU]/mL (ref 0.350–4.500)

## 2021-12-08 MED ORDER — IPRATROPIUM-ALBUTEROL 0.5-2.5 (3) MG/3ML IN SOLN
3.0000 mL | Freq: Three times a day (TID) | RESPIRATORY_TRACT | Status: DC
  Administered 2021-12-09 – 2021-12-14 (×15): 3 mL via RESPIRATORY_TRACT
  Filled 2021-12-08 (×16): qty 3

## 2021-12-08 MED ORDER — LACTATED RINGERS IV SOLN: INTRAVENOUS | Status: AC

## 2021-12-08 MED ORDER — ATORVASTATIN CALCIUM 40 MG PO TABS
80.0000 mg | ORAL_TABLET | Freq: Every day | ORAL | Status: DC
  Administered 2021-12-08 – 2021-12-13 (×6): 80 mg via ORAL
  Filled 2021-12-08 (×6): qty 2

## 2021-12-08 MED ORDER — ORAL CARE MOUTH RINSE
15.0000 mL | Freq: Two times a day (BID) | OROMUCOSAL | Status: DC
  Administered 2021-12-08 – 2021-12-14 (×11): 15 mL via OROMUCOSAL

## 2021-12-08 NOTE — Progress Notes (Signed)
  Progress Note   Patient: Ian [name] HUD:[id number] DOB: March 24, 1936 DOA: 12/07/2021     1 DOS: the patient was seen and examined on 12/08/2021   Brief hospital course: 86 y.o. male with medical history significant for Alzheimer's dementia, stage IV CKD with baseline creatinine 2.9-3.0, hypertension, depression, COPD, chronic anemia with baseline hemoglobin 10-12, hyperlipidemia, chronic diastolic heart failure, of who is admitted to [facility] on 12/07/2021 by way of transfer from [facility] emergency department with severe sepsis due to community-acquired pneumonia after presenting from home to [facility] emergency department for further evaluation of altered mental status.   Pt was found to have concerns of CAP  Assessment and Plan: Severe sepsis with commune acquired pneumonia present on admission -Recently completed course of doxycycline as outpatient.  Chest imaging notable for airspace disease consistent with pneumonia.  Patient presented with leukocytosis, tachycardia, encephalopathy with renal failure. -Mentation seems much improved today.  Patient appears oriented and conversant. -Patient has been continued on azithromycin and Rocephin -Leukocytosis is improving, currently 10.9 -3 L nasal cannula.  Patient reports being O2 nave at baseline.  Wean O2 as tolerated -Repeat CBC and comprehensive metabolic panel in the morning  Chronic anemia -Hemodynamically stable at the time -Hemoglobin remained stable -Repeat CBC in morning  Acute toxic metabolic encephalopathy -Seems much improved conversing appropriately, oriented on exam -Continue with antibiotics as per above  Acute renal failure on stage IV CKD -Presenting creatinine 3.65, baseline of around 2.8 -Creatinine down to 8.58 -Basic metabolic panel morning Hypertension chronic diastolic congestive heart failure -Blood pressure appears stable at this time -Continue current regimen  Type 2 diabetes -Glycemic trends  reviewed, stable -Continue SSI coverage as needed  Hyperlipidemia -Continue statin per home regimen  COPD -Currently on 3 L nasal cannula -We will continue patient on as needed albuterol and scheduled DuoNebs -O2 as tolerated -No audible wheezing on exam this morning     Subjective: Reports feeling better this morning  Physical Exam: Vitals:   12/08/21 0611 12/08/21 0852 12/08/21 1432 12/08/21 1513  BP: (!) 153/69   (!) 152/59  Pulse: (!) 56   62  Resp: 14   (!) 22  Temp: 98.5 F (36.9 C)   97.8 F (36.6 C)  TempSrc: Oral   Oral  SpO2: 90% 90% (!) 87% 97%  Weight:      Height:       General exam: Awake, laying in bed, in nad Respiratory system: Normal respiratory effort, no wheezing Cardiovascular system: regular rate, s1, s2 Gastrointestinal system: Soft, nondistended, positive BS Central nervous system: CN2-12 grossly intact, strength intact Extremities: Perfused, no clubbing Skin: Normal skin turgor, no notable skin lesions seen Psychiatry: Mood normal // no visual hallucinations   Data Reviewed:  Labs reviewed (sodium 140, potassium 4.1, creatinine 3.17 WBC 10.9, hemoglobin 10,  Family Communication:, Pt in room, Family not at bedside  Disposition: Status is: Inpatient Remains inpatient appropriate because: Severity of illness  Planned Discharge Destination: Home     Author: Marylu Lund, MD 12/08/2021 5:08 PM  For on call review [email or web address].

## 2021-12-08 NOTE — Hospital Course (Signed)
86 y.o. male with medical history significant for Alzheimer's dementia, stage IV CKD with baseline creatinine 2.9-3.0, hypertension, depression, COPD, chronic anemia with baseline hemoglobin 10-12, hyperlipidemia, chronic diastolic heart failure, of who is admitted to [facility] on 12/07/2021 by way of transfer from [facility] emergency department with severe sepsis due to community-acquired pneumonia after presenting from home to [facility] emergency department for further evaluation of altered mental status.   Pt was found to have concerns of CAP

## 2021-12-08 NOTE — Plan of Care (Signed)
  Problem: Education: Goal: Knowledge of General Education information will improve Description: Including pain rating scale, medication(s)/side effects and non-pharmacologic comfort measures Outcome: Progressing   Problem: Activity: Goal: Risk for activity intolerance will decrease Outcome: Progressing   Problem: Nutrition: Goal: Adequate nutrition will be maintained Outcome: Progressing   Problem: Elimination: Goal: Will not experience complications related to bowel motility Outcome: Progressing Goal: Will not experience complications related to urinary retention Outcome: Progressing   Problem: Pain Managment: Goal: General experience of comfort will improve Outcome: Progressing   Problem: Safety: Goal: Ability to remain free from injury will improve Outcome: Progressing   Problem: Skin Integrity: Goal: Risk for impaired skin integrity will decrease Outcome: Progressing   Problem: Education: Goal: Ability to describe self-care measures that may prevent or decrease complications (Diabetes Survival Skills Education) will improve Outcome: Progressing Goal: Individualized Educational Video(s) Outcome: Progressing   Problem: Coping: Goal: Ability to adjust to condition or change in health will improve Outcome: Progressing   Problem: Fluid Volume: Goal: Ability to maintain a balanced intake and output will improve Outcome: Progressing   Problem: Health Behavior/Discharge Planning: Goal: Ability to identify and utilize available resources and services will improve Outcome: Progressing Goal: Ability to manage health-related needs will improve Outcome: Progressing   Problem: Metabolic: Goal: Ability to maintain appropriate glucose levels will improve Outcome: Progressing   Problem: Nutritional: Goal: Maintenance of adequate nutrition will improve Outcome: Progressing Goal: Progress toward achieving an optimal weight will improve Outcome: Progressing   Problem:  Skin Integrity: Goal: Risk for impaired skin integrity will decrease Outcome: Progressing   Problem: Tissue Perfusion: Goal: Adequacy of tissue perfusion will improve Outcome: Progressing

## 2021-12-09 DIAGNOSIS — R6520 Severe sepsis without septic shock: Secondary | ICD-10-CM | POA: Diagnosis not present

## 2021-12-09 DIAGNOSIS — A419 Sepsis, unspecified organism: Secondary | ICD-10-CM | POA: Diagnosis not present

## 2021-12-09 DIAGNOSIS — G9341 Metabolic encephalopathy: Secondary | ICD-10-CM | POA: Diagnosis not present

## 2021-12-09 LAB — CBC
HCT: 30.4 % — ABNORMAL LOW (ref 39.0–52.0)
Hemoglobin: 9.8 g/dL — ABNORMAL LOW (ref 13.0–17.0)
MCH: 33.2 pg (ref 26.0–34.0)
MCHC: 32.2 g/dL (ref 30.0–36.0)
MCV: 103.1 fL — ABNORMAL HIGH (ref 80.0–100.0)
Platelets: 155 10*3/uL (ref 150–400)
RBC: 2.95 MIL/uL — ABNORMAL LOW (ref 4.22–5.81)
RDW: 14 % (ref 11.5–15.5)
WBC: 10.2 10*3/uL (ref 4.0–10.5)
nRBC: 0 % (ref 0.0–0.2)

## 2021-12-09 LAB — COMPREHENSIVE METABOLIC PANEL
ALT: 19 U/L (ref 0–44)
AST: 20 U/L (ref 15–41)
Albumin: 2.8 g/dL — ABNORMAL LOW (ref 3.5–5.0)
Alkaline Phosphatase: 56 U/L (ref 38–126)
Anion gap: 5 (ref 5–15)
BUN: 51 mg/dL — ABNORMAL HIGH (ref 8–23)
CO2: 23 mmol/L (ref 22–32)
Calcium: 9.9 mg/dL (ref 8.9–10.3)
Chloride: 113 mmol/L — ABNORMAL HIGH (ref 98–111)
Creatinine, Ser: 2.88 mg/dL — ABNORMAL HIGH (ref 0.61–1.24)
GFR, Estimated: 21 mL/min — ABNORMAL LOW (ref >60)
Glucose, Bld: 97 mg/dL (ref 70–99)
Potassium: 4.4 mmol/L (ref 3.5–5.1)
Sodium: 141 mmol/L (ref 135–145)
Total Bilirubin: 0.7 mg/dL (ref 0.3–1.2)
Total Protein: 6 g/dL — ABNORMAL LOW (ref 6.5–8.1)

## 2021-12-09 LAB — CALCIUM, IONIZED: Calcium, Ionized, Serum: 5.6 mg/dL (ref 4.5–5.6)

## 2021-12-09 LAB — GLUCOSE, CAPILLARY
Glucose-Capillary: 109 mg/dL — ABNORMAL HIGH (ref 70–99)
Glucose-Capillary: 127 mg/dL — ABNORMAL HIGH (ref 70–99)
Glucose-Capillary: 108 mg/dL — ABNORMAL HIGH (ref 70–99)
Glucose-Capillary: 166 mg/dL — ABNORMAL HIGH (ref 70–99)

## 2021-12-09 NOTE — Progress Notes (Signed)
  Progress Note   Patient: Ian [name] QJF:[id number] DOB: [birth year] DOA: 12/07/2021     2 DOS: the patient was seen and examined on 12/09/2021   Brief hospital course: 86 y.o. male with medical history significant for Alzheimer's dementia, stage IV CKD with baseline creatinine 2.9-3.0, hypertension, depression, COPD, chronic anemia with baseline hemoglobin 10-12, hyperlipidemia, chronic diastolic heart failure, of who is admitted to [facility] on 12/07/2021 by way of transfer from [facility] emergency department with severe sepsis due to community-acquired pneumonia after presenting from home to [facility] emergency department for further evaluation of altered mental status.   Pt was found to have concerns of CAP  Assessment and Plan: Severe sepsis with commune acquired pneumonia present on admission -Recently completed course of doxycycline as outpatient.  Chest imaging notable for airspace disease consistent with pneumonia.  Patient presented with leukocytosis, tachycardia, encephalopathy with renal failure. -Mentation seems much improved today.  Patient appears oriented and conversant. -Patient has been continued on azithromycin and Rocephin -Leukocytosis is improving, currently 10.2 -On 4 L nasal cannula.  Patient reports being O2 nave at baseline.  Cont to wean o2 as tolerated  Chronic anemia -Hemodynamically stable at the time -Hemoglobin stable -Cont to follow CBC trends  Acute toxic metabolic encephalopathy -Seems much improved conversing appropriately, oriented on exam -Cont with abx per above  Acute renal failure on stage IV CKD -Presenting creatinine 3.65, baseline of around 2.8 -Creatinine down to 2.88 -Recheck bmet in AM  Hypertension chronic diastolic congestive heart failure -Blood pressure appears stable at this time -Continue current regimen  Type 2 diabetes -Glycemic trends reviewed, stable -Continue SSI coverage as needed  Hyperlipidemia -Continue  statin per home regimen  COPD -Currently on 4 L nasal cannula -We will continue patient on as needed albuterol and scheduled DuoNebs -Wean O2 as tolerated -No audible wheezing on exam this morning     Subjective: Reports feeling better this AM. States breathing better  Physical Exam: Vitals:   12/09/21 0444 12/09/21 0609 12/09/21 0814 12/09/21 1242  BP:  (!) 164/68  (!) 168/69  Pulse:  64  67  Resp:  20  18  Temp:  98.2 F (36.8 C)  98.4 F (36.9 C)  TempSrc:    Oral  SpO2:  93% 90% 94%  Weight: 77.2 kg     Height:       General exam: Conversant, in no acute distress Respiratory system: normal chest rise, clear, no audible wheezing Cardiovascular system: regular rhythm, s1-s2 Gastrointestinal system: Nondistended, nontender, pos BS Central nervous system: No seizures, no tremors Extremities: No cyanosis, no joint deformities Skin: No rashes, no pallor Psychiatry: Affect normal // no auditory hallucinations   Data Reviewed:  Labs reviewed (sodium 141, potassium 4.4, creatinine 2.88 WBC 9.8, hemoglobin 10.2  Family Communication:, Pt in room, Family not at bedside  Disposition: Status is: Inpatient Remains inpatient appropriate because: Severity of illness  Planned Discharge Destination: Home     Author: Marylu Lund, MD 12/09/2021 2:54 PM  For on call review [email or web address].

## 2021-12-09 NOTE — Evaluation (Signed)
Physical Therapy Evaluation Patient Details Name: Ian [name] MRN: [id number] DOB: [birth year] Today's Date: 12/09/2021  History of Present Illness  86 y.o. male admitted with sepsis, Pna, AMS. History significant for Alzheimer's dementia, stage IV CKD, hypertension, depression, COPD, chronic anemia, hyperlipidemia, chronic diastolic heart failure  Clinical Impression  On eval, pt required Min A for mobility. He walked ~60 feet with use of hallway handrail for steadying assistance (he does not use a device at baseline). Pt is unsteady and LEs are weak. He fatigues fairly easily. O2 90% on 4L while ambulating.  Pt reports living alone. I am hopeful that his mobility/balance/general strength will improve enough and O2 can be weaned so that he can return home. Will plan to follow pt during this hospital stay. Recommend OOB with nursing, in addition to PT session to increase activity.      Recommendations for follow up therapy are one component of a multi-disciplinary discharge planning process, led by the attending physician.  Recommendations may be updated based on patient status, additional functional criteria and insurance authorization.  Follow Up Recommendations Home health PT (provided mobility improves/O2 can be weaned???)    Assistance Recommended at Discharge Intermittent Supervision/Assistance  Patient can return home with the following  A little help with walking and/or transfers;A little help with bathing/dressing/bathroom;Assist for transportation;Assistance with cooking/housework;Help with stairs or ramp for entrance    Equipment Recommendations None recommended by PT  Recommendations for Other Services  OT consult    Functional Status Assessment Patient has had a recent decline in their functional status and demonstrates the ability to make significant improvements in function in a reasonable and predictable amount of time.     Precautions / Restrictions Precautions Precautions:  Fall Precaution Comments: monitor O2 Restrictions Weight Bearing Restrictions: No      Mobility  Bed Mobility Overal bed mobility: Needs Assistance Bed Mobility: Supine to Sit, Sit to Supine     Supine to sit: Supervision Sit to supine: Supervision   General bed mobility comments: for safety, lines.    Transfers Overall transfer level: Needs assistance Equipment used: None Transfers: Sit to/from Stand Sit to Stand: Min assist           General transfer comment: Assist to steady.    Ambulation/Gait Ambulation/Gait assistance: Min assist Gait Distance (Feet): 60 Feet Assistive device:  (hallway handrail) Gait Pattern/deviations: Step-through pattern, Decreased stride length       General Gait Details: Unsteady. LEs weak. Fatigues fairly easily. Denied dyspnea. O2 90% on 4L with ambulation  Stairs            Wheelchair Mobility    Modified Rankin (Stroke Patients Only)       Balance Overall balance assessment: Needs assistance         Standing balance support: No upper extremity supported Standing balance-Leahy Scale: Poor                               Pertinent Vitals/Pain Pain Assessment Pain Assessment: Faces Faces Pain Scale: No hurt    Home Living Family/patient expects to be discharged to:: Private residence Living Arrangements: Alone Available Help at Discharge: Family;Available PRN/intermittently Type of Home: House Home Access: Stairs to enter   Entrance Stairs-Number of Steps: 2   Home Layout: One level Home Equipment: Conservation officer, nature (2 wheels);Grab bars - tub/shower;Cane - single point      Prior Function Prior Level of Function : Independent/Modified Independent  Mobility Comments: doesn't use a device ADLs Comments: independent, drives     Hand Dominance   Dominant Hand: Right    Extremity/Trunk Assessment   Upper Extremity Assessment Upper Extremity Assessment: Defer to OT  evaluation    Lower Extremity Assessment Lower Extremity Assessment: Generalized weakness    Cervical / Trunk Assessment Cervical / Trunk Assessment: Kyphotic  Communication   Communication: No difficulties  Cognition Arousal/Alertness: Awake/alert Behavior During Therapy: WFL for tasks assessed/performed Overall Cognitive Status: Within Functional Limits for tasks assessed                                          General Comments      Exercises     Assessment/Plan    PT Assessment Patient needs continued PT services  PT Problem List Decreased strength;Decreased mobility;Decreased balance;Decreased activity tolerance;Decreased knowledge of use of DME       PT Treatment Interventions DME instruction;Gait training;Therapeutic exercise;Therapeutic activities;Functional mobility training;Balance training;Patient/family education    PT Goals (Current goals can be found in the Care Plan section)  Acute Rehab PT Goals Patient Stated Goal: home. regain PLOF/ind PT Goal Formulation: With patient Time For Goal Achievement: 12/23/21 Potential to Achieve Goals: Good    Frequency Min 3X/week     Co-evaluation               AM-PAC PT "6 Clicks" Mobility  Outcome Measure Help needed turning from your back to your side while in a flat bed without using bedrails?: None Help needed moving from lying on your back to sitting on the side of a flat bed without using bedrails?: None Help needed moving to and from a bed to a chair (including a wheelchair)?: A Little Help needed standing up from a chair using your arms (e.g., wheelchair or bedside chair)?: A Little Help needed to walk in hospital room?: A Little Help needed climbing 3-5 steps with a railing? : A Lot 6 Click Score: 19    End of Session Equipment Utilized During Treatment: Gait belt;Oxygen Activity Tolerance: Patient tolerated treatment well;Patient limited by fatigue Patient left: in bed;with  call bell/phone within reach;with bed alarm set   PT Visit Diagnosis: Muscle weakness (generalized) (M62.81);Difficulty in walking, not elsewhere classified (R26.2)    Time: 6433-2951 PT Time Calculation (min) (ACUTE ONLY): 21 min   Charges:   PT Evaluation $PT Eval Moderate Complexity: 1 Mod             Doreatha Massed, PT Acute Rehabilitation  Office: [phone] Pager: [phone]

## 2021-12-09 NOTE — Evaluation (Signed)
Occupational Therapy Evaluation Patient Details Name: Ian [name] MRN: [id number] DOB: Apr 24, 1936 Today's Date: 12/09/2021   History of Present Illness 86 y.o. male with medical history significant for Alzheimer's dementia, stage IV CKD with baseline creatinine 2.9-3.0, hypertension, depression, COPD, chronic anemia with baseline hemoglobin 10-12, hyperlipidemia, chronic diastolic heart failure, of who is admitted to [facility] on 12/07/2021 with severe sepsis due to community-acquired pneumonia.   Clinical Impression   Ian [name] is an 86 year old man admitted with above medical history and presents on 3 L with o2 sat 91% supine in bed. Typically patient lives at home, is independent and doesn't use a device. On evaluation he demonstrates generalized weakness, decreased activity tolerance and impaired balance resulting in a sudden decline in functional independence. Without activity and on RA patient's o2 sat 84%. Patient required min assist for ambulation for steadying and a walker. Patient needed min assist for ADLs - to bathe, don hospital gown and stand at sink for grooming task. His oxygen maintained between 89-91% on 4 L with activity. Patient positioned in recliner and o2 sat hovered at 91% therefore oxygen boosted to 4 L [place]. Patient will benefit from skilled OT services while in hospital to improve deficits and learn compensatory strategies as needed in order to return to PLOF.  Expect patient to improve while hospitalized 1-2 days and be able to discharge with [facility] services.      Recommendations for follow up therapy are one component of a multi-disciplinary discharge planning process, led by the attending physician.  Recommendations may be updated based on patient status, additional functional criteria and insurance authorization.   Follow Up Recommendations  Home health OT    Assistance Recommended at Discharge PRN  Patient can return home with the following A little help with  walking and/or transfers;A little help with bathing/dressing/bathroom;Assistance with cooking/housework;Direct supervision/assist for financial management;Direct supervision/assist for medications management    Functional Status Assessment  Patient has had a recent decline in their functional status and demonstrates the ability to make significant improvements in function in a reasonable and predictable amount of time.  Equipment Recommendations  None recommended by OT    Recommendations for Other Services       Precautions / Restrictions Precautions Precautions: None Restrictions Weight Bearing Restrictions: No      Mobility Bed Mobility Overal bed mobility: Needs Assistance Bed Mobility: Supine to Sit     Supine to sit: Supervision     General bed mobility comments: reported left ankle pain when getting out of bed.    Transfers Overall transfer level: Needs assistance   Transfers: Sit to/from Stand, Bed to chair/wheelchair/BSC Sit to Stand: Min assist           General transfer comment: Min assist for steadying and cues how to use a walker. Ambulted to bathroom and in room.      Balance Overall balance assessment: Needs assistance Sitting-balance support: No upper extremity supported, Feet supported Sitting balance-Leahy Scale: Good     Standing balance support: Reliant on assistive device for balance Standing balance-Leahy Scale: Poor                             ADL either performed or assessed with clinical judgement   ADL Overall ADL's : Needs assistance/impaired Eating/Feeding: Independent   Grooming: Standing;Oral care   Upper Body Bathing: Set up;Sitting   Lower Body Bathing: Moderate assistance;Sit to/from stand   Upper  Body Dressing : Set up;Sitting   Lower Body Dressing: Moderate assistance;Sit to/from stand   Toilet Transfer: Minimal assistance;Regular Toilet;Rolling walker (2 wheels);Grab bars Toilet Transfer Details  (indicate cue type and reason): cues to turn away from IV lines and use grab bar Toileting- Clothing Manipulation and Hygiene: Minimal assistance;Sit to/from stand       Functional mobility during ADLs: Minimal assistance;Rolling walker (2 wheels)       Vision Patient Visual Report: No change from baseline       Perception     Praxis      Pertinent Vitals/Pain Pain Assessment Pain Assessment: Faces Pain Location: L ankle Pain Descriptors / Indicators: Grimacing     Hand Dominance Right   Extremity/Trunk Assessment Upper Extremity Assessment Upper Extremity Assessment: Overall WFL for tasks assessed   Lower Extremity Assessment Lower Extremity Assessment: Overall WFL for tasks assessed   Cervical / Trunk Assessment Cervical / Trunk Assessment: Normal   Communication Communication Communication: No difficulties   Cognition Arousal/Alertness: Awake/alert Behavior During Therapy: WFL for tasks assessed/performed Overall Cognitive Status: Within Functional Limits for tasks assessed                                 General Comments: alert and oriented     General Comments       Exercises     Shoulder Instructions      Home Living Family/patient expects to be discharged to:: Private residence Living Arrangements: Alone Available Help at Discharge: Family;Available PRN/intermittently Type of Home: House Home Access: Stairs to enter [employer] of Steps: 2   Home Layout: One level     Bathroom Shower/Tub: Walk-in shower;Tub/shower unit   Bathroom Toilet: Standard     Home Equipment: Conservation officer, nature (2 wheels);Grab bars - tub/shower;Cane - single point          Prior Functioning/Environment Prior Level of Function : Independent/Modified Independent             Mobility Comments: doesn't use a device ADLs Comments: independent, drives        OT Problem List: Decreased strength;Decreased activity tolerance;Impaired  balance (sitting and/or standing);Decreased knowledge of use of DME or AE;Cardiopulmonary status limiting activity      OT Treatment/Interventions: Self-care/ADL training;Therapeutic exercise;DME and/or AE instruction;Therapeutic activities;Balance training;Patient/family education    OT Goals(Current goals can be found in the care plan section) Acute Rehab OT Goals Patient Stated Goal: to get stronger OT Goal Formulation: With patient Time For Goal Achievement: 12/23/21 Potential to Achieve Goals: Good  OT Frequency: Min 2X/week    Co-evaluation              AM-PAC OT "6 Clicks" Daily Activity     Outcome Measure Help from another person eating meals?: None Help from another person taking care of personal grooming?: A Little Help from another person toileting, which includes using toliet, bedpan, or urinal?: A Little Help from another person bathing (including washing, rinsing, drying)?: A Little Help from another person to put on and taking off regular upper body clothing?: A Little Help from another person to put on and taking off regular lower body clothing?: A Little 6 Click Score: 19   End of Session Equipment Utilized During Treatment: Rolling walker (2 wheels) Nurse Communication: Mobility status  Activity Tolerance: Patient tolerated treatment well Patient left: in chair;with call bell/phone within reach;with chair alarm set  OT Visit Diagnosis: Unsteadiness on feet (R26.81);Muscle  weakness (generalized) (M62.81)                Time: 2411-4643 OT Time Calculation (min): 30 min Charges:  OT General Charges $OT Visit: 1 Visit OT Evaluation $OT Eval Low Complexity: 1 Low OT Treatments $Self Care/Home Management : 8-22 mins  Angelito Hopping, OTR/L [place] [phone] Pager: [place] 12/09/2021, 12:24 PM

## 2021-12-09 NOTE — Plan of Care (Signed)
  Problem: Education: Goal: Knowledge of General Education information will improve Description: Including pain rating scale, medication(s)/side effects and non-pharmacologic comfort measures Outcome: Progressing   Problem: Activity: Goal: Risk for activity intolerance will decrease Outcome: Progressing   Problem: Nutrition: Goal: Adequate nutrition will be maintained Outcome: Progressing   Problem: Elimination: Goal: Will not experience complications related to bowel motility Outcome: Progressing Goal: Will not experience complications related to urinary retention Outcome: Progressing   Problem: Pain Managment: Goal: General experience of comfort will improve Outcome: Progressing   Problem: Safety: Goal: Ability to remain free from injury will improve Outcome: Progressing   Problem: Skin Integrity: Goal: Risk for impaired skin integrity will decrease Outcome: Progressing   Problem: Education: Goal: Ability to describe self-care measures that may prevent or decrease complications (Diabetes Survival Skills Education) will improve Outcome: Progressing Goal: Individualized Educational Video(s) Outcome: Progressing   Problem: Coping: Goal: Ability to adjust to condition or change in health will improve Outcome: Progressing   Problem: Fluid Volume: Goal: Ability to maintain a balanced intake and output will improve Outcome: Progressing   Problem: Health Behavior/Discharge Planning: Goal: Ability to identify and utilize available resources and services will improve Outcome: Progressing Goal: Ability to manage health-related needs will improve Outcome: Progressing   Problem: Metabolic: Goal: Ability to maintain appropriate glucose levels will improve Outcome: Progressing   Problem: Nutritional: Goal: Maintenance of adequate nutrition will improve Outcome: Progressing Goal: Progress toward achieving an optimal weight will improve Outcome: Progressing   Problem:  Skin Integrity: Goal: Risk for impaired skin integrity will decrease Outcome: Progressing   Problem: Tissue Perfusion: Goal: Adequacy of tissue perfusion will improve Outcome: Progressing

## 2021-12-10 DIAGNOSIS — A419 Sepsis, unspecified organism: Secondary | ICD-10-CM | POA: Diagnosis not present

## 2021-12-10 DIAGNOSIS — R6520 Severe sepsis without septic shock: Secondary | ICD-10-CM | POA: Diagnosis not present

## 2021-12-10 DIAGNOSIS — G9341 Metabolic encephalopathy: Secondary | ICD-10-CM | POA: Diagnosis not present

## 2021-12-10 LAB — GLUCOSE, CAPILLARY
Glucose-Capillary: 117 mg/dL — ABNORMAL HIGH (ref 70–99)
Glucose-Capillary: 162 mg/dL — ABNORMAL HIGH (ref 70–99)
Glucose-Capillary: 104 mg/dL — ABNORMAL HIGH (ref 70–99)
Glucose-Capillary: 108 mg/dL — ABNORMAL HIGH (ref 70–99)

## 2021-12-10 LAB — COMPREHENSIVE METABOLIC PANEL
ALT: 23 U/L (ref 0–44)
AST: 24 U/L (ref 15–41)
Albumin: 2.7 g/dL — ABNORMAL LOW (ref 3.5–5.0)
Alkaline Phosphatase: 55 U/L (ref 38–126)
Anion gap: 6 (ref 5–15)
BUN: 43 mg/dL — ABNORMAL HIGH (ref 8–23)
CO2: 22 mmol/L (ref 22–32)
Calcium: 9.9 mg/dL (ref 8.9–10.3)
Chloride: 114 mmol/L — ABNORMAL HIGH (ref 98–111)
Creatinine, Ser: 2.68 mg/dL — ABNORMAL HIGH (ref 0.61–1.24)
GFR, Estimated: 23 mL/min — ABNORMAL LOW (ref >60)
Glucose, Bld: 126 mg/dL — ABNORMAL HIGH (ref 70–99)
Potassium: 4.4 mmol/L (ref 3.5–5.1)
Sodium: 142 mmol/L (ref 135–145)
Total Bilirubin: 0.7 mg/dL (ref 0.3–1.2)
Total Protein: 5.9 g/dL — ABNORMAL LOW (ref 6.5–8.1)

## 2021-12-10 LAB — CBC
HCT: 28.8 % — ABNORMAL LOW (ref 39.0–52.0)
Hemoglobin: 9.5 g/dL — ABNORMAL LOW (ref 13.0–17.0)
MCH: 33.3 pg (ref 26.0–34.0)
MCHC: 33 g/dL (ref 30.0–36.0)
MCV: 101.1 fL — ABNORMAL HIGH (ref 80.0–100.0)
Platelets: 149 10*3/uL — ABNORMAL LOW (ref 150–400)
RBC: 2.85 MIL/uL — ABNORMAL LOW (ref 4.22–5.81)
RDW: 13.8 % (ref 11.5–15.5)
WBC: 8.6 10*3/uL (ref 4.0–10.5)
nRBC: 0 % (ref 0.0–0.2)

## 2021-12-10 LAB — METHYLMALONIC ACID, SERUM: Methylmalonic Acid, Quantitative: 213 nmol/L (ref 0–378)

## 2021-12-10 MED ORDER — HYDRALAZINE HCL 25 MG PO TABS
25.0000 mg | ORAL_TABLET | Freq: Two times a day (BID) | ORAL | Status: DC
Start: 2021-12-10 — End: 2021-12-14
  Administered 2021-12-10 – 2021-12-14 (×9): 25 mg via ORAL
  Filled 2021-12-10 (×8): qty 1

## 2021-12-10 MED ORDER — DICLOFENAC SODIUM 1 % EX GEL
2.0000 g | Freq: Four times a day (QID) | CUTANEOUS | Status: AC
  Administered 2021-12-11 (×3): 2 g via TOPICAL
  Filled 2021-12-10: qty 100

## 2021-12-10 MED ORDER — PAROXETINE HCL 20 MG PO TABS
ORAL_TABLET | ORAL | Status: AC
  Filled 2021-12-10: qty 2

## 2021-12-10 MED ORDER — SODIUM CHLORIDE 0.9 % IV SOLN: INTRAVENOUS | Status: DC | PRN

## 2021-12-10 MED ORDER — AMLODIPINE BESYLATE 10 MG PO TABS
10.0000 mg | ORAL_TABLET | Freq: Every day | ORAL | Status: DC
  Administered 2021-12-10 – 2021-12-14 (×5): 10 mg via ORAL
  Filled 2021-12-10 (×4): qty 1

## 2021-12-10 MED ORDER — METOPROLOL TARTRATE 50 MG PO TABS
50.0000 mg | ORAL_TABLET | Freq: Two times a day (BID) | ORAL | Status: DC
  Administered 2021-12-10 – 2021-12-14 (×9): 50 mg via ORAL
  Filled 2021-12-10 (×9): qty 1

## 2021-12-10 MED ORDER — HYDRALAZINE HCL 20 MG/ML IJ SOLN
10.0000 mg | INTRAMUSCULAR | Status: DC | PRN
Start: 2021-12-10 — End: 2021-12-14
  Administered 2021-12-10 – 2021-12-14 (×5): 10 mg via INTRAVENOUS
  Filled 2021-12-10 (×5): qty 1

## 2021-12-10 MED ORDER — PAROXETINE HCL 20 MG PO TABS
40.0000 mg | ORAL_TABLET | Freq: Every day | ORAL | Status: DC
  Administered 2021-12-10 – 2021-12-14 (×5): 40 mg via ORAL
  Filled 2021-12-10 (×4): qty 2

## 2021-12-10 MED ORDER — AMLODIPINE BESYLATE 10 MG PO TABS
ORAL_TABLET | ORAL | Status: AC
  Filled 2021-12-10: qty 1

## 2021-12-10 MED ORDER — HYDRALAZINE HCL 25 MG PO TABS
ORAL_TABLET | ORAL | Status: AC
  Filled 2021-12-10: qty 1

## 2021-12-10 NOTE — Progress Notes (Signed)
Physical Therapy Treatment Patient Details Name: Ian [name] MRN: [id number] DOB: [birth year] Today's Date: 12/10/2021   History of Present Illness 86 y.o. male admitted with sepsis, Pna, AMS. History significant for Alzheimer's dementia, stage IV CKD, hypertension, depression, COPD, chronic anemia, hyperlipidemia, chronic diastolic heart failure    PT Comments    Progressing with mobility. Used RW on today for increased stability. O2 88% on RA at rest; 80% on RA while ambulating, 87% on 2L while ambulating. Recommend increased activity and continued pulm O2 sat assessment. Pt lives alone.    Recommendations for follow up therapy are one component of a multi-disciplinary discharge planning process, led by the attending physician.  Recommendations may be updated based on patient status, additional functional criteria and insurance authorization.  Follow Up Recommendations  Home health PT (provided pt continues to improve)     Assistance Recommended at Discharge Intermittent Supervision/Assistance  Patient can return home with the following A little help with walking and/or transfers;A little help with bathing/dressing/bathroom;Assist for transportation;Assistance with cooking/housework;Help with stairs or ramp for entrance   Equipment Recommendations  None recommended by PT    Recommendations for Other Services OT consult     Precautions / Restrictions Precautions Precautions: Fall Precaution Comments: monitor O2 Restrictions Weight Bearing Restrictions: No     Mobility  Bed Mobility               General bed mobility comments: oob in recliner    Transfers Overall transfer level: Needs assistance Equipment used: Rolling walker (2 wheels) Transfers: Sit to/from Stand Sit to Stand: Min assist           General transfer comment: Assist to steady. Cues for safety.    Ambulation/Gait Ambulation/Gait assistance: Min assist Gait Distance (Feet): 100 Feet Assistive  device: Rolling walker (2 wheels) Gait Pattern/deviations: Step-through pattern, Decreased stride length       General Gait Details: Intermittent unsteadiness-pt reports foot pain. O2 80% on RA, 87% 2L. Pt tolerated distance well.   Stairs             Wheelchair Mobility    Modified Rankin (Stroke Patients Only)       Balance Overall balance assessment: Needs assistance         Standing balance support: Bilateral upper extremity supported, During functional activity Standing balance-Leahy Scale: Poor                              Cognition Arousal/Alertness: Awake/alert Behavior During Therapy: WFL for tasks assessed/performed Overall Cognitive Status: Within Functional Limits for tasks assessed                                          Exercises      General Comments        Pertinent Vitals/Pain Pain Assessment Pain Assessment: Faces Faces Pain Scale: Hurts a little bit Pain Location: L foot Pain Descriptors / Indicators: Discomfort Pain Intervention(s): Monitored during session    Home Living                          Prior Function            PT Goals (current goals can now be found in the care plan section) Progress towards PT goals: Progressing toward goals    Frequency  Min 3X/week      PT Plan Current plan remains appropriate    Co-evaluation              AM-PAC PT "6 Clicks" Mobility   Outcome Measure  Help needed turning from your back to your side while in a flat bed without using bedrails?: None Help needed moving from lying on your back to sitting on the side of a flat bed without using bedrails?: None Help needed moving to and from a bed to a chair (including a wheelchair)?: A Little Help needed standing up from a chair using your arms (e.g., wheelchair or bedside chair)?: A Little Help needed to walk in hospital room?: A Little Help needed climbing 3-5 steps with a railing? :  A Little 6 Click Score: 20    End of Session Equipment Utilized During Treatment: Gait belt;Oxygen Activity Tolerance: Patient tolerated treatment well Patient left: in chair;with call bell/phone within reach;with chair alarm set   PT Visit Diagnosis: Muscle weakness (generalized) (M62.81);Difficulty in walking, not elsewhere classified (R26.2)     Time: 3582-5189 PT Time Calculation (min) (ACUTE ONLY): 20 min  Charges:  $Gait Training: 8-22 mins                         Doreatha Massed, PT Acute Rehabilitation  Office: [phone] Pager: [phone]

## 2021-12-10 NOTE — TOC Progression Note (Signed)
Transition of Care [facility]) - Progression Note    Patient Details  Name: Ian [name] MRN: [id number] Date of Birth: Nov 21, 1935  Transition of Care [facility]) CM/SW Contact  Purcell Mouton, RN Phone Number: 12/10/2021, 4:04 PM  Clinical Narrative:     TOC will continue to follow.   Expected Discharge Plan: [place] Barriers to Discharge: No Barriers Identified  Expected Discharge Plan and Services Expected Discharge Plan: [place] Choice: [place] arrangements for the past 2 months: Single Family Home                                       Social Determinants of Health (SDOH) Interventions    Readmission Risk Interventions     View : No data to display.

## 2021-12-10 NOTE — Progress Notes (Signed)
  Progress Note   Patient: Ian [name] GUR:[id number] DOB: September 23, 1935 DOA: 12/07/2021     3 DOS: the patient was seen and examined on 12/10/2021   Brief hospital course: 86 y.o. male with medical history significant for Alzheimer's dementia, stage IV CKD with baseline creatinine 2.9-3.0, hypertension, depression, COPD, chronic anemia with baseline hemoglobin 10-12, hyperlipidemia, chronic diastolic heart failure, of who is admitted to [facility] on 12/07/2021 by way of transfer from [facility] emergency department with severe sepsis due to community-acquired pneumonia after presenting from home to [facility] emergency department for further evaluation of altered mental status.   Pt was found to have concerns of CAP  Assessment and Plan: Severe sepsis with commune acquired pneumonia present on admission -Recently completed course of doxycycline as outpatient.  Chest imaging notable for airspace disease consistent with pneumonia.  Patient presented with leukocytosis, tachycardia, encephalopathy with renal failure. -Mentation seems much improved today.  Patient appears oriented and conversant. -Patient has been continued on azithromycin and Rocephin -Leukocytosis has normalized, currently 8.6 -Had required up to 4 L nasal cannula.  Patient reports being O2 nave at baseline.  Weaned to 1 L nasal cannula  Chronic anemia -Hemodynamically stable at the time -Hemoglobin stable -Cont to follow CBC trends  Acute toxic metabolic encephalopathy -Seems much improved conversing appropriately, oriented on exam -Cont with abx per above  Acute renal failure on stage IV CKD -Presenting creatinine 3.65, baseline of around 2.8 -Creatinine down to 2.68 -Recheck bmet in AM  Hypertension chronic diastolic congestive heart failure -Blood pressure appears stable at this time -Continue current regimen  Type 2 diabetes -Glycemic trends reviewed, stable -Continue SSI coverage as  needed  Hyperlipidemia -Continue statin per home regimen  COPD -Had required 4 L nasal cannula -We will continue patient on as needed albuterol and scheduled DuoNebs -No audible wheezing on exam this morning -Weaned to 1 L nasal cannula     Subjective: States feeling much better, eager to go home soon  Physical Exam: Vitals:   12/10/21 0850 12/10/21 0852 12/10/21 1200 12/10/21 1413  BP:   (!) 162/113   Pulse:   75   Resp:   20 (!) 21  Temp:   98.9 F (37.2 C)   TempSrc:   Oral   SpO2: 93% 93% 94% 94%  Weight:      Height:       General exam: Awake, laying in bed, in nad Respiratory system: Normal respiratory effort, no wheezing Cardiovascular system: regular rate, s1, s2 Gastrointestinal system: Soft, nondistended, positive BS Central nervous system: CN2-12 grossly intact, strength intact Extremities: Perfused, no clubbing Skin: Normal skin turgor, no notable skin lesions seen Psychiatry: Mood normal // no visual hallucinations   Data Reviewed:  Labs reviewed, potassium 4.4, creatinine 2.68, WBCs 8.6, hemoglobin 9.5  Family Communication:, Pt in room, Family not at bedside, discussed with patient's son over phone on 12/09/2021  Disposition: Status is: Inpatient Remains inpatient appropriate because: Severity of illness  Planned Discharge Destination: Home     Author: Marylu Lund, MD 12/10/2021 2:58 PM  For on call review [email or web address].

## 2021-12-11 ENCOUNTER — Other Ambulatory Visit: Payer: Self-pay | Admitting: Nurse Practitioner

## 2021-12-11 ENCOUNTER — Inpatient Hospital Stay (HOSPITAL_COMMUNITY): Payer: PPO

## 2021-12-11 DIAGNOSIS — A419 Sepsis, unspecified organism: Secondary | ICD-10-CM | POA: Diagnosis not present

## 2021-12-11 DIAGNOSIS — N1831 Chronic kidney disease, stage 3a: Secondary | ICD-10-CM

## 2021-12-11 DIAGNOSIS — R6520 Severe sepsis without septic shock: Secondary | ICD-10-CM | POA: Diagnosis not present

## 2021-12-11 LAB — GLUCOSE, CAPILLARY
Glucose-Capillary: 121 mg/dL — ABNORMAL HIGH (ref 70–99)
Glucose-Capillary: 115 mg/dL — ABNORMAL HIGH (ref 70–99)
Glucose-Capillary: 113 mg/dL — ABNORMAL HIGH (ref 70–99)
Glucose-Capillary: 130 mg/dL — ABNORMAL HIGH (ref 70–99)

## 2021-12-11 LAB — CBC
HCT: 31.8 % — ABNORMAL LOW (ref 39.0–52.0)
Hemoglobin: 10.6 g/dL — ABNORMAL LOW (ref 13.0–17.0)
MCH: 33.2 pg (ref 26.0–34.0)
MCHC: 33.3 g/dL (ref 30.0–36.0)
MCV: 99.7 fL (ref 80.0–100.0)
Platelets: 167 10*3/uL (ref 150–400)
RBC: 3.19 MIL/uL — ABNORMAL LOW (ref 4.22–5.81)
RDW: 13.6 % (ref 11.5–15.5)
WBC: 9.7 10*3/uL (ref 4.0–10.5)
nRBC: 0 % (ref 0.0–0.2)

## 2021-12-11 LAB — COMPREHENSIVE METABOLIC PANEL
ALT: 24 U/L (ref 0–44)
AST: 23 U/L (ref 15–41)
Albumin: 2.9 g/dL — ABNORMAL LOW (ref 3.5–5.0)
Alkaline Phosphatase: 58 U/L (ref 38–126)
Anion gap: 8 (ref 5–15)
BUN: 37 mg/dL — ABNORMAL HIGH (ref 8–23)
CO2: 23 mmol/L (ref 22–32)
Calcium: 10.2 mg/dL (ref 8.9–10.3)
Chloride: 110 mmol/L (ref 98–111)
Creatinine, Ser: 2.72 mg/dL — ABNORMAL HIGH (ref 0.61–1.24)
GFR, Estimated: 22 mL/min — ABNORMAL LOW (ref >60)
Glucose, Bld: 112 mg/dL — ABNORMAL HIGH (ref 70–99)
Potassium: 4.5 mmol/L (ref 3.5–5.1)
Sodium: 141 mmol/L (ref 135–145)
Total Bilirubin: 0.7 mg/dL (ref 0.3–1.2)
Total Protein: 6.3 g/dL — ABNORMAL LOW (ref 6.5–8.1)

## 2021-12-11 MED ORDER — ENSURE ENLIVE PO LIQD
237.0000 mL | ORAL | Status: DC
  Administered 2021-12-11 – 2021-12-12 (×2): 237 mL via ORAL

## 2021-12-11 MED ORDER — BOOST / RESOURCE BREEZE PO LIQD CUSTOM
1.0000 | ORAL | Status: DC
  Administered 2021-12-12 – 2021-12-14 (×2): 1 via ORAL

## 2021-12-11 MED ORDER — POLYETHYLENE GLYCOL 3350 17 G PO PACK
17.0000 g | PACK | Freq: Every day | ORAL | Status: DC | PRN
  Administered 2021-12-11: 17 g via ORAL
  Filled 2021-12-11: qty 1

## 2021-12-11 NOTE — Plan of Care (Signed)

## 2021-12-11 NOTE — Progress Notes (Signed)
  Progress Note   Patient: Ian [name] [id number] DOB: [birth year] DOA: 12/07/2021     4 DOS: the patient was seen and examined on 12/11/2021   Brief hospital course: 86 y.o. male with medical history significant for Alzheimer's dementia, stage IV CKD with baseline creatinine 2.9-3.0, hypertension, depression, COPD, chronic anemia with baseline hemoglobin 10-12, hyperlipidemia, chronic diastolic heart failure, of who is admitted to [facility] on 12/07/2021 by way of transfer from [facility] emergency department with severe sepsis due to community-acquired pneumonia after presenting from home to [facility] emergency department for further evaluation of altered mental status.   Pt was found to have concerns of CAP  Assessment and Plan: Severe sepsis with commune acquired pneumonia present on admission -Recently completed course of doxycycline as outpatient.  Chest imaging notable for airspace disease consistent with pneumonia.  Patient presented with leukocytosis, tachycardia, encephalopathy with renal failure. -Mentation now seems much improved.  Patient appears oriented and conversant. -Patient has been continued on azithromycin and Rocephin -Leukocytosis has normalized -Had initially required up to 4 L nasal cannula.  Patient reports being O2 nave at baseline. Weaning O2, still requiring 2LNC. Wean as tolerated to goal of RA  Chronic anemia -Hemodynamically stable at the time -Hemoglobin remains stable -Cont to follow CBC trends  Acute toxic metabolic encephalopathy -Seems much improved conversing appropriately, oriented on exam -Cont with abx per above  Acute renal failure on stage IV CKD -Presenting creatinine 3.65, baseline of around 2.8 -Creatinine stable at 2.72 -Recheck bmet in AM  Hypertension chronic diastolic congestive heart failure -Blood pressure appears stable at this time -Continue current regimen  Type 2 diabetes -Glycemic trends reviewed, stable -Continue  SSI coverage as needed  Hyperlipidemia -Continue statin per home regimen  COPD -Had required 4 L nasal cannula -We will continue patient on as needed albuterol and scheduled DuoNebs -Lungs sound clear w/o wheezing -Cont to wean O2 with goal of RA, currently down to [facility]     Subjective: States feeling much better, eager to go home soon  Physical Exam: Vitals:   12/11/21 0910 12/11/21 1343 12/11/21 1507 12/11/21 1605  BP:  (!) 160/80 (!) 162/87 (!) 148/60  Pulse:  78 66 69  Resp:  (!) 23  (!) 22  Temp:  98.7 F (37.1 C)  98.4 F (36.9 C)  TempSrc:  Oral  Oral  SpO2: 92% 93%  94%  Weight:      Height:       General exam: Conversant, in no acute distress Respiratory system: normal chest rise, clear, no audible wheezing Cardiovascular system: regular rhythm, s1-s2 Gastrointestinal system: Nondistended, nontender, pos BS Central nervous system: No seizures, no tremors Extremities: No cyanosis, no joint deformities Skin: No rashes, no pallor Psychiatry: Affect normal // no auditory hallucinations   Data Reviewed:  Labs reviewed, potassium 4.5, creatinine 2.72, WBC 9.7, hemoglobin 10.6  Family Communication:, Pt in room, Family not at bedside, discussed with patient's son over phone on 12/09/2021  Disposition: Status is: Inpatient Remains inpatient appropriate because: Severity of illness  Planned Discharge Destination: Home     Author: Marylu Lund, MD 12/11/2021 4:45 PM  For on call review [email or web address].

## 2021-12-11 NOTE — Progress Notes (Signed)
Initial Nutrition Assessment  DOCUMENTATION CODES:   Not applicable  INTERVENTION:  - will order Boost Breeze once/day each supplement provides 250 kcal and 9 grams of protein. - will order Ensure Plus High Protein once/day, each supplement provides 350 kcal and 20 grams of protein. - will complete NFPE when feasible.    NUTRITION DIAGNOSIS:   Increased nutrient needs related to acute illness as evidenced by estimated needs.  GOAL:   Patient will meet greater than or equal to 90% of their needs  MONITOR:   PO intake, Supplement acceptance, Labs, Weight trends  REASON FOR ASSESSMENT:   Malnutrition Screening Tool  ASSESSMENT:   86 y.o. male with medical history of Alzheimer's dementia, stage 4 CKD, HTN, depression, COPD, chronic anemia, HLD, CHF, DM, depression, and sleep apnea. He presented to the ED due to AMS and was admitted for severe sepsis due to CAP.  Patient sleeping with no visitors present at the time of RD visit. Review of flow sheet indicates he has been eating 100% at meals since breakfast on 6/3.  He has not been seen by a [facility] RD since 04/2018.  Weight today is 158 lb and weight on 5/23 was 169 lb. This indicates 11 lb weight loss (6.5% body weight) in the past 2 weeks. Will monitor weight trends due to high risk for malnutrition.    Labs reviewed; CBGs: 121 and 115 mg/dl, BUN: 37 mg/dl, creatinine: 2.72 mg/dl, GFR: 22 ml/min.  Medications reviewed; sliding scale novolog.    NUTRITION - FOCUSED PHYSICAL EXAM:  Unable.  Diet Order:   Diet Order             Diet Carb Modified Fluid consistency: Thin; Room service appropriate? Yes  Diet effective now                   EDUCATION NEEDS:   No education needs have been identified at this time  Skin:  Skin Assessment: Reviewed RN Assessment  Last BM:  PTA/unknown  Height:   Ht Readings from Last 1 Encounters:  12/08/21 5\' 7"  (1.702 m)    Weight:   Wt Readings from Last 1  Encounters:  12/11/21 71.5 kg    BMI:  Body mass index is 24.68 kg/m.  Estimated Nutritional Needs:  Kcal:  1800-2000 kcal Protein:  90-100 grams Fluid:  >/= 2 L/day     Jarome Matin, MS, RD, LDN Registered Dietitian II Inpatient Clinical Nutrition RD pager # and on-call/weekend pager # available in [facility]

## 2021-12-11 NOTE — Progress Notes (Signed)
Occupational Therapy Treatment Patient Details Name: Ian [name] MRN: [id number] DOB: 04-21-1936 Today's Date: 12/11/2021   History of present illness 86 y.o. male admitted with sepsis, Pna, AMS. History significant for Alzheimer's dementia, stage IV CKD, hypertension, depression, COPD, chronic anemia, hyperlipidemia, chronic diastolic heart failure   OT comments  Patient was noted to have had a significant change in ability to engage in ADLs on this date. Patient was noted to have new onset of pain in R lateral aspect of foot with mild edema noted near body prominence of lateral aspect that is very tender to touch. Patient was self limiting WB with reports of 10/10 pain with movement and attempted standing. Patient was unable to WB through BUE and maintain balance safely. Patient was positioned in bed with RLE elevated with nurse made aware of change in pain levels. Pending source of pain patients d/c plan might need to be updated. Per PT note on 6/5 patient was walking 100 feet. OT to continue to follow acutely.    Recommendations for follow up therapy are one component of a multi-disciplinary discharge planning process, led by the attending physician.  Recommendations may be updated based on patient status, additional functional criteria and insurance authorization.    Follow Up Recommendations  Home health OT    Assistance Recommended at Discharge PRN  Patient can return home with the following  A little help with walking and/or transfers;A little help with bathing/dressing/bathroom;Assistance with cooking/housework;Direct supervision/assist for financial management;Direct supervision/assist for medications management   Equipment Recommendations       Recommendations for Other Services      Precautions / Restrictions Precautions Precautions: Fall Precaution Comments: monitor O2 Restrictions Weight Bearing Restrictions: No       Mobility Bed Mobility Overal bed mobility: Needs  Assistance Bed Mobility: Supine to Sit, Sit to Supine     Supine to sit: Supervision Sit to supine: Supervision   General bed mobility comments: with increased reports of pain with movement of R foot/ankle    Transfers                         Balance Overall balance assessment: Needs assistance Sitting-balance support: No upper extremity supported, Feet supported Sitting balance-Leahy Scale: Good     Standing balance support: Bilateral upper extremity supported, During functional activity Standing balance-Leahy Scale: Poor Standing balance comment: unable to balance in standing with self limting WB on RLE                           ADL either performed or assessed with clinical judgement   ADL Overall ADL's : Needs assistance/impaired                                       General ADL Comments: patient in attempts to transfer from edge of bed to recliner with RW patient was maX A with patient self limitng WB on RLE with reports of 10/10 pain with foot touching floor and attempted WB. patient stopped and retuned to bed with RLE elevated nurse made aware.    Extremity/Trunk Assessment     Lower Extremity Assessment Lower Extremity Assessment: RLE deficits/detail RLE Deficits / Details: noted to have increased pain with some noted edema lateral aspect of R foot that was tender to touch near body prominence. nurse made aware.  Vision       Perception     Praxis      Cognition Arousal/Alertness: Awake/alert Behavior During Therapy: WFL for tasks assessed/performed Overall Cognitive Status: Within Functional Limits for tasks assessed                                 General Comments: patient was alert and oriented,        Exercises      Shoulder Instructions       General Comments      Pertinent Vitals/ Pain       Pain Assessment Pain Assessment: 0-10 Pain Score: 10-Worst pain ever Pain Location: R  lateral foot Pain Descriptors / Indicators: Grimacing, Guarding, Throbbing Pain Intervention(s): Monitored during session, Repositioned, Patient requesting pain meds-RN notified, Limited activity within patient's tolerance  Home Living                                          Prior Functioning/Environment              Frequency  Min 2X/week        Progress Toward Goals  OT Goals(current goals can now be found in the care plan section)  Progress towards OT goals: Not progressing toward goals - comment (new onset of pain in R ankle/foot)     Plan      Co-evaluation                 AM-PAC OT "6 Clicks" Daily Activity     Outcome Measure   Help from another person eating meals?: None Help from another person taking care of personal grooming?: A Little Help from another person toileting, which includes using toliet, bedpan, or urinal?: A Lot Help from another person bathing (including washing, rinsing, drying)?: A Little Help from another person to put on and taking off regular upper body clothing?: A Little Help from another person to put on and taking off regular lower body clothing?: A Little 6 Click Score: 18    End of Session Equipment Utilized During Treatment: Rolling walker (2 wheels)  OT Visit Diagnosis: Unsteadiness on feet (R26.81);Muscle weakness (generalized) (M62.81)   Activity Tolerance Patient limited by pain   Patient Left with call bell/phone within reach;in bed;with bed alarm set   Nurse Communication Other (comment) (patients self limiting WB on RLE)        Time: 3244-0102 OT Time Calculation (min): 22 min  Charges: OT General Charges $OT Visit: 1 Visit OT Treatments $Self Care/Home Management : 8-22 mins  Jackelyn Poling OTR/L, MS Acute Rehabilitation Department Office# [phone] Pager# [phone]   Marcellina Millin 12/11/2021, 9:06 AM

## 2021-12-12 ENCOUNTER — Inpatient Hospital Stay (HOSPITAL_COMMUNITY): Payer: PPO

## 2021-12-12 DIAGNOSIS — R6520 Severe sepsis without septic shock: Secondary | ICD-10-CM | POA: Diagnosis not present

## 2021-12-12 DIAGNOSIS — M79604 Pain in right leg: Secondary | ICD-10-CM

## 2021-12-12 DIAGNOSIS — A419 Sepsis, unspecified organism: Secondary | ICD-10-CM | POA: Diagnosis not present

## 2021-12-12 LAB — GLUCOSE, CAPILLARY
Glucose-Capillary: 114 mg/dL — ABNORMAL HIGH (ref 70–99)
Glucose-Capillary: 143 mg/dL — ABNORMAL HIGH (ref 70–99)
Glucose-Capillary: 129 mg/dL — ABNORMAL HIGH (ref 70–99)
Glucose-Capillary: 120 mg/dL — ABNORMAL HIGH (ref 70–99)

## 2021-12-12 LAB — CULTURE, BLOOD (ROUTINE X 2)
Culture: NO GROWTH
Special Requests: ADEQUATE

## 2021-12-12 MED ORDER — SENNOSIDES-DOCUSATE SODIUM 8.6-50 MG PO TABS
1.0000 | ORAL_TABLET | Freq: Two times a day (BID) | ORAL | Status: DC
  Administered 2021-12-12 – 2021-12-14 (×5): 1 via ORAL
  Filled 2021-12-12 (×6): qty 1

## 2021-12-12 MED ORDER — LIDOCAINE 5 % EX PTCH
1.0000 | MEDICATED_PATCH | CUTANEOUS | Status: DC
  Administered 2021-12-12 – 2021-12-13 (×2): 1 via TRANSDERMAL
  Filled 2021-12-12 (×3): qty 1

## 2021-12-12 NOTE — Progress Notes (Signed)
Physical Therapy Treatment Patient Details Name: Ian [name] MRN: [id number] DOB: Mar 13, 1936 Today's Date: 12/12/2021   SATURATION QUALIFICATIONS: (This note is used to comply with regulatory documentation for home oxygen)  Patient Saturations on Room Air at Rest = 86%  Patient Saturations on [employer] while Ambulating = n/a  Patient Saturations on 2 Liters of oxygen with activity = 88%     History of Present Illness 86 y.o. male admitted with sepsis, Pna, AMS. History significant for Alzheimer's dementia, stage IV CKD, hypertension, depression, COPD, chronic anemia, hyperlipidemia, chronic diastolic heart failure    PT Comments    Pt agreeable to working with PT. Unfortunately, pt is unable to ambulate (barely able to stand) 2* R LE pain (from knee down to foot). Attempted x 2. Assisted pt back to bed. PT recommendation has been updated to ST SNF at this time. FYI: pt is adamant about returning home and he lives alone. Will continue to follow and progress activity as tolerated. If pain control/mobility improves, and pt is able to arrange for some in home help from his family, he may be able to return home with HHPT.     Recommendations for follow up therapy are one component of a multi-disciplinary discharge planning process, led by the attending physician.  Recommendations may be updated based on patient status, additional functional criteria and insurance authorization.  Follow Up Recommendations  Skilled nursing-short term rehab (<3 hours/day) (unless mobility improves. However, pt is adamant about going home)     Assistance Recommended at Discharge Frequent or constant Supervision/Assistance  Patient can return home with the following A little help with walking and/or transfers;A little help with bathing/dressing/bathroom;Assist for transportation;Assistance with cooking/housework;Help with stairs or ramp for entrance   Equipment Recommendations  None recommended by PT     Recommendations for Other Services OT consult     Precautions / Restrictions Precautions Precautions: Fall Precaution Comments: monitor O2 Restrictions Weight Bearing Restrictions: No     Mobility  Bed Mobility Overal bed mobility: Needs Assistance Bed Mobility: Supine to Sit     Supine to sit: Supervision, HOB elevated Sit to supine: Supervision, HOB elevated   General bed mobility comments: Supv for safety, lines.    Transfers Overall transfer level: Needs assistance Equipment used: Rolling walker (2 wheels) Transfers: Sit to/from Stand Sit to Stand: Min assist, From elevated surface           General transfer comment: Assist to rise, steady, control descent. Attempted steps x 2-pt unable. He was barely able to tolerate placing his foot on the floor.    Ambulation/Gait               General Gait Details: Pt unable 2* pain today   Stairs             Wheelchair Mobility    Modified Rankin (Stroke Patients Only)       Balance Overall balance assessment: Needs assistance         Standing balance support: Bilateral upper extremity supported, During functional activity Standing balance-Leahy Scale: Poor                              Cognition Arousal/Alertness: Awake/alert Behavior During Therapy: WFL for tasks assessed/performed Overall Cognitive Status: Within Functional Limits for tasks assessed  Exercises      General Comments        Pertinent Vitals/Pain Pain Assessment Pain Assessment: Faces Faces Pain Scale: Hurts whole lot Pain Location: R LE-knee down to foot Pain Descriptors / Indicators: Grimacing, Guarding, Throbbing Pain Intervention(s): Limited activity within patient's tolerance, Monitored during session, Repositioned, Ice applied    Home Living                          Prior Function            PT Goals (current goals can now  be found in the care plan section) Progress towards PT goals: Not progressing toward goals - comment (2* pain)    Frequency    Min 3X/week      PT Plan Current plan remains appropriate    Co-evaluation              AM-PAC PT "6 Clicks" Mobility   Outcome Measure  Help needed turning from your back to your side while in a flat bed without using bedrails?: None Help needed moving from lying on your back to sitting on the side of a flat bed without using bedrails?: None Help needed moving to and from a bed to a chair (including a wheelchair)?: Total Help needed standing up from a chair using your arms (e.g., wheelchair or bedside chair)?: A Little Help needed to walk in hospital room?: Total Help needed climbing 3-5 steps with a railing? : Total 6 Click Score: 14    End of Session Equipment Utilized During Treatment: Gait belt;Oxygen Activity Tolerance: Patient limited by pain Patient left: in bed;with call bell/phone within reach;with bed alarm set   PT Visit Diagnosis: Muscle weakness (generalized) (M62.81);Difficulty in walking, not elsewhere classified (R26.2)     Time: 1000-1027 PT Time Calculation (min) (ACUTE ONLY): 27 min  Charges:  $Therapeutic Activity: 23-37 mins                         Doreatha Massed, PT Acute Rehabilitation  Office: [phone] Pager: [phone]

## 2021-12-12 NOTE — TOC Progression Note (Signed)
Transition of Care [facility]) - Progression Note   Patient Details  Name: Landin Tallon MRN: [id number] Date of Birth: [birth year]  [facility]) CM/SW [place], LCSW Phone Number: 12/12/2021, 3:26 PM  Clinical Narrative: PT evaluation is now recommending SNF due to a decline in function. Patient and son are agreeable to SNF referral. FL2 done; PASRR verified. Initial referral faxed out. CSW called [facility] to start insurance authorization. TOC awaiting bed offers.  Expected Discharge Plan: Skilled Nursing Facility Barriers to Discharge: SNF Pending bed offer, Ship broker, Continued Medical Work up  Expected Discharge Plan and Services Expected Discharge Plan: [place] In-house Referral: Clinical Social Work Post Acute Care Choice: [place] Living arrangements for the past 2 months: Single Family Home           DME Arranged: N/A DME Agency: NA  Readmission Risk Interventions     View : No data to display.

## 2021-12-12 NOTE — Progress Notes (Signed)
PROGRESS NOTE    Ian [name]  TFT:[id number] DOB: 11/24/1935 DOA: 12/07/2021 PCP: Lauree Chandler, NP     Brief Narrative:  86 y.o. male with medical history significant for Alzheimer's dementia, stage IV CKD with baseline creatinine 2.9-3.0, hypertension, depression, COPD, chronic anemia with baseline hemoglobin 10-12, hyperlipidemia, chronic diastolic heart failure, of who is admitted to [facility] on 12/07/2021 by way of transfer from [facility] emergency department with severe sepsis due to community-acquired pneumonia after presenting from home to [facility] emergency department for further evaluation of altered mental status.   Pt was found to have concerns of CAP   Subjective:  Currently on 2liter oxygen, has intermittent weak cough C/o right knee, right ankle, right foot pain, reports no pain prior to coming to the hospital, think it is due to scd's He is aaox3, but appear confused Son at bedside, reports patient appears to be more confused than normal Son agrees with snf placement, per son reports patient is a lot weaker than he normally is.   Assessment & Plan:  Principal Problem:   Severe sepsis ([place]) Active Problems:   Allergic rhinitis   COPD (chronic obstructive pulmonary disease) (HCC)   Hyperlipidemia   DM2 (diabetes mellitus, type 2) (HCC)   Chronic diastolic CHF (congestive heart failure) (HCC)   HTN (hypertension)   Depression with anxiety   CAP (community acquired pneumonia)   Acute renal failure superimposed on stage 4 chronic kidney disease (HCC)   Acute metabolic encephalopathy   Anemia, unspecified    Assessment and Plan:  Sepsis with community-acquired pneumonia, acute hypoxic respiratory failure ,acute metabolic encephalopathy present on admission - Patient presented with leukocytosis, tachycardia, encephalopathy with renal failure.  Symptoms did not improve with outpatient doxycycline -Encephalopathy has improved ,he is alert oriented x3  today but still confused not back to baseline yet per son -He initially required 4 L oxygen supplement, today down to 2 L, WBC normalized, tachycardia resolved, plan to finish total 5 days Rocephin and Zithromax -Repeat chest x-ray, wean oxygen -Need to repeat chest x-ray in 6 to 8 weeks postdischarge  AKI on CKD IV/anemia of chronic disease -Creatinine on presentation was 3.65, creatinine improved today is 2.72 close to baseline -Hemoglobin close to baseline around 10 -Continue monitor renal function, renal dosing meds  Hypertension/chronic diastolic CHF -Blood pressure stable on current regimen   Diet-controlled diabetes Blood glucose stable  Hyperlipidemia -Continue statin  COPD No wheezing  Right leg pain/ right knee pain, right ankle and foot pain -not able to bear weight -Reports started after scd use,  -right foot x ray no acute findings except small Achilles and calcaneal spurs, mild hammer toe deformities, vascular calcifications -will get right knee/ankle x ray and venous doppler, check uric acid  FTT, Pt recommend SNF, patient and son in agreement  Nutritional Assessment: The patient's BMI is: Body mass index is 24.96 kg/m.Marland Kitchen Seen by dietician.  I agree with the assessment and plan as outlined below: Nutrition Status: Nutrition Problem: Increased nutrient needs Etiology: acute illness Signs/Symptoms: estimated needs Interventions: Boost Breeze, Ensure Enlive (each supplement provides 350kcal and 20 grams of protein)  .   I have Reviewed nursing notes, Vitals, pain scores, I/o's, Lab results and  imaging results since pt's last encounter, details please see discussion above  I ordered the following labs:  Unresulted Labs (From admission, onward)     Start     Ordered   12/13/21 0500  Uric acid  Tomorrow morning,   R  Question:  Specimen collection method  Answer:  Lab=Lab collect   12/12/21 1326   12/13/21 0321  Basic metabolic panel  Tomorrow  morning,   R       Question:  Specimen collection method  Answer:  Lab=Lab collect   12/12/21 1326             DVT prophylaxis: SCDs Start: 12/07/21 2222   Code Status:   Code Status: DNR  Family Communication: Son at the bedside Disposition:   Dispo: The patient is from: Home, lives alone              Anticipated d/c is to: SNF              Anticipated d/c date is: 24-48hrs  Antimicrobials:    Anti-infectives (From admission, onward)    Start     Dose/Rate Route Frequency Ordered Stop   12/08/21 1800  azithromycin (ZITHROMAX) 500 mg in sodium chloride 0.9 % 250 mL IVPB        500 mg 250 mL/hr over 60 Minutes Intravenous Every 24 hours 12/07/21 2243 12/13/21 1759   12/08/21 1800  cefTRIAXone (ROCEPHIN) 1 g in sodium chloride 0.9 % 100 mL IVPB        1 g 200 mL/hr over 30 Minutes Intravenous Every 24 hours 12/07/21 2243 12/13/21 1759   12/07/21 1715  cefTRIAXone (ROCEPHIN) 1 g in sodium chloride 0.9 % 100 mL IVPB        1 g 200 mL/hr over 30 Minutes Intravenous  Once 12/07/21 1701 12/07/21 1855   12/07/21 1715  azithromycin (ZITHROMAX) tablet 500 mg        500 mg Oral  Once 12/07/21 1701 12/07/21 1750          Objective: Vitals:   12/12/21 0729 12/12/21 1114 12/12/21 1117 12/12/21 1357  BP:  (!) 152/64  138/62  Pulse:  (!) 56  63  Resp:  16  14  Temp:    98.5 F (36.9 C)  TempSrc:    Oral  SpO2: 92% 96% 91% 94%  Weight:      Height:        Intake/Output Summary (Last 24 hours) at 12/12/2021 1716 Last data filed at 12/12/2021 1648 Gross per 24 hour  Intake 1682.38 ml  Output 1400 ml  Net 282.38 ml   Filed Weights   12/10/21 0500 12/11/21 0534 12/12/21 0550  Weight: 73.9 kg 71.5 kg 72.3 kg    Examination:  General exam: Appear weak, AAOx3, though does appear confused Respiratory system: Diminished, no wheezing, no rales, no rhonchi, respiratory effort normal. Cardiovascular system:  RRR.  Gastrointestinal system: Abdomen is nondistended, soft and  nontender.  Normal bowel sounds heard. Central nervous system: Alert and orientedx3. No focal neurological deficits. Extremities:  no edema Skin: No rashes, lesions or ulcers Psychiatry: Calm and cooperative, does appear confused    Data Reviewed: I have personally reviewed  labs and visualized  imaging studies since the last encounter and formulate the plan        Scheduled Meds:  amLODipine  10 mg Oral Daily   aspirin EC  81 mg Oral Daily   atorvastatin  80 mg Oral Daily   feeding supplement  1 Container Oral Q24H   feeding supplement  237 mL Oral Q24H   fluticasone  1 spray Each Nare Daily   gatifloxacin  1 drop Right Eye BID   hydrALAZINE  25 mg Oral BID   insulin aspart  0-6  Units Subcutaneous TID WC   ipratropium-albuterol  3 mL Nebulization TID   lidocaine  1 patch Transdermal Q24H   mouth rinse  15 mL Mouth Rinse BID   metoprolol tartrate  50 mg Oral BID   montelukast  10 mg Oral QHS   PARoxetine  40 mg Oral Daily   senna-docusate  1 tablet Oral BID   Continuous Infusions:  sodium chloride 10 mL/hr at 12/10/21 1800   azithromycin 500 mg (12/11/21 1751)   cefTRIAXone (ROCEPHIN)  IV 1 g (12/11/21 1643)     LOS: 5 days    Florencia Reasons, MD PhD FACP [facility]  Available via Epic secure chat 7am-7pm for nonurgent issues Please page for urgent issues To page the attending provider between 7A-7P or the covering provider during after hours 7P-7A, please log into the web site [email or web address] and access using universal [place] password for that web site. If you do not have the password, please call the hospital operator.    12/12/2021, 5:16 PM

## 2021-12-12 NOTE — Progress Notes (Signed)
Orthopedic Tech Progress Note Patient Details:  Ian [name] 1935/09/27 [id number]  Patient ID: Ian [name], male   DOB: 1935-07-26, 86 y.o.   MRN: [id number]  Ian [name] 12/12/2021, 5:10 PM Right knee sleeve applied

## 2021-12-12 NOTE — NC FL2 (Signed)
[place] LEVEL OF CARE SCREENING TOOL     IDENTIFICATION  Patient Name: Ian [name] Birthdate: [birth year] Sex: male Admission Date (Current Location): 12/07/2021  [facility] and [place] Number:  Herbalist and Address:  [facility],  [place], [place]      Provider Number: [id number]  Attending Physician Name and Address:  Florencia Reasons, MD  Relative Name and Phone Number:  [name] Ph: [phone]    Current Level of Care: Hospital Recommended Level of Care: [place] Prior Approval Number:    Date Approved/Denied:   PASRR Number: [id number]  Discharge Plan: SNF    Current Diagnoses: Patient Active Problem List   Diagnosis Date Noted   CAP (community acquired pneumonia) 12/07/2021   Severe sepsis ([place]) 12/07/2021   Acute renal failure superimposed on stage 4 chronic kidney disease ([name]) 86/76/1950   Acute metabolic encephalopathy 93/26/7124   Anemia, unspecified    Nasal congestion 12/18/2018   Depression with anxiety 12/18/2018   Chronic diastolic CHF (congestive heart failure) ([place]) 04/07/2018   HTN (hypertension) 04/07/2018   Chronic right-sided low back pain with right-sided sciatica 10/06/2017   SBO (small bowel obstruction) ([place]) 12/31/2016   Mild cognitive impairment with memory loss 11/04/2016   Chronic kidney disease (CKD) stage G3a/A2, moderately decreased glomerular filtration rate (GFR) between 45-59 mL/min/1.73 square meter and albuminuria creatinine ratio between 30-299 mg/g ([name]) 11/23/2015   Loss of weight 11/23/2015   Obesity (BMI 30-39.9) 02/03/2014   Chronic kidney disease, stage 4 (severe) ([name]) 10/20/2013   Hyperlipidemia associated with type 2 diabetes mellitus ([name]) 09/17/2013   Insomnia 01/07/2013   DM2 (diabetes mellitus, type 2) ([place]) 12/29/2012   Hypertension associated with diabetes ([place])    COPD (chronic obstructive pulmonary disease) ([place])    Hyperlipidemia     Degenerative drusen 07/07/2012   COPD with asthma ([place]) 12/03/2011   Allergic rhinitis 12/03/2011   Branch retinal vein occlusion 06/28/2011   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    Orientation RESPIRATION BLADDER Height & Weight     Self, Time, Situation, Place  O2 (2L/min PRN) Incontinent Weight: 159 lb 6.3 oz (72.3 kg) Height:  5\' 7"  (170.2 cm)  BEHAVIORAL SYMPTOMS/MOOD NEUROLOGICAL BOWEL NUTRITION STATUS   (N/A)  (N/A) Continent Diet (Carb modified diet)  AMBULATORY STATUS COMMUNICATION OF NEEDS Skin   Extensive Assist Verbally Other (Comment) (Erythema: upper buttocks; Ecchymosis: bilateral arms, legs)                       Personal Care Assistance Level of Assistance  Bathing, Feeding, Dressing Bathing Assistance: Limited assistance Feeding assistance: Independent Dressing Assistance: Limited assistance     Functional Limitations Info  Sight, Hearing, Speech Sight Info: Impaired Hearing Info: Impaired Speech Info: Adequate    SPECIAL CARE FACTORS FREQUENCY  PT (By licensed PT), OT (By licensed OT)     PT Frequency: 5x's/week OT Frequency: 5x's/week            Contractures Contractures Info: Not present    Additional Factors Info  Code Status, Allergies, Insulin Sliding Scale, Psychotropic Code Status Info: DNR Allergies Info: Ace Inhibitors Psychotropic Info: Paxil Insulin Sliding Scale Info: See discharge summary       Current Medications (12/12/2021):  This is the current hospital active medication list Current Facility-Administered Medications  Medication Dose Route Frequency Provider Last Rate Last Admin   0.9 %  sodium chloride infusion   Intravenous PRN Donne Hazel,  MD 10 mL/hr at 12/10/21 1800 Infusion Verify at 12/10/21 1800   acetaminophen (TYLENOL) tablet 650 mg  650 mg Oral Q6H PRN Howerter, Justin B, DO   650 mg at 12/12/21 1116   Or   acetaminophen (TYLENOL) suppository 650 mg  650 mg Rectal Q6H PRN Howerter, Justin B, DO       albuterol  (PROVENTIL) (2.5 MG/3ML) 0.083% nebulizer solution 2.5 mg  2.5 mg Nebulization Q4H PRN Howerter, Justin B, DO       amLODipine (NORVASC) tablet 10 mg  10 mg Oral Daily Donne Hazel, MD   10 mg at 12/12/21 0845   aspirin EC tablet 81 mg  81 mg Oral Daily Howerter, Justin B, DO   81 mg at 12/12/21 0846   atorvastatin (LIPITOR) tablet 80 mg  80 mg Oral Daily Donne Hazel, MD   80 mg at 12/11/21 1759   azithromycin (ZITHROMAX) 500 mg in sodium chloride 0.9 % 250 mL IVPB  500 mg Intravenous Q24H Florencia Reasons, MD 250 mL/hr at 12/11/21 1751 500 mg at 12/11/21 1751   cefTRIAXone (ROCEPHIN) 1 g in sodium chloride 0.9 % 100 mL IVPB  1 g Intravenous Q24H Florencia Reasons, MD 200 mL/hr at 12/11/21 1643 1 g at 12/11/21 1643   feeding supplement (BOOST / RESOURCE BREEZE) liquid 1 Container  1 Container Oral Q24H Donne Hazel, MD   1 Container at 12/12/21 0849   feeding supplement (ENSURE ENLIVE / ENSURE PLUS) liquid 237 mL  237 mL Oral Q24H Donne Hazel, MD   237 mL at 12/11/21 1759   fluticasone (FLONASE) 50 MCG/ACT nasal spray 1 spray  1 spray Each Nare Daily Howerter, Justin B, DO   1 spray at 12/12/21 0849   gatifloxacin (ZYMAXID) 0.5 % ophthalmic drops 1 drop  1 drop Right Eye BID Howerter, Justin B, DO   1 drop at 12/12/21 0851   guaiFENesin (MUCINEX) 12 hr tablet 1,200 mg  1,200 mg Oral BID PRN Howerter, Justin B, DO       hydrALAZINE (APRESOLINE) injection 10 mg  10 mg Intravenous Q4H PRN Donne Hazel, MD   10 mg at 12/12/21 0551   hydrALAZINE (APRESOLINE) tablet 25 mg  25 mg Oral BID Donne Hazel, MD   25 mg at 12/12/21 1116   insulin aspart (novoLOG) injection 0-6 Units  0-6 Units Subcutaneous TID WC Howerter, Justin B, DO   1 Units at 12/10/21 1237   ipratropium-albuterol (DUONEB) 0.5-2.5 (3) MG/3ML nebulizer solution 3 mL  3 mL Nebulization TID Donne Hazel, MD   3 mL at 12/12/21 0729   lidocaine (LIDODERM) 5 % 1 patch  1 patch Transdermal Q24H Florencia Reasons, MD       MEDLINE mouth rinse  15 mL  Mouth Rinse BID Donne Hazel, MD   15 mL at 12/12/21 0848   metoprolol tartrate (LOPRESSOR) tablet 50 mg  50 mg Oral BID Donne Hazel, MD   50 mg at 12/12/21 0846   montelukast (SINGULAIR) tablet 10 mg  10 mg Oral QHS Howerter, Justin B, DO   10 mg at 12/11/21 2054   PARoxetine (PAXIL) tablet 40 mg  40 mg Oral Daily Donne Hazel, MD   40 mg at 12/12/21 0847   polyethylene glycol (MIRALAX / GLYCOLAX) packet 17 g  17 g Oral Daily PRN Donne Hazel, MD   17 g at 12/11/21 1403   senna-docusate (Senokot-S) tablet 1 tablet  1 tablet Oral  BID Florencia Reasons, MD         Discharge Medications: Please see discharge summary for a list of discharge medications.  Relevant Imaging Results:  Relevant Lab Results:   Additional Information SSN: [id number]  Sherie Don, LCSW

## 2021-12-12 NOTE — Progress Notes (Signed)
Right lower extremity venous duplex has been completed. Preliminary results can be found in CV Proc through chart review.  Results were given to Dr. Erlinda Hong.  12/12/21 1:58 PM Ian [name] RVT

## 2021-12-13 ENCOUNTER — Inpatient Hospital Stay (HOSPITAL_COMMUNITY): Payer: PPO

## 2021-12-13 DIAGNOSIS — R6520 Severe sepsis without septic shock: Secondary | ICD-10-CM | POA: Diagnosis not present

## 2021-12-13 DIAGNOSIS — A419 Sepsis, unspecified organism: Secondary | ICD-10-CM | POA: Diagnosis not present

## 2021-12-13 DIAGNOSIS — I5032 Chronic diastolic (congestive) heart failure: Secondary | ICD-10-CM

## 2021-12-13 LAB — CULTURE, BLOOD (ROUTINE X 2)
Culture: NO GROWTH
Special Requests: ADEQUATE

## 2021-12-13 LAB — ECHOCARDIOGRAM COMPLETE
AR max vel: 1.13 cm2
AV Area VTI: 1.32 cm2
AV Area mean vel: 1.25 cm2
AV Mean grad: 15 mmHg
AV Peak grad: 28.9 mmHg
Ao pk vel: 2.69 m/s
Area-P 1/2: 3.77 cm2
Height: 67 in
S' Lateral: 3.5 cm
Weight: 2634.94 oz

## 2021-12-13 LAB — BASIC METABOLIC PANEL
Anion gap: 8 (ref 5–15)
BUN: 45 mg/dL — ABNORMAL HIGH (ref 8–23)
CO2: 24 mmol/L (ref 22–32)
Calcium: 10.1 mg/dL (ref 8.9–10.3)
Chloride: 108 mmol/L (ref 98–111)
Creatinine, Ser: 2.78 mg/dL — ABNORMAL HIGH (ref 0.61–1.24)
GFR, Estimated: 22 mL/min — ABNORMAL LOW (ref >60)
Glucose, Bld: 123 mg/dL — ABNORMAL HIGH (ref 70–99)
Potassium: 4.3 mmol/L (ref 3.5–5.1)
Sodium: 140 mmol/L (ref 135–145)

## 2021-12-13 LAB — GLUCOSE, CAPILLARY
Glucose-Capillary: 114 mg/dL — ABNORMAL HIGH (ref 70–99)
Glucose-Capillary: 110 mg/dL — ABNORMAL HIGH (ref 70–99)
Glucose-Capillary: 109 mg/dL — ABNORMAL HIGH (ref 70–99)
Glucose-Capillary: 156 mg/dL — ABNORMAL HIGH (ref 70–99)

## 2021-12-13 LAB — URIC ACID: Uric Acid, Serum: 6.8 mg/dL (ref 3.7–8.6)

## 2021-12-13 MED ORDER — POLYETHYLENE GLYCOL 3350 17 G PO PACK
17.0000 g | PACK | Freq: Every day | ORAL | Status: DC
  Administered 2021-12-13 – 2021-12-14 (×2): 17 g via ORAL
  Filled 2021-12-13 (×2): qty 1

## 2021-12-13 NOTE — TOC Progression Note (Signed)
Transition of Care [facility]) - Progression Note   Patient Details  Name: Ian [name] MRN: [id number] Date of Birth: 1936-02-20  Transition of Care [facility]) CM/SW [place], LCSW Phone Number: 12/13/2021, 2:28 PM  Clinical Narrative: CSW reviewed bed offers with son. [facility] choice and [place] is second choice. CSW confirmed with Perrin Smack in admissions at [facility] that a bed will be available tomorrow pending insurance authorization. CSW called [facility] to provide bed choice and notified [facility] the patient is expected to be medically ready tomorrow. TOC awaiting insurance authorization.  Expected Discharge Plan: Skilled Nursing Facility Barriers to Discharge: SNF Pending bed offer, Ship broker, Continued Medical Work up  Expected Discharge Plan and Services Expected Discharge Plan: [place] In-house Referral: Clinical Social Work Post Acute Care Choice: [place] Living arrangements for the past 2 months: Single Family Home             DME Arranged: N/A DME Agency: NA  Readmission Risk Interventions     No data to display

## 2021-12-13 NOTE — Progress Notes (Signed)
PROGRESS NOTE    Ian [name]  GMW:[id number] DOB: Nov 28, 1935 DOA: 12/07/2021 PCP: Lauree Chandler, NP     Brief Narrative:  86 y.o. male with medical history significant for Alzheimer's dementia, stage IV CKD with baseline creatinine 2.9-3.0, hypertension, depression, COPD, chronic anemia with baseline hemoglobin 10-12, hyperlipidemia, chronic diastolic heart failure, of who is admitted to [facility] on 12/07/2021 by way of transfer from [facility] emergency department with severe sepsis due to community-acquired pneumonia after presenting from home to [facility] emergency department for further evaluation of altered mental status.   Pt was found to have concerns of CAP   Subjective:  I did  not hear cough today, he denies chest pain Continue to c/o right ankle pain and edema on the lateral side, overall improved some Appear less confused today Son at bedside    Assessment & Plan:  Principal Problem:   Severe sepsis ([place]) Active Problems:   Allergic rhinitis   COPD (chronic obstructive pulmonary disease) (HCC)   Hyperlipidemia   DM2 (diabetes mellitus, type 2) (HCC)   Chronic diastolic CHF (congestive heart failure) (HCC)   HTN (hypertension)   Depression with anxiety   CAP (community acquired pneumonia)   Acute renal failure superimposed on stage 4 chronic kidney disease (HCC)   Acute metabolic encephalopathy   Anemia, unspecified    Assessment and Plan:  Sepsis with community-acquired pneumonia, acute hypoxic respiratory failure ,acute metabolic encephalopathy present on admission - Patient presented with leukocytosis, tachycardia, encephalopathy with renal failure.  Symptoms did not improve with outpatient doxycycline -Encephalopathy has improved ,he is alert oriented x3 today but still confused not back to baseline yet per son -He initially required 4 L oxygen supplement, today down to 2 L, WBC normalized, tachycardia resolved, plan to finish total 5 days  Rocephin and Zithromax -wean oxygen -Need to repeat chest x-ray in 6 to 8 weeks postdischarge  AKI on CKD IV/anemia of chronic disease -Creatinine on presentation was 3.65, creatinine improved today is 2.72 close to baseline -Hemoglobin close to baseline around 10 -Continue monitor renal function, renal dosing meds -f/u with nephrology Dr Posey Pronto  Hypertension/chronic diastolic CHF -Blood pressure stable on current regimen   Diet-controlled diabetes Blood glucose stable  Hyperlipidemia -Continue statin  COPD No wheezing  Right leg pain/ right knee pain, right ankle and foot pain -not able to bear weight -Reports started after scd use,  -right foot x ray no acute findings except small Achilles and calcaneal spurs, mild hammer toe deformities, vascular calcifications -right knee/ankle x ray no acute findings  -right lower leg venous doppler negative for DVT -uric acid 6.28m,denies h/o gout -soft tissue ultrasound showed diffuse edema , no loculated abscess or any soft tissue masses  -it is not consistent with cellulitis on exam, elevate leg, consider outpatient mri if symptom persists   FTT, Pt recommend SNF, patient and son in agreement  Nutritional Assessment: The patient's BMI is: Body mass index is 25.79 kg/m.Marland Kitchen Seen by dietician.  I agree with the assessment and plan as outlined below: Nutrition Status: Nutrition Problem: Increased nutrient needs Etiology: acute illness Signs/Symptoms: estimated needs Interventions: Boost Breeze, Ensure Enlive (each supplement provides 350kcal and 20 grams of protein)  .   I have Reviewed nursing notes, Vitals, pain scores, I/o's, Lab results and  imaging results since pt's last encounter, details please see discussion above  I ordered the following labs:  Unresulted Labs (From admission, onward)    None  DVT prophylaxis: SCDs Start: 12/07/21 2222   Code Status:   Code Status: DNR  Family Communication: Son at the  bedside Disposition:   Dispo: The patient is from: Home, lives alone              Anticipated d/c is to: SNF              Anticipated d/c date is: likely will be medically ready to d/c to snf on 6/9  Antimicrobials:    Anti-infectives (From admission, onward)    Start     Dose/Rate Route Frequency Ordered Stop   12/08/21 1800  azithromycin (ZITHROMAX) 500 mg in sodium chloride 0.9 % 250 mL IVPB        500 mg 250 mL/hr over 60 Minutes Intravenous Every 24 hours 12/07/21 2243 12/12/21 2001   12/08/21 1800  cefTRIAXone (ROCEPHIN) 1 g in sodium chloride 0.9 % 100 mL IVPB        1 g 200 mL/hr over 30 Minutes Intravenous Every 24 hours 12/07/21 2243 12/12/21 1853   12/07/21 1715  cefTRIAXone (ROCEPHIN) 1 g in sodium chloride 0.9 % 100 mL IVPB        1 g 200 mL/hr over 30 Minutes Intravenous  Once 12/07/21 1701 12/07/21 1855   12/07/21 1715  azithromycin (ZITHROMAX) tablet 500 mg        500 mg Oral  Once 12/07/21 1701 12/07/21 1750          Objective: Vitals:   12/13/21 0720 12/13/21 0734 12/13/21 1334 12/13/21 1355  BP:    (!) 161/64  Pulse:    (!) 59  Resp:    18  Temp:    98.4 F (36.9 C)  TempSrc:    Oral  SpO2: (!) 88% 93% 92% 96%  Weight:      Height:        Intake/Output Summary (Last 24 hours) at 12/13/2021 1551 Last data filed at 12/13/2021 1400 Gross per 24 hour  Intake 1320 ml  Output 1000 ml  Net 320 ml   Filed Weights   12/11/21 0534 12/12/21 0550 12/13/21 0446  Weight: 71.5 kg 72.3 kg 74.7 kg    Examination:  General exam: Appear weak, AAOx3, confusion appears has improved ( son agrees), hard of hearing  Respiratory system: Diminished, no wheezing, no rales, no rhonchi, respiratory effort normal. Cardiovascular system:  RRR.  Gastrointestinal system: Abdomen is nondistended, soft and nontender.  Normal bowel sounds heard. Central nervous system: Alert and orientedx3. No focal neurological deficits. Extremities:  right foot edema on lateral side, no  erythema  Skin: No rashes, lesions or ulcers Psychiatry: Calm and cooperative,    Data Reviewed: I have personally reviewed  labs and visualized  imaging studies since the last encounter and formulate the plan        Scheduled Meds:  amLODipine  10 mg Oral Daily   aspirin EC  81 mg Oral Daily   atorvastatin  80 mg Oral Daily   feeding supplement  1 Container Oral Q24H   feeding supplement  237 mL Oral Q24H   fluticasone  1 spray Each Nare Daily   gatifloxacin  1 drop Right Eye BID   hydrALAZINE  25 mg Oral BID   insulin aspart  0-6 Units Subcutaneous TID WC   ipratropium-albuterol  3 mL Nebulization TID   lidocaine  1 patch Transdermal Q24H   mouth rinse  15 mL Mouth Rinse BID   metoprolol tartrate  50 mg Oral  BID   montelukast  10 mg Oral QHS   PARoxetine  40 mg Oral Daily   polyethylene glycol  17 g Oral Daily   senna-docusate  1 tablet Oral BID   Continuous Infusions:  sodium chloride 10 mL/hr at 12/10/21 1800     LOS: 6 days    Florencia Reasons, MD PhD FACP [facility]  Available via Epic secure chat 7am-7pm for nonurgent issues Please page for urgent issues To page the attending provider between 7A-7P or the covering provider during after hours 7P-7A, please log into the web site [email or web address] and access using universal [place] password for that web site. If you do not have the password, please call the hospital operator.    12/13/2021, 3:51 PM

## 2021-12-13 NOTE — Progress Notes (Signed)
Occupational Therapy Treatment Patient Details Name: Ian [name] MRN: [id number] DOB: 11-09-1935 Today's Date: 12/13/2021   History of present illness 86 y.o. male admitted with sepsis, Pna, AMS. History significant for Alzheimer's dementia, stage IV CKD, hypertension, depression, COPD, chronic anemia, hyperlipidemia, chronic diastolic heart failure   OT comments  Pt making good progress toward goals. Pt's R foot pain appears improved from last session with pt able to bear weight through it and walk some in the room. Pt remains unsteady at times with walker and will need some education on walker safety and time to get strength and balance back before pt can live alone again. Will continue to follow with focus on functional tasks with walker and home skills.    Recommendations for follow up therapy are one component of a multi-disciplinary discharge planning process, led by the attending physician.  Recommendations may be updated based on patient status, additional functional criteria and insurance authorization.    Follow Up Recommendations  Skilled nursing-short term rehab (<3 hours/day)    Assistance Recommended at Discharge Frequent or constant Supervision/Assistance  Patient can return home with the following  A little help with walking and/or transfers;A little help with bathing/dressing/bathroom;Assistance with cooking/housework;Direct supervision/assist for financial management;Direct supervision/assist for medications management   Equipment Recommendations  None recommended by OT    Recommendations for Other Services      Precautions / Restrictions Precautions Precautions: Fall Precaution Comments: monitor O2 Restrictions Weight Bearing Restrictions: No       Mobility Bed Mobility Overal bed mobility: Needs Assistance Bed Mobility: Supine to Sit     Supine to sit: Supervision, HOB elevated Sit to supine: Supervision, HOB elevated   General bed mobility comments: Supv  for safety, lines.    Transfers Overall transfer level: Needs assistance Equipment used: Rolling walker (2 wheels) Transfers: Sit to/from Stand Sit to Stand: Min guard           General transfer comment: cues for hand placement     Balance Overall balance assessment: Needs assistance Sitting-balance support: No upper extremity supported, Feet supported Sitting balance-Leahy Scale: Good     Standing balance support: Bilateral upper extremity supported, During functional activity Standing balance-Leahy Scale: Poor Standing balance comment: Pt required walker or countertop to lean on to assist with balance. Balance and foot pain appears improved since last session but continues to be limited with balance.                           ADL either performed or assessed with clinical judgement   ADL Overall ADL's : Needs assistance/impaired Eating/Feeding: Independent   Grooming: Wash/dry hands;Wash/dry face;Oral care;Supervision/safety;Standing Grooming Details (indicate cue type and reason): pt stood at sink for appx 3 minutes to complete grooming tasks with initial min guard but progressed to supervision for last minute with no balance loss. Upper Body Bathing: Set up;Sitting   Lower Body Bathing: Minimal assistance;Sit to/from stand Lower Body Bathing Details (indicate cue type and reason): min assist only for balance when standing. Upper Body Dressing : Set up;Sitting   Lower Body Dressing: Minimal assistance;Sit to/from stand;Cueing for compensatory techniques Lower Body Dressing Details (indicate cue type and reason): pt donned socks without assist.  Min guard given in standing to manage pants. Toilet Transfer: Minimal assistance;Regular Toilet;Rolling walker (2 wheels);Grab bars Toilet Transfer Details (indicate cue type and reason): cues for walker safety and to tend to lines Toileting- Clothing Manipulation and Hygiene: Minimal assistance;Sit to/from stand  Functional mobility during ADLs: Minimal assistance;Rolling walker (2 wheels) General ADL Comments: Pt does not usually use a walker so continues to require VCs for walker safety, hand placement when standing and sitting and keeping walker right with him at all times.    Extremity/Trunk Assessment Upper Extremity Assessment Upper Extremity Assessment: Overall WFL for tasks assessed   Lower Extremity Assessment Lower Extremity Assessment: Overall WFL for tasks assessed        Vision   Vision Assessment?: No apparent visual deficits Additional Comments: wears glasses   Perception Perception Perception: Within Functional Limits   Praxis Praxis Praxis: Intact    Cognition Arousal/Alertness: Awake/alert Behavior During Therapy: WFL for tasks assessed/performed Overall Cognitive Status: Within Functional Limits for tasks assessed                                 General Comments: Pt A&Ox4. Answered basic questions correctly        Exercises      Shoulder Instructions       General Comments Pt much improved over previous session when foot pain was a hinderance. Pt states he lives alone and does not have someone who can stay with him. Do not feel he is quite safe enough to d/c home alone therefore will continue to rec snf.    Pertinent Vitals/ Pain       Pain Assessment Pain Assessment: Faces Faces Pain Scale: Hurts little more Pain Location: R LE-knee down to foot Pain Descriptors / Indicators: Guarding, Discomfort Pain Intervention(s): Monitored during session, Repositioned, Premedicated before session, Limited activity within patient's tolerance  Home Living                                          Prior Functioning/Environment              Frequency  Min 2X/week        Progress Toward Goals  OT Goals(current goals can now be found in the care plan section)  Progress towards OT goals: Progressing toward goals  Acute  Rehab OT Goals Patient Stated Goal: to be well enough to return home OT Goal Formulation: With patient Time For Goal Achievement: 12/23/21 Potential to Achieve Goals: Good ADL Goals Pt Will Perform Lower Body Dressing: with modified independence;sit to/from stand Pt Will Transfer to Toilet: with modified independence;ambulating;regular height toilet;grab bars Pt Will Perform Toileting - Clothing Manipulation and hygiene: with modified independence;sit to/from stand Additional ADL Goal #1: Patient will stand at sink to perform grooming task as evidence of improving balance with supervision only  Plan Discharge plan needs to be updated    Co-evaluation                 AM-PAC OT "6 Clicks" Daily Activity     Outcome Measure   Help from another person eating meals?: None Help from another person taking care of personal grooming?: None Help from another person toileting, which includes using toliet, bedpan, or urinal?: A Little Help from another person bathing (including washing, rinsing, drying)?: A Little Help from another person to put on and taking off regular upper body clothing?: None Help from another person to put on and taking off regular lower body clothing?: A Little 6 Click Score: 21    End of Session Equipment Utilized During Treatment: Rolling walker (  2 wheels)  OT Visit Diagnosis: Unsteadiness on feet (R26.81);Muscle weakness (generalized) (M62.81)   Activity Tolerance Patient limited by pain   Patient Left with call bell/phone within reach;in chair;with chair alarm set   Nurse Communication Other (comment) (updated nurse on pain in R foot)        Time: 2924-4628 OT Time Calculation (min): 23 min  Charges: OT General Charges $OT Visit: 1 Visit OT Treatments $Self Care/Home Management : 23-37 mins   Glenford Peers 12/13/2021, 12:03 PM

## 2021-12-13 NOTE — Care Management Important Message (Signed)
Important Message  Patient Details IM Letter placed in Patients room. Name: Ian [name] MRN: [id number] Date of Birth: 03-Feb-1936   Medicare Important Message Given:  Yes     Kerin Salen 12/13/2021, 1:43 PM

## 2021-12-13 NOTE — Progress Notes (Signed)
SATURATION QUALIFICATIONS: (This note is used to comply with regulatory documentation for home oxygen)  Patient Saturations on Room Air at Rest = 92%  Patient Saturations on Room Air while Ambulating = 87%  Patient Saturations on 2 Liters of oxygen after back from walk the sat was 94% on 3L O2.  Ambulating --see below -didn't apply O2 while ambulating  Please briefly explain why patient needs home oxygen:   Pt fluctuated between 87% - 90% sats while ambulating. No distress noted. Back to room and placed on 3L O2 per [place]. The highest sat after application of oxygen was 94%.

## 2021-12-13 NOTE — Progress Notes (Signed)
Echocardiogram 2D Echocardiogram has been performed.  Ian [name] 12/13/2021, 10:49 AM

## 2021-12-14 DIAGNOSIS — J449 Chronic obstructive pulmonary disease, unspecified: Secondary | ICD-10-CM | POA: Diagnosis not present

## 2021-12-14 DIAGNOSIS — R5383 Other fatigue: Secondary | ICD-10-CM | POA: Diagnosis not present

## 2021-12-14 DIAGNOSIS — R2689 Other abnormalities of gait and mobility: Secondary | ICD-10-CM | POA: Diagnosis not present

## 2021-12-14 DIAGNOSIS — G309 Alzheimer's disease, unspecified: Secondary | ICD-10-CM | POA: Diagnosis not present

## 2021-12-14 DIAGNOSIS — D649 Anemia, unspecified: Secondary | ICD-10-CM | POA: Diagnosis not present

## 2021-12-14 DIAGNOSIS — E785 Hyperlipidemia, unspecified: Secondary | ICD-10-CM | POA: Diagnosis not present

## 2021-12-14 DIAGNOSIS — R41841 Cognitive communication deficit: Secondary | ICD-10-CM | POA: Diagnosis not present

## 2021-12-14 DIAGNOSIS — F32A Depression, unspecified: Secondary | ICD-10-CM | POA: Diagnosis not present

## 2021-12-14 DIAGNOSIS — G9341 Metabolic encephalopathy: Secondary | ICD-10-CM | POA: Diagnosis not present

## 2021-12-14 DIAGNOSIS — E1159 Type 2 diabetes mellitus with other circulatory complications: Secondary | ICD-10-CM | POA: Diagnosis not present

## 2021-12-14 DIAGNOSIS — M5441 Lumbago with sciatica, right side: Secondary | ICD-10-CM | POA: Diagnosis not present

## 2021-12-14 DIAGNOSIS — J189 Pneumonia, unspecified organism: Secondary | ICD-10-CM | POA: Diagnosis not present

## 2021-12-14 DIAGNOSIS — I5032 Chronic diastolic (congestive) heart failure: Secondary | ICD-10-CM | POA: Diagnosis not present

## 2021-12-14 DIAGNOSIS — Z7401 Bed confinement status: Secondary | ICD-10-CM | POA: Diagnosis not present

## 2021-12-14 DIAGNOSIS — R262 Difficulty in walking, not elsewhere classified: Secondary | ICD-10-CM | POA: Diagnosis not present

## 2021-12-14 DIAGNOSIS — F419 Anxiety disorder, unspecified: Secondary | ICD-10-CM | POA: Diagnosis not present

## 2021-12-14 DIAGNOSIS — R6520 Severe sepsis without septic shock: Secondary | ICD-10-CM | POA: Diagnosis not present

## 2021-12-14 DIAGNOSIS — I10 Essential (primary) hypertension: Secondary | ICD-10-CM | POA: Diagnosis not present

## 2021-12-14 DIAGNOSIS — M6281 Muscle weakness (generalized): Secondary | ICD-10-CM | POA: Diagnosis not present

## 2021-12-14 DIAGNOSIS — R531 Weakness: Secondary | ICD-10-CM | POA: Diagnosis not present

## 2021-12-14 DIAGNOSIS — A419 Sepsis, unspecified organism: Secondary | ICD-10-CM | POA: Diagnosis not present

## 2021-12-14 LAB — GLUCOSE, CAPILLARY
Glucose-Capillary: 100 mg/dL — ABNORMAL HIGH (ref 70–99)
Glucose-Capillary: 122 mg/dL — ABNORMAL HIGH (ref 70–99)

## 2021-12-14 MED ORDER — SENNOSIDES-DOCUSATE SODIUM 8.6-50 MG PO TABS: 1.0000 | ORAL_TABLET | Freq: Two times a day (BID) | ORAL | Status: DC

## 2021-12-14 MED ORDER — IPRATROPIUM-ALBUTEROL 0.5-2.5 (3) MG/3ML IN SOLN: 3.0000 mL | Freq: Two times a day (BID) | RESPIRATORY_TRACT | Status: DC

## 2021-12-14 MED ORDER — POLYETHYLENE GLYCOL 3350 17 G PO PACK: 17.0000 g | PACK | Freq: Every day | ORAL | 0 refills | Status: AC

## 2021-12-14 MED ORDER — HYDRALAZINE HCL 25 MG PO TABS: 25.0000 mg | ORAL_TABLET | Freq: Three times a day (TID) | ORAL | 3 refills | Status: DC

## 2021-12-14 NOTE — TOC Transition Note (Signed)
Transition of Care [facility]) - CM/SW Discharge Note  Patient Details  Name: Ian [name] MRN: [id number] Date of Birth: Sep 03, 1935  [facility] CM/SW Contact:  Sherie Don, LCSW Phone Number: 12/14/2021, 12:09 PM  Clinical Narrative: Patient is medically stable for discharge to SNF. CSW spoke with Marlowe Kays with [employer] and patient has been approved for SNF [facility] #: [phone]) and [facility] #: [id number]). Patient was approved for 7 days. CSW confirmed admission with [place] at [facility]. Discharge summary, discharge orders, and SNF transfer report faxed to facility in hub. Medical necessity form done; PTAR scheduled. Discharge packet completed. Son notified of discharge. RN updated. TOC signing off.  Final next level of care: Skilled Nursing Facility Barriers to Discharge: Barriers Resolved  Patient Goals and CMS Choice Patient states their goals for this hospitalization and ongoing recovery are:: To get better CMS Medicare.gov Compare Post Acute Care list provided to:: Patient Represenative (must comment) Choice offered to / list presented to : Patient, Adult Children  Discharge Placement Existing PASRR number confirmed : 12/12/21          Patient chooses bed at: [place] Patient to be transferred to facility by: [place] Name of family member notified: Mont Jagoda (son) Patient and family notified of of transfer: 12/14/21  Discharge Plan and Services In-house Referral: Clinical Social Work Post Acute Care Choice: [place]          DME Arranged: N/A DME Agency: NA  Readmission Risk Interventions     No data to display

## 2021-12-14 NOTE — Progress Notes (Signed)
[facility]) Hospital Liaison note:  Notified via workque from Dr. Florencia Reasons of request for [place] services. Will continue to follow for disposition.  Please call with any outpatient palliative questions or concerns.  Thank you for the opportunity to participate in this patient's care.  Thank you, Lorelee Market, LPN [facility] Liaison [phone]

## 2021-12-14 NOTE — Discharge Summary (Addendum)
Discharge Summary  Ian [name] DXI:[id number] DOB: 1936-03-26  PCP: Lauree Chandler, NP  Admit date: 12/07/2021 Discharge date: 12/14/2021  Time spent: 50mns, more than 50% time spent on coordination of care.   Recommendations for Outpatient Follow-up:  F/u with SNF MD  for hospital discharge follow up, repeat cbc/bmp at follow up Continue to wean o2 at snf, need to repeat cxr in 6-8 weeks, earlier if clinically indicated  F/u with nephrology Dr PPosey Prontoin 2 weeks Recommend palliative [facility]  [facility]  Discharge Diagnoses:  Active Hospital Problems   Diagnosis Date Noted   [place]vere [place] ([facility] 12/07/2021   CAP (community acquired pneumonia) 12/07/2021   Acute renal failure superimposed on stage 4 chronic kidney disease ([facility] [place] 076/73/4193  Acute metabolic encephalopathy 079/08/4095  Anemia, unspecified    Depression with anxiety 12/18/2018   HTN (hypertension) 04/07/2018   Chronic diastolic CHF (congestive heart failure) ([place] 04/07/2018   DM2 (diabetes mellitus, type 2) ([facility] 12/29/2012   COPD (chronic obstructive pulmonary disease) (HCC)    Hyperlipidemia    Allergic rhinitis 12/03/2011    Resolved Hospital Problems  No resolved problems to display.    Discharge Condition: stable  Diet recommendation: heart healthy/carb modified  Filed Weights   12/12/21 0550 12/13/21 0446 12/14/21 0426  Weight: 72.3 kg 74.7 kg 73.2 kg    History of present illness:  H/o COPD, HTN, HLD, chronic dCHF, stage IV CKD , chronic anemia [facility]  with severe sepsis due to community-acquired pneumonia and acute metabolic encephalopath   Hospital Course:  Principal Problem:   Severe sepsis ([place] Active Problems:   Allergic rhinitis   COPD (chronic obstructive pulmonary disease) (HCC)   Hyperlipidemia   DM2 (diabetes mellitus, type 2) (HCC)   Chronic diastolic CHF (congestive heart failure) (HCC)   HTN (hypertension)   Depression with  anxiety   CAP (community acquired pneumonia)   Acute renal failure superimposed on stage 4 chronic kidney disease (HCC)   Acute metabolic encephalopathy   Anemia, unspecified   Assessment and Plan:   Sepsis with community-acquired pneumonia, acute hypoxic respiratory failure ,acute metabolic encephalopathy present on admission - Patient presented with leukocytosis, tachycardia, encephalopathy with renal failure.  Symptoms did not improve with outpatient doxycycline -Encephalopathy has improved ,he is alert oriented x3, patient does not have a h/o dementia per son -hypoxia improved, He initially required 4 L oxygen supplement, today down to 2 L, -sepsis resolved,  WBC normalized, tachycardia resolved,  finished total 5 days Rocephin and Zithromax -encourage activity and incentive spirometer, wean o2  -Need to repeat chest x-ray in 6 to 8 weeks postdischarge  Patient ambulated on room air on 6/8 pm:  Patient Saturations on Room Air at Rest = 92%  Patient Saturations on Room Air while Ambulating = 87%  Patient Saturations on 2 Liters of oxygen after back from walk the sat was 94% on 3L O2 There is a reports of sleep apnea, he reports stopped using cpap due to feeling better, he may benefit from at least night time 02, with his history of copd , goal of o2 sats around low 90's    AKI on CKD IV/anemia of chronic disease -Creatinine on presentation was 3.65, creatinine improved today is 2.72 close to baseline -Hemoglobin close to baseline around 10 -Continue monitor renal function, renal dosing meds -f/u with nephrology Dr PPosey Pronto  Hypertension -continue home meds norvasc and metoprolol, increase hydralazine  -  may need to further adjust bp at snf  chronic diastolic CHF Does not appear to have significant volume overload on exam Continue monitor volume status     Diet-controlled diabetes Blood glucose stable   Hyperlipidemia -Continue statin   COPD, quit smoking in his 58's No  wheezing   Right leg pain/ right knee pain, right ankle and foot pain -not able to bear weight initially  -Reports started after scd use,  -right foot x ray no acute findings except small Achilles and calcaneal spurs, mild hammer toe deformities, vascular calcifications -right knee/ankle x ray no acute findings  -right lower leg venous doppler negative for DVT -uric acid 6.53mdenies h/o gout -soft tissue ultrasound showed diffuse edema , no loculated abscess or any soft tissue masses  -it is not consistent with cellulitis on exam, elevate leg, consider outpatient mri if symptom persists , appear is improving, edema is down, pain is much better  H/o depression Appear to be on paxil at home, continue  F/u with pcp  Constipation: stool softener    FTT, Pt recommend SNF, patient and son in agreement   Nutritional Assessment: The patient's BMI is: Body mass index is 25.79 kg/m..Marland KitchenSeen by dietician.  I agree with the assessment and plan as outlined below: Nutrition Status: Nutrition Problem: Increased nutrient needs Etiology: acute illness Signs/Symptoms: estimated needs Interventions: Boost Breeze, Ensure Enlive (each supplement provides 350kcal and 20 grams of protein)   Code Status:   Code Status: DNR   Family Communication: Son over the phone   . Discharge Exam: BP (!) 155/68 (BP Location: Left Arm)   Pulse (!) 59   Temp 98.4 F (36.9 C) (Oral)   Resp 20   Ht 5' 7"  (1.702 m)   Wt 73.2 kg   SpO2 94%   BMI 25.28 kg/m   General: NAD, AAOx3 Cardiovascular: RRR Respiratory: normal respiratory effort      Discharge Instructions     Amb Referral to Palliative Care   Complete by: As directed    Diet - low sodium heart healthy   Complete by: As directed    Carb modified   Increase activity slowly   Complete by: As directed       Allergies as of 12/14/2021       Reactions   Ace Inhibitors Cough        Medication List     STOP taking these medications     traZODone 50 MG tablet Commonly known as: DESYREL       TAKE these medications    acetaminophen 500 MG tablet Commonly known as: TYLENOL Take 500 mg by mouth every 8 (eight) hours as needed for moderate pain.   albuterol 108 (90 Base) MCG/ACT inhaler Commonly known as: VENTOLIN HFA Inhale 2 puffs into the lungs every 6 (six) hours as needed for wheezing or shortness of breath.   amLODipine 10 MG tablet Commonly known as: NORVASC TAKE 1 TABLET BY MOUTH EVERY DAY   Aspirin Low Dose 81 MG tablet Generic drug: aspirin EC TAKE 1 TABLET BY MOUTH DAILY SWALLOW WHOLE What changed: See the new instructions.   atorvastatin 80 MG tablet Commonly known as: LIPITOR TAKE 1 TABLET BY MOUTH EVERY DAY   Besivance 0.6 % Susp Generic drug: Besifloxacin HCl Place 1 drop into the right eye 2 (two) times daily.   bisacodyl 5 MG EC tablet Generic drug: bisacodyl Take 1 tablet (5 mg total) by mouth daily as needed for moderate constipation.  blood glucose meter kit and supplies Kit Inject 1 each into the skin 2 (two) times daily. Dispense based on patient and insurance preference. Use up to four times daily as directed. (FOR ICD-9 250.00, 250.01).   diclofenac sodium 1 % Gel Commonly known as: VOLTAREN Apply 2 g topically 4 (four) times daily as needed (shoulder pain).   fluticasone 50 MCG/ACT nasal spray Commonly known as: FLONASE Place 1 spray into both nostrils daily.   glucose blood test strip Commonly known as: ONE TOUCH ULTRA TEST Use to test blood sugar twice daily.  DX: E11.9   guaiFENesin 600 MG 12 hr tablet Commonly known as: Mucinex Take 2 tablets (1,200 mg total) by mouth 2 (two) times daily as needed for cough or to loosen phlegm.   hydrALAZINE 25 MG tablet Commonly known as: APRESOLINE Take 1 tablet (25 mg total) by mouth 3 (three) times daily. What changed: when to take this   ipratropium-albuterol 0.5-2.5 (3) MG/3ML Soln Commonly known as: DUONEB Take 3 mLs  by nebulization every 6 (six) hours as needed (wheezing).   loratadine 10 MG tablet Commonly known as: CLARITIN Take 1 tablet (10 mg total) by mouth daily. What changed:  when to take this reasons to take this   metoprolol tartrate 50 MG tablet Commonly known as: LOPRESSOR TAKE 1 TABLET BY MOUTH TWICE A DAY   montelukast 10 MG tablet Commonly known as: SINGULAIR TAKE 1 TABLET BY MOUTH EVERYDAY AT BEDTIME What changed: See the new instructions.   multivitamin with minerals Tabs tablet Take 1 tablet by mouth daily.   ONE TOUCH DELICA LANCING DEV Misc Use to test blood sugar once daily dx: E119   PARoxetine 40 MG tablet Commonly known as: PAXIL TAKE 1 TABLET BY MOUTH EVERY DAY   polyethylene glycol 17 g packet Commonly known as: MIRALAX / GLYCOLAX Take 17 g by mouth daily. Start taking on: December 15, 2021   PRESERVISION AREDS PO Take 1 tablet by mouth 2 (two) times daily.   senna-docusate 8.6-50 MG tablet Commonly known as: Senokot-S Take 1 tablet by mouth 2 (two) times daily.   sodium chloride 0.65 % Soln nasal spray Commonly known as: OCEAN Place 1 spray into both nostrils daily as needed for congestion.   Symbicort 160-4.5 MCG/ACT inhaler Generic drug: budesonide-formoterol Inhale 2 puffs into the lungs 2 (two) times daily.   trimethoprim-polymyxin b ophthalmic solution Commonly known as: POLYTRIM Place 1 drop into the right eye every 6 (six) hours. 2 days after each monthly eye injection.   Vitamin D (Ergocalciferol) 1.25 MG (50000 UNIT) Caps capsule Commonly known as: DRISDOL TAKE 1 CAPSULE BY MOUTH ONE TIME PER WEEK What changed: See the new instructions.       Allergies  Allergen Reactions   Ace Inhibitors Cough    Contact information for follow-up providers     Lauree Chandler, NP Follow up.   Specialty: Geriatric Medicine Why: pcp to repeat cxr Contact information: [address] [place] [address] [phone]         repeat  chest x ray in 6- 8 weeks Follow up.          Elmarie Shiley, MD Follow up in 2 week(s).   Specialty: Nephrology Contact information: [place] [address] [phone]              Contact information for after-discharge care     Destination     [facility] Preferred SNF .  Service: Skilled Nursing Contact information: [address] [place] [phone]                      The results of significant diagnostics from this hospitalization (including imaging, microbiology, ancillary and laboratory) are listed below for reference.    Significant Diagnostic Studies: ECHOCARDIOGRAM COMPLETE  Result Date: 12/13/2021    ECHOCARDIOGRAM REPORT   Patient Name:   AWESOME JARED Date of Exam: 12/13/2021 Medical Rec #:  [id number]  Height:       67.0 in Accession #:    [id number] Weight:       164.7 lb Date of Birth:  February 08, 1936  BSA:          1.862 m Patient Age:    86 years   BP:           159/79 mmHg Patient Gender: M          HR:           62 bpm. Exam Location:  Inpatient Procedure: 2D Echo, Cardiac Doppler and Color Doppler Indications:    Congestive heart failure  History:        Patient has prior history of Echocardiogram examinations, most                 recent 01/04/2013. COPD; Risk Factors:Diabetes and Hypertension.  Sonographer:    Joette Catching RCS Referring Phys: [phone] [place]  1. Left ventricular ejection fraction, by estimation, is 60 to 65%. The left ventricle has normal function. The left ventricle has no regional wall motion abnormalities. There is moderate concentric left ventricular hypertrophy. Left ventricular diastolic parameters are consistent with Grade I diastolic dysfunction (impaired relaxation). Elevated left ventricular end-diastolic pressure.  2. Right ventricular systolic function is normal. The right ventricular size is normal. There is normal pulmonary artery systolic pressure.  3.  Left atrial size was severely dilated.  4. The pericardial effusion is circumferential.  5. The mitral valve is normal in structure. Trivial mitral valve regurgitation. No evidence of mitral stenosis.  6. The aortic valve is tricuspid. There is mild calcification of the aortic valve. There is mild thickening of the aortic valve. Aortic valve regurgitation is not visualized. Mild aortic valve stenosis. Aortic valve area, by VTI measures 1.32 cm. Aortic valve mean gradient measures 15.0 mmHg. Aortic valve Vmax measures 2.69 m/s.  7. The inferior vena cava is normal in size with greater than 50% respiratory variability, suggesting right atrial pressure of 3 mmHg. FINDINGS  Left Ventricle: Left ventricular ejection fraction, by estimation, is 60 to 65%. The left ventricle has normal function. The left ventricle has no regional wall motion abnormalities. The left ventricular internal cavity size was normal in size. There is  moderate concentric left ventricular hypertrophy. Left ventricular diastolic parameters are consistent with Grade I diastolic dysfunction (impaired relaxation). Elevated left ventricular end-diastolic pressure. Right Ventricle: The right ventricular size is normal. No increase in right ventricular wall thickness. Right ventricular systolic function is normal. There is normal pulmonary artery systolic pressure. The tricuspid regurgitant velocity is 1.79 m/s, and  with an assumed right atrial pressure of 3 mmHg, the estimated right ventricular systolic pressure is 73.4 mmHg. Left Atrium: Left atrial size was severely dilated. Right Atrium: Right atrial size was normal in size. Pericardium: Trivial pericardial effusion is present. The pericardial effusion is circumferential. Mitral Valve: The mitral valve is normal in structure. Trivial mitral valve regurgitation. No evidence of mitral  valve stenosis. Tricuspid Valve: The tricuspid valve is normal in structure. Tricuspid valve regurgitation is trivial.  No evidence of tricuspid stenosis. Aortic Valve: The aortic valve is tricuspid. There is mild calcification of the aortic valve. There is mild thickening of the aortic valve. Aortic valve regurgitation is not visualized. Mild aortic stenosis is present. Aortic valve mean gradient measures  15.0 mmHg. Aortic valve peak gradient measures 28.9 mmHg. Aortic valve area, by VTI measures 1.32 cm. Pulmonic Valve: The pulmonic valve was normal in structure. Pulmonic valve regurgitation is mild. No evidence of pulmonic stenosis. Aorta: The aortic root is normal in size and structure. Venous: The inferior vena cava is normal in size with greater than 50% respiratory variability, suggesting right atrial pressure of 3 mmHg. IAS/Shunts: No atrial level shunt detected by color flow Doppler.  LEFT VENTRICLE PLAX 2D LVIDd:         5.70 cm   Diastology LVIDs:         3.50 cm   LV e' medial:    3.38 cm/s LV PW:         1.30 cm   LV E/e' medial:  18.2 LV IVS:        1.50 cm   LV e' lateral:   3.19 cm/s LVOT diam:     2.00 cm   LV E/e' lateral: 19.3 LV SV:         78 LV SV Index:   42 LVOT Area:     3.14 cm  RIGHT VENTRICLE             IVC RV Basal diam:  4.10 cm     IVC diam: 1.30 cm RV Mid diam:    2.40 cm RV S prime:     14.80 cm/s TAPSE (M-mode): 2.4 cm LEFT ATRIUM              Index        RIGHT ATRIUM           Index LA diam:        4.20 cm  2.26 cm/m   RA Area:     16.30 cm LA Vol (A2C):   105.0 ml 56.39 ml/m  RA Volume:   37.90 ml  20.35 ml/m LA Vol (A4C):   54.7 ml  29.38 ml/m LA Biplane Vol: 79.0 ml  42.43 ml/m  AORTIC VALVE                     PULMONIC VALVE AV Area (Vmax):    1.13 cm      PV Vmax:          0.98 m/s AV Area (Vmean):   1.25 cm      PV Peak grad:     3.8 mmHg AV Area (VTI):     1.32 cm      PR End Diast Vel: 6.25 msec AV Vmax:           269.00 cm/s AV Vmean:          172.600 cm/s AV VTI:            0.592 m AV Peak Grad:      28.9 mmHg AV Mean Grad:      15.0 mmHg LVOT Vmax:         96.80 cm/s LVOT  Vmean:        68.900 cm/s LVOT VTI:          0.249 m LVOT/AV VTI ratio:  0.42  AORTA Ao Root diam: 3.40 cm Ao Asc diam:  3.00 cm MITRAL VALVE                TRICUSPID VALVE MV Area (PHT): 3.77 cm     TR Peak grad:   12.8 mmHg MV Decel Time: 201 msec     TR Vmax:        179.00 cm/s MV E velocity: 61.60 cm/s MV A velocity: 106.00 cm/s  SHUNTS MV E/A ratio:  0.58         Systemic VTI:  0.25 m                             Systemic Diam: 2.00 cm Skeet Latch MD Electronically signed by Skeet Latch MD Signature Date/Time: 12/13/2021/4:10:01 PM    Final    [place] RT LOWER EXTREM LTD SOFT TISSUE NON VASCULAR  Result Date: 12/13/2021 CLINICAL DATA:  Pain and swelling right foot EXAM: ULTRASOUND right LOWER EXTREMITY LIMITED TECHNIQUE: Ultrasound examination of the lower extremity soft tissues was performed in the area of clinical concern. COMPARISON:  None Available. FINDINGS: There is subcutaneous edema in the area of patient's symptoms in the right ankle and right foot. There is no loculated fluid collection or demonstrable hematoma. IMPRESSION: There is diffuse edema in the subcutaneous plane in the right ankle and right foot suggesting cellulitis or contusion. There is no demonstrable loculated abscess or any soft tissue masses in the area of patient's symptoms. Electronically Signed   By: Elmer Picker M.D.   On: 12/13/2021 12:53   VAS [place] LOWER EXTREMITY VENOUS (DVT)  Result Date: 12/12/2021  Lower Venous DVT Study Patient Name:  ABAD MANARD  Date of Exam:   12/12/2021 Medical Rec #: [id number]   Accession #:    [id number] Date of Birth: [birth year]   Patient Gender: M Patient Age:   33 years [facility]am [facility] Procedure:      VAS [place] LOWER EXTREMITY VENOUS (DVT) Referring Phys: Annamaria Boots Kaniyah Lisby --------------------------------------------------------------------------------  Indications: Pain.  Risk Factors: None identified. Comparison Study: No prior studies. Performing Technologist: Oliver Hum  RVT  Examination Guidelines: A complete evaluation includes B-mode imaging, spectral Doppler, color Doppler, and power Doppler as needed of all accessible portions of each vessel. Bilateral testing is considered an integral part of a complete examination. Limited examinations for reoccurring indications may be performed as noted. The reflux portion of the exam is performed with the patient in reverse Trendelenburg.  +---------+---------------+---------+-----------+----------+--------------+ RIGHT    CompressibilityPhasicitySpontaneityPropertiesThrombus Aging +---------+---------------+---------+-----------+----------+--------------+ CFV      Full           Yes      Yes                                 +---------+---------------+---------+-----------+----------+--------------+ SFJ      Full                                                        +---------+---------------+---------+-----------+----------+--------------+ FV Prox  Full                                                        +---------+---------------+---------+-----------+----------+--------------+  FV Mid   Full                                                        +---------+---------------+---------+-----------+----------+--------------+ FV DistalFull                                                        +---------+---------------+---------+-----------+----------+--------------+ PFV      Full                                                        +---------+---------------+---------+-----------+----------+--------------+ POP      Full           Yes      Yes                                 +---------+---------------+---------+-----------+----------+--------------+ PTV      Full                                                        +---------+---------------+---------+-----------+----------+--------------+ PERO     Full                                                         +---------+---------------+---------+-----------+----------+--------------+   +----+---------------+---------+-----------+----------+--------------+ LEFTCompressibilityPhasicitySpontaneityPropertiesThrombus Aging +----+---------------+---------+-----------+----------+--------------+ CFV Full           Yes      Yes                                 +----+---------------+---------+-----------+----------+--------------+     Summary: RIGHT: - There is no evidence of deep vein thrombosis in the lower extremity.  - A cystic structure, measuring 1.4 cm high by 3.8 cm wide by 3.9 cm long, is found in the popliteal fossa.  LEFT: - No evidence of common femoral vein obstruction.  *See table(s) above for measurements and observations. Electronically signed by Harold Barban MD on 12/12/2021 at 10:57:35 PM.    Final    DG Ankle 2 Views Right  Result Date: 12/12/2021 CLINICAL DATA:  Pain EXAM: RIGHT ANKLE - 2 VIEW COMPARISON:  None Available. FINDINGS: Nonstandard projections. There is no evidence of fracture, dislocation, or joint effusion. There is no evidence of arthropathy or other focal bone abnormality. Soft tissues are unremarkable. IMPRESSION: Negative. Electronically Signed   By: Lucrezia Europe M.D.   On: 12/12/2021 15:16   DG Knee 1-2 Views Right  Result Date: 12/12/2021 CLINICAL DATA:  Pain. EXAM: RIGHT KNEE - 1-2 VIEW COMPARISON:  None Available. FINDINGS: No evidence of fracture, dislocation, or  joint effusion. There is mild medial compartment joint space narrowing. There is mediolateral compartment chondrocalcinosis. Peripheral vascular calcifications are seen. IMPRESSION: 1. No acute bony abnormality. 2. Mild degenerative changes. Electronically Signed   By: Ronney Asters M.D.   On: 12/12/2021 15:13   DG Chest 2 View  Result Date: 12/12/2021 CLINICAL DATA:  Dementia.  Renal failure. EXAM: CHEST - 2 VIEW COMPARISON:  12/07/2021 FINDINGS: Cardiomegaly as seen previously. Aortic atherosclerosis.  Pulmonary venous hypertension with mild interstitial edema. Tiny amount of fluid in the posterior costophrenic angles. No consolidation or lobar collapse. No abnormal bone finding. IMPRESSION: Findings consistent with fluid overload/congestive heart failure. Cardiomegaly and aortic atherosclerosis. Pulmonary venous hypertension with mild interstitial edema and a small amount of pleural fluid. Electronically Signed   By: Nelson Chimes M.D.   On: 12/12/2021 15:12   DG Foot Complete Right  Result Date: 12/11/2021 CLINICAL DATA:  [id number] y.o. male c/o right foot pain after taking off the compression boots, patient states pain is on the medial/plantar aspect of right foot, near base of the 5th metatarsal. EXAM: RIGHT FOOT COMPLETE - 3+ VIEW COMPARISON:  None Available. FINDINGS: Small Achilles and calcaneal spurs. Mild hammertoe deformities. Vascular calcifications. No periosteal reaction or callus deposition. IMPRESSION: No acute osseous abnormality. Electronically Signed   By: Abigail Miyamoto M.D.   On: 12/11/2021 11:38   DG Chest 2 View  Result Date: 12/07/2021 CLINICAL DATA:  Coughing up mucus for a couple weeks EXAM: CHEST - 2 VIEW COMPARISON:  Chest radiograph 11/27/2021 FINDINGS: The heart is at the upper limits of normal for size, unchanged. The upper mediastinal contours are normal. The lungs hyperinflated with flattening of the diaphragms suggesting underlying COPD. Patchy retrocardiac opacities are again seen in the lateral projection which could reflect persistent infection. There is no other focal airspace disease. There is no pleural effusion or pneumothorax. There is no acute osseous abnormality. IMPRESSION: Persistent patchy opacities in the retrocardiac region seen on the lateral projection are suspicious for persistent infection. Recommend follow-up radiographs in 6-8 weeks to assess for resolution. Electronically Signed   By: Valetta Mole M.D.   On: 12/07/2021 15:26   DG Chest 2 View  Result  Date: 11/28/2021 CLINICAL DATA:  Cough.  Shortness of breath. EXAM: CHEST - 2 VIEW COMPARISON:  December 27, 2020 FINDINGS: There is a calcified nodule in the periphery of the left upper lobe. No other suspicious nodules or masses. Increased left retrocardiac opacity in the interval based on the lateral view. No other infiltrates. Hyperinflation of the lungs remains. Cardiomegaly remains. The hila and mediastinum are unchanged. No other acute abnormalities. IMPRESSION: 1. Increased left retrocardiac opacity worrisome for pneumonia given history. Recommend short-term follow-up imaging after treatment to ensure resolution. 2. Hyperinflation of the lungs consistent with COPD or emphysema. 3. Stable cardiomegaly. 4. No other significant abnormalities. Electronically Signed   By: Dorise Bullion III M.D.   On: 11/28/2021 13:36    Microbiology: Recent Results (from the past 240 hour(s))  Culture, blood (routine x 2)     Status: None   Collection Time: 12/07/21  5:25 PM   Specimen: BLOOD  Result Value Ref Range Status   Specimen Description   Final    BLOOD RIGHT ANTECUBITAL Performed at [facility], [address] [place], [place] [address]    Special Requests   Final    BOTTLES DRAWN AEROBIC AND ANAEROBIC Blood Culture adequate volume Performed at [facility], [address] [place], [place]  [address]    Culture   Final    NO GROWTH 5 DAYS Performed at [facility], [place] [address], [place], [place] [address]    Report Status 12/12/2021 FINAL  Final  SARS Coronavirus 2 by RT PCR (hospital order, performed in [facility] hospital lab) *cepheid single result test* Anterior Nasal Swab     Status: None   Collection Time: 12/07/21  5:38 PM   Specimen: Anterior Nasal Swab  Result Value Ref Range Status   SARS Coronavirus 2 by RT PCR NEGATIVE NEGATIVE Final    Comment: (NOTE) SARS-CoV-2 target nucleic acids are NOT DETECTED.  The SARS-CoV-2 RNA is  generally detectable in upper and lower respiratory specimens during the acute phase of infection. The lowest concentration of SARS-CoV-2 viral copies this assay can detect is 250 copies / mL. A negative result does not preclude SARS-CoV-2 infection and should not be used as the sole basis for treatment or other patient management decisions.  A negative result may occur with improper specimen collection / handling, submission of specimen other than nasopharyngeal swab, presence of viral mutation(s) within the areas targeted by this assay, and inadequate number of viral copies (<250 copies / mL). A negative result must be combined with clinical observations, patient history, and epidemiological information.  Fact Sheet for Patients:   https://www.patel.info/  Fact Sheet for Healthcare Providers: https://hall.com/  This test is not yet approved or  cleared by the [place] FDA and has been authorized for detection and/or diagnosis of SARS-CoV-2 by FDA under an Emergency Use Authorization (EUA).  This EUA will remain in effect (meaning this test can be used) for the duration of the COVID-19 declaration under Section 564(b)(1) of the Act, 21 U.S.C. section 360bbb-3(b)(1), unless the authorization is terminated or revoked sooner.  Performed at [facility], [address] [place], [place] [address]   Culture, blood (routine x 2)     Status: None   Collection Time: 12/07/21 11:00 PM   Specimen: BLOOD  Result Value Ref Range Status   Specimen Description   Final    BLOOD RIGHT ANTECUBITAL Performed at [facility] [address], [place], [place] [address]    Special Requests   Final    BOTTLES DRAWN AEROBIC ONLY Blood Culture adequate volume Performed at [facility] [address] [place], [place] [address]    Culture   Final    NO GROWTH 5 DAYS Performed at [facility], [place] [address], [place], [place] [address]    Report Status 12/13/2021 FINAL  Final     Labs: Basic Metabolic Panel: Recent Labs  Lab 12/08/21 0507 12/09/21 0437 12/10/21 0418 12/11/21 0436 12/13/21 0432  NA 140 141 142 141 140  K 4.1 4.4 4.4 4.5 4.3  CL 114* 113* 114* 110 108  CO2 20* 23 22 23 24   GLUCOSE 127* 97 126* 112* 123*  BUN 61* 51* 43* 37* 45*  CREATININE 3.17* 2.88* 2.68* 2.72* 2.78*  CALCIUM 9.6 9.9 9.9 10.2 10.1  MG 2.3  --   --   --   --   PHOS 3.6  --   --   --   --    Liver Function Tests: Recent Labs  Lab 12/07/21 1504 12/08/21 0507 12/09/21 0437 12/10/21 0418 12/11/21 0436  AST 14* 13* 20 24 23   ALT 12 14 19 23 24   ALKPHOS 52 57 56 55 58  BILITOT 0.4 0.6 0.7 0.7  0.7  PROT 6.6 6.0* 6.0* 5.9* 6.3*  ALBUMIN 3.5 2.9* 2.8* 2.7* 2.9*   No results for input(s): "LIPASE", "AMYLASE" in the last 168 hours. No results for input(s): "AMMONIA" in the last 168 hours. CBC: Recent Labs  Lab 12/07/21 1504 12/08/21 0507 12/09/21 0437 12/10/21 0418 12/11/21 0436  WBC 12.9* 10.9* 10.2 8.6 9.7  NEUTROABS 9.2* 7.4  --   --   --   HGB 9.9* 10.0* 9.8* 9.5* 10.6*  HCT 31.2* 31.4* 30.4* 28.8* 31.8*  MCV 101.6* 103.6* 103.1* 101.1* 99.7  PLT 173 149* 155 149* 167   Cardiac Enzymes: Recent Labs  Lab 12/08/21 0507  CKTOTAL 39*   BNP: BNP (last 3 results) Recent Labs    12/07/21 2301  BNP 726.1*    ProBNP (last 3 results) No results for input(s): "PROBNP" in the last 8760 hours.  CBG: Recent Labs  Lab 12/13/21 1124 12/13/21 1648 12/13/21 2135 12/14/21 0726 12/14/21 1121  GLUCAP 110* 109* 156* 100* 122*    FURTHER DISCHARGE INSTRUCTIONS:   Get Medicines reviewed and adjusted: Please take all your medications with you for your next visit with your Primary MD   Laboratory/radiological data: Please request your Primary MD to go over all hospital tests and procedure/radiological results at the follow up, please ask your Primary MD to  get all Hospital records sent to his/her office.   In some cases, they will be blood work, cultures and biopsy results pending at the time of your discharge. Please request that your primary care M.D. goes through all the records of your hospital data and follows up on these results.   Also Note the following: If you experience worsening of your admission symptoms, develop shortness of breath, life threatening emergency, suicidal or homicidal thoughts you must seek medical attention immediately by calling 911 or calling your MD immediately  if symptoms less severe.   You must read complete instructions/literature along with all the possible adverse reactions/side effects for all the Medicines you take and that have been prescribed to you. Take any new Medicines after you have completely understood and accpet all the possible adverse reactions/side effects.    Do not drive when taking Pain medications or sleeping medications (Benzodaizepines)   Do not take more than prescribed Pain, Sleep and Anxiety Medications. It is not advisable to combine anxiety,sleep and pain medications without talking with your primary care practitioner   Special Instructions: If you have smoked or chewed Tobacco  in the last 2 yrs please stop smoking, stop any regular Alcohol  and or any Recreational drug use.   Wear Seat belts while driving.   Please note: You were cared for by a hospitalist during your hospital stay. Once you are discharged, your primary care physician will handle any further medical issues. Please note that NO REFILLS for any discharge medications will be authorized once you are discharged, as it is imperative that you return to your primary care physician (or establish a relationship with a primary care physician if you do not have one) for your post hospital discharge needs so that they can reassess your need for medications and monitor your lab values.     Signed:  Florencia Reasons MD, PhD, FACP  [facility] 12/14/2021, 11:57 AM

## 2021-12-14 NOTE — Progress Notes (Signed)
Attempted to call report to facility X 2 but unable to connect w receiving nurse. Written info provided in pt packet. D/C via [facility] w all belongings in stable condition on O2 @ 2L. Family aware

## 2021-12-18 ENCOUNTER — Telehealth: Payer: Self-pay

## 2021-12-18 DIAGNOSIS — D649 Anemia, unspecified: Secondary | ICD-10-CM | POA: Diagnosis not present

## 2021-12-18 DIAGNOSIS — M5441 Lumbago with sciatica, right side: Secondary | ICD-10-CM | POA: Diagnosis not present

## 2021-12-18 DIAGNOSIS — G9341 Metabolic encephalopathy: Secondary | ICD-10-CM | POA: Diagnosis not present

## 2021-12-18 DIAGNOSIS — F32A Depression, unspecified: Secondary | ICD-10-CM | POA: Diagnosis not present

## 2021-12-18 NOTE — Telephone Encounter (Signed)
Transition Care Management Unsuccessful Follow-up Telephone Call  Date of discharge and from where:  12/11/2021, [facility]   Attempts:  2nd Attempt  Reason for unsuccessful TCM follow-up call:  Unable to reach patient

## 2021-12-19 ENCOUNTER — Encounter (INDEPENDENT_AMBULATORY_CARE_PROVIDER_SITE_OTHER): Payer: PPO | Admitting: Ophthalmology

## 2021-12-19 ENCOUNTER — Encounter (INDEPENDENT_AMBULATORY_CARE_PROVIDER_SITE_OTHER): Payer: Self-pay

## 2021-12-21 ENCOUNTER — Non-Acute Institutional Stay: Payer: PPO | Admitting: Hospice

## 2021-12-21 DIAGNOSIS — F32A Depression, unspecified: Secondary | ICD-10-CM | POA: Diagnosis not present

## 2021-12-21 DIAGNOSIS — G309 Alzheimer's disease, unspecified: Secondary | ICD-10-CM | POA: Diagnosis not present

## 2021-12-21 DIAGNOSIS — Z515 Encounter for palliative care: Secondary | ICD-10-CM

## 2021-12-21 DIAGNOSIS — M5441 Lumbago with sciatica, right side: Secondary | ICD-10-CM | POA: Diagnosis not present

## 2021-12-21 DIAGNOSIS — F419 Anxiety disorder, unspecified: Secondary | ICD-10-CM | POA: Diagnosis not present

## 2021-12-21 DIAGNOSIS — R531 Weakness: Secondary | ICD-10-CM

## 2021-12-21 DIAGNOSIS — F339 Major depressive disorder, recurrent, unspecified: Secondary | ICD-10-CM

## 2021-12-21 DIAGNOSIS — I5032 Chronic diastolic (congestive) heart failure: Secondary | ICD-10-CM

## 2021-12-21 DIAGNOSIS — J449 Chronic obstructive pulmonary disease, unspecified: Secondary | ICD-10-CM

## 2021-12-21 NOTE — Progress Notes (Signed)
[facility] Consult Note Telephone: [phone]  Fax: [phone]  PATIENT NAME: Ian [name] [address] [place] [address] [phone] (home)  DOB: 11/30/1935 MRN: [id number]  PRIMARY CARE PROVIDER:    Lauree Chandler, NP,  [place] [address] [phone]  REFERRING PROVIDER:   Lauree Chandler, NP [place],  [place] [address] [phone]  RESPONSIBLE PARTY:  Self/[name]     Name Relation Home Work Mobile   Sebastien, Jackson   [phone]   Lambert Son   [phone]        I met face to face with patient at facility. Visit to build trust and highlight Palliative Medicine as specialized medical care for people living with serious illness, aimed at facilitating better quality of life through symptoms relief, assisting with advance care planning and complex medical decision making.  This is the initial visit.   NP called Ian [name] and updated him on visit.  And Ian [name] would like palliative care to follow patient when he goes back home. ASSESSMENT AND / RECOMMENDATIONS:   Advance Care Planning: Our advance care planning conversation included a discussion about:    The value and importance of advance care planning  Difference between Hospice and Palliative care Exploration of goals of care in the event of a sudden injury or illness  Identification and preparation of a healthcare agent  Review and updating or creation of an  advance directive document . Decision not to resuscitate or to de-escalate disease focused treatments due to poor prognosis.  CODE STATUS: Discussion on ramifications and implications of code status. Patient affirmed he is a Do Not Resuscitate.   Goals of Care: Goals include to maximize quality of life and symptom management  I spent 16  minutes providing this initial consultation. More than 50% of the time in this consultation  was spent on counseling patient and coordinating communication. --------------------------------------------------------------------------------------------------------------------------------------  Symptom Management/Plan: COPD: exacerbated by recent severe sepsis secondary to community-acquired pneumonia, hospitalized 6/2 - 12/14/21. Completed antibiotics. Continue Albuterol,  budesonide formoterol, Symbicort as ordered; continue flutter valve, incentive spirometer, 02 supplementation as needed, wean oxygen. Encourage slow deep breathing, avoid triggers.  Weakness: Weakness worsened with recent hospitalization.  PT/OT is ongoing for strengthening and gait training. Fall precautions. Continue with rolling walker for support and to help prevent fall.  Depression: Continue paroxetine as ordered.  Encourage socialization and participation in activities of choice.  Psych consult as needed.  Routine CBC CMP Follow up: Palliative care will continue to follow for complex medical decision making, advance care planning, and clarification of goals. Return 6 weeks or prn. Encouraged to call provider sooner with any concerns.   Family /Caregiver/Community Supports: Patient in SNF for acute rehab  HOSPICE ELIGIBILITY/DIAGNOSIS: TBD  Chief Complaint: Initial Palliative care visit  HISTORY OF PRESENT ILLNESS:  Ian [name] is a 86 y.o. year old male  with multiple morbidities requiring close monitoring and with high risk of complications and  mortality:  COPD, HTN, HLD, chronic dCHF, stage IV CKD , chronic anemia, Type 2 DM.patient was [facility] to [facility] on [facility]07[facility]2021  with severe sepsis due community-acquired pneumonia.  Completed treatments and discharged to SNF for acute rehab. History obtained from review of EMR, discussion with primary team, caregiver, family and/or Ian [name].  Review and summarization of Epic records shows history from other than patient. Rest of 10 point ROS asked and negative.  Independent interpretation of tests and  reviewed as needed, available labs, patient records, imaging, studies and related documents from the EMR.  PAST MEDICAL HISTORY:  Active Ambulatory Problems    Diagnosis Date Noted   COPD with asthma ([place]) 12/03/2011   Allergic rhinitis 12/03/2011   Hypertension associated with diabetes ([place])    COPD (chronic obstructive pulmonary disease) (H[facility])    Hyperlipidemia    DM2 (diabetes mellitus, type 2) ([place]) 12/29/2012   Insomnia 01/07/2013   Hyperlipidemia associated with type 2 diabetes mellitus ([place]) 09/17/2013   Chronic kidney disease, stage 4 (severe) (H[facility]) 10/20/2013   Obesity (BMI 30-39.9) 02/03/2014   Branch retinal vein occlusion 06/28/2011   Degenerative drusen 07/07/2012   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   Chronic kidney disease (CKD) stage G3a/A2, moderately decreased glomerular filtration rate (GFR) between 45-59 mL/min/1.73 square meter and albuminuria creatinine ratio between 30-299 mg/g ([place]) 11/23/2015   Loss of weight 11/23/2015   Mild cognitive impairment with memory loss 11/04/2016   SBO (small bowel obstruction) ([place]) 12/31/2016   Chronic right-sided low back pain with right-sided sciatica 10/06/2017   Chronic diastolic CHF (congestive heart failure) ([place]) 04/07/2018   HTN (hypertension) 04/07/2018   Nasal congestion 12/18/2018   Depression with anxiety 12/18/2018   CAP (community acquired pneumonia) 12/07/2021   Severe sepsis ([place]) 12/07/2021   Acute renal failure superimposed on stage 4 chronic kidney disease ([place]) 99/37/1696   Acute metabolic encephalopathy 78/93/8101   Anemia, unspecified    Resolved Ambulatory Problems    Diagnosis Date Noted   Chronic cough 11/11/2011   Anxiety    Alzheimer's disease ([place])    Chronic maxillary sinusitis    Pneumonia 12/29/2012   UTI (lower urinary tract infection) 12/29/2012   Fluid overload 01/02/2013   Edema of hand 01/07/2013   Depression 01/07/2013   Cellulitis of left hand  02/12/2013   Cataract, nuclear 08/26/2014   Acute upper respiratory infection 07/11/2015   Cough 11/06/2017   Acute on chronic respiratory failure with hypoxia ([place]) 04/07/2018   CKD (chronic kidney disease), stage III 04/07/2018   COPD exacerbation ([place]) 04/07/2018   HLD (hyperlipidemia) 04/07/2018   Neck muscle strain 04/15/2019   Senile purpura ([place]) 01/24/2020   Past Medical History:  Diagnosis Date   Allergy    CKD (chronic kidney disease)    Diabetes mellitus    Edema    Hypercalcemia    Hypertension    Hypertrophy of prostate without urinary obstruction and other lower urinary tract symptoms (LUTS)    Hypertrophy of prostate without urinary obstruction and other lower urinary tract symptoms (LUTS)    Kidney disease    Unspecified disorder of kidney and ureter    Unspecified sleep apnea    Ventral hernia, unspecified, without mention of obstruction or gangrene     SOCIAL HX:  Social History   Tobacco Use   Smoking status: Former    Packs/day: 1.50    Years: 30.00    Total pack years: 45.00    Types: Cigarettes    Quit date: 07/08/1988    Years since quitting: 33.4    Passive exposure: Past   Smokeless tobacco: Never  Substance Use Topics   Alcohol use: No     FAMILY HX:  Family History  Problem Relation Age of Onset   Pancreatic cancer Sister    Heart disease Father    Hypertension Brother    Hypertension Sister       ALLERGIES:  Allergies  Allergen Reactions   Ace Inhibitors Cough  PERTINENT MEDICATIONS:  Outpatient Encounter Medications as of 12/21/2021  Medication Sig   atorvastatin (LIPITOR) 80 MG tablet TAKE 1 TABLET BY MOUTH EVERY DAY   acetaminophen (TYLENOL) 500 MG tablet Take 500 mg by mouth every 8 (eight) hours as needed for moderate pain.   albuterol (VENTOLIN HFA) 108 (90 Base) MCG/ACT inhaler Inhale 2 puffs into the lungs every 6 (six) hours as needed for wheezing or shortness of breath.   amLODipine (NORVASC) 10 MG tablet TAKE 1  TABLET BY MOUTH EVERY DAY (Patient taking differently: Take 10 mg by mouth daily.)   ASPIRIN LOW DOSE 81 MG EC tablet TAKE 1 TABLET BY MOUTH DAILY SWALLOW WHOLE (Patient taking differently: 81 mg daily.)   BESIVANCE 0.6 % SUSP Place 1 drop into the right eye 2 (two) times daily.   bisacodyl 5 MG EC tablet Take 1 tablet (5 mg total) by mouth daily as needed for moderate constipation.   blood glucose meter kit and supplies KIT Inject 1 each into the skin 2 (two) times daily. Dispense based on patient and insurance preference. Use up to four times daily as directed. (FOR ICD-9 250.00, 250.01).   diclofenac sodium (VOLTAREN) 1 % GEL Apply 2 g topically 4 (four) times daily as needed (shoulder pain). (Patient not taking: Reported on 12/07/2021)   fluticasone (FLONASE) 50 MCG/ACT nasal spray Place 1 spray into both nostrils daily.   glucose blood (ONE TOUCH ULTRA TEST) test strip Use to test blood sugar twice daily.  DX: E11.9   guaiFENesin (MUCINEX) 600 MG 12 hr tablet Take 2 tablets (1,200 mg total) by mouth 2 (two) times daily as needed for cough or to loosen phlegm.   hydrALAZINE (APRESOLINE) 25 MG tablet Take 1 tablet (25 mg total) by mouth 3 (three) times daily.   ipratropium-albuterol (DUONEB) 0.5-2.5 (3) MG/3ML SOLN Take 3 mLs by nebulization every 6 (six) hours as needed (wheezing).   Lancet Devices (ONE TOUCH DELICA LANCING DEV) MISC Use to test blood sugar once daily dx: E119   loratadine (CLARITIN) 10 MG tablet Take 1 tablet (10 mg total) by mouth daily. (Patient taking differently: Take 10 mg by mouth daily as needed for allergies.)   metoprolol tartrate (LOPRESSOR) 50 MG tablet TAKE 1 TABLET BY MOUTH TWICE A DAY (Patient taking differently: Take 50 mg by mouth 2 (two) times daily.)   montelukast (SINGULAIR) 10 MG tablet TAKE 1 TABLET BY MOUTH EVERYDAY AT BEDTIME (Patient taking differently: Take 10 mg by mouth at bedtime.)   Multiple Vitamin (MULTIVITAMIN WITH MINERALS) TABS tablet Take 1 tablet  by mouth daily.   Multiple Vitamins-Minerals (PRESERVISION AREDS PO) Take 1 tablet by mouth 2 (two) times daily.   PARoxetine (PAXIL) 40 MG tablet TAKE 1 TABLET BY MOUTH EVERY DAY (Patient taking differently: Take 40 mg by mouth daily.)   polyethylene glycol (MIRALAX / GLYCOLAX) 17 g packet Take 17 g by mouth daily.   senna-docusate (SENOKOT-S) 8.6-50 MG tablet Take 1 tablet by mouth 2 (two) times daily.   sodium chloride (OCEAN) 0.65 % SOLN nasal spray Place 1 spray into both nostrils daily as needed for congestion.   SYMBICORT 160-4.5 MCG/ACT inhaler Inhale 2 puffs into the lungs 2 (two) times daily.   trimethoprim-polymyxin b (POLYTRIM) ophthalmic solution Place 1 drop into the right eye every 6 (six) hours. 2 days after each monthly eye injection.   Vitamin D, Ergocalciferol, (DRISDOL) 1.25 MG (50000 UNIT) CAPS capsule TAKE 1 CAPSULE BY MOUTH ONE TIME PER WEEK (Patient taking  differently: Take 50,000 Units by mouth every 7 (seven) days.)   No facility-administered encounter medications on file as of 12/21/2021.    Thank you for the opportunity to participate in the care of Mr. Febus.  The palliative care team will continue to follow. Please call our office at [phone] if we can be of additional assistance.   Note: Portions of this note were generated with [employer]. Dictation errors may occur despite best attempts at proofreading.  Teodoro Spray, NP

## 2021-12-22 ENCOUNTER — Other Ambulatory Visit: Payer: Self-pay | Admitting: Nurse Practitioner

## 2021-12-22 ENCOUNTER — Other Ambulatory Visit: Payer: Self-pay | Admitting: Pulmonary Disease

## 2021-12-22 DIAGNOSIS — E1122 Type 2 diabetes mellitus with diabetic chronic kidney disease: Secondary | ICD-10-CM

## 2021-12-24 ENCOUNTER — Telehealth: Payer: Self-pay | Admitting: Oncology

## 2021-12-24 NOTE — Telephone Encounter (Signed)
Attempted to contact patient in regards to no show appointments/ in basket message. Trying to see if he would like to reschedule appointments. No answer so voicemail was left for call back.

## 2021-12-26 ENCOUNTER — Ambulatory Visit (INDEPENDENT_AMBULATORY_CARE_PROVIDER_SITE_OTHER): Payer: PPO | Admitting: Family

## 2021-12-26 ENCOUNTER — Encounter: Payer: Self-pay | Admitting: Family

## 2021-12-26 VITALS — BP 110/60 | HR 65 | Temp 97.7°F | Resp 18 | Ht 67.0 in | Wt 171.0 lb

## 2021-12-26 DIAGNOSIS — F418 Other specified anxiety disorders: Secondary | ICD-10-CM | POA: Diagnosis not present

## 2021-12-26 DIAGNOSIS — I129 Hypertensive chronic kidney disease with stage 1 through stage 4 chronic kidney disease, or unspecified chronic kidney disease: Secondary | ICD-10-CM | POA: Diagnosis not present

## 2021-12-26 DIAGNOSIS — I10 Essential (primary) hypertension: Secondary | ICD-10-CM | POA: Diagnosis not present

## 2021-12-26 DIAGNOSIS — E1169 Type 2 diabetes mellitus with other specified complication: Secondary | ICD-10-CM

## 2021-12-26 DIAGNOSIS — J449 Chronic obstructive pulmonary disease, unspecified: Secondary | ICD-10-CM

## 2021-12-26 DIAGNOSIS — E785 Hyperlipidemia, unspecified: Secondary | ICD-10-CM | POA: Diagnosis not present

## 2021-12-26 DIAGNOSIS — K5904 Chronic idiopathic constipation: Secondary | ICD-10-CM | POA: Diagnosis not present

## 2021-12-26 DIAGNOSIS — R051 Acute cough: Secondary | ICD-10-CM | POA: Diagnosis not present

## 2021-12-26 DIAGNOSIS — E1122 Type 2 diabetes mellitus with diabetic chronic kidney disease: Secondary | ICD-10-CM | POA: Diagnosis not present

## 2021-12-26 DIAGNOSIS — N184 Chronic kidney disease, stage 4 (severe): Secondary | ICD-10-CM | POA: Diagnosis not present

## 2021-12-26 DIAGNOSIS — M898X9 Other specified disorders of bone, unspecified site: Secondary | ICD-10-CM | POA: Diagnosis not present

## 2021-12-26 NOTE — Progress Notes (Signed)
Provider: Amilliana Hayworth FNP-C  Lauree Chandler, NP  Patient Care Team: Lauree Chandler, NP as PCP - General (Geriatric Medicine) Zebedee Iba., MD as Referring Physician (Ophthalmology) Chesley Mires, MD as Consulting Physician (Pulmonary Disease) Lavonna Monarch, MD as Consulting Physician (Dermatology) Ladell Pier, MD as Consulting Physician (Oncology)  Extended Emergency Contact Information Primary Emergency Contact: Storck,Mike Mobile Phone: [phone] Relation: Son Secondary Emergency Contact: [place] Mobile Phone: [phone] Relation: Son  Code Status:  Full Code  Goals of care: Advanced Directive information    12/26/2021    8:24 AM  Advanced Directives  Does Patient Have a Medical Advance Directive? Yes  Type of Advance Directive Living will;Healthcare Power of Attorney  Does patient want to make changes to medical advance directive? No - Patient declined     Chief Complaint  Patient presents with   Hospitalization Follow-up    Patient is here for a hospital follow up for breathing issues and Pneumonia.PT has several immunization care gaps if possible for discussion.     HPI:  Pt is a 86 y.o. male seen today for an acute visit for Hospital follow up 12/07/2021 - 12/14/2021  for Pneumonia.  He has a medical history of essential hypertension with chronic kidney disease stage IV, COPD, type 2 diabetes, chronic diastolic congestive heart failure, and anemia of chronic disease, depression with anxiety, chronic kidney disease stage IV, seasonal allergies among others.  He presented with leukocytosis WBCs 12.9 tachycardia encephalopathy with renal failure.  He had been treated on outpatient with doxycycline for pneumonia without any improvement.  Also had hypoxia initially required 4 L of oxygen but later was weaned down to 2 L he was treated with Rocephin and Zithromax.  Sepsis resolved.  He was encouraged to use incentive  spirometer.  He was advised to use nighttime oxygen to keep O2 sats around 90's.  His creatinine was 3.65, BUN 69 trended down to creatinine 3.17 and BUN 61 he was advised to follow-up with nephrologist Dr. Posey Pronto in 2 weeks.  States has an appointment this afternoon with a nephrologist.  He also had complaints of right leg pain right knee, ankle and foot pain unable to bear weight initially.  Right knee ankle and foot x-ray showed no acute abnormalities except small Achilles and calcaneal spurs, mild hammertoe deformities and vascular calcifications.  Right lower leg venous Doppler was negative for DVT and soft tissue ultrasound also showed diffuse edema with no loculated abscess or any soft tissue masses.  Does not consistent with cellulitis.  He was advised to elevate leg and outpatient MRI if symptoms persisted.  He denies any knee ankle or foot pain.  States symptoms have resolved.  His been walking without any difficulties. He was evaluated by physical therapy who recommended SNF He was discharged to [facility]  12/22/2021 no records for review from SNF.  Outpatient palliative care also recommended.  Patient was seen by palliative care on 12/21/2021  States doing much better. Reports CBG ranging in the 120's -130's.  His oxygen level is 89% on room air today.  He did not bring his portable oxygen to visit.  He denies any shortness of breath or wheezing.  Past Medical History:  Diagnosis Date   Allergy    Alzheimer's disease ([place])    Anemia, unspecified    Anxiety    Chronic maxillary sinusitis    CKD (chronic kidney disease)    3b/4  COPD (chronic obstructive pulmonary disease) ([facility])    Depression    Diabetes mellitus    Edema    Hypercalcemia    Hyperlipidemia    Hypertension    Hypertrophy of prostate without urinary obstruction and other lower urinary tract symptoms (LUTS)    Hypertrophy of prostate without urinary obstruction and other lower urinary  tract symptoms (LUTS)    Kidney disease    mild   Unspecified disorder of kidney and ureter    Unspecified sleep apnea    Ventral hernia, unspecified, without mention of obstruction or gangrene    Past Surgical History:  Procedure Laterality Date   EXPLORATORY LAPAROTOMY  2002   HERNIA REPAIR      Allergies  Allergen Reactions   Ace Inhibitors Cough    Outpatient Encounter Medications as of 12/26/2021  Medication Sig   acetaminophen (TYLENOL) 500 MG tablet Take 500 mg by mouth every 8 (eight) hours as needed for moderate pain.   albuterol (VENTOLIN HFA) 108 (90 Base) MCG/ACT inhaler Inhale 2 puffs into the lungs every 6 (six) hours as needed for wheezing or shortness of breath.   amLODipine (NORVASC) 10 MG tablet TAKE 1 TABLET BY MOUTH EVERY DAY   aspirin EC (ASPIRIN LOW DOSE) 81 MG tablet TAKE 1 TABLET BY MOUTH EVERY DAY --SWALLOW WHOLE   atorvastatin (LIPITOR) 80 MG tablet TAKE 1 TABLET BY MOUTH EVERY DAY   BESIVANCE 0.6 % SUSP Place 1 drop into the right eye 2 (two) times daily.   bisacodyl 5 MG EC tablet Take 1 tablet (5 mg total) by mouth daily as needed for moderate constipation.   blood glucose meter kit and supplies KIT Inject 1 each into the skin 2 (two) times daily. Dispense based on patient and insurance preference. Use up to four times daily as directed. (FOR ICD-9 250.00, 250.01).   fluticasone (FLONASE) 50 MCG/ACT nasal spray SPRAY 1 SPRAY INTO BOTH NOSTRILS DAILY.   glucose blood (ONE TOUCH ULTRA TEST) test strip Use to test blood sugar twice daily.  DX: E11.9   guaiFENesin (MUCINEX) 600 MG 12 hr tablet Take 2 tablets (1,200 mg total) by mouth 2 (two) times daily as needed for cough or to loosen phlegm.   hydrALAZINE (APRESOLINE) 25 MG tablet Take 1 tablet (25 mg total) by mouth 3 (three) times daily.   ipratropium-albuterol (DUONEB) 0.5-2.5 (3) MG/3ML SOLN Take 3 mLs by nebulization every 6 (six) hours as needed (wheezing).   Lancet Devices (ONE TOUCH DELICA LANCING  DEV) MISC Use to test blood sugar once daily dx: E119   loratadine (CLARITIN) 10 MG tablet Take 1 tablet (10 mg total) by mouth daily.   metoprolol tartrate (LOPRESSOR) 50 MG tablet TAKE 1 TABLET BY MOUTH TWICE A DAY   montelukast (SINGULAIR) 10 MG tablet TAKE 1 TABLET BY MOUTH EVERYDAY AT BEDTIME   Multiple Vitamin (MULTIVITAMIN WITH MINERALS) TABS tablet Take 1 tablet by mouth daily.   Multiple Vitamins-Minerals (PRESERVISION AREDS PO) Take 1 tablet by mouth 2 (two) times daily.   PARoxetine (PAXIL) 40 MG tablet TAKE 1 TABLET BY MOUTH EVERY DAY   polyethylene glycol (MIRALAX / GLYCOLAX) 17 g packet Take 17 g by mouth daily.   senna-docusate (SENOKOT-S) 8.6-50 MG tablet Take 1 tablet by mouth 2 (two) times daily.   sodium chloride (OCEAN) 0.65 % SOLN nasal spray Place 1 spray into both nostrils daily as needed for congestion.   SYMBICORT 160-4.5 MCG/ACT inhaler Inhale 2 puffs into the lungs 2 (two) times  daily.   trimethoprim-polymyxin b (POLYTRIM) ophthalmic solution Place 1 drop into the right eye every 6 (six) hours. 2 days after each monthly eye injection.   Vitamin D, Ergocalciferol, (DRISDOL) 1.25 MG (50000 UNIT) CAPS capsule TAKE 1 CAPSULE BY MOUTH ONE TIME PER WEEK   [DISCONTINUED] diclofenac sodium (VOLTAREN) 1 % GEL Apply 2 g topically 4 (four) times daily as needed (shoulder pain). (Patient not taking: Reported on 12/07/2021)   No facility-administered encounter medications on file as of 12/26/2021.    Review of Systems  Constitutional:  Negative for appetite change, chills, fatigue, fever and unexpected weight change.  HENT:  Negative for congestion, dental problem, ear discharge, ear pain, facial swelling, hearing loss, nosebleeds, postnasal drip, rhinorrhea, sinus pressure, sinus pain, sneezing, sore throat, tinnitus and trouble swallowing.   Eyes:  Negative for pain, discharge, redness, itching and visual disturbance.  Respiratory:  Negative for chest tightness, shortness of breath  and wheezing.        Cough has resolved.wears portable oxygen twice daily whenever he really needs it.oxygen sat today 89 % on room air   Cardiovascular:  Negative for chest pain, palpitations and leg swelling.  Gastrointestinal:  Negative for abdominal distention, abdominal pain, blood in stool, constipation, diarrhea, nausea and vomiting.  Endocrine: Negative for cold intolerance, heat intolerance, polydipsia, polyphagia and polyuria.  Genitourinary:  Negative for difficulty urinating, dysuria, flank pain, frequency and urgency.  Musculoskeletal:  Negative for arthralgias, back pain, gait problem, joint swelling, myalgias, neck pain and neck stiffness.  Skin:  Negative for color change, pallor, rash and wound.  Neurological:  Negative for dizziness, syncope, speech difficulty, weakness, light-headedness, numbness and headaches.  Hematological:  Does not bruise/bleed easily.  Psychiatric/Behavioral:  Negative for agitation, behavioral problems, confusion, hallucinations, self-injury, sleep disturbance and suicidal ideas. The patient is not nervous/anxious.     Immunization History  Administered Date(s) Administered   Fluad Quad(high Dose 65+) 05/27/2019, 03/30/2020, 06/25/2021   Influenza Split 04/08/2011   Influenza Whole 04/07/2012   Influenza, High Dose Seasonal PF 04/07/2017, 05/25/2018, 07/22/2019   Influenza,inj,Quad PF,6+ Mos 04/23/2013, 03/28/2014, 02/23/2016   Influenza-Unspecified 04/08/2015, 07/09/2015, 04/08/2017   Moderna Sars-Covid-2 Vaccination 10/07/2019, 11/07/2019, 05/10/2020   Pneumococcal Conjugate-13 06/06/2014   Pneumococcal-Unspecified 07/09/2007   Td 08/09/1997   Tdap 08/12/2011   Pertinent  Health Maintenance Due  Topic Date Due   OPHTHALMOLOGY EXAM  10/04/2021   HEMOGLOBIN A1C  12/24/2021   INFLUENZA VACCINE  02/05/2022   FOOT EXAM  06/25/2022      12/12/2021    7:24 PM 12/13/2021    8:55 AM 12/13/2021    8:26 PM 12/14/2021   11:00 AM 12/26/2021   10:50 AM   Fall Risk  Falls in the past year?     0  Was there an injury with Fall?     0  Fall Risk Category Calculator     0  Fall Risk Category     Low  Patient Fall Risk Level _0   Patient at Risk for Falls Due to     History of fall(s);Impaired balance/gait  Fall risk Follow up     Falls evaluation completed   Functional Status Survey:    Vitals:   12/26/21 1046  BP: 110/60  Pulse: 65  Resp: 18  Temp: 97.7 F (36.5 C)  TempSrc: Temporal  SpO2: (!) 86%  Weight: 171 lb (77.6 kg)  Height: _1  (1.702 m)  Body mass index is 26.78 kg/m. Physical Exam Vitals reviewed.  Constitutional:      General: He is not in acute distress.    Appearance: Normal appearance. He is normal weight. He is not ill-appearing or diaphoretic.  HENT:     Head: Normocephalic.     Comments: Hearing aids in place bilaterally     Right Ear: Tympanic membrane, ear canal and external ear normal. There is no impacted cerumen.     Left Ear: Tympanic membrane, ear canal and external ear normal. There is no impacted cerumen.     Nose: Nose normal. No congestion or rhinorrhea.     Mouth/Throat:     Mouth: Mucous membranes are moist.     Pharynx: Oropharynx is clear. No oropharyngeal exudate or posterior oropharyngeal erythema.  Eyes:     General: No scleral icterus.       Right eye: No discharge.        Left eye: No discharge.     Extraocular Movements: Extraocular movements intact.     Conjunctiva/sclera: Conjunctivae normal.     Pupils: Pupils are equal, round, and reactive to light.  Neck:     Vascular: No carotid bruit.  Cardiovascular:     Rate and Rhythm: Normal rate and regular rhythm.     Pulses: Normal pulses.     Heart sounds: Normal heart sounds. No murmur heard.    No friction rub. No gallop.  Pulmonary:     Effort: Pulmonary effort is normal. No respiratory distress.     Breath sounds: Decreased air movement present.  Examination of the right-lower field reveals decreased breath sounds. Examination of the left-lower field reveals decreased breath sounds. Decreased breath sounds present. No wheezing, rhonchi or rales.  Chest:     Chest wall: No tenderness.  Abdominal:     General: Bowel sounds are normal. There is no distension.     Palpations: Abdomen is soft. There is no mass.     Tenderness: There is no abdominal tenderness. There is no right CVA tenderness, left CVA tenderness, guarding or rebound.     Hernia: A hernia is present.  Musculoskeletal:        General: No swelling or tenderness. Normal range of motion.     Cervical back: Normal range of motion. No rigidity or tenderness.     Right lower leg: No edema.     Left lower leg: No edema.  Lymphadenopathy:     Cervical: No cervical adenopathy.  Skin:    General: Skin is warm and dry.     Coloration: Skin is not pale.     Findings: No bruising, erythema, lesion or rash.  Neurological:     Mental Status: He is alert. Mental status is at baseline.     Motor: No weakness.     Gait: Gait normal.  Psychiatric:        Mood and Affect: Mood normal.        Speech: Speech normal.        Behavior: Behavior normal.        Thought Content: Thought content normal.        Judgment: Judgment normal.     Labs reviewed: Recent Labs    12/08/21 0507 12/09/21 0437 12/10/21 0418 12/11/21 0436 12/13/21 0432  NA 140   < > 142 141 140  K 4.1   < > 4.4 4.5 4.3  CL 114*   < > 114* 110 108  CO2 20*   < >  _0 GLUCOSE 127*   < > 126* 112* 123*  BUN 61*   < > 43* 37* 45*  CREATININE 3.17*   < > 2.68* 2.72* 2.78*  CALCIUM 9.6   < > 9.9 10.2 10.1  MG 2.3  --   --   --   --   PHOS 3.6  --   --   --   --    < > = values in this interval not displayed.   Recent Labs    12/09/21 0437 12/10/21 0418 12/11/21 0436  AST _1 ALT _2 ALKPHOS 56 55 58  BILITOT 0.7 0.7 0.7  PROT 6.0* 5.9* 6.3*  ALBUMIN 2.8* 2.7* 2.9*   Recent Labs     08/06/21 1345 12/07/21 1504 12/08/21 0507 12/09/21 0437 12/10/21 0418 12/11/21 0436  WBC 7.7 12.9* 10.9* 10.2 8.6 9.7  NEUTROABS 3.9 9.2* 7.4  --   --   --   HGB 11.0* 9.9* 10.0* 9.8* 9.5* 10.6*  HCT 34.0* 31.2* 31.4* 30.4* 28.8* 31.8*  MCV 101.2* 101.6* 103.6* 103.1* 101.1* 99.7  PLT 145* 173 149* 155 149* 167   Lab Results  Component Value Date   TSH 1.670 12/07/2021   Lab Results  Component Value Date   HGBA1C 5.8 (H) 06/25/2021   Lab Results  Component Value Date   CHOL 164 10/10/2020   HDL 42 10/10/2020   LDLCALC 99 10/10/2020   TRIG 129 10/10/2020   CHOLHDL 3.9 10/10/2020    Significant Diagnostic Results in last 30 days:  ECHOCARDIOGRAM COMPLETE  Result Date: 12/13/2021    ECHOCARDIOGRAM REPORT   Patient Name:   VUK SKILLERN Date of Exam: 12/13/2021 Medical Rec #:  [id number]  Height:       67.0 in Accession #:    [id number] Weight:       164.7 lb Date of Birth:  [birth year]  BSA:          1.862 m Patient Age:    86 years   BP:           159/79 mmHg Patient Gender: M          HR:           62 bpm. Exam Location:  Inpatient Procedure: 2D Echo, Cardiac Doppler and Color Doppler Indications:    Congestive heart failure  History:        Patient has prior history of Echocardiogram examinations, most                 recent 01/04/2013. COPD; Risk Factors:Diabetes and Hypertension.  Sonographer:    Joette Catching RCS Referring Phys: [phone] [place]  1. Left ventricular ejection fraction, by estimation, is 60 to 65%. The left ventricle has normal function. The left ventricle has no regional wall motion abnormalities. There is moderate concentric left ventricular hypertrophy. Left ventricular diastolic parameters are consistent with Grade I diastolic dysfunction (impaired relaxation). Elevated left ventricular end-diastolic pressure.  2. Right ventricular systolic function is normal. The right ventricular size is normal. There is normal pulmonary artery systolic pressure.  3. Left  atrial size was severely dilated.  4. The pericardial effusion is circumferential.  5. The mitral valve is normal in structure. Trivial mitral valve regurgitation. No evidence of mitral stenosis.  6. The aortic valve is tricuspid. There is mild calcification of the aortic valve. There is mild thickening of the aortic valve. Aortic valve regurgitation is not visualized. Mild  aortic valve stenosis. Aortic valve area, by VTI measures 1.32 cm. Aortic valve mean gradient measures 15.0 mmHg. Aortic valve Vmax measures 2.69 m/s.  7. The inferior vena cava is normal in size with greater than 50% respiratory variability, suggesting right atrial pressure of 3 mmHg. FINDINGS  Left Ventricle: Left ventricular ejection fraction, by estimation, is 60 to 65%. The left ventricle has normal function. The left ventricle has no regional wall motion abnormalities. The left ventricular internal cavity size was normal in size. There is  moderate concentric left ventricular hypertrophy. Left ventricular diastolic parameters are consistent with Grade I diastolic dysfunction (impaired relaxation). Elevated left ventricular end-diastolic pressure. Right Ventricle: The right ventricular size is normal. No increase in right ventricular wall thickness. Right ventricular systolic function is normal. There is normal pulmonary artery systolic pressure. The tricuspid regurgitant velocity is 1.79 m/s, and  with an assumed right atrial pressure of 3 mmHg, the estimated right ventricular systolic pressure is 26.7 mmHg. Left Atrium: Left atrial size was severely dilated. Right Atrium: Right atrial size was normal in size. Pericardium: Trivial pericardial effusion is present. The pericardial effusion is circumferential. Mitral Valve: The mitral valve is normal in structure. Trivial mitral valve regurgitation. No evidence of mitral valve stenosis. Tricuspid Valve: The tricuspid valve is normal in structure. Tricuspid valve regurgitation is trivial. No  evidence of tricuspid stenosis. Aortic Valve: The aortic valve is tricuspid. There is mild calcification of the aortic valve. There is mild thickening of the aortic valve. Aortic valve regurgitation is not visualized. Mild aortic stenosis is present. Aortic valve mean gradient measures  15.0 mmHg. Aortic valve peak gradient measures 28.9 mmHg. Aortic valve area, by VTI measures 1.32 cm. Pulmonic Valve: The pulmonic valve was normal in structure. Pulmonic valve regurgitation is mild. No evidence of pulmonic stenosis. Aorta: The aortic root is normal in size and structure. Venous: The inferior vena cava is normal in size with greater than 50% respiratory variability, suggesting right atrial pressure of 3 mmHg. IAS/Shunts: No atrial level shunt detected by color flow Doppler.  LEFT VENTRICLE PLAX 2D LVIDd:         5.70 cm   Diastology LVIDs:         3.50 cm   LV e' medial:    3.38 cm/s LV PW:         1.30 cm   LV E/e' medial:  18.2 LV IVS:        1.50 cm   LV e' lateral:   3.19 cm/s LVOT diam:     2.00 cm   LV E/e' lateral: 19.3 LV SV:         78 LV SV Index:   42 LVOT Area:     3.14 cm  RIGHT VENTRICLE             IVC RV Basal diam:  4.10 cm     IVC diam: 1.30 cm RV Mid diam:    2.40 cm RV S prime:     14.80 cm/s TAPSE (M-mode): 2.4 cm LEFT ATRIUM              Index        RIGHT ATRIUM           Index LA diam:        4.20 cm  2.26 cm/m   RA Area:     16.30 cm LA Vol (A2C):   105.0 ml 56.39 ml/m  RA Volume:   37.90 ml  20.35 ml/m  LA Vol (A4C):   54.7 ml  29.38 ml/m LA Biplane Vol: 79.0 ml  42.43 ml/m  AORTIC VALVE                     PULMONIC VALVE AV Area (Vmax):    1.13 cm      PV Vmax:          0.98 m/s AV Area (Vmean):   1.25 cm      PV Peak grad:     3.8 mmHg AV Area (VTI):     1.32 cm      PR End Diast Vel: 6.25 msec AV Vmax:           269.00 cm/s AV Vmean:          172.600 cm/s AV VTI:            0.592 m AV Peak Grad:      28.9 mmHg AV Mean Grad:      15.0 mmHg LVOT Vmax:         96.80 cm/s LVOT  Vmean:        68.900 cm/s LVOT VTI:          0.249 m LVOT/AV VTI ratio: 0.42  AORTA Ao Root diam: 3.40 cm Ao Asc diam:  3.00 cm MITRAL VALVE                TRICUSPID VALVE MV Area (PHT): 3.77 cm     TR Peak grad:   12.8 mmHg MV Decel Time: 201 msec     TR Vmax:        179.00 cm/s MV E velocity: 61.60 cm/s MV A velocity: 106.00 cm/s  SHUNTS MV E/A ratio:  0.58         Systemic VTI:  0.25 m                             Systemic Diam: 2.00 cm Skeet Latch MD Electronically signed by Skeet Latch MD Signature Date/Time: 12/13/2021/4:10:01 PM    Final    [place] RT LOWER EXTREM LTD SOFT TISSUE NON VASCULAR  Result Date: 12/13/2021 CLINICAL DATA:  Pain and swelling right foot EXAM: ULTRASOUND right LOWER EXTREMITY LIMITED TECHNIQUE: Ultrasound examination of the lower extremity soft tissues was performed in the area of clinical concern. COMPARISON:  None Available. FINDINGS: There is subcutaneous edema in the area of patient's symptoms in the right ankle and right foot. There is no loculated fluid collection or demonstrable hematoma. IMPRESSION: There is diffuse edema in the subcutaneous plane in the right ankle and right foot suggesting cellulitis or contusion. There is no demonstrable loculated abscess or any soft tissue masses in the area of patient's symptoms. Electronically Signed   By: Elmer Picker M.D.   On: 12/13/2021 12:53   VAS [place] LOWER EXTREMITY VENOUS (DVT)  Result Date: 12/12/2021  Lower Venous DVT Study Patient Name:  MIKEN STECHER  Date of Exam:   12/12/2021 Medical Rec #: [id number]   Accession #:    [id number] Date of Birth: 01/14/1936   Patient Gender: M Patient Age:   23 years Exam [facility]  [facility] Procedure:      VAS [place] LOWER EXTREMITY VENOUS (DVT) Referring Phys: Annamaria Boots XU --------------------------------------------------------------------------------  Indications: Pain.  Risk Factors: None identified. Comparison Study: No prior studies. Performing Technologist: Oliver Hum  RVT  Examination Guidelines: A complete evaluation includes B-mode imaging, spectral Doppler, color Doppler,  and power Doppler as needed of all accessible portions of each vessel. Bilateral testing is considered an integral part of a complete examination. Limited examinations for reoccurring indications may be performed as noted. The reflux portion of the exam is performed with the patient in reverse Trendelenburg.  +---------+---------------+---------+-----------+----------+--------------+ RIGHT    CompressibilityPhasicitySpontaneityPropertiesThrombus Aging +---------+---------------+---------+-----------+----------+--------------+ CFV      Full           Yes      Yes                                 +---------+---------------+---------+-----------+----------+--------------+ SFJ      Full                                                        +---------+---------------+---------+-----------+----------+--------------+ FV Prox  Full                                                        +---------+---------------+---------+-----------+----------+--------------+ FV Mid   Full                                                        +---------+---------------+---------+-----------+----------+--------------+ FV DistalFull                                                        +---------+---------------+---------+-----------+----------+--------------+ PFV      Full                                                        +---------+---------------+---------+-----------+----------+--------------+ POP      Full           Yes      Yes                                 +---------+---------------+---------+-----------+----------+--------------+ PTV      Full                                                        +---------+---------------+---------+-----------+----------+--------------+ PERO     Full                                                         +---------+---------------+---------+-----------+----------+--------------+   +----+---------------+---------+-----------+----------+--------------+  LEFTCompressibilityPhasicitySpontaneityPropertiesThrombus Aging +----+---------------+---------+-----------+----------+--------------+ CFV Full           Yes      Yes                                 +----+---------------+---------+-----------+----------+--------------+     Summary: RIGHT: - There is no evidence of deep vein thrombosis in the lower extremity.  - A cystic structure, measuring 1.4 cm high by 3.8 cm wide by 3.9 cm long, is found in the popliteal fossa.  LEFT: - No evidence of common femoral vein obstruction.  *See table(s) above for measurements and observations. Electronically signed by Harold Barban MD on 12/12/2021 at 10:57:35 PM.    Final    DG Ankle 2 Views Right  Result Date: 12/12/2021 CLINICAL DATA:  Pain EXAM: RIGHT ANKLE - 2 VIEW COMPARISON:  None Available. FINDINGS: Nonstandard projections. There is no evidence of fracture, dislocation, or joint effusion. There is no evidence of arthropathy or other focal bone abnormality. Soft tissues are unremarkable. IMPRESSION: Negative. Electronically Signed   By: Lucrezia Europe M.D.   On: 12/12/2021 15:16   DG Knee 1-2 Views Right  Result Date: 12/12/2021 CLINICAL DATA:  Pain. EXAM: RIGHT KNEE - 1-2 VIEW COMPARISON:  None Available. FINDINGS: No evidence of fracture, dislocation, or joint effusion. There is mild medial compartment joint space narrowing. There is mediolateral compartment chondrocalcinosis. Peripheral vascular calcifications are seen. IMPRESSION: 1. No acute bony abnormality. 2. Mild degenerative changes. Electronically Signed   By: Ronney Asters M.D.   On: 12/12/2021 15:13   DG Chest 2 View  Result Date: 12/12/2021 CLINICAL DATA:  Dementia.  Renal failure. EXAM: CHEST - 2 VIEW COMPARISON:  12/07/2021 FINDINGS: Cardiomegaly as seen previously. Aortic atherosclerosis.  Pulmonary venous hypertension with mild interstitial edema. Tiny amount of fluid in the posterior costophrenic angles. No consolidation or lobar collapse. No abnormal bone finding. IMPRESSION: Findings consistent with fluid overload/congestive heart failure. Cardiomegaly and aortic atherosclerosis. Pulmonary venous hypertension with mild interstitial edema and a small amount of pleural fluid. Electronically Signed   By: Nelson Chimes M.D.   On: 12/12/2021 15:12   DG Foot Complete Right  Result Date: 12/11/2021 CLINICAL DATA:  [id number] y.o. male c/o right foot pain after taking off the compression boots, patient states pain is on the medial/plantar aspect of right foot, near base of the 5th metatarsal. EXAM: RIGHT FOOT COMPLETE - 3+ VIEW COMPARISON:  None Available. FINDINGS: Small Achilles and calcaneal spurs. Mild hammertoe deformities. Vascular calcifications. No periosteal reaction or callus deposition. IMPRESSION: No acute osseous abnormality. Electronically Signed   By: Abigail Miyamoto M.D.   On: 12/11/2021 11:38   DG Chest 2 View  Result Date: 12/07/2021 CLINICAL DATA:  Coughing up mucus for a couple weeks EXAM: CHEST - 2 VIEW COMPARISON:  Chest radiograph 11/27/2021 FINDINGS: The heart is at the upper limits of normal for size, unchanged. The upper mediastinal contours are normal. The lungs hyperinflated with flattening of the diaphragms suggesting underlying COPD. Patchy retrocardiac opacities are again seen in the lateral projection which could reflect persistent infection. There is no other focal airspace disease. There is no pleural effusion or pneumothorax. There is no acute osseous abnormality. IMPRESSION: Persistent patchy opacities in the retrocardiac region seen on the lateral projection are suspicious for persistent infection. Recommend follow-up radiographs in 6-8 weeks to assess for resolution. Electronically Signed   By: Valetta Mole M.D.   On: 12/07/2021  15:26   DG Chest 2 View  Result  Date: 11/28/2021 CLINICAL DATA:  Cough.  Shortness of breath. EXAM: CHEST - 2 VIEW COMPARISON:  December 27, 2020 FINDINGS: There is a calcified nodule in the periphery of the left upper lobe. No other suspicious nodules or masses. Increased left retrocardiac opacity in the interval based on the lateral view. No other infiltrates. Hyperinflation of the lungs remains. Cardiomegaly remains. The hila and mediastinum are unchanged. No other acute abnormalities. IMPRESSION: 1. Increased left retrocardiac opacity worrisome for pneumonia given history. Recommend short-term follow-up imaging after treatment to ensure resolution. 2. Hyperinflation of the lungs consistent with COPD or emphysema. 3. Stable cardiomegaly. 4. No other significant abnormalities. Electronically Signed   By: Dorise Bullion III M.D.   On: 11/28/2021 13:36    Assessment/Plan 1. Type 2 diabetes mellitus with stage 4 chronic kidney disease, without long-term current use of insulin (HCC) Lab Results  Component Value Date   HGBA1C 5.8 (H) 06/25/2021  Home CBG well controlled -Continue with dietary control  2. Essential hypertension Blood pressure well controlled -Continue on Metroprolol, hydralazine and amlodipine - CBC with Differential/Platelet - CMP with eGFR([facility])  3. Hyperlipidemia associated with type 2 diabetes mellitus ([place]) Latest LDL at goal -Continue on atorvastatin  4. CKD stage 4 due to type 2 diabetes mellitus (HCC) Creatinine was 3.65, BUN 69 trended down to creatinine 3.17 and BUN 61 during hospital admission. -Follow-up with nephrology Dr. Posey Pronto as directed -  continue to avoid Nephrotoxins and dose all other medication for renal clearance    5. Chronic idiopathic constipation - encouraged to increase fiber in diet  - continue on stool softener  6. Depression with anxiety Mood stable -Continue on Paxil  7. COPD with asthma ([place]) Breathing stable -Continue on oxygen 2 L nasal cannula at bedtime keep O2  sats above 90% -Continue on Symbicort and albuterol -Continue on Singulair  8. Acute cough Cough has improved status post hospitalization from 12/07/2021 to 12/14/2021 for community-acquired pneumonia.Completed 5 days of Rocephin and Zithromax.  Follow-up checks x-ray recommended in 6 to 8 weeks.Chest  x-ray ordered this visit advised patient to get chest X-ray at [facility] at [address] on 01/28/2022  then will call you with results. - DG Chest 2 View; Future  Family/ staff Communication: Reviewed plan of care with patient verbalized understanding  Labs/tests ordered:  - CBC with Differential/Platelet - CMP with eGFR([facility]) - DG Chest 2 View; Future  Next Appointment: Has appointment 01/11/2022 with PCP Dani Gobble   Sandrea Hughs, NP

## 2021-12-26 NOTE — Patient Instructions (Signed)
Please get chest X-ray for follow up Pneumonia at - Please get abdominal X-ray at [place] at [facility] then will call you with results.

## 2021-12-27 ENCOUNTER — Telehealth: Payer: Self-pay

## 2021-12-27 LAB — COMPLETE METABOLIC PANEL WITH GFR
AG Ratio: 1.3 (calc) (ref 1.0–2.5)
ALT: 18 U/L (ref 9–46)
AST: 17 U/L (ref 10–35)
Albumin: 3.2 g/dL — ABNORMAL LOW (ref 3.6–5.1)
Alkaline phosphatase (APISO): 66 U/L (ref 35–144)
BUN/Creatinine Ratio: 16 (calc) (ref 6–22)
BUN: 49 mg/dL — ABNORMAL HIGH (ref 7–25)
CO2: 23 mmol/L (ref 20–32)
Calcium: 9.1 mg/dL (ref 8.6–10.3)
Chloride: 111 mmol/L — ABNORMAL HIGH (ref 98–110)
Creat: 3.07 mg/dL — ABNORMAL HIGH (ref 0.70–1.22)
Globulin: 2.4 g/dL (calc) (ref 1.9–3.7)
Glucose, Bld: 112 mg/dL (ref 65–139)
Potassium: 4.7 mmol/L (ref 3.5–5.3)
Sodium: 142 mmol/L (ref 135–146)
Total Bilirubin: 0.3 mg/dL (ref 0.2–1.2)
Total Protein: 5.6 g/dL — ABNORMAL LOW (ref 6.1–8.1)
eGFR: 19 mL/min/{1.73_m2} — ABNORMAL LOW (ref >=60)

## 2021-12-27 LAB — CBC WITH DIFFERENTIAL/PLATELET
Absolute Monocytes: 752 cells/uL (ref 200–950)
Basophils Absolute: 88 cells/uL (ref 0–200)
Basophils Relative: 1.2 %
Eosinophils Absolute: 350 cells/uL (ref 15–500)
Eosinophils Relative: 4.8 %
HCT: 28.3 % — ABNORMAL LOW (ref 38.5–50.0)
Hemoglobin: 9.3 g/dL — ABNORMAL LOW (ref 13.2–17.1)
Lymphs Abs: 2139 cells/uL (ref 850–3900)
MCH: 32.7 pg (ref 27.0–33.0)
MCHC: 32.9 g/dL (ref 32.0–36.0)
MCV: 99.6 fL (ref 80.0–100.0)
MPV: 9.9 fL (ref 7.5–12.5)
Monocytes Relative: 10.3 %
Neutro Abs: 3971 cells/uL (ref 1500–7800)
Neutrophils Relative %: 54.4 %
Platelets: 190 10*3/uL (ref 140–400)
RBC: 2.84 10*6/uL — ABNORMAL LOW (ref 4.20–5.80)
RDW: 13 % (ref 11.0–15.0)
Total Lymphocyte: 29.3 %
WBC: 7.3 10*3/uL (ref 3.8–10.8)

## 2021-12-27 NOTE — Telephone Encounter (Signed)
Spoke with patient's son [name] and scheduled a in person Palliative Consult for 01/17/22 @ 12 PM per son's request. Last seen at facility on 12/21/21.  Consent obtained; updated Netsmart, Team List and Epic.

## 2022-01-02 ENCOUNTER — Encounter (INDEPENDENT_AMBULATORY_CARE_PROVIDER_SITE_OTHER): Payer: PPO | Admitting: Ophthalmology

## 2022-01-02 DIAGNOSIS — H34832 Tributary (branch) retinal vein occlusion, left eye, with macular edema: Secondary | ICD-10-CM | POA: Diagnosis not present

## 2022-01-02 DIAGNOSIS — H353211 Exudative age-related macular degeneration, right eye, with active choroidal neovascularization: Secondary | ICD-10-CM | POA: Diagnosis not present

## 2022-01-02 DIAGNOSIS — H43813 Vitreous degeneration, bilateral: Secondary | ICD-10-CM

## 2022-01-02 DIAGNOSIS — H353122 Nonexudative age-related macular degeneration, left eye, intermediate dry stage: Secondary | ICD-10-CM | POA: Diagnosis not present

## 2022-01-02 DIAGNOSIS — H35033 Hypertensive retinopathy, bilateral: Secondary | ICD-10-CM

## 2022-01-02 DIAGNOSIS — I10 Essential (primary) hypertension: Secondary | ICD-10-CM | POA: Diagnosis not present

## 2022-01-10 DIAGNOSIS — M5441 Lumbago with sciatica, right side: Secondary | ICD-10-CM | POA: Diagnosis not present

## 2022-01-11 ENCOUNTER — Ambulatory Visit (INDEPENDENT_AMBULATORY_CARE_PROVIDER_SITE_OTHER): Payer: PPO | Admitting: Nurse Practitioner

## 2022-01-11 ENCOUNTER — Encounter: Payer: Self-pay | Admitting: Nurse Practitioner

## 2022-01-11 VITALS — BP 118/60 | HR 83 | Temp 97.5°F | Resp 20 | Ht 67.0 in | Wt 164.8 lb

## 2022-01-11 DIAGNOSIS — J449 Chronic obstructive pulmonary disease, unspecified: Secondary | ICD-10-CM | POA: Diagnosis not present

## 2022-01-11 DIAGNOSIS — J9611 Chronic respiratory failure with hypoxia: Secondary | ICD-10-CM | POA: Diagnosis not present

## 2022-01-11 DIAGNOSIS — E785 Hyperlipidemia, unspecified: Secondary | ICD-10-CM | POA: Diagnosis not present

## 2022-01-11 DIAGNOSIS — E441 Mild protein-calorie malnutrition: Secondary | ICD-10-CM | POA: Diagnosis not present

## 2022-01-11 DIAGNOSIS — K5904 Chronic idiopathic constipation: Secondary | ICD-10-CM

## 2022-01-11 DIAGNOSIS — J189 Pneumonia, unspecified organism: Secondary | ICD-10-CM | POA: Diagnosis not present

## 2022-01-11 DIAGNOSIS — F418 Other specified anxiety disorders: Secondary | ICD-10-CM

## 2022-01-11 DIAGNOSIS — I10 Essential (primary) hypertension: Secondary | ICD-10-CM

## 2022-01-11 DIAGNOSIS — E1122 Type 2 diabetes mellitus with diabetic chronic kidney disease: Secondary | ICD-10-CM

## 2022-01-11 DIAGNOSIS — E1169 Type 2 diabetes mellitus with other specified complication: Secondary | ICD-10-CM

## 2022-01-11 DIAGNOSIS — N184 Chronic kidney disease, stage 4 (severe): Secondary | ICD-10-CM

## 2022-01-11 NOTE — Progress Notes (Signed)
Careteam: Patient Care Team: Lauree Chandler, NP as PCP - General (Geriatric Medicine) Zebedee Iba., MD as Referring Physician (Ophthalmology) Chesley Mires, MD as Consulting Physician (Pulmonary Disease) Lavonna Monarch, MD as Consulting Physician (Dermatology) Ladell Pier, MD as Consulting Physician (Oncology)  PLACE OF SERVICE:  [place]  Advanced Directive information    Allergies  Allergen Reactions   Ace Inhibitors Cough    Chief Complaint  Patient presents with   Medical Management of Chronic Issues    Patient presents today for a 4 month follow-up   Quality Metric Gaps    A1C, zoster, TDAP, pneumonia,COVID booster#4, eye exam     HPI: Patient is a 86 y.o. male for outine follow up.   Recently hospitalized with pneumonia and then went to [facility] for rehab. Reports he thought he was going to die and rehab was the worst of it. He is doing better at this time.  Cough has resolved.   Has had weight loss- Reports food was bad in rehab, states he is eating better now but watching his carbohydrates.   Denies anxiety or depression.      Review of Systems:  Review of Systems  Constitutional:  Negative for chills, fever and weight loss.  HENT:  Negative for tinnitus.   Respiratory:  Negative for cough, sputum production and shortness of breath.   Cardiovascular:  Negative for chest pain, palpitations and leg swelling.  Gastrointestinal:  Negative for abdominal pain, constipation, diarrhea and heartburn.  Genitourinary:  Negative for dysuria, frequency and urgency.  Musculoskeletal:  Negative for back pain, falls, joint pain and myalgias.  Skin: Negative.   Neurological:  Negative for dizziness and headaches.  Psychiatric/Behavioral:  Negative for depression and memory loss. The patient does not have insomnia.     Past Medical History:  Diagnosis Date   Allergy    Alzheimer's disease ([place])    Anemia, unspecified    Anxiety    Chronic maxillary  sinusitis    CKD (chronic kidney disease)    3b/4   COPD (chronic obstructive pulmonary disease) (HCC)    Depression    Diabetes mellitus    Edema    Hypercalcemia    Hyperlipidemia    Hypertension    Hypertrophy of prostate without urinary obstruction and other lower urinary tract symptoms (LUTS)    Hypertrophy of prostate without urinary obstruction and other lower urinary tract symptoms (LUTS)    Kidney disease    mild   Unspecified disorder of kidney and ureter    Unspecified sleep apnea    Ventral hernia, unspecified, without mention of obstruction or gangrene    Past Surgical History:  Procedure Laterality Date   EXPLORATORY LAPAROTOMY  2002   HERNIA REPAIR     Social History:   reports that he quit smoking about 33 years ago. His smoking use included cigarettes. He has a 45.00 pack-year smoking history. He has been exposed to tobacco smoke. He has never used smokeless tobacco. He reports that he does not drink alcohol and does not use drugs.  Family History  Problem Relation Age of Onset   Pancreatic cancer Sister    Heart disease Father    Hypertension Brother    Hypertension Sister     Medications: Patient's Medications  New Prescriptions   No medications on file  Previous Medications   ACETAMINOPHEN (TYLENOL) 500 MG TABLET    Take 500 mg by mouth every 8 (eight) hours as needed for moderate pain.  ALBUTEROL (VENTOLIN HFA) 108 (90 BASE) MCG/ACT INHALER    Inhale 2 puffs into the lungs every 6 (six) hours as needed for wheezing or shortness of breath.   AMLODIPINE (NORVASC) 10 MG TABLET    TAKE 1 TABLET BY MOUTH EVERY DAY   ASPIRIN EC (ASPIRIN LOW DOSE) 81 MG TABLET    TAKE 1 TABLET BY MOUTH EVERY DAY --SWALLOW WHOLE   ATORVASTATIN (LIPITOR) 80 MG TABLET    TAKE 1 TABLET BY MOUTH EVERY DAY   BESIVANCE 0.6 % SUSP    Place 1 drop into the right eye 2 (two) times daily.   BISACODYL 5 MG EC TABLET    Take 1 tablet (5 mg total) by mouth daily as needed for moderate  constipation.   BLOOD GLUCOSE METER KIT AND SUPPLIES KIT    Inject 1 each into the skin 2 (two) times daily. Dispense based on patient and insurance preference. Use up to four times daily as directed. (FOR ICD-9 250.00, 250.01).   FLUTICASONE (FLONASE) 50 MCG/ACT NASAL SPRAY    SPRAY 1 SPRAY INTO BOTH NOSTRILS DAILY.   GLUCOSE BLOOD (ONE TOUCH ULTRA TEST) TEST STRIP    Use to test blood sugar twice daily.  DX: E11.9   GUAIFENESIN (MUCINEX) 600 MG 12 HR TABLET    Take 2 tablets (1,200 mg total) by mouth 2 (two) times daily as needed for cough or to loosen phlegm.   HYDRALAZINE (APRESOLINE) 25 MG TABLET    Take 1 tablet (25 mg total) by mouth 3 (three) times daily.   IPRATROPIUM-ALBUTEROL (DUONEB) 0.5-2.5 (3) MG/3ML SOLN    Take 3 mLs by nebulization every 6 (six) hours as needed (wheezing).   LANCET DEVICES (ONE TOUCH DELICA LANCING DEV) MISC    Use to test blood sugar once daily dx: E119   LORATADINE (CLARITIN) 10 MG TABLET    Take 1 tablet (10 mg total) by mouth daily.   METOPROLOL TARTRATE (LOPRESSOR) 50 MG TABLET    TAKE 1 TABLET BY MOUTH TWICE A DAY   MONTELUKAST (SINGULAIR) 10 MG TABLET    TAKE 1 TABLET BY MOUTH EVERYDAY AT BEDTIME   MULTIPLE VITAMIN (MULTIVITAMIN WITH MINERALS) TABS TABLET    Take 1 tablet by mouth daily.   MULTIPLE VITAMINS-MINERALS (PRESERVISION AREDS PO)    Take 1 tablet by mouth 2 (two) times daily.   PAROXETINE (PAXIL) 40 MG TABLET    TAKE 1 TABLET BY MOUTH EVERY DAY   POLYETHYLENE GLYCOL (MIRALAX / GLYCOLAX) 17 G PACKET    Take 17 g by mouth daily.   SENNA-DOCUSATE (SENOKOT-S) 8.6-50 MG TABLET    Take 1 tablet by mouth 2 (two) times daily.   SODIUM CHLORIDE (OCEAN) 0.65 % SOLN NASAL SPRAY    Place 1 spray into both nostrils daily as needed for congestion.   SYMBICORT 160-4.5 MCG/ACT INHALER    Inhale 2 puffs into the lungs 2 (two) times daily.   TRIMETHOPRIM-POLYMYXIN B (POLYTRIM) OPHTHALMIC SOLUTION    Place 1 drop into the right eye every 6 (six) hours. 2 days after  each monthly eye injection.   VITAMIN D, ERGOCALCIFEROL, (DRISDOL) 1.25 MG (50000 UNIT) CAPS CAPSULE    TAKE 1 CAPSULE BY MOUTH ONE TIME PER WEEK  Modified Medications   No medications on file  Discontinued Medications   No medications on file    Physical Exam:  Vitals:   01/11/22 0931  BP: 118/60  Pulse: 83  Resp: 20  Temp: (!) 97.5 F (36.4 C)  SpO2: 90%  Weight: 164 lb 12.8 oz (74.8 kg)  Height: 5' 7"  (1.702 m)   Body mass index is 25.81 kg/m. Wt Readings from Last 3 Encounters:  01/11/22 164 lb 12.8 oz (74.8 kg)  12/26/21 171 lb (77.6 kg)  12/14/21 161 lb 6 oz (73.2 kg)    Physical Exam Constitutional:      General: He is not in acute distress.    Appearance: He is well-developed. He is not diaphoretic.  HENT:     Head: Normocephalic and atraumatic.     Right Ear: External ear normal.     Left Ear: External ear normal.     Mouth/Throat:     Pharynx: No oropharyngeal exudate.  Eyes:     Conjunctiva/sclera: Conjunctivae normal.     Pupils: Pupils are equal, round, and reactive to light.  Cardiovascular:     Rate and Rhythm: Normal rate and regular rhythm.     Heart sounds: Normal heart sounds.  Pulmonary:     Effort: Pulmonary effort is normal.     Breath sounds: Normal breath sounds.  Abdominal:     General: Bowel sounds are normal.     Palpations: Abdomen is soft.  Musculoskeletal:        General: No tenderness.     Cervical back: Normal range of motion and neck supple.     Right lower leg: No edema.     Left lower leg: No edema.  Skin:    General: Skin is warm and dry.  Neurological:     Mental Status: He is alert and oriented to person, place, and time.     Labs reviewed: Basic Metabolic Panel: Recent Labs    12/07/21 2259 12/08/21 0507 12/09/21 0437 12/11/21 0436 12/13/21 0432 12/26/21 1132  NA  --  140   < > 141 140 142  K  --  4.1   < > 4.5 4.3 4.7  CL  --  114*   < > 110 108 111*  CO2  --  20*   < > 23 24 23   GLUCOSE  --  127*   <  > 112* 123* 112  BUN  --  61*   < > 37* 45* 49*  CREATININE  --  3.17*   < > 2.72* 2.78* 3.07*  CALCIUM  --  9.6   < > 10.2 10.1 9.1  MG  --  2.3  --   --   --   --   PHOS  --  3.6  --   --   --   --   TSH 1.670  --   --   --   --   --    < > = values in this interval not displayed.   Liver Function Tests: Recent Labs    12/09/21 0437 12/10/21 0418 12/11/21 0436 12/26/21 1132  AST 20 24 23 17   ALT 19 23 24 18   ALKPHOS 56 55 58  --   BILITOT 0.7 0.7 0.7 0.3  PROT 6.0* 5.9* 6.3* 5.6*  ALBUMIN 2.8* 2.7* 2.9*  --    No results for input(s): "LIPASE", "AMYLASE" in the last 8760 hours. No results for input(s): "AMMONIA" in the last 8760 hours. CBC: Recent Labs    12/07/21 1504 12/08/21 0507 12/09/21 0437 12/10/21 0418 12/11/21 0436 12/26/21 1132  WBC 12.9* 10.9*   < > 8.6 9.7 7.3  NEUTROABS 9.2* 7.4  --   --   --  3,971  HGB  9.9* 10.0*   < > 9.5* 10.6* 9.3*  HCT 31.2* 31.4*   < > 28.8* 31.8* 28.3*  MCV 101.6* 103.6*   < > 101.1* 99.7 99.6  PLT 173 149*   < > 149* 167 190   < > = values in this interval not displayed.   Lipid Panel: No results for input(s): "CHOL", "HDL", "LDLCALC", "TRIG", "CHOLHDL", "LDLDIRECT" in the last 8760 hours. TSH: Recent Labs    12/07/21 2259  TSH 1.670   A1C: Lab Results  Component Value Date   HGBA1C 5.8 (H) 06/25/2021     Assessment/Plan 1. Type 2 diabetes mellitus with stage 4 chronic kidney disease, without long-term current use of insulin (HCC) -Encouraged dietary compliance, routine foot care/monitoring and to keep up with diabetic eye exams through ophthalmology  - Hemoglobin A1c  2. Hyperlipidemia associated with type 2 diabetes mellitus ([place]) -continues on statin with dietary modifications. - Lipid panel  3. Essential hypertension -Blood pressure well controlled Continue current medications follow metabolic panel  4. CKD stage 4 due to type 2 diabetes mellitus (HCC) -Chronic and stable, followed by  nephrology Encourage proper hydration Follow metabolic panel Avoid nephrotoxic meds (NSAIDS)  5. Depression with anxiety Controlled at this time.   6. Mild protein-calorie malnutrition ([place]) -encouraged to liberalize diet. To have protein supplement in addition to smallest meal of the day.  7. COPD with asthma ([place]) -controlled, denies shortness of breath or cough. O2 sats did drop with ambulation to 82, he has O2 at home and recommended that he use to keeps sats over 88-90%  8. Chronic idiopathic constipation -controlled at this time.   9. Chronic respiratory failure with hypoxia ([place]) -has O2 at home but does not use routinely, with drop in sats in office recommended to use routinely   10. Community acquired pneumonia of right lung, unspecified part of lung -symptoms have resolved, doing well. He will follow up chest xray today    Return in about 4 months (around 05/14/2022) for routine follow up . Carlos American. [place], [facility] [phone]

## 2022-01-11 NOTE — Patient Instructions (Signed)
To get your chest xray after appt today at [facility].

## 2022-01-12 LAB — HEMOGLOBIN A1C
Hgb A1c MFr Bld: 5.5 % of total Hgb (ref <5.7)
Mean Plasma Glucose: 111 mg/dL
eAG (mmol/L): 6.2 mmol/L

## 2022-01-12 LAB — LIPID PANEL
Cholesterol: 143 mg/dL (ref <200)
HDL: 43 mg/dL (ref >=40)
LDL Cholesterol (Calc): 77 mg/dL (calc)
Non-HDL Cholesterol (Calc): 100 mg/dL (calc) (ref <130)
Total CHOL/HDL Ratio: 3.3 (calc) (ref <5.0)
Triglycerides: 132 mg/dL (ref <150)

## 2022-01-17 ENCOUNTER — Other Ambulatory Visit: Payer: PPO | Admitting: Family Medicine

## 2022-01-17 ENCOUNTER — Encounter: Payer: Self-pay | Admitting: Family Medicine

## 2022-01-17 VITALS — BP 118/50 | HR 67 | Resp 20

## 2022-01-17 DIAGNOSIS — R634 Abnormal weight loss: Secondary | ICD-10-CM

## 2022-01-17 NOTE — Progress Notes (Signed)
[facility] Pall[facility] Care Consult Note Telephone: [phone]  Fax: [phone]    Date of encounter: 01/17/22 12:19 PM PATIENT NAME: Ian [name] [address] [place] [address]   [phone] (home)  DOB: March 08, 1936 MRN: [id number] PRIMARY CARE PROVIDER:    Lauree Chandler, NP,  [place] [address] [phone]  REFERRING PROVIDER:   Lauree Chandler, NP [place],  [place] [address] [phone]  RESPONSIBLE PARTY:    Contact Information     Name Relation Home Work Mobile   [place], [place]   [phone]   [place] Son   [phone]        I met face to face with patient and son in his home. Palliative Care was asked to follow this patient by consultation request of  Lauree Chandler, NP to address advance care planning and complex medical decision making. This is a follow up visit.  ASSESSMENT,SYMPTOM MANAGEMENT  AND PLAN / RECOMMENDATIONS:   Weight loss Advised on use of protein supplement in smoothies, use of items like muscle milk in cereal to increase protein intake. Encouraged intake of nuts, beans with low sodium and cheese to increase calories and protein content.   Advance Care Planning/Goals of Care: Goals include to maximize quality of life and symptom management.    Decision not to resuscitate or to de-escalate disease focused treatments due to poor prognosis. CODE STATUS: Prior code status listed as DNR/DNI and comfort measures (not seen on file)     Follow up Palliative Care Visit: Palliative care will continue to follow for complex medical decision making, advance care planning, and clarification of goals. Return 4-5 weeks or prn.   This visit was coded based on medical decision making (MDM).  PPS: 70%  HOSPICE ELIGIBILITY/DIAGNOSIS: TBD  Chief Complaint:  Palliative Care is following up for chronic medical management in setting of  dementia.  HISTORY OF PRESENT ILLNESS:  Rorik Vespa is a 86 y.o. year old male with Alzheimer's Dementia, anemia, CKD, COPD, Depression, DM, hypercalcemia, HLD, HTN and BPH. He uses MOM,Senna or ex-lax when having difficulty with constipation. FSBS 120s-150s. Per son, pt forgot about today's appointment and was on his way to the [facility] to work out when son called to remind him.  He denies CP, SOB, falls, nausea, vomiting, difficulty urinating, has some intermittent constipation. Son states pt eats out a lot but has good appetite.  Denies difficulty swallowing or coughing/choking after eating or drinking.  He is independent with his ADLs.  He was in the hospital briefly in early June 2023 with CAP and went for a brief period to rehab to improve strength.  Now he is back at home.   On 01/11/22: Lipid profile was at goal except for mildly low HDL 43. HGB A1c was 5.5%  On 12/26/21: BMP remarkable for increased CL 111, CR 3.07 and BUN 49 and low eGFR 19. Alkaline Phosphatase normal at 66 CBC remarkable for low RBC 2.84, HGB 9.3, HCT 28.3%. Albumin low at 3.2.  History obtained from review of EMR, discussion with family and/or Mr. Rodwell.  I reviewed available labs, medications, imaging, studies and related documents from the EMR.  Records reviewed and summarized above.   ROS General: NAD ENMT: denies dysphagia Cardiovascular: denies chest pain, denies DOE Pulmonary: denies cough, denies increased SOB Abdomen: endorses good appetite, endorses occasional constipation, endorses continence of bowel GU: denies dysuria, endorses continence of urine MSK:  denies increased weakness, no  falls reported Neurological: denies pain, denies insomnia Psych: Endorses positive mood Heme/lymph/immuno: denies bruises, abnormal bleeding  Physical Exam: Current and past weights:    He has recently lost weight from 171 on 12/26/21 to 164 lbs 12.8 ounces on 01/11/22.  Constitutional: NAD General: WN, WD ENMT: intact  hearing, oral mucous membranes moist, dentition intact CV: S1S2, RRR, no LE edema Pulmonary: CTAB , no increased work of breathing, no cough, room air Abdomen: normo-active BS + 4 quadrants, soft and non tender, MSK: no sarcopenia, moves all extremities, ambulatory Skin: warm and dry, no rashes or wounds on visible skin Neuro:  no generalized weakness,  noted short term memory deficit Psych: non-anxious affect, A and O x 3    Thank you for the opportunity to participate in the care of Mr. Hommes.  The palliative care team will continue to follow. Please call our office at [phone] if we can be of additional assistance.   Marijo Conception, FNP -C  COVID-19 PATIENT SCREENING TOOL Asked and negative response unless otherwise noted:   Have you had symptoms of covid, tested positive or been in contact with someone with symptoms/positive test in the past 5-10 days?  NO

## 2022-01-21 ENCOUNTER — Encounter: Payer: Self-pay | Admitting: Family Medicine

## 2022-02-04 ENCOUNTER — Ambulatory Visit (INDEPENDENT_AMBULATORY_CARE_PROVIDER_SITE_OTHER): Payer: PPO | Admitting: Adult Health

## 2022-02-04 ENCOUNTER — Ambulatory Visit
Admission: RE | Admit: 2022-02-04 | Discharge: 2022-02-04 | Disposition: A | Discharge status: Home / Self Care | Payer: PPO | Source: Ambulatory Visit | Attending: Adult Health | Admitting: Adult Health

## 2022-02-04 ENCOUNTER — Encounter: Payer: Self-pay | Admitting: Adult Health

## 2022-02-04 VITALS — BP 110/58 | HR 70 | Temp 97.3°F | Resp 16 | Ht 67.0 in | Wt 165.0 lb

## 2022-02-04 DIAGNOSIS — J449 Chronic obstructive pulmonary disease, unspecified: Secondary | ICD-10-CM

## 2022-02-04 DIAGNOSIS — R410 Disorientation, unspecified: Secondary | ICD-10-CM | POA: Diagnosis not present

## 2022-02-04 DIAGNOSIS — E119 Type 2 diabetes mellitus without complications: Secondary | ICD-10-CM | POA: Diagnosis not present

## 2022-02-04 DIAGNOSIS — J189 Pneumonia, unspecified organism: Secondary | ICD-10-CM

## 2022-02-04 DIAGNOSIS — J90 Pleural effusion, not elsewhere classified: Secondary | ICD-10-CM | POA: Diagnosis not present

## 2022-02-04 DIAGNOSIS — R600 Localized edema: Secondary | ICD-10-CM | POA: Diagnosis not present

## 2022-02-04 DIAGNOSIS — J9601 Acute respiratory failure with hypoxia: Secondary | ICD-10-CM | POA: Diagnosis not present

## 2022-02-04 DIAGNOSIS — N1831 Chronic kidney disease, stage 3a: Secondary | ICD-10-CM | POA: Diagnosis not present

## 2022-02-04 DIAGNOSIS — I10 Essential (primary) hypertension: Secondary | ICD-10-CM

## 2022-02-04 LAB — POCT URINALYSIS DIPSTICK
Bilirubin, UA: NEGATIVE
Blood, UA: NEGATIVE
Glucose, UA: NEGATIVE
Ketones, UA: NEGATIVE
Nitrite, UA: NEGATIVE
Odor: NORMAL
Protein, UA: POSITIVE — AB
Spec Grav, UA: 1.025 (ref 1.010–1.025)
Urobilinogen, UA: 0.2 E.U./dL
pH, UA: 5 (ref 5.0–8.0)

## 2022-02-04 MED ORDER — DOXYCYCLINE HYCLATE 100 MG PO TABS: 100.0000 mg | ORAL_TABLET | Freq: Two times a day (BID) | ORAL | 0 refills | Status: AC

## 2022-02-04 MED ORDER — HYDRALAZINE HCL 25 MG PO TABS: 25.0000 mg | ORAL_TABLET | Freq: Two times a day (BID) | ORAL | 3 refills | Status: DC

## 2022-02-04 NOTE — Progress Notes (Unsigned)
[facility]  Provider:   Durenda Age DNP  Code Status:   Goals of Care:     12/26/2021    8:24 AM  Advanced Directives  Does Patient Have a Medical Advance Directive? Yes  Type of Advance Directive Living will;Healthcare Power of Attorney  Does patient want to make changes to medical advance directive? No - Patient declined     Chief Complaint  Patient presents with   Acute Visit    Patient is here for low oxygen and swelling of lower extremitites    HPI: Patient is a 86 y.o. male seen today for an acute visit for  Past Medical History:  Diagnosis Date   Allergy    Alzheimer's disease ([place])    Anemia, unspecified    Anxiety    Chronic maxillary sinusitis    CKD (chronic kidney disease)    3b/4   COPD (chronic obstructive pulmonary disease) ([place])    Depression    Diabetes mellitus    Edema    Hypercalcemia    Hyperlipidemia    Hypertension    Hypertrophy of prostate without urinary obstruction and other lower urinary tract symptoms (LUTS)    Hypertrophy of prostate without urinary obstruction and other lower urinary tract symptoms (LUTS)    Kidney disease    mild   Unspecified disorder of kidney and ureter    Unspecified sleep apnea    Ventral hernia, unspecified, without mention of obstruction or gangrene     Past Surgical History:  Procedure Laterality Date   EXPLORATORY LAPAROTOMY  2002   HERNIA REPAIR      Allergies  Allergen Reactions   Ace Inhibitors Cough    Outpatient Encounter Medications as of 02/04/2022  Medication Sig   acetaminophen (TYLENOL) 500 MG tablet Take 500 mg by mouth every 8 (eight) hours as needed for moderate pain.   albuterol (VENTOLIN HFA) 108 (90 Base) MCG/ACT inhaler Inhale 2 puffs into the lungs every 6 (six) hours as needed for wheezing or shortness of breath.   amLODipine (NORVASC) 10 MG tablet TAKE 1 TABLET BY MOUTH EVERY DAY   aspirin EC (ASPIRIN LOW DOSE) 81 MG tablet TAKE 1 TABLET BY MOUTH EVERY DAY  --SWALLOW WHOLE   atorvastatin (LIPITOR) 80 MG tablet TAKE 1 TABLET BY MOUTH EVERY DAY   BESIVANCE 0.6 % SUSP Place 1 drop into the right eye 2 (two) times daily.   bisacodyl 5 MG EC tablet Take 1 tablet (5 mg total) by mouth daily as needed for moderate constipation.   blood glucose meter kit and supplies KIT Inject 1 each into the skin 2 (two) times daily. Dispense based on patient and insurance preference. Use up to four times daily as directed. (FOR ICD-9 250.00, 250.01).   fluticasone (FLONASE) 50 MCG/ACT nasal spray SPRAY 1 SPRAY INTO BOTH NOSTRILS DAILY.   glucose blood (ONE TOUCH ULTRA TEST) test strip Use to test blood sugar twice daily.  DX: E11.9   guaiFENesin (MUCINEX) 600 MG 12 hr tablet Take 2 tablets (1,200 mg total) by mouth 2 (two) times daily as needed for cough or to loosen phlegm.   hydrALAZINE (APRESOLINE) 25 MG tablet Take 1 tablet (25 mg total) by mouth 3 (three) times daily.   ipratropium-albuterol (DUONEB) 0.5-2.5 (3) MG/3ML SOLN Take 3 mLs by nebulization every 6 (six) hours as needed (wheezing).   Lancet Devices (ONE TOUCH DELICA LANCING DEV) MISC Use to test blood sugar once daily dx: E119   loratadine (CLARITIN) 10 MG  tablet Take 1 tablet (10 mg total) by mouth daily.   metoprolol tartrate (LOPRESSOR) 50 MG tablet TAKE 1 TABLET BY MOUTH TWICE A DAY   montelukast (SINGULAIR) 10 MG tablet TAKE 1 TABLET BY MOUTH EVERYDAY AT BEDTIME   Multiple Vitamin (MULTIVITAMIN WITH MINERALS) TABS tablet Take 1 tablet by mouth daily.   Multiple Vitamins-Minerals (PRESERVISION AREDS PO) Take 1 tablet by mouth 2 (two) times daily.   PARoxetine (PAXIL) 40 MG tablet TAKE 1 TABLET BY MOUTH EVERY DAY   polyethylene glycol (MIRALAX / GLYCOLAX) 17 g packet Take 17 g by mouth daily.   senna-docusate (SENOKOT-S) 8.6-50 MG tablet Take 1 tablet by mouth 2 (two) times daily.   sodium chloride (OCEAN) 0.65 % SOLN nasal spray Place 1 spray into both nostrils daily as needed for congestion.    SYMBICORT 160-4.5 MCG/ACT inhaler Inhale 2 puffs into the lungs 2 (two) times daily.   trimethoprim-polymyxin b (POLYTRIM) ophthalmic solution Place 1 drop into the right eye every 6 (six) hours. 2 days after each monthly eye injection.   Vitamin D, Ergocalciferol, (DRISDOL) 1.25 MG (50000 UNIT) CAPS capsule TAKE 1 CAPSULE BY MOUTH ONE TIME PER WEEK   No facility-administered encounter medications on file as of 02/04/2022.    Review of Systems:  Review of Systems  Constitutional:  Positive for appetite change. Negative for activity change and fever.       Poor appetite  HENT:  Negative for sore throat.   Eyes: Negative.   Respiratory:  Positive for shortness of breath. Negative for cough.   Cardiovascular:  Negative for chest pain and leg swelling.  Gastrointestinal:  Negative for abdominal distention, diarrhea and vomiting.  Genitourinary:  Negative for dysuria, frequency and urgency.  Skin:  Negative for color change.  Neurological:  Negative for dizziness and headaches.  Psychiatric/Behavioral:  Positive for confusion. Negative for behavioral problems and sleep disturbance. The patient is not nervous/anxious.     Health Maintenance  Topic Date Due   Zoster Vaccines- Shingrix (1 of 2) Never done   Pneumonia Vaccine 98+ Years old (2 - PPSV23 or PCV20) 08/01/2014   COVID-19 Vaccine (4 - Booster for Moderna series) 07/05/2020   TETANUS/TDAP  08/11/2021   OPHTHALMOLOGY EXAM  10/04/2021   INFLUENZA VACCINE  02/05/2022   FOOT EXAM  06/25/2022   HEMOGLOBIN A1C  07/14/2022   HPV VACCINES  Aged Out    Physical Exam: There were no vitals filed for this visit. There is no height or weight on file to calculate BMI. Physical Exam Constitutional:      Appearance: Normal appearance.  HENT:     Head: Normocephalic and atraumatic.     Mouth/Throat:     Mouth: Mucous membranes are moist.  Eyes:     Conjunctiva/sclera: Conjunctivae normal.  Cardiovascular:     Rate and Rhythm: Normal  rate and regular rhythm.     Pulses: Normal pulses.     Heart sounds: Normal heart sounds.  Pulmonary:     Effort: Pulmonary effort is normal.     Breath sounds: Normal breath sounds.  Abdominal:     General: Bowel sounds are normal.     Palpations: Abdomen is soft.  Musculoskeletal:        General: No swelling. Normal range of motion.     Cervical back: Normal range of motion.  Skin:    General: Skin is warm and dry.  Neurological:     General: No focal deficit present.  Mental Status: He is alert and oriented to person, place, and time.  Psychiatric:        Mood and Affect: Mood normal.        Behavior: Behavior normal.        Thought Content: Thought content normal.        Judgment: Judgment normal.     Labs reviewed: Basic Metabolic Panel: Recent Labs    12/07/21 2259 12/08/21 0507 12/09/21 0437 12/11/21 0436 12/13/21 0432 12/26/21 1132  NA  --  140   < > 141 140 142  K  --  4.1   < > 4.5 4.3 4.7  CL  --  114*   < > 110 108 111*  CO2  --  20*   < > 23 24 23   GLUCOSE  --  127*   < > 112* 123* 112  BUN  --  61*   < > 37* 45* 49*  CREATININE  --  3.17*   < > 2.72* 2.78* 3.07*  CALCIUM  --  9.6   < > 10.2 10.1 9.1  MG  --  2.3  --   --   --   --   PHOS  --  3.6  --   --   --   --   TSH 1.670  --   --   --   --   --    < > = values in this interval not displayed.   Liver Function Tests: Recent Labs    12/09/21 0437 12/10/21 0418 12/11/21 0436 12/26/21 1132  AST 20 24 23 17   ALT 19 23 24 18   ALKPHOS 56 55 58  --   BILITOT 0.7 0.7 0.7 0.3  PROT 6.0* 5.9* 6.3* 5.6*  ALBUMIN 2.8* 2.7* 2.9*  --    No results for input(s): "LIPASE", "AMYLASE" in the last 8760 hours. No results for input(s): "AMMONIA" in the last 8760 hours. CBC: Recent Labs    12/07/21 1504 12/08/21 0507 12/09/21 0437 12/10/21 0418 12/11/21 0436 12/26/21 1132  WBC 12.9* 10.9*   < > 8.6 9.7 7.3  NEUTROABS 9.2* 7.4  --   --   --  3,971  HGB 9.9* 10.0*   < > 9.5* 10.6* 9.3*  HCT  31.2* 31.4*   < > 28.8* 31.8* 28.3*  MCV 101.6* 103.6*   < > 101.1* 99.7 99.6  PLT 173 149*   < > 149* 167 190   < > = values in this interval not displayed.   Lipid Panel: Recent Labs    01/11/22 1002  CHOL 143  HDL 43  LDLCALC 77  TRIG 132  CHOLHDL 3.3   Lab Results  Component Value Date   HGBA1C 5.5 01/11/2022    Procedures since last visit: No results found.  Assessment/Plan    Labs/tests ordered:  * No order type specified * Next appt:  05/17/2022

## 2022-02-04 NOTE — Progress Notes (Signed)
Chest x-ray showed Cardiomegaly with new small bilateral pleural effusions and probable bibasilar atelectasis. Vascular congestion without overt pulmonary edema. Sent prescription to [facility] for Doxycycline X 10 days

## 2022-02-04 NOTE — Patient Instructions (Signed)
Hypotension As your heart beats, it forces blood through your body. This force is called blood pressure. If you have hypotension, you have low blood pressure.  When your blood pressure is too low, you may not get enough blood to your brain or other parts of your body. This may cause you to feel weak, light-headed, have a fast heartbeat, or even faint. Low blood pressure may be harmless, or it may cause serious problems. What are the causes? Blood loss. Not enough water in the body (dehydration). Heart problems. Hormone problems. Pregnancy. A very bad infection. Not having enough of certain nutrients. Very bad allergic reactions. Certain medicines. What increases the risk? Age. The risk increases as you get older. Conditions that affect the heart or the brain and spinal cord (central nervous system). What are the signs or symptoms? Feeling: Weak. Light-headed. Dizzy. Tired (fatigued). Blurred vision. Fast heartbeat. Fainting, in very bad cases. How is this treated? Changing your diet. This may involve drinking more water or including more salt (sodium) in your diet by eating high-salt foods. Taking medicines to raise your blood pressure. Changing how much you take (the dosage) of some of your medicines. Wearing compression stockings. These stockings help to prevent blood clots and reduce swelling in your legs. In some cases, you may need to go to the hospital to: Receive fluids through an IV tube. Receive donated blood through an IV tube (transfusion). Get treated for an infection or heart problems, if this applies. Be monitored while medicines that you are taking wear off. Follow these instructions at home: Eating and drinking  Drink enough fluids to keep your pee (urine) pale yellow. Eat a healthy diet. Follow instructions from your doctor about what you can eat or drink. A healthy diet includes: Fresh fruits and vegetables. Whole grains. Low-fat (lean) meats. Low-fat  dairy products. If told, include more salt in your diet. Do not add extra salt to your diet unless your doctor tells you to. Eat small meals often. Avoid standing up quickly after you eat. Medicines Take over-the-counter and prescription medicines only as told by your doctor. Follow instructions from your doctor about changing how much you take of your medicines, if this applies. Do not stop or change any of your medicines on your own. General instructions  Wear compression stockings as told by your doctor. Get up slowly from lying down or sitting. Avoid hot showers and a lot of heat as told by your doctor. Return to your normal activities when your doctor says that it is safe. Do not smoke or use any products that contain nicotine or tobacco. If you need help quitting, ask your doctor. Keep all follow-up visits. Contact a doctor if: You vomit. You have watery poop (diarrhea). You have a fever for more than 2-3 days. You feel more thirsty than normal. You feel weak and tired. Get help right away if: You have chest pain. You have a fast or uneven heartbeat. You lose feeling (have numbness) in any part of your body. You cannot move your arms or your legs. You have trouble talking. You get sweaty or feel light-headed. You faint. You have trouble breathing. You have trouble staying awake. You feel mixed up (confused). These symptoms may be an emergency. Get help right away. Call 911. Do not wait to see if the symptoms will go away. Do not drive yourself to the hospital. Summary Hypotension is also called low blood pressure. It is when the force of blood pumping through your body   is too weak. Hypotension may be harmless, or it may cause serious problems. Treatment may include changing your diet and medicines, and wearing compression stockings. In very bad cases, you may need to go to the hospital. This information is not intended to replace advice given to you by your health care  provider. Make sure you discuss any questions you have with your health care provider. Document Revised: 02/12/2021 Document Reviewed: 02/12/2021 Elsevier Patient Education  2023 Elsevier Inc.  

## 2022-02-05 ENCOUNTER — Telehealth: Payer: PPO

## 2022-02-05 ENCOUNTER — Telehealth: Payer: Self-pay | Admitting: Family Medicine

## 2022-02-05 NOTE — Telephone Encounter (Signed)
Detailed message left on Ian [name]'s voicemail with Ian [name]'s response. Ian [name] as instructed to call the office to schedule an appointment

## 2022-02-05 NOTE — Progress Notes (Signed)
Chest x-ray result is indicative of pneumonia so I have ordered Doxycycline 100 mg by mouth twice a day X 10 days.

## 2022-02-05 NOTE — Telephone Encounter (Signed)
Received vm from [name] that his father's O2 equipment was not working and recharging so patient was unable to go anywhere or do anything.  TCT [name] and left a message that pt needs to contact the DME vendor who provides the equipment to have them come and either fix it or exchange the equipment.  Advised that equipment most likely has a sticker on it with the phone number to call for the agency that provided it.  Advised most often it was [place], [place] or [employer]. Provided call back number for questions.  Damaris Hippo FNP-C

## 2022-02-05 NOTE — Telephone Encounter (Signed)
In what regards? We may been to set up a visit with the patient, son and myself (can be virtual) if there is a lot of confusion on his diet.

## 2022-02-05 NOTE — Telephone Encounter (Signed)
Patient's son Catalina Antigua would like to know if changing patient's diet will help with his current health. He would also like to know if it will woud you be able to talk to the patient because patient doesn't listen to him If diet doesn't matter then why not let him eat what he wants to.  Message routed to Sherrie Mustache, NP

## 2022-02-06 ENCOUNTER — Telehealth: Payer: Self-pay

## 2022-02-06 LAB — URINE CULTURE
MICRO NUMBER:: 13716801
SPECIMEN QUALITY:: ADEQUATE

## 2022-02-06 NOTE — Progress Notes (Signed)
Urine culture is not indicative of needing antibiotics.

## 2022-02-06 NOTE — Telephone Encounter (Signed)
Patients son called requesting that patients PCP complete a FL2 form, as they are highly considering placement (possibly [facility]). It is a heavy burden to provide his father with the level of supervision and care needed to maintain his safety and monitor his health concerns.   FL2 form filled out and placed in [place], Carlos American, NP for further review and completion. Once complete, [name] would like to be notified to pick up.  Envelope was attached for front desk placement and pick-up.  Clinical intake assistant to call and inform when complete

## 2022-02-07 ENCOUNTER — Telehealth: Payer: Self-pay

## 2022-02-07 NOTE — Telephone Encounter (Signed)
Patient's son [name] called wnating to know about FL2 form. He stated that they have decided to go with [facility]. He would like paperwork faxed to [facility] and would also like to pick up copy of paperwork. He states that he had been speaking with Jonny Ruiz, [place] (Team Lead) regarding this.  Message routed to [employer], [place] (team Lead)  [name] [phone]

## 2022-02-08 ENCOUNTER — Other Ambulatory Visit: Payer: Self-pay

## 2022-02-08 ENCOUNTER — Other Ambulatory Visit: Payer: PPO

## 2022-02-08 ENCOUNTER — Other Ambulatory Visit: Payer: Self-pay | Admitting: *Deleted

## 2022-02-08 DIAGNOSIS — Z111 Encounter for screening for respiratory tuberculosis: Secondary | ICD-10-CM | POA: Diagnosis not present

## 2022-02-08 NOTE — Telephone Encounter (Signed)
Ian [name], son called and stated that he has an appointment with the facility this afternoon and wonders if he could come by the office and pick up the [facility] form to take to the appointment.   Wondering if completed.   Please Advise.

## 2022-02-08 NOTE — Telephone Encounter (Signed)
FL2 form completed and signed.  Son Notified ready for pick up.  Left up front in drawer Copy sent to scanning.

## 2022-02-10 DIAGNOSIS — M5441 Lumbago with sciatica, right side: Secondary | ICD-10-CM | POA: Diagnosis not present

## 2022-02-12 ENCOUNTER — Telehealth: Payer: Self-pay

## 2022-02-12 ENCOUNTER — Telehealth: Payer: Self-pay | Admitting: *Deleted

## 2022-02-12 LAB — QUANTIFERON-TB GOLD PLUS
Mitogen-NIL: >10 IU/mL
NIL: 0.07 IU/mL
QuantiFERON-TB Gold Plus: NEGATIVE
TB1-NIL: <0 IU/mL
TB2-NIL: <0 IU/mL

## 2022-02-12 NOTE — Telephone Encounter (Signed)
Received fax from [place] with [facility] [phone] requesting Oxygen Orders.   Patient is being admitted to their facility.  To be faxed back to Fax: [phone]  Please Advise.

## 2022-02-12 NOTE — Telephone Encounter (Signed)
Called and left message for Margarita Grizzle regarding liters of oxygen. Left verbal on voicemail.

## 2022-02-12 NOTE — Telephone Encounter (Signed)
Per monina's last note 1. Acute respiratory failure with hypoxia (HCC) -  increase O2 to 3L/min via [place]

## 2022-02-12 NOTE — Telephone Encounter (Signed)
Order faxed to [facility]

## 2022-02-12 NOTE — Telephone Encounter (Signed)
Ian [name] with [facility] called stating that patient is on oxygen,but there isn't an order for how many liters is needs to be on.  [facility] [phone]  [place]-[phone]  Message routed to Sherrie Mustache, NP

## 2022-02-13 ENCOUNTER — Telehealth: Payer: Self-pay

## 2022-02-13 NOTE — Telephone Encounter (Signed)
Patients son stopped by to question why  1.) Trazodone is was not on FL2 and I informed him that patient was instructed to stop medication during hospital stay from 12/07/21-12/14/21  See notes copied and pasted from ER encounter:   #) Acute metabolic encephalopathy: Patient's son is noted the patient to exhibit evidence of 1 to 2 days of confusion and somnolence relative to the patient's baseline mental status which she carries underlying diagnosis of Alzheimer's dementia. Patient's acute encephalopathy appears to be on the basis of physiologic stressors stemming from presenting severe sepsis d/t suspected persistent pneumonia, as further detailed above, with potential additional metabolic contributions from acute kidney injury superimposed on stage IV CKD.  Potential mild pharmacologic factors on a secondary basis in the setting of multiple central acting medications as an outpatient, including Paxil and scheduled trazodone.  Patient has continued to take as his son was unaware that he was suppose to stop  Please advise  2.) Also patient not using nebulizer treatment (duo-neb) since a week of being released from the hospital, order was only temporary after an acute pneumonia diagnosis (per son).   [name] left a form from [employer] [facility] [place] to fill out to d/c nebulizer order. Form placed on Jessica's desk in review and sign folder.   Call [name] to pick up once completed and he will hand deliver to [facility]

## 2022-02-13 NOTE — Telephone Encounter (Signed)
Order was faxed on yesterday to [facility] at fax number [phone]

## 2022-02-13 NOTE — Telephone Encounter (Signed)
[place] [facility] has been working with this, did we get the order sent in?

## 2022-02-14 NOTE — Telephone Encounter (Signed)
Yes lets have him stop trazodone, since he is at facility is he actually currently taking at this time?

## 2022-02-15 DIAGNOSIS — N189 Chronic kidney disease, unspecified: Secondary | ICD-10-CM | POA: Diagnosis not present

## 2022-02-15 DIAGNOSIS — J449 Chronic obstructive pulmonary disease, unspecified: Secondary | ICD-10-CM | POA: Diagnosis not present

## 2022-02-15 DIAGNOSIS — I509 Heart failure, unspecified: Secondary | ICD-10-CM | POA: Diagnosis not present

## 2022-02-15 DIAGNOSIS — I130 Hypertensive heart and chronic kidney disease with heart failure and stage 1 through stage 4 chronic kidney disease, or unspecified chronic kidney disease: Secondary | ICD-10-CM | POA: Diagnosis not present

## 2022-02-15 DIAGNOSIS — E1122 Type 2 diabetes mellitus with diabetic chronic kidney disease: Secondary | ICD-10-CM | POA: Diagnosis not present

## 2022-02-15 DIAGNOSIS — E785 Hyperlipidemia, unspecified: Secondary | ICD-10-CM | POA: Diagnosis not present

## 2022-02-15 NOTE — Telephone Encounter (Signed)
Patient son, [name] Notified and agreed.   Also, Janett Billow signed orders to D/C DuoNeb. Faxed order to [place] Fax: [phone]

## 2022-02-18 ENCOUNTER — Telehealth: Payer: Self-pay | Admitting: Family Medicine

## 2022-02-18 DIAGNOSIS — H5211 Myopia, right eye: Secondary | ICD-10-CM | POA: Diagnosis not present

## 2022-02-18 DIAGNOSIS — H25812 Combined forms of age-related cataract, left eye: Secondary | ICD-10-CM | POA: Diagnosis not present

## 2022-02-18 DIAGNOSIS — Z961 Presence of intraocular lens: Secondary | ICD-10-CM | POA: Diagnosis not present

## 2022-02-18 DIAGNOSIS — H04123 Dry eye syndrome of bilateral lacrimal glands: Secondary | ICD-10-CM | POA: Diagnosis not present

## 2022-02-19 NOTE — Telephone Encounter (Signed)
02/18/22 10:45 am Spoke with son who had called in and left a message that pt was having increased respiratory difficulty and requiring O2 with Pneumonia.  Son states pt has moved to [facility]. Damaris Hippo FNP-C

## 2022-02-20 ENCOUNTER — Encounter (INDEPENDENT_AMBULATORY_CARE_PROVIDER_SITE_OTHER): Payer: PPO | Admitting: Ophthalmology

## 2022-02-27 ENCOUNTER — Other Ambulatory Visit: Payer: PPO | Admitting: Family Medicine

## 2022-02-27 ENCOUNTER — Encounter: Payer: Self-pay | Admitting: Family Medicine

## 2022-02-27 VITALS — BP 136/68 | HR 58 | Resp 18

## 2022-02-27 DIAGNOSIS — J90 Pleural effusion, not elsewhere classified: Secondary | ICD-10-CM

## 2022-02-27 DIAGNOSIS — I5032 Chronic diastolic (congestive) heart failure: Secondary | ICD-10-CM | POA: Diagnosis not present

## 2022-02-27 DIAGNOSIS — N1831 Chronic kidney disease, stage 3a: Secondary | ICD-10-CM

## 2022-02-27 NOTE — Progress Notes (Signed)
[facility] Palliative Care Consult Note Telephone: [phone]  Fax: [phone]    Date of encounter: 02/27/22 7:39 PM PATIENT NAME: Ian [name] [address] [place] [address]   [phone] (home)  DOB: [birth year] MRN: [id number] PRIMARY CARE PROVIDER:    Lauree Chandler, NP,  [place] [address] [phone]  REFERRING PROVIDER:   Lauree Chandler, NP [place],  [place] [address] [phone]  RESPONSIBLE PARTY:    Contact Information     Name Relation Home Work Mobile   Ian, [name]   [phone]   [place] Pandora Leiter   [phone]        I met face to face with patient who moved from home to an assisted living facility. Palliative Care was asked to follow this patient by consultation request of  Ian Chandler, NP to address advance care planning and complex medical decision making. This is a follow up visit.  ASSESSMENT, SYMPTOM MANAGEMENT AND PLAN / RECOMMENDATIONS:   1.  Chronic diastolic heart failure/Bilateral pleural effusion Recommend CMP to assess albumin and BNP to re-assess heart failure/pleural effusions Recommend daily weights Repeat CXR Continue BB   2.  CKD stage IV Repeat CMP for renal function to determine if improving and able to add diuretic therapy even intermittently.   Advance Care Planning/Goals of Care: Goals include to maximize quality of life and symptom management.  Our advance care planning conversation included a discussion about:    Identification of a healthcare agent -Ian [name], son Decision not to resuscitate or to de-escalate disease focused treatments due to poor prognosis. CODE STATUS: DNR/DNI per PCP records    Follow up Palliative Care Visit: Palliative care will continue to follow for complex medical decision making, advance care planning, and clarification of goals. Return 4 weeks or prn.   This visit was coded based on medical decision  making (MDM).  PPS: 50%  HOSPICE ELIGIBILITY/DIAGNOSIS: TBD  Chief Complaint Palliative Care is following for chronic medical management of COPD and heart failure with move from home to assisted living.  Palliative is also following to assist with long term planning and goals of care.   HISTORY OF PRESENT ILLNESS:  Ian [name] is a 86 y.o. year old male with dementia, chronic diastolic heart failure, COPD and chronic respiratory failure, HTN, recent acute on CKD stage 4, allergic rhinitis, DM, HLD, chronic right sided low back pain.  On arrival pt  is noted to be sleeping in his room and not wearing his hearing aids and was awakened by provider shaking his knee.  Pt thought this Ian [name] was someone he knew from church.  States he sometimes feels better laying down, denies orthopnea and PND. Has swelling of both legs and states he is still urinating about "3 times per night."   He put in his hearing aid in right ear and had difficulty with placement, put the left ear in and still had difficulty hearing. Denies falls, pain, nausea or vomiting. Pt had SIRS with CAP in June 2023.  Follow up for acute respiratory failure was done 02/04/22 which saw pt having to wear his O2 to maintain sats above 92% all the time which he previously had not done.  He had been independent and son had found that he was having difficulty taking his meds, he was sleeping more and weaker so he was unable to exercise where prior to this he had been going to the gym and driving  himself.  He states his appetite is beginning to improve. During that visit his PCP d/c'd his Amlodipine and decreased his Hydralazine from 25 mg TID to BID.  O2 sat in the office was 85% with improvement on 2.5 L to 91% and on 3L to 94% and he was demonstrating significant confusion.  CXR done indicative of CAP and he was started on Doxycycline 100 mg BID x 10 days. Urine culture was 50-100K Enterococcus faecalis with PCP indicating no need for further  antibiotics. He had noted 1+ BLE edema and was instructed to elevate legs at night and during the day when sitting.   History obtained from review of EMR, interview with Ian [name].   Quantiferon TB Gold Plus negative on 02/08/22 CXR 02/04/22:  FINDINGS: Stable cardiomegaly and chronic vascular congestion. There are new small bilateral pleural effusions with associated patchy bibasilar pulmonary opacities, likely atelectasis. No confluent airspace opacity or pneumothorax. The bones appear unchanged. Evidence of chronic rotator cuff tears bilaterally.   IMPRESSION: Cardiomegaly with new small bilateral pleural effusions and probable bibasilar atelectasis. Vascular congestion without overt pulmonary edema.   I reviewed available labs, medications, imaging, studies and related documents from the EMR.  Records reviewed and summarized above.   ROS General: NAD Cardiovascular: denies chest pain, denies DOE, PND and orthopnea Pulmonary: denies cough, denies increased SOB Abdomen: endorses improving appetite, denies constipation, endorses continence of bowel GU: denies dysuria, endorses continence of urine MSK:  denies increased weakness,  no falls reported Skin: denies rashes or wounds Neurological: denies pain, denies insomnia Psych: Endorses positive mood   Physical Exam: Current and past weights: Last weight on 02/04/22 was 165 lbs Constitutional: NAD General: frail appearing, thin ENMT: hard of hearing even with hearing aids, oral mucous membranes dry but mouth breathing CV: S1S2, slow regular rate, 1-2+ BLE edema Pulmonary: CTAB in upper lobes, diminished in BLL and RUL, no increased work of breathing, no cough, on 2.5 L [place] Abdomen: normo-active BS + 4 quadrants, soft and non tender GU: deferred MSK: no sarcopenia, moves all extremities, ambulatory Skin: warm and dry Neuro:  sleeping on arrival, arouses to touch on knee, no generalized weakness,  noted confusion Psych: non-anxious  affect, Arousable and O x 2 Hem/lymph/immuno: has some widespread bruising to bilat forearms   Thank you for the opportunity to participate in the care of Ian [name].  The palliative care team will continue to follow. Please call our office at [phone] if we can be of additional assistance.   Marijo Conception, FNP -C  COVID-19 PATIENT SCREENING TOOL Asked and negative response unless otherwise noted:   Have you had symptoms of covid, tested positive or been in contact with someone with symptoms/positive test in the past 5-10 days?  no

## 2022-03-01 ENCOUNTER — Telehealth: Payer: Self-pay | Admitting: Family Medicine

## 2022-03-01 NOTE — Telephone Encounter (Signed)
-   Left vm on pt's mobile phone that I believe pt is needing follow up labs and possible CXR and that he should be hearing from Sherrie Mustache, NP at the office to arrange follow up.  Left my number for call back.  Damaris Hippo FNP-C

## 2022-03-07 ENCOUNTER — Telehealth: Payer: Self-pay

## 2022-03-07 NOTE — Telephone Encounter (Signed)
I called and left message informing [name] that if he has access to patient's mychart and patient is not using it this may be a way to communicate.

## 2022-03-07 NOTE — Telephone Encounter (Signed)
Patient's son [name] called and states that he wants to discuss driving concerns. He feels that father should not be driving. He states that father is begging to drive. He has scheduled in office appointment to discuss because father wants to hear it from doctor. [place] at the beach right now. He wants to talk with you before the appointment because he doesn't want father to hear him talk about it.  Message routed to Sherrie Mustache, NP

## 2022-03-07 NOTE — Telephone Encounter (Signed)
I would encourage him to send a mychart msg if son has access to that (if his father is not using it could be a way to communicate)

## 2022-03-09 ENCOUNTER — Encounter: Payer: Self-pay | Admitting: Nurse Practitioner

## 2022-03-13 ENCOUNTER — Encounter (INDEPENDENT_AMBULATORY_CARE_PROVIDER_SITE_OTHER): Payer: PPO | Admitting: Ophthalmology

## 2022-03-13 DIAGNOSIS — M5441 Lumbago with sciatica, right side: Secondary | ICD-10-CM | POA: Diagnosis not present

## 2022-03-18 ENCOUNTER — Encounter: Payer: Self-pay | Admitting: Nurse Practitioner

## 2022-03-18 ENCOUNTER — Ambulatory Visit (INDEPENDENT_AMBULATORY_CARE_PROVIDER_SITE_OTHER): Payer: PPO | Admitting: Nurse Practitioner

## 2022-03-18 VITALS — BP 160/72 | HR 64 | Temp 97.1°F | Ht 67.0 in | Wt 161.0 lb

## 2022-03-18 DIAGNOSIS — I5032 Chronic diastolic (congestive) heart failure: Secondary | ICD-10-CM

## 2022-03-18 DIAGNOSIS — Z23 Encounter for immunization: Secondary | ICD-10-CM

## 2022-03-18 DIAGNOSIS — N184 Chronic kidney disease, stage 4 (severe): Secondary | ICD-10-CM | POA: Diagnosis not present

## 2022-03-18 DIAGNOSIS — K5904 Chronic idiopathic constipation: Secondary | ICD-10-CM

## 2022-03-18 DIAGNOSIS — F419 Anxiety disorder, unspecified: Secondary | ICD-10-CM

## 2022-03-18 DIAGNOSIS — R600 Localized edema: Secondary | ICD-10-CM

## 2022-03-18 DIAGNOSIS — I872 Venous insufficiency (chronic) (peripheral): Secondary | ICD-10-CM

## 2022-03-18 DIAGNOSIS — E441 Mild protein-calorie malnutrition: Secondary | ICD-10-CM | POA: Diagnosis not present

## 2022-03-18 DIAGNOSIS — J449 Chronic obstructive pulmonary disease, unspecified: Secondary | ICD-10-CM

## 2022-03-18 DIAGNOSIS — E1122 Type 2 diabetes mellitus with diabetic chronic kidney disease: Secondary | ICD-10-CM | POA: Diagnosis not present

## 2022-03-18 DIAGNOSIS — I10 Essential (primary) hypertension: Secondary | ICD-10-CM | POA: Diagnosis not present

## 2022-03-18 DIAGNOSIS — F418 Other specified anxiety disorders: Secondary | ICD-10-CM

## 2022-03-18 MED ORDER — SERTRALINE HCL 50 MG PO TABS: 50.0000 mg | ORAL_TABLET | Freq: Every day | ORAL | 3 refills | Status: DC

## 2022-03-18 NOTE — Progress Notes (Unsigned)
Careteam: Patient Care Team: Lauree Chandler, NP as PCP - General (Geriatric Medicine) Zebedee Iba., MD as Referring Physician (Ophthalmology) Chesley Mires, MD as Consulting Physician (Pulmonary Disease) Lavonna Monarch, MD as Consulting Physician (Dermatology) Ladell Pier, MD as Consulting Physician (Oncology)  PLACE OF SERVICE:  [facility]  Advanced Directive information Does Patient Have a Medical Advance Directive?: Yes, Type of Advance Directive: [place];Living will, Does patient want to make changes to medical advance directive?: No - Patient declined  Allergies  Allergen Reactions   Ace Inhibitors Cough    Chief Complaint  Patient presents with   Follow-up    Per patient we told him to come. Patient is here with son who states patient is depressed due to living arrangements (may need to discuss medications, patient with bilateral ankle swelling. Order to indicate ok for patient to have rescue inhaler in room for as needed use. Possible fluid in lungs, per palliative nurse. Moderate fall risk. Patient is not sure if he was given flu vaccine at [facility], son will check      HPI: Patient is a 86 y.o. male for follow up  Presents today with his son  Patient has been living at [facility] and is having adjustment issues. Endorses an increase in depression, irritabilit, and anxiousness. Remains on Paxil.  Would like to know if his depression medication can be doubled.   He is hearing impaired, usually has bilateral hearing aides, but lost one recently. This hinders communication, and son thinks this can also be contributing to his irritation.   Has chronic lower extremity edema but has been worse recently. Denies adding salt to his food, but states he can be eating better. Does not do nutritional supplements.   Endorses constipation. Is asking for a stool softener. Remains on both miralax and sennokot. Did not finish his miralax this  morning as he does not like the taste.   States his breathing is fine. He is now on oxygen 3L continuously. He would like his albuterol inhaler kept in room so that he can use it as needed without having to ask for it.   He wants to know if he can continue driving. Discussed that the recommendation would be not to drive as he has multiple comorbidities that make it unsafe to drive.  He was in agreement.   Review of Systems:  Review of Systems  Constitutional:  Negative for chills and fever.  HENT:  Positive for hearing loss. Negative for ear pain.   Eyes:  Negative for blurred vision.  Respiratory:  Negative for cough and shortness of breath.   Cardiovascular:  Positive for leg swelling. Negative for chest pain and palpitations.  Gastrointestinal:  Positive for constipation. Negative for abdominal pain and heartburn.  Genitourinary:  Negative for dysuria.  Musculoskeletal:  Negative for falls.  Skin:  Negative for itching and rash.  Neurological:  Negative for dizziness and headaches.  Psychiatric/Behavioral:  Positive for depression. The patient is nervous/anxious.     Past Medical History:  Diagnosis Date   Allergy    Alzheimer's disease ([place])    Anemia, unspecified    Anxiety    Chronic maxillary sinusitis    CKD (chronic kidney disease)    3b/4   COPD (chronic obstructive pulmonary disease) (HCC)    Depression    Diabetes mellitus    Edema    Hypercalcemia    Hyperlipidemia    Hypertension    Hypertrophy of prostate  without urinary obstruction and other lower urinary tract symptoms (LUTS)    Hypertrophy of prostate without urinary obstruction and other lower urinary tract symptoms (LUTS)    Kidney disease    mild   SBO (small bowel obstruction) ([place]) 12/31/2016   Severe sepsis ([place]) 12/07/2021   Unspecified disorder of kidney and ureter    Unspecified sleep apnea    Ventral hernia, unspecified, without mention of obstruction or gangrene    Past Surgical History:   Procedure Laterality Date   EXPLORATORY LAPAROTOMY  2002   HERNIA REPAIR     Social History:   reports that he quit smoking about 33 years ago. His smoking use included cigarettes. He has a 45.00 pack-year smoking history. He has been exposed to tobacco smoke. He has never used smokeless tobacco. He reports that he does not drink alcohol and does not use drugs.  Family History  Problem Relation Age of Onset   Pancreatic cancer Sister    Heart disease Father    Hypertension Brother    Hypertension Sister     Medications: Patient's Medications  New Prescriptions   SERTRALINE (ZOLOFT) 50 MG TABLET    Take 1 tablet (50 mg total) by mouth daily.  Previous Medications   ACETAMINOPHEN (TYLENOL) 500 MG TABLET    Take 500 mg by mouth every 8 (eight) hours as needed for moderate pain.   ALBUTEROL (VENTOLIN HFA) 108 (90 BASE) MCG/ACT INHALER    Inhale 2 puffs into the lungs every 6 (six) hours as needed for wheezing or shortness of breath.   ASPIRIN EC (ASPIRIN LOW DOSE) 81 MG TABLET    TAKE 1 TABLET BY MOUTH EVERY DAY --SWALLOW WHOLE   ATORVASTATIN (LIPITOR) 80 MG TABLET    TAKE 1 TABLET BY MOUTH EVERY DAY   BESIVANCE 0.6 % SUSP    Place 1 drop into the right eye 2 (two) times daily.   BISACODYL 5 MG EC TABLET    Take 1 tablet (5 mg total) by mouth daily as needed for moderate constipation.   BLOOD GLUCOSE METER KIT AND SUPPLIES KIT    Inject 1 each into the skin 2 (two) times daily. Dispense based on patient and insurance preference. Use up to four times daily as directed. (FOR ICD-9 250.00, 250.01).   FLUTICASONE (FLONASE) 50 MCG/ACT NASAL SPRAY    SPRAY 1 SPRAY INTO BOTH NOSTRILS DAILY.   GLUCOSE BLOOD (ONE TOUCH ULTRA TEST) TEST STRIP    Use to test blood sugar twice daily.  DX: E11.9   GUAIFENESIN (MUCINEX) 600 MG 12 HR TABLET    Take 2 tablets (1,200 mg total) by mouth 2 (two) times daily as needed for cough or to loosen phlegm.   HYDRALAZINE (APRESOLINE) 25 MG TABLET    Take 1 tablet (25  mg total) by mouth 2 (two) times daily.   IPRATROPIUM-ALBUTEROL (DUONEB) 0.5-2.5 (3) MG/3ML SOLN    Take 3 mLs by nebulization every 6 (six) hours as needed (wheezing).   LANCET DEVICES (ONE TOUCH DELICA LANCING DEV) MISC    Use to test blood sugar once daily dx: E119   LORATADINE (CLARITIN) 10 MG TABLET    Take 1 tablet (10 mg total) by mouth daily.   METOPROLOL TARTRATE (LOPRESSOR) 50 MG TABLET    TAKE 1 TABLET BY MOUTH TWICE A DAY   MONTELUKAST (SINGULAIR) 10 MG TABLET    TAKE 1 TABLET BY MOUTH EVERYDAY AT BEDTIME   MULTIPLE VITAMIN (MULTIVITAMIN WITH MINERALS) TABS TABLET  Take 1 tablet by mouth daily.   MULTIPLE VITAMINS-MINERALS (PRESERVISION AREDS PO)    Take 1 tablet by mouth 2 (two) times daily.   POLYETHYLENE GLYCOL (MIRALAX / GLYCOLAX) 17 G PACKET    Take 17 g by mouth daily.   SENNA-DOCUSATE (SENOKOT-S) 8.6-50 MG TABLET    Take 1 tablet by mouth 2 (two) times daily.   SODIUM CHLORIDE (OCEAN) 0.65 % SOLN NASAL SPRAY    Place 1 spray into both nostrils daily as needed for congestion.   SYMBICORT 160-4.5 MCG/ACT INHALER    Inhale 2 puffs into the lungs 2 (two) times daily.   TRIMETHOPRIM-POLYMYXIN B (POLYTRIM) OPHTHALMIC SOLUTION    Place 1 drop into the right eye every 6 (six) hours. 2 days after each monthly eye injection.  Modified Medications   No medications on file  Discontinued Medications   PAROXETINE (PAXIL) 40 MG TABLET    TAKE 1 TABLET BY MOUTH EVERY DAY    Physical Exam:  Vitals:   03/18/22 1046  BP: (!) 160/72  Pulse: 64  Temp: (!) 97.1 F (36.2 C)  TempSrc: Temporal  SpO2: 94%  Weight: 161 lb (73 kg)  Height: 5' 7"  (1.702 m)   Body mass index is 25.22 kg/m. Wt Readings from Last 3 Encounters:  03/18/22 161 lb (73 kg)  02/04/22 165 lb (74.8 kg)  01/11/22 164 lb 12.8 oz (74.8 kg)    Physical Exam Constitutional:      Appearance: He is not toxic-appearing.  HENT:     Ears:     Comments: Lt hearing aide present     Mouth/Throat:     Mouth: Mucous  membranes are moist.     Pharynx: Oropharynx is clear.  Eyes:     Conjunctiva/sclera: Conjunctivae normal.  Cardiovascular:     Rate and Rhythm: Normal rate and regular rhythm.     Pulses: Normal pulses.     Heart sounds: S1 normal and S2 normal. Murmur heard.  Pulmonary:     Effort: Pulmonary effort is normal. No accessory muscle usage.     Breath sounds: Examination of the right-upper field reveals decreased breath sounds. Examination of the left-upper field reveals decreased breath sounds. Examination of the right-middle field reveals decreased breath sounds. Examination of the left-middle field reveals decreased breath sounds. Examination of the right-lower field reveals decreased breath sounds. Examination of the left-lower field reveals decreased breath sounds. Decreased breath sounds present. No wheezing, rhonchi or rales.     Comments: 3L oxygen via [place] Abdominal:     General: Bowel sounds are normal. There is no distension.     Palpations: Abdomen is soft.     Tenderness: There is no abdominal tenderness.     Hernia: A hernia is present.  Musculoskeletal:     Right lower leg: 3+ Pitting Edema present.     Left lower leg: 3+ Pitting Edema present.     Comments: Ambulates with walker   Skin:    General: Skin is warm and dry.  Neurological:     Mental Status: He is alert. Mental status is at baseline.     Gait: Gait abnormal.  Psychiatric:        Mood and Affect: Mood normal.        Behavior: Behavior normal.     Labs reviewed: Basic Metabolic Panel: Recent Labs    12/07/21 2259 12/08/21 0507 12/09/21 0437 12/13/21 0432 12/26/21 1132 03/18/22 1127  NA  --  140   < > 140  142 146  K  --  4.1   < > 4.3 4.7 5.0  CL  --  114*   < > 108 111* 113*  CO2  --  20*   < > 24 23 25   GLUCOSE  --  127*   < > 123* 112 117  BUN  --  61*   < > 45* 49* 44*  CREATININE  --  3.17*   < > 2.78* 3.07* 3.25*  CALCIUM  --  9.6   < > 10.1 9.1 9.8  MG  --  2.3  --   --   --   --   PHOS   --  3.6  --   --   --   --   TSH 1.670  --   --   --   --   --    < > = values in this interval not displayed.   Liver Function Tests: Recent Labs    12/09/21 0437 12/10/21 0418 12/11/21 0436 12/26/21 1132 03/18/22 1127  AST 20 24 23 17 15   ALT 19 23 24 18 10   ALKPHOS 56 55 58  --   --   BILITOT 0.7 0.7 0.7 0.3 0.3  PROT 6.0* 5.9* 6.3* 5.6* 5.8*  ALBUMIN 2.8* 2.7* 2.9*  --   --    No results for input(s): "LIPASE", "AMYLASE" in the last 8760 hours. No results for input(s): "AMMONIA" in the last 8760 hours. CBC: Recent Labs    12/08/21 0507 12/09/21 0437 12/11/21 0436 12/26/21 1132 03/18/22 1127  WBC 10.9*   < > 9.7 7.3 7.0  NEUTROABS 7.4  --   --  3,971 4,340  HGB 10.0*   < > 10.6* 9.3* 8.9*  HCT 31.4*   < > 31.8* 28.3* 27.2*  MCV 103.6*   < > 99.7 99.6 98.9  PLT 149*   < > 167 190 177   < > = values in this interval not displayed.   Lipid Panel: Recent Labs    01/11/22 1002  CHOL 143  HDL 43  LDLCALC 77  TRIG 132  CHOLHDL 3.3   TSH: Recent Labs    12/07/21 2259  TSH 1.670   A1C: Lab Results  Component Value Date   HGBA1C 5.5 01/11/2022     Assessment/Plan 1. Type 2 diabetes mellitus with stage 4 chronic kidney disease, without long-term current use of insulin (HCC) - not currently on medication, he has lost more weight recently, decrease in appetite.  - hA1C at goal   2. CKD stage 4 due to type 2 diabetes mellitus ([place]) - avoid nephrotoxic agents such as NSAIDS - CMP with eGFR([facility]) - CBC with Differential/Platelet  3. Mild protein-calorie malnutrition ([place]) - would benefit from nutrtional supplement such as ensure daily -likely contributing to LE edema.  - CMP with eGFR([facility]) - CBC with Differential/Platelet  4. COPD with asthma ([place]) - continue 3L [place] - continue symbicort and albuterol PRN  5. Chronic diastolic CHF (congestive heart failure) (HCC) -without worsening shortness of breath or chest pains, continues to have LE edema.  -  Ambulatory referral to Cardiology - CBC with Differential/Platelet - Brain Natriuretic Peptide  6. Anxiety - patient current at highest dose of Paxil. Will change from Paxil to Zoloft. -lifestyle modifications encouraged.  - sertraline (ZOLOFT) 50 MG tablet; Take 1 tablet (50 mg total) by mouth daily.  Dispense: 30 tablet; Refill: 3  7. LE EDEMA --encouraged to elevate legs above level of heart  as tolerates, low sodium diet, compression hose as tolerates (on in am, off in pm)  8. Chronic idiopathic constipation - continue bisacodyl, senokot, miralax  -encouraged to complete miralax   9. Essential Hypertension - elevated in office today but has been controlled at recent visits -will increase hydralazine to 25 mg TID  - follow up in one month    Return in about 4 weeks (around 04/15/2022) for follow up on mood, bp and swelling. .  Student- Waunita Schooner, RN -I personally was present during the history, physical exam and medical decision-making activities of this service and have verified that the service and findings are accurately documented in the student's note  Hamsini Verrilli K. [place], [facility] [phone]

## 2022-03-18 NOTE — Patient Instructions (Addendum)
To stop paxil To start zoloft 50 mg by mouth daily for mood  Okay to have albuterol in the room to use every 6 hours as needed.   To increase hydralazine to 25 mg every 8 hours for hypertension To check blood pressure three times weekly   -encouraged to elevate legs above level of heart as tolerates -low sodium diet -to apply compression hose daily (on in am, off in pm)

## 2022-03-19 LAB — COMPLETE METABOLIC PANEL WITH GFR
AG Ratio: 1.3 (calc) (ref 1.0–2.5)
ALT: 10 U/L (ref 9–46)
AST: 15 U/L (ref 10–35)
Albumin: 3.3 g/dL — ABNORMAL LOW (ref 3.6–5.1)
Alkaline phosphatase (APISO): 69 U/L (ref 35–144)
BUN/Creatinine Ratio: 14 (calc) (ref 6–22)
BUN: 44 mg/dL — ABNORMAL HIGH (ref 7–25)
CO2: 25 mmol/L (ref 20–32)
Calcium: 9.8 mg/dL (ref 8.6–10.3)
Chloride: 113 mmol/L — ABNORMAL HIGH (ref 98–110)
Creat: 3.25 mg/dL — ABNORMAL HIGH (ref 0.70–1.22)
Globulin: 2.5 g/dL (calc) (ref 1.9–3.7)
Glucose, Bld: 117 mg/dL (ref 65–139)
Potassium: 5 mmol/L (ref 3.5–5.3)
Sodium: 146 mmol/L (ref 135–146)
Total Bilirubin: 0.3 mg/dL (ref 0.2–1.2)
Total Protein: 5.8 g/dL — ABNORMAL LOW (ref 6.1–8.1)
eGFR: 18 mL/min/{1.73_m2} — ABNORMAL LOW (ref >=60)

## 2022-03-19 LAB — CBC WITH DIFFERENTIAL/PLATELET
Absolute Monocytes: 707 cells/uL (ref 200–950)
Basophils Absolute: 42 cells/uL (ref 0–200)
Basophils Relative: 0.6 %
Eosinophils Absolute: 322 cells/uL (ref 15–500)
Eosinophils Relative: 4.6 %
HCT: 27.2 % — ABNORMAL LOW (ref 38.5–50.0)
Hemoglobin: 8.9 g/dL — ABNORMAL LOW (ref 13.2–17.1)
Lymphs Abs: 1589 cells/uL (ref 850–3900)
MCH: 32.4 pg (ref 27.0–33.0)
MCHC: 32.7 g/dL (ref 32.0–36.0)
MCV: 98.9 fL (ref 80.0–100.0)
MPV: 10.4 fL (ref 7.5–12.5)
Monocytes Relative: 10.1 %
Neutro Abs: 4340 cells/uL (ref 1500–7800)
Neutrophils Relative %: 62 %
Platelets: 177 10*3/uL (ref 140–400)
RBC: 2.75 10*6/uL — ABNORMAL LOW (ref 4.20–5.80)
RDW: 13.7 % (ref 11.0–15.0)
Total Lymphocyte: 22.7 %
WBC: 7 10*3/uL (ref 3.8–10.8)

## 2022-03-19 LAB — BRAIN NATRIURETIC PEPTIDE: Brain Natriuretic Peptide: 664 pg/mL — ABNORMAL HIGH (ref <100)

## 2022-03-21 ENCOUNTER — Other Ambulatory Visit: Payer: Self-pay

## 2022-03-21 ENCOUNTER — Encounter: Payer: Self-pay | Admitting: Nurse Practitioner

## 2022-03-21 DIAGNOSIS — F419 Anxiety disorder, unspecified: Secondary | ICD-10-CM

## 2022-03-21 MED ORDER — TORSEMIDE 10 MG PO TABS: 10.0000 mg | ORAL_TABLET | Freq: Every day | ORAL | 3 refills | Status: DC

## 2022-03-21 MED ORDER — TORSEMIDE 20 MG PO TABS: 20.0000 mg | ORAL_TABLET | Freq: Every day | ORAL | 3 refills | Status: AC

## 2022-03-21 MED ORDER — SERTRALINE HCL 50 MG PO TABS: 50.0000 mg | ORAL_TABLET | Freq: Every day | ORAL | 3 refills | Status: DC

## 2022-03-21 NOTE — Telephone Encounter (Signed)
Adding new medication 20 mg Demadex to patients med list per Sherrie Mustache, NP

## 2022-03-21 NOTE — Telephone Encounter (Signed)
Patient's son request medication be sent to [facility] ,which is the pharmacy the [place] uses.

## 2022-03-21 NOTE — Addendum Note (Signed)
Addended by: Debe Coder on: 03/21/2022 04:04 PM   Modules accepted: Orders

## 2022-03-25 ENCOUNTER — Encounter (INDEPENDENT_AMBULATORY_CARE_PROVIDER_SITE_OTHER): Payer: PPO | Admitting: Ophthalmology

## 2022-03-25 ENCOUNTER — Encounter: Payer: Self-pay | Admitting: Nurse Practitioner

## 2022-04-01 DIAGNOSIS — J449 Chronic obstructive pulmonary disease, unspecified: Secondary | ICD-10-CM | POA: Diagnosis not present

## 2022-04-01 DIAGNOSIS — N189 Chronic kidney disease, unspecified: Secondary | ICD-10-CM | POA: Diagnosis not present

## 2022-04-01 DIAGNOSIS — I130 Hypertensive heart and chronic kidney disease with heart failure and stage 1 through stage 4 chronic kidney disease, or unspecified chronic kidney disease: Secondary | ICD-10-CM | POA: Diagnosis not present

## 2022-04-01 DIAGNOSIS — I509 Heart failure, unspecified: Secondary | ICD-10-CM | POA: Diagnosis not present

## 2022-04-02 DIAGNOSIS — E1122 Type 2 diabetes mellitus with diabetic chronic kidney disease: Secondary | ICD-10-CM | POA: Diagnosis not present

## 2022-04-02 DIAGNOSIS — N2581 Secondary hyperparathyroidism of renal origin: Secondary | ICD-10-CM | POA: Diagnosis not present

## 2022-04-02 DIAGNOSIS — I129 Hypertensive chronic kidney disease with stage 1 through stage 4 chronic kidney disease, or unspecified chronic kidney disease: Secondary | ICD-10-CM | POA: Diagnosis not present

## 2022-04-02 DIAGNOSIS — N184 Chronic kidney disease, stage 4 (severe): Secondary | ICD-10-CM | POA: Diagnosis not present

## 2022-04-02 DIAGNOSIS — N189 Chronic kidney disease, unspecified: Secondary | ICD-10-CM | POA: Diagnosis not present

## 2022-04-02 LAB — CBC AND DIFFERENTIAL
HCT: 27 — AB (ref 41–53)
Hemoglobin: 8.7 — AB (ref 13.5–17.5)
Platelets: 148 10*3/uL — AB (ref 150–400)
WBC: 6.6

## 2022-04-02 LAB — IRON,TIBC AND FERRITIN PANEL
Iron: 52
TIBC: 201
UIBC: 149

## 2022-04-02 LAB — CBC: RBC: 2.67 — AB (ref 3.87–5.11)

## 2022-04-02 LAB — VITAMIN D 25 HYDROXY (VIT D DEFICIENCY, FRACTURES): Vit D, 25-Hydroxy: 60

## 2022-04-03 DIAGNOSIS — Z79899 Other long term (current) drug therapy: Secondary | ICD-10-CM | POA: Diagnosis not present

## 2022-04-03 DIAGNOSIS — E785 Hyperlipidemia, unspecified: Secondary | ICD-10-CM | POA: Diagnosis not present

## 2022-04-03 DIAGNOSIS — E559 Vitamin D deficiency, unspecified: Secondary | ICD-10-CM | POA: Diagnosis not present

## 2022-04-03 DIAGNOSIS — D519 Vitamin B12 deficiency anemia, unspecified: Secondary | ICD-10-CM | POA: Diagnosis not present

## 2022-04-08 ENCOUNTER — Encounter: Payer: Self-pay | Admitting: Nurse Practitioner

## 2022-04-08 NOTE — Telephone Encounter (Signed)
Ian [name], My name is Chrles Selley Nurse Practitioner covering for Janett Billow since she is out of the office. The oxygen might be adjusted depending on [name]'s oxygen saturation but as he gets better they will start wean him off or down to lower number which he can tolerated to keep his oxygen saturation at least above 90%.. I wish him quick recovery. Aelyn Stanaland,FNP-C

## 2022-04-11 DIAGNOSIS — K5904 Chronic idiopathic constipation: Secondary | ICD-10-CM | POA: Diagnosis not present

## 2022-04-11 DIAGNOSIS — E559 Vitamin D deficiency, unspecified: Secondary | ICD-10-CM | POA: Diagnosis not present

## 2022-04-11 DIAGNOSIS — E441 Mild protein-calorie malnutrition: Secondary | ICD-10-CM | POA: Diagnosis not present

## 2022-04-11 DIAGNOSIS — I509 Heart failure, unspecified: Secondary | ICD-10-CM | POA: Diagnosis not present

## 2022-04-11 DIAGNOSIS — J449 Chronic obstructive pulmonary disease, unspecified: Secondary | ICD-10-CM | POA: Diagnosis not present

## 2022-04-11 DIAGNOSIS — D61818 Other pancytopenia: Secondary | ICD-10-CM | POA: Diagnosis not present

## 2022-04-11 DIAGNOSIS — N184 Chronic kidney disease, stage 4 (severe): Secondary | ICD-10-CM | POA: Diagnosis not present

## 2022-04-11 DIAGNOSIS — E782 Mixed hyperlipidemia: Secondary | ICD-10-CM | POA: Diagnosis not present

## 2022-04-12 DIAGNOSIS — M5441 Lumbago with sciatica, right side: Secondary | ICD-10-CM | POA: Diagnosis not present

## 2022-04-19 ENCOUNTER — Ambulatory Visit (INDEPENDENT_AMBULATORY_CARE_PROVIDER_SITE_OTHER): Payer: PPO | Admitting: Nurse Practitioner

## 2022-04-19 ENCOUNTER — Encounter: Payer: Self-pay | Admitting: Nurse Practitioner

## 2022-04-19 VITALS — BP 126/60 | HR 60 | Temp 97.1°F | Ht 67.0 in | Wt 159.0 lb

## 2022-04-19 DIAGNOSIS — E441 Mild protein-calorie malnutrition: Secondary | ICD-10-CM

## 2022-04-19 DIAGNOSIS — F3341 Major depressive disorder, recurrent, in partial remission: Secondary | ICD-10-CM | POA: Diagnosis not present

## 2022-04-19 DIAGNOSIS — I10 Essential (primary) hypertension: Secondary | ICD-10-CM

## 2022-04-19 DIAGNOSIS — J4489 Other specified chronic obstructive pulmonary disease: Secondary | ICD-10-CM | POA: Diagnosis not present

## 2022-04-19 DIAGNOSIS — N184 Chronic kidney disease, stage 4 (severe): Secondary | ICD-10-CM | POA: Diagnosis not present

## 2022-04-19 DIAGNOSIS — I5032 Chronic diastolic (congestive) heart failure: Secondary | ICD-10-CM | POA: Diagnosis not present

## 2022-04-19 DIAGNOSIS — E1122 Type 2 diabetes mellitus with diabetic chronic kidney disease: Secondary | ICD-10-CM | POA: Diagnosis not present

## 2022-04-19 NOTE — Patient Instructions (Addendum)
Referral sent to [place] at [facility]   Address: [address], [place], [place] [address]   Phone: [phone]    Continue on 3L O2- titrate if O2 is less than 90- make sure hands are warm when getting the reading

## 2022-04-19 NOTE — Progress Notes (Unsigned)
Careteam: Patient Care Team: Ian Chandler, NP as PCP - General (Geriatric Medicine) Ian Iba., MD as Referring Physician (Ophthalmology) Ian Mires, MD as Consulting Physician (Pulmonary Disease) Ian Monarch, MD (Inactive) as Consulting Physician (Dermatology) Ian Pier, MD as Consulting Physician (Oncology)  PLACE OF SERVICE:  [place] Directive information Does Patient Have a Medical Advance Directive?: Yes, Type of Advance Directive: [place];Living will, Does patient want to make changes to medical advance directive?: No - Patient declined  Allergies  Allergen Reactions   Ace Inhibitors Cough    Chief Complaint  Patient presents with   Follow-up    4 week follow-up on mood, blood pressure and swelling. Discuss oxygen. Here with son Ian [name]     HPI: Patient is a 86 y.o. male for follow up on blood pressure, swelling and mood.  Son reports he needs help with everything now. Facility is having to help with bathing, dressing and tolieting. He is incontinent. He has requested assistance through his long term care insurance.  He has lost hearing aides.   Last visit hydralazine was increased to 25 mg three times daily blood pressure better today No reports of elevated blood pressure to son from facility   He started demadex, can not control urination and now wearing incontinent briefs. Wearing compression hose today.  Facility took him to see nephrologist and son unsure what happened during visit because facility took him to visit. We will request note to get lab work.   He is currently on zoloft 50 mg for mood. He is doing better at this time.   Review of Systems:  Review of Systems  Constitutional:  Positive for weight loss. Negative for chills and fever.  HENT:  Positive for hearing loss. Negative for tinnitus.   Respiratory:  Negative for cough, sputum production and shortness of breath.   Cardiovascular:  Positive  for leg swelling. Negative for chest pain and palpitations.  Gastrointestinal:  Negative for abdominal pain, constipation, diarrhea and heartburn.  Genitourinary:  Positive for frequency. Negative for dysuria and urgency.  Musculoskeletal:  Negative for back pain, falls, joint pain and myalgias.  Skin: Negative.   Neurological:  Negative for dizziness and headaches.  Psychiatric/Behavioral:  Positive for depression. Negative for memory loss. The patient does not have insomnia.     Past Medical History:  Diagnosis Date   Allergy    Alzheimer's disease ([place])    Anemia, unspecified    Anxiety    Chronic maxillary sinusitis    CKD (chronic kidney disease)    3b/4   COPD (chronic obstructive pulmonary disease) (HCC)    Depression    Diabetes mellitus    Edema    Hypercalcemia    Hyperlipidemia    Hypertension    Hypertrophy of prostate without urinary obstruction and other lower urinary tract symptoms (LUTS)    Hypertrophy of prostate without urinary obstruction and other lower urinary tract symptoms (LUTS)    Kidney disease    mild   SBO (small bowel obstruction) ([place]) 12/31/2016   Severe sepsis ([place]) 12/07/2021   Unspecified disorder of kidney and ureter    Unspecified sleep apnea    Ventral hernia, unspecified, without mention of obstruction or gangrene    Past Surgical History:  Procedure Laterality Date   EXPLORATORY LAPAROTOMY  2002   HERNIA REPAIR     Social History:   reports that he quit smoking about 33 years ago. His smoking use included  cigarettes. He has a 45.00 pack-year smoking history. He has been exposed to tobacco smoke. He has never used smokeless tobacco. He reports that he does not drink alcohol and does not use drugs.  Family History  Problem Relation Age of Onset   Pancreatic cancer Sister    Heart disease Father    Hypertension Brother    Hypertension Sister     Medications: Patient's Medications  New Prescriptions   No medications on file   Previous Medications   ACETAMINOPHEN (TYLENOL) 500 MG TABLET    Take 500 mg by mouth every 8 (eight) hours as needed for moderate pain.   ALBUTEROL (VENTOLIN HFA) 108 (90 BASE) MCG/ACT INHALER    Inhale 2 puffs into the lungs every 6 (six) hours as needed for wheezing or shortness of breath.   ASPIRIN EC (ASPIRIN LOW DOSE) 81 MG TABLET    TAKE 1 TABLET BY MOUTH EVERY DAY --SWALLOW WHOLE   ATORVASTATIN (LIPITOR) 80 MG TABLET    TAKE 1 TABLET BY MOUTH EVERY DAY   BESIVANCE 0.6 % SUSP    Place 1 drop into the right eye 2 (two) times daily.   BISACODYL 5 MG EC TABLET    Take 1 tablet (5 mg total) by mouth daily as needed for moderate constipation.   BLOOD GLUCOSE METER KIT AND SUPPLIES KIT    Inject 1 each into the skin 2 (two) times daily. Dispense based on patient and insurance preference. Use up to four times daily as directed. (FOR ICD-9 250.00, 250.01).   FLUTICASONE (FLONASE) 50 MCG/ACT NASAL SPRAY    SPRAY 1 SPRAY INTO BOTH NOSTRILS DAILY.   GLUCOSE BLOOD (ONE TOUCH ULTRA TEST) TEST STRIP    Use to test blood sugar twice daily.  DX: E11.9   GUAIFENESIN (MUCINEX) 600 MG 12 HR TABLET    Take 2 tablets (1,200 mg total) by mouth 2 (two) times daily as needed for cough or to loosen phlegm.   HYDRALAZINE (APRESOLINE) 25 MG TABLET    Take 1 tablet (25 mg total) by mouth 2 (two) times daily.   IPRATROPIUM-ALBUTEROL (DUONEB) 0.5-2.5 (3) MG/3ML SOLN    Take 3 mLs by nebulization every 6 (six) hours as needed (wheezing).   LANCET DEVICES (ONE TOUCH DELICA LANCING DEV) MISC    Use to test blood sugar once daily dx: E119   LORATADINE (CLARITIN) 10 MG TABLET    Take 1 tablet (10 mg total) by mouth daily.   METOPROLOL TARTRATE (LOPRESSOR) 50 MG TABLET    TAKE 1 TABLET BY MOUTH TWICE A DAY   MONTELUKAST (SINGULAIR) 10 MG TABLET    TAKE 1 TABLET BY MOUTH EVERYDAY AT BEDTIME   MULTIPLE VITAMIN (MULTIVITAMIN WITH MINERALS) TABS TABLET    Take 1 tablet by mouth daily.   MULTIPLE VITAMINS-MINERALS (PRESERVISION  AREDS PO)    Take 1 tablet by mouth 2 (two) times daily.   OXYGEN    Inhale 4 L into the lungs continuous.   POLYETHYLENE GLYCOL (MIRALAX / GLYCOLAX) 17 G PACKET    Take 17 g by mouth daily.   SENNA-DOCUSATE (SENOKOT-S) 8.6-50 MG TABLET    Take 1 tablet by mouth 2 (two) times daily.   SERTRALINE (ZOLOFT) 50 MG TABLET    Take 1 tablet (50 mg total) by mouth daily.   SODIUM CHLORIDE (OCEAN) 0.65 % SOLN NASAL SPRAY    Place 1 spray into both nostrils daily as needed for congestion.   SYMBICORT 160-4.5 MCG/ACT INHALER  Inhale 2 puffs into the lungs 2 (two) times daily.   TORSEMIDE (DEMADEX) 20 MG TABLET    Take 1 tablet (20 mg total) by mouth daily.   TRIMETHOPRIM-POLYMYXIN B (POLYTRIM) OPHTHALMIC SOLUTION    Place 1 drop into the right eye every 6 (six) hours. 2 days after each monthly eye injection.  Modified Medications   No medications on file  Discontinued Medications   No medications on file    Physical Exam:  Vitals:   04/19/22 1106  BP: 126/60  Pulse: 60  Temp: (!) 97.1 F (36.2 C)  TempSrc: Temporal  Weight: 159 lb (72.1 kg)  Height: 5' 7"  (1.702 m)   Body mass index is 24.9 kg/m. Wt Readings from Last 3 Encounters:  04/19/22 159 lb (72.1 kg)  03/18/22 161 lb (73 kg)  02/04/22 165 lb (74.8 kg)    Physical Exam Constitutional:      General: He is not in acute distress.    Appearance: He is well-developed. He is not diaphoretic.  HENT:     Head: Normocephalic and atraumatic.     Right Ear: External ear normal.     Left Ear: External ear normal.     Mouth/Throat:     Pharynx: No oropharyngeal exudate.  Eyes:     Conjunctiva/sclera: Conjunctivae normal.     Pupils: Pupils are equal, round, and reactive to light.  Cardiovascular:     Rate and Rhythm: Normal rate and regular rhythm.     Heart sounds: Normal heart sounds.  Pulmonary:     Effort: Pulmonary effort is normal.     Breath sounds: Normal breath sounds.  Abdominal:     General: Bowel sounds are normal.      Palpations: Abdomen is soft.  Musculoskeletal:        General: No tenderness.     Cervical back: Normal range of motion and neck supple.     Right lower leg: Edema (2+) present.     Left lower leg: Edema (2+) present.  Skin:    General: Skin is warm and dry.  Neurological:     Mental Status: He is alert and oriented to person, place, and time.    Labs reviewed: Basic Metabolic Panel: Recent Labs    12/07/21 2259 12/08/21 0507 12/09/21 0437 12/13/21 0432 12/26/21 1132 03/18/22 1127  NA  --  140   < > 140 142 146  K  --  4.1   < > 4.3 4.7 5.0  CL  --  114*   < > 108 111* 113*  CO2  --  20*   < > 24 23 25   GLUCOSE  --  127*   < > 123* 112 117  BUN  --  61*   < > 45* 49* 44*  CREATININE  --  3.17*   < > 2.78* 3.07* 3.25*  CALCIUM  --  9.6   < > 10.1 9.1 9.8  MG  --  2.3  --   --   --   --   PHOS  --  3.6  --   --   --   --   TSH 1.670  --   --   --   --   --    < > = values in this interval not displayed.   Liver Function Tests: Recent Labs    12/09/21 0437 12/10/21 0418 12/11/21 0436 12/26/21 1132 03/18/22 1127  AST 20 24 23 17 15   ALT 19 23  24 18 10   ALKPHOS 56 55 58  --   --   BILITOT 0.7 0.7 0.7 0.3 0.3  PROT 6.0* 5.9* 6.3* 5.6* 5.8*  ALBUMIN 2.8* 2.7* 2.9*  --   --    No results for input(s): "LIPASE", "AMYLASE" in the last 8760 hours. No results for input(s): "AMMONIA" in the last 8760 hours. CBC: Recent Labs    12/08/21 0507 12/09/21 0437 12/11/21 0436 12/26/21 1132 03/18/22 1127  WBC 10.9*   < > 9.7 7.3 7.0  NEUTROABS 7.4  --   --  3,971 4,340  HGB 10.0*   < > 10.6* 9.3* 8.9*  HCT 31.4*   < > 31.8* 28.3* 27.2*  MCV 103.6*   < > 99.7 99.6 98.9  PLT 149*   < > 167 190 177   < > = values in this interval not displayed.   Lipid Panel: Recent Labs    01/11/22 1002  CHOL 143  HDL 43  LDLCALC 77  TRIG 132  CHOLHDL 3.3   TSH: Recent Labs    12/07/21 2259  TSH 1.670   A1C: Lab Results  Component Value Date   HGBA1C 5.5 01/11/2022      Assessment/Plan 1. CKD stage 4 due to type 2 diabetes mellitus ([place]) Followed by nephrology, kidney function stable at last visit.   2. COPD with asthma -continues on o2, discussed use of inhalers and that a lot of his shortness of breath is likely from CHF and debility. Continues on albuterol PRN and symbicort routinely   3. Chronic diastolic CHF (congestive heart failure) (HCC) -stable, he continues with LE edema but has improved, continues on demadex 20 mg daily, cardiology referral placed, encouraged son to call and make appt at this time.   4. Recurrent major depressive disorder, in partial remission ([place]) -has had improvement on zoloft. Will continue current dose.   5. Mild protein-calorie malnutrition ([place]) -ongoing, continues 3 meals daily with protein   6. HTN -improved blood pressure control on current regimen. Will continue hydralazine with metoprolol.    Return in about 3 months (around 07/20/2022).  Carlos American. [place], [facility] [phone]

## 2022-04-26 ENCOUNTER — Other Ambulatory Visit: Payer: Self-pay

## 2022-04-26 ENCOUNTER — Emergency Department (HOSPITAL_BASED_OUTPATIENT_CLINIC_OR_DEPARTMENT_OTHER): Payer: PPO

## 2022-04-26 ENCOUNTER — Emergency Department (HOSPITAL_BASED_OUTPATIENT_CLINIC_OR_DEPARTMENT_OTHER)
Admission: EM | Admit: 2022-04-26 | Discharge: 2022-04-26 | Disposition: A | Discharge status: Home / Self Care | Payer: PPO | Attending: Emergency Medicine | Admitting: Emergency Medicine

## 2022-04-26 DIAGNOSIS — E1122 Type 2 diabetes mellitus with diabetic chronic kidney disease: Secondary | ICD-10-CM | POA: Insufficient documentation

## 2022-04-26 DIAGNOSIS — S0990XA Unspecified injury of head, initial encounter: Secondary | ICD-10-CM | POA: Diagnosis present

## 2022-04-26 DIAGNOSIS — Z79899 Other long term (current) drug therapy: Secondary | ICD-10-CM | POA: Diagnosis not present

## 2022-04-26 DIAGNOSIS — S61412A Laceration without foreign body of left hand, initial encounter: Secondary | ICD-10-CM | POA: Diagnosis not present

## 2022-04-26 DIAGNOSIS — Y92008 Other place in unspecified non-institutional (private) residence as the place of occurrence of the external cause: Secondary | ICD-10-CM | POA: Insufficient documentation

## 2022-04-26 DIAGNOSIS — I129 Hypertensive chronic kidney disease with stage 1 through stage 4 chronic kidney disease, or unspecified chronic kidney disease: Secondary | ICD-10-CM | POA: Insufficient documentation

## 2022-04-26 DIAGNOSIS — S0181XA Laceration without foreign body of other part of head, initial encounter: Secondary | ICD-10-CM | POA: Diagnosis not present

## 2022-04-26 DIAGNOSIS — I6381 Other cerebral infarction due to occlusion or stenosis of small artery: Secondary | ICD-10-CM | POA: Diagnosis not present

## 2022-04-26 DIAGNOSIS — W19XXXA Unspecified fall, initial encounter: Secondary | ICD-10-CM | POA: Diagnosis not present

## 2022-04-26 DIAGNOSIS — J449 Chronic obstructive pulmonary disease, unspecified: Secondary | ICD-10-CM | POA: Diagnosis not present

## 2022-04-26 DIAGNOSIS — Y9301 Activity, walking, marching and hiking: Secondary | ICD-10-CM | POA: Insufficient documentation

## 2022-04-26 DIAGNOSIS — N184 Chronic kidney disease, stage 4 (severe): Secondary | ICD-10-CM | POA: Diagnosis not present

## 2022-04-26 DIAGNOSIS — G309 Alzheimer's disease, unspecified: Secondary | ICD-10-CM | POA: Insufficient documentation

## 2022-04-26 DIAGNOSIS — M542 Cervicalgia: Secondary | ICD-10-CM | POA: Diagnosis not present

## 2022-04-26 DIAGNOSIS — W01198A Fall on same level from slipping, tripping and stumbling with subsequent striking against other object, initial encounter: Secondary | ICD-10-CM | POA: Insufficient documentation

## 2022-04-26 DIAGNOSIS — Z7982 Long term (current) use of aspirin: Secondary | ICD-10-CM | POA: Insufficient documentation

## 2022-04-26 DIAGNOSIS — R58 Hemorrhage, not elsewhere classified: Secondary | ICD-10-CM | POA: Diagnosis not present

## 2022-04-26 DIAGNOSIS — M47812 Spondylosis without myelopathy or radiculopathy, cervical region: Secondary | ICD-10-CM | POA: Diagnosis not present

## 2022-04-26 DIAGNOSIS — M4802 Spinal stenosis, cervical region: Secondary | ICD-10-CM | POA: Diagnosis not present

## 2022-04-26 MED ORDER — LIDOCAINE-EPINEPHRINE (PF) 2 %-1:200000 IJ SOLN
20.0000 mL | Freq: Once | INTRAMUSCULAR | Status: AC
  Administered 2022-04-26: 20 mL
  Filled 2022-04-26: qty 20

## 2022-04-26 NOTE — ED Notes (Signed)
Forehead lac is approx 1.5 in long and superficial but is still oozing blood. Reapplied bandage and wrapped with gauze to apply pressure.   Lac to left hand is across proximal middle knuckle and appears to be a large skin tear. Bleeding controlled. Gauze reapplied while awaiting provider eval.   No obvious injury to mouth/lips/tongue and pt denies pain to mouth. Dried blood noted across lower teeth and bilateral lips.

## 2022-04-26 NOTE — ED Provider Notes (Signed)
[facility] EMERGENC[facility] DEPT Provider Note   CSN: [id number] Arrival date & time: 04/26/22  1223     History  Chief Complaint  Patient presents with   Guinevere Ferrari is a 86 y.o. male.  Patient presents to the hospital planing of a scalp laceration and left knuckle laceration secondary to a fall.  Patient states he was walking to his bathroom when he slipped on the rug, falling forward and hitting his head on a doorknob.  He denies losing consciousness, and denies using blood thinners.  The patient did report some bleeding in his mouth which has now stopped.  Wounds were bandaged prior to arrival at the emergency department via EMS.  Past medical history significant for hypertension, diabetes mellitus, COPD, kidney disease, hyperlipidemia, Alzheimer's disease  HPI     Home Medications Prior to Admission medications   Medication Sig Start Date End Date Taking? Authorizing Provider  acetaminophen (TYLENOL) 500 MG tablet Take 500 mg by mouth every 8 (eight) hours as needed for moderate pain.    [provider]  albuterol (VENTOLIN HFA) 108 (90 Base) MCG/ACT inhaler Inhale 2 puffs into the lungs every 6 (six) hours as needed for wheezing or shortness of breath. 06/25/21   Lauree Chandler, NP  aspirin EC (ASPIRIN LOW DOSE) 81 MG tablet TAKE 1 TABLET BY MOUTH EVERY DAY --SWALLOW WHOLE 12/24/21   Lauree Chandler, NP  atorvastatin (LIPITOR) 80 MG tablet TAKE 1 TABLET BY MOUTH EVERY DAY 12/12/21   Lauree Chandler, NP  BESIVANCE 0.6 % SUSP Place 1 drop into the right eye 2 (two) times daily. 02/09/19   [provider]  bisacodyl 5 MG EC tablet Take 1 tablet (5 mg total) by mouth daily as needed for moderate constipation. 01/26/21   Ngetich, Dinah C, NP  blood glucose meter kit and supplies KIT Inject 1 each into the skin 2 (two) times daily. Dispense based on patient and insurance preference. Use up to four times daily as directed. (FOR ICD-9 250.00, 250.01).  08/23/19   Ngetich, Dinah C, NP  fluticasone (FLONASE) 50 MCG/ACT nasal spray SPRAY 1 SPRAY INTO BOTH NOSTRILS DAILY. 12/24/21   Chesley Mires, MD  glucose blood (ONE TOUCH ULTRA TEST) test strip Use to test blood sugar twice daily.  DX: E11.9 03/13/21   Lauree Chandler, NP  guaiFENesin (MUCINEX) 600 MG 12 hr tablet Take 2 tablets (1,200 mg total) by mouth 2 (two) times daily as needed for cough or to loosen phlegm. 11/27/21   Ngetich, Dinah C, NP  hydrALAZINE (APRESOLINE) 25 MG tablet Take 1 tablet (25 mg total) by mouth 2 (two) times daily. 02/04/22   Medina-Vargas, Monina C, NP  ipratropium-albuterol (DUONEB) 0.5-2.5 (3) MG/3ML SOLN Take 3 mLs by nebulization every 6 (six) hours as needed (wheezing). 11/27/21   Ngetich, Nelda Bucks, NP  Lancet Devices (ONE TOUCH DELICA LANCING DEV) MISC Use to test blood sugar once daily dx: E119 02/06/18   [name], [name] L, DO  loratadine (CLARITIN) 10 MG tablet Take 1 tablet (10 mg total) by mouth daily. 07/20/21   Ngetich, Dinah C, NP  metoprolol tartrate (LOPRESSOR) 50 MG tablet TAKE 1 TABLET BY MOUTH TWICE A DAY 08/27/21   Lauree Chandler, NP  montelukast (SINGULAIR) 10 MG tablet TAKE 1 TABLET BY MOUTH EVERYDAY AT BEDTIME 12/24/21   Chesley Mires, MD  Multiple Vitamin (MULTIVITAMIN WITH MINERALS) TABS tablet Take 1 tablet by mouth daily.    [provider]  Multiple Vitamins-Minerals (PRESERVISION AREDS PO) Take 1 tablet by mouth 2 (two) times daily.    [provider]  OXYGEN Inhale 4 L into the lungs continuous.    [provider]  polyethylene glycol (MIRALAX / GLYCOLAX) 17 g packet Take 17 g by mouth daily. 12/15/21   Florencia Reasons, MD  senna-docusate (SENOKOT-S) 8.6-50 MG tablet Take 1 tablet by mouth 2 (two) times daily. 12/14/21   Florencia Reasons, MD  sertraline (ZOLOFT) 50 MG tablet Take 1 tablet (50 mg total) by mouth daily. 03/21/22   Lauree Chandler, NP  sodium chloride (OCEAN) 0.65 % SOLN nasal spray Place 1 spray into both nostrils daily as  needed for congestion.    [provider]  SYMBICORT 160-4.5 MCG/ACT inhaler Inhale 2 puffs into the lungs 2 (two) times daily. 12/06/21   [provider]  torsemide (DEMADEX) 20 MG tablet Take 1 tablet (20 mg total) by mouth daily. 03/21/22   Lauree Chandler, NP  trimethoprim-polymyxin b (POLYTRIM) ophthalmic solution Place 1 drop into the right eye every 6 (six) hours. 2 days after each monthly eye injection. 02/09/19   [provider]      Allergies    Ace inhibitors    Review of Systems   Review of Systems  Skin:  Positive for wound.    Physical Exam Updated Vital Signs BP (!) 179/84   Pulse 60   Temp 98.1 F (36.7 C) (Oral)   Resp 18   Ht 5' 6"  (1.676 m)   Wt 70.3 kg   SpO2 95%   BMI 25.02 kg/m  Physical Exam Vitals and nursing note reviewed.  Constitutional:      General: He is not in acute distress.    Appearance: He is well-developed.  HENT:     Head: Normocephalic and atraumatic.  Eyes:     Conjunctiva/sclera: Conjunctivae normal.  Cardiovascular:     Rate and Rhythm: Normal rate and regular rhythm.     Heart sounds: No murmur heard. Pulmonary:     Effort: Pulmonary effort is normal. No respiratory distress.     Breath sounds: Normal breath sounds.  Abdominal:     Palpations: Abdomen is soft.     Tenderness: There is no abdominal tenderness.  Musculoskeletal:        General: No swelling.     Cervical back: Neck supple.  Skin:    General: Skin is warm and dry.     Capillary Refill: Capillary refill takes less than 2 seconds.     Comments: 5 cm laceration to forehead.  Irregular laceration noted to left hand at approximately the third MCP with very thin skin  Neurological:     Mental Status: He is alert.  Psychiatric:        Mood and Affect: Mood normal.     ED Results / Procedures / Treatments   Labs (all labs ordered are listed, but only abnormal results are displayed) Labs Reviewed - No data to  display  EKG None  Radiology CT Cervical Spine Wo Contrast  Addendum Date: 04/26/2022   ADDENDUM REPORT: 04/26/2022 13:39 ADDENDUM: Following should be added to the impression. Small to moderate bilateral pleural effusions are seen, more so on the right side. Electronically Signed   By: Elmer Picker M.D.   On: 04/26/2022 13:39   Result Date: 04/26/2022 CLINICAL DATA:  Trauma, fall EXAM: CT CERVICAL SPINE WITHOUT CONTRAST TECHNIQUE: Multidetector CT imaging of the cervical spine was performed without intravenous contrast.  Multiplanar CT image reconstructions were also generated. RADIATION DOSE REDUCTION: This exam was performed according to the departmental dose-optimization program which includes automated exposure control, adjustment of the mA and/or kV according to patient size and/or use of iterative reconstruction technique. COMPARISON:  None Available. FINDINGS: Alignment: Alignment of posterior margins of vertebral bodies is unremarkable. Skull base and vertebrae: No recent fracture is seen. 9 mm smooth marginated calcification in the soft tissues posterior to the spinous process of C5 vertebra suggests ligament calcification from previous injury. Soft tissues and spinal canal: Posterior bony spurs are causing extrinsic pressure over the ventral margin of thecal sac at multiple levels, more so at C5-C6 level with mild spinal stenosis. Disc levels: There is encroachment of neural foramina by bony spurs and facet hypertrophy at C3-C4 level, more so on the left side. There is encroachment of neural foramina by bony spurs at C5-C6, C6-C7 and C7-T1 levels. Upper chest: Small to moderate bilateral pleural effusions are seen. Other: Significant arterial calcifications are noted. IMPRESSION: No recent fracture is seen in cervical spine. Cervical spondylosis with encroachment of neural foramina at multiple levels as described in the body of the report. Electronically Signed: By: Elmer Picker  M.D. On: 04/26/2022 13:26   CT Head Wo Contrast  Result Date: 04/26/2022 CLINICAL DATA:  Trauma, fall EXAM: CT HEAD WITHOUT CONTRAST TECHNIQUE: Contiguous axial images were obtained from the base of the skull through the vertex without intravenous contrast. RADIATION DOSE REDUCTION: This exam was performed according to the departmental dose-optimization program which includes automated exposure control, adjustment of the mA and/or kV according to patient size and/or use of iterative reconstruction technique. COMPARISON:  None Available. FINDINGS: Brain: No acute intracranial findings are seen in noncontrast CT brain. There are no signs of bleeding within the cranium. Ventricles are not dilated. Cortical sulci are prominent. Small old lacunar infarct is seen in basal ganglia. Vascular: Unremarkable. Skull: No fracture is seen in calvarium. There is contusion/hematoma in the frontal scalp. Overlying dressing is noted in the frontal scalp. Sinuses/Orbits: There is mucosal thickening in ethmoid sinus. Other: None. IMPRESSION: No acute intracranial findings are seen. Atrophy. Small old lacunar infarcts are seen in basal ganglia. Chronic sinusitis. Electronically Signed   By: Elmer Picker M.D.   On: 04/26/2022 13:21    Procedures .Marland KitchenLaceration Repair  Date/Time: 04/26/2022 3:15 PM  Performed by: Dorothyann Peng, PA-C Authorized by: Dorothyann Peng, PA-C   Consent:    Consent obtained:  Verbal   Consent given by:  Patient   Risks discussed:  Infection, need for additional repair, pain, poor cosmetic result and poor wound healing   Alternatives discussed:  No treatment and delayed treatment Universal protocol:    Procedure explained and questions answered to patient or proxy's satisfaction: yes     Relevant documents present and verified: yes     Test results available: yes     Imaging studies available: yes     Required blood products, implants, devices, and special equipment available:  yes     Site/side marked: yes     Immediately prior to procedure, a time out was called: yes     Patient identity confirmed:  Verbally with patient Anesthesia:    Anesthesia method:  Local infiltration   Local anesthetic:  Lidocaine 2% WITH epi Laceration details:    Location:  Face   Face location:  Forehead   Length (cm):  2.5   Depth (mm):  3 Pre-procedure details:    Preparation:  Patient was prepped and draped in usual sterile fashion Exploration:    Hemostasis achieved with:  Epinephrine, tied off vessels and direct pressure   Imaging outcome: foreign body not noted     Wound exploration: wound explored through full range of motion     Contaminated: no   Treatment:    Area cleansed with:  Saline and Shur-Clens   Amount of cleaning:  Standard   Debridement:  None Skin repair:    Repair method:  Sutures   Suture size:  5-0   Suture material:  Prolene   Suture technique:  Figure eight   Number of sutures: 3 figure-of-eight, 1 simple. Approximation:    Approximation:  Close     Medications Ordered in ED Medications  lidocaine-EPINEPHrine (XYLOCAINE W/EPI) 2 %-1:200000 (PF) injection 20 mL (20 mLs Infiltration Given 04/26/22 1414)    ED Course/ Medical Decision Making/ A&P                           Medical Decision Making Amount and/or Complexity of Data Reviewed Radiology: ordered.  Risk Prescription drug management.   This patient presents to the ED for concern of fall with head laceration, this involves an extensive number of treatment options, and is a complaint that carries with it a high risk of complications and morbidity.  The differential diagnosis includes intracranial abnormality, fracture, dislocation, cervical spine injury, soft tissue injury, and others   Co morbidities that complicate the patient evaluation  History of Alzheimer's disease, history of hearing loss   Additional history obtained:  Additional history obtained from EMS External  records from outside source obtained and reviewed including notes from internal medicine documenting possible depression due to living in an assisted living facility, evaluation for CKD stage IV due to type 2 diabetes   Lab Tests:  I Ordered, and personally interpreted labs.  The pertinent results include:  N/A   Imaging Studies ordered:  I ordered imaging studies including CT head, CT cervical spine I independently visualized and interpreted imaging which showed  No recent fracture is seen in cervical spine. Cervical spondylosis  with encroachment of neural foramina at multiple levels as described  in the body of the report.   No acute intracranial findings are seen. Atrophy. Small old lacunar  infarcts are seen in basal ganglia.    Chronic sinusitis.   I agree with the radiologist interpretation    Problem List / ED Course / Critical interventions / Medication management   I ordered medication including lidocaine for local anesthetic Reevaluation of the patient after these medicines showed that the patient improved I have reviewed the patients home medicines and have made adjustments as needed   Social Determinants of Health:  Patient lives in an assisted living facility   Test / Admission - Considered:  The patient has no acute findings on his CT scans.  The fall sounds mechanical in nature.  There is no loss of consciousness and other mental status.  The forehead laceration was repaired.  Sutures as noted in the procedure note above.  The laceration was repaired with Steri-Strips and covered with a dressing.  There is no indication at this time for admission.  Patient may discharge back to his assisted living facility.  Patient to have sutures removed in approximately 1 week to 10 days        Final Clinical Impression(s) / ED Diagnoses Final diagnoses:  Fall, initial encounter  Laceration of forehead,  initial encounter  Laceration of left hand without foreign  body, initial encounter    Rx / DC Orders ED Discharge Orders     None         Ronny Bacon 04/26/22 [place], Ankit, MD 04/27/22 2252

## 2022-04-26 NOTE — ED Triage Notes (Addendum)
Via EMS from [facility] after mechanical fall. Pt tripped over a rug and fell, hitting his head on a doorknob on the way down. +lac to middle forehead, left knuckles, and bleeding in mouth, unsure if he bit his tongue on the way down. Pt is very hard of hearing. Denies additional injuries. No blood thinners. A/O x 4, GCS 15. Denies additional recent falls.

## 2022-04-26 NOTE — Discharge Instructions (Addendum)
You were seen today in the emergency department after a fall.  Noted to have a laceration to the forehead and to the left hand.  The forehead laceration was repaired with 4 sutures.  3 sutures were called "figure of 8 "sutures and one was a simple interrupted suture.  They should be removed by any healthcare provider in approximate 1 week to 10 days.  The skin on your left hand was too thin for repair at this time.  This was covered with Steri-Strips.  Please keep that wound dry and clean.  It will take some time for your skin to heal on its own for the left hand.  If you continue to experience falls I would recommend follow-up with your primary care provider.  If you develop any life-threatening symptoms such as chest pain, shortness of breath, altered level of consciousness, or slurred speech, I recommend returning to the emergency department

## 2022-04-29 DIAGNOSIS — S0181XA Laceration without foreign body of other part of head, initial encounter: Secondary | ICD-10-CM | POA: Diagnosis not present

## 2022-04-29 DIAGNOSIS — W19XXXA Unspecified fall, initial encounter: Secondary | ICD-10-CM | POA: Diagnosis not present

## 2022-04-29 DIAGNOSIS — S61412A Laceration without foreign body of left hand, initial encounter: Secondary | ICD-10-CM | POA: Diagnosis not present

## 2022-04-29 DIAGNOSIS — I130 Hypertensive heart and chronic kidney disease with heart failure and stage 1 through stage 4 chronic kidney disease, or unspecified chronic kidney disease: Secondary | ICD-10-CM | POA: Diagnosis not present

## 2022-04-29 DIAGNOSIS — E1122 Type 2 diabetes mellitus with diabetic chronic kidney disease: Secondary | ICD-10-CM | POA: Diagnosis not present

## 2022-04-29 DIAGNOSIS — I509 Heart failure, unspecified: Secondary | ICD-10-CM | POA: Diagnosis not present

## 2022-04-29 DIAGNOSIS — J449 Chronic obstructive pulmonary disease, unspecified: Secondary | ICD-10-CM | POA: Diagnosis not present

## 2022-04-29 DIAGNOSIS — K5904 Chronic idiopathic constipation: Secondary | ICD-10-CM | POA: Diagnosis not present

## 2022-04-29 DIAGNOSIS — E782 Mixed hyperlipidemia: Secondary | ICD-10-CM | POA: Diagnosis not present

## 2022-04-29 DIAGNOSIS — N184 Chronic kidney disease, stage 4 (severe): Secondary | ICD-10-CM | POA: Diagnosis not present

## 2022-05-05 DIAGNOSIS — Z79899 Other long term (current) drug therapy: Secondary | ICD-10-CM | POA: Diagnosis not present

## 2022-05-05 DIAGNOSIS — J9610 Chronic respiratory failure, unspecified whether with hypoxia or hypercapnia: Secondary | ICD-10-CM | POA: Diagnosis not present

## 2022-05-05 DIAGNOSIS — H9190 Unspecified hearing loss, unspecified ear: Secondary | ICD-10-CM | POA: Diagnosis not present

## 2022-05-05 DIAGNOSIS — J449 Chronic obstructive pulmonary disease, unspecified: Secondary | ICD-10-CM | POA: Diagnosis not present

## 2022-05-05 DIAGNOSIS — N184 Chronic kidney disease, stage 4 (severe): Secondary | ICD-10-CM | POA: Diagnosis not present

## 2022-05-05 DIAGNOSIS — M545 Low back pain, unspecified: Secondary | ICD-10-CM | POA: Diagnosis not present

## 2022-05-05 DIAGNOSIS — Z7982 Long term (current) use of aspirin: Secondary | ICD-10-CM | POA: Diagnosis not present

## 2022-05-05 DIAGNOSIS — Z87891 Personal history of nicotine dependence: Secondary | ICD-10-CM | POA: Diagnosis not present

## 2022-05-05 DIAGNOSIS — E1122 Type 2 diabetes mellitus with diabetic chronic kidney disease: Secondary | ICD-10-CM | POA: Diagnosis not present

## 2022-05-05 DIAGNOSIS — E785 Hyperlipidemia, unspecified: Secondary | ICD-10-CM | POA: Diagnosis not present

## 2022-05-05 DIAGNOSIS — F0280 Dementia in other diseases classified elsewhere without behavioral disturbance: Secondary | ICD-10-CM | POA: Diagnosis not present

## 2022-05-05 DIAGNOSIS — S61412D Laceration without foreign body of left hand, subsequent encounter: Secondary | ICD-10-CM | POA: Diagnosis not present

## 2022-05-05 DIAGNOSIS — Z7951 Long term (current) use of inhaled steroids: Secondary | ICD-10-CM | POA: Diagnosis not present

## 2022-05-05 DIAGNOSIS — W19XXXD Unspecified fall, subsequent encounter: Secondary | ICD-10-CM | POA: Diagnosis not present

## 2022-05-05 DIAGNOSIS — Z9981 Dependence on supplemental oxygen: Secondary | ICD-10-CM | POA: Diagnosis not present

## 2022-05-05 DIAGNOSIS — S0181XD Laceration without foreign body of other part of head, subsequent encounter: Secondary | ICD-10-CM | POA: Diagnosis not present

## 2022-05-05 DIAGNOSIS — J329 Chronic sinusitis, unspecified: Secondary | ICD-10-CM | POA: Diagnosis not present

## 2022-05-05 DIAGNOSIS — F329 Major depressive disorder, single episode, unspecified: Secondary | ICD-10-CM | POA: Diagnosis not present

## 2022-05-05 DIAGNOSIS — G309 Alzheimer's disease, unspecified: Secondary | ICD-10-CM | POA: Diagnosis not present

## 2022-05-05 DIAGNOSIS — I509 Heart failure, unspecified: Secondary | ICD-10-CM | POA: Diagnosis not present

## 2022-05-05 DIAGNOSIS — I130 Hypertensive heart and chronic kidney disease with heart failure and stage 1 through stage 4 chronic kidney disease, or unspecified chronic kidney disease: Secondary | ICD-10-CM | POA: Diagnosis not present

## 2022-05-09 DIAGNOSIS — N390 Urinary tract infection, site not specified: Secondary | ICD-10-CM | POA: Diagnosis not present

## 2022-05-13 DIAGNOSIS — I130 Hypertensive heart and chronic kidney disease with heart failure and stage 1 through stage 4 chronic kidney disease, or unspecified chronic kidney disease: Secondary | ICD-10-CM | POA: Diagnosis not present

## 2022-05-13 DIAGNOSIS — E1122 Type 2 diabetes mellitus with diabetic chronic kidney disease: Secondary | ICD-10-CM | POA: Diagnosis not present

## 2022-05-13 DIAGNOSIS — M1990 Unspecified osteoarthritis, unspecified site: Secondary | ICD-10-CM | POA: Diagnosis not present

## 2022-05-13 DIAGNOSIS — M5441 Lumbago with sciatica, right side: Secondary | ICD-10-CM | POA: Diagnosis not present

## 2022-05-13 DIAGNOSIS — N184 Chronic kidney disease, stage 4 (severe): Secondary | ICD-10-CM | POA: Diagnosis not present

## 2022-05-14 ENCOUNTER — Telehealth: Payer: Self-pay

## 2022-05-14 NOTE — Telephone Encounter (Signed)
Patient called to see if Ian [name] could send in medication to help him sleep. He states since moving into the [facility] home he have not had a full night rest.

## 2022-05-14 NOTE — Telephone Encounter (Signed)
Called patient and no answer left message to return call. 

## 2022-05-14 NOTE — Telephone Encounter (Signed)
He can try melatonin 1 mg by mouth at bedtime

## 2022-05-16 ENCOUNTER — Ambulatory Visit: Payer: PPO | Attending: Interventional Cardiology | Admitting: Interventional Cardiology

## 2022-05-16 MED ORDER — MELATONIN 1 MG PO CAPS: 1.0000 | ORAL_CAPSULE | Freq: Every day | ORAL | 0 refills | Status: DC

## 2022-05-16 NOTE — Telephone Encounter (Signed)
Discussed Ian [name]'s response with patients son Ian [name] who verbalized understanding of concern.

## 2022-05-16 NOTE — Addendum Note (Signed)
Addended by: Logan Bores on: 05/16/2022 02:18 PM   Modules accepted: Orders

## 2022-05-17 ENCOUNTER — Ambulatory Visit: Payer: PPO | Admitting: Nurse Practitioner

## 2022-05-19 ENCOUNTER — Encounter: Payer: Self-pay | Admitting: Nurse Practitioner

## 2022-05-19 DIAGNOSIS — F419 Anxiety disorder, unspecified: Secondary | ICD-10-CM

## 2022-05-21 MED ORDER — SERTRALINE HCL 100 MG PO TABS: 100.0000 mg | ORAL_TABLET | Freq: Every day | ORAL | 1 refills | Status: DC

## 2022-05-21 NOTE — Telephone Encounter (Signed)
LMOM to return call.

## 2022-05-21 NOTE — Telephone Encounter (Signed)
Please let facility know about update

## 2022-05-22 DIAGNOSIS — G309 Alzheimer's disease, unspecified: Secondary | ICD-10-CM | POA: Diagnosis not present

## 2022-05-22 DIAGNOSIS — E1122 Type 2 diabetes mellitus with diabetic chronic kidney disease: Secondary | ICD-10-CM | POA: Diagnosis not present

## 2022-05-22 DIAGNOSIS — Z7951 Long term (current) use of inhaled steroids: Secondary | ICD-10-CM | POA: Diagnosis not present

## 2022-05-22 DIAGNOSIS — I130 Hypertensive heart and chronic kidney disease with heart failure and stage 1 through stage 4 chronic kidney disease, or unspecified chronic kidney disease: Secondary | ICD-10-CM | POA: Diagnosis not present

## 2022-05-22 DIAGNOSIS — F0280 Dementia in other diseases classified elsewhere without behavioral disturbance: Secondary | ICD-10-CM | POA: Diagnosis not present

## 2022-05-22 DIAGNOSIS — F329 Major depressive disorder, single episode, unspecified: Secondary | ICD-10-CM | POA: Diagnosis not present

## 2022-05-22 DIAGNOSIS — S61412D Laceration without foreign body of left hand, subsequent encounter: Secondary | ICD-10-CM | POA: Diagnosis not present

## 2022-05-22 DIAGNOSIS — Z7982 Long term (current) use of aspirin: Secondary | ICD-10-CM | POA: Diagnosis not present

## 2022-05-22 DIAGNOSIS — E785 Hyperlipidemia, unspecified: Secondary | ICD-10-CM | POA: Diagnosis not present

## 2022-05-22 DIAGNOSIS — I509 Heart failure, unspecified: Secondary | ICD-10-CM | POA: Diagnosis not present

## 2022-05-22 DIAGNOSIS — J449 Chronic obstructive pulmonary disease, unspecified: Secondary | ICD-10-CM | POA: Diagnosis not present

## 2022-05-22 DIAGNOSIS — Z79899 Other long term (current) drug therapy: Secondary | ICD-10-CM | POA: Diagnosis not present

## 2022-05-22 DIAGNOSIS — J9610 Chronic respiratory failure, unspecified whether with hypoxia or hypercapnia: Secondary | ICD-10-CM | POA: Diagnosis not present

## 2022-05-22 DIAGNOSIS — H9190 Unspecified hearing loss, unspecified ear: Secondary | ICD-10-CM | POA: Diagnosis not present

## 2022-05-22 DIAGNOSIS — Z87891 Personal history of nicotine dependence: Secondary | ICD-10-CM | POA: Diagnosis not present

## 2022-05-22 DIAGNOSIS — J329 Chronic sinusitis, unspecified: Secondary | ICD-10-CM | POA: Diagnosis not present

## 2022-05-22 DIAGNOSIS — Z9981 Dependence on supplemental oxygen: Secondary | ICD-10-CM | POA: Diagnosis not present

## 2022-05-22 DIAGNOSIS — N184 Chronic kidney disease, stage 4 (severe): Secondary | ICD-10-CM | POA: Diagnosis not present

## 2022-05-22 DIAGNOSIS — M545 Low back pain, unspecified: Secondary | ICD-10-CM | POA: Diagnosis not present

## 2022-05-22 DIAGNOSIS — S0181XD Laceration without foreign body of other part of head, subsequent encounter: Secondary | ICD-10-CM | POA: Diagnosis not present

## 2022-05-22 DIAGNOSIS — W19XXXD Unspecified fall, subsequent encounter: Secondary | ICD-10-CM | POA: Diagnosis not present

## 2022-05-24 DIAGNOSIS — Z79899 Other long term (current) drug therapy: Secondary | ICD-10-CM | POA: Diagnosis not present

## 2022-05-27 DIAGNOSIS — Z87891 Personal history of nicotine dependence: Secondary | ICD-10-CM | POA: Diagnosis not present

## 2022-05-27 DIAGNOSIS — E785 Hyperlipidemia, unspecified: Secondary | ICD-10-CM | POA: Diagnosis not present

## 2022-05-27 DIAGNOSIS — J329 Chronic sinusitis, unspecified: Secondary | ICD-10-CM | POA: Diagnosis not present

## 2022-05-27 DIAGNOSIS — G309 Alzheimer's disease, unspecified: Secondary | ICD-10-CM | POA: Diagnosis not present

## 2022-05-27 DIAGNOSIS — J449 Chronic obstructive pulmonary disease, unspecified: Secondary | ICD-10-CM | POA: Diagnosis not present

## 2022-05-27 DIAGNOSIS — Z79899 Other long term (current) drug therapy: Secondary | ICD-10-CM | POA: Diagnosis not present

## 2022-05-27 DIAGNOSIS — Z9981 Dependence on supplemental oxygen: Secondary | ICD-10-CM | POA: Diagnosis not present

## 2022-05-27 DIAGNOSIS — M545 Low back pain, unspecified: Secondary | ICD-10-CM | POA: Diagnosis not present

## 2022-05-27 DIAGNOSIS — S61412D Laceration without foreign body of left hand, subsequent encounter: Secondary | ICD-10-CM | POA: Diagnosis not present

## 2022-05-27 DIAGNOSIS — Z7982 Long term (current) use of aspirin: Secondary | ICD-10-CM | POA: Diagnosis not present

## 2022-05-27 DIAGNOSIS — I509 Heart failure, unspecified: Secondary | ICD-10-CM | POA: Diagnosis not present

## 2022-05-27 DIAGNOSIS — H9190 Unspecified hearing loss, unspecified ear: Secondary | ICD-10-CM | POA: Diagnosis not present

## 2022-05-27 DIAGNOSIS — W19XXXD Unspecified fall, subsequent encounter: Secondary | ICD-10-CM | POA: Diagnosis not present

## 2022-05-27 DIAGNOSIS — N184 Chronic kidney disease, stage 4 (severe): Secondary | ICD-10-CM | POA: Diagnosis not present

## 2022-05-27 DIAGNOSIS — F329 Major depressive disorder, single episode, unspecified: Secondary | ICD-10-CM | POA: Diagnosis not present

## 2022-05-27 DIAGNOSIS — F0280 Dementia in other diseases classified elsewhere without behavioral disturbance: Secondary | ICD-10-CM | POA: Diagnosis not present

## 2022-05-27 DIAGNOSIS — J9610 Chronic respiratory failure, unspecified whether with hypoxia or hypercapnia: Secondary | ICD-10-CM | POA: Diagnosis not present

## 2022-05-27 DIAGNOSIS — S0181XD Laceration without foreign body of other part of head, subsequent encounter: Secondary | ICD-10-CM | POA: Diagnosis not present

## 2022-05-27 DIAGNOSIS — I130 Hypertensive heart and chronic kidney disease with heart failure and stage 1 through stage 4 chronic kidney disease, or unspecified chronic kidney disease: Secondary | ICD-10-CM | POA: Diagnosis not present

## 2022-05-27 DIAGNOSIS — Z7951 Long term (current) use of inhaled steroids: Secondary | ICD-10-CM | POA: Diagnosis not present

## 2022-05-27 DIAGNOSIS — E1122 Type 2 diabetes mellitus with diabetic chronic kidney disease: Secondary | ICD-10-CM | POA: Diagnosis not present

## 2022-05-28 DIAGNOSIS — Z9981 Dependence on supplemental oxygen: Secondary | ICD-10-CM | POA: Diagnosis not present

## 2022-05-28 DIAGNOSIS — J9610 Chronic respiratory failure, unspecified whether with hypoxia or hypercapnia: Secondary | ICD-10-CM | POA: Diagnosis not present

## 2022-05-28 DIAGNOSIS — H9190 Unspecified hearing loss, unspecified ear: Secondary | ICD-10-CM | POA: Diagnosis not present

## 2022-05-28 DIAGNOSIS — S61412D Laceration without foreign body of left hand, subsequent encounter: Secondary | ICD-10-CM | POA: Diagnosis not present

## 2022-05-28 DIAGNOSIS — I130 Hypertensive heart and chronic kidney disease with heart failure and stage 1 through stage 4 chronic kidney disease, or unspecified chronic kidney disease: Secondary | ICD-10-CM | POA: Diagnosis not present

## 2022-05-28 DIAGNOSIS — N184 Chronic kidney disease, stage 4 (severe): Secondary | ICD-10-CM | POA: Diagnosis not present

## 2022-05-28 DIAGNOSIS — J449 Chronic obstructive pulmonary disease, unspecified: Secondary | ICD-10-CM | POA: Diagnosis not present

## 2022-05-28 DIAGNOSIS — Z7951 Long term (current) use of inhaled steroids: Secondary | ICD-10-CM | POA: Diagnosis not present

## 2022-05-28 DIAGNOSIS — Z79899 Other long term (current) drug therapy: Secondary | ICD-10-CM | POA: Diagnosis not present

## 2022-05-28 DIAGNOSIS — M1990 Unspecified osteoarthritis, unspecified site: Secondary | ICD-10-CM | POA: Diagnosis not present

## 2022-05-28 DIAGNOSIS — I110 Hypertensive heart disease with heart failure: Secondary | ICD-10-CM | POA: Diagnosis not present

## 2022-05-28 DIAGNOSIS — Z87891 Personal history of nicotine dependence: Secondary | ICD-10-CM | POA: Diagnosis not present

## 2022-05-28 DIAGNOSIS — J329 Chronic sinusitis, unspecified: Secondary | ICD-10-CM | POA: Diagnosis not present

## 2022-05-28 DIAGNOSIS — M545 Low back pain, unspecified: Secondary | ICD-10-CM | POA: Diagnosis not present

## 2022-05-28 DIAGNOSIS — E785 Hyperlipidemia, unspecified: Secondary | ICD-10-CM | POA: Diagnosis not present

## 2022-05-28 DIAGNOSIS — Z7982 Long term (current) use of aspirin: Secondary | ICD-10-CM | POA: Diagnosis not present

## 2022-05-28 DIAGNOSIS — S0181XD Laceration without foreign body of other part of head, subsequent encounter: Secondary | ICD-10-CM | POA: Diagnosis not present

## 2022-05-28 DIAGNOSIS — I509 Heart failure, unspecified: Secondary | ICD-10-CM | POA: Diagnosis not present

## 2022-05-28 DIAGNOSIS — F329 Major depressive disorder, single episode, unspecified: Secondary | ICD-10-CM | POA: Diagnosis not present

## 2022-05-28 DIAGNOSIS — E1122 Type 2 diabetes mellitus with diabetic chronic kidney disease: Secondary | ICD-10-CM | POA: Diagnosis not present

## 2022-05-28 DIAGNOSIS — G309 Alzheimer's disease, unspecified: Secondary | ICD-10-CM | POA: Diagnosis not present

## 2022-05-28 DIAGNOSIS — F0280 Dementia in other diseases classified elsewhere without behavioral disturbance: Secondary | ICD-10-CM | POA: Diagnosis not present

## 2022-05-28 DIAGNOSIS — W19XXXD Unspecified fall, subsequent encounter: Secondary | ICD-10-CM | POA: Diagnosis not present

## 2022-05-29 DIAGNOSIS — Z87891 Personal history of nicotine dependence: Secondary | ICD-10-CM | POA: Diagnosis not present

## 2022-05-29 DIAGNOSIS — M545 Low back pain, unspecified: Secondary | ICD-10-CM | POA: Diagnosis not present

## 2022-05-29 DIAGNOSIS — J329 Chronic sinusitis, unspecified: Secondary | ICD-10-CM | POA: Diagnosis not present

## 2022-05-29 DIAGNOSIS — J449 Chronic obstructive pulmonary disease, unspecified: Secondary | ICD-10-CM | POA: Diagnosis not present

## 2022-05-29 DIAGNOSIS — F329 Major depressive disorder, single episode, unspecified: Secondary | ICD-10-CM | POA: Diagnosis not present

## 2022-05-29 DIAGNOSIS — S61412D Laceration without foreign body of left hand, subsequent encounter: Secondary | ICD-10-CM | POA: Diagnosis not present

## 2022-05-29 DIAGNOSIS — F0280 Dementia in other diseases classified elsewhere without behavioral disturbance: Secondary | ICD-10-CM | POA: Diagnosis not present

## 2022-05-29 DIAGNOSIS — J9610 Chronic respiratory failure, unspecified whether with hypoxia or hypercapnia: Secondary | ICD-10-CM | POA: Diagnosis not present

## 2022-05-29 DIAGNOSIS — Z79899 Other long term (current) drug therapy: Secondary | ICD-10-CM | POA: Diagnosis not present

## 2022-05-29 DIAGNOSIS — E1122 Type 2 diabetes mellitus with diabetic chronic kidney disease: Secondary | ICD-10-CM | POA: Diagnosis not present

## 2022-05-29 DIAGNOSIS — E785 Hyperlipidemia, unspecified: Secondary | ICD-10-CM | POA: Diagnosis not present

## 2022-05-29 DIAGNOSIS — Z9981 Dependence on supplemental oxygen: Secondary | ICD-10-CM | POA: Diagnosis not present

## 2022-05-29 DIAGNOSIS — Z7951 Long term (current) use of inhaled steroids: Secondary | ICD-10-CM | POA: Diagnosis not present

## 2022-05-29 DIAGNOSIS — S0181XD Laceration without foreign body of other part of head, subsequent encounter: Secondary | ICD-10-CM | POA: Diagnosis not present

## 2022-05-29 DIAGNOSIS — W19XXXD Unspecified fall, subsequent encounter: Secondary | ICD-10-CM | POA: Diagnosis not present

## 2022-05-29 DIAGNOSIS — I509 Heart failure, unspecified: Secondary | ICD-10-CM | POA: Diagnosis not present

## 2022-05-29 DIAGNOSIS — G309 Alzheimer's disease, unspecified: Secondary | ICD-10-CM | POA: Diagnosis not present

## 2022-05-29 DIAGNOSIS — I130 Hypertensive heart and chronic kidney disease with heart failure and stage 1 through stage 4 chronic kidney disease, or unspecified chronic kidney disease: Secondary | ICD-10-CM | POA: Diagnosis not present

## 2022-05-29 DIAGNOSIS — H9190 Unspecified hearing loss, unspecified ear: Secondary | ICD-10-CM | POA: Diagnosis not present

## 2022-05-29 DIAGNOSIS — Z7982 Long term (current) use of aspirin: Secondary | ICD-10-CM | POA: Diagnosis not present

## 2022-05-29 DIAGNOSIS — N184 Chronic kidney disease, stage 4 (severe): Secondary | ICD-10-CM | POA: Diagnosis not present

## 2022-06-12 DIAGNOSIS — M5441 Lumbago with sciatica, right side: Secondary | ICD-10-CM | POA: Diagnosis not present

## 2022-06-17 ENCOUNTER — Encounter: Payer: PPO | Admitting: Nurse Practitioner

## 2022-06-17 DIAGNOSIS — I110 Hypertensive heart disease with heart failure: Secondary | ICD-10-CM | POA: Diagnosis not present

## 2022-06-17 DIAGNOSIS — D631 Anemia in chronic kidney disease: Secondary | ICD-10-CM | POA: Diagnosis not present

## 2022-06-17 DIAGNOSIS — J449 Chronic obstructive pulmonary disease, unspecified: Secondary | ICD-10-CM | POA: Diagnosis not present

## 2022-06-18 NOTE — Progress Notes (Signed)
This encounter was created in error - please disregard.

## 2022-06-19 DIAGNOSIS — I110 Hypertensive heart disease with heart failure: Secondary | ICD-10-CM | POA: Diagnosis not present

## 2022-06-19 DIAGNOSIS — M1990 Unspecified osteoarthritis, unspecified site: Secondary | ICD-10-CM | POA: Diagnosis not present

## 2022-06-20 ENCOUNTER — Non-Acute Institutional Stay: Payer: PPO | Admitting: Family Medicine

## 2022-06-20 ENCOUNTER — Encounter: Payer: Self-pay | Admitting: Family Medicine

## 2022-06-20 VITALS — BP 188/94 | HR 74 | Temp 100.0°F | Resp 20

## 2022-06-20 DIAGNOSIS — R509 Fever, unspecified: Secondary | ICD-10-CM | POA: Diagnosis not present

## 2022-06-20 DIAGNOSIS — R636 Underweight: Secondary | ICD-10-CM | POA: Diagnosis not present

## 2022-06-20 DIAGNOSIS — I152 Hypertension secondary to endocrine disorders: Secondary | ICD-10-CM | POA: Diagnosis not present

## 2022-06-20 DIAGNOSIS — E1159 Type 2 diabetes mellitus with other circulatory complications: Secondary | ICD-10-CM

## 2022-06-20 DIAGNOSIS — I110 Hypertensive heart disease with heart failure: Secondary | ICD-10-CM | POA: Diagnosis not present

## 2022-06-20 NOTE — Progress Notes (Signed)
[facility] Consult Note Telephone: [phone]  Fax: [phone]    Date of encounter: 06/20/22 10:45 AM PATIENT NAME: Ian [name] [address] [place] [address]   [phone] (home)  DOB: Jul 08, 1936 MRN: [id number] PRIMARY CARE PROVIDER:    Lauree Chandler, NP,  [place] [address] [phone]  REFERRING PROVIDER:   Lauree Chandler, NP [place],  [place] [address] [phone]  RESPONSIBLE PARTY:    Contact Information     Name Relation Home Work Mobile   Ian [name]   [phone]   Lexa Son   [phone]        I met face to face with patient in [facility]. Palliative Care was asked to follow this patient by consultation request of  Ian Chandler, NP to address advance care planning and complex medical decision making. This is a follow up visit   ASSESSMENT , SYMPTOM MANAGEMENT AND PLAN / RECOMMENDATIONS:   Fever, unspecified fever cause Recommend follow up with PCP, possible UA. Rapid Covid PCR negative at facility Tylenol 650 mg po Q 6 hrs prn temp > 100 degrees.   2.  Underweight due to inadequate caloric intake Add house protein supplement to increase and maintain weight.  3.  Hypertension Not well controlled today but staff states he is not normally this elevated. Given CKD stage 4 is not a candidate for ARB and has allergy to ACE-I.  Continues on Hydralazine 25 mg TID but may need to increase dose, follow up with son. Continue Metoprolol 50 mg BID and torsemide as directed.   Advance Care Planning/Goals of Care: Goals include to maximize quality of life and symptom management.  Identification of a healthcare agent-son Ian [name] Review and updating or creation of an advance directive document -Recreated DNR and scanned into Epic Decision not to resuscitate or to de-escalate disease focused treatments due to poor prognosis. CODE  STATUS: DNR/Do not intubate      Follow up Palliative Care Visit: Palliative care will continue to follow for complex medical decision making, advance care planning, and clarification of goals. Return 2 weeks or prn.    This visit was coded based on medical decision making (MDM).  PPS: 50%  HOSPICE ELIGIBILITY/DIAGNOSIS: TBD  Chief Complaint:  Palliative Care is continuing to follow patient for chronic medical management in setting of dementia.   HISTORY OF PRESENT ILLNESS:  Ian [name] is a 86 y.o. year old male with dementia and COPD. He also has HTN, branch retinal vein occlusion, chronic diastolic CHF, DM type 2, HLD and chronic right sided low back pain with right side sciatica.  Six months prior he had CAP and pleural effusion and CKD stage 4.  States he doesn't like the food here.  He repetitively states he feels fine.  Denies cough, congestion, nausea, dysuria or urinary frequency. He states "I'm fine.  I'm in good health.  I'll just have them give me a Tylenol."  Spoke with health and wellness director and asked about any Covid exposure in the building and she states she will have resident tested.  Pt remembered that he had seen this provider in the facility yesterday. Facility staff notes he has not had any recent complaints.  He has had an episode about a month ago where he was not making it to the bathroom at night and having wet the bed.  He is continent of bowel.  He is bathing and  dressing himself independently.  He is chronically using wc for mobility and does not ambulate.  Blood sugars are stable in the 120s/130s range. Facility performed Covid test which was negative today.    History obtained from review of EMR, discussion with facility staff and/or Ian [name].   04/26/22 CT head and cervical spine without contrast after a fall: IMPRESSION: No acute intracranial findings are seen. Atrophy. Small old lacunar infarcts are seen in basal ganglia.   Chronic  sinusitis.  IMPRESSION: No recent fracture is seen in cervical spine. Cervical spondylosis with encroachment of neural foramina at multiple levels as described in the body of the report. ADDENDUM REPORT: 04/26/2022 13:39   ADDENDUM: Following should be added to the impression.   Small to moderate bilateral pleural effusions are seen, more so on the right side.      Latest Ref Rng & Units 04/02/2022   12:00 AM 03/18/2022   11:27 AM 12/26/2021   11:32 AM  CBC  WBC  6.6     7.0  7.3   Hemoglobin 13.5 - 17.5 8.7     8.9  9.3   Hematocrit 41 - 53 27     27.2  28.3   Platelets 150 - 400 K/uL 148     177  190      This result is from an external source.      Latest Ref Rng & Units 03/18/2022   11:27 AM 12/26/2021   11:32 AM 12/13/2021    4:32 AM  CMP  Glucose 65 - 139 mg/dL 117  112  123   BUN 7 - 25 mg/dL 44  49  45   Creatinine 0.70 - 1.22 mg/dL 3.25  3.07  2.78   Sodium 135 - 146 mmol/L 146  142  140   Potassium 3.5 - 5.3 mmol/L 5.0  4.7  4.3   Chloride 98 - 110 mmol/L 113  111  108   CO2 20 - 32 mmol/L _0 Calcium 8.6 - 10.3 mg/dL 9.8  9.1  10.1   Total Protein 6.1 - 8.1 g/dL 5.8  5.6    Total Bilirubin 0.2 - 1.2 mg/dL 0.3  0.3    AST 10 - 35 U/L 15  17    ALT 9 - 46 U/L 10  18         I reviewed EMR for available labs, medications, imaging, studies and related documents. Records reviewed and summarized above.   ROS General: NAD ENMT: denies congestion Cardiovascular: denies chest pain, denies DOE Pulmonary: denies cough, denies SOB Abdomen: endorses fair appetite, GU: denies dysuria MSK:  denies increased weakness, no falls reported Skin: denies rashes or wounds Neurological: denies pain Psych: Endorses positive mood Heme/lymph/immuno: denies bruises, abnormal bleeding  Physical Exam: Current and past weights: November weight 152, 06/18/22 weight 139 lbs Constitutional: NAD, non-toxic in appearance General: thin, underweight EYES, lids intact, no  discharge  ENMT: hard of hearing even with hearing aids in place CV: S1S2, RRR with early systolic LL border murmur, no LE edema Pulmonary: CTAB, no increased work of breathing, no cough, room air Abdomen: normo-active BS + 4 quadrants, soft and non tender, no ascites GU: deferred MSK: no sarcopenia, moves all extremities, seated in walker Skin: warm and dry, no rashes or wounds on visible skin Neuro:  no generalized weakness, mild cognitive impairment Psych: non-anxious affect, A and O x 2 Hem/lymph/immuno: no widespread bruising   Thank you for the  opportunity to participate in the care of Mr. Shenker.  The palliative care team will continue to follow. Please call our office at [phone] if we can be of additional assistance.   Marijo Conception, FNP -C  COVID-19 PATIENT SCREENING TOOL Asked and negative response unless otherwise noted:   Have you had symptoms of covid, tested positive or been in contact with someone with symptoms/positive test in the past 5-10 days?  no

## 2022-06-24 ENCOUNTER — Encounter: Payer: Self-pay | Admitting: Nurse Practitioner

## 2022-06-24 ENCOUNTER — Ambulatory Visit (INDEPENDENT_AMBULATORY_CARE_PROVIDER_SITE_OTHER): Payer: PPO | Admitting: Nurse Practitioner

## 2022-06-24 VITALS — BP 132/70 | HR 66 | Temp 97.9°F | Ht 66.0 in | Wt 148.0 lb

## 2022-06-24 DIAGNOSIS — I5032 Chronic diastolic (congestive) heart failure: Secondary | ICD-10-CM

## 2022-06-24 DIAGNOSIS — I10 Essential (primary) hypertension: Secondary | ICD-10-CM

## 2022-06-24 DIAGNOSIS — N184 Chronic kidney disease, stage 4 (severe): Secondary | ICD-10-CM

## 2022-06-24 DIAGNOSIS — E1122 Type 2 diabetes mellitus with diabetic chronic kidney disease: Secondary | ICD-10-CM

## 2022-06-24 DIAGNOSIS — Z66 Do not resuscitate: Secondary | ICD-10-CM | POA: Diagnosis not present

## 2022-06-24 DIAGNOSIS — J4489 Other specified chronic obstructive pulmonary disease: Secondary | ICD-10-CM

## 2022-06-24 DIAGNOSIS — F3341 Major depressive disorder, recurrent, in partial remission: Secondary | ICD-10-CM | POA: Diagnosis not present

## 2022-06-24 DIAGNOSIS — R634 Abnormal weight loss: Secondary | ICD-10-CM | POA: Diagnosis not present

## 2022-06-24 DIAGNOSIS — F01A4 Vascular dementia, mild, with anxiety: Secondary | ICD-10-CM

## 2022-06-24 MED ORDER — MIRTAZAPINE 7.5 MG PO TABS: 7.5000 mg | ORAL_TABLET | Freq: Every day | ORAL | 3 refills | Status: DC

## 2022-06-24 MED ORDER — MIRTAZAPINE 7.5 MG PO TABS: 7.5000 mg | ORAL_TABLET | Freq: Every day | ORAL | 3 refills | Status: AC

## 2022-06-24 NOTE — Patient Instructions (Addendum)
To start remeron 7.5 mg by mouth at bedtime for mood/appetite  Make sure he is taking senna twice daily and miralax daily scheduled.   Make sure to drink plenty of water

## 2022-06-24 NOTE — Progress Notes (Unsigned)
Careteam: Patient Care Team: Lauree Chandler, NP as PCP - General (Geriatric Medicine) Zebedee Iba., MD as Referring Physician (Ophthalmology) Chesley Mires, MD as Consulting Physician (Pulmonary Disease) Lavonna Monarch, MD (Inactive) as Consulting Physician (Dermatology) Ladell Pier, MD as Consulting Physician (Oncology)  PLACE OF SERVICE:  [place] Directive information Does Patient Have a Medical Advance Directive?: Yes, Type of Advance Directive: [place];Living will;Out of facility DNR (pink MOST or yellow form), Pre-existing out of facility DNR order (yellow form or pink MOST form): Yellow form placed in chart (order not valid for inpatient use), Does patient want to make changes to medical advance directive?: No - Patient declined  Allergies  Allergen Reactions   Ace Inhibitors Cough    Chief Complaint  Patient presents with   Medical Management of Chronic Issues    4 month follow-up. Discuss need for shingrix, pneumonia vaccine, diabetic kidney evaluation, td/tdap, AWV, eye exam, flu vaccine, and additional covid boosters or post pone if patient refuses.      HPI: Patient is a 86 y.o. male for routine follow up   Reports appetite has decreased and not eating well due to the food not being good.  Reports his stomach hurts but denies GERD, nausea or vomiting. States he is hungry but does not eat due to the food.  Reports his nerves is doing pretty good at this time.   Reports he sleeps well. States his mood has been better.   COPD- not needing O2- portable machine is broken   Constipation- reports he strains a little when having a BM   Review of Systems:  ROS***  Past Medical History:  Diagnosis Date   Allergy    Alzheimer's disease ([place])    Anemia, unspecified    Anxiety    Chronic maxillary sinusitis    CKD (chronic kidney disease)    3b/4   COPD (chronic obstructive pulmonary disease) (HCC)    Depression     Diabetes mellitus    Edema    Hypercalcemia    Hyperlipidemia    Hypertension    Hypertrophy of prostate without urinary obstruction and other lower urinary tract symptoms (LUTS)    Hypertrophy of prostate without urinary obstruction and other lower urinary tract symptoms (LUTS)    Kidney disease    mild   SBO (small bowel obstruction) ([place]) 12/31/2016   Severe sepsis ([place]) 12/07/2021   Unspecified disorder of kidney and ureter    Unspecified sleep apnea    Ventral hernia, unspecified, without mention of obstruction or gangrene    Past Surgical History:  Procedure Laterality Date   EXPLORATORY LAPAROTOMY  2002   HERNIA REPAIR     Social History:   reports that he quit smoking about 33 years ago. His smoking use included cigarettes. He has a 45.00 pack-year smoking history. He has been exposed to tobacco smoke. He has never used smokeless tobacco. He reports that he does not drink alcohol and does not use drugs.  Family History  Problem Relation Age of Onset   Pancreatic cancer Sister    Heart disease Father    Hypertension Brother    Hypertension Sister     Medications: Patient's Medications  New Prescriptions   MIRTAZAPINE (REMERON) 7.5 MG TABLET    Take 1 tablet (7.5 mg total) by mouth at bedtime.  Previous Medications   ACETAMINOPHEN (TYLENOL) 325 MG TABLET    Take 650 mg by mouth every 6 (six) hours  as needed.   ACETAMINOPHEN (TYLENOL) 500 MG TABLET    Take 500 mg by mouth 2 (two) times daily.   ALBUTEROL (VENTOLIN HFA) 108 (90 BASE) MCG/ACT INHALER    Inhale 2 puffs into the lungs every 6 (six) hours as needed for wheezing or shortness of breath.   ASPIRIN EC (ASPIRIN LOW DOSE) 81 MG TABLET    TAKE 1 TABLET BY MOUTH EVERY DAY --SWALLOW WHOLE   ATORVASTATIN (LIPITOR) 80 MG TABLET    TAKE 1 TABLET BY MOUTH EVERY DAY   BESIVANCE 0.6 % SUSP    Place 1 drop into the right eye 2 (two) times daily.   BLOOD GLUCOSE METER KIT AND SUPPLIES KIT    Inject 1 each into the skin 2 (two)  times daily. Dispense based on patient and insurance preference. Use up to four times daily as directed. (FOR ICD-9 250.00, 250.01).   FLUTICASONE (FLONASE) 50 MCG/ACT NASAL SPRAY    SPRAY 1 SPRAY INTO BOTH NOSTRILS DAILY.   GLUCOSE BLOOD (ONE TOUCH ULTRA TEST) TEST STRIP    Use to test blood sugar twice daily.  DX: E11.9   GUAIFENESIN (MUCINEX) 600 MG 12 HR TABLET    Take 2 tablets (1,200 mg total) by mouth 2 (two) times daily as needed for cough or to loosen phlegm.   HYDRALAZINE (APRESOLINE) 25 MG TABLET    Take 25 mg by mouth 3 (three) times daily.   IPRATROPIUM-ALBUTEROL (DUONEB) 0.5-2.5 (3) MG/3ML SOLN    Take 3 mLs by nebulization every 6 (six) hours as needed (wheezing).   LANCET DEVICES (ONE TOUCH DELICA LANCING DEV) MISC    Use to test blood sugar once daily dx: E119   LORATADINE (CLARITIN) 10 MG TABLET    Take 1 tablet (10 mg total) by mouth daily.   MELATONIN 1 MG CAPS    Take 1 capsule (1 mg total) by mouth daily.   METOPROLOL TARTRATE (LOPRESSOR) 50 MG TABLET    TAKE 1 TABLET BY MOUTH TWICE A DAY   MONTELUKAST (SINGULAIR) 10 MG TABLET    TAKE 1 TABLET BY MOUTH EVERYDAY AT BEDTIME   MULTIPLE VITAMIN (MULTIVITAMIN WITH MINERALS) TABS TABLET    Take 1 tablet by mouth daily.   MULTIPLE VITAMINS-MINERALS (PRESERVISION AREDS PO)    Take 1 tablet by mouth 2 (two) times daily.   OXYGEN    Inhale 4 L into the lungs continuous.   POLYETHYLENE GLYCOL (MIRALAX / GLYCOLAX) 17 G PACKET    Take 17 g by mouth daily.   SENNA-DOCUSATE (SENOKOT-S) 8.6-50 MG TABLET    Take 1 tablet by mouth 2 (two) times daily.   SERTRALINE (ZOLOFT) 50 MG TABLET    Take 50 mg by mouth daily.   SODIUM CHLORIDE (OCEAN) 0.65 % SOLN NASAL SPRAY    Place 1 spray into both nostrils daily as needed for congestion.   SYMBICORT 160-4.5 MCG/ACT INHALER    Inhale 2 puffs into the lungs 2 (two) times daily.   TORSEMIDE (DEMADEX) 20 MG TABLET    Take 1 tablet (20 mg total) by mouth daily.   TRIMETHOPRIM-POLYMYXIN B (POLYTRIM)  OPHTHALMIC SOLUTION    Place 1 drop into the right eye every 6 (six) hours. 2 days after each monthly eye injection.   VITAMIN D, ERGOCALCIFEROL, (DRISDOL) 1.25 MG (50000 UNIT) CAPS CAPSULE    Take 50,000 Units by mouth every 7 (seven) days. Every Wed  Modified Medications   No medications on file  Discontinued Medications   BISACODYL  5 MG EC TABLET    Take 1 tablet (5 mg total) by mouth daily as needed for moderate constipation.   HYDRALAZINE (APRESOLINE) 25 MG TABLET    Take 1 tablet (25 mg total) by mouth 2 (two) times daily.   SERTRALINE (ZOLOFT) 100 MG TABLET    Take 1 tablet (100 mg total) by mouth daily.    Physical Exam:  Vitals:   06/24/22 1102  BP: 132/70  Pulse: 66  Temp: 97.9 F (36.6 C)  TempSrc: Temporal  SpO2: 99%  Weight: 148 lb (67.1 kg)  Height: _0  (1.676 m)   Body mass index is 23.89 kg/m. Wt Readings from Last 3 Encounters:  06/24/22 148 lb (67.1 kg)  04/26/22 155 lb (70.3 kg)  04/19/22 159 lb (72.1 kg)    Physical Exam***  Labs reviewed: Basic Metabolic Panel: Recent Labs    12/07/21 2259 12/08/21 0507 12/09/21 0437 12/13/21 0432 12/26/21 1132 03/18/22 1127  NA  --  140   < > 140 142 146  K  --  4.1   < > 4.3 4.7 5.0  CL  --  114*   < > 108 111* 113*  CO2  --  20*   < > _1 GLUCOSE  --  127*   < > 123* 112 117  BUN  --  61*   < > 45* 49* 44*  CREATININE  --  3.17*   < > 2.78* 3.07* 3.25*  CALCIUM  --  9.6   < > 10.1 9.1 9.8  MG  --  2.3  --   --   --   --   PHOS  --  3.6  --   --   --   --   TSH 1.670  --   --   --   --   --    < > = values in this interval not displayed.   Liver Function Tests: Recent Labs    12/09/21 0437 12/10/21 0418 12/11/21 0436 12/26/21 1132 03/18/22 1127  AST _2 ALT _3 ALKPHOS 56 55 58  --   --   BILITOT 0.7 0.7 0.7 0.3 0.3  PROT 6.0* 5.9* 6.3* 5.6* 5.8*  ALBUMIN 2.8* 2.7* 2.9*  --   --    No results for input(s): "LIPASE", "AMYLASE" in the last 8760 hours. No  results for input(s): "AMMONIA" in the last 8760 hours. CBC: Recent Labs    12/08/21 0507 12/09/21 0437 12/11/21 0436 12/26/21 1132 03/18/22 1127 04/02/22 0000  WBC 10.9*   < > 9.7 7.3 7.0 6.6  NEUTROABS 7.4  --   --  3,971 4,340  --   HGB 10.0*   < > 10.6* 9.3* 8.9* 8.7*  HCT 31.4*   < > 31.8* 28.3* 27.2* 27*  MCV 103.6*   < > 99.7 99.6 98.9  --   PLT 149*   < > 167 190 177 148*   < > = values in this interval not displayed.   Lipid Panel: Recent Labs    01/11/22 1002  CHOL 143  HDL 43  LDLCALC 77  TRIG 132  CHOLHDL 3.3   TSH: Recent Labs    12/07/21 2259  TSH 1.670   A1C: Lab Results  Component Value Date   HGBA1C 5.5 01/11/2022     Assessment/Plan 1. Essential hypertension -Blood pressure well controlled, goal bp <140/90 Continue current medications and dietary modifications  follow metabolic panel  2. Chronic diastolic CHF (congestive heart failure) (HCC) -euvolemic today  3. CKD stage 4 due to type 2 diabetes mellitus (HCC) ***  4. Recurrent major depressive disorder, in partial remission (HCC) ***  5. COPD with asthma ***  6. Type 2 diabetes mellitus with stage 4 chronic kidney disease, without long-term current use of insulin (HCC) ***  7. Weight loss *** - mirtazapine (REMERON) 7.5 MG tablet; Take 1 tablet (7.5 mg total) by mouth at bedtime.  Dispense: 30 tablet; Refill: 3  8. DNR (do not resuscitate) *** - Do not attempt resuscitation (DNR)  9. Primary hypertension ***  10. Chronic kidney disease, stage 4 (severe) (HCC) ***  11. Mild vascular dementia with anxiety (HCC) ***   No follow-ups on file.: *** Mahlik Lenn K. [place], [facility] [phone]

## 2022-06-28 DIAGNOSIS — R509 Fever, unspecified: Secondary | ICD-10-CM | POA: Insufficient documentation

## 2022-06-28 DIAGNOSIS — R636 Underweight: Secondary | ICD-10-CM | POA: Insufficient documentation

## 2022-07-11 ENCOUNTER — Encounter: Payer: Self-pay | Admitting: Gastroenterology

## 2022-07-13 DIAGNOSIS — M5441 Lumbago with sciatica, right side: Secondary | ICD-10-CM | POA: Diagnosis not present

## 2022-07-21 ENCOUNTER — Inpatient Hospital Stay (HOSPITAL_COMMUNITY)
Admission: EM | Admit: 2022-07-21 | Discharge: 2022-07-25 | DRG: 291 | Disposition: A | Discharge status: Hospice | Payer: PPO | Source: Skilled Nursing Facility | Attending: Internal Medicine | Admitting: Internal Medicine

## 2022-07-21 ENCOUNTER — Encounter (HOSPITAL_COMMUNITY): Payer: Self-pay | Admitting: Internal Medicine

## 2022-07-21 ENCOUNTER — Emergency Department (HOSPITAL_COMMUNITY): Payer: PPO

## 2022-07-21 DIAGNOSIS — F419 Anxiety disorder, unspecified: Secondary | ICD-10-CM | POA: Diagnosis present

## 2022-07-21 DIAGNOSIS — Z1152 Encounter for screening for COVID-19: Secondary | ICD-10-CM

## 2022-07-21 DIAGNOSIS — R0902 Hypoxemia: Secondary | ICD-10-CM | POA: Diagnosis not present

## 2022-07-21 DIAGNOSIS — G309 Alzheimer's disease, unspecified: Secondary | ICD-10-CM | POA: Diagnosis present

## 2022-07-21 DIAGNOSIS — Z789 Other specified health status: Secondary | ICD-10-CM | POA: Diagnosis not present

## 2022-07-21 DIAGNOSIS — N179 Acute kidney failure, unspecified: Secondary | ICD-10-CM | POA: Diagnosis not present

## 2022-07-21 DIAGNOSIS — I5A Non-ischemic myocardial injury (non-traumatic): Secondary | ICD-10-CM | POA: Diagnosis present

## 2022-07-21 DIAGNOSIS — I5021 Acute systolic (congestive) heart failure: Secondary | ICD-10-CM | POA: Diagnosis not present

## 2022-07-21 DIAGNOSIS — Z7982 Long term (current) use of aspirin: Secondary | ICD-10-CM

## 2022-07-21 DIAGNOSIS — R0602 Shortness of breath: Secondary | ICD-10-CM | POA: Diagnosis not present

## 2022-07-21 DIAGNOSIS — D649 Anemia, unspecified: Secondary | ICD-10-CM | POA: Insufficient documentation

## 2022-07-21 DIAGNOSIS — Z515 Encounter for palliative care: Secondary | ICD-10-CM | POA: Diagnosis not present

## 2022-07-21 DIAGNOSIS — E440 Moderate protein-calorie malnutrition: Secondary | ICD-10-CM | POA: Diagnosis not present

## 2022-07-21 DIAGNOSIS — F05 Delirium due to known physiological condition: Secondary | ICD-10-CM | POA: Diagnosis not present

## 2022-07-21 DIAGNOSIS — G3184 Mild cognitive impairment, so stated: Secondary | ICD-10-CM | POA: Diagnosis present

## 2022-07-21 DIAGNOSIS — E1169 Type 2 diabetes mellitus with other specified complication: Secondary | ICD-10-CM | POA: Diagnosis present

## 2022-07-21 DIAGNOSIS — Z800 Family history of malignant neoplasm of digestive organs: Secondary | ICD-10-CM

## 2022-07-21 DIAGNOSIS — D631 Anemia in chronic kidney disease: Secondary | ICD-10-CM | POA: Diagnosis not present

## 2022-07-21 DIAGNOSIS — R7989 Other specified abnormal findings of blood chemistry: Secondary | ICD-10-CM

## 2022-07-21 DIAGNOSIS — I5033 Acute on chronic diastolic (congestive) heart failure: Secondary | ICD-10-CM | POA: Diagnosis not present

## 2022-07-21 DIAGNOSIS — F32A Depression, unspecified: Secondary | ICD-10-CM | POA: Diagnosis not present

## 2022-07-21 DIAGNOSIS — Z79899 Other long term (current) drug therapy: Secondary | ICD-10-CM

## 2022-07-21 DIAGNOSIS — N184 Chronic kidney disease, stage 4 (severe): Secondary | ICD-10-CM | POA: Diagnosis present

## 2022-07-21 DIAGNOSIS — Z711 Person with feared health complaint in whom no diagnosis is made: Secondary | ICD-10-CM | POA: Diagnosis not present

## 2022-07-21 DIAGNOSIS — I152 Hypertension secondary to endocrine disorders: Secondary | ICD-10-CM | POA: Diagnosis not present

## 2022-07-21 DIAGNOSIS — B952 Enterococcus as the cause of diseases classified elsewhere: Secondary | ICD-10-CM | POA: Diagnosis present

## 2022-07-21 DIAGNOSIS — I10 Essential (primary) hypertension: Secondary | ICD-10-CM | POA: Diagnosis present

## 2022-07-21 DIAGNOSIS — G4733 Obstructive sleep apnea (adult) (pediatric): Secondary | ICD-10-CM | POA: Diagnosis present

## 2022-07-21 DIAGNOSIS — Z7189 Other specified counseling: Secondary | ICD-10-CM | POA: Diagnosis not present

## 2022-07-21 DIAGNOSIS — Z66 Do not resuscitate: Secondary | ICD-10-CM | POA: Diagnosis present

## 2022-07-21 DIAGNOSIS — R569 Unspecified convulsions: Secondary | ICD-10-CM | POA: Diagnosis not present

## 2022-07-21 DIAGNOSIS — F02811 Dementia in other diseases classified elsewhere, unspecified severity, with agitation: Secondary | ICD-10-CM | POA: Diagnosis not present

## 2022-07-21 DIAGNOSIS — D509 Iron deficiency anemia, unspecified: Secondary | ICD-10-CM | POA: Diagnosis not present

## 2022-07-21 DIAGNOSIS — N189 Chronic kidney disease, unspecified: Secondary | ICD-10-CM | POA: Diagnosis not present

## 2022-07-21 DIAGNOSIS — I2489 Other forms of acute ischemic heart disease: Secondary | ICD-10-CM | POA: Diagnosis present

## 2022-07-21 DIAGNOSIS — Z87891 Personal history of nicotine dependence: Secondary | ICD-10-CM

## 2022-07-21 DIAGNOSIS — N390 Urinary tract infection, site not specified: Secondary | ICD-10-CM | POA: Diagnosis present

## 2022-07-21 DIAGNOSIS — Z6824 Body mass index (BMI) 24.0-24.9, adult: Secondary | ICD-10-CM

## 2022-07-21 DIAGNOSIS — R001 Bradycardia, unspecified: Secondary | ICD-10-CM | POA: Diagnosis present

## 2022-07-21 DIAGNOSIS — I130 Hypertensive heart and chronic kidney disease with heart failure and stage 1 through stage 4 chronic kidney disease, or unspecified chronic kidney disease: Secondary | ICD-10-CM | POA: Diagnosis not present

## 2022-07-21 DIAGNOSIS — I110 Hypertensive heart disease with heart failure: Secondary | ICD-10-CM | POA: Diagnosis not present

## 2022-07-21 DIAGNOSIS — E119 Type 2 diabetes mellitus without complications: Secondary | ICD-10-CM

## 2022-07-21 DIAGNOSIS — E1122 Type 2 diabetes mellitus with diabetic chronic kidney disease: Secondary | ICD-10-CM | POA: Diagnosis not present

## 2022-07-21 DIAGNOSIS — J449 Chronic obstructive pulmonary disease, unspecified: Secondary | ICD-10-CM | POA: Diagnosis present

## 2022-07-21 DIAGNOSIS — I959 Hypotension, unspecified: Secondary | ICD-10-CM | POA: Diagnosis not present

## 2022-07-21 DIAGNOSIS — N400 Enlarged prostate without lower urinary tract symptoms: Secondary | ICD-10-CM | POA: Diagnosis present

## 2022-07-21 DIAGNOSIS — D696 Thrombocytopenia, unspecified: Secondary | ICD-10-CM | POA: Diagnosis not present

## 2022-07-21 DIAGNOSIS — E785 Hyperlipidemia, unspecified: Secondary | ICD-10-CM | POA: Diagnosis not present

## 2022-07-21 DIAGNOSIS — I21A1 Myocardial infarction type 2: Secondary | ICD-10-CM | POA: Diagnosis not present

## 2022-07-21 DIAGNOSIS — K5900 Constipation, unspecified: Secondary | ICD-10-CM | POA: Diagnosis not present

## 2022-07-21 DIAGNOSIS — Z888 Allergy status to other drugs, medicaments and biological substances status: Secondary | ICD-10-CM

## 2022-07-21 DIAGNOSIS — Z7951 Long term (current) use of inhaled steroids: Secondary | ICD-10-CM

## 2022-07-21 DIAGNOSIS — I509 Heart failure, unspecified: Principal | ICD-10-CM

## 2022-07-21 DIAGNOSIS — Z9981 Dependence on supplemental oxygen: Secondary | ICD-10-CM

## 2022-07-21 DIAGNOSIS — R778 Other specified abnormalities of plasma proteins: Secondary | ICD-10-CM | POA: Diagnosis not present

## 2022-07-21 DIAGNOSIS — Z8249 Family history of ischemic heart disease and other diseases of the circulatory system: Secondary | ICD-10-CM

## 2022-07-21 DIAGNOSIS — Z950 Presence of cardiac pacemaker: Secondary | ICD-10-CM

## 2022-07-21 DIAGNOSIS — J90 Pleural effusion, not elsewhere classified: Secondary | ICD-10-CM | POA: Diagnosis not present

## 2022-07-21 HISTORY — DX: Anxiety disorder, unspecified: F41.9

## 2022-07-21 HISTORY — DX: Depression, unspecified: F32.A

## 2022-07-21 LAB — CBC WITH DIFFERENTIAL/PLATELET
Abs Immature Granulocytes: 0 10*3/uL (ref 0.00–0.07)
Basophils Absolute: 0 10*3/uL (ref 0.0–0.1)
Basophils Relative: 0 %
Eosinophils Absolute: 0 10*3/uL (ref 0.0–0.5)
Eosinophils Relative: 0 %
HCT: 24.9 % — ABNORMAL LOW (ref 39.0–52.0)
Hemoglobin: 7.5 g/dL — ABNORMAL LOW (ref 13.0–17.0)
Lymphocytes Relative: 11 %
Lymphs Abs: 0.8 10*3/uL (ref 0.7–4.0)
MCH: 33.5 pg (ref 26.0–34.0)
MCHC: 30.1 g/dL (ref 30.0–36.0)
MCV: 111.2 fL — ABNORMAL HIGH (ref 80.0–100.0)
Monocytes Absolute: 0.1 10*3/uL (ref 0.1–1.0)
Monocytes Relative: 1 %
Neutro Abs: 6.3 10*3/uL (ref 1.7–7.7)
Neutrophils Relative %: 88 %
Platelets: 128 10*3/uL — ABNORMAL LOW (ref 150–400)
RBC: 2.24 MIL/uL — ABNORMAL LOW (ref 4.22–5.81)
RDW: 15.5 % (ref 11.5–15.5)
WBC: 7.2 10*3/uL (ref 4.0–10.5)
nRBC: 0 % (ref 0.0–0.2)
nRBC: 0 /100 WBC

## 2022-07-21 LAB — BRAIN NATRIURETIC PEPTIDE: B Natriuretic Peptide: >4500 pg/mL — ABNORMAL HIGH (ref 0.0–100.0)

## 2022-07-21 LAB — BASIC METABOLIC PANEL
Anion gap: 10 (ref 5–15)
BUN: 72 mg/dL — ABNORMAL HIGH (ref 8–23)
CO2: 20 mmol/L — ABNORMAL LOW (ref 22–32)
Calcium: 9.6 mg/dL (ref 8.9–10.3)
Chloride: 111 mmol/L (ref 98–111)
Creatinine, Ser: 3.58 mg/dL — ABNORMAL HIGH (ref 0.61–1.24)
GFR, Estimated: 16 mL/min — ABNORMAL LOW (ref >60)
Glucose, Bld: 118 mg/dL — ABNORMAL HIGH (ref 70–99)
Potassium: 4.6 mmol/L (ref 3.5–5.1)
Sodium: 141 mmol/L (ref 135–145)

## 2022-07-21 LAB — URINALYSIS, ROUTINE W REFLEX MICROSCOPIC
Bilirubin Urine: NEGATIVE
Glucose, UA: NEGATIVE mg/dL
Hgb urine dipstick: NEGATIVE
Ketones, ur: NEGATIVE mg/dL
Nitrite: NEGATIVE
Protein, ur: 100 mg/dL — AB
Specific Gravity, Urine: 1.01 (ref 1.005–1.030)
pH: 5 (ref 5.0–8.0)

## 2022-07-21 LAB — RESP PANEL BY RT-PCR (RSV, FLU A&B, COVID)  RVPGX2
Influenza A by PCR: NEGATIVE
Influenza B by PCR: NEGATIVE
Resp Syncytial Virus by PCR: NEGATIVE
SARS Coronavirus 2 by RT PCR: NEGATIVE

## 2022-07-21 LAB — CBG MONITORING, ED: Glucose-Capillary: 120 mg/dL — ABNORMAL HIGH (ref 70–99)

## 2022-07-21 LAB — TROPONIN I (HIGH SENSITIVITY)
Troponin I (High Sensitivity): 569 ng/L (ref <18)
Troponin I (High Sensitivity): 668 ng/L (ref <18)

## 2022-07-21 MED ORDER — GATIFLOXACIN 0.5 % OP SOLN
1.0000 [drp] | Freq: Two times a day (BID) | OPHTHALMIC | Status: DC
  Administered 2022-07-21 – 2022-07-25 (×6): 1 [drp] via OPHTHALMIC
  Filled 2022-07-21 (×2): qty 2.5

## 2022-07-21 MED ORDER — FUROSEMIDE 10 MG/ML IJ SOLN: 40.0000 mg | Freq: Two times a day (BID) | INTRAMUSCULAR | Status: DC

## 2022-07-21 MED ORDER — METOPROLOL TARTRATE 12.5 MG HALF TABLET: 12.5000 mg | ORAL_TABLET | Freq: Two times a day (BID) | ORAL | Status: DC

## 2022-07-21 MED ORDER — HYDRALAZINE HCL 20 MG/ML IJ SOLN: 5.0000 mg | INTRAMUSCULAR | Status: DC | PRN

## 2022-07-21 MED ORDER — ATORVASTATIN CALCIUM 80 MG PO TABS
80.0000 mg | ORAL_TABLET | Freq: Every evening | ORAL | Status: DC
  Administered 2022-07-21 – 2022-07-23 (×3): 80 mg via ORAL
  Filled 2022-07-21 (×3): qty 1

## 2022-07-21 MED ORDER — ASPIRIN 81 MG PO TBEC
81.0000 mg | DELAYED_RELEASE_TABLET | Freq: Every day | ORAL | Status: DC
  Administered 2022-07-22 – 2022-07-23 (×2): 81 mg via ORAL
  Filled 2022-07-21 (×2): qty 1

## 2022-07-21 MED ORDER — FUROSEMIDE 10 MG/ML IJ SOLN
80.0000 mg | Freq: Once | INTRAMUSCULAR | Status: AC
  Administered 2022-07-21: 80 mg via INTRAVENOUS
  Filled 2022-07-21: qty 8

## 2022-07-21 MED ORDER — MELATONIN 3 MG PO TABS
1.5000 mg | ORAL_TABLET | Freq: Every day | ORAL | Status: DC
  Administered 2022-07-21 – 2022-07-23 (×3): 1.5 mg via ORAL
  Filled 2022-07-21 (×3): qty 1

## 2022-07-21 MED ORDER — OXYCODONE HCL 5 MG PO TABS
5.0000 mg | ORAL_TABLET | ORAL | Status: DC | PRN
  Administered 2022-07-22 – 2022-07-24 (×6): 5 mg via ORAL
  Filled 2022-07-21 (×6): qty 1

## 2022-07-21 MED ORDER — ACETAMINOPHEN 650 MG RE SUPP: 650.0000 mg | Freq: Four times a day (QID) | RECTAL | Status: DC | PRN

## 2022-07-21 MED ORDER — ISOSORB DINITRATE-HYDRALAZINE 20-37.5 MG PO TABS
1.0000 | ORAL_TABLET | Freq: Three times a day (TID) | ORAL | Status: DC
  Administered 2022-07-21 – 2022-07-22 (×2): 1 via ORAL
  Filled 2022-07-21 (×2): qty 1

## 2022-07-21 MED ORDER — FLUTICASONE PROPIONATE 50 MCG/ACT NA SUSP
1.0000 | Freq: Every day | NASAL | Status: DC
  Administered 2022-07-23: 1 via NASAL
  Filled 2022-07-21 (×2): qty 16

## 2022-07-21 MED ORDER — ENOXAPARIN SODIUM 30 MG/0.3ML IJ SOSY
30.0000 mg | PREFILLED_SYRINGE | INTRAMUSCULAR | Status: DC
  Administered 2022-07-21 – 2022-07-23 (×3): 30 mg via SUBCUTANEOUS
  Filled 2022-07-21 (×3): qty 0.3

## 2022-07-21 MED ORDER — CARVEDILOL 3.125 MG PO TABS: 3.1250 mg | ORAL_TABLET | Freq: Two times a day (BID) | ORAL | Status: DC

## 2022-07-21 MED ORDER — BISACODYL 5 MG PO TBEC
5.0000 mg | DELAYED_RELEASE_TABLET | Freq: Every day | ORAL | Status: DC | PRN
  Administered 2022-07-22: 5 mg via ORAL
  Filled 2022-07-21: qty 1

## 2022-07-21 MED ORDER — MOMETASONE FURO-FORMOTEROL FUM 200-5 MCG/ACT IN AERO
2.0000 | INHALATION_SPRAY | Freq: Two times a day (BID) | RESPIRATORY_TRACT | Status: DC
  Administered 2022-07-21 – 2022-07-23 (×4): 2 via RESPIRATORY_TRACT
  Filled 2022-07-21 (×2): qty 8.8

## 2022-07-21 MED ORDER — DOCUSATE SODIUM 100 MG PO CAPS
100.0000 mg | ORAL_CAPSULE | Freq: Two times a day (BID) | ORAL | Status: DC
  Administered 2022-07-21 – 2022-07-23 (×5): 100 mg via ORAL
  Filled 2022-07-21 (×5): qty 1

## 2022-07-21 MED ORDER — SODIUM CHLORIDE 0.9% FLUSH
3.0000 mL | Freq: Two times a day (BID) | INTRAVENOUS | Status: DC
  Administered 2022-07-21 – 2022-07-23 (×5): 3 mL via INTRAVENOUS

## 2022-07-21 MED ORDER — CARVEDILOL 3.125 MG PO TABS
6.2500 mg | ORAL_TABLET | Freq: Two times a day (BID) | ORAL | Status: DC
  Administered 2022-07-21: 6.25 mg via ORAL
  Filled 2022-07-21: qty 2

## 2022-07-21 MED ORDER — MONTELUKAST SODIUM 10 MG PO TABS
10.0000 mg | ORAL_TABLET | Freq: Every day | ORAL | Status: DC
  Administered 2022-07-21 – 2022-07-23 (×3): 10 mg via ORAL
  Filled 2022-07-21 (×3): qty 1

## 2022-07-21 MED ORDER — ONDANSETRON HCL 4 MG PO TABS
4.0000 mg | ORAL_TABLET | Freq: Four times a day (QID) | ORAL | Status: DC | PRN
  Administered 2022-07-22: 4 mg via ORAL
  Filled 2022-07-21: qty 1

## 2022-07-21 MED ORDER — GUAIFENESIN ER 600 MG PO TB12: 1200.0000 mg | ORAL_TABLET | Freq: Two times a day (BID) | ORAL | Status: DC | PRN

## 2022-07-21 MED ORDER — HYDRALAZINE HCL 25 MG PO TABS
25.0000 mg | ORAL_TABLET | Freq: Three times a day (TID) | ORAL | Status: DC
  Administered 2022-07-21: 25 mg via ORAL
  Filled 2022-07-21: qty 1

## 2022-07-21 MED ORDER — INSULIN ASPART 100 UNIT/ML IJ SOLN
0.0000 [IU] | Freq: Three times a day (TID) | INTRAMUSCULAR | Status: DC
  Administered 2022-07-22 – 2022-07-23 (×2): 2 [IU] via SUBCUTANEOUS
  Administered 2022-07-23: 1 [IU] via SUBCUTANEOUS

## 2022-07-21 MED ORDER — POLYETHYLENE GLYCOL 3350 17 G PO PACK
17.0000 g | PACK | Freq: Every day | ORAL | Status: DC
  Administered 2022-07-22 – 2022-07-23 (×2): 17 g via ORAL
  Filled 2022-07-21 (×2): qty 1

## 2022-07-21 MED ORDER — POLYETHYLENE GLYCOL 3350 17 G PO PACK: 17.0000 g | PACK | Freq: Every day | ORAL | Status: DC | PRN

## 2022-07-21 MED ORDER — ACETAMINOPHEN 500 MG PO TABS
500.0000 mg | ORAL_TABLET | Freq: Two times a day (BID) | ORAL | Status: DC
  Administered 2022-07-21 – 2022-07-23 (×5): 500 mg via ORAL
  Filled 2022-07-21 (×6): qty 1

## 2022-07-21 MED ORDER — ONDANSETRON HCL 4 MG/2ML IJ SOLN: 4.0000 mg | Freq: Four times a day (QID) | INTRAMUSCULAR | Status: DC | PRN

## 2022-07-21 MED ORDER — ALBUTEROL SULFATE (2.5 MG/3ML) 0.083% IN NEBU: 2.5000 mg | INHALATION_SOLUTION | Freq: Four times a day (QID) | RESPIRATORY_TRACT | Status: DC | PRN

## 2022-07-21 MED ORDER — IPRATROPIUM-ALBUTEROL 0.5-2.5 (3) MG/3ML IN SOLN
3.0000 mL | Freq: Four times a day (QID) | RESPIRATORY_TRACT | Status: DC
  Administered 2022-07-21 – 2022-07-23 (×6): 3 mL via RESPIRATORY_TRACT
  Filled 2022-07-21 (×6): qty 3

## 2022-07-21 MED ORDER — LORATADINE 10 MG PO TABS
10.0000 mg | ORAL_TABLET | Freq: Every day | ORAL | Status: DC
  Administered 2022-07-22 – 2022-07-23 (×2): 10 mg via ORAL
  Filled 2022-07-21 (×2): qty 1

## 2022-07-21 MED ORDER — MIRTAZAPINE 15 MG PO TABS
7.5000 mg | ORAL_TABLET | Freq: Every day | ORAL | Status: DC
  Administered 2022-07-21 – 2022-07-23 (×3): 7.5 mg via ORAL
  Filled 2022-07-21 (×3): qty 1

## 2022-07-21 MED ORDER — ACETAMINOPHEN 325 MG PO TABS
650.0000 mg | ORAL_TABLET | Freq: Four times a day (QID) | ORAL | Status: DC | PRN
  Administered 2022-07-22 – 2022-07-25 (×3): 650 mg via ORAL
  Filled 2022-07-21 (×3): qty 2

## 2022-07-21 MED ORDER — SENNOSIDES-DOCUSATE SODIUM 8.6-50 MG PO TABS
1.0000 | ORAL_TABLET | Freq: Two times a day (BID) | ORAL | Status: DC
  Administered 2022-07-21 – 2022-07-23 (×5): 1 via ORAL
  Filled 2022-07-21 (×5): qty 1

## 2022-07-21 MED ORDER — SERTRALINE HCL 50 MG PO TABS
50.0000 mg | ORAL_TABLET | Freq: Every day | ORAL | Status: DC
  Administered 2022-07-22 – 2022-07-23 (×2): 50 mg via ORAL
  Filled 2022-07-21 (×2): qty 1

## 2022-07-21 MED ORDER — FUROSEMIDE 10 MG/ML IJ SOLN: 20.0000 mg | Freq: Two times a day (BID) | INTRAMUSCULAR | Status: DC

## 2022-07-21 MED ORDER — METOPROLOL TARTRATE 25 MG PO TABS: 50.0000 mg | ORAL_TABLET | Freq: Two times a day (BID) | ORAL | Status: DC

## 2022-07-21 MED ORDER — FUROSEMIDE 10 MG/ML IJ SOLN
20.0000 mg | Freq: Once | INTRAMUSCULAR | Status: AC
  Administered 2022-07-21: 20 mg via INTRAVENOUS
  Filled 2022-07-21: qty 2

## 2022-07-21 NOTE — ED Triage Notes (Signed)
Pt BIB [facility] from [facility] facility d/t hypoxia and brady. Pt was found by the staff member w sat in the 70s on his baseline 2L. Upon EMS arrival, pt was in the low 80s, was placed on NRB. Pt had hx of hypoxia episodes in the past. Pt was on 2L O2 enroute w EMS w O2 maintained in the 90s. Pt's HR was also in the 50s. Hx CHF. A&O X4.   154/80 RR 30 CBG 158 HR 54

## 2022-07-21 NOTE — H&P (Signed)
History and Physical    PatientMarland [name] Ian [name] FKC:[id number] DOB: 1935/07/23 DOA: 07/21/2022 DOS: the patient was seen and examined on 07/21/2022 PCP: Lauree Chandler, NP  Patient coming from: [facility]; NOK: [place], [place], [phone]   Chief Complaint: SOB  HPI: Ian [name] is a 87 y.o. male with medical history significant of dementia, stage 3b/4 CKD, COPD on 2L home O2, DM, HTN, HLD, BPH, and OSA presenting with SOB.  He reports that for up to a week he was having some chest tightness and some stinging.  He wears 4L and they turned it up to 5L but turned it back.  He hasn't been wearing his O2 the last month consistently and was doing ok without it but clearly needing it now.  His son thought he had a UTI, agitated more than usual and urinary incontinence - different than usual.  No LE edema last week, not wearing his compression stockings lately.  No cough.  No fever.  No recent orthopnea.  He has had palliative care, not seeing them regularly.     ER Course:  Palliative care at baseline.  Sent in from facility for SOB, hypoxia to 70s on 2L.  + cough.  Bradycardia with pacemaker.  Edema, trop 570, elevated BNP with acute on chronic CKD.  Was on NRB and now back on [place] O2.  DNR.     Review of Systems: As mentioned in the history of present illness. All other systems reviewed and are negative. Past Medical History:  Diagnosis Date   Alzheimer's disease ([place])    Anemia, unspecified    Anxiety and depression    Chronic maxillary sinusitis    CKD (chronic kidney disease)    3b/4   COPD (chronic obstructive pulmonary disease) (HCC)    Diabetes mellitus    Hypercalcemia    Hyperlipidemia    Hypertension    Hypertrophy of prostate without urinary obstruction and other lower urinary tract symptoms (LUTS)    SBO (small bowel obstruction) ([place]) 12/31/2016   Severe sepsis ([place]) 12/07/2021   Unspecified sleep apnea    Ventral hernia, unspecified, without mention of  obstruction or gangrene    Past Surgical History:  Procedure Laterality Date   EXPLORATORY LAPAROTOMY  2002   HERNIA REPAIR     Social History:  reports that he quit smoking about 34 years ago. His smoking use included cigarettes. He has a 45.00 pack-year smoking history. He has been exposed to tobacco smoke. He has never used smokeless tobacco. He reports that he does not drink alcohol and does not use drugs.  Allergies  Allergen Reactions   Ace Inhibitors Cough    Family History  Problem Relation Age of Onset   Pancreatic cancer Sister    Heart disease Father    Hypertension Brother    Hypertension Sister     Prior to Admission medications   Medication Sig Start Date End Date Taking? Authorizing Provider  acetaminophen (TYLENOL) 325 MG tablet Take 650 mg by mouth every 6 (six) hours as needed.    [provider]  acetaminophen (TYLENOL) 500 MG tablet Take 500 mg by mouth 2 (two) times daily.    [provider]  albuterol (VENTOLIN HFA) 108 (90 Base) MCG/ACT inhaler Inhale 2 puffs into the lungs every 6 (six) hours as needed for wheezing or shortness of breath. 06/25/21   Lauree Chandler, NP  aspirin EC (ASPIRIN LOW DOSE) 81 MG tablet TAKE 1 TABLET BY MOUTH EVERY  DAY --SWALLOW WHOLE 12/24/21   Lauree Chandler, NP  atorvastatin (LIPITOR) 80 MG tablet TAKE 1 TABLET BY MOUTH EVERY DAY 12/12/21   Lauree Chandler, NP  BESIVANCE 0.6 % SUSP Place 1 drop into the right eye 2 (two) times daily. 02/09/19   [provider]  blood glucose meter kit and supplies KIT Inject 1 each into the skin 2 (two) times daily. Dispense based on patient and insurance preference. Use up to four times daily as directed. (FOR ICD-9 250.00, 250.01). 08/23/19   Ngetich, Dinah C, NP  fluticasone (FLONASE) 50 MCG/ACT nasal spray SPRAY 1 SPRAY INTO BOTH NOSTRILS DAILY. 12/24/21   Chesley Mires, MD  glucose blood (ONE TOUCH ULTRA TEST) test strip Use to test blood sugar twice daily.  DX:  E11.9 03/13/21   Lauree Chandler, NP  guaiFENesin (MUCINEX) 600 MG 12 hr tablet Take 2 tablets (1,200 mg total) by mouth 2 (two) times daily as needed for cough or to loosen phlegm. 11/27/21   Ngetich, Dinah C, NP  hydrALAZINE (APRESOLINE) 25 MG tablet Take 25 mg by mouth 3 (three) times daily.    [provider]  ipratropium-albuterol (DUONEB) 0.5-2.5 (3) MG/3ML SOLN Take 3 mLs by nebulization every 6 (six) hours as needed (wheezing). 11/27/21   Ngetich, Nelda Bucks, NP  Lancet Devices (ONE TOUCH DELICA LANCING DEV) MISC Use to test blood sugar once daily dx: E119 02/06/18   [name], [name] L, DO  loratadine (CLARITIN) 10 MG tablet Take 1 tablet (10 mg total) by mouth daily. 07/20/21   Ngetich, Dinah C, NP  Melatonin 1 MG CAPS Take 1 capsule (1 mg total) by mouth daily. 05/16/22   Lauree Chandler, NP  metoprolol tartrate (LOPRESSOR) 50 MG tablet TAKE 1 TABLET BY MOUTH TWICE A DAY 08/27/21   Lauree Chandler, NP  mirtazapine (REMERON) 7.5 MG tablet Take 1 tablet (7.5 mg total) by mouth at bedtime. 06/24/22   Lauree Chandler, NP  montelukast (SINGULAIR) 10 MG tablet TAKE 1 TABLET BY MOUTH EVERYDAY AT BEDTIME 12/24/21   Chesley Mires, MD  Multiple Vitamin (MULTIVITAMIN WITH MINERALS) TABS tablet Take 1 tablet by mouth daily.    [provider]  Multiple Vitamins-Minerals (PRESERVISION AREDS PO) Take 1 tablet by mouth 2 (two) times daily.    [provider]  OXYGEN Inhale 4 L into the lungs continuous. Patient not taking: Reported on 06/24/2022    [provider]  polyethylene glycol (MIRALAX / GLYCOLAX) 17 g packet Take 17 g by mouth daily. 12/15/21   Florencia Reasons, MD  senna-docusate (SENOKOT-S) 8.6-50 MG tablet Take 1 tablet by mouth 2 (two) times daily. 12/14/21   Florencia Reasons, MD  sertraline (ZOLOFT) 50 MG tablet Take 50 mg by mouth daily.    [provider]  sodium chloride (OCEAN) 0.65 % SOLN nasal spray Place 1 spray into both nostrils daily as needed for congestion.     [provider]  SYMBICORT 160-4.5 MCG/ACT inhaler Inhale 2 puffs into the lungs 2 (two) times daily. 12/06/21   [provider]  torsemide (DEMADEX) 20 MG tablet Take 1 tablet (20 mg total) by mouth daily. 03/21/22   Lauree Chandler, NP  trimethoprim-polymyxin b (POLYTRIM) ophthalmic solution Place 1 drop into the right eye every 6 (six) hours. 2 days after each monthly eye injection. 02/09/19   [provider]  Vitamin D, Ergocalciferol, (DRISDOL) 1.25 MG (50000 UNIT) CAPS capsule Take 50,000 Units by mouth every 7 (seven) days.  Every Wed    [provider]    Physical Exam: Vitals:   07/21/22 1252 07/21/22 1722 07/21/22 1739 07/21/22 1741  BP: (!) 160/76   (!) 175/74  Pulse: 61   (!) 51  Resp: 18   (!) 25  Temp: 97.9 F (36.6 C) 98.5 F (36.9 C)  98.5 F (36.9 C)  TempSrc: Oral Oral  Oral  SpO2: 94%  96% 97%   General:  Appears calm and comfortable and is in NAD Eyes:  EOMI, normal lids, iris ENT:  hard of hearing with hearing aids in place, grossly normal lips & tongue, mmm Neck:  no LAD, masses or thyromegaly Cardiovascular:  RRR, no r/g, 8-9/2 systolic murmur. Tr LE edema.  Respiratory:   CTA bilaterally with no wheezes/rales/rhonchi.  Normal respiratory effort. Abdomen:  soft, NT, ND Skin:  no rash or induration seen on limited exam Musculoskeletal:  grossly normal tone BUE/BLE, good ROM, no bony abnormality Psychiatric:  grossly normal mood and affect, speech fluent and appropriate, AOx3 Neurologic:  CN 2-12 grossly intact, moves all extremities in coordinated fashion   Radiological Exams on Admission: Independently reviewed - see discussion in A/P where applicable  DG Chest Port 1 View  Result Date: 07/21/2022 CLINICAL DATA:  sob EXAM: PORTABLE CHEST 1 VIEW COMPARISON:  February 04, 2022 FINDINGS: The cardiomediastinal silhouette is unchanged in contour. Small bilateral pleural effusions, similar in comparison to prior. No pneumothorax.  Mild diffuse interstitial prominence and vascular congestion. Atherosclerotic calcifications. Visualized abdomen is unremarkable. IMPRESSION: Constellation of findings are favored to reflect pulmonary edema with small bilateral pleural effusions. Electronically Signed   By: Valentino Saxon M.D.   On: 07/21/2022 13:24    EKG: Independently reviewed.  NSR with rate 59; IVCD; nonspecific ST changes with no evidence of acute ischemia   Labs on Admission: I have personally reviewed the available labs and imaging studies at the time of the admission.  Pertinent labs:    CO2 20 Glucose 118 BUN 72/Creatinine 3.58/GFR 16; 44/3.25/18 on 9/11 BNP >4500 HS troponin 569 -> 668 WBC 7.2 Hgb 7.5; 8.9 on 9/11 Platelets 128   Assessment and Plan: Principal Problem:   Acute on chronic diastolic (congestive) heart failure (HCC) Active Problems:   Hypertension associated with diabetes (HCC)   COPD (chronic obstructive pulmonary disease) (HCC)   DM2 (diabetes mellitus, type 2) (HCC)   Hyperlipidemia associated with type 2 diabetes mellitus (HCC)   Chronic kidney disease, stage 4 (severe) (HCC)   Mild cognitive impairment with memory loss   Elevated troponin   DNR (do not resuscitate)    Acute on chronic diastolic CHF -Patient with known h/o chronic diastolic CHF presenting with worsening SOB and hypoxia to 70s despite home O2 -CXR consistent with mild pulmonary edema -Markedly elevated BNP  -With elevated BNP and abnl CXR, acute decompensated CHF seems probable as diagnosis -Will admit, as per the Emergency HF Mortality Risk Grade.  The patient has: severe pulmonary edema requiring new O2 therapy -Echo on 12/13/21 with EF 60-65% and grade 1 DD -Will continue ASA -CHF order set utilized -Cardiology consult -Was given Lasix 20 mg x 1 in ER and will repeat with 20 mg IV BID -Continue  O2 for now  Elevated troponin -Positive troponin with positive delta -Probably associated with demand  ischemia -He also does not appear to be a reasonable candidate for invasive interventions -Will consult cardiology fellow to assist with medical management at this time  Stage 4 CKD -Creatinine has been  uptrending since June -He is not a dialysis candidate -Will closely monitor with diuresis -May need increased dose of Lasix given renal impairment  COPD -On baseline 3-4L home O2 -Continue Flonase, Mucinex, Duonebs, Singulair, Claritin, Symbicort (Dulera per formulary) -Does not appear to have exacerbation at this time  Dementia -Lives in ALF/memory care -Continue mirtazapine, sertraline -Will add delirium precautions  HTN -Continue hydralazine, metoprolol -Will also add prn hydralazine  HLD -Continue Lipitor  DM -Diet controlled -Last A1c was 5.5 -There is no indication to start medication at this time, but will cover with sensitive-scale SSI for now  Goals of care -Patient is DNR - discussed with patient and son -However, he has progressive multisystem organ failure and appears likely to benefit from ongoing discussion with palliative care, will order     Advance Care Planning:   Code Status: DNR   Consults: Cardiology; palliative care; HF navigator; [facility] team; nutrition; PT/OT  DVT Prophylaxis: Lovenox  Family Communication: Son was present throughout evaluation  Severity of Illness: The appropriate patient status for this patient is INPATIENT. Inpatient status is judged to be reasonable and necessary in order to provide the required intensity of service to ensure the patient's safety. The patient's presenting symptoms, physical exam findings, and initial radiographic and laboratory data in the context of their chronic comorbidities is felt to place them at high risk for further clinical deterioration. Furthermore, it is not anticipated that the patient will be medically stable for discharge from the hospital within 2 midnights of admission.   * I certify that at  the point of admission it is my clinical judgment that the patient will require inpatient hospital care spanning beyond 2 midnights from the point of admission due to high intensity of service, high risk for further deterioration and high frequency of surveillance required.*  Author: Karmen Bongo, MD 07/21/2022 6:02 PM  For on call review [email or web address].

## 2022-07-21 NOTE — ED Notes (Signed)
Date and time results received: 07/21/22 1410 (use smartphrase ".now" to insert current time)  Test: Trop Critical Value: 569  Name of Provider Notified: Melina Copa

## 2022-07-21 NOTE — ED Provider Notes (Signed)
[place] EMERGENCY DEPARTMENT Provider Note   CSN: [id number] Arrival date & time: 07/21/22  1239     History  Chief Complaint  Patient presents with   hypoxia   Bradycardia    Ian [name] is a 87 y.o. male.  He is brought in by ambulance from his facility for shortness of breath.  He uses oxygen at baseline.  He says over the past few days he has been more short of breath and had some chest tightness.  He does not know if he has had any productive cough.  No fever.  Says his breathing feels better now.  EMS found with sats in the 70s on 2 L and placed on nonrebreather.  He was able to wean down back to 2 L during transport.  The history is provided by the patient and the EMS personnel.  Shortness of Breath Severity:  Moderate Onset quality:  Gradual Duration:  3 days Timing:  Intermittent Progression:  Worsening Chronicity:  Recurrent Relieved by:  None tried Worsened by:  Activity Ineffective treatments:  Rest and oxygen Associated symptoms: chest pain and cough   Associated symptoms: no abdominal pain, no fever, no hemoptysis and no sputum production        Home Medications Prior to Admission medications   Medication Sig Start Date End Date Taking? Authorizing Provider  acetaminophen (TYLENOL) 325 MG tablet Take 650 mg by mouth every 6 (six) hours as needed.    [provider]  acetaminophen (TYLENOL) 500 MG tablet Take 500 mg by mouth 2 (two) times daily.    [provider]  albuterol (VENTOLIN HFA) 108 (90 Base) MCG/ACT inhaler Inhale 2 puffs into the lungs every 6 (six) hours as needed for wheezing or shortness of breath. 06/25/21   Lauree Chandler, NP  aspirin EC (ASPIRIN LOW DOSE) 81 MG tablet TAKE 1 TABLET BY MOUTH EVERY DAY --SWALLOW WHOLE 12/24/21   Lauree Chandler, NP  atorvastatin (LIPITOR) 80 MG tablet TAKE 1 TABLET BY MOUTH EVERY DAY 12/12/21   Lauree Chandler, NP  BESIVANCE 0.6 % SUSP Place 1 drop into the right  eye 2 (two) times daily. 02/09/19   [provider]  blood glucose meter kit and supplies KIT Inject 1 each into the skin 2 (two) times daily. Dispense based on patient and insurance preference. Use up to four times daily as directed. (FOR ICD-9 250.00, 250.01). 08/23/19   Ngetich, Dinah C, NP  fluticasone (FLONASE) 50 MCG/ACT nasal spray SPRAY 1 SPRAY INTO BOTH NOSTRILS DAILY. 12/24/21   Chesley Mires, MD  glucose blood (ONE TOUCH ULTRA TEST) test strip Use to test blood sugar twice daily.  DX: E11.9 03/13/21   Lauree Chandler, NP  guaiFENesin (MUCINEX) 600 MG 12 hr tablet Take 2 tablets (1,200 mg total) by mouth 2 (two) times daily as needed for cough or to loosen phlegm. 11/27/21   Ngetich, Dinah C, NP  hydrALAZINE (APRESOLINE) 25 MG tablet Take 25 mg by mouth 3 (three) times daily.    [provider]  ipratropium-albuterol (DUONEB) 0.5-2.5 (3) MG/3ML SOLN Take 3 mLs by nebulization every 6 (six) hours as needed (wheezing). 11/27/21   Ngetich, Nelda Bucks, NP  Lancet Devices (ONE TOUCH DELICA LANCING DEV) MISC Use to test blood sugar once daily dx: E119 02/06/18   [name], [name] L, DO  loratadine (CLARITIN) 10 MG tablet Take 1 tablet (10 mg total) by mouth daily. 07/20/21   Ngetich, Nelda Bucks, NP  Melatonin  1 MG CAPS Take 1 capsule (1 mg total) by mouth daily. 05/16/22   Lauree Chandler, NP  metoprolol tartrate (LOPRESSOR) 50 MG tablet TAKE 1 TABLET BY MOUTH TWICE A DAY 08/27/21   Lauree Chandler, NP  mirtazapine (REMERON) 7.5 MG tablet Take 1 tablet (7.5 mg total) by mouth at bedtime. 06/24/22   Lauree Chandler, NP  montelukast (SINGULAIR) 10 MG tablet TAKE 1 TABLET BY MOUTH EVERYDAY AT BEDTIME 12/24/21   Chesley Mires, MD  Multiple Vitamin (MULTIVITAMIN WITH MINERALS) TABS tablet Take 1 tablet by mouth daily.    [provider]  Multiple Vitamins-Minerals (PRESERVISION AREDS PO) Take 1 tablet by mouth 2 (two) times daily.    [provider]  OXYGEN Inhale 4 L into the  lungs continuous. Patient not taking: Reported on 06/24/2022    [provider]  polyethylene glycol (MIRALAX / GLYCOLAX) 17 g packet Take 17 g by mouth daily. 12/15/21   Florencia Reasons, MD  senna-docusate (SENOKOT-S) 8.6-50 MG tablet Take 1 tablet by mouth 2 (two) times daily. 12/14/21   Florencia Reasons, MD  sertraline (ZOLOFT) 50 MG tablet Take 50 mg by mouth daily.    [provider]  sodium chloride (OCEAN) 0.65 % SOLN nasal spray Place 1 spray into both nostrils daily as needed for congestion.    [provider]  SYMBICORT 160-4.5 MCG/ACT inhaler Inhale 2 puffs into the lungs 2 (two) times daily. 12/06/21   [provider]  torsemide (DEMADEX) 20 MG tablet Take 1 tablet (20 mg total) by mouth daily. 03/21/22   Lauree Chandler, NP  trimethoprim-polymyxin b (POLYTRIM) ophthalmic solution Place 1 drop into the right eye every 6 (six) hours. 2 days after each monthly eye injection. 02/09/19   [provider]  Vitamin D, Ergocalciferol, (DRISDOL) 1.25 MG (50000 UNIT) CAPS capsule Take 50,000 Units by mouth every 7 (seven) days. Every Wed    [provider]      Allergies    Ace inhibitors    Review of Systems   Review of Systems  Constitutional:  Negative for fever.  Respiratory:  Positive for cough and shortness of breath. Negative for hemoptysis and sputum production.   Cardiovascular:  Positive for chest pain.  Gastrointestinal:  Negative for abdominal pain.    Physical Exam Updated Vital Signs BP (!) 160/76 (BP Location: Right Arm)   Pulse 61   Temp 97.9 F (36.6 C) (Oral)   Resp 18   SpO2 94%  Physical Exam Vitals and nursing note reviewed.  Constitutional:      General: He is not in acute distress.    Appearance: Normal appearance. He is well-developed.  HENT:     Head: Normocephalic and atraumatic.  Eyes:     Conjunctiva/sclera: Conjunctivae normal.  Cardiovascular:     Rate and Rhythm: Normal rate. Rhythm irregular.     Heart  sounds: Murmur heard.  Pulmonary:     Effort: Pulmonary effort is normal. No respiratory distress.     Breath sounds: Rhonchi present.  Abdominal:     Palpations: Abdomen is soft.     Tenderness: There is no abdominal tenderness.  Musculoskeletal:        General: No swelling.     Cervical back: Neck supple.     Right lower leg: Edema present.     Left lower leg: Edema present.  Skin:    General: Skin is warm and dry.     Capillary Refill: Capillary refill takes  less than 2 seconds.  Neurological:     General: No focal deficit present.     Mental Status: He is alert.     ED Results / Procedures / Treatments   Labs (all labs ordered are listed, but only abnormal results are displayed) Labs Reviewed  BRAIN NATRIURETIC PEPTIDE - Abnormal; Notable for the following components:      Result Value   B Natriuretic Peptide >4,500.0 (*)    All other components within normal limits  BASIC METABOLIC PANEL - Abnormal; Notable for the following components:   CO2 20 (*)    Glucose, Bld 118 (*)    BUN 72 (*)    Creatinine, Ser 3.58 (*)    GFR, Estimated 16 (*)    All other components within normal limits  CBC WITH DIFFERENTIAL/PLATELET - Abnormal; Notable for the following components:   RBC 2.24 (*)    Hemoglobin 7.5 (*)    HCT 24.9 (*)    MCV 111.2 (*)    Platelets 128 (*)    All other components within normal limits  TROPONIN I (HIGH SENSITIVITY) - Abnormal; Notable for the following components:   Troponin I (High Sensitivity) 569 (*)    All other components within normal limits  TROPONIN I (HIGH SENSITIVITY) - Abnormal; Notable for the following components:   Troponin I (High Sensitivity) 668 (*)    All other components within normal limits  RESP PANEL BY RT-PCR (RSV, FLU A&B, COVID)  [id number]  URINALYSIS, ROUTINE W REFLEX MICROSCOPIC    EKG EKG Interpretation  Date/Time:  Sunday July 21 2022 12:53:05 EST Ventricular Rate:  59 PR Interval:  202 QRS Duration: 102 QT  Interval:  449 QTC Calculation: 445 R Axis:   78 Text Interpretation: Sinus rhythm Minimal ST depression, diffuse leads No significant change since prior 6/23 Confirmed by Aletta Edouard [phone]) on 07/21/2022 12:58:31 PM  Radiology DG Chest Port 1 View  Result Date: 07/21/2022 CLINICAL DATA:  sob EXAM: PORTABLE CHEST 1 VIEW COMPARISON:  February 04, 2022 FINDINGS: The cardiomediastinal silhouette is unchanged in contour. Small bilateral pleural effusions, similar in comparison to prior. No pneumothorax. Mild diffuse interstitial prominence and vascular congestion. Atherosclerotic calcifications. Visualized abdomen is unremarkable. IMPRESSION: Constellation of findings are favored to reflect pulmonary edema with small bilateral pleural effusions. Electronically Signed   By: Valentino Saxon M.D.   On: 07/21/2022 13:24    Procedures Procedures    Medications Ordered in ED Medications  furosemide (LASIX) injection 20 mg (20 mg Intravenous Given 07/21/22 1411)    ED Course/ Medical Decision Making/ A&P Clinical Course as of 07/21/22 1645  Sun Jul 21, 2022  1322 Chest x-ray showing atelectasis versus infiltrate right base cardiomegaly.  Awaiting radiology reading. [MB]  1326 Apparently there is a COVID swab shortage and the patient may not be able to get his COVID swab. [MB]  7893 Discussed with Dr. Lorin Mercy from [facility] hospitalist service who will evaluate patient for admission. [MB]    Clinical Course User Index [MB] Hayden Rasmussen, MD                             Medical Decision Making Amount and/or Complexity of Data Reviewed Labs: ordered. Radiology: ordered.  Risk Prescription drug management. Decision regarding hospitalization.   This patient complains of shortness of breath chest tightness; this involves an extensive number of treatment Options and is a complaint that carries with it a high risk  of complications and morbidity. The differential includes pneumonia, ACS, COPD,  CHF, dissection, PE  I ordered, reviewed and interpreted labs, which included CBC with normal white count, hemoglobin lower than priors, chemistries with worsening CKD, troponins elevated, BNP elevated I ordered medication IV Lasix and oxygen and reviewed PMP when indicated. I ordered imaging studies which included chest x-ray and I independently    visualized and interpreted imaging which showed CHF bilateral effusion Additional history obtained from EMS Previous records obtained and reviewed in epic including recent PCP notes I consulted Dr. Lorin Mercy [facility] and discussed lab and imaging findings and discussed disposition.  Cardiac monitoring reviewed, sinus rhythm  Social determinants considered, no significant barriers Critical Interventions: None  After the interventions stated above, I reevaluated the patient and found patient to be resting comfortably on his baseline oxygen Admission and further testing considered, patient would benefit from mission to the hospital for further diuresis and possible cardiac workup.  Will need to clarify his palliative care status to see if there is any other interventions he would be appropriate for.         Final Clinical Impression(s) / ED Diagnoses Final diagnoses:  Acute on chronic congestive heart failure, unspecified heart failure type ([place])  Elevated troponin    Rx / DC Orders ED Discharge Orders     None         Hayden Rasmussen, MD 07/21/22 1649

## 2022-07-21 NOTE — Consult Note (Signed)
Cardiology Consult    Patient ID: [name] MRN: [id number], DOB/AGE: [id number]   Admit date: 07/21/2022 Date of Consult: 07/21/2022 Requesting Provider: Karmen Bongo, MD  PCP:  Lauree Chandler, NP   [facility] Providers Cardiologist:  None   Patient Profile    Ian [name] is a frail 87 y.o. gentleman with a history of CKD stage 4, COPD (not using O2), type 2 DM, hypertension, malnutrition, retinal vein occlusion, and BPH. He is being seen today for the evaluation of chest tightness with elevated troponin.   History of Present Illness    Reports more exertional dyspnea than baseline last several days with associated chest tightness, orthopnea, PND, very mild pedal edema. Has been needing up to 5L O2, hypoxic to 70s on 2L at his facility. Has been coughing as well. Says he was sent from facility when he started developing the chest tightness. Of note, is followed by palliative care. He states he is not interested in invasive therapies for the most part - particularly coronary angiography which would come with a significant risk of dialysis. He apparently has been seen by a nephrologist and was put on torsemide around September of last year after developing lower extremity edema. Unclear if he has been taking this. Apparently has not been using the oxygen consistently.   In the ED, he has been severely hypertensive. BNP > 4500, troponins 569->668. GFR continues to be in the 15-20 range, though BUN up to 72 and bicarb 20.   Past Medical History   Past Medical History:  Diagnosis Date   Alzheimer's disease ([place])    Anemia, unspecified    Anxiety and depression    Chronic maxillary sinusitis    CKD (chronic kidney disease)    3b/4   COPD (chronic obstructive pulmonary disease) (H[facility])    Diabetes mellitus    Hypercalcemia    Hyperlipidemia    Hypertension    Hypertrophy of prostate without urinary obstruction and other lower urinary tract symptoms (LUTS)    SBO (small bowel  obstruction) ([place]) 12/31/2016   Severe sepsis ([facility]) 12/07/2021   Unspecified sleep apnea    Ventral hernia, unspecified, without mention of obstruction or gangrene     Past Surgical History:  Procedure Laterality Date   EXPLORATORY LAPAROTOMY  2002   HERNIA REPAIR       Allergies  Allergen Reactions   Ace Inhibitors Cough   Inpatient Medications     acetaminophen  500 mg Oral BID   [START ON 07/22/2022] aspirin EC  81 mg Oral Daily   atorvastatin  80 mg Oral QPM   docusate sodium  100 mg Oral BID   enoxaparin (LOVENOX) injection  30 mg Subcutaneous Q24H   [START ON 07/22/2022] fluticasone  1 spray Each Nare Daily   furosemide  20 mg Intravenous BID   gatifloxacin  1 drop Right Eye BID   hydrALAZINE  25 mg Oral TID   [START ON 07/22/2022] insulin aspart  0-9 Units Subcutaneous TID WC   ipratropium-albuterol  3 mL Nebulization Q6H   [START ON 07/22/2022] loratadine  10 mg Oral Daily   melatonin  1.5 mg Oral QHS   metoprolol tartrate  50 mg Oral BID   mirtazapine  7.5 mg Oral QHS   mometasone-formoterol  2 puff Inhalation BID   montelukast  10 mg Oral QHS   [START ON 07/22/2022] polyethylene glycol  17 g Oral Daily   senna-docusate  1 tablet Oral BID   [START ON 07/22/2022]  sertraline  50 mg Oral Daily   sodium chloride flush  3 mL Intravenous Q12H    Family History    Family History  Problem Relation Age of Onset   Pancreatic cancer Sister    Heart disease Father    Hypertension Brother    Hypertension Sister    He indicated that his mother is deceased. He indicated that his father is deceased. He indicated that only one of his four sisters is alive. He indicated that only one of his three brothers is alive. He indicated that both of his sons are alive.   Social History    Social History   Socioeconomic History   Marital status: Widowed    Spouse name: Not on file   Number of children: 2   Years of education: Not on file   Highest education level: Not on file   Occupational History   Occupation: retired    Comment: paper company  Tobacco Use   Smoking status: Former    Packs/day: 1.50    Years: 30.00    Total pack years: 45.00    Types: Cigarettes    Quit date: 07/08/1988    Years since quitting: 34.0    Passive exposure: Past   Smokeless tobacco: Never  Vaping Use   Vaping Use: Never used  Substance and Sexual Activity   Alcohol use: No   Drug use: No   Sexual activity: Not Currently  Other Topics Concern   Not on file  Social History Narrative   Not on file   Social Determinants of Health   Financial Resource Strain: Low Risk  (07/02/2017)   Overall Financial Resource Strain (CARDIA)    Difficulty of Paying Living Expenses: Not hard at all  Food Insecurity: No Food Insecurity (07/02/2017)   Hunger Vital Sign    Worried About Running Out of Food in the Last Year: Never true    [place] in the Last Year: Never true  Transportation Needs: No Transportation Needs (07/02/2017)   PRAPARE - Hydrologist (Medical): No    Lack of Transportation (Non-Medical): No  Physical Activity: Unknown (07/06/2018)   Exercise Vital Sign    Days of Exercise per Week: 4 days    Minutes of Exercise per Session: Not on file  Stress: No Stress Concern Present (07/02/2017)   [place]    Feeling of Stress : Only a little  Social Connections: Socially Integrated (07/02/2017)   Social Connection and Isolation Panel [NHANES]    Frequency of Communication with Friends and Family: More than three times a week    Frequency of Social Gatherings with Friends and Family: More than three times a week    Attends Religious Services: More than 4 times per year    Active Member of [employer] or Organizations: Yes    Attends Archivist Meetings: Never    Marital Status: Married  Human resources officer Violence: Not At Risk (07/02/2017)   Humiliation, Afraid,  Rape, and Kick questionnaire    Fear of Current or Ex-Partner: No    Emotionally Abused: No    Physically Abused: No    Sexually Abused: No     Review of Systems    A comprehensive review of systems was obtained with pertinent positives and negatives noted in the HPI.   Physical Exam    Blood pressure (!) 175/74, pulse (!) 51, temperature 98.5 F (36.9 C), temperature  source Oral, resp. rate (!) 25, SpO2 97 %.     Intake/Output Summary (Last 24 hours) at 07/21/2022 1809 Last data filed at 07/21/2022 1802 Gross per 24 hour  Intake --  Output 580 ml  Net -580 ml   Wt Readings from Last 3 Encounters:  06/24/22 67.1 kg  04/26/22 70.3 kg  04/19/22 72.1 kg   CONSTITUTIONAL: alert, frail appearing, in mild distress HEENT: symmetric sclera and conjunctiva normal NECK: JVP elevated but difficult to quantify due to accessory muscle use;  no masses CARDIAC: Intermittently irregular rhythm with grouped beating. 2/6 systolic murmur at base. No friction rub. VASCULAR: Radial pulses intact bilaterally. PULMONARY/CHEST WALL: no deformities, lung sounds diminished, increased normal work of breathing ABDOMINAL: ventral/umbilical hernia present. soft, non-tender. EXTREMITIES: 1+ pedal edema, warm and well-perfused SKIN: Scattered ecchymoses. No peripheral cyanosis. NEUROLOGIC: alert and conversant, no abnormal movements,    Labs    Recent Labs    07/21/22 1255 07/21/22 1456  TROPONINIHS 569* 668*   Lab Results  Component Value Date   WBC 7.2 07/21/2022   HGB 7.5 (L) 07/21/2022   HCT 24.9 (L) 07/21/2022   MCV 111.2 (H) 07/21/2022   PLT 128 (L) 07/21/2022    Recent Labs  Lab 07/21/22 1255  NA 141  K 4.6  CL 111  CO2 20*  BUN 72*  CREATININE 3.58*  CALCIUM 9.6  GLUCOSE 118*   Lab Results  Component Value Date   CHOL 143 01/11/2022   HDL 43 01/11/2022   LDLCALC 77 01/11/2022   TRIG 132 01/11/2022   No results found for: "DDIMER" Recent Labs    12/07/21 2301  03/18/22 1127 07/21/22 1255  BNP 726.1* 664* >4,500.0*      Radiology Studies    DG Chest Port 1 View  Result Date: 07/21/2022 CLINICAL DATA:  sob EXAM: PORTABLE CHEST 1 VIEW COMPARISON:  February 04, 2022 FINDINGS: The cardiomediastinal silhouette is unchanged in contour. Small bilateral pleural effusions, similar in comparison to prior. No pneumothorax. Mild diffuse interstitial prominence and vascular congestion. Atherosclerotic calcifications. Visualized abdomen is unremarkable. IMPRESSION: Constellation of findings are favored to reflect pulmonary edema with small bilateral pleural effusions. Electronically Signed   By: Valentino Saxon M.D.   On: 07/21/2022 13:24    ECG & Cardiac Imaging    ECG: sinus rhythm with frequent PACs, LVH, nonspecific T-wave changes - personally reviewed.  TTE 12/13/2021: Moderate concentric LVH EF 60-65%, normal wall motion Elevated left ventricular end-diastolic pressure. Impaired relaxation.  Normal RV size and function. Normal PASP. Left atrial size was severely dilated.  Mild aortic valve stenosis.  Pericardial effusion is circumferential.  Estimated RAP is normal   Assessment & Plan    Acute on chronic HFpEF: he has significant congestion with JVD present and in mild distress, currently NYHA class 4. Adequately perfused though. Needs better blood pressure control. - Escalate Lasix to 80mg  and assess response - Strict I&Os - place foley if needed (incontinent) - Change hydralazine to Bidil 20/37.5 every 8 hours - Half home dose of metoprolol - Updated TTE  Type 2 myocardial injury: troponin is in the 500-600 range but I think this is not completely out of proportion to his degree of concomitant heart, renal, and respiratory failure in addition to severe hypertension. Regardless, he is not an appropriate candidate for coronary angiography given his advanced CKD, frailty, and goals of care. Treat medically with focus on HF management.  - Aspirin  only; would not start heparin at this point -  He is on high-intensity statin, reasonable to continue - Continue metoprolol but reduce dose to 25mg  BID for now - Can obtain TTE to help guide therapy  Signed, Marykay Lex, MD 07/21/2022, 6:09 PM  For questions or updates, please contact   Please consult [email or web address] for contact info under Cardiology/STEMI.

## 2022-07-22 ENCOUNTER — Inpatient Hospital Stay (HOSPITAL_COMMUNITY): Payer: PPO

## 2022-07-22 DIAGNOSIS — N390 Urinary tract infection, site not specified: Secondary | ICD-10-CM | POA: Diagnosis present

## 2022-07-22 DIAGNOSIS — I5021 Acute systolic (congestive) heart failure: Secondary | ICD-10-CM

## 2022-07-22 DIAGNOSIS — Z66 Do not resuscitate: Secondary | ICD-10-CM

## 2022-07-22 DIAGNOSIS — N179 Acute kidney failure, unspecified: Secondary | ICD-10-CM | POA: Diagnosis not present

## 2022-07-22 DIAGNOSIS — E785 Hyperlipidemia, unspecified: Secondary | ICD-10-CM

## 2022-07-22 DIAGNOSIS — Z7189 Other specified counseling: Secondary | ICD-10-CM

## 2022-07-22 DIAGNOSIS — I5033 Acute on chronic diastolic (congestive) heart failure: Secondary | ICD-10-CM | POA: Diagnosis not present

## 2022-07-22 DIAGNOSIS — I10 Essential (primary) hypertension: Secondary | ICD-10-CM

## 2022-07-22 DIAGNOSIS — D649 Anemia, unspecified: Secondary | ICD-10-CM | POA: Insufficient documentation

## 2022-07-22 DIAGNOSIS — E1169 Type 2 diabetes mellitus with other specified complication: Secondary | ICD-10-CM

## 2022-07-22 DIAGNOSIS — N189 Chronic kidney disease, unspecified: Secondary | ICD-10-CM

## 2022-07-22 LAB — CBC
HCT: 23.4 % — ABNORMAL LOW (ref 39.0–52.0)
Hemoglobin: 7.3 g/dL — ABNORMAL LOW (ref 13.0–17.0)
MCH: 34.3 pg — ABNORMAL HIGH (ref 26.0–34.0)
MCHC: 31.2 g/dL (ref 30.0–36.0)
MCV: 109.9 fL — ABNORMAL HIGH (ref 80.0–100.0)
Platelets: 118 10*3/uL — ABNORMAL LOW (ref 150–400)
RBC: 2.13 MIL/uL — ABNORMAL LOW (ref 4.22–5.81)
RDW: 15.1 % (ref 11.5–15.5)
WBC: 6.5 10*3/uL (ref 4.0–10.5)
nRBC: 0 % (ref 0.0–0.2)

## 2022-07-22 LAB — ECHOCARDIOGRAM COMPLETE
AR max vel: 1.24 cm2
AV Area VTI: 1.18 cm2
AV Area mean vel: 1.2 cm2
AV Mean grad: 15.3 mmHg
AV Peak grad: 27.7 mmHg
Ao pk vel: 2.63 m/s
Area-P 1/2: 2.83 cm2
S' Lateral: 3.8 cm

## 2022-07-22 LAB — BASIC METABOLIC PANEL
Anion gap: 9 (ref 5–15)
BUN: 76 mg/dL — ABNORMAL HIGH (ref 8–23)
CO2: 21 mmol/L — ABNORMAL LOW (ref 22–32)
Calcium: 9.6 mg/dL (ref 8.9–10.3)
Chloride: 112 mmol/L — ABNORMAL HIGH (ref 98–111)
Creatinine, Ser: 3.65 mg/dL — ABNORMAL HIGH (ref 0.61–1.24)
GFR, Estimated: 16 mL/min — ABNORMAL LOW (ref >60)
Glucose, Bld: 108 mg/dL — ABNORMAL HIGH (ref 70–99)
Potassium: 4.1 mmol/L (ref 3.5–5.1)
Sodium: 142 mmol/L (ref 135–145)

## 2022-07-22 LAB — CBG MONITORING, ED
Glucose-Capillary: 104 mg/dL — ABNORMAL HIGH (ref 70–99)
Glucose-Capillary: 185 mg/dL — ABNORMAL HIGH (ref 70–99)

## 2022-07-22 LAB — GLUCOSE, CAPILLARY
Glucose-Capillary: 110 mg/dL — ABNORMAL HIGH (ref 70–99)
Glucose-Capillary: 151 mg/dL — ABNORMAL HIGH (ref 70–99)

## 2022-07-22 MED ORDER — AMOXICILLIN 250 MG PO CAPS
250.0000 mg | ORAL_CAPSULE | Freq: Two times a day (BID) | ORAL | Status: DC
  Administered 2022-07-22 – 2022-07-23 (×3): 250 mg via ORAL
  Filled 2022-07-22 (×5): qty 1

## 2022-07-22 MED ORDER — ISOSORB DINITRATE-HYDRALAZINE 20-37.5 MG PO TABS
2.0000 | ORAL_TABLET | Freq: Three times a day (TID) | ORAL | Status: DC
  Administered 2022-07-22 – 2022-07-23 (×5): 2 via ORAL
  Filled 2022-07-22 (×5): qty 2

## 2022-07-22 MED ORDER — FUROSEMIDE 10 MG/ML IJ SOLN: 80.0000 mg | Freq: Two times a day (BID) | INTRAMUSCULAR | Status: DC

## 2022-07-22 MED ORDER — FUROSEMIDE 10 MG/ML IJ SOLN
80.0000 mg | Freq: Two times a day (BID) | INTRAMUSCULAR | Status: DC
  Administered 2022-07-22 – 2022-07-23 (×3): 80 mg via INTRAVENOUS
  Filled 2022-07-22 (×3): qty 8

## 2022-07-22 NOTE — Assessment & Plan Note (Addendum)
Positive pyuria and urinary symptoms, will continue antibiotic therapy with amoxicillin.  Urine culture positive for enterococcus, in the past has been sensitive to ampicillin.

## 2022-07-22 NOTE — Assessment & Plan Note (Signed)
CKD stage 4   Renal function with serum cr at 3,65 with K at 4,1 and serum bicarbonate at 21. Plan to continue diuresis with furosemide for hypervolemia. Follow up renal function in am. Avoid hypotension and nephrotoxic medications

## 2022-07-22 NOTE — Consult Note (Signed)
Consultation Note Date: 07/22/2022   Patient Name: Ian [name]  DOB: [birth year]  MRN: [id number]  Age / Sex: 87 y.o., male  PCP: Lauree Chandler, NP Referring Physician: Tawni Millers  Reason for Consultation: Establishing goals of care  HPI/Patient Profile: 87 y.o. male  with past medical history of dementia, stage 3b/4 CKD, COPD on 2L home O2, DM, HTN, HLD, BPH, and OSA  admitted on 07/21/2022 with SOB.   Patient is followed by outpatient palliative care and currently admitted for acute on chronic CHF. PMT has been consulted to assist with goals of care conversation.  Clinical Assessment and Goals of Care:  I have reviewed medical records including EPIC notes, labs and imaging, received report from RN, assessed the patient and met at the bedside to discuss diagnosis prognosis, GOC, EOL wishes, disposition and options.  I introduced Palliative Medicine as specialized medical care for people living with serious illness. It focuses on providing relief from the symptoms and stress of a serious illness. The goal is to improve quality of life for both the patient and the family.  We discussed a brief life review of the patient and then focused on their current illness.   I attempted to elicit values and goals of care important to the patient.    Medical History Review and Understanding:  Patient reports a good understanding of the severity of his illness.  He realizes he is a poor candidate (and also not interested in) for invasive interventions, but rather focusing on medical interventions.  Social History: Patient tells me he has 2 sons.  There are no other family members remaining alive.  He is currently residing at [facility].  He tells me it is more tolerable than the previous facility he was in.  He has been a widow for the past 5 years and was living in his home alone from then up until 4 to 5  months ago.  He previously worked for a [employer] in [place].  He has not driven in the past 6 months.  Functional and Nutritional State: Patient reports a poor appetite.  He states for about a month at SNF, he hardly ate anything due to the poor choices and food.  Albumin of 2.9 noted on 12/11/2021.  Palliative Symptoms: Constipation, generalized pain  Advance Directives: No documentation currently on file.  Code Status: Concepts specific to code status, artifical feeding and hydration, and rehospitalization were considered and discussed.   Discussion: Patient tells me his goal is to get well.  He feels like his quality of life is okay, as long as he can live without his current pain.  This is already improved somewhat from earlier today, currently a 5 or 6 out of 10.  He previously took Tylenol for pain and is not sure why he stopped doing this. He has difficulty thinking about what he would feel if his quality of life or any worse.  There have been times where he has asked himself "why live any more?"  particularly since leaving his home and independence and relying more on others for care in facilities.  He states that he has had a "good attitude" overall, with the exception of the low he felt when he went into a facility did not provide the best care.  He also notes that he does not want to be a burden to his children, but they have their own families to attend to.  He understands the possibility that he could not improve from  his current illness, stating that he would rather go to his own home with hospice if this is his "last fling."  He wishes to continue with medical management to see how he responds.  He declines my offer to call his sons for support at this time.   Discussed the importance of continued conversation with family and the medical providers regarding overall plan of care and treatment options, ensuring decisions are within the context of the patient's values and GOCs.    Questions and concerns were addressed.  Patient states he already has hard choices for loving people booklet from outpatient palliative.  The family was encouraged to call with questions or concerns.  PMT will continue to support holistically.   SUMMARY OF RECOMMENDATIONS   -Continue DNR/DNI -Continue current care, patient is hopeful for improvement -Patient is also open to hospice if symptoms do not improve -Psychosocial and emotional support provided -Deferred calling patient's son at his request -PMT will continue to follow and support  Prognosis:  Poor long-term prognosis   Discharge Planning: To Be Determined      Primary Diagnoses: Present on Admission:  Acute on chronic diastolic (congestive) heart failure (HCC)  Mild cognitive impairment with memory loss  Hypertension associated with diabetes ([place])  Hyperlipidemia associated with type 2 diabetes mellitus (HCC)  COPD (chronic obstructive pulmonary disease) (HCC)  Chronic kidney disease, stage 4 (severe) (HCC)  Elevated troponin  DNR (do not resuscitate)   Physical Exam Vitals and nursing note reviewed.  Constitutional:      General: He is not in acute distress.    Appearance: He is ill-appearing.     Interventions: Nasal cannula in place.     Comments: 5L  Cardiovascular:     Rate and Rhythm: Bradycardia present.  Pulmonary:     Effort: Pulmonary effort is normal. No respiratory distress.  Neurological:     Mental Status: He is alert and oriented to person, place, and time.  Psychiatric:        Mood and Affect: Mood normal.        Behavior: Behavior is cooperative.        Cognition and Memory: Cognition normal.    Vital Signs: BP 139/65   Pulse (!) 51   Temp 98.1 F (36.7 C) (Oral)   Resp (!) 22   SpO2 98%  Pain Scale: 0-10   Pain Score: 6    SpO2: SpO2: 98 % O2 Device:SpO2: 98 % O2 Flow Rate: .O2 Flow Rate (L/min): 5 L/min   Palliative Assessment/Data: TBD    MDM: high   Merrianne Mccumbers Johnnette Litter, PA-C  Palliative Medicine Team Team phone # [phone]  Thank you for allowing the Palliative Medicine Team to assist in the care of this patient. Please utilize secure chat with additional questions, if there is no response within 30 minutes please call the above phone number.  Palliative Medicine Team providers are available by phone from 7am to 7pm daily and can be reached through the team cell phone.  Should this patient require assistance outside of these hours, please call the patient's attending physician.

## 2022-07-22 NOTE — Hospital Course (Addendum)
Ian [name] was admitted to the hospital with the working diagnosis of decompensated heart failure in the setting or worsening rena failure.   87 yo male with the past medical history of CKD, COPD, hypertension, and dyslipidemia who presented with dyspnea, chest pain and 02 desaturation. Most history obtained from his son, because patient's cognitive impairment. Apparently patient had urinary symptom for the last 3 to 4 days, associated with confusion. His symptoms continue to progress, until the day of presentation when patient had dyspnea, bradycardia, chest tightness and low 02 saturation, EMS was called. His 02 saturation was in the 80's, he was placed on a non rebreather and was transported to the ED.  On his initial physical examination his blood pressure was 160/76, HR 61, RR 18 and 02 saturation 94%, lungs with no wheezing or rales, heart with S1 and S2 present and rhythmic, 3-4 systolic murmur, abdomen with no distention and trace bilateral lower extremity edema.   Na 141, K 4,6 CL 111, bicarbonate 20 glucose 118 bun 72 cr 3,58  BNP >4,500 High sensitive troponin 569, 668   Wbc 7,2 hgb 7,5 plt 128  Sars covid 19 negative Influenza A and B negative   Urine analysis with SG 1,010, 100 protein, 21-50 wbc, small leukocytes.   Chest radiograph with cardiomegaly, with bilateral hilar vascular congestion and small bilateral pleural effusions, more notable on the left.   EKG 59 bpm, normal axis, normal intervals, sinus rhythm with poor R R wave progression, with no significant ST segment or T wave changes, positive LVH.   01/16 improvement in volume status. Poor prognosis, due to renal failure and cognitive impairment.  Plan for home hospice at the time of discharge

## 2022-07-22 NOTE — ED Notes (Signed)
ED TO INPATIENT HANDOFF REPORT  ED Nurse Name and Phone #:Lashanti Chambless W [phone]  S Name/Age/Gender Ian [name] 87 y.o. male Room/Bed: 045C/045C  Code Status   Code Status: DNR  Home/SNF/Other Skilled nursing facility  Patient oriented to: self, place, time, and situation Is this baseline? Yes   Triage Complete: Triage complete  Chief Complaint Acute on chronic diastolic (congestive) heart failure ([place]) [I50.33]  Triage Note Pt BIB [facility] from [facility] facility d/t hypoxia and brady. Pt was found by the staff member w sat in the 70s on his baseline 2L. Upon EMS arrival, pt was in the low 80s, was placed on NRB. Pt had hx of hypoxia episodes in the past. Pt was on 2L O2 enroute w EMS w O2 maintained in the 90s. Pt's HR was also in the 50s. Hx CHF. A&O X4.   154/80 RR 30 CBG 158 HR 54    Allergies Allergies  Allergen Reactions   Ace Inhibitors Cough    Level of Care/Admitting Diagnosis ED Disposition     ED Disposition  Admit   Condition  --   Comment  Hospital Area: [place] [[id number]]  Level of Care: Telemetry Cardiac [[id number]]  May admit patient to [facility] or [facility] if equivalent level of care is available:: No  Covid Evaluation: Asymptomatic - no recent exposure (last 10 days) testing not required  Diagnosis: Acute on chronic diastolic (congestive) heart failure [facility]) [[id number]]  Admitting Physician: Karmen Bongo [[id number]]  Attending Physician: Karmen Bongo [[id number]]  Certification:: I certify this patient will need inpatient services for at least 2 midnights  Estimated Length of Stay: 3          B Medical/Surgery History Past Medical History:  Diagnosis Date   Alzheimer's disease ([place])    Anemia, unspecified    Anxiety and depression    Chronic maxillary sinusitis    CKD (chronic kidney disease)    3b/4   COPD (chronic obstructive pulmonary disease) ([place])    Diabetes mellitus    Hypercalcemia    Hyperlipidemia     Hypertension    Hypertrophy of prostate without urinary obstruction and other lower urinary tract symptoms (LUTS)    SBO (small bowel obstruction) ([place]) 12/31/2016   Severe sepsis ([place]) 12/07/2021   Unspecified sleep apnea    Ventral hernia, unspecified, without mention of obstruction or gangrene    Past Surgical History:  Procedure Laterality Date   EXPLORATORY LAPAROTOMY  2002   HERNIA REPAIR       A IV Location/Drains/Wounds Patient Lines/Drains/Airways Status     Active Line/Drains/Airways     Name Placement date Placement time Site Days   Peripheral IV 07/21/22 20 G Left Antecubital 07/21/22  2125  Antecubital  1   External Urinary Catheter 07/21/22  2130  --  1            Intake/Output Last 24 hours  Intake/Output Summary (Last 24 hours) at 07/22/2022 1506 Last data filed at 07/22/2022 0815 Gross per 24 hour  Intake --  Output 1480 ml  Net -1480 ml    [name]/Imaging Results for orders placed or performed during the hospital encounter of 07/21/22 (from the past 48 hour(s))  Brain natriuretic peptide     Status: Abnormal   Collection Time: 07/21/22 12:55 PM  Result Value Ref Range   B Natriuretic Peptide >4,500.0 (H) 0.0 - 100.0 pg/mL    Comment: Performed at [facility], [address] [place], [place]  [address]  Basic metabolic panel     Status: Abnormal   Collection Time: 07/21/22 12:55 PM  Result Value Ref Range   Sodium 141 135 - 145 mmol/L   Potassium 4.6 3.5 - 5.1 mmol/L   Chloride 111 98 - 111 mmol/L   CO2 20 (L) 22 - 32 mmol/L   Glucose, Bld 118 (H) 70 - 99 mg/dL    Comment: Glucose reference range applies only to samples taken after fasting for at least 8 hours.   BUN 72 (H) 8 - 23 mg/dL   Creatinine, Ser 3.58 (H) 0.61 - 1.24 mg/dL   Calcium 9.6 8.9 - 10.3 mg/dL   GFR, Estimated 16 (L) >60 mL/min    Comment: (NOTE) Calculated using the CKD-EPI Creatinine Equation (2021)    Anion gap 10 5 - 15    Comment: Performed at [facility] [address], [place], [place] [address]  Troponin I (High Sensitivity)     Status: Abnormal   Collection Time: 07/21/22 12:55 PM  Result Value Ref Range   Troponin I (High Sensitivity) 569 (HH) <18 ng/L    Comment: CRITICAL RESULT CALLED TO, READ BACK BY AND VERIFIED WITH T,DEVOLT RN @1359  07/21/22 E,BENTON (NOTE) Elevated high sensitivity troponin I (hsTnI) values and significant  changes across serial measurements may suggest ACS but many other  chronic and acute conditions are known to elevate hsTnI results.  Refer to the "Links" section for chest pain algorithms and additional  guidance. Performed at [facility], [place] [address], [place], [place] [address]   CBC with Differential     Status: Abnormal   Collection Time: 07/21/22 12:55 PM  Result Value Ref Range   WBC 7.2 4.0 - 10.5 K/uL   RBC 2.24 (L) 4.22 - 5.81 MIL/uL   Hemoglobin 7.5 (L) 13.0 - 17.0 g/dL   HCT 24.9 (L) 39.0 - 52.0 %   MCV 111.2 (H) 80.0 - 100.0 fL   MCH 33.5 26.0 - 34.0 pg   MCHC 30.1 30.0 - 36.0 g/dL   RDW 15.5 11.5 - 15.5 %   Platelets 128 (L) 150 - 400 K/uL   nRBC 0.0 0.0 - 0.2 %   Neutrophils Relative % 88 %   Neutro Abs 6.3 1.7 - 7.7 K/uL   Lymphocytes Relative 11 %   Lymphs Abs 0.8 0.7 - 4.0 K/uL   Monocytes Relative 1 %   Monocytes Absolute 0.1 0.1 - 1.0 K/uL   Eosinophils Relative 0 %   Eosinophils Absolute 0.0 0.0 - 0.5 K/uL   Basophils Relative 0 %   Basophils Absolute 0.0 0.0 - 0.1 K/uL   nRBC 0 0 /100 WBC   Abs Immature Granulocytes 0.00 0.00 - 0.07 K/uL   Polychromasia PRESENT     Comment: Performed at [facility], [place] [address] [place],  [address]  Urinalysis, Routine w reflex microscopic Urine, Clean Catch     Status: Abnormal   Collection Time: 07/21/22  2:20 PM  Result Value Ref Range   Color, Urine YELLOW YELLOW   APPearance HAZY (A) CLEAR   Specific Gravity, Urine 1.010 1.005 - 1.030   pH 5.0 5.0 - 8.0   Glucose, UA NEGATIVE NEGATIVE mg/dL   Hgb urine  dipstick NEGATIVE NEGATIVE   Bilirubin Urine NEGATIVE NEGATIVE   Ketones, ur NEGATIVE NEGATIVE mg/dL   Protein, ur 100 (A) NEGATIVE mg/dL   Nitrite NEGATIVE NEGATIVE   Leukocytes,Ua SMALL (A) NEGATIVE   RBC /  HPF 0-5 0 - 5 RBC/hpf   WBC, UA 21-50 0 - 5 WBC/hpf   Bacteria, UA RARE (A) NONE SEEN   Squamous Epithelial / HPF 0-5 0 - 5 /HPF   Mucus PRESENT     Comment: Performed at [facility], [place] [address], [place], [place] [address]  Troponin I (High Sensitivity)     Status: Abnormal   Collection Time: 07/21/22  2:56 PM  Result Value Ref Range   Troponin I (High Sensitivity) 668 (HH) <18 ng/L    Comment: CRITICAL VALUE NOTED. VALUE IS CONSISTENT WITH PREVIOUSLY REPORTED/CALLED VALUE (NOTE) Elevated high sensitivity troponin I (hsTnI) values and significant  changes across serial measurements may suggest ACS but many other  chronic and acute conditions are known to elevate hsTnI results.  Refer to the "Links" section for chest pain algorithms and additional  guidance. Performed at [facility], [place] [address], [place], [place] [address]   Resp panel by RT-PCR (RSV, Flu A&B, Covid) Anterior Nasal Swab     Status: None   Collection Time: 07/21/22  5:15 PM   Specimen: Anterior Nasal Swab  Result Value Ref Range   SARS Coronavirus 2 by RT PCR NEGATIVE NEGATIVE    Comment: (NOTE) SARS-CoV-2 target nucleic acids are NOT DETECTED.  The SARS-CoV-2 RNA is generally detectable in upper respiratory specimens during the acute phase of infection. The lowest concentration of SARS-CoV-2 viral copies this assay can detect is 138 copies/mL. A negative result does not preclude SARS-Cov-2 infection and should not be used as the sole basis for treatment or other patient management decisions. A negative result may occur with  improper specimen collection/handling, submission of specimen other than nasopharyngeal swab, presence of viral mutation(s) within the areas targeted by this  assay, and inadequate number of viral copies(<138 copies/mL). A negative result must be combined with clinical observations, patient history, and epidemiological information. The expected result is Negative.  Fact Sheet for Patients:  EntrepreneurPulse.com.au  Fact Sheet for Healthcare Providers:  IncredibleEmployment.be  This test is no t yet approved or cleared by the [place] FDA and  has been authorized for detection and/or diagnosis of SARS-CoV-2 by FDA under an Emergency Use Authorization (EUA). This EUA will remain  in effect (meaning this test can be used) for the duration of the COVID-19 declaration under Section 564(b)(1) of the Act, 21 U.S.C.section 360bbb-3(b)(1), unless the authorization is terminated  or revoked sooner.       Influenza A by PCR NEGATIVE NEGATIVE   Influenza B by PCR NEGATIVE NEGATIVE    Comment: (NOTE) The Xpert Xpress SARS-CoV-2/FLU/RSV plus assay is intended as an aid in the diagnosis of influenza from Nasopharyngeal swab specimens and should not be used as a sole basis for treatment. Nasal washings and aspirates are unacceptable for Xpert Xpress SARS-CoV-2/FLU/RSV testing.  Fact Sheet for Patients: EntrepreneurPulse.com.au  Fact Sheet for Healthcare Providers: IncredibleEmployment.be  This test is not yet approved or cleared by the [place] FDA and has been authorized for detection and/or diagnosis of SARS-CoV-2 by FDA under an Emergency Use Authorization (EUA). This EUA will remain in effect (meaning this test can be used) for the duration of the COVID-19 declaration under Section 564(b)(1) of the Act, 21 U.S.C. section 360bbb-3(b)(1), unless the authorization is terminated or revoked.     Resp Syncytial Virus by PCR NEGATIVE NEGATIVE    Comment: (NOTE) Fact Sheet for Patients: EntrepreneurPulse.com.au  Fact Sheet for Healthcare  Providers: IncredibleEmployment.be  This test  is not yet approved or cleared by the [place] and has been authorized for detection and/or diagnosis of SARS-CoV-2 by FDA under an Emergency Use Authorization (EUA). This EUA will remain in effect (meaning this test can be used) for the duration of the COVID-19 declaration under Section 564(b)(1) of the Act, 21 U.S.C. section 360bbb-3(b)(1), unless the authorization is terminated or revoked.  Performed at [facility], [place] [address], [place], [place] [address]   Urine Culture     Status: Abnormal (Preliminary result)   Collection Time: 07/21/22  5:50 PM   Specimen: Urine, Clean Catch  Result Value Ref Range   Specimen Description URINE, CLEAN CATCH    Special Requests NONE    Culture (A)     50,000 COLONIES/mL ENTEROCOCCUS FAECALIS SUSCEPTIBILITIES TO FOLLOW Performed at [facility], [place] [address] [place], [place] [address]    Report Status PENDING   CBG monitoring, ED     Status: Abnormal   Collection Time: 07/21/22 10:37 PM  Result Value Ref Range   Glucose-Capillary 120 (H) 70 - 99 mg/dL    Comment: Glucose reference range applies only to samples taken after fasting for at least 8 hours.  Basic metabolic panel     Status: Abnormal   Collection Time: 07/22/22  7:57 AM  Result Value Ref Range   Sodium 142 135 - 145 mmol/L   Potassium 4.1 3.5 - 5.1 mmol/L   Chloride 112 (H) 98 - 111 mmol/L   CO2 21 (L) 22 - 32 mmol/L   Glucose, Bld 108 (H) 70 - 99 mg/dL    Comment: Glucose reference range applies only to samples taken after fasting for at least 8 hours.   BUN 76 (H) 8 - 23 mg/dL   Creatinine, Ser 3.65 (H) 0.61 - 1.24 mg/dL   Calcium 9.6 8.9 - 10.3 mg/dL   GFR, Estimated 16 (L) >60 mL/min    Comment: (NOTE) Calculated using the CKD-EPI Creatinine Equation (2021)    Anion gap 9 5 - 15    Comment: Performed at [facility] [address], [place], [place] [address]  CBC      Status: Abnormal   Collection Time: 07/22/22  7:57 AM  Result Value Ref Range   WBC 6.5 4.0 - 10.5 K/uL   RBC 2.13 (L) 4.22 - 5.81 MIL/uL   Hemoglobin 7.3 (L) 13.0 - 17.0 g/dL   HCT 23.4 (L) 39.0 - 52.0 %   MCV 109.9 (H) 80.0 - 100.0 fL   MCH 34.3 (H) 26.0 - 34.0 pg   MCHC 31.2 30.0 - 36.0 g/dL   RDW 15.1 11.5 - 15.5 %   Platelets 118 (L) 150 - 400 K/uL   nRBC 0.0 0.0 - 0.2 %    Comment: Performed at [facility], [place] [address] [place],  [address]  CBG monitoring, ED     Status: Abnormal   Collection Time: 07/22/22  8:02 AM  Result Value Ref Range   Glucose-Capillary 104 (H) 70 - 99 mg/dL    Comment: Glucose reference range applies only to samples taken after fasting for at least 8 hours.  CBG monitoring, ED     Status: Abnormal   Collection Time: 07/22/22 11:57 AM  Result Value Ref Range   Glucose-Capillary 185 (H) 70 - 99 mg/dL    Comment: Glucose reference range applies only to samples taken after fasting for at least 8 hours.   DG Chest Port 1  View  Result Date: 07/21/2022 CLINICAL DATA:  sob EXAM: PORTABLE CHEST 1 VIEW COMPARISON:  February 04, 2022 FINDINGS: The cardiomediastinal silhouette is unchanged in contour. Small bilateral pleural effusions, similar in comparison to prior. No pneumothorax. Mild diffuse interstitial prominence and vascular congestion. Atherosclerotic calcifications. Visualized abdomen is unremarkable. IMPRESSION: Constellation of findings are favored to reflect pulmonary edema with small bilateral pleural effusions. Electronically Signed   By: Valentino Saxon M.D.   On: 07/21/2022 13:24    Pending [name] Unresulted [name] (From admission, onward)    None       Vitals/Pain Today's Vitals   07/22/22 1145 07/22/22 1210 07/22/22 1445 07/22/22 1453  BP: 139/65  (!) 112/51   Pulse: (!) 51  (!) 54   Resp: (!) 22  18   Temp:    97.7 F (36.5 C)  TempSrc:    Oral  SpO2: 98%  97%   PainSc:  6       Isolation Precautions No active  isolations  Medications Medications  acetaminophen (TYLENOL) tablet 500 mg (500 mg Oral Given 07/22/22 0850)  aspirin EC tablet 81 mg (81 mg Oral Given 07/22/22 0850)  atorvastatin (LIPITOR) tablet 80 mg (80 mg Oral Given 07/21/22 1755)  mirtazapine (REMERON) tablet 7.5 mg (7.5 mg Oral Given 07/21/22 2123)  sertraline (ZOLOFT) tablet 50 mg (50 mg Oral Given 07/22/22 0839)  polyethylene glycol (MIRALAX / GLYCOLAX) packet 17 g (17 g Oral Given 07/22/22 0838)  senna-docusate (Senokot-S) tablet 1 tablet (1 tablet Oral Given 07/22/22 0838)  melatonin tablet 1.5 mg (1.5 mg Oral Given 07/21/22 2123)  albuterol (PROVENTIL) (2.5 MG/3ML) 0.083% nebulizer solution 2.5 mg (has no administration in time range)  fluticasone (FLONASE) 50 MCG/ACT nasal spray 1 spray (has no administration in time range)  guaiFENesin (MUCINEX) 12 hr tablet 1,200 mg (has no administration in time range)  ipratropium-albuterol (DUONEB) 0.5-2.5 (3) MG/3ML nebulizer solution 3 mL (3 mLs Nebulization Given 07/22/22 0854)  loratadine (CLARITIN) tablet 10 mg (10 mg Oral Given 07/22/22 0839)  montelukast (SINGULAIR) tablet 10 mg (10 mg Oral Given 07/21/22 2122)  mometasone-formoterol (DULERA) 200-5 MCG/ACT inhaler 2 puff (2 puffs Inhalation Given 07/22/22 0925)  gatifloxacin (ZYMAXID) 0.5 % ophthalmic drops 1 drop (1 drop Right Eye Given 07/22/22 0854)  sodium chloride flush (NS) 0.9 % injection 3 mL (3 mLs Intravenous Given 07/22/22 1215)  acetaminophen (TYLENOL) tablet 650 mg (has no administration in time range)    Or  acetaminophen (TYLENOL) suppository 650 mg (has no administration in time range)  oxyCODONE (Oxy IR/ROXICODONE) immediate release tablet 5 mg (5 mg Oral Given 07/22/22 0839)  docusate sodium (COLACE) capsule 100 mg (100 mg Oral Given 07/22/22 0850)  polyethylene glycol (MIRALAX / GLYCOLAX) packet 17 g (has no administration in time range)  bisacodyl (DULCOLAX) EC tablet 5 mg (has no administration in time range)  ondansetron  (ZOFRAN) tablet 4 mg (has no administration in time range)    Or  ondansetron (ZOFRAN) injection 4 mg (has no administration in time range)  hydrALAZINE (APRESOLINE) injection 5 mg (has no administration in time range)  insulin aspart (novoLOG) injection 0-9 Units (2 Units Subcutaneous Given 07/22/22 1208)  enoxaparin (LOVENOX) injection 30 mg (30 mg Subcutaneous Given 07/21/22 1800)  metoprolol tartrate (LOPRESSOR) tablet 12.5 mg (12.5 mg Oral Not Given 07/22/22 0841)  isosorbide-hydrALAZINE (BIDIL) 20-37.5 MG per tablet 2 tablet (2 tablets Oral Given 07/22/22 1433)  furosemide (LASIX) injection 80 mg (80 mg Intravenous Given 07/22/22 1209)  furosemide (LASIX) injection 20  mg (20 mg Intravenous Given 07/21/22 1411)  furosemide (LASIX) injection 80 mg (80 mg Intravenous Given 07/21/22 2126)    Mobility walks with device     Focused Assessments Cardiac Assessment Handoff:  Cardiac Rhythm: Sinus bradycardia Lab Results  Component Value Date   CKTOTAL 39 (L) 12/08/2021   TROPONINI <0.03 04/07/2018   No results found for: "DDIMER" Does the Patient currently have chest pain? Yes    R Recommendations: See Admitting Provider Note  Report given to: Dairl Ponder, RN   Additional Notes: From [facility]. C.C. hypoxia and brady cardia. He is on 4 liters of oxygen, Pt said he does wear oxygen at the facility. HR been in the 40's, and 50's once in awhile low 60's. I did not give him the lopressor this morning. Dr Cathlean Sauer was made aware. He is also receiving Lasix and breathing treating.

## 2022-07-22 NOTE — Assessment & Plan Note (Signed)
DNR

## 2022-07-22 NOTE — Progress Notes (Signed)
Echocardiogram 2D Echocardiogram has been performed.  Ian [name] 07/22/2022, 3:12 PM

## 2022-07-22 NOTE — Assessment & Plan Note (Addendum)
Thrombocytopenia  Hgb is 8.1  and plt 142 Iron panel with serum iron 15, transferrin saturation 6, ferritin 146 and TIBC 267, consistent with anemia of iron deficiency combined with anemia of chronic disease. Will add IV iron infusion. No current indication for PRBC transfusion.  Follow cell count in am.

## 2022-07-22 NOTE — Progress Notes (Addendum)
Progress Note   Patient: Ian [name] KKX:[id number] DOB: [birth year] DOA: 07/21/2022     1 DOS: the patient was seen and examined on 07/22/2022   Brief hospital course: Ian [name] was admitted to the hospital with the working diagnosis of decompensated heart failure in the setting or worsening rena failure.   87 yo male with the past medical history of CKD, COPD, hypertension, and dyslipidemia who presented with dyspnea, chest pain and 02 desaturation. Most history obtained from his son, because patient's cognitive impairment. Apparently patient had urinary symptom for the last 3 to 4 days, associated with confusion. His symptoms continue to progress, until the day of presentation when patient had dyspnea, bradycardia, chest tightness and low 02 saturation, EMS was called. His 02 saturation was in the 80's, he was placed on a non rebreather and was transported to the ED.  On his initial physical examination his blood pressure was 160/76, HR 61, RR 18 and 02 saturation 94%, lungs with no wheezing or rales, heart with S1 and S2 present and rhythmic, 3-4 systolic murmur, abdomen with no distention and trace bilateral lower extremity edema.   Na 141, K 4,6 CL 111, bicarbonate 20 glucose 118 bun 72 cr 3,58  BNP >4,500 High sensitive troponin 569, 668   Wbc 7,2 hgb 7,5 plt 128  Sars covid 19 negative Influenza A and B negative   Urine analysis with SG 1,010, 100 protein, 21-50 wbc, small leukocytes.   Chest radiograph with cardiomegaly, with bilateral hilar vascular congestion and small bilateral pleural effusions, more notable on the left.   EKG 59 bpm, normal axis, normal intervals, sinus rhythm with poor R R wave progression, with no significant ST segment or T wave changes, positive LVH.   Assessment and Plan: * Acute on chronic diastolic (congestive) heart failure (HCC) Echocardiogram from 2023 with preserved LV systolic function and mild aortic stenosis.   Clinically feeling better.  Not  documented urine output  Plan to continue IV diuresis with furosemide After load reduction with hydralazine/ isosorbide combination Hold on metoprolol due to bradycardia, continue telemetry monitoring.  Limited pharmacological therapy due to low GFR  Acute kidney injury superimposed on chronic kidney disease (Ian [name]) CKD stage 4   Renal function with serum cr at 3,65 with K at 4,1 and serum bicarbonate at 21. Plan to continue diuresis with furosemide for hypervolemia. Follow up renal function in am. Avoid hypotension and nephrotoxic medications   COPD (chronic obstructive pulmonary disease) (HCC) No clinical signs of exacerbation,  Continue oxymetry monitoring and bronchodilator therapy. Keep 02 saturation 88% or greater.   Type 2 diabetes mellitus with hyperlipidemia (HCC) Continue glucose cover and monitoring with insulin sliding scale.   DNR (do not resuscitate) DNR  UTI (urinary tract infection) Positive pyuria and urinary symptoms, will continue antibiotic therapy with amoxicillin.  Urine culture positive for enterococcus, in the past has been sensitive to ampicillin.   Anemia Thrombocytopenia  Hgb is 7,3 and plt 118 Plan to check iron panel, suspected anemia of chronic disease.  No current indication for PRBC transfusion.  Follow cell count in am.         Subjective: Patient is feeling better, but not yet back to his baseline, his son is at the bedside.   Physical Exam: Vitals:   07/22/22 1530 07/22/22 1545 07/22/22 1600 07/22/22 1613  BP: (!) 104/49 (!) 107/56 114/63   Pulse: (!) 52 (!) 51 (!) 54   Resp: (!) 26 (!) 21 18   Temp:    Marland Kitchen)  97.2 F (36.2 C)  TempSrc:    Oral  SpO2: 98% 98% 97%    Neurology awake and alert ENT With no pallor Cardiovascular with S1 and S2 present and bradycardic, with systolic murmur at the apex, with no gallops,  No JVD Trace lower extremity edema Respiratory with rales at bases with no wheezing or rhonchi Abdomen with no  distention  Data Reviewed:    Family Communication: I spoke with patient's son at the bedside, we talked in detail about patient's condition, plan of care and prognosis and all questions were addressed.   Disposition: Status is: Inpatient Remains inpatient appropriate because: heart failure with volume overload and renal failure   Planned Discharge Destination:  assisted living facility      Author: Tawni Millers, MD 07/22/2022 4:42 PM  For on call review [email or web address].

## 2022-07-22 NOTE — Progress Notes (Addendum)
Rounding Note    Patient Name: Ian [name] Date of Encounter: 07/22/2022  [place] Cardiologist: None   Subjective   Patient reports feeling tightness in his chest. Tightness worsens when he tries to take a deep breath. Complains of "pain everywhere" , specifically in his neck, back, and epigastric region. Continues to have a hard time breathing   Inpatient Medications    Scheduled Meds:  acetaminophen  500 mg Oral BID   aspirin EC  81 mg Oral Daily   atorvastatin  80 mg Oral QPM   docusate sodium  100 mg Oral BID   enoxaparin (LOVENOX) injection  30 mg Subcutaneous Q24H   fluticasone  1 spray Each Nare Daily   gatifloxacin  1 drop Right Eye BID   insulin aspart  0-9 Units Subcutaneous TID WC   ipratropium-albuterol  3 mL Nebulization Q6H   isosorbide-hydrALAZINE  1 tablet Oral Q8H   loratadine  10 mg Oral Daily   melatonin  1.5 mg Oral QHS   metoprolol tartrate  12.5 mg Oral BID   mirtazapine  7.5 mg Oral QHS   mometasone-formoterol  2 puff Inhalation BID   montelukast  10 mg Oral QHS   polyethylene glycol  17 g Oral Daily   senna-docusate  1 tablet Oral BID   sertraline  50 mg Oral Daily   sodium chloride flush  3 mL Intravenous Q12H   Continuous Infusions:  PRN Meds: acetaminophen **OR** acetaminophen, albuterol, bisacodyl, guaiFENesin, hydrALAZINE, ondansetron **OR** ondansetron (ZOFRAN) IV, oxyCODONE, polyethylene glycol   Vital Signs    Vitals:   07/22/22 0745 07/22/22 0800 07/22/22 0815 07/22/22 0845  BP: (!) 160/71 (!) 170/70 (!) 178/73   Pulse: (!) 46 (!) 58 (!) 57 (!) 59  Resp:   20 (!) 21  Temp:      TempSrc:      SpO2: 99% 98% 95% 99%    Intake/Output Summary (Last 24 hours) at 07/22/2022 1016 Last data filed at 07/22/2022 0815 Gross per 24 hour  Intake --  Output 1680 ml  Net -1680 ml      06/24/2022   11:02 AM 04/26/2022   12:32 PM 04/19/2022   11:06 AM  Last 3 Weights  Weight (lbs) 148 lb 155 lb 159 lb  Weight (kg) 67.132  kg 70.308 kg 72.122 kg      Telemetry    Sinus rhythm, HR in the 50s  - Personally Reviewed  ECG    Sinus rhythm with PACs - Personally Reviewed  Physical Exam   GEN: No acute distress. Laying in the bed with head elevated  Neck: + JVD Cardiac: RRR,grade 2/6 systolic murmur at apex. Radial pulses 2+ bilaterally  Respiratory: Crackles in bilateral lower lungs  GI: Distended, tender to palpation in the epigastric region  MS: Trace edema in BLE; No deformity. Neuro:  Nonfocal  Psych: Normal affect   Labs    High Sensitivity Troponin:   Recent Labs  Lab 07/21/22 1255 07/21/22 1456  TROPONINIHS 569* 668*     Chemistry Recent Labs  Lab 07/21/22 1255 07/22/22 0757  NA 141 142  K 4.6 4.1  CL 111 112*  CO2 20* 21*  GLUCOSE 118* 108*  BUN 72* 76*  CREATININE 3.58* 3.65*  CALCIUM 9.6 9.6  GFRNONAA 16* 16*  ANIONGAP 10 9    Lipids No results for input(s): "CHOL", "TRIG", "HDL", "LABVLDL", "LDLCALC", "CHOLHDL" in the last 168 hours.  Hematology Recent Labs  Lab 07/21/22 1255 07/22/22 0757  WBC 7.2 6.5  RBC 2.24* 2.13*  HGB 7.5* 7.3*  HCT 24.9* 23.4*  MCV 111.2* 109.9*  MCH 33.5 34.3*  MCHC 30.1 31.2  RDW 15.5 15.1  PLT 128* 118*   Thyroid No results for input(s): "TSH", "FREET4" in the last 168 hours.  BNP Recent Labs  Lab 07/21/22 1255  BNP >4,500.0*    DDimer No results for input(s): "DDIMER" in the last 168 hours.   Radiology    DG Chest Port 1 View  Result Date: 07/21/2022 CLINICAL DATA:  sob EXAM: PORTABLE CHEST 1 VIEW COMPARISON:  February 04, 2022 FINDINGS: The cardiomediastinal silhouette is unchanged in contour. Small bilateral pleural effusions, similar in comparison to prior. No pneumothorax. Mild diffuse interstitial prominence and vascular congestion. Atherosclerotic calcifications. Visualized abdomen is unremarkable. IMPRESSION: Constellation of findings are favored to reflect pulmonary edema with small bilateral pleural effusions.  Electronically Signed   By: Valentino Saxon M.D.   On: 07/21/2022 13:24    Cardiac Studies   Echocardiogram Pending  Patient Profile     87 y.o. male with a history of CKD stage 4, COPD (not using O2), type 2 DM, hypertension, malnutrition, retinal vein occlusion, and BPH. He is being seen today for the evaluation of chest tightness with elevated troponin   Assessment & Plan     Acute on Chronic HFpEF  - Most recent echocardiogram from 12/2021 showed EF 60-65%, no regional wall motion abnormalities, moderate LVH, grade I diastolic dysfunction  - Patient was brought to the ED from his nursing facility on 1/14 due to hypoxia. Complained of exertional dyspnea that had been going on for several days. Associated with orthopnea, NPD, chest tightness. BNP >4500. CXR with pulmonary edema and small bilateral pleural effusions  - Patient given a dose of IV lasix 80 mg overnight. Output 0.88 L urine. Creatinine 3.58 on presentation  - Patient with JVD, abdominal distention, crackles in lungs. Start IV lasix 80 mg BID. Strict I/Os, daily weights, daily BMPs to monitor renal function  - Echo pending this admission - Note- patient is palliative care at baseline, I do not anticipate [place] pursuing aggressive therapies at this time  - GDMT limited by renal function  - Continue metoprolol 12.5 mg BID, bidil 20/37.5 mg TID   Elevated Troponin  - hsTn 814>481  - Suspect type 2 MI-- Patient significantly volume overloaded and has renal dysfunction. Trop trend is fairly flat, not consistent with ACS  - Echo pending - Unfortunately, regardless of echo results, patient is not a candidate for cath given renal dysfunction, frailty dementia, goals of care (patient sees palliative care and is DNR)  - Continue ASA, metoprolol tartrate 12.5 mg BID, lipitor 80 mg daily   HTN  - Managed with heart failure medications as above   CKD Stage IV - Creatinine 3.58 on presentation  - Need to closely monitor renal  function with diuresis - Note, patient not a dialysis candidate   Otherwise per primary  - Dementia  - COPD  - DM   For questions or updates, please contact [place] Please consult [email or web address] for contact info under        Signed, Margie Billet, PA-C  07/22/2022, 10:16 AM    Patient seen and examined and agree with Vikki Ports, PA-C as detailed above.  In brief, the patient is a 87 year old male with a history of CKD stage 4, COPD (not using O2), type 2 DM, hypertension, malnutrition, retinal vein occlusion, and BPH  who presented with chest pain and worsening volume overload found to have acute on chronic HF exacerbation for which Cardiology is consulted.  Patient presented from nursing facility with chest tightness, orthopnea and worsening abdominal distension. BNP >4500. Trop 038>882. Cr 3.58. HgB 7.3. CXR with pulmonary edema. Was started on lasix IV for diuresis. TTE pending.  Overall, suspect patient's acute presentation secondary to acute on chronic diastolic HF with low suspicion for ACS. Trop elevation likely demand in the setting of above. Notably, patient is on palliative so will manage conservatively. Will continue with aggressive diuresis and GDMT as tolerated. He is not a candidate for advanced therapies or HD. If fails to respond to diuresis, may need to consider transitioning to comfort measures.  GEN: No acute distress.   Neck: JVD to earlobe Cardiac: Bradycardic, 8-0/0 systolic murmur Respiratory: Bibasilar crackles GI: Soft, nontender, non-distended  MS: Trace edema, warm Neuro:  Nonfocal  Psych: Normal affect    Plan: -Patient is not a candidate for advanced therapies, interventions or HD -Follow-up TTE -Continue lasix 80mg  IV BID -Increase bidil to 2 tablets q8h for better afterload reduction -Continue metop 12.5mg  BID although HR may limit this -Cannot add ACE/ARB/spiro due to CKD IV -Agree with palliative consult as inpatient as  prognosis is poor and if does not respond to diureis, may need to transition to full comfort care  Gwyndolyn Kaufman, MD

## 2022-07-22 NOTE — Assessment & Plan Note (Addendum)
Echocardiogram from 2023 with preserved LV systolic function and mild aortic stenosis.   Clinically feeling better.  Not documented urine output  Plan to continue IV diuresis with furosemide After load reduction with hydralazine/ isosorbide combination Hold on metoprolol due to bradycardia, continue telemetry monitoring.  Limited pharmacological therapy due to low GFR

## 2022-07-22 NOTE — Progress Notes (Signed)
Heart Failure Navigator Progress Note  Assessed for Heart & Vascular TOC clinic readiness.  Patient per MD note: "- patient is palliative care at baseline, I do not anticipate [place] pursuing aggressive therapies at this time  - GDMT limited by renal function . "  Navigator will sign off at this time. Earnestine Leys, BSN, RN Heart Failure Transport planner Only

## 2022-07-22 NOTE — Assessment & Plan Note (Signed)
No clinical signs of exacerbation,  Continue oxymetry monitoring and bronchodilator therapy. Keep 02 saturation 88% or greater.

## 2022-07-22 NOTE — Plan of Care (Signed)

## 2022-07-22 NOTE — Progress Notes (Signed)
PT Cancellation Note  Patient Details Name: Ian [name] MRN: [id number] DOB: 09-15-1935   Cancelled Treatment:    Reason Eval/Treat Not Completed: Patient at procedure or test/unavailable;Other (comment) (OTF) on arrival. 07/22/2022  Ian Carne., PT Acute Rehabilitation Services [phone]  (office)   Ian [name] 07/22/2022, 4:27 PM

## 2022-07-22 NOTE — Assessment & Plan Note (Signed)
Continue glucose cover and monitoring with insulin sliding scale.

## 2022-07-23 DIAGNOSIS — N179 Acute kidney failure, unspecified: Secondary | ICD-10-CM | POA: Diagnosis not present

## 2022-07-23 DIAGNOSIS — Z515 Encounter for palliative care: Secondary | ICD-10-CM | POA: Diagnosis not present

## 2022-07-23 DIAGNOSIS — I10 Essential (primary) hypertension: Secondary | ICD-10-CM | POA: Diagnosis not present

## 2022-07-23 DIAGNOSIS — I5033 Acute on chronic diastolic (congestive) heart failure: Secondary | ICD-10-CM | POA: Diagnosis not present

## 2022-07-23 DIAGNOSIS — I509 Heart failure, unspecified: Secondary | ICD-10-CM | POA: Diagnosis not present

## 2022-07-23 DIAGNOSIS — Z789 Other specified health status: Secondary | ICD-10-CM

## 2022-07-23 DIAGNOSIS — R7989 Other specified abnormal findings of blood chemistry: Secondary | ICD-10-CM | POA: Diagnosis not present

## 2022-07-23 DIAGNOSIS — N189 Chronic kidney disease, unspecified: Secondary | ICD-10-CM | POA: Diagnosis not present

## 2022-07-23 DIAGNOSIS — Z711 Person with feared health complaint in whom no diagnosis is made: Secondary | ICD-10-CM

## 2022-07-23 DIAGNOSIS — E440 Moderate protein-calorie malnutrition: Secondary | ICD-10-CM | POA: Insufficient documentation

## 2022-07-23 LAB — GLUCOSE, CAPILLARY
Glucose-Capillary: 142 mg/dL — ABNORMAL HIGH (ref 70–99)
Glucose-Capillary: 150 mg/dL — ABNORMAL HIGH (ref 70–99)
Glucose-Capillary: 179 mg/dL — ABNORMAL HIGH (ref 70–99)
Glucose-Capillary: 113 mg/dL — ABNORMAL HIGH (ref 70–99)
Glucose-Capillary: 116 mg/dL — ABNORMAL HIGH (ref 70–99)

## 2022-07-23 LAB — BASIC METABOLIC PANEL
Anion gap: 11 (ref 5–15)
BUN: 83 mg/dL — ABNORMAL HIGH (ref 8–23)
CO2: 26 mmol/L (ref 22–32)
Calcium: 9.6 mg/dL (ref 8.9–10.3)
Chloride: 105 mmol/L (ref 98–111)
Creatinine, Ser: 4.08 mg/dL — ABNORMAL HIGH (ref 0.61–1.24)
GFR, Estimated: 14 mL/min — ABNORMAL LOW (ref >60)
Glucose, Bld: 156 mg/dL — ABNORMAL HIGH (ref 70–99)
Potassium: 5 mmol/L (ref 3.5–5.1)
Sodium: 142 mmol/L (ref 135–145)

## 2022-07-23 LAB — IRON AND TIBC
Iron: 15 ug/dL — ABNORMAL LOW (ref 45–182)
Saturation Ratios: 6 % — ABNORMAL LOW (ref 17.9–39.5)
TIBC: 267 ug/dL (ref 250–450)
UIBC: 252 ug/dL

## 2022-07-23 LAB — URINE CULTURE: Culture: 50000 — AB

## 2022-07-23 LAB — CBC
HCT: 25.9 % — ABNORMAL LOW (ref 39.0–52.0)
Hemoglobin: 8.1 g/dL — ABNORMAL LOW (ref 13.0–17.0)
MCH: 34 pg (ref 26.0–34.0)
MCHC: 31.3 g/dL (ref 30.0–36.0)
MCV: 108.8 fL — ABNORMAL HIGH (ref 80.0–100.0)
Platelets: 142 10*3/uL — ABNORMAL LOW (ref 150–400)
RBC: 2.38 MIL/uL — ABNORMAL LOW (ref 4.22–5.81)
RDW: 15.4 % (ref 11.5–15.5)
WBC: 8.1 10*3/uL (ref 4.0–10.5)
nRBC: 0 % (ref 0.0–0.2)

## 2022-07-23 LAB — FERRITIN: Ferritin: 146 ng/mL (ref 24–336)

## 2022-07-23 LAB — TRANSFERRIN: Transferrin: 184 mg/dL (ref 180–329)

## 2022-07-23 MED ORDER — ADULT MULTIVITAMIN W/MINERALS CH
1.0000 | ORAL_TABLET | Freq: Every day | ORAL | Status: DC
  Administered 2022-07-23: 1 via ORAL
  Filled 2022-07-23: qty 1

## 2022-07-23 MED ORDER — SODIUM CHLORIDE 0.9 % IV SOLN
510.0000 mg | Freq: Once | INTRAVENOUS | Status: DC
  Filled 2022-07-23: qty 17

## 2022-07-23 MED ORDER — ENSURE ENLIVE PO LIQD: 237.0000 mL | Freq: Two times a day (BID) | ORAL | Status: DC

## 2022-07-23 NOTE — Progress Notes (Addendum)
PROGRESS NOTE    Ian [name]  JOA:[id number] DOB: 05/28/1936 DOA: 07/21/2022 PCP: Lauree Chandler, NP  86/M with history of CKD4, COPD, hypertension, cognitive deficits and dyslipidemia who presented with dyspnea, chest pain and 02 desaturation. Apparently patient had urinary symptom for the last 3 to 4 days, associated with confusion. Symptoms progressed > dyspnea, bradycardia, chest tightness and low 02 saturation, EMS was called. Sats in 80's, NRM, in ED, noted to have trace edema, bun 72 cr 3,58, BNP >4,500, Hs troponin 569, 668 UA w/21-50 wbc, small leukocytes. CXR w/cardiomegaly, with bilateral hilar vascular congestion and small bilateral pleural effusions, -Cardiology consulted, diuresed with IV Lasix, hospital course complicated by worsening AKI on CKD 4/5 and dementia/delirium -Prognosis felt to be poor, palliative care recommended  Subjective:  Assessment and Plan:  Acute on chronic diastolic (congestive) heart failure ([place]) Echo-2023 with preserved LV systolic function and mild aortic stenosis.  -diuresed w/ IV lasix, now diuretics on hold -Hold Imdur and hydralzine today with low BP -GDMT limited by CKD -Cards recommends Palliative care w/ advanced age, dementia, worsening AKi on CKD4, I agree with this, he is not a dialysis candidate -Most appropriate for hospice services, will update son  AKi on CKD4 -baseline creat 2.6-3.2 -worsened now, cardiorenal syndrome -not an HD candidate, creatinine continues to trend up, 4.5 today with BUN of 86, prognosis is very poor -Palliative care consulted  COPD (chronic obstructive pulmonary disease) (HCC) -stable, O2, duonebs  Type 2 diabetes mellitus with hyperlipidemia (HCC) -stable CBGs, SSI  UTI (urinary tract infection) Positive pyuria and urinary symptoms, on amoxicillin.  Urine culture positive for enterococcus  Dementia/Cognitive deficits Delirium  Anemia Thrombocytopenia -due to CKD4 and Iron defi-noted on anemia  panel   DVT prophylaxis: Lovenox Code Status: DNR Family Communication: No family at bedside, will update son later today Disposition Plan: To be determined, ideally home with hospice services  Consultants: Cards, Palliative care   Procedures:   Antimicrobials:    Objective: Vitals:   07/24/22 0549 07/24/22 0631 07/24/22 0643 07/24/22 0720  BP: (!) 72/42 (!) 90/45 (!) 80/38 (!) 80/42  Pulse: 62   60  Resp:    20  Temp: 98.8 F (37.1 C)   98.8 F (37.1 C)  TempSrc: Axillary   Oral  SpO2: 98%   99%  Weight:        Intake/Output Summary (Last 24 hours) at 07/24/2022 0953 Last data filed at 07/24/2022 0608 Gross per 24 hour  Intake 600 ml  Output 500 ml  Net 100 ml   Filed Weights   07/23/22 0528  Weight: 68.9 kg    Examination: Frail chronically ill male laying in bed, eyes closed, somnolent, arousable, confused CVS: S1-S2, regular rhythm, systolic murmur Lungs: Decreased breath sounds at the bases Abdomen: Soft, nontender, bowel sounds present Extremities: No edema   Data Reviewed:   CBC: Recent Labs  Lab 07/21/22 1255 07/22/22 0757 07/23/22 0053  WBC 7.2 6.5 8.1  NEUTROABS 6.3  --   --   HGB 7.5* 7.3* 8.1*  HCT 24.9* 23.4* 25.9*  MCV 111.2* 109.9* 108.8*  PLT 128* 118* 601*   Basic Metabolic Panel: Recent Labs  Lab 07/21/22 1255 07/22/22 0757 07/23/22 0053 07/24/22 0058  NA 141 142 142 137  K 4.6 4.1 5.0 5.3*  CL 111 112* 105 103  CO2 20* 21* 26 25  GLUCOSE 118* 108* 156* 107*  BUN 72* 76* 83* 86*  CREATININE 3.58* 3.65* 4.08* 4.55*  CALCIUM 9.6 9.6  9.6 9.2   GFR: Estimated Creatinine Clearance: 10.9 mL/min (A) (by C-G formula based on SCr of 4.55 mg/dL (H)). Liver Function Tests: No results for input(s): "AST", "ALT", "ALKPHOS", "BILITOT", "PROT", "ALBUMIN" in the last 168 hours. No results for input(s): "LIPASE", "AMYLASE" in the last 168 hours. No results for input(s): "AMMONIA" in the last 168 hours. Coagulation Profile: No  results for input(s): "INR", "PROTIME" in the last 168 hours. Cardiac Enzymes: No results for input(s): "CKTOTAL", "CKMB", "CKMBINDEX", "TROPONINI" in the last 168 hours. BNP (last 3 results) No results for input(s): "PROBNP" in the last 8760 hours. HbA1C: No results for input(s): "HGBA1C" in the last 72 hours. CBG: Recent Labs  Lab 07/23/22 0847 07/23/22 1104 07/23/22 1535 07/23/22 2024 07/24/22 0629  GLUCAP 150* 179* 113* 116* 129*   Lipid Profile: No results for input(s): "CHOL", "HDL", "LDLCALC", "TRIG", "CHOLHDL", "LDLDIRECT" in the last 72 hours. Thyroid Function Tests: No results for input(s): "TSH", "T4TOTAL", "FREET4", "T3FREE", "THYROIDAB" in the last 72 hours. Anemia Panel: Recent Labs    07/23/22 0053  FERRITIN 146  TIBC 267  IRON 15*   Urine analysis:    Component Value Date/Time   COLORURINE YELLOW 07/21/2022 1420   APPEARANCEUR HAZY (A) 07/21/2022 1420   LABSPEC 1.010 07/21/2022 1420   PHURINE 5.0 07/21/2022 1420   GLUCOSEU NEGATIVE 07/21/2022 1420   HGBUR NEGATIVE 07/21/2022 1420   BILIRUBINUR NEGATIVE 07/21/2022 1420   BILIRUBINUR Negative 02/04/2022 1400   KETONESUR NEGATIVE 07/21/2022 1420   PROTEINUR 100 (A) 07/21/2022 1420   UROBILINOGEN 0.2 02/04/2022 1400   UROBILINOGEN 1.0 12/29/2012 1650   NITRITE NEGATIVE 07/21/2022 1420   LEUKOCYTESUR SMALL (A) 07/21/2022 1420   Sepsis Labs: @LABRCNTIP (procalcitonin:4,lacticidven:4)  ) Recent Results (from the past 240 hour(s))  Resp panel by RT-PCR (RSV, Flu A&B, Covid) Anterior Nasal Swab     Status: None   Collection Time: 07/21/22  5:15 PM   Specimen: Anterior Nasal Swab  Result Value Ref Range Status   SARS Coronavirus 2 by RT PCR NEGATIVE NEGATIVE Final    Comment: (NOTE) SARS-CoV-2 target nucleic acids are NOT DETECTED.  The SARS-CoV-2 RNA is generally detectable in upper respiratory specimens during the acute phase of infection. The lowest concentration of SARS-CoV-2 viral copies this  assay can detect is 138 copies/mL. A negative result does not preclude SARS-Cov-2 infection and should not be used as the sole basis for treatment or other patient management decisions. A negative result may occur with  improper specimen collection/handling, submission of specimen other than nasopharyngeal swab, presence of viral mutation(s) within the areas targeted by this assay, and inadequate number of viral copies(<138 copies/mL). A negative result must be combined with clinical observations, patient history, and epidemiological information. The expected result is Negative.  Fact Sheet for Patients:  EntrepreneurPulse.com.au  Fact Sheet for Healthcare Providers:  IncredibleEmployment.be  This test is no t yet approved or cleared by the [place] FDA and  has been authorized for detection and/or diagnosis of SARS-CoV-2 by FDA under an Emergency Use Authorization (EUA). This EUA will remain  in effect (meaning this test can be used) for the duration of the COVID-19 declaration under Section 564(b)(1) of the Act, 21 U.S.C.section 360bbb-3(b)(1), unless the authorization is terminated  or revoked sooner.       Influenza A by PCR NEGATIVE NEGATIVE Final   Influenza B by PCR NEGATIVE NEGATIVE Final    Comment: (NOTE) The Xpert Xpress SARS-CoV-2/FLU/RSV plus assay is intended as an aid in the diagnosis  of influenza from Nasopharyngeal swab specimens and should not be used as a sole basis for treatment. Nasal washings and aspirates are unacceptable for Xpert Xpress SARS-CoV-2/FLU/RSV testing.  Fact Sheet for Patients: EntrepreneurPulse.com.au  Fact Sheet for Healthcare Providers: IncredibleEmployment.be  This test is not yet approved or cleared by the [place] FDA and has been authorized for detection and/or diagnosis of SARS-CoV-2 by FDA under an Emergency Use Authorization (EUA). This EUA will  remain in effect (meaning this test can be used) for the duration of the COVID-19 declaration under Section 564(b)(1) of the Act, 21 U.S.C. section 360bbb-3(b)(1), unless the authorization is terminated or revoked.     Resp Syncytial Virus by PCR NEGATIVE NEGATIVE Final    Comment: (NOTE) Fact Sheet for Patients: EntrepreneurPulse.com.au  Fact Sheet for Healthcare Providers: IncredibleEmployment.be  This test is not yet approved or cleared by the [place] FDA and has been authorized for detection and/or diagnosis of SARS-CoV-2 by FDA under an Emergency Use Authorization (EUA). This EUA will remain in effect (meaning this test can be used) for the duration of the COVID-19 declaration under Section 564(b)(1) of the Act, 21 U.S.C. section 360bbb-3(b)(1), unless the authorization is terminated or revoked.  Performed at [facility], [place] [address] [place], [place] [address]   Urine Culture     Status: Abnormal   Collection Time: 07/21/22  5:50 PM   Specimen: Urine, Clean Catch  Result Value Ref Range Status   Specimen Description URINE, CLEAN CATCH  Final   Special Requests   Final    NONE Performed at [facility], [place] [address], [place], [place] [address]    Culture 50,000 COLONIES/mL ENTEROCOCCUS FAECALIS (A)  Final   Report Status 07/23/2022 FINAL  Final   Organism ID, Bacteria ENTEROCOCCUS FAECALIS (A)  Final      Susceptibility   Enterococcus faecalis - MIC*    AMPICILLIN <=2 SENSITIVE Sensitive     NITROFURANTOIN <=16 SENSITIVE Sensitive     VANCOMYCIN 1 SENSITIVE Sensitive     * 50,000 COLONIES/mL ENTEROCOCCUS FAECALIS     Radiology Studies: ECHOCARDIOGRAM COMPLETE  Result Date: 07/22/2022    ECHOCARDIOGRAM REPORT   Patient Name:   Ian [name] Date of Exam: 07/22/2022 Medical Rec #:  [id number]  Height:       66.0 in Accession #:    [id number] Weight:       148.0 lb Date of Birth:  1935-09-10  BSA:          1.760  m Patient Age:    19 years   BP:           131/59 mmHg Patient Gender: M          HR:           53 bpm. Exam Location:  Inpatient Procedure: 2D Echo, Cardiac Doppler and Color Doppler Indications:    CHF-Acute Systolic 734.19/F79.02  History:        Patient has prior history of Echocardiogram examinations, most                 recent 12/13/2021. CHF, COPD; Risk Factors:Hypertension, Diabetes                 and Dyslipidemia. CKD, stage III.  Sonographer:    Ronny Flurry Referring Phys: [place]  1. Left ventricular ejection fraction, by estimation, is 55 to 60%. The left ventricle has normal function. The left ventricle has no regional wall  motion abnormalities. There is mild left ventricular hypertrophy. Left ventricular diastolic parameters are consistent with Grade I diastolic dysfunction (impaired relaxation). Elevated left ventricular end-diastolic pressure.  2. Right ventricular systolic function is normal. The right ventricular size is normal.  3. Left atrial size was severely dilated.  4. The mitral valve is degenerative. Trivial mitral valve regurgitation. No evidence of mitral stenosis. Moderate mitral annular calcification.  5. Gradients similar to TTE done 12/13/21 . The aortic valve is normal in structure. Aortic valve regurgitation is trivial. Moderate aortic valve stenosis.  6. The inferior vena cava is dilated in size with >50% respiratory variability, suggesting right atrial pressure of 8 mmHg. FINDINGS  Left Ventricle: Left ventricular ejection fraction, by estimation, is 55 to 60%. The left ventricle has normal function. The left ventricle has no regional wall motion abnormalities. The left ventricular internal cavity size was normal in size. There is  mild left ventricular hypertrophy. Left ventricular diastolic parameters are consistent with Grade I diastolic dysfunction (impaired relaxation). Elevated left ventricular end-diastolic pressure. Right Ventricle: The right  ventricular size is normal. No increase in right ventricular wall thickness. Right ventricular systolic function is normal. Left Atrium: Left atrial size was severely dilated. Right Atrium: Right atrial size was normal in size. Pericardium: There is no evidence of pericardial effusion. Mitral Valve: The mitral valve is degenerative in appearance. There is moderate thickening of the mitral valve leaflet(s). There is moderate calcification of the mitral valve leaflet(s). Moderate mitral annular calcification. Trivial mitral valve regurgitation. No evidence of mitral valve stenosis. Tricuspid Valve: The tricuspid valve is normal in structure. Tricuspid valve regurgitation is mild . No evidence of tricuspid stenosis. Aortic Valve: Gradients similar to TTE done 12/13/21. The aortic valve is normal in structure. Aortic valve regurgitation is trivial. Moderate aortic stenosis is present. Aortic valve mean gradient measures 15.3 mmHg. Aortic valve peak gradient measures 27.7 mmHg. Aortic valve area, by VTI measures 1.18 cm. Pulmonic Valve: The pulmonic valve was normal in structure. Pulmonic valve regurgitation is mild. No evidence of pulmonic stenosis. Aorta: The aortic root is normal in size and structure. Venous: The inferior vena cava is dilated in size with greater than 50% respiratory variability, suggesting right atrial pressure of 8 mmHg. IAS/Shunts: No atrial level shunt detected by color flow Doppler.  LEFT VENTRICLE PLAX 2D LVIDd:         5.50 cm   Diastology LVIDs:         3.80 cm   LV e' medial:    3.07 cm/s LV PW:         1.20 cm   LV E/e' medial:  32.9 LV IVS:        1.30 cm   LV e' lateral:   4.56 cm/s LVOT diam:     2.00 cm   LV E/e' lateral: 22.1 LV SV:         76 LV SV Index:   43 LVOT Area:     3.14 cm  RIGHT VENTRICLE             IVC RV S prime:     13.40 cm/s  IVC diam: 2.30 cm TAPSE (M-mode): 1.9 cm LEFT ATRIUM             Index        RIGHT ATRIUM           Index LA diam:        5.00 cm 2.84 cm/m    RA Area:  19.90 cm LA Vol (A2C):   78.4 ml 44.56 ml/m  RA Volume:   64.95 ml  36.91 ml/m LA Vol (A4C):   89.2 ml 50.69 ml/m LA Biplane Vol: 89.0 ml 50.58 ml/m  AORTIC VALVE AV Area (Vmax):    1.24 cm AV Area (Vmean):   1.20 cm AV Area (VTI):     1.18 cm AV Vmax:           263.00 cm/s AV Vmean:          183.000 cm/s AV VTI:            0.645 m AV Peak Grad:      27.7 mmHg AV Mean Grad:      15.3 mmHg LVOT Vmax:         103.80 cm/s LVOT Vmean:        70.133 cm/s LVOT VTI:          0.242 m LVOT/AV VTI ratio: 0.37  AORTA Ao Root diam: 3.50 cm Ao Asc diam:  3.20 cm MITRAL VALVE                TRICUSPID VALVE MV Area (PHT): 2.83 cm     TR Peak grad:   49.6 mmHg MV Decel Time: 268 msec     TR Vmax:        352.00 cm/s MV E velocity: 101.00 cm/s MV A velocity: 112.00 cm/s  SHUNTS MV E/A ratio:  0.90         Systemic VTI:  0.24 m                             Systemic Diam: 2.00 cm Jenkins Rouge MD Electronically signed by Jenkins Rouge MD Signature Date/Time: 07/22/2022/5:15:36 PM    Final      Scheduled Meds:  acetaminophen  500 mg Oral BID   amoxicillin  250 mg Oral Q12H   aspirin EC  81 mg Oral Daily   atorvastatin  80 mg Oral QPM   docusate sodium  100 mg Oral BID   enoxaparin (LOVENOX) injection  30 mg Subcutaneous Q24H   feeding supplement  237 mL Oral BID BM   fluticasone  1 spray Each Nare Daily   gatifloxacin  1 drop Right Eye BID   insulin aspart  0-9 Units Subcutaneous TID WC   isosorbide-hydrALAZINE  2 tablet Oral Q8H   loratadine  10 mg Oral Daily   melatonin  1.5 mg Oral QHS   mirtazapine  7.5 mg Oral QHS   mometasone-formoterol  2 puff Inhalation BID   montelukast  10 mg Oral QHS   multivitamin with minerals  1 tablet Oral Daily   polyethylene glycol  17 g Oral Daily   senna-docusate  1 tablet Oral BID   sertraline  50 mg Oral Daily   sodium chloride flush  3 mL Intravenous Q12H   Continuous Infusions:  ferumoxytol (FERAHEME) 510 mg in sodium chloride 0.9 % 100 mL IVPB        LOS: 3 days    Time spent: 2min    Domenic Polite, MD [facility]   07/24/2022, 9:53 AM

## 2022-07-23 NOTE — Progress Notes (Signed)
   07/23/22 1116  Mobility  Activity Stood at bedside;Repositioned in chair  Level of Assistance Moderate assist, patient does 50-74%  Assistive Device Front wheel walker  Activity Response Tolerated well  Mobility Referral Yes  $Mobility charge 1 Mobility   Mobility Specialist Progress Note  Pt needing assistance with sitting up in chair. Had no c/o pain. Left in chair w/ all needs met and alarm on.   Ian [name] Mobility Specialist  Please contact via SecureChat or Rehab office at [phone]

## 2022-07-23 NOTE — Progress Notes (Signed)
Daily Progress Note   Patient Name: Ian [name]       Date: 07/23/2022 DOB: [birth year]  Age: 87 y.o. MRN#: [id number] Attending Physician: Tawni Millers Primary Care Physician: Lauree Chandler, NP Admit Date: 07/21/2022  Reason for Consultation/Follow-up: Establishing goals of care  Subjective: I have reviewed medical records including EPIC notes and labs. Received report from primary RN - no acute concerns.  Discussed case with PT who was leaving patient's room.  Went to visit patient at bedside - no family/visitors present. Patient was sitting up in chair, just finished working with PT. Patient is alert, oriented x4, and able to participate in conversation. No signs or non-verbal gestures of pain or discomfort noted. No respiratory distress, increased work of breathing, or secretions noted. He denies pain or shortness of breath. He is on 3L O2 [place].  Patient's son brought hearing aid batteries - their batteries had died this morning, which was making it difficult for him to communicate.  Emotional support provided to patient. Therapeutic listening provided as he reflects over the course of his hospitalization and discussions with my colleague yesterday. Reviewed that, unfortunately, his kidney function continues to decline. Education provided on CHF treatment in relation to CKD. Reviewed that he is likely not a dialysis candidate and cardiology states he is not a candidate for invasive/advanced therapies. Reviewed cardiology recommendation for hospice. Patient tells me "well, we've done all we can do." Reviewed continuing aggressive interventions vs transition to comfort care/hospice.  Provided education and counseling at length on the philosophy and benefits of hospice care. Discussed  that it offers a holistic approach to care in the setting of end-stage illness, and is about supporting the patient where they are allowing nature to take it's course. Discussed the hospice team includes RNs, physicians, social workers, and chaplains. They can provide personal care, support for the family, and help keep patient out of the hospital as well as assist with DME needs for home hospice. Education provided on the difference between home vs residential hospice. Patient would be most interested in returning to to [facility] with hospice services; however, he would like to gain additional information from hospice liaison.  Goal while in house is to continue current supportive treatment.   Therapeutic listening provided as patient tells me about his "good support system" and how the "  lord has been good to me."  Patient is agreeable for me to call his son/Ian [name] to provided updates on discussion as above.  All questions and concerns addressed. Encouraged to call with questions and/or concerns. PMT card provided.  2:53 PM Attempted to call son/Ian [name] to provide updates per conversation with patient as noted above - no answer - confidential voicemail left and PMT phone number provided with request to return call.   Length of Stay: 2  Current Medications: Scheduled Meds:   acetaminophen  500 mg Oral BID   amoxicillin  250 mg Oral Q12H   aspirin EC  81 mg Oral Daily   atorvastatin  80 mg Oral QPM   docusate sodium  100 mg Oral BID   enoxaparin (LOVENOX) injection  30 mg Subcutaneous Q24H   fluticasone  1 spray Each Nare Daily   gatifloxacin  1 drop Right Eye BID   insulin aspart  0-9 Units Subcutaneous TID WC   isosorbide-hydrALAZINE  2 tablet Oral Q8H   loratadine  10 mg Oral Daily   melatonin  1.5 mg Oral QHS   mirtazapine  7.5 mg Oral QHS   mometasone-formoterol  2 puff Inhalation BID   montelukast  10 mg Oral QHS   polyethylene glycol  17 g Oral Daily   senna-docusate  1 tablet Oral  BID   sertraline  50 mg Oral Daily   sodium chloride flush  3 mL Intravenous Q12H    Continuous Infusions:   PRN Meds: acetaminophen **OR** acetaminophen, albuterol, bisacodyl, guaiFENesin, hydrALAZINE, ondansetron **OR** ondansetron (ZOFRAN) IV, oxyCODONE, polyethylene glycol  Physical Exam Vitals and nursing note reviewed.  Constitutional:      General: He is not in acute distress.    Appearance: He is ill-appearing.  Pulmonary:     Effort: No respiratory distress.  Skin:    General: Skin is warm and dry.  Neurological:     Mental Status: He is alert and oriented to person, place, and time.     Motor: Weakness present.  Psychiatric:        Attention and Perception: Attention normal.        Behavior: Behavior is cooperative.        Cognition and Memory: Cognition and memory normal.             Vital Signs: BP (!) 108/43 (BP Location: Right Arm)   Pulse 63   Temp 97.9 F (36.6 C) (Oral)   Resp 20   Wt 68.9 kg   SpO2 92%   BMI 24.52 kg/m  SpO2: SpO2: 92 % O2 Device: O2 Device: Nasal Cannula O2 Flow Rate: O2 Flow Rate (L/min): 2 L/min  Intake/output summary:  Intake/Output Summary (Last 24 hours) at 07/23/2022 1412 Last data filed at 07/23/2022 1356 Gross per 24 hour  Intake 580 ml  Output 600 ml  Net -20 ml   LBM: Last BM Date : 07/18/22 Baseline Weight: Weight: 68.9 kg Most recent weight: Weight: 68.9 kg       Palliative Assessment/Data: PPS 50-60%      Patient Active Problem List   Diagnosis Date Noted   UTI (urinary tract infection) 07/22/2022   Anemia 07/22/2022   Acute on chronic diastolic (congestive) heart failure ([place]) 07/21/2022   Elevated troponin 07/21/2022   DNR (do not resuscitate) 07/21/2022   Underweight due to inadequate caloric intake 06/28/2022   Fever 06/28/2022   Pleural effusion 02/27/2022   CAP (community acquired pneumonia) 12/07/2021   Acute renal failure superimposed on  stage 4 chronic kidney disease ([place]) 81/85/6314    Acute metabolic encephalopathy 97/08/6376   Anemia, unspecified    Nasal congestion 12/18/2018   Depression with anxiety 12/18/2018   Chronic diastolic CHF (congestive heart failure) ([place]) 04/07/2018   HTN (hypertension) 04/07/2018   Chronic right-sided low back pain with right-sided sciatica 10/06/2017   Mild cognitive impairment with memory loss 11/04/2016   Chronic kidney disease (CKD) stage G3a/A2, moderately decreased glomerular filtration rate (GFR) between 45-59 mL/min/1.73 square meter and albuminuria creatinine ratio between 30-299 mg/g ([place]) 11/23/2015   Weight loss 11/23/2015   Obesity (BMI 30-39.9) 02/03/2014   Acute kidney injury superimposed on chronic kidney disease ([place]) 10/20/2013   Type 2 diabetes mellitus with hyperlipidemia ([place]) 09/17/2013   Insomnia 01/07/2013   DM2 (diabetes mellitus, type 2) ([place]) 12/29/2012   Hypertension associated with diabetes ([place])    COPD (chronic obstructive pulmonary disease) ([place])    Hyperlipidemia    Degenerative drusen 07/07/2012   COPD with asthma ([place]) 12/03/2011   Allergic rhinitis 12/03/2011   Branch retinal vein occlusion 06/28/2011   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    Palliative Care Assessment & Plan   Patient Profile: 87 y.o. male  with past medical history of dementia, stage 3b/4 CKD, COPD on 2L home O2, DM, HTN, HLD, BPH, and OSA  admitted on 07/21/2022 with SOB.    Patient is followed by outpatient palliative care and currently admitted for acute on chronic CHF. PMT has been consulted to assist with goals of care conversation.  Assessment: Principal Problem:   Acute on chronic diastolic (congestive) heart failure (HCC) Active Problems:   COPD (chronic obstructive pulmonary disease) (HCC)   Type 2 diabetes mellitus with hyperlipidemia (HCC)   Acute kidney injury superimposed on chronic kidney disease (HCC)   HTN (hypertension)   DNR (do not resuscitate)   UTI (urinary tract infection)    Anemia   Recommendations/Plan: Continue current supportive treatment  Continue DNR/DNI as previously documented - durable DNR form completed and placed in shadow chart. Copy was made and will be scanned into Vyn[facility]/ACP tab Patient is interested in discharging back to [facility] with hospice; however, he would like to speak with hospice liaison prior to finalizing this decision - TOC notified and consult placed Patient is already enrolled with [facility] outpatient Palliative Care PMT will continue to follow and support holistically  Goals of Care and Additional Recommendations: Limitations on Scope of Treatment: Avoid Hospitalization, Full Scope Treatment, No Artificial Feeding, and No Tracheostomy  Code Status:    Code Status Orders  (From admission, onward)           Start     Ordered   07/21/22 1740  Do not attempt resuscitation (DNR)  Continuous       Question Answer Comment  If patient has no pulse and is not breathing Do Not Attempt Resuscitation   If patient has a pulse and/or is breathing: Medical Treatment Goals LIMITED ADDITIONAL INTERVENTIONS: Use medication/IV fluids and cardiac monitoring as indicated; Do not use intubation or mechanical ventilation (DNI), also provide comfort medications.  Transfer to Progressive/Stepdown as indicated, avoid Intensive Care.   Consent: Discussion documented in EHR or advanced directives reviewed      07/21/22 1739           Code Status History     Date Active Date Inactive Code Status Order ID Comments User Context   06/24/2022 1343 07/21/2022 1239 DNR [id number]  Lauree Chandler, NP Outpatient   12/07/2021 2221  12/14/2021 1810 DNR [id number]  Rhetta Mura, DO Inpatient   05/27/2019 1354 12/07/2021 1430 DNR [id number] Discussed with patient in past, order being reentered [facility] Out[facility]   04/07/2018 0322 04/09/2018 1422 Full Code [id number]  Ivor Costa, MD ED   12/31/2016 0148 01/06/2017 1819 DNR [id number]  Etta Quill, DO ED   09/17/2013 0859 02/05/2016 1018 DNR [id number] No cpr, defibrillation, intubation, mechanical ventilation [facility]   12/29/2012 0054 01/04/2013 2032 Full Code [id number]  Rise Patience, MD Inpatient       Prognosis:  Poor long term   Discharge Planning: [place] with Hospice  Care plan was discussed with primary RN, Dr. Cathlean Sauer, [facility], patient  Thank you for allowing the Palliative Medicine Team to assist in the care of this patient.   Total Time 50 minutes Prolonged Time Billed  no       Greater than 50%  of this time was spent counseling and coordinating care related to the above assessment and plan.  Lin Landsman, NP  Please contact Palliative Medicine Team phone at [phone] for questions and concerns.   *Portions of this note are a verbal dictation therefore any spelling and/or grammatical errors are due to the "[employer] [employer]" system interpretation.

## 2022-07-23 NOTE — Progress Notes (Signed)
Rounding Note    Patient Name: Ian [name] Date of Encounter: 07/23/2022  [place] Cardiologist: None   Subjective   He is singing and delerious  Inpatient Medications    Scheduled Meds:  acetaminophen  500 mg Oral BID   amoxicillin  250 mg Oral Q12H   aspirin EC  81 mg Oral Daily   atorvastatin  80 mg Oral QPM   docusate sodium  100 mg Oral BID   enoxaparin (LOVENOX) injection  30 mg Subcutaneous Q24H   fluticasone  1 spray Each Nare Daily   furosemide  80 mg Intravenous BID   gatifloxacin  1 drop Right Eye BID   insulin aspart  0-9 Units Subcutaneous TID WC   ipratropium-albuterol  3 mL Nebulization Q6H   isosorbide-hydrALAZINE  2 tablet Oral Q8H   loratadine  10 mg Oral Daily   melatonin  1.5 mg Oral QHS   mirtazapine  7.5 mg Oral QHS   mometasone-formoterol  2 puff Inhalation BID   montelukast  10 mg Oral QHS   polyethylene glycol  17 g Oral Daily   senna-docusate  1 tablet Oral BID   sertraline  50 mg Oral Daily   sodium chloride flush  3 mL Intravenous Q12H   Continuous Infusions:  PRN Meds: acetaminophen **OR** acetaminophen, albuterol, bisacodyl, guaiFENesin, hydrALAZINE, ondansetron **OR** ondansetron (ZOFRAN) IV, oxyCODONE, polyethylene glycol   Vital Signs    Vitals:   07/23/22 0528 07/23/22 0812 07/23/22 0837 07/23/22 0849  BP: (!) 119/47 117/72    Pulse: 64 74    Resp: 19 20    Temp: 98.1 F (36.7 C) 98.3 F (36.8 C)    TempSrc: Oral Oral    SpO2: 97% 97% 99% 95%  Weight: 68.9 kg       Intake/Output Summary (Last 24 hours) at 07/23/2022 0932 Last data filed at 07/23/2022 7591 Gross per 24 hour  Intake 340 ml  Output 300 ml  Net 40 ml      07/23/2022    5:28 AM 06/24/2022   11:02 AM 04/26/2022   12:32 PM  Last 3 Weights  Weight (lbs) 151 lb 14.4 oz 148 lb 155 lb  Weight (kg) 68.9 kg 67.132 kg 70.308 kg      Telemetry    Sinus rhythm  - Personally Reviewed  ECG  No new- Personally Reviewed  Physical Exam   Exam  challenging , patient delerious GEN: sitting in a chair Cardiac: RRR, Respiratory: coarse BS GI: Distended, tender to palpation in the epigastric region  MS: Trace edema in BLE; No deformity. Neuro:  Nonfocal  Psych: Normal affect   Labs    High Sensitivity Troponin:   Recent Labs  Lab 07/21/22 1255 07/21/22 1456  TROPONINIHS 569* 668*     Chemistry Recent Labs  Lab 07/21/22 1255 07/22/22 0757 07/23/22 0053  NA 141 142 142  K 4.6 4.1 5.0  CL 111 112* 105  CO2 20* 21* 26  GLUCOSE 118* 108* 156*  BUN 72* 76* 83*  CREATININE 3.58* 3.65* 4.08*  CALCIUM 9.6 9.6 9.6  GFRNONAA 16* 16* 14*  ANIONGAP 10 9 11     Lipids No results for input(s): "CHOL", "TRIG", "HDL", "LABVLDL", "LDLCALC", "CHOLHDL" in the last 168 hours.  Hematology Recent Labs  Lab 07/21/22 1255 07/22/22 0757 07/23/22 0053  WBC 7.2 6.5 8.1  RBC 2.24* 2.13* 2.38*  HGB 7.5* 7.3* 8.1*  HCT 24.9* 23.4* 25.9*  MCV 111.2* 109.9* 108.8*  MCH 33.5 34.3* 34.0  MCHC 30.1 31.2 31.3  RDW 15.5 15.1 15.4  PLT 128* 118* 142*   Thyroid No results for input(s): "TSH", "FREET4" in the last 168 hours.  BNP Recent Labs  Lab 07/21/22 1255  BNP >4,500.0*    DDimer No results for input(s): "DDIMER" in the last 168 hours.   Radiology    ECHOCARDIOGRAM COMPLETE  Result Date: 07/22/2022    ECHOCARDIOGRAM REPORT   Patient Name:   Ian [name] Date of Exam: 07/22/2022 Medical Rec #:  [id number]  Height:       66.0 in Accession #:    [id number] Weight:       148.0 lb Date of Birth:  1935/12/11  BSA:          1.760 m Patient Age:    87 years   BP:           131/59 mmHg Patient Gender: M          HR:           53 bpm. Exam Location:  Inpatient Procedure: 2D Echo, Cardiac Doppler and Color Doppler Indications:    CHF-Acute Systolic 440.34/V42.59  History:        Patient has prior history of Echocardiogram examinations, most                 recent 12/13/2021. CHF, COPD; Risk Factors:Hypertension, Diabetes                 and  Dyslipidemia. CKD, stage III.  Sonographer:    Ronny Flurry Referring Phys: [place]  1. Left ventricular ejection fraction, by estimation, is 55 to 60%. The left ventricle has normal function. The left ventricle has no regional wall motion abnormalities. There is mild left ventricular hypertrophy. Left ventricular diastolic parameters are consistent with Grade I diastolic dysfunction (impaired relaxation). Elevated left ventricular end-diastolic pressure.  2. Right ventricular systolic function is normal. The right ventricular size is normal.  3. Left atrial size was severely dilated.  4. The mitral valve is degenerative. Trivial mitral valve regurgitation. No evidence of mitral stenosis. Moderate mitral annular calcification.  5. Gradients similar to TTE done 12/13/21 . The aortic valve is normal in structure. Aortic valve regurgitation is trivial. Moderate aortic valve stenosis.  6. The inferior vena cava is dilated in size with >50% respiratory variability, suggesting right atrial pressure of 8 mmHg. FINDINGS  Left Ventricle: Left ventricular ejection fraction, by estimation, is 55 to 60%. The left ventricle has normal function. The left ventricle has no regional wall motion abnormalities. The left ventricular internal cavity size was normal in size. There is  mild left ventricular hypertrophy. Left ventricular diastolic parameters are consistent with Grade I diastolic dysfunction (impaired relaxation). Elevated left ventricular end-diastolic pressure. Right Ventricle: The right ventricular size is normal. No increase in right ventricular wall thickness. Right ventricular systolic function is normal. Left Atrium: Left atrial size was severely dilated. Right Atrium: Right atrial size was normal in size. Pericardium: There is no evidence of pericardial effusion. Mitral Valve: The mitral valve is degenerative in appearance. There is moderate thickening of the mitral valve leaflet(s). There  is moderate calcification of the mitral valve leaflet(s). Moderate mitral annular calcification. Trivial mitral valve regurgitation. No evidence of mitral valve stenosis. Tricuspid Valve: The tricuspid valve is normal in structure. Tricuspid valve regurgitation is mild . No evidence of tricuspid stenosis. Aortic Valve: Gradients similar to TTE done 12/13/21. The aortic valve is normal in structure. Aortic valve regurgitation  is trivial. Moderate aortic stenosis is present. Aortic valve mean gradient measures 15.3 mmHg. Aortic valve peak gradient measures 27.7 mmHg. Aortic valve area, by VTI measures 1.18 cm. Pulmonic Valve: The pulmonic valve was normal in structure. Pulmonic valve regurgitation is mild. No evidence of pulmonic stenosis. Aorta: The aortic root is normal in size and structure. Venous: The inferior vena cava is dilated in size with greater than 50% respiratory variability, suggesting right atrial pressure of 8 mmHg. IAS/Shunts: No atrial level shunt detected by color flow Doppler.  LEFT VENTRICLE PLAX 2D LVIDd:         5.50 cm   Diastology LVIDs:         3.80 cm   LV e' medial:    3.07 cm/s LV PW:         1.20 cm   LV E/e' medial:  32.9 LV IVS:        1.30 cm   LV e' lateral:   4.56 cm/s LVOT diam:     2.00 cm   LV E/e' lateral: 22.1 LV SV:         76 LV SV Index:   43 LVOT Area:     3.14 cm  RIGHT VENTRICLE             IVC RV S prime:     13.40 cm/s  IVC diam: 2.30 cm TAPSE (M-mode): 1.9 cm LEFT ATRIUM             Index        RIGHT ATRIUM           Index LA diam:        5.00 cm 2.84 cm/m   RA Area:     19.90 cm LA Vol (A2C):   78.4 ml 44.56 ml/m  RA Volume:   64.95 ml  36.91 ml/m LA Vol (A4C):   89.2 ml 50.69 ml/m LA Biplane Vol: 89.0 ml 50.58 ml/m  AORTIC VALVE AV Area (Vmax):    1.24 cm AV Area (Vmean):   1.20 cm AV Area (VTI):     1.18 cm AV Vmax:           263.00 cm/s AV Vmean:          183.000 cm/s AV VTI:            0.645 m AV Peak Grad:      27.7 mmHg AV Mean Grad:      15.3 mmHg  LVOT Vmax:         103.80 cm/s LVOT Vmean:        70.133 cm/s LVOT VTI:          0.242 m LVOT/AV VTI ratio: 0.37  AORTA Ao Root diam: 3.50 cm Ao Asc diam:  3.20 cm MITRAL VALVE                TRICUSPID VALVE MV Area (PHT): 2.83 cm     TR Peak grad:   49.6 mmHg MV Decel Time: 268 msec     TR Vmax:        352.00 cm/s MV E velocity: 101.00 cm/s MV A velocity: 112.00 cm/s  SHUNTS MV E/A ratio:  0.90         Systemic VTI:  0.24 m                             Systemic Diam: 2.00 cm Jenkins Rouge MD Electronically signed  by Jenkins Rouge MD Signature Date/Time: 07/22/2022/5:15:36 PM    Final    DG Chest Port 1 View  Result Date: 07/21/2022 CLINICAL DATA:  sob EXAM: PORTABLE CHEST 1 VIEW COMPARISON:  February 04, 2022 FINDINGS: The cardiomediastinal silhouette is unchanged in contour. Small bilateral pleural effusions, similar in comparison to prior. No pneumothorax. Mild diffuse interstitial prominence and vascular congestion. Atherosclerotic calcifications. Visualized abdomen is unremarkable. IMPRESSION: Constellation of findings are favored to reflect pulmonary edema with small bilateral pleural effusions. Electronically Signed   By: Valentino Saxon M.D.   On: 07/21/2022 13:24    Cardiac Studies   TTE 07/22/2022 1. Left ventricular ejection fraction, by estimation, is 55 to 60%. The  left ventricle has normal function. The left ventricle has no regional  wall motion abnormalities. There is mild left ventricular hypertrophy.  Left ventricular diastolic parameters  are consistent with Grade I diastolic dysfunction (impaired relaxation).  Elevated left ventricular end-diastolic pressure.   2. Right ventricular systolic function is normal. The right ventricular  size is normal.   3. Left atrial size was severely dilated.   4. The mitral valve is degenerative. Trivial mitral valve regurgitation.  No evidence of mitral stenosis. Moderate mitral annular calcification.   5. Gradients similar to TTE done 12/13/21 .  The aortic valve is normal in  structure. Aortic valve regurgitation is trivial. Moderate aortic valve  stenosis.   6. The inferior vena cava is dilated in size with >50% respiratory  variability, suggesting right atrial pressure of 8 mmHg.   Patient Profile     87 y.o. male with a history of CKD stage 4, COPD (not using O2), type 2 DM, hypertension, malnutrition, retinal vein occlusion, and BPH. He is being seen today for the evaluation of chest tightness with elevated troponin   Assessment & Plan     Acute on Chronic HFpEF  - Most recent echocardiogram from 12/2021 showed EF 60-65%, no regional wall motion abnormalities, moderate LVH, grade I diastolic dysfunction  - Patient was brought to the ED from his nursing facility on 1/14 due to hypoxia. Complained of exertional dyspnea that had been going on for several days. Associated with orthopnea, NPD, chest tightness. BNP >4500. CXR with pulmonary edema and small bilateral pleural effusions  - Patient given a dose of IV lasix 80 mg overnight. Output 0.88 L urine. Creatinine 3.58 on presentation  1/14 - Patient p/w JVD, abdominal distention, crackles in lungs. IV lasix 80 mg BID was started. Strict I/Os, daily weights, daily BMPs to monitor renal function  - Note- patient is palliative care at baseline, I do not anticipate [place] pursuing aggressive therapies at this time  - can continue IV lasix - GDMT limited by renal function  - Continue metoprolol 12.5 mg BID, bidil 20/37.5 mg TID   Elevated Troponin  - hsTn 818>563  - Suspect type 2 MI-- Patient significantly volume overloaded and has renal dysfunction. Trop trend is fairly flat, not consistent with ACS  - Echo pending - Unfortunately, regardless of echo results, patient is not a candidate for cath given renal dysfunction, frailty dementia, goals of care (patient sees palliative care and is DNR)  - Continue ASA, metoprolol tartrate 12.5 mg BID, lipitor 80 mg daily   HTN  - Managed with  heart failure medications as above   CKD Stage IV - Creatinine 3.58 on presentation  - Need to closely monitor renal function with diuresis - Note, patient not a dialysis candidate   Otherwise per primary  -  Dementia  - COPD  - DM   Overall, he has delerium. His crt is 4/GFR 14 and his BUN is 83. He is headed towards uremia. He is not a dialysis candidate. Agree with palliative care. Can continue IV lasix for now, for comfort. Cardiology will follow peripherally    Time Spent Directly with Patient:  I have spent a total of 35 minutes with the patient reviewing hospital notes, telemetry, EKGs, labs and examining the patient as well as establishing an assessment and plan that was discussed personally with the patient.  > 50% of time was spent in direct patient care.  For questions or updates, please contact [place] Please consult [email or web address] for contact info under        Signed, Janina Mayo, MD  07/23/2022, 9:32 AM

## 2022-07-23 NOTE — Progress Notes (Signed)
Progress Note   Patient: Ian [name] BHA:[id number] DOB: 31-Jan-1936 DOA: 07/21/2022     2 DOS: the patient was seen and examined on 07/23/2022   Brief hospital course: Ian [name] was admitted to the hospital with the working diagnosis of decompensated heart failure in the setting or worsening rena failure.   87 yo male with the past medical history of CKD, COPD, hypertension, and dyslipidemia who presented with dyspnea, chest pain and 02 desaturation. Most history obtained from his son, because patient's cognitive impairment. Apparently patient had urinary symptom for the last 3 to 4 days, associated with confusion. His symptoms continue to progress, until the day of presentation when patient had dyspnea, bradycardia, chest tightness and low 02 saturation, EMS was called. His 02 saturation was in the 80's, he was placed on a non rebreather and was transported to the ED.  On his initial physical examination his blood pressure was 160/76, HR 61, RR 18 and 02 saturation 94%, lungs with no wheezing or rales, heart with S1 and S2 present and rhythmic, 3-4 systolic murmur, abdomen with no distention and trace bilateral lower extremity edema.   Na 141, K 4,6 CL 111, bicarbonate 20 glucose 118 bun 72 cr 3,58  BNP >4,500 High sensitive troponin 569, 668   Wbc 7,2 hgb 7,5 plt 128  Sars covid 19 negative Influenza A and B negative   Urine analysis with SG 1,010, 100 protein, 21-50 wbc, small leukocytes.   Chest radiograph with cardiomegaly, with bilateral hilar vascular congestion and small bilateral pleural effusions, more notable on the left.   EKG 59 bpm, normal axis, normal intervals, sinus rhythm with poor R R wave progression, with no significant ST segment or T wave changes, positive LVH.   Assessment and Plan: * Acute on chronic diastolic (congestive) heart failure (HCC) Echocardiogram from 2023 with preserved LV systolic function and mild aortic stenosis.   Clinically feeling better.   Documented urine output is 9,735 ml Systolic blood pressure is 114 to 120  Symptoms have improved.   After load reduction with hydralazine/ isosorbide combination Hold on metoprolol due to bradycardia, continue telemetry monitoring.  Limited pharmacological therapy due to low GFR Hold on furosemide for now.   Acute kidney injury superimposed on chronic kidney disease ([place]) CKD stage 4   Patient with improvement in volume status, renal function with serum cr at 4,0 with K at 5,0 and serum bicarbonate at 26.  Hold furosemide and follow up renal function in am. Patient is not candidate for renal replacement therapy due to heart failure and poor functional status.  Cognitive impairment.  Avoid hypotension and nephrotoxic medications   COPD (chronic obstructive pulmonary disease) (HCC) No clinical signs of exacerbation,  Continue oxymetry monitoring and bronchodilator therapy. Keep 02 saturation 88% or greater.   Type 2 diabetes mellitus with hyperlipidemia (HCC) Continue glucose cover and monitoring with insulin sliding scale.   DNR (do not resuscitate) DNR  UTI (urinary tract infection) Positive pyuria and urinary symptoms, will continue antibiotic therapy with amoxicillin.  Urine culture positive for enterococcus, in the past has been sensitive to ampicillin.   Anemia Thrombocytopenia  Hgb is 8.1  and plt 142 Iron panel with serum iron 15, transferrin saturation 6, ferritin 146 and TIBC 267, consistent with anemia of iron deficiency combined with anemia of chronic disease. Will add IV iron infusion. No current indication for PRBC transfusion.  Follow cell count in am.         Subjective: Patient is feeling better  with dyspnea, no chest pain.   Physical Exam: Vitals:   07/23/22 0849 07/23/22 1105 07/23/22 2011 07/23/22 2102  BP:  (!) 108/43 120/72   Pulse:  63    Resp:  20    Temp:  97.9 F (36.6 C) 98.1 F (36.7 C)   TempSrc:  Oral Oral   SpO2: 95% 92% 96%  93%  Weight:       Neurology awake and alert ENT with pallor with no icterus Cardiovascular with S1 and S2 present and rhythmic with no gallops, rubs or murmurs Respiratory with no rales or wheezing Abdomen with no distention No lower extremity edema  Data Reviewed:    Family Communication: no family at the bedside   Disposition: Status is: Inpatient Remains inpatient appropriate because: heart failure and renal failure   Planned Discharge Destination: Home     Author: Tawni Millers, MD 07/23/2022 11:10 PM  For on call review [email or web address].

## 2022-07-23 NOTE — Progress Notes (Signed)
Initial Nutrition Assessment  DOCUMENTATION CODES:  Non-severe (moderate) malnutrition in context of chronic illness  INTERVENTION:  Liberalize diet from a heart healthy/carb modified to a 2g sodium diet to provide widest variety of menu options to enhance nutritional adequacy Ensure Enlive po BID, each supplement provides 350 kcal and 20 grams of protein. MVI with minerals daily  NUTRITION DIAGNOSIS:  Moderate Malnutrition related to chronic illness (HF, COPD, CKD 4) as evidenced by mild fat depletion, severe muscle depletion, moderate muscle depletion.  GOAL:  Patient will meet greater than or equal to 90% of their needs  MONITOR:  PO intake, Supplement acceptance, Labs, I & O's, Weight trends  REASON FOR ASSESSMENT:  Consult Assessment of nutrition requirement/status  ASSESSMENT:  Pt admitted with decompensated HF in the setting of worsening renal failure. PMH significant for CKD stage 4, COPD, T2DM, HTN and dyslipidemia.   Urinary symptoms for 3-4 days, associated with confusion. Symptoms continue to progress. Flowsheet documentation reflects pt to be oriented x2.   Pt is very HOH. He had 1 hearing aid in but reports that it is not working today. Communicated via written communication, however this allowed for limited nutrition related history. He states that he ate well for breakfast today.   Meal completions: 1/16: 75% breakfast, 25% lunch  Reviewed weight history. Pt's weight is noted to have been declining since 12/26/21. His weight at that time was 77.6 kg. His current weight is documented to be 68.9 kg. This is a weight loss of 11.2% which is concerning.   Medications: colace, lasix 80mg  BID, SSI 0-9 units TID, melatonin, remeron, miralax, senna  Labs: BUN 83, Cr 4.08, GFR 14, HgbA1c 5.5% (01/2022)  CBG's 150, 142, 151, 110, 185, 104  UOP: 1.1L x24 hours I/O's: -1625ml since admit  NUTRITION - FOCUSED PHYSICAL EXAM: Flowsheet Row Most Recent Value  Orbital  Region Mild depletion  Upper Arm Region Severe depletion  Thoracic and Lumbar Region Mild depletion  Buccal Region Moderate depletion  Temple Region Mild depletion  Clavicle Bone Region Moderate depletion  Clavicle and Acromion Bone Region Moderate depletion  Scapular Bone Region Mild depletion  Dorsal Hand Moderate depletion  Patellar Region Severe depletion  Anterior Thigh Region Severe depletion  Posterior Calf Region Moderate depletion  Edema (RD Assessment) None  Hair Reviewed  Eyes Reviewed  Mouth Reviewed  Skin Reviewed  Nails Reviewed      Diet Order:   Diet Order             Diet 2 gram sodium Room service appropriate? Yes; Fluid consistency: Thin; Fluid restriction: 1500 mL Fluid  Diet effective now                  EDUCATION NEEDS:  Not appropriate for education at this time  Skin:  Skin Assessment: Reviewed RN Assessment  Last BM:  PTA  Height:  Ht Readings from Last 1 Encounters:  06/24/22 5\' 6"  (1.676 m)   Weight:  Wt Readings from Last 1 Encounters:  07/23/22 68.9 kg   BMI:  Body mass index is 24.52 kg/m.  Estimated Nutritional Needs:  Kcal:  1700-1900 Protein:  90-105g Fluid:  >/=1.7L  Clayborne Dana, RDN, LDN Clinical Nutrition

## 2022-07-23 NOTE — Evaluation (Signed)
Occupational Therapy Evaluation Patient Details Name: Ian [name] MRN: [id number] DOB: 1936-05-24 Today's Date: 07/23/2022   History of Present Illness Patient is a 87 y/o male who presents from [facility] SNF on 1/14 with hypoxia, bradycardia, SOB and CP. Found to have acute on chronic CHF and MI. PMH includes dementia, CKD stage V, HTN, depression, COPD, chronic anemia, chronic diastolic HF, DM.   Clinical Impression   Pt questionable historian, from [facility], reports "sometimes" using having assist for ADLs and using RW for mobility. Pt currently needing min guard-mod A for ADLs and min A for transfers with RW, pt impulsive, needing min cues for safety during session. SpO2 down to 88% on 3L O2, increasing to 90's once seated in chair. Pt presenting with impairments listed below, will follow acutely. Recommend SNF at d/c.      Recommendations for follow up therapy are one component of a multi-disciplinary discharge planning process, led by the attending physician.  Recommendations may be updated based on patient status, additional functional criteria and insurance authorization.   Follow Up Recommendations  Skilled nursing-short term rehab (<3 hours/day)     Assistance Recommended at Discharge Frequent or constant Supervision/Assistance  Patient can return home with the following A little help with walking and/or transfers;A lot of help with bathing/dressing/bathroom;Assistance with cooking/housework;Direct supervision/assist for medications management;Direct supervision/assist for financial management;Assist for transportation;Help with stairs or ramp for entrance    Functional Status Assessment  Patient has had a recent decline in their functional status and demonstrates the ability to make significant improvements in function in a reasonable and predictable amount of time.  Equipment Recommendations  Other (comment) (defer)    Recommendations for Other Services PT consult      Precautions / Restrictions Precautions Precautions: Fall Restrictions Weight Bearing Restrictions: No      Mobility Bed Mobility               General bed mobility comments: pt up in chair upon arrival and departure    Transfers Overall transfer level: Needs assistance Equipment used: Rolling walker (2 wheels) Transfers: Sit to/from Stand, Bed to chair/wheelchair/BSC Sit to Stand: Min assist           General transfer comment: short distance ambulation in room with x1 seated rest break      Balance Overall balance assessment: Needs assistance Sitting-balance support: No upper extremity supported, Feet unsupported Sitting balance-Leahy Scale: Fair Sitting balance - Comments: close Min guard for safety.   Standing balance support: During functional activity Standing balance-Leahy Scale: Poor Standing balance comment: Relies on external support, flexed posture                           ADL either performed or assessed with clinical judgement   ADL Overall ADL's : Needs assistance/impaired Eating/Feeding: Modified independent   Grooming: Min guard   Upper Body Bathing: Moderate assistance   Lower Body Bathing: Moderate assistance   Upper Body Dressing : Moderate assistance   Lower Body Dressing: Moderate assistance Lower Body Dressing Details (indicate cue type and reason): increased difficulty donning vs doffing sock Toilet Transfer: Minimal assistance;Ambulation;Rolling walker (2 wheels);Regular Glass blower/designer Details (indicate cue type and reason): simulated via functional mobility Toileting- Clothing Manipulation and Hygiene: Moderate assistance       Functional mobility during ADLs: Minimal assistance;Rolling walker (2 wheels)       Vision Baseline Vision/History: 1 Wears glasses Vision Assessment?: No apparent visual deficits  Perception Perception Perception Tested?: No   Praxis Praxis Praxis tested?: Not tested     Pertinent Vitals/Pain Pain Assessment Pain Assessment: Faces Pain Score: 2  Faces Pain Scale: Hurts a little bit Pain Location: back of neck Pain Descriptors / Indicators: Discomfort Pain Intervention(s): Limited activity within patient's tolerance, Monitored during session, Repositioned     Hand Dominance Right   Extremity/Trunk Assessment Upper Extremity Assessment Upper Extremity Assessment: Generalized weakness   Lower Extremity Assessment Lower Extremity Assessment: Defer to PT evaluation   Cervical / Trunk Assessment Cervical / Trunk Assessment: Kyphotic   Communication Communication Communication: HOH   Cognition Arousal/Alertness: Awake/alert Behavior During Therapy: WFL for tasks assessed/performed Overall Cognitive Status: History of cognitive impairments - at baseline                                 General Comments: hx of dementia, A & O x4, follows commands appropriately with repetition     General Comments  SpO2 down to 88% on 3L O2 with mobility in room, improved to 90's once seated    Exercises     Shoulder Instructions      Home Living Family/patient expects to be discharged to:: Skilled nursing facility [facility])                                 Additional Comments: wears 4L O2 baseline per pt      Prior Functioning/Environment               Mobility Comments: reports he ambulates with and without use of RW, reports falling at [place] ADLs Comments: reports he occasionally has help for ADLs        OT Problem List: Decreased strength;Decreased range of motion;Decreased activity tolerance;Impaired balance (sitting and/or standing);Decreased safety awareness;Decreased cognition;Cardiopulmonary status limiting activity      OT Treatment/Interventions: Self-care/ADL training;Therapeutic exercise;Energy conservation;DME and/or AE instruction;Therapeutic activities;Patient/family education;Balance training     OT Goals(Current goals can be found in the care plan section) Acute Rehab OT Goals Patient Stated Goal: none stated OT Goal Formulation: With patient Time For Goal Achievement: 08/06/22 Potential to Achieve Goals: Good ADL Goals Pt Will Perform Upper Body Dressing: with min assist;sitting Pt Will Perform Lower Body Dressing: with min assist;sitting/lateral leans;sit to/from stand Pt Will Transfer to Toilet: with supervision;ambulating;regular height toilet  OT Frequency: Min 2X/week    Co-evaluation              AM-PAC OT "6 Clicks" Daily Activity     Outcome Measure Help from another person eating meals?: None Help from another person taking care of personal grooming?: A Little Help from another person toileting, which includes using toliet, bedpan, or urinal?: A Lot Help from another person bathing (including washing, rinsing, drying)?: A Lot Help from another person to put on and taking off regular upper body clothing?: A Lot Help from another person to put on and taking off regular lower body clothing?: A Lot 6 Click Score: 15   End of Session Equipment Utilized During Treatment: Gait belt;Rolling walker (2 wheels);Oxygen (3L) Nurse Communication: Mobility status;Need for lift equipment  Activity Tolerance: Patient tolerated treatment well Patient left: in chair;with call bell/phone within reach;with chair alarm set;with nursing/sitter in room  OT Visit Diagnosis: Unsteadiness on feet (R26.81);Other abnormalities of gait and mobility (R26.89);Muscle weakness (generalized) (M62.81);History of falling (Z91.81)  Time: 2505-3976 OT Time Calculation (min): 21 min Charges:  OT General Charges $OT Visit: 1 Visit OT Evaluation $OT Eval Moderate Complexity: 1 Mod  Dezmond Downie K, OTD, OTR/L SecureChat Preferred Acute Rehab [phone]   Renaye Rakers Koonce 07/23/2022, 3:50 PM

## 2022-07-23 NOTE — TOC Progression Note (Signed)
Transition of Care [facility]) - Progression Note    Patient Details  Name: Ian [name] MRN: [id number] Date of Birth: 1935-08-11  Transition of [facility]) CM/SW Contact  Zenon Mayo, RN Phone Number: 07/23/2022, 8:01 AM  Clinical Narrative:     from home, acute/chronic CHF, conts on IV lasix 80mg  bid.  TOC following.       Expected Discharge Plan and Services                                               Social Determinants of Health (SDOH) Interventions SDOH Screenings   Food Insecurity: No Food Insecurity (07/02/2017)  Transportation Needs: No Transportation Needs (07/02/2017)  Alcohol Screen: Low Risk  (07/16/2018)  Depression (PHQ2-9): Low Risk  (01/11/2022)  Financial Resource Strain: Low Risk  (07/02/2017)  Physical Activity: Unknown (07/06/2018)  Social Connections: Socially Integrated (07/02/2017)  Stress: No Stress Concern Present (07/02/2017)  Tobacco Use: Medium Risk (07/21/2022)    Readmission Risk Interventions     No data to display

## 2022-07-23 NOTE — Evaluation (Signed)
Physical Therapy Evaluation Patient Details Name: Ian [name] MRN: [id number] DOB: 1936-06-30 Today's Date: 07/23/2022  History of Present Illness  Patient is a 87 y/o male who presents from [facility] on 1/14 with hypoxia, bradycardia, SOB and CP. Found to have acute on chronic CHF and MI. PMH includes dementia, CKD stage V, HTN, depression, COPD, chronic anemia, chronic diastolic HF, DM.  Clinical Impression  Patient presents with generalized weakness, lethargy, hx of cognitive deficits, impaired balance and impaired mobility s/p above. Pt's hearing aid batteries were dead so having difficulty communicating this session. Pt also lethargic and sleep after just having been woken up so not able to read written communication well either. Pt is from [facility] per chart review so unsure of PLOF/mobility. Son to bring hearing aid batteries later today. Requires max A for bed mobility and Mod A to stand from EOB; able to take a few steps to get to chair with Min A in a flexed posture. Will further assess ambulation/mobility next session with hearing aids/walker and possibly second person for safety after determining PLOF. VSS on supplemental 02. Would benefit from return to SNF to maximize independence and mobility. Will follow acutely.       Recommendations for follow up therapy are one component of a multi-disciplinary discharge planning process, led by the attending physician.  Recommendations may be updated based on patient status, additional functional criteria and insurance authorization.  Follow Up Recommendations Skilled nursing-short term rehab (<3 hours/day) (return to [place]) Can patient physically be transported by private vehicle: No    Assistance Recommended at Discharge Frequent or constant Supervision/Assistance  Patient can return home with the following  Two people to help with walking and/or transfers;A lot of help with bathing/dressing/bathroom;Direct supervision/assist  for medications management;Direct supervision/assist for financial management;Assist for transportation    Equipment Recommendations None recommended by PT  Recommendations for Other Services       Functional Status Assessment Patient has had a recent decline in their functional status and demonstrates the ability to make significant improvements in function in a reasonable and predictable amount of time.     Precautions / Restrictions Precautions Precautions: Fall Restrictions Weight Bearing Restrictions: No      Mobility  Bed Mobility Overal bed mobility: Needs Assistance Bed Mobility: Supine to Sit     Supine to sit: Max assist, HOB elevated     General bed mobility comments: Needs max cues to initiate bed mobility., assist with LEs, trunk and scooting bottom to EOB, not sure if this related to cognition/lethargy or communication difficulties.    Transfers Overall transfer level: Needs assistance Equipment used: 1 person hand held assist Transfers: Sit to/from Stand, Bed to chair/wheelchair/BSC Sit to Stand: Mod assist   Step pivot transfers: Min assist       General transfer comment: Mod A to power to standing with assist for forward weight shift, flexed posture. Able to take a few steps tog et to chair with Min A for balance. Deferred further walking due to breakfast arrived and did not have second person for safety    Ambulation/Gait               General Gait Details: Deferred  Stairs            Wheelchair Mobility    Modified Rankin (Stroke Patients Only)       Balance Overall balance assessment: Needs assistance Sitting-balance support: No upper extremity supported, Feet unsupported Sitting balance-Leahy Scale: Fair Sitting balance - Comments:  close Min guard for safety.   Standing balance support: During functional activity Standing balance-Leahy Scale: Poor Standing balance comment: Relies on external support, flexed posture                              Pertinent Vitals/Pain Pain Assessment Pain Assessment: Faces Faces Pain Scale: No hurt    Home Living Family/patient expects to be discharged to:: Skilled nursing facility [facility])                        Prior Function Prior Level of Function : Patient poor historian/Family not available                     Hand Dominance   Dominant Hand: Right    Extremity/Trunk Assessment   Upper Extremity Assessment Upper Extremity Assessment: Defer to OT evaluation (Kept BUEs in flexed posture for part of session with limited functional use to get to EOB)    Lower Extremity Assessment Lower Extremity Assessment: Generalized weakness    Cervical / Trunk Assessment Cervical / Trunk Assessment: Kyphotic  Communication   Communication: HOH (not able to hear anything today due to hearing aid batteries broken, difficulty reading/understanding written words as well this morning)  Cognition Arousal/Alertness: Lethargic Behavior During Therapy: Flat affect Overall Cognitive Status: Difficult to assess                                 General Comments: Son to bring hearing aids later today, relayed info to OT.  Able to follow some gestural commands but otherwise difficult to communicate. Hx of dementia as well        General Comments General comments (skin integrity, edema, etc.): VSS on 02.    Exercises     Assessment/Plan    PT Assessment Patient needs continued PT services  PT Problem List Decreased strength;Decreased mobility;Decreased safety awareness;Decreased balance;Decreased cognition       PT Treatment Interventions Therapeutic activities;Gait training;Therapeutic exercise;Patient/family education;Balance training;Functional mobility training    PT Goals (Current goals can be found in the Care Plan section)  Acute Rehab PT Goals Patient Stated Goal: none stated PT Goal Formulation: Patient unable to  participate in goal setting Time For Goal Achievement: 08/06/22 Potential to Achieve Goals: Fair    Frequency Min 2X/week     Co-evaluation               AM-PAC PT "6 Clicks" Mobility  Outcome Measure Help needed turning from your back to your side while in a flat bed without using bedrails?: A Lot Help needed moving from lying on your back to sitting on the side of a flat bed without using bedrails?: Total Help needed moving to and from a bed to a chair (including a wheelchair)?: A Little Help needed standing up from a chair using your arms (e.g., wheelchair or bedside chair)?: A Lot Help needed to walk in hospital room?: Total Help needed climbing 3-5 steps with a railing? : Total 6 Click Score: 10    End of Session Equipment Utilized During Treatment: Gait belt Activity Tolerance: Patient limited by lethargy;Other (comment) (inability to hear/communicate well/cognition) Patient left: in chair;with call bell/phone within reach;with chair alarm set Nurse Communication: Mobility status PT Visit Diagnosis: Difficulty in walking, not elsewhere classified (R26.2);Unsteadiness on feet (R26.81);Muscle weakness (generalized) (M62.81)  Time: 5176-1607 PT Time Calculation (min) (ACUTE ONLY): 16 min   Charges:   PT Evaluation $PT Eval Moderate Complexity: 1 Mod          Marisa Severin, PT, DPT Acute Rehabilitation Services Secure chat preferred Office [place] 07/23/2022, 9:03 AM

## 2022-07-24 ENCOUNTER — Telehealth: Payer: Self-pay | Admitting: Family Medicine

## 2022-07-24 DIAGNOSIS — I509 Heart failure, unspecified: Secondary | ICD-10-CM | POA: Diagnosis not present

## 2022-07-24 DIAGNOSIS — R7989 Other specified abnormal findings of blood chemistry: Secondary | ICD-10-CM | POA: Diagnosis not present

## 2022-07-24 DIAGNOSIS — Z515 Encounter for palliative care: Secondary | ICD-10-CM | POA: Diagnosis not present

## 2022-07-24 DIAGNOSIS — I5033 Acute on chronic diastolic (congestive) heart failure: Secondary | ICD-10-CM | POA: Diagnosis not present

## 2022-07-24 LAB — GLUCOSE, CAPILLARY
Glucose-Capillary: 129 mg/dL — ABNORMAL HIGH (ref 70–99)
Glucose-Capillary: 121 mg/dL — ABNORMAL HIGH (ref 70–99)
Glucose-Capillary: 109 mg/dL — ABNORMAL HIGH (ref 70–99)

## 2022-07-24 LAB — BASIC METABOLIC PANEL
Anion gap: 9 (ref 5–15)
BUN: 86 mg/dL — ABNORMAL HIGH (ref 8–23)
CO2: 25 mmol/L (ref 22–32)
Calcium: 9.2 mg/dL (ref 8.9–10.3)
Chloride: 103 mmol/L (ref 98–111)
Creatinine, Ser: 4.55 mg/dL — ABNORMAL HIGH (ref 0.61–1.24)
GFR, Estimated: 12 mL/min — ABNORMAL LOW (ref >60)
Glucose, Bld: 107 mg/dL — ABNORMAL HIGH (ref 70–99)
Potassium: 5.3 mmol/L — ABNORMAL HIGH (ref 3.5–5.1)
Sodium: 137 mmol/L (ref 135–145)

## 2022-07-24 MED ORDER — LORAZEPAM 1 MG PO TABS
1.0000 mg | ORAL_TABLET | ORAL | Status: DC | PRN
  Administered 2022-07-25: 1 mg via ORAL
  Filled 2022-07-24: qty 1

## 2022-07-24 MED ORDER — HALOPERIDOL 1 MG PO TABS: 2.0000 mg | ORAL_TABLET | Freq: Four times a day (QID) | ORAL | Status: DC | PRN

## 2022-07-24 MED ORDER — GLYCOPYRROLATE 0.2 MG/ML IJ SOLN: 0.2000 mg | INTRAMUSCULAR | Status: DC | PRN

## 2022-07-24 MED ORDER — ALBUMIN HUMAN 25 % IV SOLN
25.0000 g | Freq: Once | INTRAVENOUS | Status: AC
  Administered 2022-07-24: 25 g via INTRAVENOUS
  Filled 2022-07-24: qty 100

## 2022-07-24 MED ORDER — HALOPERIDOL LACTATE 2 MG/ML PO CONC: 2.0000 mg | Freq: Four times a day (QID) | ORAL | Status: DC | PRN

## 2022-07-24 MED ORDER — BIOTENE DRY MOUTH MT LIQD
15.0000 mL | Freq: Two times a day (BID) | OROMUCOSAL | Status: DC
  Administered 2022-07-24 – 2022-07-25 (×2): 15 mL via TOPICAL

## 2022-07-24 MED ORDER — POLYVINYL ALCOHOL 1.4 % OP SOLN: 1.0000 [drp] | Freq: Four times a day (QID) | OPHTHALMIC | Status: DC | PRN

## 2022-07-24 MED ORDER — HYDROMORPHONE HCL 1 MG/ML PO LIQD
1.0000 mg | ORAL | Status: DC | PRN
  Administered 2022-07-25: 1 mg via ORAL
  Filled 2022-07-24: qty 1

## 2022-07-24 MED ORDER — DIPHENHYDRAMINE HCL 50 MG/ML IJ SOLN: 12.5000 mg | INTRAMUSCULAR | Status: DC | PRN

## 2022-07-24 MED ORDER — GLYCOPYRROLATE 1 MG PO TABS: 1.0000 mg | ORAL_TABLET | ORAL | Status: DC | PRN

## 2022-07-24 MED ORDER — LORAZEPAM 2 MG/ML PO CONC: 1.0000 mg | ORAL | Status: DC | PRN

## 2022-07-24 MED ORDER — HALOPERIDOL LACTATE 5 MG/ML IJ SOLN: 2.0000 mg | Freq: Four times a day (QID) | INTRAMUSCULAR | Status: DC | PRN

## 2022-07-24 NOTE — Progress Notes (Addendum)
Daily Progress Note   Patient Name: Ian [name]       Date: 07/24/2022 DOB: 10-31-1935  Age: 87 y.o. MRN#: [id number] Attending Physician: Domenic Polite, MD Primary Care Physician: Lauree Chandler, NP Admit Date: 07/21/2022  Reason for Consultation/Follow-up: Establishing goals of care  Subjective: I have reviewed medical records including EPIC notes and labs.  Noted patient was hypotensive overnight requiring administration of albumin.  Noted creatinine continues to rise.  Received report from primary RN -no acute concerns.  RN reports patient did not eat breakfast as he has been minimally responsive.  Went to visit patient at bedside - no family/visitors present.  Patient was lying in bed asleep - he does wake to voice/gentle touch; however is not verbally responsive.  His clinical condition seems to have declined from yesterday. He is on 5L O2 [place].  Called son/Mike - emotional support provided.  Reviewed patient's interval history since admission. We discussed patient's current illness and what it means in the larger context of patient's on-going co-morbidities.  Natural disease trajectory and expectations at EOL were discussed. I attempted to elicit values and goals of care important to the patient. The difference between aggressive medical intervention and comfort care was considered in light of the patient's goals of care.  Education provided on CHF treatment in relation to CKD.  Reviewed patient worsening kidney function and that he would not be a dialysis candidate.  Also reviewed the cardiology feels he is not a candidate for invasive/advanced therapies and their recommendation for hospice.  Allowed space and time for [name] to process information.  He tells me that he was "not expecting this  news today;" however, he is not surprised as the patient over the last several months has told him that he "does not want to live anymore" and "I do not want to be here anymore."  [name] tells me the patient has "gone downhill fast" over the last 4 to 5 months.  Therapeutic listening provided as [name] describes this conversation with other members of the healthcare team regarding rehab. Discussed that the goal of rehab is improvement/stabalization of functional status,  which can be a difficult goal to meet for patients with advanced illness and multiple medical conditions. Reviewed what is needed for someone to have a positive rehabilitation experience to include adequate nutritional intake as  well as willingness/ability to participate.  Reviewed that patient does not not meet these basic guidelines with continued clinical decline, and would likely find no benefit at rehab as it is anticipated he will continue to decline -[name] agrees agrees and is not interested in rehab.  Provided education and counseling at length on the philosophy and benefits of hospice care. Discussed that it offers a holistic approach to care in the setting of end-stage illness, and is about supporting the patient where they are allowing nature to take it's course. Discussed the hospice team includes RNs, physicians, social workers, and chaplains. They can provide personal care, support for the family, and help keep patient out of the hospital as well as assist with DME needs for home hospice. Education provided on the difference between home vs residential hospice.  After consideration, [name] would prefer patient to return to [facility] with hospice if they can accommodate.  If not, he is interested in residential hospice.  We talked about transition to comfort measures in house and what that would entail inclusive of medications to control pain, dyspnea, agitation, nausea, and itching. We discussed stopping all unnecessary measures such as  blood draws, needle sticks, oxygen, antibiotics, CBGs/insulin, cardiac monitoring, IVF, and frequent vital signs. Education provided that other non-pharmacological interventions would be utilized for holistic support and comfort such as spiritual support if requested, repositioning, music therapy, offering comfort feeds, and/or therapeutic listening.  [name] is agreeable for patient's transition to full comfort measures today.  He tells me, "I want him to be happy and comfortable." Prognostication reviewed.  [name] requests to call his brother to add him to the call to discuss information as outlined above. Attempted to call Catalina Antigua - he unfortunately did not answer for myself or [name]. Informed [name] that if Catalina Antigua wished to discuss further with PMT, to share PMT number and I would be happy to speak with him.  All questions and concerns addressed. Encouraged to call with questions and/or concerns. PMT number provided.   Length of Stay: 3  Current Medications: Scheduled Meds:   acetaminophen  500 mg Oral BID   amoxicillin  250 mg Oral Q12H   aspirin EC  81 mg Oral Daily   atorvastatin  80 mg Oral QPM   docusate sodium  100 mg Oral BID   enoxaparin (LOVENOX) injection  30 mg Subcutaneous Q24H   feeding supplement  237 mL Oral BID BM   fluticasone  1 spray Each Nare Daily   gatifloxacin  1 drop Right Eye BID   insulin aspart  0-9 Units Subcutaneous TID WC   loratadine  10 mg Oral Daily   melatonin  1.5 mg Oral QHS   mirtazapine  7.5 mg Oral QHS   mometasone-formoterol  2 puff Inhalation BID   montelukast  10 mg Oral QHS   multivitamin with minerals  1 tablet Oral Daily   polyethylene glycol  17 g Oral Daily   senna-docusate  1 tablet Oral BID   sertraline  50 mg Oral Daily   sodium chloride flush  3 mL Intravenous Q12H    Continuous Infusions:  ferumoxytol (FERAHEME) 510 mg in sodium chloride 0.9 % 100 mL IVPB      PRN Meds: acetaminophen **OR** acetaminophen, albuterol, bisacodyl,  guaiFENesin, ondansetron **OR** ondansetron (ZOFRAN) IV, oxyCODONE  Physical Exam Vitals and nursing note reviewed.  Constitutional:      General: He is not in acute distress.    Appearance: He is cachectic. He is ill-appearing.  Pulmonary:  Effort: No respiratory distress.  Skin:    General: Skin is warm and dry.  Neurological:     Mental Status: He is lethargic.     Motor: Weakness present.  Psychiatric:        Speech: He is noncommunicative.             Vital Signs: BP (!) 80/42 (BP Location: Left Arm)   Pulse 60   Temp 98.8 F (37.1 C) (Oral)   Resp 20   Wt 68.9 kg   SpO2 99%   BMI 24.52 kg/m  SpO2: SpO2: 99 % O2 Device: O2 Device: Nasal Cannula O2 Flow Rate: O2 Flow Rate (L/min): 5 L/min  Intake/output summary:  Intake/Output Summary (Last 24 hours) at 07/24/2022 1210 Last data filed at 07/24/2022 6226 Gross per 24 hour  Intake 600 ml  Output 200 ml  Net 400 ml   LBM: Last BM Date : 07/18/22 Baseline Weight: Weight: 68.9 kg Most recent weight: Weight: 68.9 kg       Palliative Assessment/Data: PPS 10%      Patient Active Problem List   Diagnosis Date Noted   Malnutrition of moderate degree 07/23/2022   UTI (urinary tract infection) 07/22/2022   Anemia 07/22/2022   Acute on chronic diastolic (congestive) heart failure ([place]) 07/21/2022   Elevated troponin 07/21/2022   DNR (do not resuscitate) 07/21/2022   Underweight due to inadequate caloric intake 06/28/2022   Fever 06/28/2022   Pleural effusion 02/27/2022   CAP (community acquired pneumonia) 12/07/2021   Acute renal failure superimposed on stage 4 chronic kidney disease ([place]) 33/35/4562   Acute metabolic encephalopathy 56/38/9373   Anemia, unspecified    Nasal congestion 12/18/2018   Depression with anxiety 12/18/2018   Chronic diastolic CHF (congestive heart failure) ([place]) 04/07/2018   HTN (hypertension) 04/07/2018   Chronic right-sided low back pain with right-sided sciatica 10/06/2017    Mild cognitive impairment with memory loss 11/04/2016   Chronic kidney disease (CKD) stage G3a/A2, moderately decreased glomerular filtration rate (GFR) between 45-59 mL/min/1.73 square meter and albuminuria creatinine ratio between 30-299 mg/g ([place]) 11/23/2015   Weight loss 11/23/2015   Obesity (BMI 30-39.9) 02/03/2014   Acute kidney injury superimposed on chronic kidney disease ([place]) 10/20/2013   Type 2 diabetes mellitus with hyperlipidemia ([place]) 09/17/2013   Insomnia 01/07/2013   DM2 (diabetes mellitus, type 2) ([place]) 12/29/2012   Hypertension associated with diabetes ([place])    COPD (chronic obstructive pulmonary disease) ([place])    Hyperlipidemia    Degenerative drusen 07/07/2012   COPD with asthma ([place]) 12/03/2011   Allergic rhinitis 12/03/2011   Branch retinal vein occlusion 06/28/2011   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    Palliative Care Assessment & Plan   Patient Profile: 87 y.o. male  with past medical history of dementia, stage 3b/4 CKD, COPD on 2L home O2, DM, HTN, HLD, BPH, and OSA  admitted on 07/21/2022 with SOB.    Patient is followed by outpatient palliative care and currently admitted for acute on chronic CHF. PMT has been consulted to assist with goals of care conversation.  Assessment: Principal Problem:   Acute on chronic diastolic (congestive) heart failure (HCC) Active Problems:   COPD (chronic obstructive pulmonary disease) (HCC)   Type 2 diabetes mellitus with hyperlipidemia (HCC)   Acute kidney injury superimposed on chronic kidney disease (HCC)   HTN (hypertension)   DNR (do not resuscitate)   UTI (urinary tract infection)   Anemia   Malnutrition of moderate degree   Terminal care  Recommendations/Plan: Initiated full comfort measures Continue DNR/DNI as previously documented Goal is for patient to return to [place] with hospice if they can accommodate. If not, family interested in residential hospice placement - [facility] consult placed and notified Added  orders for EOL symptom management and to reflect full comfort measures, as well as discontinued orders that were not focused on comfort Unrestricted visitation orders were placed per current [place] EOL visitation policy  Nursing to provide frequent assessments and administer PRN medications as clinically necessary to ensure EOL comfort PMT will continue to follow and support holistically  Symptom Management Dilaudid PRN pain/dyspnea/increased work of breathing/RR>25 Tylenol PRN pain/fever Biotin twice daily Benadryl PRN itching Robinul PRN secretions Haldol PRN agitation/delirium Ativan PRN anxiety/seizure/sleep/distress Zofran PRN nausea/vomiting Liquifilm Tears PRN dry eye Continue gatifloxacin opth drops daily    Goals of Care and Additional Recommendations: Limitations on Scope of Treatment: Full Comfort Care  Code Status:    Code Status Orders  (From admission, onward)           Start     Ordered   07/21/22 1740  Do not attempt resuscitation (DNR)  Continuous       Question Answer Comment  If patient has no pulse and is not breathing Do Not Attempt Resuscitation   If patient has a pulse and/or is breathing: Medical Treatment Goals LIMITED ADDITIONAL INTERVENTIONS: Use medication/IV fluids and cardiac monitoring as indicated; Do not use intubation or mechanical ventilation (DNI), also provide comfort medications.  Transfer to Progressive/Stepdown as indicated, avoid Intensive Care.   Consent: Discussion documented in EHR or advanced directives reviewed      07/21/22 1739           Code Status History     Date Active Date Inactive Code Status Order ID Comments User Context   06/24/2022 1343 07/21/2022 1239 DNR [id number]  Lauree Chandler, NP Outpatient   12/07/2021 2221 12/14/2021 1810 DNR [id number]  Rhetta Mura, DO Inpatient   05/27/2019 1354 12/07/2021 1430 DNR [id number] Discussed with patient in past, order being reentered Gayland Curry  Outpatient   04/07/2018 0322 04/09/2018 1422 Full Code [id number]  Ivor Costa, MD ED   12/31/2016 0148 01/06/2017 1819 DNR [id number]  Etta Quill, DO ED   09/17/2013 0859 02/05/2016 1018 DNR [id number] No cpr, defibrillation, intubation, mechanical ventilation Gayland Curry Outpatient   12/29/2012 0054 01/04/2013 2032 Full Code [id number]  Rise Patience, MD Inpatient       Prognosis:  < 2 weeks  Discharge Planning: [place] with Hospice  Care plan was discussed with primary RN, patient's son/Mike, Dr. Broadus Kiyan, [facility]  Thank you for allowing the Palliative Medicine Team to assist in the care of this patient.   Total Time 90 minutes Prolonged Time Billed  yes       Greater than 50%  of this time was spent counseling and coordinating care related to the above assessment and plan.  Lin Landsman, NP  Please contact Palliative Medicine Team phone at [phone] for questions and concerns.   *Portions of this note are a verbal dictation therefore any spelling and/or grammatical errors are due to the "[employer]" system interpretation.

## 2022-07-24 NOTE — Progress Notes (Signed)
Pt's pressures not really improving on albumin. Highest pressure seen 90/45 and most recent manual 80/38. Opyd, MD made aware and second round of albumin ordered. Pt has no complaints and is pleasantly confused.

## 2022-07-24 NOTE — Progress Notes (Signed)
Pt BP 72/42 manual. On-call MD made aware and is placing an albumin order.

## 2022-07-24 NOTE — NC FL2 (Signed)
[place] LEVEL OF CARE FORM     IDENTIFICATION  Patient Name: Ian [name] Birthdate: [birth year] Sex: male Admission Date (Current Location): 07/21/2022  [facility] and [place] Number:  Herbalist and Address:  [facility], [place] [address] [place], [place] [address]      Provider Number: [id number]  Attending Physician Name and Address:  Domenic Polite, MD  Relative Name and Phone Number:  [name] [phone])    Current Level of Care: Hospital Recommended Level of Care: [place] Prior Approval Number:    Date Approved/Denied:   PASRR Number: [id number]  Discharge Plan: SNF    Current Diagnoses: Patient Active Problem List   Diagnosis Date Noted   Malnutrition of moderate degree 07/23/2022   UTI (urinary tract infection) 07/22/2022   Anemia 07/22/2022   Acute on chronic diastolic (congestive) heart failure ([place]) 07/21/2022   Elevated troponin 07/21/2022   DNR (do not resuscitate) 07/21/2022   Underweight due to inadequate caloric intake 06/28/2022   Fever 06/28/2022   Pleural effusion 02/27/2022   CAP (community acquired pneumonia) 12/07/2021   Acute renal failure superimposed on stage 4 chronic kidney disease ([place]) 09/73/5329   Acute metabolic encephalopathy 92/42/6834   Anemia, unspecified    Nasal congestion 12/18/2018   Depression with anxiety 12/18/2018   Chronic diastolic CHF (congestive heart failure) ([place]) 04/07/2018   HTN (hypertension) 04/07/2018   Chronic right-sided low back pain with right-sided sciatica 10/06/2017   Mild cognitive impairment with memory loss 11/04/2016   Chronic kidney disease (CKD) stage G3a/A2, moderately decreased glomerular filtration rate (GFR) between 45-59 mL/min/1.73 square meter and albuminuria creatinine ratio between 30-299 mg/g ([place]) 11/23/2015   Weight loss 11/23/2015   Obesity (BMI 30-39.9) 02/03/2014   Acute kidney injury superimposed on chronic kidney  disease ([place]) 10/20/2013   Type 2 diabetes mellitus with hyperlipidemia ([place]) 09/17/2013   Insomnia 01/07/2013   DM2 (diabetes mellitus, type 2) ([place]) 12/29/2012   Hypertension associated with diabetes ([place])    COPD (chronic obstructive pulmonary disease) ([place])    Hyperlipidemia    Degenerative drusen 07/07/2012   COPD with asthma ([place]) 12/03/2011   Allergic rhinitis 12/03/2011   Branch retinal vein occlusion 06/28/2011   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    Orientation RESPIRATION BLADDER Height & Weight     Self, Place  O2 (5 liters) Incontinent, External catheter Weight: 151 lb 14.4 oz (68.9 kg) Height:     BEHAVIORAL SYMPTOMS/MOOD NEUROLOGICAL BOWEL NUTRITION STATUS      Continent Diet (See dc summary)  AMBULATORY STATUS COMMUNICATION OF NEEDS Skin   Extensive Assist Verbally Normal                       Personal Care Assistance Level of Assistance  Bathing, Feeding, Dressing Bathing Assistance: Maximum assistance Feeding assistance: Limited assistance Dressing Assistance: Maximum assistance     Functional Limitations Info  Sight, Hearing, Speech Sight Info: Impaired Hearing Info: Impaired Speech Info: Adequate    SPECIAL CARE FACTORS FREQUENCY  PT (By licensed PT), OT (By licensed OT)     PT Frequency: 5xweek OT Frequency: 5xweek            Contractures Contractures Info: Not present    Additional Factors Info  Code Status, Allergies Code Status Info: DNR Allergies Info: Ace Inhibitors           Current Medications (07/24/2022):  This is the current hospital active medication list Current Facility-Administered Medications  Medication Dose Route Frequency Provider Last Rate Last Admin   acetaminophen (TYLENOL) tablet 650 mg  650 mg Oral Q6H PRN Karmen Bongo, MD   650 mg at 07/22/22 1721   Or   acetaminophen (TYLENOL) suppository 650 mg  650 mg Rectal Q6H PRN Karmen Bongo, MD       acetaminophen (TYLENOL) tablet 500 mg  500 mg Oral BID Karmen Bongo, MD   500 mg at 07/23/22 2113   albuterol (PROVENTIL) (2.5 MG/3ML) 0.083% nebulizer solution 2.5 mg  2.5 mg Inhalation Q6H PRN Karmen Bongo, MD       amoxicillin (AMOXIL) capsule 250 mg  250 mg Oral Q12H Arrien, Jimmy Picket, MD   250 mg at 07/23/22 2113   aspirin EC tablet 81 mg  81 mg Oral Daily Karmen Bongo, MD   81 mg at 07/23/22 0852   atorvastatin (LIPITOR) tablet 80 mg  80 mg Oral QPM Karmen Bongo, MD   80 mg at 07/23/22 1653   bisacodyl (DULCOLAX) EC tablet 5 mg  5 mg Oral Daily PRN Karmen Bongo, MD   5 mg at 07/22/22 2152   docusate sodium (COLACE) capsule 100 mg  100 mg Oral BID Karmen Bongo, MD   100 mg at 07/23/22 2114   enoxaparin (LOVENOX) injection 30 mg  30 mg Subcutaneous Q24H Karmen Bongo, MD   30 mg at 07/23/22 1653   feeding supplement (ENSURE ENLIVE / ENSURE PLUS) liquid 237 mL  237 mL Oral BID BM Arrien, Jimmy Picket, MD       ferumoxytol [facility]) 510 mg in sodium chloride 0.9 % 100 mL IVPB  510 mg Intravenous Once Arrien, Jimmy Picket, MD       fluticasone [employer] [facility]) 50 MCG/ACT nasal spray 1 spray  1 spray Each Nare Daily Karmen Bongo, MD   1 spray at 07/23/22 1514   gatifloxacin (ZYMAXID) 0.5 % ophthalmic drops 1 drop  1 drop Right Eye BID Karmen Bongo, MD   1 drop at 07/23/22 2115   guaiFENesin (MUCINEX) 12 hr tablet 1,200 mg  1,200 mg Oral BID PRN Karmen Bongo, MD       insulin aspart (novoLOG) injection 0-9 Units  0-9 Units Subcutaneous TID WC Karmen Bongo, MD   2 Units at 07/23/22 1151   loratadine (CLARITIN) tablet 10 mg  10 mg Oral Daily Karmen Bongo, MD   10 mg at 07/23/22 8416   melatonin tablet 1.5 mg  1.5 mg Oral QHS Karmen Bongo, MD   1.5 mg at 07/23/22 2114   mirtazapine (REMERON) tablet 7.5 mg  7.5 mg Oral Ivery Quale, MD   7.5 mg at 07/23/22 2113   mometasone-formoterol (DULERA) 200-5 MCG/ACT inhaler 2 puff  2 puff Inhalation BID Karmen Bongo, MD   2 puff at 07/23/22 2102   montelukast  (SINGULAIR) tablet 10 mg  10 mg Oral Ivery Quale, MD   10 mg at 07/23/22 2114   multivitamin with minerals tablet 1 tablet  1 tablet Oral Daily Arrien, Jimmy Picket, MD   1 tablet at 07/23/22 1653   ondansetron (ZOFRAN) tablet 4 mg  4 mg Oral Q6H PRN Karmen Bongo, MD   4 mg at 07/22/22 1721   Or   ondansetron (ZOFRAN) injection 4 mg  4 mg Intravenous Q6H PRN Karmen Bongo, MD       oxyCODONE (Oxy IR/ROXICODONE) immediate release tablet 5 mg  5 mg Oral Q4H PRN Karmen Bongo, MD   5 mg at 07/24/22 0136   polyethylene  glycol (MIRALAX / GLYCOLAX) packet 17 g  17 g Oral Daily Karmen Bongo, MD   17 g at 07/23/22 2263   senna-docusate (Senokot-S) tablet 1 tablet  1 tablet Oral BID Karmen Bongo, MD   1 tablet at 07/23/22 2113   sertraline (ZOLOFT) tablet 50 mg  50 mg Oral Daily Karmen Bongo, MD   50 mg at 07/23/22 0851   sodium chloride flush (NS) 0.9 % injection 3 mL  3 mL Intravenous Lillia Mountain, MD   3 mL at 07/23/22 2114     Discharge Medications: Please see discharge summary for a list of discharge medications.  Relevant Imaging Results:  Relevant Lab Results:   Additional Information SSN: [id number]  Beckey Rutter, MSW, [place] Transitions of Care  Clinical Social Worker I

## 2022-07-24 NOTE — Plan of Care (Signed)
  Problem: Coping: ?Goal: Level of anxiety will decrease ?Outcome: Progressing ?  ?Problem: Safety: ?Goal: Ability to remain free from injury will improve ?Outcome: Progressing ?  ?

## 2022-07-24 NOTE — TOC Initial Note (Addendum)
Transition of Care [facility]) - Initial/Assessment Note    Patient Details  Name: Ian [name] MRN: [id number] Date of Birth: 1935-12-30  Transition of Care [facility]) CM/SW Contact:    Bjorn Pippin, LCSW Phone Number: 07/24/2022, 10:24 AM  Clinical Narrative:                 CSW spoke with [facility] regarding pt return. [facility] informed CSW that they do not have PT on site and that pt will need to go to SNF for rehab.   CSW spoke with pt son regarding SNF recommendations. CSW communicated with son that pt would need to go to SNF for rehab before returning to his ALF. Son mentioned that pt previously had went to [facility] and did not want to return there. CSW informed son that she can fax out referrals to multiple facilities in the [place] area and provide family with a listed of SNF's available for pt.   10:30 Nanine Means called CSW to notify her that [facility] from the ALF will come and assess the pt to see if he can be appropriate to receive therapy at the ALF. Nanine Means has spoken with son and now states that they may be able to potential provide therapy at the facility. Nanine Means stated that in order to set PT services up at ALF they will need a order and note from PT.   11:30 CSW notified that pt will likely not be able to tolerate STR and likely transition to hospice. NP informed CSW that she will be in contact with pt son to deliver this information to him.   14:30 CSW contact pt ALF [facility] to learn of which, if any specific hospice agency they use. CSW was informed that pt will likely return to ALF with hospice. CSW left a VM with Joy [phone]).    TOC will continue to follow.   Expected Discharge Plan: Skilled Nursing Facility Barriers to Discharge: Continued Medical Work up   Patient Goals and CMS Choice            Expected Discharge Plan and Services       Living arrangements for the past 2 months: [place]                                       Prior Living Arrangements/Services Living arrangements for the past 2 months: [place]   Patient language and need for interpreter reviewed:: Yes        Need for Family Participation in Patient Care: Yes (Comment)     Criminal Activity/Legal Involvement Pertinent to Current Situation/Hospitalization: No - Comment as needed  Activities of Daily Living      Permission Sought/Granted Permission sought to share information with : Family Supports                Emotional Assessment       Orientation: : Oriented to Self, Oriented to Place   Psych Involvement: No (comment)  Admission diagnosis:  Elevated troponin [R79.89] Acute on chronic diastolic (congestive) heart failure (HCC) [I50.33] Acute on chronic congestive heart failure, unspecified heart failure type ([place]) [I50.9] Patient Active Problem List   Diagnosis Date Noted   Malnutrition of moderate degree 07/23/2022   UTI (urinary tract infection) 07/22/2022   Anemia 07/22/2022   Acute on chronic diastolic (congestive) heart failure ([place]) 07/21/2022   Elevated troponin 07/21/2022   DNR (do  not resuscitate) 07/21/2022   Underweight due to inadequate caloric intake 06/28/2022   Fever 06/28/2022   Pleural effusion 02/27/2022   CAP (community acquired pneumonia) 12/07/2021   Acute renal failure superimposed on stage 4 chronic kidney disease ([place]) 51/89/8421   Acute metabolic encephalopathy 09/16/8116   Anemia, unspecified    Nasal congestion 12/18/2018   Depression with anxiety 12/18/2018   Chronic diastolic CHF (congestive heart failure) ([place]) 04/07/2018   HTN (hypertension) 04/07/2018   Chronic right-sided low back pain with right-sided sciatica 10/06/2017   Mild cognitive impairment with memory loss 11/04/2016   Chronic kidney disease (CKD) stage G3a/A2, moderately decreased glomerular filtration rate (GFR) between 45-59 mL/min/1.73 square meter and albuminuria creatinine ratio between 30-299  mg/g ([place]) 11/23/2015   Weight loss 11/23/2015   Obesity (BMI 30-39.9) 02/03/2014   Acute kidney injury superimposed on chronic kidney disease ([place]) 10/20/2013   Type 2 diabetes mellitus with hyperlipidemia ([place]) 09/17/2013   Insomnia 01/07/2013   DM2 (diabetes mellitus, type 2) ([place]) 12/29/2012   Hypertension associated with diabetes ([place])    COPD (chronic obstructive pulmonary disease) ([place])    Hyperlipidemia    Degenerative drusen 07/07/2012   COPD with asthma ([place]) 12/03/2011   Allergic rhinitis 12/03/2011   Branch retinal vein occlusion 06/28/2011   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 [place] [address] Phone: [phone] Fax: [phone]  [place], [place] - [address] [place] [address] Phone: [phone] Fax: [phone]     Social Determinants of Health (SDOH) Social History: [place]: No Food Insecurity (07/02/2017)  Transportation Needs: No Transportation Needs (07/02/2017)  Alcohol Screen: Low Risk  (07/16/2018)  Depression (PHQ2-9): Low Risk  (01/11/2022)  Financial Resource Strain: Low Risk  (07/02/2017)  Physical Activity: Unknown (07/06/2018)  Social Connections: Socially Integrated (07/02/2017)  Stress: No Stress Concern Present (07/02/2017)  Tobacco Use: Medium Risk (07/21/2022)   SDOH Interventions:     Readmission Risk Interventions     No data to display          Beckey Rutter, MSW, LCSWA, LCASA Transitions of Care  Clinical Social Worker I

## 2022-07-24 NOTE — Progress Notes (Signed)
Placed on comfort care.

## 2022-07-24 NOTE — Progress Notes (Signed)
Albumin given for asymptomatic hypotension.

## 2022-07-24 NOTE — Telephone Encounter (Signed)
Spoke with pt's son [name] and addressed what I had learned about his hospitalization and my agreement with the [facility] Palliative Provider that he is a good candidate for hospice support given the speed and degree of his decline.  He states that they were just not expecting to hear it today but thinks they do want to do Hospice at [facility] and that their father has been talking about being ready. Offered therapeutic listening and advised he can call anytime if there is some way I can assist.  Damaris Hippo FNP-C

## 2022-07-25 DIAGNOSIS — I5033 Acute on chronic diastolic (congestive) heart failure: Secondary | ICD-10-CM | POA: Diagnosis not present

## 2022-07-25 DIAGNOSIS — Z515 Encounter for palliative care: Secondary | ICD-10-CM | POA: Diagnosis not present

## 2022-07-25 DIAGNOSIS — R7989 Other specified abnormal findings of blood chemistry: Secondary | ICD-10-CM | POA: Diagnosis not present

## 2022-07-25 DIAGNOSIS — I509 Heart failure, unspecified: Secondary | ICD-10-CM | POA: Diagnosis not present

## 2022-07-25 MED ORDER — HYDROMORPHONE HCL 1 MG/ML PO LIQD: 1.0000 mg | ORAL | 0 refills | Status: AC | PRN

## 2022-07-25 MED ORDER — LORAZEPAM 1 MG PO TABS: 1.0000 mg | ORAL_TABLET | ORAL | 0 refills | Status: AC | PRN

## 2022-07-25 NOTE — Progress Notes (Signed)
Pt discharged to [facility] in stable condition via PTAR with transporters x 2 at cart side. Both pt's sons present at bedside as well.

## 2022-07-25 NOTE — Progress Notes (Signed)
   Currently on comfort care measures.  No changes made in management.  Will be going to skilled nursing facility on hospice we will go ahead and sign off.  Please let us know if we can be of further assistance.  Candee Furbish, MD

## 2022-07-25 NOTE — Care Management Important Message (Signed)
Important Message  Patient Details  Name: Ian [name] MRN: [id number] Date of Birth: Aug 01, 1935   Medicare Important Message Given:  Yes     Shelda Altes 07/25/2022, 10:08 AM

## 2022-07-25 NOTE — NC FL2 (Signed)
[place] LEVEL OF CARE FORM     IDENTIFICATION  Patient Name: Ian [name] Birthdate: [birth year] Sex: male Admission Date (Current Location): 07/21/2022  [facility] and [place] Number:  Herbalist and Address:  [facility] [place], [place] [address] [place], [place] [address]      Provider Number: [id number]  Attending Physician Name and Address:  Domenic Polite, MD  Relative Name and Phone Number:  [name] [phone])    Current Level of Care: Hospital Recommended Level of Care: [place] Prior Approval Number:    Date Approved/Denied:   PASRR Number: [id number]  Discharge Plan: Other (Comment) (ALF)    Current Diagnoses: Patient Active Problem List   Diagnosis Date Noted   Malnutrition of moderate degree 07/23/2022   UTI (urinary tract infection) 07/22/2022   Anemia 07/22/2022   Acute on chronic diastolic (congestive) heart failure ([place]) 07/21/2022   Elevated troponin 07/21/2022   DNR (do not resuscitate) 07/21/2022   Underweight due to inadequate caloric intake 06/28/2022   Fever 06/28/2022   Pleural effusion 02/27/2022   CAP (community acquired pneumonia) 12/07/2021   Acute renal failure superimposed on stage 4 chronic kidney disease ([place]) 63/89/3734   Acute metabolic encephalopathy 28/76/8115   Anemia, unspecified    Nasal congestion 12/18/2018   Depression with anxiety 12/18/2018   Chronic diastolic CHF (congestive heart failure) ([place]) 04/07/2018   HTN (hypertension) 04/07/2018   Chronic right-sided low back pain with right-sided sciatica 10/06/2017   Mild cognitive impairment with memory loss 11/04/2016   Chronic kidney disease (CKD) stage G3a/A2, moderately decreased glomerular filtration rate (GFR) between 45-59 mL/min/1.73 square meter and albuminuria creatinine ratio between 30-299 mg/g ([place]) 11/23/2015   Weight loss 11/23/2015   Obesity (BMI 30-39.9) 02/03/2014   Acute kidney injury superimposed on  chronic kidney disease ([place]) 10/20/2013   Type 2 diabetes mellitus with hyperlipidemia ([place]) 09/17/2013   Insomnia 01/07/2013   DM2 (diabetes mellitus, type 2) ([place]) 12/29/2012   Hypertension associated with diabetes ([place])    COPD (chronic obstructive pulmonary disease) ([place])    Hyperlipidemia    Degenerative drusen 07/07/2012   COPD with asthma ([place]) 12/03/2011   Allergic rhinitis 12/03/2011   Branch retinal vein occlusion 06/28/2011   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    Orientation RESPIRATION BLADDER Height & Weight     Self, Place  O2 (3 liters) Incontinent, External catheter Weight: 154 lb 8.7 oz (70.1 kg) Height:     BEHAVIORAL SYMPTOMS/MOOD NEUROLOGICAL BOWEL NUTRITION STATUS      Continent Diet (See dc summary)  AMBULATORY STATUS COMMUNICATION OF NEEDS Skin   Extensive Assist Verbally Normal                       Personal Care Assistance Level of Assistance  Bathing, Feeding, Dressing Bathing Assistance: Maximum assistance Feeding assistance: Limited assistance Dressing Assistance: Maximum assistance     Functional Limitations Info  Sight, Hearing, Speech Sight Info: Impaired Hearing Info: Impaired Speech Info: Adequate    SPECIAL CARE FACTORS FREQUENCY  PT (By licensed PT), OT (By licensed OT)     PT Frequency: 5xweek OT Frequency: 5xweek            Contractures Contractures Info: Not present    Additional Factors Info  Code Status Code Status Info: DNR Allergies Info: ACe Inhibitors           Current Medications (07/25/2022):  This is the current hospital active medication list Current Facility-Administered  Medications  Medication Dose Route Frequency Provider Last Rate Last Admin   acetaminophen (TYLENOL) tablet 650 mg  650 mg Oral Q6H PRN Karmen Bongo, MD   650 mg at 07/25/22 3716   Or   acetaminophen (TYLENOL) suppository 650 mg  650 mg Rectal Q6H PRN Karmen Bongo, MD       antiseptic oral rinse (BIOTENE) solution 15 mL  15 mL  Topical BID Lin Landsman, NP   15 mL at 07/25/22 1001   diphenhydrAMINE (BENADRYL) injection 12.5 mg  12.5 mg Intravenous Q4H PRN Lin Landsman, NP       gatifloxacin (ZYMAXID) 0.5 % ophthalmic drops 1 drop  1 drop Right Eye BID Karmen Bongo, MD   1 drop at 07/25/22 1002   glycopyrrolate (ROBINUL) tablet 1 mg  1 mg Oral Q4H PRN Lin Landsman, NP       Or   glycopyrrolate (ROBINUL) injection 0.2 mg  0.2 mg Subcutaneous Q4H PRN Lin Landsman, NP       Or   glycopyrrolate (ROBINUL) injection 0.2 mg  0.2 mg Intravenous Q4H PRN Lin Landsman, NP       haloperidol (HALDOL) tablet 2 mg  2 mg Oral Q6H PRN Lin Landsman, NP       Or   haloperidol (HALDOL) 2 MG/ML solution 2 mg  2 mg Sublingual Q6H PRN Lin Landsman, NP       Or   haloperidol lactate (HALDOL) injection 2 mg  2 mg Intravenous Q6H PRN Lin Landsman, NP       HYDROmorphone HCl (DILAUDID) liquid 1 mg  1 mg Oral Q2H PRN Lin Landsman, NP       LORazepam (ATIVAN) tablet 1 mg  1 mg Oral Q1H PRN Lin Landsman, NP   1 mg at 07/25/22 0648   ondansetron (ZOFRAN) tablet 4 mg  4 mg Oral Q6H PRN Karmen Bongo, MD   4 mg at 07/22/22 1721   Or   ondansetron (ZOFRAN) injection 4 mg  4 mg Intravenous Q6H PRN Karmen Bongo, MD       polyvinyl alcohol (LIQUIFILM TEARS) 1.4 % ophthalmic solution 1 drop  1 drop Both Eyes QID PRN Lin Landsman, NP         Discharge Medications: Please see discharge summary for a list of discharge medications.  Relevant Imaging Results:  Relevant Lab Results:   Additional Information SSN: [id number]  Beckey Rutter, MSW, [place] Transitions of Care  Clinical Social Worker I

## 2022-07-25 NOTE — Discharge Summary (Signed)
Physician Discharge Summary  Jon Lall TKZ:[id number] DOB: May 25, 1936 DOA: 07/21/2022  PCP: Lauree Chandler, NP  Admit date: 07/21/2022 Discharge date: 07/25/2022  Time spent: 45 minutes  Recommendations for Outpatient Follow-up:  Back to ALF with  hospice services   Discharge Diagnoses:  Principal Problem:   Acute on chronic diastolic (congestive) heart failure (HCC)   Acute kidney injury superimposed on chronic kidney disease (HCC) Hypotension Suspected dementia Delirium   HTN (hypertension)   COPD (chronic obstructive pulmonary disease) ([place])   Type 2 diabetes mellitus with hyperlipidemia ([place])   DNR (do not resuscitate)   UTI (urinary tract infection)   Anemia   Malnutrition of moderate degree   Discharge Condition: Guarded  Diet recommendation: Comfort feeds  Filed Weights   07/23/22 0528 07/25/22 0514  Weight: 68.9 kg 70.1 kg    History of present illness:  87/M with history of CKD4, COPD, hypertension, cognitive deficits and dyslipidemia who presented with dyspnea, chest pain and 02 desaturation. Apparently patient had urinary symptom for the last 3 to 4 days, associated with confusion. Symptoms progressed > dyspnea, bradycardia, chest tightness and low 02 saturation, EMS was called. Sats in 80's, NRM, in ED, noted to have trace edema, bun 72 cr 3,58, BNP >4,500, Hs troponin 569, 668 UA w/21-50 wbc, small leukocytes. CXR w/cardiomegaly, with bilateral hilar vascular congestion and small bilateral pleural effusions, -Cardiology consulted, diuresed with IV Lasix, hospital course complicated by worsening AKI on CKD 4/5 and dementia/delirium -Prognosis felt to be poor, palliative care recommended  Hospital Course:   Acute on chronic diastolic (congestive) heart failure ([place]) Echo-2023 with preserved LV systolic function and mild aortic stenosis.  -Followed by cardiology, diuresed w/ IV lasix, then complicated by hypotension and worsening kidney disease -GDMT  limited by CKD -Cards recommended Palliative care w/ advanced age, dementia, worsening AKi on CKD4, he is not felt to be a dialysis candidate -Had palliative care meeting yesterday, decision made for discharge back to ALF with hospice services for symptom and comfort focused care   AKi on CKD4 -baseline creat 2.6-3.2 -worsened now, cardiorenal syndrome -not an HD candidate, creatinine continues to trend up, 4.5 yesterday with BUN of 86, prognosis is very poor -Palliative care consulted, discharged back with hospice   COPD (chronic obstructive pulmonary disease) (HCC) -stable,    Type 2 diabetes mellitus with hyperlipidemia (HCC) -stable CBGs, SSI   UTI (urinary tract infection) Positive pyuria and urinary symptoms, treated with amoxicillin Urine culture positive for enterococcus   Dementia/Cognitive deficits Delirium   Anemia Thrombocytopenia -due to CKD4 and Iron defi-noted on anemia panel      Discharge Exam: Vitals:   07/25/22 0514 07/25/22 0743  BP: (!) 118/57 (!) 147/70  Pulse: 85 73  Resp: (!) 28 20  Temp: 97.7 F (36.5 C) 98.4 F (36.9 C)  SpO2: 92% 96%   Gen: Somnolent, easily arousable, oriented to self and partly to place, cognitive deficits Lungs: Decreased breath sounds at the bases CVS: S1S2/RRR Abd: soft, Non tender, non distended, BS present Extremities: Trace edema  Discharge Instructions   Discharge Instructions     Increase activity slowly   Complete by: As directed       Allergies as of 07/25/2022       Reactions   Ace Inhibitors Cough        Medication List     STOP taking these medications    albuterol 108 (90 Base) MCG/ACT inhaler Commonly known as: VENTOLIN HFA   atorvastatin 80 MG  tablet Commonly known as: LIPITOR   Besivance 0.6 % Susp Generic drug: Besifloxacin HCl   blood glucose meter kit and supplies Kit   fluticasone 50 MCG/ACT nasal spray Commonly known as: FLONASE   glucose blood test strip Commonly  known as: ONE TOUCH ULTRA TEST   guaiFENesin 600 MG 12 hr tablet Commonly known as: Mucinex   hydrALAZINE 25 MG tablet Commonly known as: APRESOLINE   ipratropium-albuterol 0.5-2.5 (3) MG/3ML Soln Commonly known as: DUONEB   loratadine 10 MG tablet Commonly known as: CLARITIN   Melatonin 1 MG Caps   metoprolol tartrate 50 MG tablet Commonly known as: LOPRESSOR   montelukast 10 MG tablet Commonly known as: SINGULAIR   multivitamin with minerals Tabs tablet   ONE TOUCH DELICA LANCING DEV Misc   OXYGEN   PRESERVISION AREDS PO   senna-docusate 8.6-50 MG tablet Commonly known as: Senokot-S   sodium chloride 0.65 % Soln nasal spray Commonly known as: OCEAN   Symbicort 160-4.5 MCG/ACT inhaler Generic drug: budesonide-formoterol   trimethoprim-polymyxin b ophthalmic solution Commonly known as: POLYTRIM   Vitamin D (Ergocalciferol) 1.25 MG (50000 UNIT) Caps capsule Commonly known as: DRISDOL   Zinc Oxide 12 % Crea       TAKE these medications    acetaminophen 500 MG tablet Commonly known as: TYLENOL Take 500 mg by mouth 2 (two) times daily. What changed: Another medication with the same name was removed. Continue taking this medication, and follow the directions you see here.   Aspirin Low Dose 81 MG tablet Generic drug: aspirin EC TAKE 1 TABLET BY MOUTH EVERY DAY --SWALLOW WHOLE What changed: See the new instructions.   HYDROmorphone HCl 1 MG/ML Liqd Commonly known as: DILAUDID Take 1 mL (1 mg total) by mouth every 4 (four) hours as needed for severe pain or moderate pain (shortness of breath, increased work of breathing, RR >25, distress).   LORazepam 1 MG tablet Commonly known as: ATIVAN Take 1 tablet (1 mg total) by mouth every 4 (four) hours as needed for anxiety, seizure or sleep (distress).   mirtazapine 7.5 MG tablet Commonly known as: REMERON Take 1 tablet (7.5 mg total) by mouth at bedtime.   polyethylene glycol 17 g packet Commonly known  as: MIRALAX / GLYCOLAX Take 17 g by mouth daily.   sertraline 50 MG tablet Commonly known as: ZOLOFT Take 50 mg by mouth daily.   torsemide 20 MG tablet Commonly known as: DEMADEX Take 1 tablet (20 mg total) by mouth daily.       Allergies  Allergen Reactions   Ace Inhibitors Cough      The results of significant diagnostics from this hospitalization (including imaging, microbiology, ancillary and laboratory) are listed below for reference.    Significant Diagnostic Studies: ECHOCARDIOGRAM COMPLETE  Result Date: 07/22/2022    ECHOCARDIOGRAM REPORT   Patient Name:   LEGRANDE [name] Date of Exam: 07/22/2022 Medical Rec #:  [id number]  Height:       66.0 in Accession #:    [id number] Weight:       148.0 lb Date of Birth:  08/04/1935  BSA:          1.760 m Patient Age:    24 years   BP:           131/59 mmHg Patient Gender: M          HR:           53 bpm. Exam Location:  Inpatient Procedure: 2D Echo, Cardiac  Doppler and Color Doppler Indications:    CHF-Acute Systolic 732.20/U54.27  History:        Patient has prior history of Echocardiogram examinations, most                 recent 12/13/2021. CHF, COPD; Risk Factors:Hypertension, Diabetes                 and Dyslipidemia. CKD, stage III.  Sonographer:    Ronny Flurry Referring Phys: [place]  1. Left ventricular ejection fraction, by estimation, is 55 to 60%. The left ventricle has normal function. The left ventricle has no regional wall motion abnormalities. There is mild left ventricular hypertrophy. Left ventricular diastolic parameters are consistent with Grade I diastolic dysfunction (impaired relaxation). Elevated left ventricular end-diastolic pressure.  2. Right ventricular systolic function is normal. The right ventricular size is normal.  3. Left atrial size was severely dilated.  4. The mitral valve is degenerative. Trivial mitral valve regurgitation. No evidence of mitral stenosis. Moderate mitral annular  calcification.  5. Gradients similar to TTE done 12/13/21 . The aortic valve is normal in structure. Aortic valve regurgitation is trivial. Moderate aortic valve stenosis.  6. The inferior vena cava is dilated in size with >50% respiratory variability, suggesting right atrial pressure of 8 mmHg. FINDINGS  Left Ventricle: Left ventricular ejection fraction, by estimation, is 55 to 60%. The left ventricle has normal function. The left ventricle has no regional wall motion abnormalities. The left ventricular internal cavity size was normal in size. There is  mild left ventricular hypertrophy. Left ventricular diastolic parameters are consistent with Grade I diastolic dysfunction (impaired relaxation). Elevated left ventricular end-diastolic pressure. Right Ventricle: The right ventricular size is normal. No increase in right ventricular wall thickness. Right ventricular systolic function is normal. Left Atrium: Left atrial size was severely dilated. Right Atrium: Right atrial size was normal in size. Pericardium: There is no evidence of pericardial effusion. Mitral Valve: The mitral valve is degenerative in appearance. There is moderate thickening of the mitral valve leaflet(s). There is moderate calcification of the mitral valve leaflet(s). Moderate mitral annular calcification. Trivial mitral valve regurgitation. No evidence of mitral valve stenosis. Tricuspid Valve: The tricuspid valve is normal in structure. Tricuspid valve regurgitation is mild . No evidence of tricuspid stenosis. Aortic Valve: Gradients similar to TTE done 12/13/21. The aortic valve is normal in structure. Aortic valve regurgitation is trivial. Moderate aortic stenosis is present. Aortic valve mean gradient measures 15.3 mmHg. Aortic valve peak gradient measures 27.7 mmHg. Aortic valve area, by VTI measures 1.18 cm. Pulmonic Valve: The pulmonic valve was normal in structure. Pulmonic valve regurgitation is mild. No evidence of pulmonic stenosis.  Aorta: The aortic root is normal in size and structure. Venous: The inferior vena cava is dilated in size with greater than 50% respiratory variability, suggesting right atrial pressure of 8 mmHg. IAS/Shunts: No atrial level shunt detected by color flow Doppler.  LEFT VENTRICLE PLAX 2D LVIDd:         5.50 cm   Diastology LVIDs:         3.80 cm   LV e' medial:    3.07 cm/s LV PW:         1.20 cm   LV E/e' medial:  32.9 LV IVS:        1.30 cm   LV e' lateral:   4.56 cm/s LVOT diam:     2.00 cm   LV E/e' lateral: 22.1 LV SV:  76 LV SV Index:   43 LVOT Area:     3.14 cm  RIGHT VENTRICLE             IVC RV S prime:     13.40 cm/s  IVC diam: 2.30 cm TAPSE (M-mode): 1.9 cm LEFT ATRIUM             Index        RIGHT ATRIUM           Index LA diam:        5.00 cm 2.84 cm/m   RA Area:     19.90 cm LA Vol (A2C):   78.4 ml 44.56 ml/m  RA Volume:   64.95 ml  36.91 ml/m LA Vol (A4C):   89.2 ml 50.69 ml/m LA Biplane Vol: 89.0 ml 50.58 ml/m  AORTIC VALVE AV Area (Vmax):    1.24 cm AV Area (Vmean):   1.20 cm AV Area (VTI):     1.18 cm AV Vmax:           263.00 cm/s AV Vmean:          183.000 cm/s AV VTI:            0.645 m AV Peak Grad:      27.7 mmHg AV Mean Grad:      15.3 mmHg LVOT Vmax:         103.80 cm/s LVOT Vmean:        70.133 cm/s LVOT VTI:          0.242 m LVOT/AV VTI ratio: 0.37  AORTA Ao Root diam: 3.50 cm Ao Asc diam:  3.20 cm MITRAL VALVE                TRICUSPID VALVE MV Area (PHT): 2.83 cm     TR Peak grad:   49.6 mmHg MV Decel Time: 268 msec     TR Vmax:        352.00 cm/s MV E velocity: 101.00 cm/s MV A velocity: 112.00 cm/s  SHUNTS MV E/A ratio:  0.90         Systemic VTI:  0.24 m                             Systemic Diam: 2.00 cm Jenkins Rouge MD Electronically signed by Jenkins Rouge MD Signature Date/Time: 07/22/2022/5:15:36 PM    Final    DG Chest Port 1 View  Result Date: 07/21/2022 CLINICAL DATA:  sob EXAM: PORTABLE CHEST 1 VIEW COMPARISON:  February 04, 2022 FINDINGS: The cardiomediastinal  silhouette is unchanged in contour. Small bilateral pleural effusions, similar in comparison to prior. No pneumothorax. Mild diffuse interstitial prominence and vascular congestion. Atherosclerotic calcifications. Visualized abdomen is unremarkable. IMPRESSION: Constellation of findings are favored to reflect pulmonary edema with small bilateral pleural effusions. Electronically Signed   By: Valentino Saxon M.D.   On: 07/21/2022 13:24    Microbiology: Recent Results (from the past 240 hour(s))  Resp panel by RT-PCR (RSV, Flu A&B, Covid) Anterior Nasal Swab     Status: None   Collection Time: 07/21/22  5:15 PM   Specimen: Anterior Nasal Swab  Result Value Ref Range Status   SARS Coronavirus 2 by RT PCR NEGATIVE NEGATIVE Final    Comment: (NOTE) SARS-CoV-2 target nucleic acids are NOT DETECTED.  The SARS-CoV-2 RNA is generally detectable in upper respiratory specimens during the acute phase of infection. The lowest concentration of SARS-CoV-2  viral copies this assay can detect is 138 copies/mL. A negative result does not preclude SARS-Cov-2 infection and should not be used as the sole basis for treatment or other patient management decisions. A negative result may occur with  improper specimen collection/handling, submission of specimen other than nasopharyngeal swab, presence of viral mutation(s) within the areas targeted by this assay, and inadequate number of viral copies(<138 copies/mL). A negative result must be combined with clinical observations, patient history, and epidemiological information. The expected result is Negative.  Fact Sheet for Patients:  EntrepreneurPulse.com.au  Fact Sheet for Healthcare Providers:  IncredibleEmployment.be  This test is no t yet approved or cleared by the [place] FDA and  has been authorized for detection and/or diagnosis of SARS-CoV-2 by FDA under an Emergency Use Authorization (EUA). This EUA will  remain  in effect (meaning this test can be used) for the duration of the COVID-19 declaration under Section 564(b)(1) of the Act, 21 U.S.C.section 360bbb-3(b)(1), unless the authorization is terminated  or revoked sooner.       Influenza A by PCR NEGATIVE NEGATIVE Final   Influenza B by PCR NEGATIVE NEGATIVE Final    Comment: (NOTE) The Xpert Xpress SARS-CoV-2/FLU/RSV plus assay is intended as an aid in the diagnosis of influenza from Nasopharyngeal swab specimens and should not be used as a sole basis for treatment. Nasal washings and aspirates are unacceptable for Xpert Xpress SARS-CoV-2/FLU/RSV testing.  Fact Sheet for Patients: EntrepreneurPulse.com.au  Fact Sheet for Healthcare Providers: IncredibleEmployment.be  This test is not yet approved or cleared by the [place] FDA and has been authorized for detection and/or diagnosis of SARS-CoV-2 by FDA under an Emergency Use Authorization (EUA). This EUA will remain in effect (meaning this test can be used) for the duration of the COVID-19 declaration under Section 564(b)(1) of the Act, 21 U.S.C. section 360bbb-3(b)(1), unless the authorization is terminated or revoked.     Resp Syncytial Virus by PCR NEGATIVE NEGATIVE Final    Comment: (NOTE) Fact Sheet for Patients: EntrepreneurPulse.com.au  Fact Sheet for Healthcare Providers: IncredibleEmployment.be  This test is not yet approved or cleared by the [place] FDA and has been authorized for detection and/or diagnosis of SARS-CoV-2 by FDA under an Emergency Use Authorization (EUA). This EUA will remain in effect (meaning this test can be used) for the duration of the COVID-19 declaration under Section 564(b)(1) of the Act, 21 U.S.C. section 360bbb-3(b)(1), unless the authorization is terminated or revoked.  Performed at [facility], [place] [address], [place], [place] [address]    Urine Culture     Status: Abnormal   Collection Time: 07/21/22  5:50 PM   Specimen: Urine, Clean Catch  Result Value Ref Range Status   Specimen Description URINE, CLEAN CATCH  Final   Special Requests   Final    NONE Performed at [facility], [place] [address], [place], [place] [address]    Culture 50,000 COLONIES/mL ENTEROCOCCUS FAECALIS (A)  Final   Report Status 07/23/2022 FINAL  Final   Organism ID, Bacteria ENTEROCOCCUS FAECALIS (A)  Final      Susceptibility   Enterococcus faecalis - MIC*    AMPICILLIN <=2 SENSITIVE Sensitive     NITROFURANTOIN <=16 SENSITIVE Sensitive     VANCOMYCIN 1 SENSITIVE Sensitive     * 50,000 COLONIES/mL ENTEROCOCCUS FAECALIS     Labs: Basic Metabolic Panel: Recent Labs  Lab 07/21/22 1255 07/22/22 0757 07/23/22 0053 07/24/22 0058  NA 141 142 142 137  K 4.6 4.1 5.0 5.3*  CL 111 112* 105 103  CO2 20* 21* 26 25  GLUCOSE 118* 108* 156* 107*  BUN 72* 76* 83* 86*  CREATININE 3.58* 3.65* 4.08* 4.55*  CALCIUM 9.6 9.6 9.6 9.2   Liver Function Tests: No results for input(s): "AST", "ALT", "ALKPHOS", "BILITOT", "PROT", "ALBUMIN" in the last 168 hours. No results for input(s): "LIPASE", "AMYLASE" in the last 168 hours. No results for input(s): "AMMONIA" in the last 168 hours. CBC: Recent Labs  Lab 07/21/22 1255 07/22/22 0757 07/23/22 0053  WBC 7.2 6.5 8.1  NEUTROABS 6.3  --   --   HGB 7.5* 7.3* 8.1*  HCT 24.9* 23.4* 25.9*  MCV 111.2* 109.9* 108.8*  PLT 128* 118* 142*   Cardiac Enzymes: No results for input(s): "CKTOTAL", "CKMB", "CKMBINDEX", "TROPONINI" in the last 168 hours. BNP: BNP (last 3 results) Recent Labs    12/07/21 2301 03/18/22 1127 07/21/22 1255  BNP 726.1* 664* >4,500.0*    ProBNP (last 3 results) No results for input(s): "PROBNP" in the last 8760 hours.  CBG: Recent Labs  Lab 07/23/22 1535 07/23/22 2024 07/24/22 0629 07/24/22 1058 07/24/22 1601  GLUCAP 113* 116* 129* 121* 109*        Signed:  Domenic Polite MD.  [facility] 07/25/2022, 11:27 AM

## 2022-07-25 NOTE — Progress Notes (Signed)
Daily Progress Note   Patient Name: Ian [name]       Date: 07/25/2022 DOB: [birth year]  Age: 87 y.o. MRN#: [id number] Attending Physician: Domenic Polite, MD Primary Care Physician: Lauree Chandler, NP Admit Date: 07/21/2022  Reason for Consultation/Follow-up: Non pain symptom management, Pain control, Psychosocial/spiritual support, and Terminal Care  Subjective: I have reviewed medical records including EPIC notes and labs. Received report from primary RN -  no acute concerns. RN reports patient was restless/agitated earlier this morning with relief from administration of tylenol and ativan. RN noted that Family just stepped out of the hospital to get patient food - they request to speak with me per RN. RN will notify me when family returns.  12:42 PM Received notification family had returned.  Went to visit patient at bedside - Ian [name] and Ian [name] present. Patient was lying in bed asleep - I did not attempt to wake him to preserve comfort; however, he was intermittently awake/asleep during visit. No signs or non-verbal gestures of pain or discomfort noted. No respiratory distress, increased work of breathing, or secretions noted. He is on 3L O2 [place].   Emotional support provided to sons. Education provided on the natural trajectory at EOL as well as what to expect as kidney function worsens. Reviewed transportation plan per their request. Also answered questions around philosophy of use of comfort medications. Family explain that patient's oral intake is now minimal and they recognize his quick decline. Prognostication reviewed.  Confirmed plan is for patient's discharge back to [facility] with hospice today.  All questions and concerns addressed. Encouraged to call with questions and/or  concerns. PMT card provided.  Length of Stay: 4  Current Medications: Scheduled Meds:   antiseptic oral rinse  15 mL Topical BID   gatifloxacin  1 drop Right Eye BID    Continuous Infusions:   PRN Meds: acetaminophen **OR** acetaminophen, diphenhydrAMINE, glycopyrrolate **OR** glycopyrrolate **OR** glycopyrrolate, haloperidol **OR** haloperidol **OR** haloperidol lactate, HYDROmorphone HCl, LORazepam **OR** [DISCONTINUED] LORazepam, ondansetron **OR** ondansetron (ZOFRAN) IV, polyvinyl alcohol  Physical Exam Vitals and nursing note reviewed.  Constitutional:      General: He is not in acute distress.    Appearance: He is cachectic. He is ill-appearing.  Pulmonary:     Effort: No respiratory distress.  Skin:    General: Skin is warm and dry.  Neurological:     Mental Status: He is lethargic.     Motor: Weakness present.             Vital Signs: BP (!) 147/70 (BP Location: Right Arm)   Pulse 73   Temp 98.4 F (36.9 C) (Oral)   Resp 20   Wt 70.1 kg   SpO2 96%   BMI 24.94 kg/m  SpO2: SpO2: 96 % O2 Device: O2 Device: Nasal Cannula O2 Flow Rate: O2 Flow Rate (L/min): 6 L/min  Intake/output summary:  Intake/Output Summary (Last 24 hours) at 07/25/2022 1111 Last data filed at 07/25/2022 1655 Gross per 24 hour  Intake 537 ml  Output 400 ml  Net 137 ml   LBM: Last BM Date : 07/18/22 Baseline Weight: Weight: 68.9 kg Most recent weight: Weight: 70.1 kg       Palliative Assessment/Data: PPS 10-20%      Patient Active Problem List   Diagnosis Date Noted   Malnutrition of moderate degree 07/23/2022   UTI (urinary tract infection) 07/22/2022   Anemia 07/22/2022   Acute on chronic diastolic (congestive) heart failure ([place]) 07/21/2022   Elevated troponin 07/21/2022   DNR (do not resuscitate) 07/21/2022   Underweight due to inadequate caloric intake 06/28/2022   Fever 06/28/2022   Pleural effusion 02/27/2022   CAP (community acquired pneumonia) 12/07/2021   Acute  renal failure superimposed on stage 4 chronic kidney disease ([place]) 37/48/2707   Acute metabolic encephalopathy 86/75/4492   Anemia, unspecified    Nasal congestion 12/18/2018   Depression with anxiety 12/18/2018   Chronic diastolic CHF (congestive heart failure) ([place]) 04/07/2018   HTN (hypertension) 04/07/2018   Chronic right-sided low back pain with right-sided sciatica 10/06/2017   Mild cognitive impairment with memory loss 11/04/2016   Chronic kidney disease (CKD) stage G3a/A2, moderately decreased glomerular filtration rate (GFR) between 45-59 mL/min/1.73 square meter and albuminuria creatinine ratio between 30-299 mg/g ([place]) 11/23/2015   Weight loss 11/23/2015   Obesity (BMI 30-39.9) 02/03/2014   Acute kidney injury superimposed on chronic kidney disease ([place]) 10/20/2013   Type 2 diabetes mellitus with hyperlipidemia ([place]) 09/17/2013   Insomnia 01/07/2013   DM2 (diabetes mellitus, type 2) ([place]) 12/29/2012   Hypertension associated with diabetes ([place])    COPD (chronic obstructive pulmonary disease) ([place])    Hyperlipidemia    Degenerative drusen 07/07/2012   COPD with asthma ([place]) 12/03/2011   Allergic rhinitis 12/03/2011   Branch retinal vein occlusion 06/28/2011   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    Palliative Care Assessment & Plan   Patient Profile: 87 y.o. male  with past medical history of dementia, stage 3b/4 CKD, COPD on 2L home O2, DM, HTN, HLD, BPH, and OSA  admitted on 07/21/2022 with SOB.    Patient is followed by outpatient palliative care and currently admitted for acute on chronic CHF. PMT has been consulted to assist with goals of care conversation.  Assessment: Principal Problem:   Acute on chronic diastolic (congestive) heart failure (HCC) Active Problems:   COPD (chronic obstructive pulmonary disease) (HCC)   Type 2 diabetes mellitus with hyperlipidemia (HCC)   Acute kidney injury superimposed on chronic kidney disease (HCC)   HTN (hypertension)   DNR (do not  resuscitate)   UTI (urinary tract infection)   Anemia   Malnutrition of moderate degree   Terminal care  Recommendations/Plan: Continue full comfort measures Continue DNR/DNI as previously documented Patient discharging to [facility] with hospice today Continue current comfort focused medication regimen Nursing to provide frequent  assessments and administer PRN medications as clinically necessary to ensure EOL comfort  PMT will continue to follow peripherally. If there are any imminent needs please call the service directly  Symptom Management Dilaudid PRN pain/dyspnea/increased work of breathing/RR>25 Tylenol PRN pain/fever Biotin twice daily Benadryl PRN itching Robinul PRN secretions Haldol PRN agitation/delirium Ativan PRN anxiety/seizure/sleep/distress Zofran PRN nausea/vomiting Liquifilm Tears PRN dry eye Continue gatifloxacin opth drops daily  Goals of Care and Additional Recommendations: Limitations on Scope of Treatment: Full Comfort Care  Code Status:    Code Status Orders  (From admission, onward)           Start     Ordered   07/24/22 1313  Do not attempt resuscitation (DNR)  Continuous       Question Answer Comment  If patient has no pulse and is not breathing Do Not Attempt Resuscitation   If patient has a pulse and/or is breathing: Medical Treatment Goals COMFORT MEASURES: Keep clean/warm/dry, use medication by any route; positioning, wound care and other measures to relieve pain/suffering; use oxygen, suction/manual treatment of airway obstruction for comfort; do not transfer unless for comfort needs.   Consent: Discussion documented in EHR or advanced directives reviewed      07/24/22 1316           Code Status History     Date Active Date Inactive Code Status Order ID Comments User Context   07/21/2022 1739 07/24/2022 1316 DNR [id number]  Karmen Bongo, MD ED   06/24/2022 1343 07/21/2022 1239 DNR [id number]  Lauree Chandler, NP Outpatient    12/07/2021 2221 12/14/2021 1810 DNR [id number]  Rhetta Mura, DO Inpatient   05/27/2019 1354 12/07/2021 1430 DNR [id number] Discussed with patient in past, order being reentered [facility] Outpatient   04/07/2018 0322 04/09/2018 1422 Full Code [id number]  Ivor Costa, MD ED   12/31/2016 0148 01/06/2017 1819 DNR [id number]  Etta Quill, DO ED   09/17/2013 0859 02/05/2016 1018 DNR [id number] No cpr, defibrillation, intubation, mechanical ventilation [facility] Out[facility]   12/29/2012 0054 01/04/2013 2032 Full Code [id number]  Rise Patience, MD Inpatient       Prognosis:  < 2 weeks  Discharge Planning: [place] with Hospice  Care plan was discussed with primary RN, patient's sons  Thank you for allowing the Palliative Medicine Team to assist in the care of this patient.  Lin Landsman, NP  Please contact Palliative Medicine Team phone at [phone] for questions and concerns.   *Portions of this note are a verbal dictation therefore any spelling and/or grammatical errors are due to the "[employer] [employer]" system interpretation.

## 2022-07-25 NOTE — TOC Transition Note (Addendum)
Transition of Care [facility]) - CM/SW Discharge Note   Patient Details  Name: Ian [name] MRN: [id number] Date of Birth: 10-01-1935  [facility] CM/SW Contact:  Bjorn Pippin, LCSW Phone Number: 07/25/2022, 3:32 PM   Clinical Narrative:    CSW spoke with [place] regarding setting pt up with hospice upon his return to [place]. Hospice was set up and a hospital bed will be delivered to ALF today. CSW communicated all updates with pt son [name].   Patient will DC to: [facility] Anticipated DC date: 07/25/2022 Family notified: [name] (son) Transport by: [name]   Per MD patient ready for DC to [facility] [name]). RN, patient, patient's family, and facility notified of DC. Discharge Summary and FL2 sent to facility. RN to call report prior to discharge [phone]. DC packet on chart. Ambulance transport requested for patient.   CSW will sign off for now as social work intervention is no longer needed. Please consult [place] again if new needs arise.   Final next level of care: [place] Barriers to Discharge: No Barriers Identified   Patient Goals and CMS Choice      Discharge Placement                  Patient to be transferred to facility by: [facility] Name of family member notified: [name] (son) Patient and family notified of of transfer: 07/25/22  Discharge Plan and Services Additional resources added to the After Visit Summary for                                       Social Determinants of Health (SDOH) Interventions SDOH Screenings   Food Insecurity: No Food Insecurity (07/02/2017)  Transportation Needs: No Transportation Needs (07/02/2017)  Alcohol Screen: Low Risk  (07/16/2018)  Depression (PHQ2-9): Low Risk  (01/11/2022)  Financial Resource Strain: Low Risk  (07/02/2017)  Physical Activity: Unknown (07/06/2018)  Social Connections: Socially Integrated (07/02/2017)  Stress: No Stress Concern Present  (07/02/2017)  Tobacco Use: Medium Risk (07/21/2022)     Readmission Risk Interventions     No data to display           Beckey Rutter, MSW, LCSWA, LCASA Transitions of Care  Clinical Social Worker I

## 2022-07-25 NOTE — Progress Notes (Signed)
[facility]) Hospital Liaison Note   Received request from [facility], Beckey Rutter, for hospice services at home after discharge.    Spoke with patient's son, [name] to initiate education related to hospice philosophy, services, and team approach to care. [name] verbalized understanding of information given. Per discussion, the plan is for patient to discharged to [facility] via EMS today.    DME needs discussed. Patient has the following equipment in the home (Purchased privately): None Patient requests the following equipment for delivery: Hospital bed  Address verified and is correct in the chart. [name] is the family member to contact to arrange time of equipment delivery.    Please send signed and completed DNR home with patient/family. Please provide prescriptions at discharge as needed to ensure ongoing symptom management.    [facility] information and contact numbers given to family & above information shared with TOC.   Please call with any questions/concerns.    Thank you for the opportunity to participate in this patient's care.   Zigmund Gottron  [facility] Liaison  [phone]

## 2022-07-31 ENCOUNTER — Encounter: Payer: Self-pay | Admitting: Nurse Practitioner

## 2022-08-07 ENCOUNTER — Ambulatory Visit: Payer: PPO | Admitting: Gastroenterology

## 2022-08-08 DEATH — deceased

## 2022-09-23 ENCOUNTER — Ambulatory Visit: Payer: PPO | Admitting: Nurse Practitioner
